# Patient Record
Sex: Male | Born: 1935
Health system: Southern US, Community
[De-identification: ages and names within clinical notes are randomized; demographics above are authoritative.]

## PROBLEM LIST (undated history)

## (undated) ENCOUNTER — Emergency Department (HOSPITAL_COMMUNITY): Admission: EM | Payer: No Typology Code available for payment source

## (undated) DIAGNOSIS — D649 Anemia, unspecified: Secondary | ICD-10-CM

## (undated) DIAGNOSIS — Z9289 Personal history of other medical treatment: Secondary | ICD-10-CM

## (undated) DIAGNOSIS — I10 Essential (primary) hypertension: Secondary | ICD-10-CM

## (undated) DIAGNOSIS — I739 Peripheral vascular disease, unspecified: Secondary | ICD-10-CM

## (undated) DIAGNOSIS — I2510 Atherosclerotic heart disease of native coronary artery without angina pectoris: Secondary | ICD-10-CM

## (undated) DIAGNOSIS — J301 Allergic rhinitis due to pollen: Secondary | ICD-10-CM

## (undated) DIAGNOSIS — R519 Headache, unspecified: Secondary | ICD-10-CM

## (undated) DIAGNOSIS — R51 Headache: Secondary | ICD-10-CM

## (undated) DIAGNOSIS — E785 Hyperlipidemia, unspecified: Secondary | ICD-10-CM

## (undated) DIAGNOSIS — I6529 Occlusion and stenosis of unspecified carotid artery: Secondary | ICD-10-CM

## (undated) DIAGNOSIS — G43909 Migraine, unspecified, not intractable, without status migrainosus: Secondary | ICD-10-CM

## (undated) DIAGNOSIS — H409 Unspecified glaucoma: Secondary | ICD-10-CM

## (undated) DIAGNOSIS — F1021 Alcohol dependence, in remission: Secondary | ICD-10-CM

## (undated) DIAGNOSIS — B019 Varicella without complication: Secondary | ICD-10-CM

## (undated) DIAGNOSIS — E119 Type 2 diabetes mellitus without complications: Secondary | ICD-10-CM

## (undated) DIAGNOSIS — E039 Hypothyroidism, unspecified: Secondary | ICD-10-CM

## (undated) DIAGNOSIS — N189 Chronic kidney disease, unspecified: Secondary | ICD-10-CM

## (undated) HISTORY — DX: Varicella without complication: B01.9

## (undated) HISTORY — DX: Hyperlipidemia, unspecified: E78.5

## (undated) HISTORY — DX: Migraine, unspecified, not intractable, without status migrainosus: G43.909

## (undated) HISTORY — DX: Essential (primary) hypertension: I10

## (undated) HISTORY — DX: Headache, unspecified: R51.9

## (undated) HISTORY — DX: Allergic rhinitis due to pollen: J30.1

## (undated) HISTORY — DX: Personal history of other medical treatment: Z92.89

## (undated) HISTORY — DX: Headache: R51

## (undated) HISTORY — DX: Unspecified glaucoma: H40.9

## (undated) HISTORY — DX: Occlusion and stenosis of unspecified carotid artery: I65.29

## (undated) HISTORY — DX: Alcohol dependence, in remission: F10.21

## (undated) HISTORY — PX: CHOLECYSTECTOMY, LAPAROSCOPIC: SHX56

---
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
## 1997-11-18 ENCOUNTER — Emergency Department (HOSPITAL_COMMUNITY): Admission: EM | Admit: 1997-11-18 | Discharge: 1997-11-18 | Payer: Self-pay | Admitting: Emergency Medicine

## 1998-06-20 ENCOUNTER — Emergency Department (HOSPITAL_COMMUNITY): Admission: EM | Admit: 1998-06-20 | Discharge: 1998-06-20 | Payer: Self-pay | Admitting: Emergency Medicine

## 1998-06-20 ENCOUNTER — Inpatient Hospital Stay (HOSPITAL_COMMUNITY): Admission: EM | Admit: 1998-06-20 | Discharge: 1998-06-22 | Payer: Self-pay | Admitting: Emergency Medicine

## 1998-06-20 ENCOUNTER — Encounter: Payer: Self-pay | Admitting: Emergency Medicine

## 2012-11-02 DIAGNOSIS — R059 Cough, unspecified: Secondary | ICD-10-CM | POA: Diagnosis not present

## 2012-11-02 DIAGNOSIS — R05 Cough: Secondary | ICD-10-CM | POA: Diagnosis not present

## 2012-11-02 DIAGNOSIS — R0989 Other specified symptoms and signs involving the circulatory and respiratory systems: Secondary | ICD-10-CM | POA: Diagnosis not present

## 2012-11-05 DIAGNOSIS — S1093XA Contusion of unspecified part of neck, initial encounter: Secondary | ICD-10-CM | POA: Diagnosis not present

## 2012-11-05 DIAGNOSIS — IMO0002 Reserved for concepts with insufficient information to code with codable children: Secondary | ICD-10-CM | POA: Diagnosis not present

## 2012-11-05 DIAGNOSIS — S0083XA Contusion of other part of head, initial encounter: Secondary | ICD-10-CM | POA: Diagnosis not present

## 2012-11-05 DIAGNOSIS — S0003XA Contusion of scalp, initial encounter: Secondary | ICD-10-CM | POA: Diagnosis not present

## 2012-11-05 DIAGNOSIS — H8110 Benign paroxysmal vertigo, unspecified ear: Secondary | ICD-10-CM | POA: Diagnosis not present

## 2014-01-11 ENCOUNTER — Encounter: Payer: Self-pay | Admitting: Physician Assistant

## 2014-01-11 ENCOUNTER — Ambulatory Visit (INDEPENDENT_AMBULATORY_CARE_PROVIDER_SITE_OTHER): Payer: Medicare Other | Admitting: Physician Assistant

## 2014-01-11 VITALS — BP 152/70 | HR 88 | Temp 97.5°F | Resp 16 | Ht 65.75 in | Wt 154.2 lb

## 2014-01-11 DIAGNOSIS — Z7689 Persons encountering health services in other specified circumstances: Secondary | ICD-10-CM | POA: Insufficient documentation

## 2014-01-11 DIAGNOSIS — Z7189 Other specified counseling: Secondary | ICD-10-CM

## 2014-01-11 DIAGNOSIS — E785 Hyperlipidemia, unspecified: Secondary | ICD-10-CM

## 2014-01-11 DIAGNOSIS — I10 Essential (primary) hypertension: Secondary | ICD-10-CM | POA: Diagnosis not present

## 2014-01-11 NOTE — Assessment & Plan Note (Addendum)
Initial BP in clinic elevated at 173/82.  Asymptomatic. Repeat BP at end of visit at ---.  Patient currently on a BP supplement that he states his company manufactures.  Refuses Rx medication despite risk for stroke and heart attack.  Is aware this is AMA.

## 2014-01-11 NOTE — Progress Notes (Signed)
   Patient presents to clinic today to establish care.  Patient with history of hypertension and hyperlipidemia controlled with diet, exercise and supplementation.  BP elevated at 173/82 in clinic.  Asymptomatic.  Repeat BP 152/70.  Patient states that he does not need to check his cholesterol. His BP is always good and he will not take prescription medication.  Past Medical History  Diagnosis Date  . HTN (hypertension)   . Hyperlipidemia   . Hyperlipidemia   . Chicken pox   . Frequent headaches   . Glaucoma   . Hay fever   . Migraines   . History of blood transfusion   . 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Past Surgical History  Procedure Laterality Date  . Cholecystectomy, laparoscopic    . Tonsillectomy  1942    No current outpatient prescriptions on file prior to visit.   No current facility-administered medications on file prior to visit.    Allergies  Allergen Reactions  . Onion Other (See Comments)    GI Issues    No family history on file.  History   Social History  . Marital Status: Married    Spouse Name: N/A    Number of Children: N/A  . Years of Education: N/A   Occupational History  . Not on file.   Social History Main Topics  . Smoking status: Former Smoker    Start date: 04/01/1968    Quit date: 01/11/1977  . Smokeless tobacco: Never Used  . Alcohol Use: No  . Drug Use: No  . Sexual Activity: No   Other Topics Concern  . Not on file   Social History Narrative  . No narrative on file   ROS See HPI.  All other ROS are negative.  BP 152/70  Pulse 88  Temp(Src) 97.5 F (36.4 C) (Oral)  Resp 16  Ht 5' 5.75" (1.67 m)  Wt 154 lb 4 oz (69.967 kg)  BMI 25.09 kg/m2  SpO2 98%  Physical Exam  Vitals reviewed. Constitutional: He is oriented to person, place, and time and well-developed, well-nourished, and in no distress.  HENT:  Head: Normocephalic and atraumatic.  Eyes: Conjunctivae are normal.  Neck: Neck supple. No thyromegaly  present.  Cardiovascular: Normal rate, regular rhythm, normal heart sounds and intact distal pulses.   Pulmonary/Chest: Effort normal and breath sounds normal. No respiratory distress. He has no wheezes. He has no rales. He exhibits no tenderness.  Lymphadenopathy:    He has no cervical adenopathy.  Neurological: He is alert and oriented to person, place, and time.  Skin: Skin is warm and dry. No rash noted.  Psychiatric: Affect normal.   Assessment/Plan: Essential hypertension Initial BP in clinic elevated at 173/82.  Asymptomatic. Repeat BP at end of visit at ---.  Patient currently on a BP supplement that he states his company manufactures.  Refuses Rx medication despite risk for stroke and heart attack.  Is aware this is AMA.  Encounter to establish care Medical History reviewed and updated.  Patient on multiple supplements at present.  Supplements are made by his own companies. Refuses Rx medication or therapies despite history of hypertension, hyperlipidemia and glaucoma.  Patient declines immunizations -- TDap, Influenza, Zostavax or Pneumococcal.  Overdue for colonoscopy -- Patient refuses. Is up-to-date on dental care.  Hyperlipidemia Patient endorses history.  Is not taking Rx therapy. Refuses Rx medications.

## 2014-01-11 NOTE — Patient Instructions (Signed)
It was pleasure to participate in your care today. Please return to the office for a Complete Physical Examination.  Reconsider immunizations and Colonoscopy.  Colorectal Cancer Screening Colorectal cancer screening is done to detect early disease. Colorectal refers to the colon and rectum. The colon and rectum are located at the end of the large intestine (digestive system), and carry your bowel movements out of the body. Screening may be done even if you are not experiencing symptoms.  Colorectal cancer screening checks for:  Polyps. These are small growths in the lining of the colon that can turn cancerous.  Cancer that is already growing. Cancer is a cluster of abnormal cells that can cause problems in the body. REASONS FOR COLORECTAL CANCER SCREENING  It is common for polyps to form in the lining of the colon, especially in older people. These polyps can be cancerous or become cancerous.  Caught early, colorectal cancer is treatable.  Cancer can be life threatening. Detecting or preventing cancer early can save your life and allow you to enjoy life longer. TYPES OF SCREENING  Fecal occult blood testing. A stool sample is examined for blood in the laboratory.  Sigmoidoscopy. A sigmoidoscope is used to examine the rectum and lower colon. A sigmoidoscope is a flexible tube with a camera that is inserted through your anus to examine your lower rectum.  Colonoscopy. The longer colonoscope is used to examine the entire colon. A colonoscope is also a thin, flexible tube with a camera. This test examines the colon and rectum. Other tests include:  Digital rectal exam.  Barium enema.  Stool DNA test.  Virtual colonoscopy is the use of computerized X-ray scan (computed tomography, CT) to take X-ray images of your colon. WHO SHOULD HAVE COLORECTAL CANCER SCREENING?  Screening is recommended for all adults aged 21 to 9 years.  Screening is generally done every 5 to 10 years or more  frequently if you have a family history or symptoms.  Screening is rarely recommended in adults aged 71 to 39 years. Screening is not recommended in adults aged 12 years and older. Your caregiver may recommend screening at a younger age and more frequent screening if you have:  A history of colorectal cancer or polyps.  Family members with histories of colorectal cancer or polyps.  Inflammatory bowel disease, such as ulcerative colitis or Crohn's disease.  A type of hereditary colon cancer syndrome. Talk with your caregiver about any symptoms, personal and family history. SYMPTOMS OF COLORECTAL CANCER It is important to discuss the following symptoms with your caregiver. These symptoms may be the result of other conditions and may be easily treated:  Rectal bleeding.  Blood in your stool.  Changes in bowel movements (hard or loose stools). These changes may last several weeks.  Abdominal cramping.  Feeling the pressure to have a bowel movement when there is no bowel movement.  Feeling tired or weak.  Unexplained weight loss.  Unexplained low red blood cell count. This may also be called iron deficiency anemia. HOME CARE INSTRUCTIONS   Follow up with your caregiver as directed.  Follow all instructions for preparation before your test as well as after. PREVENTION  Following healthy lifestyle habits each day can reduce your chance of getting colorectal cancer and many other types of cancer:  Eat a healthy, well-balanced diet rich in fruits and vegetables and low in fats, sugars and cholesterol.  Stay active. Try to exercise at least 4 to 6 times per week for 30 minutes.  Maintain a healthy weight. Ask your caregiver what a healthy weight range is for you.  Women should only drink 1 alcoholic drink per day. Men should only drink 2 alcoholic drinks per day.  Quit smoking. SEEK MEDICAL CARE IF:   You experience abdominal or rectal symptoms (see Symptoms of Colorectal  Cancer).  Your gastrointestinal issues (constipation, diarrhea) do not go away as expected.  You have questions or concerns. FOR MORE INFORMATION  American Academy of Family Physicians www.familydoctor.org  Centers for Disease Control and Prevention http://www.wolf.info/  [place] Preventive Services Task Force www.uspreventiveservicestaskforce.[place] www.cancer.org MAKE SURE YOU:   Understand these instructions.  Will watch your condition.  Will get help right away if you are not doing well or get worse. Always follow up with your caregiver to find out the results of your tests. Not all test results may be available during your visit. If your test results are not back during the visit, make an appointment with your caregiver to find out the results. Do not assume everything is normal if you have not heard from your caregiver or the medical facility. It is important for you to follow up on all of your test results.  Document Released: 09/05/2009 Document Revised: 06/10/2011 Document Reviewed: 06/24/2013 [facility] Patient Information 2015 [place], [place]. This information is not intended to replace advice given to you by your health care provider. Make sure you discuss any questions you have with your health care provider.

## 2014-01-11 NOTE — Assessment & Plan Note (Addendum)
Medical History reviewed and updated.  Patient on multiple supplements at present.  Supplements are made by his own companies. Refuses Rx medication or therapies despite history of hypertension, hyperlipidemia and glaucoma.  Patient declines immunizations -- TDap, Influenza, Zostavax or Pneumococcal.  Overdue for colonoscopy -- Patient refuses. Is up-to-date on dental care.

## 2014-01-11 NOTE — Progress Notes (Signed)
Pre visit review using our clinic review tool, if applicable. No additional management support is needed unless otherwise documented below in the visit note/SLS  

## 2014-01-11 NOTE — Assessment & Plan Note (Signed)
Patient endorses history.  Is not taking Rx therapy. Refuses Rx medications.

## 2014-05-19 DIAGNOSIS — H4321 Crystalline deposits in vitreous body, right eye: Secondary | ICD-10-CM | POA: Diagnosis not present

## 2014-05-19 DIAGNOSIS — H40053 Ocular hypertension, bilateral: Secondary | ICD-10-CM | POA: Diagnosis not present

## 2014-05-19 DIAGNOSIS — H2513 Age-related nuclear cataract, bilateral: Secondary | ICD-10-CM | POA: Diagnosis not present

## 2014-06-30 ENCOUNTER — Ambulatory Visit (INDEPENDENT_AMBULATORY_CARE_PROVIDER_SITE_OTHER): Payer: Medicare Other | Admitting: Medical

## 2014-06-30 ENCOUNTER — Encounter: Payer: Self-pay | Admitting: Medical

## 2014-06-30 VITALS — BP 158/77 | HR 100 | Temp 98.1°F | Ht 65.75 in | Wt 159.2 lb

## 2014-06-30 DIAGNOSIS — J209 Acute bronchitis, unspecified: Secondary | ICD-10-CM | POA: Diagnosis not present

## 2014-06-30 MED ORDER — MOMETASONE FUROATE 50 MCG/ACT NA SUSP: NASAL | Status: DC

## 2014-06-30 MED ORDER — BENZONATATE 100 MG PO CAPS: 100.0000 mg | ORAL_CAPSULE | Freq: Three times a day (TID) | ORAL | Status: DC | PRN

## 2014-06-30 MED ORDER — AZITHROMYCIN 250 MG PO TABS: ORAL_TABLET | ORAL | Status: DC

## 2014-06-30 NOTE — Assessment & Plan Note (Signed)
You appear to have bronchitis. Rest hydrate and tylenol for fever. I am prescribing cough medicine benzonatate, and azithomcyin antibiotic. For your nasal congestion rx of nasonex  You should gradually get better. If not then notify us and would recommend a chest xray.  Follow up in 7-10 days or as needed

## 2014-06-30 NOTE — Patient Instructions (Signed)
Acute bronchitis You appear to have bronchitis. Rest hydrate and tylenol for fever. I am prescribing cough medicine benzonatate, and azithomcyin antibiotic. For your nasal congestion rx of nasonex  You should gradually get better. If not then notify us and would recommend a chest xray.  Follow up in 7-10 days or as needed

## 2014-06-30 NOTE — Progress Notes (Signed)
Pre visit review using our clinic review tool, if applicable. No additional management support is needed unless otherwise documented below in the visit note. 

## 2014-06-30 NOTE — Progress Notes (Signed)
Subjective:    Patient ID: John Walton, male    DOB: 08-04-1932, 81 y.o.   MRN: [id number]  HPI  Pt states he got mild cough about a week ago but it won't go away. Not severe. It is productive. No fever, no chills or sweats. 2 of his grand kids have been sick. No sinus pressure. No wheezing. Pt is not smoker. Stopped smoking 1970.    Review of Systems  Constitutional: Negative for fever, chills and fatigue.  HENT: Positive for congestion.   Respiratory: Positive for cough. Negative for chest tightness, shortness of breath and wheezing.   Cardiovascular: Negative for chest pain and palpitations.  Musculoskeletal: Negative for back pain.  Neurological: Negative for dizziness, syncope, weakness, numbness and headaches.  Hematological: Negative for adenopathy. Does not bruise/bleed easily.  Psychiatric/Behavioral: Negative for behavioral problems and confusion.   Past Medical History  Diagnosis Date  . HTN (hypertension)   . Hyperlipidemia   . Hyperlipidemia   . Chicken pox   . Frequent headaches   . Glaucoma   . Hay fever   . Migraines   . History of blood transfusion   . 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History   Social History  . Marital Status: Married    Spouse Name: N/A  . Number of Children: N/A  . Years of Education: N/A   Occupational History  . Not on file.   Social History Main Topics  . Smoking status: Former Smoker    Start date: 04/01/1968    Quit date: 01/11/1977  . Smokeless tobacco: Never Used  . Alcohol Use: No  . Drug Use: No  . Sexual Activity: No   Other Topics Concern  . Not on file   Social History Narrative    Past Surgical History  Procedure Laterality Date  . Cholecystectomy, laparoscopic    . Tonsillectomy  1942    No family history on file.  Allergies  Allergen Reactions  . Onion Other (See Comments)    GI Issues    Current Outpatient Prescriptions on File Prior to Visit  Medication Sig Dispense Refill  .  NON FORMULARY Take 2-4 capsules by mouth 2 (two) times daily. PROSTATE THERAPY    . NON FORMULARY Take 2 capsules by mouth daily. 8-INGREDIENT THERAPY: BP, STRENGTH IMMUNITY    . NON FORMULARY Take 1 capsule by mouth daily. NERVE TONIC-A: STRESS, COGNITIVE FUNCTION    . NON FORMULARY Apply topically as directed. BPC HEADACHE THERAPY     No current facility-administered medications on file prior to visit.    BP 158/77 mmHg  Pulse 100  Temp(Src) 98.1 F (36.7 C) (Oral)  Ht 5' 5.75" (1.67 m)  Wt 159 lb 3.2 oz (72.213 kg)  BMI 25.89 kg/m2  SpO2 97%      Objective:   Physical Exam  General  Mental Status - Alert. General Appearance - Well groomed. Not in acute distress.  Skin Rashes- No Rashes.  HEENT Head- Normal. Ear Auditory Canal - Left- Normal. Right - Normal.Tympanic Membrane- Left- Normal. Right- Normal. Eye Sclera/Conjunctiva- Left- Normal. Right- Normal. Nose & Sinuses Nasal Mucosa- Left-  Faint boggy +  Congested. Right-  Faint  boggy + Congested. No sinus pressure Mouth & Throat Lips: Upper Lip- Normal: no dryness, cracking, pallor, cyanosis, or vesicular eruption. Lower Lip-Normal: no dryness, cracking, pallor, cyanosis or vesicular eruption. Buccal Mucosa- Bilateral- No Aphthous ulcers. Oropharynx- No Discharge or Erythema. Tonsils: Characteristics- Bilateral- No Erythema or Congestion. Size/Enlargement-  Bilateral- No enlargement. Discharge- bilateral-None.  Neck Neck- Supple. No Masses.   Chest and Lung Exam Auscultation: Breath Sounds:- even and unlabored, but bilateral faint  upper lobe rhonchi.  Cardiovascular Auscultation:Rythm- Regular, rate and rhythm. Murmurs & Other Heart Sounds:Ausculatation of the heart reveal- No Murmurs.  Lymphatic Head & Neck General Head & Neck Lymphatics: Bilateral: Description- No Localized lymphadenopathy.       Assessment & Plan:

## 2014-07-08 ENCOUNTER — Telehealth: Payer: Self-pay

## 2014-07-08 ENCOUNTER — Encounter: Payer: Medicare Other | Admitting: Physician Assistant

## 2014-07-11 ENCOUNTER — Ambulatory Visit (INDEPENDENT_AMBULATORY_CARE_PROVIDER_SITE_OTHER): Payer: Medicare Other | Admitting: Physician Assistant

## 2014-07-11 ENCOUNTER — Encounter: Payer: Self-pay | Admitting: Physician Assistant

## 2014-07-11 VITALS — BP 152/72 | HR 94 | Temp 97.7°F | Resp 16 | Ht 65.75 in | Wt 157.4 lb

## 2014-07-11 DIAGNOSIS — Z125 Encounter for screening for malignant neoplasm of prostate: Secondary | ICD-10-CM | POA: Diagnosis not present

## 2014-07-11 DIAGNOSIS — I10 Essential (primary) hypertension: Secondary | ICD-10-CM

## 2014-07-11 DIAGNOSIS — R9431 Abnormal electrocardiogram [ECG] [EKG]: Secondary | ICD-10-CM | POA: Diagnosis not present

## 2014-07-11 DIAGNOSIS — Z136 Encounter for screening for cardiovascular disorders: Secondary | ICD-10-CM

## 2014-07-11 DIAGNOSIS — Z0000 Encounter for general adult medical examination without abnormal findings: Secondary | ICD-10-CM | POA: Diagnosis not present

## 2014-07-11 DIAGNOSIS — E785 Hyperlipidemia, unspecified: Secondary | ICD-10-CM

## 2014-07-11 LAB — COMPREHENSIVE METABOLIC PANEL
ALT: 12 U/L (ref 0–53)
AST: 14 U/L (ref 0–37)
Albumin: 3.9 g/dL (ref 3.5–5.2)
Alkaline Phosphatase: 76 U/L (ref 39–117)
BUN: 22 mg/dL (ref 6–23)
CO2: 26 meq/L (ref 19–32)
Calcium: 9.6 mg/dL (ref 8.4–10.5)
Chloride: 105 mEq/L (ref 96–112)
Creatinine, Ser: 1.16 mg/dL (ref 0.40–1.50)
GFR: 64.43 mL/min (ref >60.00)
Glucose, Bld: 229 mg/dL — ABNORMAL HIGH (ref 70–99)
Potassium: 4.3 mEq/L (ref 3.5–5.1)
Sodium: 140 mEq/L (ref 135–145)
Total Bilirubin: 0.5 mg/dL (ref 0.2–1.2)
Total Protein: 6.8 g/dL (ref 6.0–8.3)

## 2014-07-11 LAB — CBC
HCT: 35.4 % — ABNORMAL LOW (ref 39.0–52.0)
Hemoglobin: 12.3 g/dL — ABNORMAL LOW (ref 13.0–17.0)
MCHC: 34.6 g/dL (ref 30.0–36.0)
MCV: 80.8 fl (ref 78.0–100.0)
Platelets: 304 10*3/uL (ref 150.0–400.0)
RBC: 4.38 Mil/uL (ref 4.22–5.81)
RDW: 13.5 % (ref 11.5–15.5)
WBC: 5.6 10*3/uL (ref 4.0–10.5)

## 2014-07-11 LAB — LIPID PANEL
Cholesterol: 227 mg/dL — ABNORMAL HIGH (ref 0–200)
HDL: 37.2 mg/dL — AB (ref >39.00)
NONHDL: 189.8
Total CHOL/HDL Ratio: 6
Triglycerides: 265 mg/dL — ABNORMAL HIGH (ref 0.0–149.0)
VLDL: 53 mg/dL — AB (ref 0.0–40.0)

## 2014-07-11 LAB — LDL CHOLESTEROL, DIRECT: LDL DIRECT: 137 mg/dL

## 2014-07-11 LAB — PSA: PSA: 13.1 ng/mL — AB (ref 0.10–4.00)

## 2014-07-11 NOTE — Progress Notes (Signed)
Subjective:    John Walton is a 81 y.o. male who presents for Medicare Annual/Subsequent preventive examination.   Preventive Screening-Counseling & Management  Tobacco History  Smoking status  . Former Smoker  . Start date: 04/01/1968  . Quit date: 01/11/1977  Smokeless tobacco  . Never Used    Problems Prior to Visit 1. Hypertension -- Long-standing history. Patient denies chest pain, palpitations, lightheadedness, dizziness, vision changes or frequent headaches.  Patient previously refused BP medications.  2. Hyperlipidemia -- Patient endorsed. No current medication regimen. Is fasting for labs.  Current Problems (verified) Patient Active Problem List   Diagnosis Date Noted  . Acute bronchitis 06/30/2014  . Essential hypertension 01/11/2014  . Encounter to establish care 01/11/2014  . Hyperlipidemia 01/11/2014    Medications Prior to Visit Current Outpatient Prescriptions on File Prior to Visit  Medication Sig Dispense Refill  . NON FORMULARY Take 2-4 capsules by mouth 2 (two) times daily. PROSTATE THERAPY    . NON FORMULARY Take 2 capsules by mouth daily. 8-INGREDIENT THERAPY: BP, STRENGTH IMMUNITY    . NON FORMULARY Take 1 capsule by mouth daily. NERVE TONIC-A: STRESS, COGNITIVE FUNCTION    . NON FORMULARY Apply topically as directed. BPC HEADACHE THERAPY     No current facility-administered medications on file prior to visit.    Current Medications (verified) Current Outpatient Prescriptions  Medication Sig Dispense Refill  . NON FORMULARY Take 2-4 capsules by mouth 2 (two) times daily. PROSTATE THERAPY    . NON FORMULARY Take 2 capsules by mouth daily. 8-INGREDIENT THERAPY: BP, STRENGTH IMMUNITY    . NON FORMULARY Take 1 capsule by mouth daily. NERVE TONIC-A: STRESS, COGNITIVE FUNCTION    . NON FORMULARY Apply topically as directed. BPC HEADACHE THERAPY     No current facility-administered medications for this visit.     Allergies (verified) Review of  patient's allergies indicates no known allergies.   PAST HISTORY  Family History No family history on file.  Social History History  Substance Use Topics  . Smoking status: Former Smoker    Start date: 04/01/1968    Quit date: 01/11/1977  . Smokeless tobacco: Never Used  . Alcohol Use: No    Are there smokers in your home (other than you)?  No  Risk Factors Current exercise habits: Home exercise routine includes calisthenics and Cardio 1 hour daily.  Dietary issues discussed: Well-balanced diet.   Cardiac risk factors: advanced age (older than 43 for men, 12 for women), dyslipidemia, hypertension and male gender.  Depression Screen (Note: if answer to either of the following is "Yes", a more complete depression screening is indicated)   Q1: Over the past two weeks, have you felt down, depressed or hopeless? No  Q2: Over the past two weeks, have you felt little interest or pleasure in doing things? No  Have you lost interest or pleasure in daily life? No  Do you often feel hopeless? No  Do you cry easily over simple problems? No  Activities of Daily Living In your present state of health, do you have any difficulty performing the following activities?:  Driving? No Managing money?  No Feeding yourself? No Getting from bed to chair? No Climbing a flight of stairs? No Preparing food and eating?: No Bathing or showering? No Getting dressed: No Getting to the toilet? No Using the toilet:No Moving around from place to place: No In the past year have you fallen or had a near fall?:No   Are you sexually active?  Yes  Do you have more than one partner?  No  Hearing Difficulties: No Do you often ask people to speak up or repeat themselves? No Do you experience ringing or noises in your ears? No Do you have difficulty understanding soft or whispered voices? No   Do you feel that you have a problem with memory? No  Do you often misplace items? No  Do you feel safe at  home?  Yes  Cognitive Testing  Alert? Yes  Normal Appearance?Yes  Oriented to person? Yes  Place? Yes   Time? Yes  Recall of three objects?  Yes  Can perform simple calculations? Yes  Displays appropriate judgment?Yes  Can read the correct time from a watch face?Yes   Advanced Directives have been discussed with the patient? Yes   List the Names of Other Physician/Practitioners you currently use: 1.  Dr Corinna Capra -- Optometry  Indicate any recent Medical Services you may have received from other than [facility] providers in the past year (date may be approximate).   There is no immunization history on file for this patient.  Screening Tests Health Maintenance  Topic Date Due  . TETANUS/TDAP  10/18/1953  . COLONOSCOPY  10/18/1984  . ZOSTAVAX  10/19/1994  . PNA vac Low Risk Adult (1 of 2 - PCV13) 10/19/1999  . INFLUENZA VACCINE  10/31/2014    All answers were reviewed with the patient and necessary referrals were made:  Leeanne Rio, PA-C   07/11/2014   History reviewed: allergies, current medications, past family history, past medical history, past social history, past surgical history and problem list  Review of Systems A comprehensive review of systems was negative.    Objective:     Vision by Snellen chart: right eye:20/40, left eye:20/50 Blood pressure 152/72, pulse 94, temperature 97.7 F (36.5 C), temperature source Oral, resp. rate 16, height 5' 5.75" (1.67 m), weight 157 lb 6 oz (71.385 kg), SpO2 98 %. Body mass index is 25.6 kg/(m^2).  BP 152/72 mmHg  Pulse 94  Temp(Src) 97.7 F (36.5 C) (Oral)  Resp 16  Ht 5' 5.75" (1.67 m)  Wt 157 lb 6 oz (71.385 kg)  BMI 25.60 kg/m2  SpO2 98%   General appearance: alert, cooperative, appears stated age and no distress Head: Normocephalic, without obvious abnormality, atraumatic Eyes: conjunctivae/corneas clear. PERRL, EOM's intact. Fundi benign. Ears: normal TM's and external ear canals both ears Nose: Nares  normal. Septum midline. Mucosa normal. No drainage or sinus tenderness. Throat: lips, mucosa, and tongue normal; teeth and gums normal Lungs: clear to auscultation bilaterally Chest wall: no tenderness Heart: regular rate and rhythm, S1, S2 normal, no murmur, click, rub or gallop Abdomen: soft, non-tender; bowel sounds normal; no masses,  no organomegaly Skin: Skin color, texture, turgor normal. No rashes or lesions Patient defers DRE.      Assessment:     Medicare Wellness, Subsequent  Screening for Ischemic Heart Disease Screening for Prostate Cancer Hypertension Abnormal EKG      Plan:     (1) During the course of the visit the patient was educated and counseled about appropriate screening and preventive services including:    Pneumococcal vaccine   Influenza vaccine  Td vaccine  Screening electrocardiogram  Prostate cancer screening  Colorectal cancer screening  Diabetes screening  Nutrition counseling   Diet review for nutrition referral? Yes ____  Not Indicated _x_  (2) Will obtain EKG and fasting lipid panel today. (3) Will obtain PSA today after risks vs benefits  discussed. (4) BP above goal.  Lifestyle measures discussed. Refuses medication despite risk for CVA or MI.  (5) Bifascicular RBB.  Asymptomatic.  Referral to Cardiology placed.  Patient Instructions (the written plan) was given to the patient.  Medicare Attestation I have personally reviewed: The patient's medical and social history Their use of alcohol, tobacco or illicit drugs Their current medications and supplements The patient's functional ability including ADLs,fall risks, home safety risks, cognitive, and hearing and visual impairment Diet and physical activities Evidence for depression or mood disorders  The patient's weight, height, BMI, and visual acuity have been recorded in the chart.  I have made referrals, counseling, and provided education to the patient based on review of  the above and I have provided the patient with a written personalized care plan for preventive services.     Raiford Noble [place], [place]   07/11/2014

## 2014-07-11 NOTE — Progress Notes (Signed)
Pre visit review using our clinic review tool, if applicable. No additional management support is needed unless otherwise documented below in the visit note/SLS  

## 2014-07-11 NOTE — Patient Instructions (Signed)
Please go to the lab for blood work. I will call you with your results.  Please reconsider having a screening colonoscopy, or at least letting us give you a home kit.  You will be contacted by cardiology for an appointment. It is very important you attend this.  Follow-up will be based on lab results.  Preventive Care for Adults A healthy lifestyle and preventive care can promote health and wellness. Preventive health guidelines for men include the following key practices:  A routine yearly physical is a good way to check with your health care provider about your health and preventative screening. It is a chance to share any concerns and updates on your health and to receive a thorough exam.  Visit your dentist for a routine exam and preventative care every 6 months. Brush your teeth twice a day and floss once a day. Good oral hygiene prevents tooth decay and gum disease.  The frequency of eye exams is based on your age, health, family medical history, use of contact lenses, and other factors. Follow your health care provider's recommendations for frequency of eye exams.  Eat a healthy diet. Foods such as vegetables, fruits, whole grains, low-fat dairy products, and lean protein foods contain the nutrients you need without too many calories. Decrease your intake of foods high in solid fats, added sugars, and salt. Eat the right amount of calories for you.Get information about a proper diet from your health care provider, if necessary.  Regular physical exercise is one of the most important things you can do for your health. Most adults should get at least 150 minutes of moderate-intensity exercise (any activity that increases your heart rate and causes you to sweat) each week. In addition, most adults need muscle-strengthening exercises on 2 or more days a week.  Maintain a healthy weight. The body mass index (BMI) is a screening tool to identify possible weight problems. It provides an  estimate of body fat based on height and weight. Your health care provider can find your BMI and can help you achieve or maintain a healthy weight.For adults 20 years and older:  A BMI below 18.5 is considered underweight.  A BMI of 18.5 to 24.9 is normal.  A BMI of 25 to 29.9 is considered overweight.  A BMI of 30 and above is considered obese.  Maintain normal blood lipids and cholesterol levels by exercising and minimizing your intake of saturated fat. Eat a balanced diet with plenty of fruit and vegetables. Blood tests for lipids and cholesterol should begin at age 61 and be repeated every 5 years. If your lipid or cholesterol levels are high, you are over 50, or you are at high risk for heart disease, you may need your cholesterol levels checked more frequently.Ongoing high lipid and cholesterol levels should be treated with medicines if diet and exercise are not working.  If you smoke, find out from your health care provider how to quit. If you do not use tobacco, do not start.  Lung cancer screening is recommended for adults aged 58-80 years who are at high risk for developing lung cancer because of a history of smoking. A yearly low-dose CT scan of the lungs is recommended for people who have at least a 30-pack-year history of smoking and are a current smoker or have quit within the past 15 years. A pack year of smoking is smoking an average of 1 pack of cigarettes a day for 1 year (for example: 1 pack a day for  30 years or 2 packs a day for 15 years). Yearly screening should continue until the smoker has stopped smoking for at least 15 years. Yearly screening should be stopped for people who develop a health problem that would prevent them from having lung cancer treatment.  If you choose to drink alcohol, do not have more than 2 drinks per day. One drink is considered to be 12 ounces (355 mL) of beer, 5 ounces (148 mL) of wine, or 1.5 ounces (44 mL) of liquor.  Avoid use of street  drugs. Do not share needles with anyone. Ask for help if you need support or instructions about stopping the use of drugs.  High blood pressure causes heart disease and increases the risk of stroke. Your blood pressure should be checked at least every 1-2 years. Ongoing high blood pressure should be treated with medicines, if weight loss and exercise are not effective.  If you are 47-10 years old, ask your health care provider if you should take aspirin to prevent heart disease.  Diabetes screening involves taking a blood sample to check your fasting blood sugar level. This should be done once every 3 years, after age 66, if you are within normal weight and without risk factors for diabetes. Testing should be considered at a younger age or be carried out more frequently if you are overweight and have at least 1 risk factor for diabetes.  Colorectal cancer can be detected and often prevented. Most routine colorectal cancer screening begins at the age of 60 and continues through age 43. However, your health care provider may recommend screening at an earlier age if you have risk factors for colon cancer. On a yearly basis, your health care provider may provide home test kits to check for hidden blood in the stool. Use of a small camera at the end of a tube to directly examine the colon (sigmoidoscopy or colonoscopy) can detect the earliest forms of colorectal cancer. Talk to your health care provider about this at age 72, when routine screening begins. Direct exam of the colon should be repeated every 5-10 years through age 30, unless early forms of precancerous polyps or small growths are found.  People who are at an increased risk for hepatitis B should be screened for this virus. You are considered at high risk for hepatitis B if:  You were born in a country where hepatitis B occurs often. Talk with your health care provider about which countries are considered high risk.  Your parents were born in a  high-risk country and you have not received a shot to protect against hepatitis B (hepatitis B vaccine).  You have HIV or AIDS.  You use needles to inject street drugs.  You live with, or have sex with, someone who has hepatitis B.  You are a man who has sex with other men (MSM).  You get hemodialysis treatment.  You take certain medicines for conditions such as cancer, organ transplantation, and autoimmune conditions.  Hepatitis C blood testing is recommended for all people born from [birth year] through 1965 and any individual with known risks for hepatitis C.  Practice safe sex. Use condoms and avoid high-risk sexual practices to reduce the spread of sexually transmitted infections (STIs). STIs include gonorrhea, chlamydia, syphilis, trichomonas, herpes, HPV, and human immunodeficiency virus (HIV). Herpes, HIV, and HPV are viral illnesses that have no cure. They can result in disability, cancer, and death.  If you are at risk of being infected with HIV, it is  recommended that you take a prescription medicine daily to prevent HIV infection. This is called preexposure prophylaxis (PrEP). You are considered at risk if:  You are a man who has sex with other men (MSM) and have other risk factors.  You are a heterosexual man, are sexually active, and are at increased risk for HIV infection.  You take drugs by injection.  You are sexually active with a partner who has HIV.  Talk with your health care provider about whether you are at high risk of being infected with HIV. If you choose to begin PrEP, you should first be tested for HIV. You should then be tested every 3 months for as long as you are taking PrEP.  A one-time screening for abdominal aortic aneurysm (AAA) and surgical repair of large AAAs by ultrasound are recommended for men ages 75 to 66 years who are current or former smokers.  Healthy men should no longer receive prostate-specific antigen (PSA) blood tests as part of routine  cancer screening. Talk with your health care provider about prostate cancer screening.  Testicular cancer screening is not recommended for adult males who have no symptoms. Screening includes self-exam, a health care provider exam, and other screening tests. Consult with your health care provider about any symptoms you have or any concerns you have about testicular cancer.  Use sunscreen. Apply sunscreen liberally and repeatedly throughout the day. You should seek shade when your shadow is shorter than you. Protect yourself by wearing long sleeves, pants, a wide-brimmed hat, and sunglasses year round, whenever you are outdoors.  Once a month, do a whole-body skin exam, using a mirror to look at the skin on your back. Tell your health care provider about new moles, moles that have irregular borders, moles that are larger than a pencil eraser, or moles that have changed in shape or color.  Stay current with required vaccines (immunizations).  Influenza vaccine. All adults should be immunized every year.  Tetanus, diphtheria, and acellular pertussis (Td, Tdap) vaccine. An adult who has not previously received Tdap or who does not know his vaccine status should receive 1 dose of Tdap. This initial dose should be followed by tetanus and diphtheria toxoids (Td) booster doses every 10 years. Adults with an unknown or incomplete history of completing a 3-dose immunization series with Td-containing vaccines should begin or complete a primary immunization series including a Tdap dose. Adults should receive a Td booster every 10 years.  Varicella vaccine. An adult without evidence of immunity to varicella should receive 2 doses or a second dose if he has previously received 1 dose.  Human papillomavirus (HPV) vaccine. Males aged 16-21 years who have not received the vaccine previously should receive the 3-dose series. Males aged 22-26 years may be immunized. Immunization is recommended through the age of 41  years for any male who has sex with males and did not get any or all doses earlier. Immunization is recommended for any person with an immunocompromised condition through the age of 57 years if he did not get any or all doses earlier. During the 3-dose series, the second dose should be obtained 4-8 weeks after the first dose. The third dose should be obtained 24 weeks after the first dose and 16 weeks after the second dose.  Zoster vaccine. One dose is recommended for adults aged 67 years or older unless certain conditions are present.  Measles, mumps, and rubella (MMR) vaccine. Adults born before [birth year] generally are considered immune to measles and  mumps. Adults born in [birth year] or later should have 1 or more doses of MMR vaccine unless there is a contraindication to the vaccine or there is laboratory evidence of immunity to each of the three diseases. A routine second dose of MMR vaccine should be obtained at least 28 days after the first dose for students attending postsecondary schools, health care workers, or international travelers. People who received inactivated measles vaccine or an unknown type of measles vaccine during 1963-1967 should receive 2 doses of MMR vaccine. People who received inactivated mumps vaccine or an unknown type of mumps vaccine before 1979 and are at high risk for mumps infection should consider immunization with 2 doses of MMR vaccine. Unvaccinated health care workers born before [birth year] who lack laboratory evidence of measles, mumps, or rubella immunity or laboratory confirmation of disease should consider measles and mumps immunization with 2 doses of MMR vaccine or rubella immunization with 1 dose of MMR vaccine.  Pneumococcal 13-valent conjugate (PCV13) vaccine. When indicated, a person who is uncertain of his immunization history and has no record of immunization should receive the PCV13 vaccine. An adult aged 45 years or older who has certain medical conditions and has not been  previously immunized should receive 1 dose of PCV13 vaccine. This PCV13 should be followed with a dose of pneumococcal polysaccharide (PPSV23) vaccine. The PPSV23 vaccine dose should be obtained at least 8 weeks after the dose of PCV13 vaccine. An adult aged 1 years or older who has certain medical conditions and previously received 1 or more doses of PPSV23 vaccine should receive 1 dose of PCV13. The PCV13 vaccine dose should be obtained 1 or more years after the last PPSV23 vaccine dose.  Pneumococcal polysaccharide (PPSV23) vaccine. When PCV13 is also indicated, PCV13 should be obtained first. All adults aged 28 years and older should be immunized. An adult younger than age 32 years who has certain medical conditions should be immunized. Any person who resides in a nursing home or long-term care facility should be immunized. An adult smoker should be immunized. People with an immunocompromised condition and certain other conditions should receive both PCV13 and PPSV23 vaccines. People with human immunodeficiency virus (HIV) infection should be immunized as soon as possible after diagnosis. Immunization during chemotherapy or radiation therapy should be avoided. Routine use of PPSV23 vaccine is not recommended for American Indians, [place] Natives, or people younger than 65 years unless there are medical conditions that require PPSV23 vaccine. When indicated, people who have unknown immunization and have no record of immunization should receive PPSV23 vaccine. One-time revaccination 5 years after the first dose of PPSV23 is recommended for people aged 19-64 years who have chronic kidney failure, nephrotic syndrome, asplenia, or immunocompromised conditions. People who received 1-2 doses of PPSV23 before age 30 years should receive another dose of PPSV23 vaccine at age 34 years or later if at least 5 years have passed since the previous dose. Doses of PPSV23 are not needed for people immunized with PPSV23 at or  after age 80 years.  Meningococcal vaccine. Adults with asplenia or persistent complement component deficiencies should receive 2 doses of quadrivalent meningococcal conjugate (MenACWY-D) vaccine. The doses should be obtained at least 2 months apart. Microbiologists working with certain meningococcal bacteria, [employer] recruits, people at risk during an outbreak, and people who travel to or live in countries with a high rate of meningitis should be immunized. A first-year college student up through age 86 years who is living in a residence hall should  receive a dose if he did not receive a dose on or after his 16th birthday. Adults who have certain high-risk conditions should receive one or more doses of vaccine.  Hepatitis A vaccine. Adults who wish to be protected from this disease, have certain high-risk conditions, work with hepatitis A-infected animals, work in hepatitis A research labs, or travel to or work in countries with a high rate of hepatitis A should be immunized. Adults who were previously unvaccinated and who anticipate close contact with an international adoptee during the first 60 days after arrival in the [place] from a country with a high rate of hepatitis A should be immunized.  Hepatitis B vaccine. Adults should be immunized if they wish to be protected from this disease, have certain high-risk conditions, may be exposed to blood or other infectious body fluids, are household contacts or sex partners of hepatitis B positive people, are clients or workers in certain care facilities, or travel to or work in countries with a high rate of hepatitis B.  Haemophilus influenzae type b (Hib) vaccine. A previously unvaccinated person with asplenia or sickle cell disease or having a scheduled splenectomy should receive 1 dose of Hib vaccine. Regardless of previous immunization, a recipient of a hematopoietic stem cell transplant should receive a 3-dose series 6-12 months after his  successful transplant. Hib vaccine is not recommended for adults with HIV infection. Preventive Service / Frequency Ages 39 to 89  Blood pressure check.** / Every 1 to 2 years.  Lipid and cholesterol check.** / Every 5 years beginning at age 28.  Hepatitis C blood test.** / For any individual with known risks for hepatitis C.  Skin self-exam. / Monthly.  Influenza vaccine. / Every year.  Tetanus, diphtheria, and acellular pertussis (Tdap, Td) vaccine.** / Consult your health care provider. 1 dose of Td every 10 years.  Varicella vaccine.** / Consult your health care provider.  HPV vaccine. / 3 doses over 6 months, if 48 or younger.  Measles, mumps, rubella (MMR) vaccine.** / You need at least 1 dose of MMR if you were born in 1957 or later. You may also need a second dose.  Pneumococcal 13-valent conjugate (PCV13) vaccine.** / Consult your health care provider.  Pneumococcal polysaccharide (PPSV23) vaccine.** / 1 to 2 doses if you smoke cigarettes or if you have certain conditions.  Meningococcal vaccine.** / 1 dose if you are age 74 to 42 years and a Market researcher living in a residence hall, or have one of several medical conditions. You may also need additional booster doses.  Hepatitis A vaccine.** / Consult your health care provider.  Hepatitis B vaccine.** / Consult your health care provider.  Haemophilus influenzae type b (Hib) vaccine.** / Consult your health care provider. Ages 1 to 8  Blood pressure check.** / Every 1 to 2 years.  Lipid and cholesterol check.** / Every 5 years beginning at age 47.  Lung cancer screening. / Every year if you are aged 11-80 years and have a 30-pack-year history of smoking and currently smoke or have quit within the past 15 years. Yearly screening is stopped once you have quit smoking for at least 15 years or develop a health problem that would prevent you from having lung cancer treatment.  Fecal occult blood test (FOBT)  of stool. / Every year beginning at age 70 and continuing until age 81. You may not have to do this test if you get a colonoscopy every 10 years.  Flexible sigmoidoscopy**  or colonoscopy.** / Every 5 years for a flexible sigmoidoscopy or every 10 years for a colonoscopy beginning at age 34 and continuing until age 59.  Hepatitis C blood test.** / For all people born from [birth year] through 1963 and any individual with known risks for hepatitis C.  Skin self-exam. / Monthly.  Influenza vaccine. / Every year.  Tetanus, diphtheria, and acellular pertussis (Tdap/Td) vaccine.** / Consult your health care provider. 1 dose of Td every 10 years.  Varicella vaccine.** / Consult your health care provider.  Zoster vaccine.** / 1 dose for adults aged 68 years or older.  Measles, mumps, rubella (MMR) vaccine.** / You need at least 1 dose of MMR if you were born in 1957 or later. You may also need a second dose.  Pneumococcal 13-valent conjugate (PCV13) vaccine.** / Consult your health care provider.  Pneumococcal polysaccharide (PPSV23) vaccine.** / 1 to 2 doses if you smoke cigarettes or if you have certain conditions.  Meningococcal vaccine.** / Consult your health care provider.  Hepatitis A vaccine.** / Consult your health care provider.  Hepatitis B vaccine.** / Consult your health care provider.  Haemophilus influenzae type b (Hib) vaccine.** / Consult your health care provider. Ages 58 and over  Blood pressure check.** / Every 1 to 2 years.  Lipid and cholesterol check.**/ Every 5 years beginning at age 76.  Lung cancer screening. / Every year if you are aged 3-80 years and have a 30-pack-year history of smoking and currently smoke or have quit within the past 15 years. Yearly screening is stopped once you have quit smoking for at least 15 years or develop a health problem that would prevent you from having lung cancer treatment.  Fecal occult blood test (FOBT) of stool. / Every year  beginning at age 70 and continuing until age 45. You may not have to do this test if you get a colonoscopy every 10 years.  Flexible sigmoidoscopy** or colonoscopy.** / Every 5 years for a flexible sigmoidoscopy or every 10 years for a colonoscopy beginning at age 42 and continuing until age 68.  Hepatitis C blood test.** / For all people born from [birth year] through 1963 and any individual with known risks for hepatitis C.  Abdominal aortic aneurysm (AAA) screening.** / A one-time screening for ages 109 to 59 years who are current or former smokers.  Skin self-exam. / Monthly.  Influenza vaccine. / Every year.  Tetanus, diphtheria, and acellular pertussis (Tdap/Td) vaccine.** / 1 dose of Td every 10 years.  Varicella vaccine.** / Consult your health care provider.  Zoster vaccine.** / 1 dose for adults aged 76 years or older.  Pneumococcal 13-valent conjugate (PCV13) vaccine.** / Consult your health care provider.  Pneumococcal polysaccharide (PPSV23) vaccine.** / 1 dose for all adults aged 52 years and older.  Meningococcal vaccine.** / Consult your health care provider.  Hepatitis A vaccine.** / Consult your health care provider.  Hepatitis B vaccine.** / Consult your health care provider.  Haemophilus influenzae type b (Hib) vaccine.** / Consult your health care provider. **Family history and personal history of risk and conditions may change your health care provider's recommendations. Document Released: 05/14/2001 Document Revised: 03/23/2013 Document Reviewed: 08/13/2010 [facility] Patient Information 2015 [place], [place]. This information is not intended to replace advice given to you by your health care provider. Make sure you discuss any questions you have with your health care provider.

## 2014-07-12 LAB — URINALYSIS, ROUTINE W REFLEX MICROSCOPIC
Bilirubin Urine: NEGATIVE
Hgb urine dipstick: NEGATIVE
Ketones, ur: NEGATIVE
Nitrite: NEGATIVE
Total Protein, Urine: 100 — AB
URINE GLUCOSE: 500 — AB
UROBILINOGEN UA: 0.2 (ref 0.0–1.0)
pH: 5.5 (ref 5.0–8.0)

## 2014-07-14 ENCOUNTER — Other Ambulatory Visit (INDEPENDENT_AMBULATORY_CARE_PROVIDER_SITE_OTHER): Payer: Medicare Other

## 2014-07-14 DIAGNOSIS — E119 Type 2 diabetes mellitus without complications: Secondary | ICD-10-CM | POA: Diagnosis not present

## 2014-07-14 LAB — HEMOGLOBIN A1C: HEMOGLOBIN A1C: 9.7 % — AB (ref 4.6–6.5)

## 2014-07-15 ENCOUNTER — Telehealth: Payer: Self-pay | Admitting: *Deleted

## 2014-07-15 NOTE — Telephone Encounter (Signed)
Pt dropped off application for walking disability privileges. Forwarded to [place]. JG//CMA

## 2014-07-18 NOTE — Telephone Encounter (Signed)
John Walton would like for pt to come in for an OV to discuss this paperwork. This was not discussed at last OV. Can you please call and schedule? Thanks. JG//CMA

## 2014-07-18 NOTE — Telephone Encounter (Signed)
Left message for patient to return my call.

## 2014-07-19 ENCOUNTER — Encounter: Payer: Self-pay | Admitting: Physician Assistant

## 2014-07-19 NOTE — Telephone Encounter (Signed)
Left message for patient to return my call.

## 2014-07-20 ENCOUNTER — Telehealth: Payer: Self-pay | Admitting: Physician Assistant

## 2014-07-20 NOTE — Telephone Encounter (Signed)
Patient dismissed from [facility] by Raiford Noble, PA-C effective July 19, 2014. Dismissal letter sent out by certified/ registered mail on July 21, 2014.

## 2014-07-25 NOTE — Telephone Encounter (Addendum)
Received signed domestic return receipt on July 25, 2014 verifying delivery of certified letter on July 22, 2014. Article number [id number]. [place]

## 2014-08-01 NOTE — Telephone Encounter (Signed)
Error

## 2014-08-01 NOTE — Telephone Encounter (Signed)
Form returned to pt via mail (to his home address). Form was not filled out by [employer]. Pt has been dismissed.

## 2014-08-16 ENCOUNTER — Telehealth: Payer: Self-pay | Admitting: Physician Assistant

## 2014-08-16 NOTE — Telephone Encounter (Signed)
Notified pt wife that we are not able to see him. She states she may send a letter to Dr. Birdie Riddle explaining what happened previously as pt did not want to take chemicals and preferred to alter diet regarding health concerns.  Midge Minium, MD  Margot Ables     Phone Number: [phone]            Since pt has been dismissed from this office, we are not able to see him.       Previous Messages     ----- Message -----   From: Margot Ables   Sent: 08/16/2014 12:02 PM    To: Brunetta Jeans, PA-C, Midge Minium, MD  Subject: New Pt to Dr. Birdie Riddle               Pt wife, Teodoro Kil seeing Dr. Birdie Riddle, called to see if she could refer her husband as a patient. Alert on account that Mr. Popelka was dismissed from [facility] in April 2016 by Einar Pheasant. I did not inform wife of any information on pts account. I explained that it was provider discretion and I would request review from Dr. Birdie Riddle on accepting him as a patient. Please contact Opal Sidles at [phone].

## 2014-08-19 ENCOUNTER — Ambulatory Visit: Payer: Medicare Other | Admitting: Internal Medicine

## 2014-09-02 ENCOUNTER — Encounter: Payer: Self-pay | Admitting: Internal Medicine

## 2014-09-02 ENCOUNTER — Ambulatory Visit (INDEPENDENT_AMBULATORY_CARE_PROVIDER_SITE_OTHER): Payer: Medicare Other | Admitting: Internal Medicine

## 2014-09-02 VITALS — BP 138/90 | Ht 66.0 in | Wt 152.3 lb

## 2014-09-02 DIAGNOSIS — Z136 Encounter for screening for cardiovascular disorders: Secondary | ICD-10-CM | POA: Diagnosis not present

## 2014-09-02 DIAGNOSIS — I4510 Unspecified right bundle-branch block: Secondary | ICD-10-CM | POA: Diagnosis not present

## 2014-09-02 DIAGNOSIS — E785 Hyperlipidemia, unspecified: Secondary | ICD-10-CM

## 2014-09-02 DIAGNOSIS — I10 Essential (primary) hypertension: Secondary | ICD-10-CM | POA: Diagnosis not present

## 2014-09-02 NOTE — Progress Notes (Signed)
OFFICE NOTE  Chief Complaint:  Abnormal EKG  Primary Care Physician: Leeanne Rio, PA-C  HPI:  COSIMO DOCTOR is a pleasant 81 year old male who is kindly referred to me for evaluation of an abnormal EKG. He was found to have a right bundle branch block during a well person visit. This prompted a referral for evaluation. He denies any chest pain or shortness of breath. He reports being very physically active. He strongly involved in herbal medications and does not take any prescription medications currently. He has no clear medical problems other than he tells me he had a history of a gallbladder removal in the past. He also was told he had diabetes in the past and recently had diabetes again but is trying to manage it with diet. He had a remote history of high blood pressure and abnormal cholesterol but has not had testing recently.  PMHx:  Past Medical History  Diagnosis Date  . HTN (hypertension)   . Hyperlipidemia   . Hyperlipidemia   . Chicken pox   . Frequent headaches   . Glaucoma   . Hay fever   . Migraines   . History of blood transfusion   . 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Past Surgical History  Procedure Laterality Date  . Cholecystectomy, laparoscopic    . Tonsillectomy  1942    FAMHx:  Family History  Problem Relation Age of Onset  . Liver disease Mother   . Skin cancer Father   . Skin cancer Paternal Grandfather     SOCHx:   reports that he quit smoking about 37 years ago. He started smoking about 46 years ago. He has never used smokeless tobacco. He reports that he does not drink alcohol or use illicit drugs.  ALLERGIES:  No Known Allergies  ROS: A comprehensive review of systems was negative.  HOME MEDS: Current Outpatient Prescriptions  Medication Sig Dispense Refill  . NON FORMULARY Take 2-4 capsules by mouth 2 (two) times daily. PROSTATE THERAPY    . NON FORMULARY Take 2 capsules by mouth daily. 8-INGREDIENT THERAPY: BP,  STRENGTH IMMUNITY    . NON FORMULARY Take 1 capsule by mouth daily. NERVE TONIC-A: STRESS, COGNITIVE FUNCTION    . NON FORMULARY Apply topically as directed. [facility] HEADACHE THERAPY     No current facility-administered medications for this visit.    LABS/IMAGING: No results found for this or any previous visit (from the past 48 hour(s)). No results found.  WEIGHTS: Wt Readings from Last 3 Encounters:  09/02/14 152 lb 4.8 oz (69.083 kg)  07/11/14 157 lb 6 oz (71.385 kg)  06/30/14 159 lb 3.2 oz (72.213 kg)    VITALS: BP 138/90 mmHg  Ht 5\' 6"  (1.676 m)  Wt 152 lb 4.8 oz (69.083 kg)  BMI 24.59 kg/m2  EXAM: General appearance: alert and no distress Neck: no carotid bruit, no JVD and thyroid not enlarged, symmetric, no tenderness/mass/nodules Lungs: clear to auscultation bilaterally Heart: regular rate and rhythm, S1, S2 normal, no murmur, click, rub or gallop Abdomen: soft, non-tender; bowel sounds normal; no masses,  no organomegaly Extremities: extremities normal, atraumatic, no cyanosis or edema Pulses: 2+ and symmetric Skin: Skin color, texture, turgor normal. No rashes or lesions Neurologic: Grossly normal Psych: Pleasant  EKG: Normal sinus rhythm at 94, right bundle branch block  ASSESSMENT: 1. Right bundle-branch block  PLAN: 1.   Mr. Kellen has EKG changes consistent with right bundle branch block. Apparently this is a new  finding he tells me from a prior EKG. He has no chest pain or shortness of breath. He seems to be physically active although he does mostly upper body exercises including resistance training. He apparently injured himself by falling into a hole underneath the snow bank and has never been able to walk right since then. I told him that typically we would recommend some type of stress testing to evaluate for possible coronary artery disease, especially in a 81 year old male which could be a cause of his right bundle branch block. He politely declined that at  this time. 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 He would like to come back in September however and have an EKG again to see if more exercise, dietary changes and weight loss will cause any changes to his EKG.  I think that is highly unlikely and did inform him of that.  Plan to see him back in 3-4 months. Thanks for the kind referral.  Pixie Casino, MD, [facility] Attending Cardiologist [place] 09/02/2014, 5:58 PM

## 2014-09-02 NOTE — Patient Instructions (Signed)
Your physician recommends that you schedule a follow-up appointment in: 3 months with Dr. Hilty.  

## 2014-09-05 ENCOUNTER — Encounter: Payer: Self-pay | Admitting: *Deleted

## 2014-09-26 ENCOUNTER — Other Ambulatory Visit: Payer: Self-pay

## 2015-01-14 ENCOUNTER — Emergency Department (HOSPITAL_COMMUNITY)
Admission: EM | Admit: 2015-01-14 | Discharge: 2015-01-14 | Disposition: A | Discharge status: Home / Self Care | Payer: Medicare Other | Attending: Emergency Medicine | Admitting: Emergency Medicine

## 2015-01-14 ENCOUNTER — Encounter (HOSPITAL_COMMUNITY): Payer: Self-pay | Admitting: Emergency Medicine

## 2015-01-14 ENCOUNTER — Emergency Department (HOSPITAL_COMMUNITY): Payer: Medicare Other

## 2015-01-14 DIAGNOSIS — I10 Essential (primary) hypertension: Secondary | ICD-10-CM | POA: Diagnosis not present

## 2015-01-14 DIAGNOSIS — S0121XA Laceration without foreign body of nose, initial encounter: Secondary | ICD-10-CM | POA: Diagnosis not present

## 2015-01-14 DIAGNOSIS — Z8639 Personal history of other endocrine, nutritional and metabolic disease: Secondary | ICD-10-CM | POA: Diagnosis not present

## 2015-01-14 DIAGNOSIS — W1839XA Other fall on same level, initial encounter: Secondary | ICD-10-CM | POA: Diagnosis not present

## 2015-01-14 DIAGNOSIS — Y9389 Activity, other specified: Secondary | ICD-10-CM | POA: Insufficient documentation

## 2015-01-14 DIAGNOSIS — Z87891 Personal history of nicotine dependence: Secondary | ICD-10-CM | POA: Diagnosis not present

## 2015-01-14 DIAGNOSIS — H409 Unspecified glaucoma: Secondary | ICD-10-CM | POA: Diagnosis not present

## 2015-01-14 DIAGNOSIS — R569 Unspecified convulsions: Secondary | ICD-10-CM | POA: Diagnosis present

## 2015-01-14 DIAGNOSIS — Z8679 Personal history of other diseases of the circulatory system: Secondary | ICD-10-CM | POA: Insufficient documentation

## 2015-01-14 DIAGNOSIS — R55 Syncope and collapse: Secondary | ICD-10-CM

## 2015-01-14 DIAGNOSIS — Z8619 Personal history of other infectious and parasitic diseases: Secondary | ICD-10-CM | POA: Diagnosis not present

## 2015-01-14 DIAGNOSIS — Y998 Other external cause status: Secondary | ICD-10-CM | POA: Diagnosis not present

## 2015-01-14 DIAGNOSIS — S0990XA Unspecified injury of head, initial encounter: Secondary | ICD-10-CM | POA: Diagnosis not present

## 2015-01-14 DIAGNOSIS — S0181XA Laceration without foreign body of other part of head, initial encounter: Secondary | ICD-10-CM | POA: Diagnosis not present

## 2015-01-14 DIAGNOSIS — Y9289 Other specified places as the place of occurrence of the external cause: Secondary | ICD-10-CM | POA: Diagnosis not present

## 2015-01-14 DIAGNOSIS — N289 Disorder of kidney and ureter, unspecified: Secondary | ICD-10-CM | POA: Insufficient documentation

## 2015-01-14 DIAGNOSIS — S199XXA Unspecified injury of neck, initial encounter: Secondary | ICD-10-CM | POA: Diagnosis not present

## 2015-01-14 LAB — CBC WITH DIFFERENTIAL/PLATELET
BASOS ABS: 0 10*3/uL (ref 0.0–0.1)
Basophils Relative: 0 %
Eosinophils Absolute: 0 10*3/uL (ref 0.0–0.7)
Eosinophils Relative: 1 %
HEMATOCRIT: 34.6 % — AB (ref 39.0–52.0)
Hemoglobin: 11.5 g/dL — ABNORMAL LOW (ref 13.0–17.0)
LYMPHS ABS: 0.8 10*3/uL (ref 0.7–4.0)
LYMPHS PCT: 10 %
MCH: 28 pg (ref 26.0–34.0)
MCHC: 33.2 g/dL (ref 30.0–36.0)
MCV: 84.4 fL (ref 78.0–100.0)
MONO ABS: 0.7 10*3/uL (ref 0.1–1.0)
Monocytes Relative: 9 %
NEUTROS ABS: 6.8 10*3/uL (ref 1.7–7.7)
Neutrophils Relative %: 80 %
Platelets: 230 10*3/uL (ref 150–400)
RBC: 4.1 MIL/uL — AB (ref 4.22–5.81)
RDW: 13.3 % (ref 11.5–15.5)
WBC: 8.4 10*3/uL (ref 4.0–10.5)

## 2015-01-14 LAB — COMPREHENSIVE METABOLIC PANEL
ALBUMIN: 3.9 g/dL (ref 3.5–5.0)
ALT: 22 U/L (ref 17–63)
ANION GAP: 9 (ref 5–15)
AST: 30 U/L (ref 15–41)
Alkaline Phosphatase: 83 U/L (ref 38–126)
BILIRUBIN TOTAL: 0.6 mg/dL (ref 0.3–1.2)
BUN: 35 mg/dL — ABNORMAL HIGH (ref 6–20)
CHLORIDE: 105 mmol/L (ref 101–111)
CO2: 24 mmol/L (ref 22–32)
Calcium: 9 mg/dL (ref 8.9–10.3)
Creatinine, Ser: 2.28 mg/dL — ABNORMAL HIGH (ref 0.61–1.24)
GFR calc Af Amer: 29 mL/min — ABNORMAL LOW (ref >60)
GFR calc non Af Amer: 25 mL/min — ABNORMAL LOW (ref >60)
GLUCOSE: 191 mg/dL — AB (ref 65–99)
POTASSIUM: 5.4 mmol/L — AB (ref 3.5–5.1)
Sodium: 138 mmol/L (ref 135–145)
TOTAL PROTEIN: 7.1 g/dL (ref 6.5–8.1)

## 2015-01-14 MED ORDER — SODIUM CHLORIDE 0.9 % IV BOLUS (SEPSIS)
500.0000 mL | Freq: Once | INTRAVENOUS | Status: AC
  Administered 2015-01-14: 500 mL via INTRAVENOUS

## 2015-01-14 NOTE — ED Provider Notes (Signed)
CSN: [id number]     Arrival date & time 01/14/15  1344 History   First MD Initiated Contact with Patient 01/14/15 1354     Chief Complaint  Patient presents with  . Seizures     (Consider location/radiation/quality/duration/timing/severity/associated sxs/prior Treatment) HPI Comments: Patient is an 81 year old male with history of hypertension and high blood sugar, both treated with diet and exercise. He presents today for evaluation of syncope. He was at the [employer] today and was standing for prolonged period of time when he began to feel dizzy, fell backward, and struck the back of his head. He was reported to be "clammy" initially and somewhat incoherent. EMS was called and was transported here for evaluation. Shortly after the paramedics arrived, they reported a 10 second shaking episode and the patient became incontinent of urine. He reports mild headache, but denies other injury in the fall. He states that he otherwise feels well now and prior to the incident. He feels as though the cause of this was the fact that he did not have breakfast this morning and had not eaten all day.  Patient is a 81 y.o. male presenting with syncope. The history is provided by the patient.  Loss of Consciousness Episode history:  Single Duration:  2 minutes Timing:  Constant Progression:  Resolved Chronicity:  New Witnessed: yes   Relieved by:  Nothing Worsened by:  Nothing tried Ineffective treatments:  None tried Associated symptoms: seizures     Past Medical History  Diagnosis Date  . HTN (hypertension)   . Hyperlipidemia   . Hyperlipidemia   . Chicken pox   . Frequent headaches   . Glaucoma   . Hay fever   . Migraines   . History of blood transfusion   . 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[facility])    Past Surgical History  Procedure Laterality Date  . Cholecystectomy, laparoscopic    . Tonsillectomy  1942   Family History  Problem Relation Age of Onset  . Liver disease Mother    . Skin cancer Father   . Skin cancer Paternal Grandfather   . Diabetes Maternal Grandfather    Social History  Substance Use Topics  . Smoking status: Former Smoker    Start date: 04/01/1968    Quit date: 01/11/1977  . Smokeless tobacco: Never Used  . Alcohol Use: No    Review of Systems  Cardiovascular: Positive for syncope.  Neurological: Positive for seizures.  All other systems reviewed and are negative.     Allergies  Review of patient's allergies indicates no known allergies.  Home Medications   Prior to Admission medications   Medication Sig Start Date End Date Taking? Authorizing Provider  NON FORMULARY Take 2-4 capsules by mouth 2 (two) times daily. PROSTATE THERAPY    Historical Provider, MD  NON FORMULARY Take 2 capsules by mouth daily. 8-INGREDIENT THERAPY: BP, STRENGTH IMMUNITY    Historical Provider, MD  NON FORMULARY Take 1 capsule by mouth daily. NERVE TONIC-A: STRESS, COGNITIVE FUNCTION    Historical Provider, MD  NON FORMULARY Apply topically as directed. [facility] HEADACHE THERAPY    Historical Provider, MD   BP 139/57 mmHg  Pulse 77  Temp(Src) 97.4 F (36.3 C) (Oral)  Resp 18  SpO2 99% Physical Exam  Constitutional: He is oriented to person, place, and time. He appears well-developed and well-nourished. No distress.  HENT:  Head: Normocephalic.  Mouth/Throat: Oropharynx is clear and moist.  There is a superficial laceration to the left forehead, which  is well approximated and in no need of repair.  Eyes: EOM are normal. Pupils are equal, round, and reactive to light.  Neck:  The neck is immobilized in a cervical collar. There is no significant tenderness. There are no step-offs.  Cardiovascular: Normal rate, regular rhythm and normal heart sounds.   No murmur heard. Pulmonary/Chest: Effort normal and breath sounds normal. No respiratory distress. He has no wheezes. He has no rales.  Abdominal: Soft. Bowel sounds are normal. He exhibits no distension.  There is no tenderness.  Musculoskeletal: Normal range of motion. He exhibits no edema.  Neurological: He is alert and oriented to person, place, and time. No cranial nerve deficit. He exhibits normal muscle tone. Coordination normal.  Skin: Skin is warm and dry. He is not diaphoretic.  Nursing note and vitals reviewed.   ED Course  Procedures (including critical care time) Labs Review Labs Reviewed  COMPREHENSIVE METABOLIC PANEL  CBC WITH DIFFERENTIAL/PLATELET    Imaging Review No results found. I have personally reviewed and evaluated these images and lab results as part of my medical decision-making.   EKG Interpretation   Date/Time:  Saturday January 14 2015 13:50:24 EDT Ventricular Rate:  77 PR Interval:  176 QRS Duration: 143 QT Interval:  451 QTC Calculation: 510 R Axis:   34 Text Interpretation:  Sinus rhythm Right bundle branch block Confirmed by  Anzley Dibbern  MD, Brynden Thune ([id number]) on 01/14/2015 1:56:48 PM      MDM   Final diagnoses:  None    Patient is an 81 year old male brought her by EMS after an apparent syncopal episode at the [employer] followed by possible seizure activity. He is now neurologically intact, at his baseline, and has no complaints. His workup reveals a negative head CT, negative cervical spine, and laboratory studies which are unremarkable with the exception of a mild bump in his creatinine. He may well have become vasovagal and could potentially be slightly dehydrated as he reports not eating or drinking today. He will be given 500 mL of normal saline, then discharged.  I have spoken to this patient regarding admission, however he adamantly refuses and tells me that he "feels fine". He has a history of medical noncompliance and was recently dismissed from his primary doctor's practice for this. He will allow me to administer fluids prior to his being discharged.    Veryl Speak, MD 01/14/15 [phone]

## 2015-01-14 NOTE — ED Notes (Signed)
Per EMS called out to unconscious after witness seizure.  Patient fell from a standing position and hit head per witness.  Upon EMS arrival patient paramedic witnessed 10 second seizure where patient was incontinent of urine.  Patient in c-collar from EMS.  18g IV in left AC from EMS.  CBG 157, patient is hard of hearing, hears best in left ear.

## 2015-01-14 NOTE — ED Notes (Signed)
Wife states that when the patient fell unconscious his skin became very pale.

## 2015-01-14 NOTE — ED Notes (Signed)
Pt. At CT.

## 2015-01-14 NOTE — Discharge Instructions (Signed)
Follow-up with a primary care doctor.   Syncope Syncope is a medical term for fainting or passing out. This means you lose consciousness and drop to the ground. People are generally unconscious for less than 5 minutes. You may have some muscle twitches for up to 15 seconds before waking up and returning to normal. Syncope occurs more often in older adults, but it can happen to anyone. While most causes of syncope are not dangerous, syncope can be a sign of a serious medical problem. It is important to seek medical care.  CAUSES  Syncope is caused by a sudden drop in blood flow to the brain. The specific cause is often not determined. Factors that can bring on syncope include:  Taking medicines that lower blood pressure.  Sudden changes in posture, such as standing up quickly.  Taking more medicine than prescribed.  Standing in one place for too long.  Seizure disorders.  Dehydration and excessive exposure to heat.  Low blood sugar (hypoglycemia).  Straining to have a bowel movement.  Heart disease, irregular heartbeat, or other circulatory problems.  Fear, emotional distress, seeing blood, or severe pain. SYMPTOMS  Right before fainting, you may:  Feel dizzy or light-headed.  Feel nauseous.  See all white or all black in your field of vision.  Have cold, clammy skin. DIAGNOSIS  Your health care provider will ask about your symptoms, perform a physical exam, and perform an electrocardiogram (ECG) to record the electrical activity of your heart. Your health care provider may also perform other heart or blood tests to determine the cause of your syncope which may include:  Transthoracic echocardiogram (TTE). During echocardiography, sound waves are used to evaluate how blood flows through your heart.  Transesophageal echocardiogram (TEE).  Cardiac monitoring. This allows your health care provider to monitor your heart rate and rhythm in real time.  Holter monitor. This is a  portable device that records your heartbeat and can help diagnose heart arrhythmias. It allows your health care provider to track your heart activity for several days, if needed.  Stress tests by exercise or by giving medicine that makes the heart beat faster. TREATMENT  In most cases, no treatment is needed. Depending on the cause of your syncope, your health care provider may recommend changing or stopping some of your medicines. HOME CARE INSTRUCTIONS  Have someone stay with you until you feel stable.  Do not drive, use machinery, or play sports until your health care provider says it is okay.  Keep all follow-up appointments as directed by your health care provider.  Lie down right away if you start feeling like you might faint. Breathe deeply and steadily. Wait until all the symptoms have passed.  Drink enough fluids to keep your urine clear or pale yellow.  If you are taking blood pressure or heart medicine, get up slowly and take several minutes to sit and then stand. This can reduce dizziness. SEEK IMMEDIATE MEDICAL CARE IF:   You have a severe headache.  You have unusual pain in the chest, abdomen, or back.  You are bleeding from your mouth or rectum, or you have black or tarry stool.  You have an irregular or very fast heartbeat.  You have pain with breathing.  You have repeated fainting or seizure-like jerking during an episode.  You faint when sitting or lying down.  You have confusion.  You have trouble walking.  You have severe weakness.  You have vision problems. If you fainted, call your local  emergency services (911 in U.S.). Do not drive yourself to the hospital.    This information is not intended to replace advice given to you by your health care provider. Make sure you discuss any questions you have with your health care provider.   Document Released: 03/18/2005 Document Revised: 08/02/2014 Document Reviewed: 05/17/2011 Elsevier Interactive Patient  Education 2016 [employer].    Emergency Department Resource Guide 1) Find a Doctor and Pay Out of Pocket Although you won't have to find out who is covered by your insurance plan, it is a good idea to ask around and get recommendations. You will then need to call the office and see if the doctor you have chosen will accept you as a new patient and what types of options they offer for patients who are self-pay. Some doctors offer discounts or will set up payment plans for their patients who do not have insurance, but you will need to ask so you aren't surprised when you get to your appointment.  2) Contact Your Local Health Department Not all health departments have doctors that can see patients for sick visits, but many do, so it is worth a call to see if yours does. If you don't know where your local health department is, you can check in your phone book. The CDC also has a tool to help you locate your state's health department, and many state websites also have listings of all of their local health departments.  3) Find a [facility] If your illness is not likely to be very severe or complicated, you may want to try a walk in clinic. These are popping up all over the country in pharmacies, drugstores, and shopping centers. They're usually staffed by nurse practitioners or physician assistants that have been trained to treat common illnesses and complaints. They're usually fairly quick and inexpensive. However, if you have serious medical issues or chronic medical problems, these are probably not your best option.  No Primary Care Doctor: - Call Health Connect at  [phone] - they can help you locate a primary care doctor that  accepts your insurance, provides certain services, etc. - Physician Referral Service- [phone]  Chronic Pain Problems: Organization         Address  Phone   Notes  [facility]  [phone] Patients need to be referred by their primary  care doctor.   Medication Assistance: Organization         Address  Phone   Notes  [facility] Medication [facility] [place] [place], [place] [address] [phone] --Must be a resident of [facility] -- Must have NO insurance coverage whatsoever (no Medicaid/ Medicare, etc.) -- The pt. MUST have a primary care doctor that directs their care regularly and follows them in the community   [facility]  [phone]   [employer]  [phone]    Agencies that provide inexpensive medical care: Organization         Address  Phone   Notes  [place]  [phone]   [facility]    [phone]   [facility] [place], [place] [address] [phone]   [place] [address] [place] [phone]   Planned Parenthood    [phone]   [facility]    [phone]   [place],  [place] Phone:  [phone], Fax:  [phone] Hours of Operation:  9 am - 6 pm, M-F.  Also accepts Medicaid/Medicare and self-pay.  [facility] for [place] [facility], [address], [place] Phone: [phone], Fax: [phone]. Hours of Operation:  8:30 am - 5:30 pm, M-F.  Also accepts Medicaid and self-pay.  [facility] [place] [address] [place] Phone: [phone]   [place], [place], [place] [phone], Ext. [phone] Mondays & Thursdays: 7-9 AM.  First 15 patients are seen on a first come, first serve basis.    [place] Providers:  Organization         Address  Phone   Notes  [facility] [address], [place] [phone] Also accepts self-pay patients.  [facility] [id number] [place], [place]  [phone]   [place], [address], [place] [phone]   [facility] [address], [place] [phone]   Lucianne Lei [address], [place]   [phone] Only accepts [place] Access [place] patients after they have their name applied to their card.   Self-Pay (no insurance) in [facility]:  Organization         Address  Phone   Notes  Sickle Cell Patients, [facility] [place] [phone]   [facility] [place] [phone]   [facility] [place]  [place], [place], [place] [phone]   [facility]/Dr. Osei-Bonsu  [address] [place] or [address], [place] [phone] Phone number for both [place] and [place] locations is the same.  [facility]gent [facility] and [facility] [address] [place] [phone]   [facility] [address] [place] or [address] [phone]   [facility] [address], [place] [phone], phone; [phone], fax Sees patients 1st and 3rd Saturday of every month.  Must not qualify for public or private insurance (i.e. Medicaid, Medicare, [place] Health Choice, Veterans' Benefits)  Household income should be no more than 200% of the poverty level The clinic cannot treat you if you are pregnant or think you are pregnant  Sexually transmitted diseases are not treated at the clinic.    Dental Care: Organization         Address  Phone  Notes  [facility] Department of [facility] [place] [phone] Accepts children up to age 76 who are enrolled in [place] or [place]; pregnant women with a Medicaid card; and children who have applied for Medicaid or [place] Health Choice, but were declined, whose parents can pay a reduced fee at  time of service.  [facility] of [facility]  [address], [place] [phone] Accepts children up to age 70 who are enrolled in [place] or [place]; pregnant women with a Medicaid card; and children who have applied for Medicaid or [place] Health Choice, but were declined, whose parents can pay a reduced fee at time of service.  [facility]  [place] [phone] Patients are seen by  appointment only. Walk-ins are not accepted. [place] will see patients 15 years of age and older. Monday - Tuesday (8am-5pm) Most Wednesdays (8:30-5pm) $30 per visit, cash only  [facility]  [address], [facility] [phone] Patients are seen by appointment only. Walk-ins are not accepted. [place] will see patients 59 years of age and older. One Wednesday Evening (Monthly: Volunteer Based).  $30 per visit, cash only  [place]  [phone] for adults; Children under age 48, call Graduate Pediatric Dentistry at [phone]. Children aged 38-14, please call [phone] to request a pediatric application.  Dental services are provided in all areas of dental care including fillings, crowns and bridges, complete and partial dentures, implants, gum treatment, root canals, and extractions. Preventive care is also provided. Treatment is provided to both adults and children. Patients are selected via a lottery and there is often a waiting list.   [facility] [address], [place]  [phone] [email or web address]   [facility] [address] [place], [place] [phone], Ext. [phone] Second and Fourth Thursday of each month, opens at 6:30 AM; Clinic ends at 9 AM.  Patients are seen on a first-come first-served basis, and a limited number are seen during each clinic.   [facility]  [address] Hillard Danker  [place], [place] [phone]   Eligibility Requirements You must have lived in [place], [place], or [place] counties for at least the last three months.   You cannot be eligible for state or federal sponsored [employer], including [employer], [place], or [employer].   You generally cannot be eligible for healthcare insurance through your employer.    How to apply: Eligibility screenings are held every Tuesday and Wednesday afternoon from 1:00 pm until 4:00 pm. You do not need an appointment for the interview!  [facility] [address], [place], [place]   [place]  [place] Department  [place]  [phone]    Behavioral Health Resources in the Community: Intensive Outpatient Programs Organization         Address  Phone  Notes  [place]ota [place]. [address] [place], [place] [phone]   [facility] [address], [place], [place]   ADS: Alcohol & Drug Svcs [address] [place], [place]   [place] [address],  [place], [place] or [phone]   Substance Abuse Resources Organization         Address  Phone  Notes  Alcohol and Drug Services  [phone]   [place]  [phone]   [facility] [place]   Chinita Pester  [phone]   Residential & Outpatient Substance Abuse Program  [phone]   Psychological Services Organization         Address  Phone  Notes  [facility] [place]  [place]  [phone]   [place] [address] [place] [phone] or [phone]    Mobile Crisis Teams Organization         Address  Phone  Notes  [facility], Mobile Crisis Care Unit  [phone]   Assertive Psychotherapeutic  Services  [address] [place], [place]   [facility] [address], [place] [phone]  Self-Help/Support Groups Organization         Address  Phone             Notes  [facility] of [place] - variety of support groups  [facility] Call for more information  Narcotics Anonymous (NA), [facility] [address], [employer] [place]  2 meetings at this location   Special educational needs teacher         Address  Phone  Notes  [facility] Residential Treatment [place],    [place]  [phone]   [facility]  [address] [place] [phone], [place], [place]   [place], [place] [phone] Admissions: 8am-3pm M-F  Incentives Substance [place] [address],    [place], [place] [phone]   [facility] [address] [place], [place], [place]   [facility] [address]  [place], [place]   [facility] - Intensive Outpatient [address]een [address], [place], [place]   [facility] [place]  [place], [place] [phone] or [phone]   [facility] Services (RTS) [address], [place], [place] Accepts Medicaid  [facility] [place] [address]  [place] [phone] Substance Abuse/Addiction Treatment   [facility] Organization         Address  Phone  Notes  [facility]  [phone]   Domenic Schwab, PhD [address] Arlis [place], [place]   [phone] or [phone]   [place], [place] [phone]   [facility] [address] [place], [place] [phone] Insurance/Medicaid/sponsorship through [facility] and Families [address]                                    [place], [place] [phone] [place] [address], [place] [phone]    Dr. Adele Schilder  [phone]   [facility] [place] Dept. 1) [address] [place] 2) [place] 3)  [place] [address], [place] [phone]  [phone]   [place] [phone] or [phone] (After Hours)

## 2015-04-12 DIAGNOSIS — I10 Essential (primary) hypertension: Secondary | ICD-10-CM | POA: Diagnosis not present

## 2015-04-12 DIAGNOSIS — I4510 Unspecified right bundle-branch block: Secondary | ICD-10-CM | POA: Diagnosis not present

## 2015-04-12 DIAGNOSIS — N183 Chronic kidney disease, stage 3 (moderate): Secondary | ICD-10-CM | POA: Diagnosis not present

## 2015-04-12 DIAGNOSIS — E1129 Type 2 diabetes mellitus with other diabetic kidney complication: Secondary | ICD-10-CM | POA: Diagnosis not present

## 2015-04-12 DIAGNOSIS — Z6823 Body mass index (BMI) 23.0-23.9, adult: Secondary | ICD-10-CM | POA: Diagnosis not present

## 2015-04-12 DIAGNOSIS — Z87898 Personal history of other specified conditions: Secondary | ICD-10-CM | POA: Diagnosis not present

## 2015-04-12 DIAGNOSIS — E119 Type 2 diabetes mellitus without complications: Secondary | ICD-10-CM | POA: Diagnosis not present

## 2015-04-12 DIAGNOSIS — N3281 Overactive bladder: Secondary | ICD-10-CM | POA: Diagnosis not present

## 2015-04-12 DIAGNOSIS — E7800 Pure hypercholesterolemia, unspecified: Secondary | ICD-10-CM | POA: Diagnosis not present

## 2015-04-12 DIAGNOSIS — Z125 Encounter for screening for malignant neoplasm of prostate: Secondary | ICD-10-CM | POA: Diagnosis not present

## 2015-04-12 DIAGNOSIS — Z1389 Encounter for screening for other disorder: Secondary | ICD-10-CM | POA: Diagnosis not present

## 2015-04-12 DIAGNOSIS — Z0000 Encounter for general adult medical examination without abnormal findings: Secondary | ICD-10-CM | POA: Diagnosis not present

## 2015-04-12 DIAGNOSIS — R972 Elevated prostate specific antigen [PSA]: Secondary | ICD-10-CM | POA: Diagnosis not present

## 2015-04-12 DIAGNOSIS — R808 Other proteinuria: Secondary | ICD-10-CM | POA: Diagnosis not present

## 2015-04-20 DIAGNOSIS — I10 Essential (primary) hypertension: Secondary | ICD-10-CM | POA: Diagnosis not present

## 2015-04-23 ENCOUNTER — Encounter (HOSPITAL_COMMUNITY): Payer: Self-pay | Admitting: *Deleted

## 2015-04-23 ENCOUNTER — Emergency Department (HOSPITAL_COMMUNITY): Payer: Medicare Other

## 2015-04-23 ENCOUNTER — Emergency Department (HOSPITAL_COMMUNITY)
Admission: EM | Admit: 2015-04-23 | Discharge: 2015-04-23 | Disposition: A | Discharge status: Home / Self Care | Payer: Medicare Other | Attending: Emergency Medicine | Admitting: Emergency Medicine

## 2015-04-23 DIAGNOSIS — S098XXA Other specified injuries of head, initial encounter: Secondary | ICD-10-CM | POA: Diagnosis not present

## 2015-04-23 DIAGNOSIS — W1811XA Fall from or off toilet without subsequent striking against object, initial encounter: Secondary | ICD-10-CM | POA: Insufficient documentation

## 2015-04-23 DIAGNOSIS — Z87891 Personal history of nicotine dependence: Secondary | ICD-10-CM | POA: Diagnosis not present

## 2015-04-23 DIAGNOSIS — Y92002 Bathroom of unspecified non-institutional (private) residence single-family (private) house as the place of occurrence of the external cause: Secondary | ICD-10-CM | POA: Insufficient documentation

## 2015-04-23 DIAGNOSIS — S0990XA Unspecified injury of head, initial encounter: Secondary | ICD-10-CM | POA: Diagnosis not present

## 2015-04-23 DIAGNOSIS — Y9389 Activity, other specified: Secondary | ICD-10-CM | POA: Insufficient documentation

## 2015-04-23 DIAGNOSIS — Z043 Encounter for examination and observation following other accident: Secondary | ICD-10-CM | POA: Diagnosis not present

## 2015-04-23 DIAGNOSIS — Z8619 Personal history of other infectious and parasitic diseases: Secondary | ICD-10-CM | POA: Insufficient documentation

## 2015-04-23 DIAGNOSIS — Z8639 Personal history of other endocrine, nutritional and metabolic disease: Secondary | ICD-10-CM | POA: Diagnosis not present

## 2015-04-23 DIAGNOSIS — Z23 Encounter for immunization: Secondary | ICD-10-CM | POA: Insufficient documentation

## 2015-04-23 DIAGNOSIS — S0091XA Abrasion of unspecified part of head, initial encounter: Secondary | ICD-10-CM | POA: Diagnosis not present

## 2015-04-23 DIAGNOSIS — Z8669 Personal history of other diseases of the nervous system and sense organs: Secondary | ICD-10-CM | POA: Insufficient documentation

## 2015-04-23 DIAGNOSIS — R55 Syncope and collapse: Secondary | ICD-10-CM | POA: Insufficient documentation

## 2015-04-23 DIAGNOSIS — Y998 Other external cause status: Secondary | ICD-10-CM | POA: Insufficient documentation

## 2015-04-23 DIAGNOSIS — Z79899 Other long term (current) drug therapy: Secondary | ICD-10-CM | POA: Insufficient documentation

## 2015-04-23 DIAGNOSIS — I10 Essential (primary) hypertension: Secondary | ICD-10-CM | POA: Insufficient documentation

## 2015-04-23 DIAGNOSIS — Z8709 Personal history of other diseases of the respiratory system: Secondary | ICD-10-CM | POA: Diagnosis not present

## 2015-04-23 LAB — CBG MONITORING, ED: Glucose-Capillary: 144 mg/dL — ABNORMAL HIGH (ref 65–99)

## 2015-04-23 LAB — CBC WITH DIFFERENTIAL/PLATELET
BASOS ABS: 0 10*3/uL (ref 0.0–0.1)
Basophils Relative: 0 %
Eosinophils Absolute: 0.1 10*3/uL (ref 0.0–0.7)
Eosinophils Relative: 1 %
HEMATOCRIT: 31.3 % — AB (ref 39.0–52.0)
HEMOGLOBIN: 10.7 g/dL — AB (ref 13.0–17.0)
LYMPHS PCT: 9 %
Lymphs Abs: 0.6 10*3/uL — ABNORMAL LOW (ref 0.7–4.0)
MCH: 28.8 pg (ref 26.0–34.0)
MCHC: 34.2 g/dL (ref 30.0–36.0)
MCV: 84.1 fL (ref 78.0–100.0)
MONO ABS: 0.8 10*3/uL (ref 0.1–1.0)
Monocytes Relative: 12 %
NEUTROS ABS: 5.2 10*3/uL (ref 1.7–7.7)
NEUTROS PCT: 78 %
Platelets: 231 10*3/uL (ref 150–400)
RBC: 3.72 MIL/uL — AB (ref 4.22–5.81)
RDW: 13 % (ref 11.5–15.5)
WBC: 6.6 10*3/uL (ref 4.0–10.5)

## 2015-04-23 LAB — BASIC METABOLIC PANEL
ANION GAP: 10 (ref 5–15)
BUN: 38 mg/dL — ABNORMAL HIGH (ref 6–20)
CHLORIDE: 111 mmol/L (ref 101–111)
CO2: 21 mmol/L — AB (ref 22–32)
Calcium: 9.1 mg/dL (ref 8.9–10.3)
Creatinine, Ser: 1.77 mg/dL — ABNORMAL HIGH (ref 0.61–1.24)
GFR calc non Af Amer: 35 mL/min — ABNORMAL LOW (ref >60)
GFR, EST AFRICAN AMERICAN: 40 mL/min — AB (ref >60)
GLUCOSE: 147 mg/dL — AB (ref 65–99)
POTASSIUM: 4.7 mmol/L (ref 3.5–5.1)
Sodium: 142 mmol/L (ref 135–145)

## 2015-04-23 MED ORDER — TETANUS-DIPHTH-ACELL PERTUSSIS 5-2.5-18.5 LF-MCG/0.5 IM SUSP
0.5000 mL | Freq: Once | INTRAMUSCULAR | Status: AC
  Administered 2015-04-23: 0.5 mL via INTRAMUSCULAR
  Filled 2015-04-23: qty 0.5

## 2015-04-23 NOTE — Discharge Instructions (Signed)
Follow up with your doctor for further evaluation. He may feel that you need an ultrasound of the heart.  Syncope Syncope is a medical term for fainting or passing out. This means you lose consciousness and drop to the ground. People are generally unconscious for less than 5 minutes. You may have some muscle twitches for up to 15 seconds before waking up and returning to normal. Syncope occurs more often in older adults, but it can happen to anyone. While most causes of syncope are not dangerous, syncope can be a sign of a serious medical problem. It is important to seek medical care.  CAUSES  Syncope is caused by a sudden drop in blood flow to the brain. The specific cause is often not determined. Factors that can bring on syncope include:  Taking medicines that lower blood pressure.  Sudden changes in posture, such as standing up quickly.  Taking more medicine than prescribed.  Standing in one place for too long.  Seizure disorders.  Dehydration and excessive exposure to heat.  Low blood sugar (hypoglycemia).  Straining to have a bowel movement.  Heart disease, irregular heartbeat, or other circulatory problems.  Fear, emotional distress, seeing blood, or severe pain. SYMPTOMS  Right before fainting, you may:  Feel dizzy or light-headed.  Feel nauseous.  See all white or all black in your field of vision.  Have cold, clammy skin. DIAGNOSIS  Your health care provider will ask about your symptoms, perform a physical exam, and perform an electrocardiogram (ECG) to record the electrical activity of your heart. Your health care provider may also perform other heart or blood tests to determine the cause of your syncope which may include:  Transthoracic echocardiogram (TTE). During echocardiography, sound waves are used to evaluate how blood flows through your heart.  Transesophageal echocardiogram (TEE).  Cardiac monitoring. This allows your health care provider to monitor your  heart rate and rhythm in real time.  Holter monitor. This is a portable device that records your heartbeat and can help diagnose heart arrhythmias. It allows your health care provider to track your heart activity for several days, if needed.  Stress tests by exercise or by giving medicine that makes the heart beat faster. TREATMENT  In most cases, no treatment is needed. Depending on the cause of your syncope, your health care provider may recommend changing or stopping some of your medicines. HOME CARE INSTRUCTIONS  Have someone stay with you until you feel stable.  Do not drive, use machinery, or play sports until your health care provider says it is okay.  Keep all follow-up appointments as directed by your health care provider.  Lie down right away if you start feeling like you might faint. Breathe deeply and steadily. Wait until all the symptoms have passed.  Drink enough fluids to keep your urine clear or pale yellow.  If you are taking blood pressure or heart medicine, get up slowly and take several minutes to sit and then stand. This can reduce dizziness. SEEK IMMEDIATE MEDICAL CARE IF:   You have a severe headache.  You have unusual pain in the chest, abdomen, or back.  You are bleeding from your mouth or rectum, or you have black or tarry stool.  You have an irregular or very fast heartbeat.  You have pain with breathing.  You have repeated fainting or seizure-like jerking during an episode.  You faint when sitting or lying down.  You have confusion.  You have trouble walking.  You have severe  weakness.  You have vision problems. If you fainted, call your local emergency services (911 in [place].S.). Do not drive yourself to the hospital.    This information is not intended to replace advice given to you by your health care provider. Make sure you discuss any questions you have with your health care provider.   Document Released: 03/18/2005 Document Revised:  08/02/2014 Document Reviewed: 05/17/2011 Elsevier Interactive Patient Education [employer].

## 2015-04-23 NOTE — ED Notes (Signed)
Patient transported to CT 

## 2015-04-23 NOTE — ED Notes (Signed)
Per [facility] - pt from home, was exercising and lifting weights when he went to the bathroom to have a BM - pt experienced a syncopal episode - unwitnessed. Pt denies any pain at present. Pt passed [employer] screening for EMS

## 2015-04-23 NOTE — ED Provider Notes (Signed)
CSN: [id number]     Arrival date & time 04/23/15  1142 History   First MD Initiated Contact with Patient 04/23/15 1147     Chief Complaint  Patient presents with  . Loss of Consciousness  . Fall     (Consider location/radiation/quality/duration/timing/severity/associated sxs/prior Treatment) Patient is a 82 y.o. male presenting with syncope and fall.  Loss of Consciousness Episode history:  Multiple Most recent episode:  Today Duration:  20 seconds Timing:  Constant Progression:  Partially resolved Chronicity:  New Witnessed: yes   Relieved by:  Lying down Worsened by:  Nothing tried Ineffective treatments:  None tried Associated symptoms: no chest pain, no confusion, no fever, no headaches, no palpitations, no shortness of breath and no vomiting   Risk factors: no congenital heart disease, no coronary artery disease and no seizures   Fall Pertinent negatives include no chest pain, no abdominal pain, no headaches and no shortness of breath.    82 yo M  With a chief complaint of syncope. Patient states this morning he had some vigorous exercise and then went home took a hot shower sat down on the toilet to dry off and then blacked out. Patient came to put on some underwear sat back down and then went out for a couple seconds again.  Patient without symptoms now.  Denies fevers, chills. Denies chest pain, sob, headache.  Denies lower extremity edema.   Past Medical History  Diagnosis Date  . HTN (hypertension)   . Hyperlipidemia   . Hyperlipidemia   . Chicken pox   . Frequent headaches   . Glaucoma   . Hay fever   . Migraines   . History of blood transfusion   . 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[facility])    Past Surgical History  Procedure Laterality Date  . Cholecystectomy, laparoscopic    . Tonsillectomy  1942   Family History  Problem Relation Age of Onset  . Liver disease Mother   . Skin cancer Father   . Skin cancer Paternal Grandfather   . Diabetes Maternal  Grandfather    Social History  Substance Use Topics  . Smoking status: Former Smoker    Start date: 04/01/1968    Quit date: 01/11/1977  . Smokeless tobacco: Never Used  . Alcohol Use: No    Review of Systems  Constitutional: Negative for fever and chills.  HENT: Negative for congestion and facial swelling.   Eyes: Negative for discharge and visual disturbance.  Respiratory: Negative for shortness of breath.   Cardiovascular: Positive for syncope. Negative for chest pain and palpitations.  Gastrointestinal: Negative for vomiting, abdominal pain and diarrhea.  Musculoskeletal: Negative for myalgias and arthralgias.  Skin: Negative for color change and rash.  Neurological: Positive for syncope. Negative for tremors and headaches.  Psychiatric/Behavioral: Negative for confusion and dysphoric mood.      Allergies  Review of patient's allergies indicates no known allergies.  Home Medications   Prior to Admission medications   Medication Sig Start Date End Date Taking? Authorizing Provider  NON FORMULARY Take 2-4 capsules by mouth 2 (two) times daily. PROSTATE THERAPY   Yes Historical Provider, MD  NON FORMULARY Take 2 capsules by mouth daily. 8-INGREDIENT THERAPY: BP, STRENGTH IMMUNITY   Yes Historical Provider, MD  NON FORMULARY Take 1 capsule by mouth daily. NERVE TONIC-A: STRESS, COGNITIVE FUNCTION   Yes Historical Provider, MD  NON FORMULARY Apply topically as directed. BPC HEADACHE THERAPY   Yes Historical Provider, MD   BP 142/66 mmHg  Pulse 83  Temp(Src) 97.4 F (36.3 C) (Oral)  Resp 14  Ht 5' 6.5" (1.689 m)  Wt 143 lb (64.864 kg)  BMI 22.74 kg/m2  SpO2 99% Physical Exam  Constitutional: He is oriented to person, place, and time. He appears well-developed and well-nourished.  HENT:  Head: Normocephalic and atraumatic.  Eyes: EOM are normal. Pupils are equal, round, and reactive to light.  Neck: Normal range of motion. Neck supple. No JVD present.   Cardiovascular: Normal rate and regular rhythm.  Exam reveals no gallop and no friction rub.   No murmur heard. Pulmonary/Chest: No respiratory distress. He has no wheezes. He has no rales.  Abdominal: He exhibits no distension. There is no tenderness. There is no rebound and no guarding.  Musculoskeletal: Normal range of motion. He exhibits no edema.  Neurological: He is alert and oriented to person, place, and time.  Skin: No rash noted. No pallor.  Psychiatric: He has a normal mood and affect. His behavior is normal.  Nursing note and vitals reviewed.   ED Course  Procedures (including critical care time) Labs Review Labs Reviewed  CBC WITH DIFFERENTIAL/PLATELET - Abnormal; Notable for the following:    RBC 3.72 (*)    Hemoglobin 10.7 (*)    HCT 31.3 (*)    Lymphs Abs 0.6 (*)    All other components within normal limits  BASIC METABOLIC PANEL - Abnormal; Notable for the following:    CO2 21 (*)    Glucose, Bld 147 (*)    BUN 38 (*)    Creatinine, Ser 1.77 (*)    GFR calc non Af Amer 35 (*)    GFR calc Af Amer 40 (*)    All other components within normal limits  CBG MONITORING, ED - Abnormal; Notable for the following:    Glucose-Capillary 144 (*)    All other components within normal limits    Imaging Review Ct Head Wo Contrast  04/23/2015  CLINICAL DATA:  Fall from toilet with left head injury EXAM: CT HEAD WITHOUT CONTRAST TECHNIQUE: Contiguous axial images were obtained from the base of the skull through the vertex without intravenous contrast. COMPARISON:  01/14/2015 FINDINGS: The bony calvarium is intact. No gross soft tissue abnormality is noted. Mild atrophic changes are noted. No findings to suggest acute hemorrhage or acute infarct are noted. IMPRESSION: Mild atrophy without acute abnormality. Electronically Signed   By: Inez Catalina M.D.   On: 04/23/2015 14:10   I have personally reviewed and evaluated these images and lab results as part of my medical  decision-making.   EKG Interpretation   Date/Time:  Sunday April 23 2015 12:05:55 EST Ventricular Rate:  78 PR Interval:  193 QRS Duration: 142 QT Interval:  432 QTC Calculation: 492 R Axis:   -49 Text Interpretation:  Sinus rhythm RBBB and LAFB no wpw, prolonged qt, or  brudaga No significant change since last tracing Confirmed by Kendale Rembold MD,  Quillian Quince [id number]) on 04/23/2015 1:58:49 PM      MDM   Final diagnoses:  Syncope and collapse    82 yo M with syncope.  CT head, basic labs and ecg unremarkable.  Creatinine 1.7 sounds at baseline when discussed with patient.  Not hypoxic, no current symptoms.  Feel likely due to peripheral vasodilation post hot shower and hard workout.  PCP follow up.  2:49 PM:  I have discussed the diagnosis/risks/treatment options with the patient and believe the pt to be eligible for discharge home to follow-up with  PCP. We also discussed returning to the ED immediately if new or worsening sx occur. We discussed the sx which are most concerning (e.g., sudden worsening pain, fever, inability to tolerate by mouth) that necessitate immediate return. Medications administered to the patient during their visit and any new prescriptions provided to the patient are listed below.  Medications given during this visit Medications  Tdap (BOOSTRIX) injection 0.5 mL (not administered)    New Prescriptions   No medications on file    The patient appears reasonably screen and/or stabilized for discharge and I doubt any other medical condition or other [facility] requiring further screening, evaluation, or treatment in the ED at this time prior to discharge.        Deno Etienne, DO 04/23/15 1450

## 2015-04-23 NOTE — ED Notes (Signed)
MD at bedside. 

## 2015-05-04 DIAGNOSIS — Z6823 Body mass index (BMI) 23.0-23.9, adult: Secondary | ICD-10-CM | POA: Diagnosis not present

## 2015-05-04 DIAGNOSIS — Z1389 Encounter for screening for other disorder: Secondary | ICD-10-CM | POA: Diagnosis not present

## 2015-05-04 DIAGNOSIS — N183 Chronic kidney disease, stage 3 (moderate): Secondary | ICD-10-CM | POA: Diagnosis not present

## 2015-05-04 DIAGNOSIS — R55 Syncope and collapse: Secondary | ICD-10-CM | POA: Diagnosis not present

## 2015-05-04 DIAGNOSIS — I4510 Unspecified right bundle-branch block: Secondary | ICD-10-CM | POA: Diagnosis not present

## 2015-05-04 DIAGNOSIS — I10 Essential (primary) hypertension: Secondary | ICD-10-CM | POA: Diagnosis not present

## 2015-05-16 DIAGNOSIS — H2513 Age-related nuclear cataract, bilateral: Secondary | ICD-10-CM | POA: Diagnosis not present

## 2015-05-16 DIAGNOSIS — H04123 Dry eye syndrome of bilateral lacrimal glands: Secondary | ICD-10-CM | POA: Diagnosis not present

## 2015-05-16 DIAGNOSIS — H40052 Ocular hypertension, left eye: Secondary | ICD-10-CM | POA: Diagnosis not present

## 2015-05-16 DIAGNOSIS — H40051 Ocular hypertension, right eye: Secondary | ICD-10-CM | POA: Diagnosis not present

## 2015-10-27 DIAGNOSIS — E7800 Pure hypercholesterolemia, unspecified: Secondary | ICD-10-CM | POA: Diagnosis not present

## 2015-10-27 DIAGNOSIS — Z6823 Body mass index (BMI) 23.0-23.9, adult: Secondary | ICD-10-CM | POA: Diagnosis not present

## 2015-10-27 DIAGNOSIS — R55 Syncope and collapse: Secondary | ICD-10-CM | POA: Diagnosis not present

## 2015-10-27 DIAGNOSIS — Z87898 Personal history of other specified conditions: Secondary | ICD-10-CM | POA: Diagnosis not present

## 2015-10-27 DIAGNOSIS — R808 Other proteinuria: Secondary | ICD-10-CM | POA: Diagnosis not present

## 2015-10-27 DIAGNOSIS — E1129 Type 2 diabetes mellitus with other diabetic kidney complication: Secondary | ICD-10-CM | POA: Diagnosis not present

## 2015-10-27 DIAGNOSIS — I10 Essential (primary) hypertension: Secondary | ICD-10-CM | POA: Diagnosis not present

## 2015-10-27 DIAGNOSIS — N3281 Overactive bladder: Secondary | ICD-10-CM | POA: Diagnosis not present

## 2015-10-27 DIAGNOSIS — N183 Chronic kidney disease, stage 3 (moderate): Secondary | ICD-10-CM | POA: Diagnosis not present

## 2015-11-13 DIAGNOSIS — H401113 Primary open-angle glaucoma, right eye, severe stage: Secondary | ICD-10-CM | POA: Diagnosis not present

## 2015-11-13 DIAGNOSIS — H401123 Primary open-angle glaucoma, left eye, severe stage: Secondary | ICD-10-CM | POA: Diagnosis not present

## 2016-01-23 DIAGNOSIS — H401112 Primary open-angle glaucoma, right eye, moderate stage: Secondary | ICD-10-CM | POA: Diagnosis not present

## 2016-01-23 DIAGNOSIS — H401122 Primary open-angle glaucoma, left eye, moderate stage: Secondary | ICD-10-CM | POA: Diagnosis not present

## 2016-04-23 DIAGNOSIS — E7800 Pure hypercholesterolemia, unspecified: Secondary | ICD-10-CM | POA: Diagnosis not present

## 2016-04-23 DIAGNOSIS — H401133 Primary open-angle glaucoma, bilateral, severe stage: Secondary | ICD-10-CM | POA: Diagnosis not present

## 2016-04-23 DIAGNOSIS — I10 Essential (primary) hypertension: Secondary | ICD-10-CM | POA: Diagnosis not present

## 2016-04-23 DIAGNOSIS — R8299 Other abnormal findings in urine: Secondary | ICD-10-CM | POA: Diagnosis not present

## 2016-04-23 DIAGNOSIS — E1129 Type 2 diabetes mellitus with other diabetic kidney complication: Secondary | ICD-10-CM | POA: Diagnosis not present

## 2016-04-23 DIAGNOSIS — Z125 Encounter for screening for malignant neoplasm of prostate: Secondary | ICD-10-CM | POA: Diagnosis not present

## 2016-04-26 DIAGNOSIS — E1129 Type 2 diabetes mellitus with other diabetic kidney complication: Secondary | ICD-10-CM | POA: Diagnosis not present

## 2016-04-26 DIAGNOSIS — I10 Essential (primary) hypertension: Secondary | ICD-10-CM | POA: Diagnosis not present

## 2016-04-26 DIAGNOSIS — Z0000 Encounter for general adult medical examination without abnormal findings: Secondary | ICD-10-CM | POA: Diagnosis not present

## 2016-04-26 DIAGNOSIS — E7800 Pure hypercholesterolemia, unspecified: Secondary | ICD-10-CM | POA: Diagnosis not present

## 2016-04-26 DIAGNOSIS — Z1389 Encounter for screening for other disorder: Secondary | ICD-10-CM | POA: Diagnosis not present

## 2016-04-26 DIAGNOSIS — R808 Other proteinuria: Secondary | ICD-10-CM | POA: Diagnosis not present

## 2016-04-26 DIAGNOSIS — Z6825 Body mass index (BMI) 25.0-25.9, adult: Secondary | ICD-10-CM | POA: Diagnosis not present

## 2016-04-26 DIAGNOSIS — N183 Chronic kidney disease, stage 3 (moderate): Secondary | ICD-10-CM | POA: Diagnosis not present

## 2016-04-26 DIAGNOSIS — N3281 Overactive bladder: Secondary | ICD-10-CM | POA: Diagnosis not present

## 2016-04-26 DIAGNOSIS — Z87898 Personal history of other specified conditions: Secondary | ICD-10-CM | POA: Diagnosis not present

## 2016-09-30 DIAGNOSIS — H524 Presbyopia: Secondary | ICD-10-CM | POA: Diagnosis not present

## 2016-09-30 DIAGNOSIS — H2513 Age-related nuclear cataract, bilateral: Secondary | ICD-10-CM | POA: Diagnosis not present

## 2016-09-30 DIAGNOSIS — H401133 Primary open-angle glaucoma, bilateral, severe stage: Secondary | ICD-10-CM | POA: Diagnosis not present

## 2016-12-26 DIAGNOSIS — H401133 Primary open-angle glaucoma, bilateral, severe stage: Secondary | ICD-10-CM | POA: Diagnosis not present

## 2016-12-26 DIAGNOSIS — H353132 Nonexudative age-related macular degeneration, bilateral, intermediate dry stage: Secondary | ICD-10-CM | POA: Diagnosis not present

## 2016-12-26 DIAGNOSIS — H25043 Posterior subcapsular polar age-related cataract, bilateral: Secondary | ICD-10-CM | POA: Diagnosis not present

## 2016-12-30 DIAGNOSIS — R972 Elevated prostate specific antigen [PSA]: Secondary | ICD-10-CM | POA: Diagnosis not present

## 2016-12-30 DIAGNOSIS — E7800 Pure hypercholesterolemia, unspecified: Secondary | ICD-10-CM | POA: Diagnosis not present

## 2016-12-30 DIAGNOSIS — I10 Essential (primary) hypertension: Secondary | ICD-10-CM | POA: Diagnosis not present

## 2016-12-30 DIAGNOSIS — Z6823 Body mass index (BMI) 23.0-23.9, adult: Secondary | ICD-10-CM | POA: Diagnosis not present

## 2016-12-30 DIAGNOSIS — I4510 Unspecified right bundle-branch block: Secondary | ICD-10-CM | POA: Diagnosis not present

## 2016-12-30 DIAGNOSIS — Z87898 Personal history of other specified conditions: Secondary | ICD-10-CM | POA: Diagnosis not present

## 2016-12-30 DIAGNOSIS — N183 Chronic kidney disease, stage 3 (moderate): Secondary | ICD-10-CM | POA: Diagnosis not present

## 2016-12-30 DIAGNOSIS — E1129 Type 2 diabetes mellitus with other diabetic kidney complication: Secondary | ICD-10-CM | POA: Diagnosis not present

## 2016-12-30 DIAGNOSIS — N3281 Overactive bladder: Secondary | ICD-10-CM | POA: Diagnosis not present

## 2016-12-30 DIAGNOSIS — R808 Other proteinuria: Secondary | ICD-10-CM | POA: Diagnosis not present

## 2016-12-31 DIAGNOSIS — R972 Elevated prostate specific antigen [PSA]: Secondary | ICD-10-CM | POA: Diagnosis not present

## 2017-02-18 DIAGNOSIS — H401132 Primary open-angle glaucoma, bilateral, moderate stage: Secondary | ICD-10-CM | POA: Diagnosis not present

## 2017-03-20 DIAGNOSIS — H2513 Age-related nuclear cataract, bilateral: Secondary | ICD-10-CM | POA: Diagnosis not present

## 2017-04-16 DIAGNOSIS — H25812 Combined forms of age-related cataract, left eye: Secondary | ICD-10-CM | POA: Diagnosis not present

## 2017-04-16 DIAGNOSIS — H2512 Age-related nuclear cataract, left eye: Secondary | ICD-10-CM | POA: Diagnosis not present

## 2017-07-02 DIAGNOSIS — E1129 Type 2 diabetes mellitus with other diabetic kidney complication: Secondary | ICD-10-CM | POA: Diagnosis not present

## 2017-07-02 DIAGNOSIS — R809 Proteinuria, unspecified: Secondary | ICD-10-CM | POA: Diagnosis not present

## 2017-07-02 DIAGNOSIS — R972 Elevated prostate specific antigen [PSA]: Secondary | ICD-10-CM | POA: Diagnosis not present

## 2017-07-02 DIAGNOSIS — I10 Essential (primary) hypertension: Secondary | ICD-10-CM | POA: Diagnosis not present

## 2017-07-02 DIAGNOSIS — I4510 Unspecified right bundle-branch block: Secondary | ICD-10-CM | POA: Diagnosis not present

## 2017-07-02 DIAGNOSIS — Z1389 Encounter for screening for other disorder: Secondary | ICD-10-CM | POA: Diagnosis not present

## 2017-07-02 DIAGNOSIS — N3281 Overactive bladder: Secondary | ICD-10-CM | POA: Diagnosis not present

## 2017-07-02 DIAGNOSIS — E7800 Pure hypercholesterolemia, unspecified: Secondary | ICD-10-CM | POA: Diagnosis not present

## 2017-07-02 DIAGNOSIS — Z87898 Personal history of other specified conditions: Secondary | ICD-10-CM | POA: Diagnosis not present

## 2017-07-02 DIAGNOSIS — N183 Chronic kidney disease, stage 3 (moderate): Secondary | ICD-10-CM | POA: Diagnosis not present

## 2017-07-02 DIAGNOSIS — Z6824 Body mass index (BMI) 24.0-24.9, adult: Secondary | ICD-10-CM | POA: Diagnosis not present

## 2017-12-30 DIAGNOSIS — E1129 Type 2 diabetes mellitus with other diabetic kidney complication: Secondary | ICD-10-CM | POA: Diagnosis not present

## 2017-12-30 DIAGNOSIS — I10 Essential (primary) hypertension: Secondary | ICD-10-CM | POA: Diagnosis not present

## 2017-12-30 DIAGNOSIS — R972 Elevated prostate specific antigen [PSA]: Secondary | ICD-10-CM | POA: Diagnosis not present

## 2017-12-30 DIAGNOSIS — H9193 Unspecified hearing loss, bilateral: Secondary | ICD-10-CM | POA: Diagnosis not present

## 2017-12-30 DIAGNOSIS — M1990 Unspecified osteoarthritis, unspecified site: Secondary | ICD-10-CM | POA: Diagnosis not present

## 2017-12-30 DIAGNOSIS — H4089 Other specified glaucoma: Secondary | ICD-10-CM | POA: Diagnosis not present

## 2017-12-30 DIAGNOSIS — N3281 Overactive bladder: Secondary | ICD-10-CM | POA: Diagnosis not present

## 2017-12-30 DIAGNOSIS — N183 Chronic kidney disease, stage 3 (moderate): Secondary | ICD-10-CM | POA: Diagnosis not present

## 2017-12-30 DIAGNOSIS — R808 Other proteinuria: Secondary | ICD-10-CM | POA: Diagnosis not present

## 2017-12-30 DIAGNOSIS — Z6823 Body mass index (BMI) 23.0-23.9, adult: Secondary | ICD-10-CM | POA: Diagnosis not present

## 2017-12-30 DIAGNOSIS — E7800 Pure hypercholesterolemia, unspecified: Secondary | ICD-10-CM | POA: Diagnosis not present

## 2018-05-21 DIAGNOSIS — H2511 Age-related nuclear cataract, right eye: Secondary | ICD-10-CM | POA: Diagnosis not present

## 2018-05-21 DIAGNOSIS — H04123 Dry eye syndrome of bilateral lacrimal glands: Secondary | ICD-10-CM | POA: Diagnosis not present

## 2018-05-21 DIAGNOSIS — H401132 Primary open-angle glaucoma, bilateral, moderate stage: Secondary | ICD-10-CM | POA: Diagnosis not present

## 2018-05-21 DIAGNOSIS — H5201 Hypermetropia, right eye: Secondary | ICD-10-CM | POA: Diagnosis not present

## 2018-06-29 DIAGNOSIS — E875 Hyperkalemia: Secondary | ICD-10-CM | POA: Diagnosis not present

## 2018-06-29 DIAGNOSIS — H9193 Unspecified hearing loss, bilateral: Secondary | ICD-10-CM | POA: Diagnosis not present

## 2018-06-29 DIAGNOSIS — I10 Essential (primary) hypertension: Secondary | ICD-10-CM | POA: Diagnosis not present

## 2018-06-29 DIAGNOSIS — N3281 Overactive bladder: Secondary | ICD-10-CM | POA: Diagnosis not present

## 2018-06-29 DIAGNOSIS — R808 Other proteinuria: Secondary | ICD-10-CM | POA: Diagnosis not present

## 2018-06-29 DIAGNOSIS — E7800 Pure hypercholesterolemia, unspecified: Secondary | ICD-10-CM | POA: Diagnosis not present

## 2018-06-29 DIAGNOSIS — N183 Chronic kidney disease, stage 3 (moderate): Secondary | ICD-10-CM | POA: Diagnosis not present

## 2018-06-29 DIAGNOSIS — Z6824 Body mass index (BMI) 24.0-24.9, adult: Secondary | ICD-10-CM | POA: Diagnosis not present

## 2018-06-29 DIAGNOSIS — M1990 Unspecified osteoarthritis, unspecified site: Secondary | ICD-10-CM | POA: Diagnosis not present

## 2018-06-29 DIAGNOSIS — E1129 Type 2 diabetes mellitus with other diabetic kidney complication: Secondary | ICD-10-CM | POA: Diagnosis not present

## 2018-06-29 DIAGNOSIS — R972 Elevated prostate specific antigen [PSA]: Secondary | ICD-10-CM | POA: Diagnosis not present

## 2018-08-14 DIAGNOSIS — H2511 Age-related nuclear cataract, right eye: Secondary | ICD-10-CM | POA: Diagnosis not present

## 2018-08-14 DIAGNOSIS — H401133 Primary open-angle glaucoma, bilateral, severe stage: Secondary | ICD-10-CM | POA: Diagnosis not present

## 2018-09-04 DIAGNOSIS — H04123 Dry eye syndrome of bilateral lacrimal glands: Secondary | ICD-10-CM | POA: Diagnosis not present

## 2018-09-09 DIAGNOSIS — H2511 Age-related nuclear cataract, right eye: Secondary | ICD-10-CM | POA: Diagnosis not present

## 2018-09-09 DIAGNOSIS — H25811 Combined forms of age-related cataract, right eye: Secondary | ICD-10-CM | POA: Diagnosis not present

## 2018-10-28 DIAGNOSIS — I1310 Hypertensive heart and chronic kidney disease without heart failure, with stage 1 through stage 4 chronic kidney disease, or unspecified chronic kidney disease: Secondary | ICD-10-CM | POA: Diagnosis not present

## 2018-10-28 DIAGNOSIS — R109 Unspecified abdominal pain: Secondary | ICD-10-CM | POA: Diagnosis not present

## 2018-10-28 DIAGNOSIS — E1129 Type 2 diabetes mellitus with other diabetic kidney complication: Secondary | ICD-10-CM | POA: Diagnosis not present

## 2018-10-28 DIAGNOSIS — N183 Chronic kidney disease, stage 3 (moderate): Secondary | ICD-10-CM | POA: Diagnosis not present

## 2019-01-06 DIAGNOSIS — E7800 Pure hypercholesterolemia, unspecified: Secondary | ICD-10-CM | POA: Diagnosis not present

## 2019-01-06 DIAGNOSIS — Z125 Encounter for screening for malignant neoplasm of prostate: Secondary | ICD-10-CM | POA: Diagnosis not present

## 2019-01-06 DIAGNOSIS — E1129 Type 2 diabetes mellitus with other diabetic kidney complication: Secondary | ICD-10-CM | POA: Diagnosis not present

## 2019-01-14 DIAGNOSIS — N1832 Chronic kidney disease, stage 3b: Secondary | ICD-10-CM | POA: Diagnosis not present

## 2019-01-14 DIAGNOSIS — Z1339 Encounter for screening examination for other mental health and behavioral disorders: Secondary | ICD-10-CM | POA: Diagnosis not present

## 2019-01-14 DIAGNOSIS — E1129 Type 2 diabetes mellitus with other diabetic kidney complication: Secondary | ICD-10-CM | POA: Diagnosis not present

## 2019-01-14 DIAGNOSIS — N3281 Overactive bladder: Secondary | ICD-10-CM | POA: Diagnosis not present

## 2019-01-14 DIAGNOSIS — H409 Unspecified glaucoma: Secondary | ICD-10-CM | POA: Diagnosis not present

## 2019-01-14 DIAGNOSIS — E7800 Pure hypercholesterolemia, unspecified: Secondary | ICD-10-CM | POA: Diagnosis not present

## 2019-01-14 DIAGNOSIS — Z0000 Encounter for general adult medical examination without abnormal findings: Secondary | ICD-10-CM | POA: Diagnosis not present

## 2019-01-14 DIAGNOSIS — M1990 Unspecified osteoarthritis, unspecified site: Secondary | ICD-10-CM | POA: Diagnosis not present

## 2019-01-14 DIAGNOSIS — R972 Elevated prostate specific antigen [PSA]: Secondary | ICD-10-CM | POA: Diagnosis not present

## 2019-01-14 DIAGNOSIS — H9193 Unspecified hearing loss, bilateral: Secondary | ICD-10-CM | POA: Diagnosis not present

## 2019-01-14 DIAGNOSIS — I1310 Hypertensive heart and chronic kidney disease without heart failure, with stage 1 through stage 4 chronic kidney disease, or unspecified chronic kidney disease: Secondary | ICD-10-CM | POA: Diagnosis not present

## 2019-01-14 DIAGNOSIS — R809 Proteinuria, unspecified: Secondary | ICD-10-CM | POA: Diagnosis not present

## 2019-07-20 DIAGNOSIS — Z961 Presence of intraocular lens: Secondary | ICD-10-CM | POA: Diagnosis not present

## 2019-07-20 DIAGNOSIS — H5212 Myopia, left eye: Secondary | ICD-10-CM | POA: Diagnosis not present

## 2019-07-20 DIAGNOSIS — H401131 Primary open-angle glaucoma, bilateral, mild stage: Secondary | ICD-10-CM | POA: Diagnosis not present

## 2019-07-20 DIAGNOSIS — H5201 Hypermetropia, right eye: Secondary | ICD-10-CM | POA: Diagnosis not present

## 2019-08-10 DIAGNOSIS — R972 Elevated prostate specific antigen [PSA]: Secondary | ICD-10-CM | POA: Diagnosis not present

## 2019-08-10 DIAGNOSIS — H9193 Unspecified hearing loss, bilateral: Secondary | ICD-10-CM | POA: Diagnosis not present

## 2019-08-10 DIAGNOSIS — R809 Proteinuria, unspecified: Secondary | ICD-10-CM | POA: Diagnosis not present

## 2019-08-10 DIAGNOSIS — I1310 Hypertensive heart and chronic kidney disease without heart failure, with stage 1 through stage 4 chronic kidney disease, or unspecified chronic kidney disease: Secondary | ICD-10-CM | POA: Diagnosis not present

## 2019-08-10 DIAGNOSIS — M1990 Unspecified osteoarthritis, unspecified site: Secondary | ICD-10-CM | POA: Diagnosis not present

## 2019-08-10 DIAGNOSIS — N3281 Overactive bladder: Secondary | ICD-10-CM | POA: Diagnosis not present

## 2019-08-10 DIAGNOSIS — L603 Nail dystrophy: Secondary | ICD-10-CM | POA: Diagnosis not present

## 2019-08-10 DIAGNOSIS — N1832 Chronic kidney disease, stage 3b: Secondary | ICD-10-CM | POA: Diagnosis not present

## 2019-08-10 DIAGNOSIS — Z1331 Encounter for screening for depression: Secondary | ICD-10-CM | POA: Diagnosis not present

## 2019-08-10 DIAGNOSIS — E1129 Type 2 diabetes mellitus with other diabetic kidney complication: Secondary | ICD-10-CM | POA: Diagnosis not present

## 2019-08-10 DIAGNOSIS — Z87898 Personal history of other specified conditions: Secondary | ICD-10-CM | POA: Diagnosis not present

## 2019-08-10 DIAGNOSIS — E7800 Pure hypercholesterolemia, unspecified: Secondary | ICD-10-CM | POA: Diagnosis not present

## 2019-08-13 ENCOUNTER — Encounter: Payer: Self-pay | Admitting: Podiatry

## 2019-08-13 ENCOUNTER — Other Ambulatory Visit: Payer: Self-pay

## 2019-08-13 ENCOUNTER — Ambulatory Visit (INDEPENDENT_AMBULATORY_CARE_PROVIDER_SITE_OTHER): Payer: Medicare Other | Admitting: Podiatry

## 2019-08-13 DIAGNOSIS — M79674 Pain in right toe(s): Secondary | ICD-10-CM | POA: Diagnosis not present

## 2019-08-13 DIAGNOSIS — M79675 Pain in left toe(s): Secondary | ICD-10-CM | POA: Diagnosis not present

## 2019-08-13 DIAGNOSIS — B351 Tinea unguium: Secondary | ICD-10-CM

## 2019-08-13 NOTE — Progress Notes (Signed)
This patient presents  to the office for evaluation and treatment of long thick painful nails .  This patient is unable to trim his own nails since the patient cannot reach his feet.  Patient says the nails are painful walking and wearing his shoes.  He presents  for preventive foot care services.  General Appearance  Alert, conversant and in no acute stress.  Vascular  Dorsalis pedis and posterior tibial  pulses are palpable  bilaterally.  Capillary return is within normal limits  bilaterally. Temperature is within normal limits  bilaterally.  Neurologic  Senn-Weinstein monofilament wire test within normal limits  bilaterally. Muscle power within normal limits bilaterally.  Nails Thick disfigured discolored nails with subungual debris  from hallux to fifth toes bilaterally. No evidence of bacterial infection or drainage bilaterally.  Orthopedic  No limitations of motion  feet .  No crepitus or effusions noted.  No bony pathology or digital deformities noted.  Skin  normotropic skin with no porokeratosis noted bilaterally.  No signs of infections or ulcers noted.     Onychomycosis  Pain in toes right foot  Pain in toes left foot  Debridement  of nails  1-5  B/L with a nail nipper.  Nails were then filed using a dremel tool with no incidents.    RTC  4 months   Gardiner Barefoot DPM

## 2019-10-25 DIAGNOSIS — L720 Epidermal cyst: Secondary | ICD-10-CM | POA: Diagnosis not present

## 2019-10-25 DIAGNOSIS — L309 Dermatitis, unspecified: Secondary | ICD-10-CM | POA: Diagnosis not present

## 2019-10-25 DIAGNOSIS — L821 Other seborrheic keratosis: Secondary | ICD-10-CM | POA: Diagnosis not present

## 2019-10-25 DIAGNOSIS — L570 Actinic keratosis: Secondary | ICD-10-CM | POA: Diagnosis not present

## 2019-10-25 DIAGNOSIS — C44329 Squamous cell carcinoma of skin of other parts of face: Secondary | ICD-10-CM | POA: Diagnosis not present

## 2019-11-15 DIAGNOSIS — Z85828 Personal history of other malignant neoplasm of skin: Secondary | ICD-10-CM | POA: Diagnosis not present

## 2019-11-15 DIAGNOSIS — C44329 Squamous cell carcinoma of skin of other parts of face: Secondary | ICD-10-CM | POA: Diagnosis not present

## 2019-12-14 ENCOUNTER — Other Ambulatory Visit: Payer: Self-pay

## 2019-12-14 ENCOUNTER — Encounter: Payer: Self-pay | Admitting: Podiatry

## 2019-12-14 ENCOUNTER — Ambulatory Visit (INDEPENDENT_AMBULATORY_CARE_PROVIDER_SITE_OTHER): Payer: Medicare Other | Admitting: Podiatry

## 2019-12-14 DIAGNOSIS — B351 Tinea unguium: Secondary | ICD-10-CM

## 2019-12-14 DIAGNOSIS — M79674 Pain in right toe(s): Secondary | ICD-10-CM

## 2019-12-14 DIAGNOSIS — M79675 Pain in left toe(s): Secondary | ICD-10-CM | POA: Diagnosis not present

## 2019-12-14 NOTE — Progress Notes (Signed)
This patient presents  to the office for evaluation and treatment of long thick painful nails .  This patient is unable to trim his own nails since the patient cannot reach his feet.  Patient says the nails are painful walking and wearing his shoes.  He presents  for preventive foot care services.  General Appearance  Alert, conversant and in no acute stress.  Vascular  Dorsalis pedis and posterior tibial  pulses are palpable  bilaterally.  Capillary return is within normal limits  bilaterally. Temperature is within normal limits  bilaterally.  Neurologic  Senn-Weinstein monofilament wire test within normal limits  bilaterally. Muscle power within normal limits bilaterally.  Nails Thick disfigured discolored nails with subungual debris  from hallux to fifth toes bilaterally. No evidence of bacterial infection or drainage bilaterally.  Orthopedic  No limitations of motion  feet .  No crepitus or effusions noted.  No bony pathology or digital deformities noted.  Skin  normotropic skin with no porokeratosis noted bilaterally.  No signs of infections or ulcers noted.     Onychomycosis  Pain in toes right foot  Pain in toes left foot  Debridement  of nails  1-5  B/L with a nail nipper.  Nails were then filed using a dremel tool with no incidents.    RTC  4 months   Gardiner Barefoot DPM

## 2019-12-24 DIAGNOSIS — H401131 Primary open-angle glaucoma, bilateral, mild stage: Secondary | ICD-10-CM | POA: Diagnosis not present

## 2020-01-10 DIAGNOSIS — E7800 Pure hypercholesterolemia, unspecified: Secondary | ICD-10-CM | POA: Diagnosis not present

## 2020-01-10 DIAGNOSIS — Z125 Encounter for screening for malignant neoplasm of prostate: Secondary | ICD-10-CM | POA: Diagnosis not present

## 2020-01-10 DIAGNOSIS — R972 Elevated prostate specific antigen [PSA]: Secondary | ICD-10-CM | POA: Diagnosis not present

## 2020-01-10 DIAGNOSIS — E119 Type 2 diabetes mellitus without complications: Secondary | ICD-10-CM | POA: Diagnosis not present

## 2020-01-17 DIAGNOSIS — R972 Elevated prostate specific antigen [PSA]: Secondary | ICD-10-CM | POA: Diagnosis not present

## 2020-01-17 DIAGNOSIS — E7800 Pure hypercholesterolemia, unspecified: Secondary | ICD-10-CM | POA: Diagnosis not present

## 2020-01-17 DIAGNOSIS — R809 Proteinuria, unspecified: Secondary | ICD-10-CM | POA: Diagnosis not present

## 2020-01-17 DIAGNOSIS — Z0000 Encounter for general adult medical examination without abnormal findings: Secondary | ICD-10-CM | POA: Diagnosis not present

## 2020-01-17 DIAGNOSIS — E1129 Type 2 diabetes mellitus with other diabetic kidney complication: Secondary | ICD-10-CM | POA: Diagnosis not present

## 2020-01-17 DIAGNOSIS — L219 Seborrheic dermatitis, unspecified: Secondary | ICD-10-CM | POA: Diagnosis not present

## 2020-01-17 DIAGNOSIS — M1990 Unspecified osteoarthritis, unspecified site: Secondary | ICD-10-CM | POA: Diagnosis not present

## 2020-01-17 DIAGNOSIS — R82998 Other abnormal findings in urine: Secondary | ICD-10-CM | POA: Diagnosis not present

## 2020-01-17 DIAGNOSIS — I10 Essential (primary) hypertension: Secondary | ICD-10-CM | POA: Diagnosis not present

## 2020-01-17 DIAGNOSIS — N3281 Overactive bladder: Secondary | ICD-10-CM | POA: Diagnosis not present

## 2020-01-17 DIAGNOSIS — H9193 Unspecified hearing loss, bilateral: Secondary | ICD-10-CM | POA: Diagnosis not present

## 2020-01-17 DIAGNOSIS — I4510 Unspecified right bundle-branch block: Secondary | ICD-10-CM | POA: Diagnosis not present

## 2020-01-17 DIAGNOSIS — N1832 Chronic kidney disease, stage 3b: Secondary | ICD-10-CM | POA: Diagnosis not present

## 2020-01-17 DIAGNOSIS — I1310 Hypertensive heart and chronic kidney disease without heart failure, with stage 1 through stage 4 chronic kidney disease, or unspecified chronic kidney disease: Secondary | ICD-10-CM | POA: Diagnosis not present

## 2020-03-02 DIAGNOSIS — H401131 Primary open-angle glaucoma, bilateral, mild stage: Secondary | ICD-10-CM | POA: Diagnosis not present

## 2020-03-02 DIAGNOSIS — H349 Unspecified retinal vascular occlusion: Secondary | ICD-10-CM | POA: Diagnosis not present

## 2020-03-03 DIAGNOSIS — H3582 Retinal ischemia: Secondary | ICD-10-CM | POA: Diagnosis not present

## 2020-03-03 DIAGNOSIS — H4312 Vitreous hemorrhage, left eye: Secondary | ICD-10-CM | POA: Diagnosis not present

## 2020-03-03 DIAGNOSIS — E113391 Type 2 diabetes mellitus with moderate nonproliferative diabetic retinopathy without macular edema, right eye: Secondary | ICD-10-CM | POA: Diagnosis not present

## 2020-03-03 DIAGNOSIS — E113592 Type 2 diabetes mellitus with proliferative diabetic retinopathy without macular edema, left eye: Secondary | ICD-10-CM | POA: Diagnosis not present

## 2020-03-07 ENCOUNTER — Inpatient Hospital Stay (HOSPITAL_COMMUNITY)
Admission: EM | Admit: 2020-03-07 | Discharge: 2020-03-13 | DRG: 522 | Disposition: A | Discharge status: Skilled Nursing Facility | Payer: Medicare Other | Attending: Internal Medicine | Admitting: Internal Medicine

## 2020-03-07 ENCOUNTER — Encounter (HOSPITAL_COMMUNITY): Payer: Self-pay

## 2020-03-07 ENCOUNTER — Emergency Department (HOSPITAL_COMMUNITY): Payer: Medicare Other

## 2020-03-07 ENCOUNTER — Inpatient Hospital Stay (HOSPITAL_COMMUNITY): Payer: Medicare Other

## 2020-03-07 ENCOUNTER — Other Ambulatory Visit: Payer: Self-pay

## 2020-03-07 DIAGNOSIS — F1021 Alcohol dependence, in remission: Secondary | ICD-10-CM | POA: Diagnosis present

## 2020-03-07 DIAGNOSIS — I4510 Unspecified right bundle-branch block: Secondary | ICD-10-CM | POA: Diagnosis not present

## 2020-03-07 DIAGNOSIS — R52 Pain, unspecified: Secondary | ICD-10-CM | POA: Diagnosis not present

## 2020-03-07 DIAGNOSIS — E7801 Familial hypercholesterolemia: Secondary | ICD-10-CM | POA: Diagnosis not present

## 2020-03-07 DIAGNOSIS — S0003XA Contusion of scalp, initial encounter: Secondary | ICD-10-CM | POA: Diagnosis present

## 2020-03-07 DIAGNOSIS — R338 Other retention of urine: Secondary | ICD-10-CM | POA: Diagnosis not present

## 2020-03-07 DIAGNOSIS — E785 Hyperlipidemia, unspecified: Secondary | ICD-10-CM | POA: Diagnosis not present

## 2020-03-07 DIAGNOSIS — R011 Cardiac murmur, unspecified: Secondary | ICD-10-CM | POA: Diagnosis present

## 2020-03-07 DIAGNOSIS — Z20822 Contact with and (suspected) exposure to covid-19: Secondary | ICD-10-CM | POA: Diagnosis present

## 2020-03-07 DIAGNOSIS — S72009A Fracture of unspecified part of neck of unspecified femur, initial encounter for closed fracture: Secondary | ICD-10-CM

## 2020-03-07 DIAGNOSIS — M1612 Unilateral primary osteoarthritis, left hip: Secondary | ICD-10-CM | POA: Diagnosis present

## 2020-03-07 DIAGNOSIS — R000 Tachycardia, unspecified: Secondary | ICD-10-CM | POA: Diagnosis not present

## 2020-03-07 DIAGNOSIS — I959 Hypotension, unspecified: Secondary | ICD-10-CM | POA: Diagnosis not present

## 2020-03-07 DIAGNOSIS — Z9114 Patient's other noncompliance with medication regimen: Secondary | ICD-10-CM

## 2020-03-07 DIAGNOSIS — W109XXA Fall (on) (from) unspecified stairs and steps, initial encounter: Secondary | ICD-10-CM | POA: Diagnosis present

## 2020-03-07 DIAGNOSIS — S0003XS Contusion of scalp, sequela: Secondary | ICD-10-CM | POA: Diagnosis not present

## 2020-03-07 DIAGNOSIS — M25552 Pain in left hip: Secondary | ICD-10-CM | POA: Diagnosis not present

## 2020-03-07 DIAGNOSIS — M2550 Pain in unspecified joint: Secondary | ICD-10-CM | POA: Diagnosis not present

## 2020-03-07 DIAGNOSIS — Z09 Encounter for follow-up examination after completed treatment for conditions other than malignant neoplasm: Secondary | ICD-10-CM

## 2020-03-07 DIAGNOSIS — N1330 Unspecified hydronephrosis: Secondary | ICD-10-CM | POA: Diagnosis present

## 2020-03-07 DIAGNOSIS — Z9049 Acquired absence of other specified parts of digestive tract: Secondary | ICD-10-CM | POA: Diagnosis not present

## 2020-03-07 DIAGNOSIS — Z466 Encounter for fitting and adjustment of urinary device: Secondary | ICD-10-CM | POA: Diagnosis not present

## 2020-03-07 DIAGNOSIS — N2889 Other specified disorders of kidney and ureter: Secondary | ICD-10-CM | POA: Diagnosis not present

## 2020-03-07 DIAGNOSIS — Z794 Long term (current) use of insulin: Secondary | ICD-10-CM | POA: Diagnosis not present

## 2020-03-07 DIAGNOSIS — Z79899 Other long term (current) drug therapy: Secondary | ICD-10-CM | POA: Diagnosis not present

## 2020-03-07 DIAGNOSIS — J301 Allergic rhinitis due to pollen: Secondary | ICD-10-CM | POA: Diagnosis present

## 2020-03-07 DIAGNOSIS — N400 Enlarged prostate without lower urinary tract symptoms: Secondary | ICD-10-CM | POA: Diagnosis not present

## 2020-03-07 DIAGNOSIS — M160 Bilateral primary osteoarthritis of hip: Secondary | ICD-10-CM | POA: Diagnosis present

## 2020-03-07 DIAGNOSIS — I951 Orthostatic hypotension: Secondary | ICD-10-CM | POA: Diagnosis present

## 2020-03-07 DIAGNOSIS — E1165 Type 2 diabetes mellitus with hyperglycemia: Secondary | ICD-10-CM | POA: Diagnosis present

## 2020-03-07 DIAGNOSIS — I129 Hypertensive chronic kidney disease with stage 1 through stage 4 chronic kidney disease, or unspecified chronic kidney disease: Secondary | ICD-10-CM | POA: Diagnosis present

## 2020-03-07 DIAGNOSIS — S72002D Fracture of unspecified part of neck of left femur, subsequent encounter for closed fracture with routine healing: Secondary | ICD-10-CM | POA: Diagnosis not present

## 2020-03-07 DIAGNOSIS — R5381 Other malaise: Secondary | ICD-10-CM | POA: Diagnosis not present

## 2020-03-07 DIAGNOSIS — S72145A Nondisplaced intertrochanteric fracture of left femur, initial encounter for closed fracture: Secondary | ICD-10-CM | POA: Diagnosis not present

## 2020-03-07 DIAGNOSIS — M25511 Pain in right shoulder: Secondary | ICD-10-CM | POA: Diagnosis not present

## 2020-03-07 DIAGNOSIS — D649 Anemia, unspecified: Secondary | ICD-10-CM

## 2020-03-07 DIAGNOSIS — D72829 Elevated white blood cell count, unspecified: Secondary | ICD-10-CM | POA: Diagnosis present

## 2020-03-07 DIAGNOSIS — E1122 Type 2 diabetes mellitus with diabetic chronic kidney disease: Secondary | ICD-10-CM | POA: Diagnosis present

## 2020-03-07 DIAGNOSIS — N1339 Other hydronephrosis: Secondary | ICD-10-CM | POA: Diagnosis present

## 2020-03-07 DIAGNOSIS — N184 Chronic kidney disease, stage 4 (severe): Secondary | ICD-10-CM | POA: Diagnosis present

## 2020-03-07 DIAGNOSIS — Z419 Encounter for procedure for purposes other than remedying health state, unspecified: Secondary | ICD-10-CM

## 2020-03-07 DIAGNOSIS — Z9181 History of falling: Secondary | ICD-10-CM | POA: Diagnosis not present

## 2020-03-07 DIAGNOSIS — N179 Acute kidney failure, unspecified: Secondary | ICD-10-CM

## 2020-03-07 DIAGNOSIS — I160 Hypertensive urgency: Secondary | ICD-10-CM | POA: Diagnosis present

## 2020-03-07 DIAGNOSIS — Z8673 Personal history of transient ischemic attack (TIA), and cerebral infarction without residual deficits: Secondary | ICD-10-CM

## 2020-03-07 DIAGNOSIS — Z87891 Personal history of nicotine dependence: Secondary | ICD-10-CM

## 2020-03-07 DIAGNOSIS — W19XXXA Unspecified fall, initial encounter: Secondary | ICD-10-CM | POA: Diagnosis not present

## 2020-03-07 DIAGNOSIS — R1900 Intra-abdominal and pelvic swelling, mass and lump, unspecified site: Secondary | ICD-10-CM

## 2020-03-07 DIAGNOSIS — Z96642 Presence of left artificial hip joint: Secondary | ICD-10-CM | POA: Diagnosis not present

## 2020-03-07 DIAGNOSIS — N401 Enlarged prostate with lower urinary tract symptoms: Secondary | ICD-10-CM | POA: Diagnosis not present

## 2020-03-07 DIAGNOSIS — D638 Anemia in other chronic diseases classified elsewhere: Secondary | ICD-10-CM | POA: Diagnosis not present

## 2020-03-07 DIAGNOSIS — N138 Other obstructive and reflux uropathy: Secondary | ICD-10-CM | POA: Diagnosis not present

## 2020-03-07 DIAGNOSIS — S72002A Fracture of unspecified part of neck of left femur, initial encounter for closed fracture: Secondary | ICD-10-CM | POA: Diagnosis not present

## 2020-03-07 DIAGNOSIS — Z7401 Bed confinement status: Secondary | ICD-10-CM | POA: Diagnosis not present

## 2020-03-07 DIAGNOSIS — D631 Anemia in chronic kidney disease: Secondary | ICD-10-CM | POA: Diagnosis not present

## 2020-03-07 DIAGNOSIS — H409 Unspecified glaucoma: Secondary | ICD-10-CM | POA: Diagnosis present

## 2020-03-07 DIAGNOSIS — I10 Essential (primary) hypertension: Secondary | ICD-10-CM | POA: Diagnosis present

## 2020-03-07 DIAGNOSIS — R0902 Hypoxemia: Secondary | ICD-10-CM | POA: Diagnosis not present

## 2020-03-07 DIAGNOSIS — K5900 Constipation, unspecified: Secondary | ICD-10-CM | POA: Diagnosis not present

## 2020-03-07 DIAGNOSIS — Z01818 Encounter for other preprocedural examination: Secondary | ICD-10-CM | POA: Diagnosis not present

## 2020-03-07 DIAGNOSIS — E7800 Pure hypercholesterolemia, unspecified: Secondary | ICD-10-CM | POA: Diagnosis not present

## 2020-03-07 LAB — HEMOGLOBIN A1C
Hgb A1c MFr Bld: 6.7 % — ABNORMAL HIGH (ref 4.8–5.6)
Mean Plasma Glucose: 145.59 mg/dL

## 2020-03-07 LAB — TYPE AND SCREEN
ABO/RH(D): A POS
Antibody Screen: NEGATIVE

## 2020-03-07 LAB — CBC WITH DIFFERENTIAL/PLATELET
Abs Immature Granulocytes: 0.05 10*3/uL (ref 0.00–0.07)
Basophils Absolute: 0 10*3/uL (ref 0.0–0.1)
Basophils Relative: 0 %
Eosinophils Absolute: 0 10*3/uL (ref 0.0–0.5)
Eosinophils Relative: 0 %
HCT: 35.3 % — ABNORMAL LOW (ref 39.0–52.0)
Hemoglobin: 11.5 g/dL — ABNORMAL LOW (ref 13.0–17.0)
Immature Granulocytes: 0 %
Lymphocytes Relative: 4 %
Lymphs Abs: 0.5 10*3/uL — ABNORMAL LOW (ref 0.7–4.0)
MCH: 28.8 pg (ref 26.0–34.0)
MCHC: 32.6 g/dL (ref 30.0–36.0)
MCV: 88.3 fL (ref 80.0–100.0)
Monocytes Absolute: 1.1 10*3/uL — ABNORMAL HIGH (ref 0.1–1.0)
Monocytes Relative: 9 %
Neutro Abs: 10.6 10*3/uL — ABNORMAL HIGH (ref 1.7–7.7)
Neutrophils Relative %: 87 %
Platelets: 229 10*3/uL (ref 150–400)
RBC: 4 MIL/uL — ABNORMAL LOW (ref 4.22–5.81)
RDW: 13.3 % (ref 11.5–15.5)
WBC: 12.3 10*3/uL — ABNORMAL HIGH (ref 4.0–10.5)
nRBC: 0 % (ref 0.0–0.2)

## 2020-03-07 LAB — COMPREHENSIVE METABOLIC PANEL
ALT: 15 U/L (ref 0–44)
AST: 23 U/L (ref 15–41)
Albumin: 4.2 g/dL (ref 3.5–5.0)
Alkaline Phosphatase: 92 U/L (ref 38–126)
Anion gap: 11 (ref 5–15)
BUN: 38 mg/dL — ABNORMAL HIGH (ref 8–23)
CO2: 19 mmol/L — ABNORMAL LOW (ref 22–32)
Calcium: 8.6 mg/dL — ABNORMAL LOW (ref 8.9–10.3)
Chloride: 109 mmol/L (ref 98–111)
Creatinine, Ser: 2.06 mg/dL — ABNORMAL HIGH (ref 0.61–1.24)
GFR, Estimated: 31 mL/min — ABNORMAL LOW (ref >60)
Glucose, Bld: 164 mg/dL — ABNORMAL HIGH (ref 70–99)
Potassium: 4.6 mmol/L (ref 3.5–5.1)
Sodium: 139 mmol/L (ref 135–145)
Total Bilirubin: 0.6 mg/dL (ref 0.3–1.2)
Total Protein: 7.8 g/dL (ref 6.5–8.1)

## 2020-03-07 LAB — URINALYSIS, ROUTINE W REFLEX MICROSCOPIC
Bacteria, UA: NONE SEEN
Bilirubin Urine: NEGATIVE
Glucose, UA: NEGATIVE mg/dL
Ketones, ur: NEGATIVE mg/dL
Leukocytes,Ua: NEGATIVE
Nitrite: NEGATIVE
Protein, ur: 100 mg/dL — AB
Specific Gravity, Urine: 1.012 (ref 1.005–1.030)
pH: 5 (ref 5.0–8.0)

## 2020-03-07 LAB — PROTIME-INR
INR: 1.1 (ref 0.8–1.2)
Prothrombin Time: 13.8 seconds (ref 11.4–15.2)

## 2020-03-07 LAB — LIPID PANEL
Cholesterol: 223 mg/dL — ABNORMAL HIGH (ref 0–200)
HDL: 45 mg/dL (ref >40)
LDL Cholesterol: 155 mg/dL — ABNORMAL HIGH (ref 0–99)
Total CHOL/HDL Ratio: 5 RATIO
Triglycerides: 114 mg/dL (ref <150)
VLDL: 23 mg/dL (ref 0–40)

## 2020-03-07 LAB — TROPONIN I (HIGH SENSITIVITY): Troponin I (High Sensitivity): 8 ng/L (ref <18)

## 2020-03-07 LAB — RESP PANEL BY RT-PCR (FLU A&B, COVID) ARPGX2
Influenza A by PCR: NEGATIVE
Influenza B by PCR: NEGATIVE
SARS Coronavirus 2 by RT PCR: NEGATIVE

## 2020-03-07 LAB — ABO/RH: ABO/RH(D): A POS

## 2020-03-07 LAB — TSH: TSH: 0.278 u[IU]/mL — ABNORMAL LOW (ref 0.350–4.500)

## 2020-03-07 MED ORDER — LABETALOL HCL 5 MG/ML IV SOLN
10.0000 mg | INTRAVENOUS | Status: DC | PRN
  Administered 2020-03-07 (×2): 10 mg via INTRAVENOUS
  Filled 2020-03-07 (×4): qty 4

## 2020-03-07 MED ORDER — HYDRALAZINE HCL 25 MG PO TABS: 25.0000 mg | ORAL_TABLET | ORAL | Status: DC | PRN

## 2020-03-07 MED ORDER — OXYCODONE HCL 5 MG PO TABS
5.0000 mg | ORAL_TABLET | ORAL | Status: DC | PRN
  Administered 2020-03-08 – 2020-03-13 (×17): 5 mg via ORAL
  Filled 2020-03-07 (×20): qty 1

## 2020-03-07 MED ORDER — TAMSULOSIN HCL 0.4 MG PO CAPS
0.4000 mg | ORAL_CAPSULE | Freq: Every day | ORAL | Status: DC
  Administered 2020-03-07 – 2020-03-13 (×6): 0.4 mg via ORAL
  Filled 2020-03-07 (×6): qty 1

## 2020-03-07 MED ORDER — ACETAMINOPHEN 650 MG RE SUPP: 650.0000 mg | Freq: Four times a day (QID) | RECTAL | Status: DC | PRN

## 2020-03-07 MED ORDER — SODIUM CHLORIDE 0.9% FLUSH
3.0000 mL | Freq: Two times a day (BID) | INTRAVENOUS | Status: DC
  Administered 2020-03-07 – 2020-03-13 (×10): 3 mL via INTRAVENOUS

## 2020-03-07 MED ORDER — AMLODIPINE BESYLATE 10 MG PO TABS
10.0000 mg | ORAL_TABLET | Freq: Every day | ORAL | Status: DC
  Administered 2020-03-07 – 2020-03-10 (×4): 10 mg via ORAL
  Filled 2020-03-07 (×4): qty 1
  Filled 2020-03-07: qty 2

## 2020-03-07 MED ORDER — ENOXAPARIN SODIUM 40 MG/0.4ML ~~LOC~~ SOLN: 40.0000 mg | Freq: Once | SUBCUTANEOUS | Status: DC

## 2020-03-07 MED ORDER — LACTATED RINGERS IV SOLN: INTRAVENOUS | Status: DC

## 2020-03-07 MED ORDER — ACETAMINOPHEN 325 MG PO TABS
650.0000 mg | ORAL_TABLET | Freq: Four times a day (QID) | ORAL | Status: DC | PRN
  Administered 2020-03-12 – 2020-03-13 (×4): 650 mg via ORAL
  Filled 2020-03-07 (×5): qty 2

## 2020-03-07 MED ORDER — HYDROMORPHONE HCL 1 MG/ML IJ SOLN
1.0000 mg | Freq: Once | INTRAMUSCULAR | Status: AC
  Administered 2020-03-07: 1 mg via INTRAVENOUS
  Filled 2020-03-07: qty 1

## 2020-03-07 MED ORDER — HEPARIN SODIUM (PORCINE) 5000 UNIT/ML IJ SOLN
5000.0000 [IU] | Freq: Three times a day (TID) | INTRAMUSCULAR | Status: DC
  Administered 2020-03-07 (×2): 5000 [IU] via SUBCUTANEOUS
  Filled 2020-03-07 (×2): qty 1

## 2020-03-07 MED ORDER — SENNOSIDES-DOCUSATE SODIUM 8.6-50 MG PO TABS
1.0000 | ORAL_TABLET | Freq: Two times a day (BID) | ORAL | Status: DC | PRN
  Administered 2020-03-12 (×2): 1 via ORAL
  Filled 2020-03-07 (×2): qty 1

## 2020-03-07 NOTE — Assessment & Plan Note (Signed)
-   creat in 12/2014 was 2.28 with GFR 25; currently creat is 2.06. Likely has stage IV due to untreated and uncontrolled HTN and/or DM - follow up creat response to IVF - check renal u/s

## 2020-03-07 NOTE — ED Provider Notes (Signed)
[facility] EMERGENCY DEPARTMENT Provider Note  CSN: [id number] Arrival date & time: 03/07/20 1133    History Chief Complaint  Patient presents with  . Fall    HPI  John Walton is a 86 y.o. male who reports he lost his balance and fell at home last night around 10pm. His wife and her son helped him to the bathroom and to bed but he was in so much pain this morning the [facility] come [facility] for evaluation. He hit his head, R shoulder and L hip when he fell. Complaining only of L hip pain this morning. Denies any recent illness, noted to be febrile by EMS but not febrile on arrival here.    Past Medical History:  Diagnosis Date  . Chicken pox   . Frequent headaches   . Glaucoma   . Hay fever   . History of blood transfusion   . HTN (hypertension)   . Hyperlipidemia   . Hyperlipidemia   . Migraines   . 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[facility])     Past Surgical History:  Procedure Laterality Date  . CHOLECYSTECTOMY, LAPAROSCOPIC    . TONSILLECTOMY  1942    Family History  Problem Relation Age of Onset  . Liver disease Mother   . Skin cancer Father   . Skin cancer Paternal Grandfather   . Diabetes Maternal Grandfather     Social History   Tobacco Use  . Smoking status: Former Smoker    Start date: 04/01/1968    Quit date: 01/11/1977    Years since quitting: 43.1  . Smokeless tobacco: Never Used  Substance Use Topics  . Alcohol use: No  . Drug use: No     Home Medications Prior to Admission medications   Medication Sig Start Date End Date Taking? Authorizing Provider  latanoprost (XALATAN) 0.005 % ophthalmic solution  03/13/19   [provider]  SIMBRINZA 1-0.2 % SUSP  05/16/19   [provider]     Allergies    Patient has no known allergies.   Review of Systems   Review of Systems A comprehensive review of systems was completed and negative except as noted in HPI.    Physical Exam BP (!) 181/83   Pulse 94    Temp 98.8 F (37.1 C) (Oral)   Resp 18   SpO2 96%   Physical Exam Vitals and nursing note reviewed.  Constitutional:      Appearance: Normal appearance.  HENT:     Head: Normocephalic.     Comments: Contusion R forehead, sebaceous cyst R forehead    Nose: Nose normal.     Mouth/Throat:     Mouth: Mucous membranes are moist.  Eyes:     Extraocular Movements: Extraocular movements intact.     Conjunctiva/sclera: Conjunctivae normal.  Cardiovascular:     Rate and Rhythm: Normal rate.  Pulmonary:     Effort: Pulmonary effort is normal.     Breath sounds: Normal breath sounds.  Abdominal:     General: Abdomen is flat.     Palpations: Abdomen is soft.     Tenderness: There is no abdominal tenderness.  Musculoskeletal:        General: No swelling.     Cervical back: Normal range of motion and neck supple. No tenderness.     Comments: Pain to R anterior shoulder, FROM, no deformity; tender over the L hip, leg is foreshortened and externally rotated, NVI  Skin:  General: Skin is warm and dry.  Neurological:     General: No focal deficit present.     Mental Status: He is alert.  Psychiatric:        Mood and Affect: Mood normal.      ED Results / Procedures / Treatments   Labs (all labs ordered are listed, but only abnormal results are displayed) Labs Reviewed  CBC WITH DIFFERENTIAL/PLATELET - Abnormal; Notable for the following components:      Result Value   WBC 12.3 (*)    RBC 4.00 (*)    Hemoglobin 11.5 (*)    HCT 35.3 (*)    Neutro Abs 10.6 (*)    Lymphs Abs 0.5 (*)    Monocytes Absolute 1.1 (*)    All other components within normal limits  COMPREHENSIVE METABOLIC PANEL - Abnormal; Notable for the following components:   CO2 19 (*)    Glucose, Bld 164 (*)    BUN 38 (*)    Creatinine, Ser 2.06 (*)    Calcium 8.6 (*)    GFR, Estimated 31 (*)    All other components within normal limits  URINALYSIS, ROUTINE W REFLEX MICROSCOPIC - Abnormal; Notable for the  following components:   Hgb urine dipstick SMALL (*)    Protein, ur 100 (*)    All other components within normal limits  RESP PANEL BY RT-PCR (FLU A&B, COVID) ARPGX2  PROTIME-INR  TYPE AND SCREEN  ABO/RH    EKG EKG Interpretation  Date/Time:  Tuesday March 07 2020 11:52:35 EST Ventricular Rate:  102 PR Interval:    QRS Duration: 140 QT Interval:  379 QTC Calculation: 494 R Axis:   -74 Text Interpretation: Sinus tachycardia Right bundle branch block No significant change since last tracing Confirmed by Calvert Cantor [phone]) on 03/07/2020 12:01:45 PM   Radiology DG Chest 1 View  Result Date: 03/07/2020 CLINICAL DATA:  Preop exam. EXAM: CHEST  1 VIEW COMPARISON:  Chest x-ray 4 06/20/1998. FINDINGS: Mediastinum and hilar structures normal. Heart size normal. Low lung volumes with mild left base subsegmental axis. No pleural effusion or pneumothorax. Degenerative change thoracic spine and both shoulders. IMPRESSION: Low lung volumes with mild left base subsegmental atelectasis. Electronically Signed   By: Marcello Moores  Register   On: 03/07/2020 13:07   DG Shoulder Right  Result Date: 03/07/2020 CLINICAL DATA:  Right shoulder pain after a fall last night. EXAM: RIGHT SHOULDER - 2+ VIEW COMPARISON:  None. FINDINGS: No acute fracture or dislocation is identified. Mild-to-moderate joint space narrowing and marginal spurring are noted at the acromioclavicular joint. Narrowing of the acromial humeral interval could indicate a rotator cuff tear. IMPRESSION: No acute osseous abnormality identified. Electronically Signed   By: Logan Bores M.D.   On: 03/07/2020 13:05   CT HEAD WO CONTRAST  Result Date: 03/07/2020 CLINICAL DATA:  Head trauma, minor (Age >= 65y) EXAM: CT HEAD WITHOUT CONTRAST TECHNIQUE: Contiguous axial images were obtained from the base of the skull through the vertex without intravenous contrast. COMPARISON:  04/23/2015 and prior FINDINGS: Brain: No acute infarct or intracranial  hemorrhage. No mass lesion. No midline shift, ventriculomegaly or extra-axial fluid collection. Mild cerebral atrophy with ex vacuo dilatation. Chronic microvascular ischemic changes. Remote right frontal insult. Vascular: No hyperdense vessel or unexpected calcification. Bilateral carotid siphon atherosclerotic calcifications. Skull: Negative for fracture or focal lesion. Sinuses/Orbits: No acute orbital finding. Pneumatized paranasal sinuses and mastoid air cells. Other: Right frontal scalp hematoma. IMPRESSION: 1. No acute intracranial process.  Remote  right frontal insult. 2. Mild cerebral atrophy and chronic microvascular ischemic changes. 3. Right frontal scalp hematoma. Electronically Signed   By: Primitivo Gauze M.D.   On: 03/07/2020 14:49   CT HIP LEFT WO CONTRAST  Result Date: 03/07/2020 CLINICAL DATA:  Left hip pain after fall.  Abnormal x-ray. EXAM: CT OF THE LEFT HIP WITHOUT CONTRAST TECHNIQUE: Multidetector CT imaging of the left hip was performed according to the standard protocol. Multiplanar CT image reconstructions were also generated. COMPARISON:  X-ray 03/07/2020 FINDINGS: Bones/Joint/Cartilage Acute nondisplaced fracture of the left femoral neck and intertrochanteric region. No significant impaction or angulation. No fracture involvement of the femoral head. Severe osteoarthritis of the left hip joint with complete joint space loss, subchondral sclerosis/cystic change, and marginal osteophyte formation. Visualized portion of the left hemipelvis is intact without fracture. The inferior aspect of the left SI joint is intact without diastasis. Ligaments Suboptimally assessed by CT. Muscles and Tendons No acute musculotendinous abnormality by CT. Soft tissues There is induration within the subcutaneous soft tissues overlying the greater trochanter without organized soft tissue collection or hematoma. Visualized prostate gland is enlarged. Scattered vascular calcifications. IMPRESSION: 1.  Acute nondisplaced fracture of the left femoral neck and intertrochanteric region. 2. 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 3. Enlarged prostate gland. Electronically Signed   By: Davina Poke D.O.   On: 03/07/2020 15:31   DG Hip Unilat With Pelvis 2-3 Views Left  Result Date: 03/07/2020 CLINICAL DATA:  Left hip pain after fall EXAM: DG HIP (WITH OR WITHOUT PELVIS) 2-3V LEFT COMPARISON:  None. FINDINGS: Cortical irregularity along the lateral aspect of the left femoral neck suspicious for a nondisplaced fracture. No displacement or angulation. Severe degenerative changes of the bilateral hip joints. Pelvic bony ring intact. Advanced vascular calcifications. IMPRESSION: Findings suspicious for a nondisplaced fracture of the left femoral neck. Electronically Signed   By: Davina Poke D.O.   On: 03/07/2020 13:04    Procedures Procedures  Medications Ordered in the ED Medications  HYDROmorphone (DILAUDID) injection 1 mg (1 mg Intravenous Given 03/07/20 1344)     MDM Rules/Calculators/A&P MDM Patient with mechanical fall, complaining mostly of L hip pain. Suspect hip fracture, will also image head and R shoulder. He declines pain medications now. States the only medications he takes are herbal remedies he makes himself through his own pharmaceutical company.  ED Course  I have reviewed the triage vital signs and the nursing notes.  Pertinent labs & imaging results that were available during my care of the patient were reviewed by me and considered in my medical decision making (see chart for details).  Clinical Course as of Mar 07 1610  Tue Mar 07, 2020  1227 CBC with mild leukocytosis.    [CS]  63 CMP with CKD similar to previous   [CS]  1301 Hip xray appears to show inter-trochanteric fracture by my interpretation. CXR ordered as well for anticipated pre-op eval.    [CS]  1328 UA is neg.    [CS]  3557 Covid/Flu is neg.    [CS]  3220 Spoke with Dr. Lorin Mercy, Ortho, who  has reviewed films and requests a CT scan to confirm possible fracture not definitively seen on Xray.    [CS]  1510 Inadvertently ordered CT right hip instead of left. CT tech indicates she can fix that on her end.    [CS]  2542 CT confirms L hip fracture. Will discuss with Hospitalist to admit. Dr. Lorin Mercy aware.    [CS]  102 Spoke with Dr. Sabino Gasser, Hospitalist, who will evaluate for admission.    [CS]    Clinical Course User Index [CS] Truddie Hidden, MD    Final Clinical Impression(s) / ED Diagnoses Final diagnoses:  Closed fracture of left hip, initial encounter [facility])    Rx / DC Orders ED Discharge Orders    None       Truddie Hidden, MD 03/07/20 1611

## 2020-03-07 NOTE — ED Notes (Signed)
Transporter called to take this pt upstairs.

## 2020-03-07 NOTE — H&P (View-Only) (Signed)
Reason for Consult:left hip pain Referring Physician: ED  John Walton is an 86 y.o. male.  HPI: 86 year old white male was brought to the emergency room with complaints of left hip pain.  Wife present with him.  States that last night while there at home she heard her husband fall to the floor.  He was unable to ambulate due to complaints of left hip pain.  Son who lives at the home was able to help get him up off the floor.  Wife does state that he has had chronic left hip/groin pain over the last 20+ years after a previous injury. CT left hip in the ED showed:  IMPRESSION: 1. Acute nondisplaced fracture of the left femoral neck and intertrochanteric region. 2. 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 3. Enlarged prostate gland.  Past Medical History:  Diagnosis Date  . Chicken pox   . Frequent headaches   . Glaucoma   . Hay fever   . History of blood transfusion   . HTN (hypertension)   . Hyperlipidemia   . Hyperlipidemia   . Migraines   . 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[facility])     Past Surgical History:  Procedure Laterality Date  . CHOLECYSTECTOMY, LAPAROSCOPIC    . TONSILLECTOMY  1942    Family History  Problem Relation Age of Onset  . Liver disease Mother   . Skin cancer Father   . Skin cancer Paternal Grandfather   . Diabetes Maternal Grandfather     Social History:  reports that he quit smoking about 43 years ago. He started smoking about 51 years ago. He has never used smokeless tobacco. He reports that he does not drink alcohol and does not use drugs.  Allergies: No Known Allergies  Medications: I have reviewed the patient's current medications.  Results for orders placed or performed during the hospital encounter of 03/07/20 (from the past 48 hour(s))  CBC WITH DIFFERENTIAL     Status: Abnormal   Collection Time: 03/07/20 11:56 AM  Result Value Ref Range   WBC 12.3 (H) 4.0 - 10.5 K/uL   RBC 4.00 (L) 4.22 - 5.81 MIL/uL   Hemoglobin 11.5 (L) 13.0  - 17.0 g/dL   HCT 35.3 (L) 39 - 52 %   MCV 88.3 80.0 - 100.0 fL   MCH 28.8 26.0 - 34.0 pg   MCHC 32.6 30.0 - 36.0 g/dL   RDW 13.3 11.5 - 15.5 %   Platelets 229 150 - 400 K/uL   nRBC 0.0 0.0 - 0.2 %   Neutrophils Relative % 87 %   Neutro Abs 10.6 (H) 1.7 - 7.7 K/uL   Lymphocytes Relative 4 %   Lymphs Abs 0.5 (L) 0.7 - 4.0 K/uL   Monocytes Relative 9 %   Monocytes Absolute 1.1 (H) 0.1 - 1.0 K/uL   Eosinophils Relative 0 %   Eosinophils Absolute 0.0 0.0 - 0.5 K/uL   Basophils Relative 0 %   Basophils Absolute 0.0 0.0 - 0.1 K/uL   Immature Granulocytes 0 %   Abs Immature Granulocytes 0.05 0.00 - 0.07 K/uL    Comment: Performed at [facility], [place] [address] [place], [place] [address]  Protime-INR     Status: None   Collection Time: 03/07/20 11:56 AM  Result Value Ref Range   Prothrombin Time 13.8 11.4 - 15.2 seconds   INR 1.1 0.8 - 1.2    Comment: (NOTE) INR goal varies based on device and disease states. Performed at [facility]  [place] [address], [place], [place] [address]   Comprehensive metabolic panel     Status: Abnormal   Collection Time: 03/07/20 11:56 AM  Result Value Ref Range   Sodium 139 135 - 145 mmol/L   Potassium 4.6 3.5 - 5.1 mmol/L   Chloride 109 98 - 111 mmol/L   CO2 19 (L) 22 - 32 mmol/L   Glucose, Bld 164 (H) 70 - 99 mg/dL    Comment: Glucose reference range applies only to samples taken after fasting for at least 8 hours.   BUN 38 (H) 8 - 23 mg/dL   Creatinine, Ser 2.06 (H) 0.61 - 1.24 mg/dL   Calcium 8.6 (L) 8.9 - 10.3 mg/dL   Total Protein 7.8 6.5 - 8.1 g/dL   Albumin 4.2 3.5 - 5.0 g/dL   AST 23 15 - 41 U/L   ALT 15 0 - 44 U/L   Alkaline Phosphatase 92 38 - 126 U/L   Total Bilirubin 0.6 0.3 - 1.2 mg/dL   GFR, Estimated 31 (L) >60 mL/min    Comment: (NOTE) Calculated using the CKD-EPI Creatinine Equation (2021)    Anion gap 11 5 - 15    Comment: Performed at [facility], [place] [address], [place], [place] [address]  ABO/Rh     Status: None   Collection Time: 03/07/20 11:56 AM  Result Value Ref Range   ABO/RH(D)      A POS Performed at [facility], [place] [address], [place], [place] [address]   Resp Panel by RT-PCR (Flu A&B, Covid) Nasopharyngeal Swab     Status: None   Collection Time: 03/07/20 11:57 AM   Specimen: Nasopharyngeal Swab; Nasopharyngeal(NP) swabs in vial transport medium  Result Value Ref Range   SARS Coronavirus 2 by RT PCR NEGATIVE NEGATIVE    Comment: (NOTE) SARS-CoV-2 target nucleic acids are NOT DETECTED.  The SARS-CoV-2 RNA is generally detectable in upper respiratory specimens during the acute phase of infection. The lowest concentration of SARS-CoV-2 viral copies this assay can detect is 138 copies/mL. A negative result does not preclude SARS-Cov-2 infection and should not be used as the sole basis for treatment or other patient management decisions. A negative result may occur with  improper specimen collection/handling, submission of specimen other than nasopharyngeal swab, presence of viral mutation(s) within the areas targeted by this assay, and inadequate number of viral copies(<138 copies/mL). A negative result must be combined with clinical observations, patient history, and epidemiological information. The expected result is Negative.  Fact Sheet for Patients:  EntrepreneurPulse.com.au  Fact Sheet for Healthcare Providers:  IncredibleEmployment.be  This test is no t yet approved or cleared by the [place] FDA and  has been authorized for detection and/or diagnosis of SARS-CoV-2 by FDA under an Emergency Use Authorization (EUA). This EUA will remain  in effect (meaning this test can be used) for the duration of the COVID-19 declaration under Section 564(b)(1) of the Act, 21 U.S.C.section 360bbb-3(b)(1), unless the authorization is terminated  or revoked sooner.        Influenza A by PCR NEGATIVE NEGATIVE   Influenza B by PCR NEGATIVE NEGATIVE    Comment: (NOTE) The Xpert Xpress SARS-CoV-2/FLU/RSV plus assay is intended as an aid in the diagnosis of influenza from Nasopharyngeal swab specimens and should not be used as a sole basis for treatment. Nasal washings and aspirates are unacceptable for Xpert Xpress SARS-CoV-2/FLU/RSV testing.  Fact Sheet for Patients: EntrepreneurPulse.com.au  Fact Sheet for  Healthcare Providers: IncredibleEmployment.be  This test is not yet approved or cleared by the [place] and has been authorized for detection and/or diagnosis of SARS-CoV-2 by FDA under an Emergency Use Authorization (EUA). This EUA will remain in effect (meaning this test can be used) for the duration of the COVID-19 declaration under Section 564(b)(1) of the Act, 21 U.S.C. section 360bbb-3(b)(1), unless the authorization is terminated or revoked.  Performed at [facility], [place] [address] [place], [place] [address]   Type and screen [place]     Status: None   Collection Time: 03/07/20 12:19 PM  Result Value Ref Range   ABO/RH(D) A POS    Antibody Screen NEG    Sample Expiration      03/10/2020,2359 Performed at [facility], [place] [address], [place], [place] [address]   Urinalysis, Routine w reflex microscopic     Status: Abnormal   Collection Time: 03/07/20 12:50 PM  Result Value Ref Range   Color, Urine YELLOW YELLOW   APPearance CLEAR CLEAR   Specific Gravity, Urine 1.012 1.005 - 1.030   pH 5.0 5.0 - 8.0   Glucose, UA NEGATIVE NEGATIVE mg/dL   Hgb urine dipstick SMALL (A) NEGATIVE   Bilirubin Urine NEGATIVE NEGATIVE   Ketones, ur NEGATIVE NEGATIVE mg/dL   Protein, ur 100 (A) NEGATIVE mg/dL   Nitrite NEGATIVE NEGATIVE   Leukocytes,Ua NEGATIVE NEGATIVE   RBC / HPF 0-5 0 - 5 RBC/hpf   WBC, UA 0-5 0 - 5 WBC/hpf   Bacteria, UA  NONE SEEN NONE SEEN   Squamous Epithelial / LPF 0-5 0 - 5   Mucus PRESENT     Comment: Performed at [facility], [place] [address] [place], [place] [address]    DG Chest 1 View  Result Date: 03/07/2020 CLINICAL DATA:  Preop exam. EXAM: CHEST  1 VIEW COMPARISON:  Chest x-ray 4 06/20/1998. FINDINGS: Mediastinum and hilar structures normal. Heart size normal. Low lung volumes with mild left base subsegmental axis. No pleural effusion or pneumothorax. Degenerative change thoracic spine and both shoulders. IMPRESSION: Low lung volumes with mild left base subsegmental atelectasis. Electronically Signed   By: Marcello Moores  Register   On: 03/07/2020 13:07   DG Shoulder Right  Result Date: 03/07/2020 CLINICAL DATA:  Right shoulder pain after a fall last night. EXAM: RIGHT SHOULDER - 2+ VIEW COMPARISON:  None. FINDINGS: No acute fracture or dislocation is identified. Mild-to-moderate joint space narrowing and marginal spurring are noted at the acromioclavicular joint. Narrowing of the acromial humeral interval could indicate a rotator cuff tear. IMPRESSION: No acute osseous abnormality identified. Electronically Signed   By: Logan Bores M.D.   On: 03/07/2020 13:05   CT HEAD WO CONTRAST  Result Date: 03/07/2020 CLINICAL DATA:  Head trauma, minor (Age >= 65y) EXAM: CT HEAD WITHOUT CONTRAST TECHNIQUE: Contiguous axial images were obtained from the base of the skull through the vertex without intravenous contrast. COMPARISON:  04/23/2015 and prior FINDINGS: Brain: No acute infarct or intracranial hemorrhage. No mass lesion. No midline shift, ventriculomegaly or extra-axial fluid collection. Mild cerebral atrophy with ex vacuo dilatation. Chronic microvascular ischemic changes. Remote right frontal insult. Vascular: No hyperdense vessel or unexpected calcification. Bilateral carotid siphon atherosclerotic calcifications. Skull: Negative for fracture or focal lesion. Sinuses/Orbits: No acute orbital  finding. Pneumatized paranasal sinuses and mastoid air cells. Other: Right frontal scalp hematoma. IMPRESSION: 1. No acute intracranial process.  Remote right frontal insult. 2. Mild cerebral atrophy  and chronic microvascular ischemic changes. 3. Right frontal scalp hematoma. Electronically Signed   By: Primitivo Gauze M.D.   On: 03/07/2020 14:49   CT HIP LEFT WO CONTRAST  Result Date: 03/07/2020 CLINICAL DATA:  Left hip pain after fall.  Abnormal x-ray. EXAM: CT OF THE LEFT HIP WITHOUT CONTRAST TECHNIQUE: Multidetector CT imaging of the left hip was performed according to the standard protocol. Multiplanar CT image reconstructions were also generated. COMPARISON:  X-ray 03/07/2020 FINDINGS: Bones/Joint/Cartilage Acute nondisplaced fracture of the left femoral neck and intertrochanteric region. No significant impaction or angulation. No fracture involvement of the femoral head. Severe osteoarthritis of the left hip joint with complete joint space loss, subchondral sclerosis/cystic change, and marginal osteophyte formation. Visualized portion of the left hemipelvis is intact without fracture. The inferior aspect of the left SI joint is intact without diastasis. Ligaments Suboptimally assessed by CT. Muscles and Tendons No acute musculotendinous abnormality by CT. Soft tissues There is induration within the subcutaneous soft tissues overlying the greater trochanter without organized soft tissue collection or hematoma. Visualized prostate gland is enlarged. Scattered vascular calcifications. IMPRESSION: 1. Acute nondisplaced fracture of the left femoral neck and intertrochanteric region. 2. 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 3. Enlarged prostate gland. Electronically Signed   By: Davina Poke D.O.   On: 03/07/2020 15:31   DG Hip Unilat With Pelvis 2-3 Views Left  Result Date: 03/07/2020 CLINICAL DATA:  Left hip pain after fall EXAM: DG HIP (WITH OR WITHOUT PELVIS) 2-3V LEFT COMPARISON:  None.  FINDINGS: Cortical irregularity along the lateral aspect of the left femoral neck suspicious for a nondisplaced fracture. No displacement or angulation. Severe degenerative changes of the bilateral hip joints. Pelvic bony ring intact. Advanced vascular calcifications. IMPRESSION: Findings suspicious for a nondisplaced fracture of the left femoral neck. Electronically Signed   By: Davina Poke D.O.   On: 03/07/2020 13:04    Review of Systems  Constitutional: Positive for activity change.  Respiratory: Negative.   Cardiovascular: Negative.   Gastrointestinal: Negative.   Genitourinary: Positive for frequency.  Musculoskeletal: Positive for gait problem.  Psychiatric/Behavioral: Negative.    Blood pressure (!) 181/83, pulse 94, temperature 98.8 F (37.1 C), temperature source Oral, resp. rate 18, SpO2 96 %. Physical Exam HENT:     Head: Normocephalic and atraumatic.  Eyes:     Extraocular Movements: Extraocular movements intact.     Pupils: Pupils are equal, round, and reactive to light.  Pulmonary:     Effort: No respiratory distress.  Abdominal:     General: There is no distension.  Musculoskeletal:        General: Tenderness present.     Comments: Positive logroll left hip  Neurological:     Mental Status: He is alert and oriented to person, place, and time.  Psychiatric:        Mood and Affect: Mood normal.     Assessment/Plan: Left hip fracture Left hip djd   I reviewed current issue with my attending dr Lorin Mercy.  He will decide if best treatment option is ORIF vs total hip replacement.  Advised patient and wife that with the chronic hip pain that he has had for several years and with findings of severe DJD with fracture on CT that THR may be the best option.  Patient will be admitted to the hospitalist service.  N.p.o. after midnight.  We will have [employer] put patient in Buck's traction.  We will plan on going to the OR tomorrow afternoon.  All questions  answered.  Benjiman Core 03/07/2020, 4:03 PM

## 2020-03-07 NOTE — Assessment & Plan Note (Signed)
-   palpable bladder on exam, enlarged prostate on CT, and patient reports difficulty with voiding at home - check bladder scan, if >350 cc , place indwelling foley - start flomax  - may need outpt urology follow up

## 2020-03-07 NOTE — Consult Note (Addendum)
Reason for Consult:left hip pain Referring Physician: ED  John Walton is an 86 y.o. male.  HPI: 86 year old white male was brought to the emergency room with complaints of left hip pain.  Wife present with him.  States that last night while there at home she heard her husband fall to the floor.  He was unable to ambulate due to complaints of left hip pain.  Son who lives at the home was able to help get him up off the floor.  Wife does state that he has had chronic left hip/groin pain over the last 20+ years after a previous injury. CT left hip in the ED showed:  IMPRESSION: 1. Acute nondisplaced fracture of the left femoral neck and intertrochanteric region. 2. 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 3. Enlarged prostate gland.  Past Medical History:  Diagnosis Date  . Chicken pox   . Frequent headaches   . Glaucoma   . Hay fever   . History of blood transfusion   . HTN (hypertension)   . Hyperlipidemia   . Hyperlipidemia   . Migraines   . 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[facility])     Past Surgical History:  Procedure Laterality Date  . CHOLECYSTECTOMY, LAPAROSCOPIC    . TONSILLECTOMY  1942    Family History  Problem Relation Age of Onset  . Liver disease Mother   . Skin cancer Father   . Skin cancer Paternal Grandfather   . Diabetes Maternal Grandfather     Social History:  reports that he quit smoking about 43 years ago. He started smoking about 51 years ago. He has never used smokeless tobacco. He reports that he does not drink alcohol and does not use drugs.  Allergies: No Known Allergies  Medications: I have reviewed the patient's current medications.  Results for orders placed or performed during the hospital encounter of 03/07/20 (from the past 48 hour(s))  CBC WITH DIFFERENTIAL     Status: Abnormal   Collection Time: 03/07/20 11:56 AM  Result Value Ref Range   WBC 12.3 (H) 4.0 - 10.5 K/uL   RBC 4.00 (L) 4.22 - 5.81 MIL/uL   Hemoglobin 11.5 (L) 13.0  - 17.0 g/dL   HCT 35.3 (L) 39 - 52 %   MCV 88.3 80.0 - 100.0 fL   MCH 28.8 26.0 - 34.0 pg   MCHC 32.6 30.0 - 36.0 g/dL   RDW 13.3 11.5 - 15.5 %   Platelets 229 150 - 400 K/uL   nRBC 0.0 0.0 - 0.2 %   Neutrophils Relative % 87 %   Neutro Abs 10.6 (H) 1.7 - 7.7 K/uL   Lymphocytes Relative 4 %   Lymphs Abs 0.5 (L) 0.7 - 4.0 K/uL   Monocytes Relative 9 %   Monocytes Absolute 1.1 (H) 0.1 - 1.0 K/uL   Eosinophils Relative 0 %   Eosinophils Absolute 0.0 0.0 - 0.5 K/uL   Basophils Relative 0 %   Basophils Absolute 0.0 0.0 - 0.1 K/uL   Immature Granulocytes 0 %   Abs Immature Granulocytes 0.05 0.00 - 0.07 K/uL    Comment: Performed at [facility], [place] [address], [place], [place] [address]  Protime-INR     Status: None   Collection Time: 03/07/20 11:56 AM  Result Value Ref Range   Prothrombin Time 13.8 11.4 - 15.2 seconds   INR 1.1 0.8 - 1.2    Comment: (NOTE) INR goal varies based on device and disease states. Performed at [facility]  [place] [address], [place], [place] [address]   Comprehensive metabolic panel     Status: Abnormal   Collection Time: 03/07/20 11:56 AM  Result Value Ref Range   Sodium 139 135 - 145 mmol/L   Potassium 4.6 3.5 - 5.1 mmol/L   Chloride 109 98 - 111 mmol/L   CO2 19 (L) 22 - 32 mmol/L   Glucose, Bld 164 (H) 70 - 99 mg/dL    Comment: Glucose reference range applies only to samples taken after fasting for at least 8 hours.   BUN 38 (H) 8 - 23 mg/dL   Creatinine, Ser 2.06 (H) 0.61 - 1.24 mg/dL   Calcium 8.6 (L) 8.9 - 10.3 mg/dL   Total Protein 7.8 6.5 - 8.1 g/dL   Albumin 4.2 3.5 - 5.0 g/dL   AST 23 15 - 41 U/L   ALT 15 0 - 44 U/L   Alkaline Phosphatase 92 38 - 126 U/L   Total Bilirubin 0.6 0.3 - 1.2 mg/dL   GFR, Estimated 31 (L) >60 mL/min    Comment: (NOTE) Calculated using the CKD-EPI Creatinine Equation (2021)    Anion gap 11 5 - 15    Comment: Performed at [facility], [place] [address], [place], [place] [address]  ABO/Rh     Status: None   Collection Time: 03/07/20 11:56 AM  Result Value Ref Range   ABO/RH(D)      A POS Performed at [facility], [place] [address] [place], [place] [address]   Resp Panel by RT-PCR (Flu A&B, Covid) Nasopharyngeal Swab     Status: None   Collection Time: 03/07/20 11:57 AM   Specimen: Nasopharyngeal Swab; Nasopharyngeal(NP) swabs in vial transport medium  Result Value Ref Range   SARS Coronavirus 2 by RT PCR NEGATIVE NEGATIVE    Comment: (NOTE) SARS-CoV-2 target nucleic acids are NOT DETECTED.  The SARS-CoV-2 RNA is generally detectable in upper respiratory specimens during the acute phase of infection. The lowest concentration of SARS-CoV-2 viral copies this assay can detect is 138 copies/mL. A negative result does not preclude SARS-Cov-2 infection and should not be used as the sole basis for treatment or other patient management decisions. A negative result may occur with  improper specimen collection/handling, submission of specimen other than nasopharyngeal swab, presence of viral mutation(s) within the areas targeted by this assay, and inadequate number of viral copies(<138 copies/mL). A negative result must be combined with clinical observations, patient history, and epidemiological information. The expected result is Negative.  Fact Sheet for Patients:  EntrepreneurPulse.com.au  Fact Sheet for Healthcare Providers:  IncredibleEmployment.be  This test is no t yet approved or cleared by the [place] FDA and  has been authorized for detection and/or diagnosis of SARS-CoV-2 by FDA under an Emergency Use Authorization (EUA). This EUA will remain  in effect (meaning this test can be used) for the duration of the COVID-19 declaration under Section 564(b)(1) of the Act, 21 U.S.C.section 360bbb-3(b)(1), unless the authorization is terminated  or revoked sooner.        Influenza A by PCR NEGATIVE NEGATIVE   Influenza B by PCR NEGATIVE NEGATIVE    Comment: (NOTE) The Xpert Xpress SARS-CoV-2/FLU/RSV plus assay is intended as an aid in the diagnosis of influenza from Nasopharyngeal swab specimens and should not be used as a sole basis for treatment. Nasal washings and aspirates are unacceptable for Xpert Xpress SARS-CoV-2/FLU/RSV testing.  Fact Sheet for Patients: EntrepreneurPulse.com.au  Fact Sheet for  Healthcare Providers: IncredibleEmployment.be  This test is not yet approved or cleared by the [place] and has been authorized for detection and/or diagnosis of SARS-CoV-2 by FDA under an Emergency Use Authorization (EUA). This EUA will remain in effect (meaning this test can be used) for the duration of the COVID-19 declaration under Section 564(b)(1) of the Act, 21 U.S.C. section 360bbb-3(b)(1), unless the authorization is terminated or revoked.  Performed at [facility], [place] [address], [place], [place] [address]   Type and screen [place]     Status: None   Collection Time: 03/07/20 12:19 PM  Result Value Ref Range   ABO/RH(D) A POS    Antibody Screen NEG    Sample Expiration      03/10/2020,2359 Performed at [facility], [place] [address], [place], [place] [address]   Urinalysis, Routine w reflex microscopic     Status: Abnormal   Collection Time: 03/07/20 12:50 PM  Result Value Ref Range   Color, Urine YELLOW YELLOW   APPearance CLEAR CLEAR   Specific Gravity, Urine 1.012 1.005 - 1.030   pH 5.0 5.0 - 8.0   Glucose, UA NEGATIVE NEGATIVE mg/dL   Hgb urine dipstick SMALL (A) NEGATIVE   Bilirubin Urine NEGATIVE NEGATIVE   Ketones, ur NEGATIVE NEGATIVE mg/dL   Protein, ur 100 (A) NEGATIVE mg/dL   Nitrite NEGATIVE NEGATIVE   Leukocytes,Ua NEGATIVE NEGATIVE   RBC / HPF 0-5 0 - 5 RBC/hpf   WBC, UA 0-5 0 - 5 WBC/hpf   Bacteria, UA  NONE SEEN NONE SEEN   Squamous Epithelial / LPF 0-5 0 - 5   Mucus PRESENT     Comment: Performed at [facility], [place] [address] [place],  [address]    DG Chest 1 View  Result Date: 03/07/2020 CLINICAL DATA:  Preop exam. EXAM: CHEST  1 VIEW COMPARISON:  Chest x-ray 4 06/20/1998. FINDINGS: Mediastinum and hilar structures normal. Heart size normal. Low lung volumes with mild left base subsegmental axis. No pleural effusion or pneumothorax. Degenerative change thoracic spine and both shoulders. IMPRESSION: Low lung volumes with mild left base subsegmental atelectasis. Electronically Signed   By: Marcello Moores  Register   On: 03/07/2020 13:07   DG Shoulder Right  Result Date: 03/07/2020 CLINICAL DATA:  Right shoulder pain after a fall last night. EXAM: RIGHT SHOULDER - 2+ VIEW COMPARISON:  None. FINDINGS: No acute fracture or dislocation is identified. Mild-to-moderate joint space narrowing and marginal spurring are noted at the acromioclavicular joint. Narrowing of the acromial humeral interval could indicate a rotator cuff tear. IMPRESSION: No acute osseous abnormality identified. Electronically Signed   By: Logan Bores M.D.   On: 03/07/2020 13:05   CT HEAD WO CONTRAST  Result Date: 03/07/2020 CLINICAL DATA:  Head trauma, minor (Age >= 65y) EXAM: CT HEAD WITHOUT CONTRAST TECHNIQUE: Contiguous axial images were obtained from the base of the skull through the vertex without intravenous contrast. COMPARISON:  04/23/2015 and prior FINDINGS: Brain: No acute infarct or intracranial hemorrhage. No mass lesion. No midline shift, ventriculomegaly or extra-axial fluid collection. Mild cerebral atrophy with ex vacuo dilatation. Chronic microvascular ischemic changes. Remote right frontal insult. Vascular: No hyperdense vessel or unexpected calcification. Bilateral carotid siphon atherosclerotic calcifications. Skull: Negative for fracture or focal lesion. Sinuses/Orbits: No acute orbital  finding. Pneumatized paranasal sinuses and mastoid air cells. Other: Right frontal scalp hematoma. IMPRESSION: 1. No acute intracranial process.  Remote right frontal insult. 2. Mild cerebral atrophy  and chronic microvascular ischemic changes. 3. Right frontal scalp hematoma. Electronically Signed   By: Primitivo Gauze M.D.   On: 03/07/2020 14:49   CT HIP LEFT WO CONTRAST  Result Date: 03/07/2020 CLINICAL DATA:  Left hip pain after fall.  Abnormal x-ray. EXAM: CT OF THE LEFT HIP WITHOUT CONTRAST TECHNIQUE: Multidetector CT imaging of the left hip was performed according to the standard protocol. Multiplanar CT image reconstructions were also generated. COMPARISON:  X-ray 03/07/2020 FINDINGS: Bones/Joint/Cartilage Acute nondisplaced fracture of the left femoral neck and intertrochanteric region. No significant impaction or angulation. No fracture involvement of the femoral head. Severe osteoarthritis of the left hip joint with complete joint space loss, subchondral sclerosis/cystic change, and marginal osteophyte formation. Visualized portion of the left hemipelvis is intact without fracture. The inferior aspect of the left SI joint is intact without diastasis. Ligaments Suboptimally assessed by CT. Muscles and Tendons No acute musculotendinous abnormality by CT. Soft tissues There is induration within the subcutaneous soft tissues overlying the greater trochanter without organized soft tissue collection or hematoma. Visualized prostate gland is enlarged. Scattered vascular calcifications. IMPRESSION: 1. Acute nondisplaced fracture of the left femoral neck and intertrochanteric region. 2. 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 3. Enlarged prostate gland. Electronically Signed   By: Davina Poke D.O.   On: 03/07/2020 15:31   DG Hip Unilat With Pelvis 2-3 Views Left  Result Date: 03/07/2020 CLINICAL DATA:  Left hip pain after fall EXAM: DG HIP (WITH OR WITHOUT PELVIS) 2-3V LEFT COMPARISON:  None.  FINDINGS: Cortical irregularity along the lateral aspect of the left femoral neck suspicious for a nondisplaced fracture. No displacement or angulation. Severe degenerative changes of the bilateral hip joints. Pelvic bony ring intact. Advanced vascular calcifications. IMPRESSION: Findings suspicious for a nondisplaced fracture of the left femoral neck. Electronically Signed   By: Davina Poke D.O.   On: 03/07/2020 13:04    Review of Systems  Constitutional: Positive for activity change.  Respiratory: Negative.   Cardiovascular: Negative.   Gastrointestinal: Negative.   Genitourinary: Positive for frequency.  Musculoskeletal: Positive for gait problem.  Psychiatric/Behavioral: Negative.    Blood pressure (!) 181/83, pulse 94, temperature 98.8 F (37.1 C), temperature source Oral, resp. rate 18, SpO2 96 %. Physical Exam HENT:     Head: Normocephalic and atraumatic.  Eyes:     Extraocular Movements: Extraocular movements intact.     Pupils: Pupils are equal, round, and reactive to light.  Pulmonary:     Effort: No respiratory distress.  Abdominal:     General: There is no distension.  Musculoskeletal:        General: Tenderness present.     Comments: Positive logroll left hip  Neurological:     Mental Status: He is alert and oriented to person, place, and time.  Psychiatric:        Mood and Affect: Mood normal.     Assessment/Plan: Left hip fracture Left hip djd   I reviewed current issue with my attending dr Lorin Mercy.  He will decide if best treatment option is ORIF vs total hip replacement.  Advised patient and wife that with the chronic hip pain that he has had for several years and with findings of severe DJD with fracture on CT that THR may be the best option.  Patient will be admitted to the hospitalist service.  N.p.o. after midnight.  We will have [employer] put patient in Buck's traction.  We will plan on going to the OR tomorrow afternoon.  All questions  answered.  Benjiman Core 03/07/2020, 4:03 PM

## 2020-03-07 NOTE — Assessment & Plan Note (Addendum)
-   BP uncontrolled; history of HTN but no meds  - start on amlodipine; will add further agents as needed - PRN labetalol and hydralazine - obtain echo given murmur and history of uncontrolled BP

## 2020-03-07 NOTE — Assessment & Plan Note (Addendum)
-   s/p mechanical fall - CT hip shows acute nondisplaced fracture of left femoral neck and intertrochanteric region; severe OA left hip joint - seen by ortho; plans are for repair inpatient - for now continue pain control  - HSQ for DVT ppx - until medical workup complete, I would not be able to risk stratify for surgery; he appears to be rather uncontrolled medically and in need for further stabilizing and workup (uncontrolled BP in ER 212/103, palpable abdominal mass, palpable distended bladder, heart murmur with hx RBBB and rec'd for stress test at that time)

## 2020-03-07 NOTE — Assessment & Plan Note (Signed)
-   excessively elevated BP while patient at rest in bed in no obvious pain or discomfort; wife confirms elevated BP outpatient at times as well - patient not on meds - no neuro deficits at this time - unsure how much of his renal function is acute or chronic at this point without recent records but renal function has ben declined in past - check troponin; EKG negative for obvious ischemia - check echo - PRN labetalol and hydralazine ordered - starting scheduled amlodipine - goal 20% reduction for now (goal SBP 160-170) then further decrease tomorrow

## 2020-03-07 NOTE — Assessment & Plan Note (Addendum)
-   saw Dr. Debara Pickett in 08/2014 and was offered a stress test due to new RBBB; patient declined and was to return in 3-4 months for follow up but did not do so - repeat EKG now: RBBB still present. No ischemia noted. No LVH - check TSH, A1c, Lipid - obtain echo

## 2020-03-07 NOTE — Progress Notes (Signed)
Pt's wife states she would like staff to know that patient has blood in his left eye. She states that they are suppose to receive a phone call on Thursday to schedule left eye surgery. She states that her husband is on eye drops for his eyes and that she told the pharmacy tech these medications.  Lucius Conn BSN, RN-BC Admissions RN 03/07/2020 4:50 PM

## 2020-03-07 NOTE — ED Triage Notes (Signed)
Pt BIB EMS. Pt had a fall last night when walking back to the bed. Pt hit head; hematoma to right forehead. No blood thinners. Pt c/o of left groin pain 10/10.   BP-168/96 Temp-101.7 HR-105 CBG-171 O2-98% RA RR-20

## 2020-03-07 NOTE — Hospital Course (Signed)
John Walton is an 86 yo male with PMH HTN, HLD, glaucoma, RBBB, DMII, CKD4 with questionable compliance to meds who presented after a mechanical fall at home.  Patient is accompanied by his wife in the ER who helps provide further collateral information.  He was going up the stairs and states that he missed a step and fell onto the floor.  This happened around 10 PM on Monday evening.  It took several hours for his wife and another family member to get him in the chair and back to bed.  Due to his ongoing left hip pain he finally presented for further evaluation.  He follows outpatient with a PCP per his wife, John Walton.  He also follows with the [place], John Walton. No records are available for review.  Only past records in epic are from 2016.  He had a right bundle branch block at that time and was evaluated by cardiology with recommendations for undergoing a stress test at that time, however he declined and has not presented back since.  He also endorses that he does not take any routine medications at home.  He has lived a natural lifestyle and only uses a herbal substance to help with the neuropathic pains he is describing in his feet. He states that he controlled his diabetes with diet and exercise.  We talked about his uncontrolled blood pressure in the ER; he does endorse some pain however his wife states that his blood pressure has been elevated at office visits as well.  He does not take any medications for his blood pressure. During exam he was noted to have a rather large and noticeable abdominal mass.  It was not pulsating but when asked what it was, the patient could not elaborate and it appears has not been evaluated or at least they cannot tell me so. We also discussed his renal function.  They are aware that it is decreased but also could not further elaborate.  He does endorse that he has difficulty voiding oftentimes and can take up to 30 minutes to initiate voiding.  On work-up in the ER  regarding his fall he underwent imaging.  His wife does note that he had a small cyst on his right forehead that they knew about but after his fall and hitting his head he did have an enlarged amount of swelling on his right forehead. CT head was negative for intracranial process but did show a right frontal scalp hematoma.  He has a remote right frontal infarct noted as well.  Mild cerebral atrophy.  Right shoulder x-ray negative for acute abnormality.  CXR unremarkable, some atelectasis. CT left hip shows acute nondisplaced fracture of left femoral neck and intertrochanteric region.  Severe osteoarthritis of left hip joint. Also showed enlarged prostate gland.  He was evaluated by orthopedic surgery with tentative plans for operative repair and is admitted for further work-up and treatment.

## 2020-03-07 NOTE — Assessment & Plan Note (Signed)
-   not pulsatile but u/k etiology at this time; patient nor wife are aware of significance or timeline of chronicity - obtain abdominal ultrasound to further evaluate

## 2020-03-07 NOTE — Assessment & Plan Note (Signed)
-   likely anemia of chronic disease; baseline Hgb 11-12 looking at trend - currently at baseline - monitor Hgb after IVF

## 2020-03-07 NOTE — H&P (Addendum)
History and Physical    John Walton  UJW:[id number]  DOB: 1932-11-18  DOA: 03/07/2020  PCP: [name], MD Patient coming from: Home  Chief Complaint: Fall  HPI:  John Walton is an 86 yo male with PMH HTN, HLD, glaucoma, RBBB, DMII, CKD4 with questionable compliance to meds who presented after a mechanical fall at home.  Patient is accompanied by his wife in the ER who helps provide further collateral information.  He was going up the stairs and states that he missed a step and fell onto the floor.  This happened around 10 PM on Monday evening.  It took several hours for his wife and another family member to get him in the chair and back to bed.  Due to his ongoing left hip pain he finally presented for further evaluation.  He follows outpatient with a PCP per his wife, [name].  He also follows with the [place], [name]. No records are available for review.  Only past records in epic are from 2016.  He had a right bundle branch block at that time and was evaluated by cardiology with recommendations for undergoing a stress test at that time, however he declined and has not presented back since.  He also endorses that he does not take any routine medications at home.  He has lived a natural lifestyle and only uses a herbal substance to help with the neuropathic pains he is describing in his feet. He states that he controlled his diabetes with diet and exercise.  We talked about his uncontrolled blood pressure in the ER; he does endorse some pain however his wife states that his blood pressure has been elevated at office visits as well.  He does not take any medications for his blood pressure. During exam he was noted to have a rather large and noticeable abdominal mass.  It was not pulsating but when asked what it was, the patient could not elaborate and it appears has not been evaluated or at least they cannot tell me so. We also discussed his renal function.  They are aware that  it is decreased but also could not further elaborate.  He does endorse that he has difficulty voiding oftentimes and can take up to 30 minutes to initiate voiding.  On work-up in the ER regarding his fall he underwent imaging.  His wife does note that he had a small cyst on his right forehead that they knew about but after his fall and hitting his head he did have an enlarged amount of swelling on his right forehead. CT head was negative for intracranial process but did show a right frontal scalp hematoma.  He has a remote right frontal infarct noted as well.  Mild cerebral atrophy.  Right shoulder x-ray negative for acute abnormality.  CXR unremarkable, some atelectasis. CT left hip shows acute nondisplaced fracture of left femoral neck and intertrochanteric region.  Severe osteoarthritis of left hip joint. Also showed enlarged prostate gland.  He was evaluated by orthopedic surgery with tentative plans for operative repair and is admitted for further work-up and treatment.   I have personally briefly reviewed patient's old medical records in [facility] and discussed patient with the ER provider when appropriate/indicated.  Assessment/Plan: * Femoral neck fracture (HCC) - s/p mechanical fall - CT hip shows acute nondisplaced fracture of left femoral neck and intertrochanteric region; severe OA left hip joint - seen by ortho; plans are for repair inpatient - for now continue  pain control  - HSQ for DVT ppx - until medical workup complete, I would not be able to risk stratify for surgery; he appears to be rather uncontrolled medically and in need for further stabilizing and workup (uncontrolled BP in ER 212/103, palpable abdominal mass, palpable distended bladder, heart murmur with hx RBBB and rec'd for stress test at that time)  Hypertensive urgency - excessively elevated BP while patient at rest in bed in no obvious pain or discomfort; wife confirms elevated BP outpatient at times as  well - patient not on meds - no neuro deficits at this time - unsure how much of his renal function is acute or chronic at this point without recent records but renal function has ben declined in past - check troponin; EKG negative for obvious ischemia - check echo - PRN labetalol and hydralazine ordered - starting scheduled amlodipine - goal 20% reduction for now (goal SBP 160-170) then further decrease tomorrow  Abdominal mass - not pulsatile but u/k etiology at this time; patient nor wife are aware of significance or timeline of chronicity - obtain abdominal ultrasound to further evaluate  BPH (benign prostatic hyperplasia) - palpable bladder on exam, enlarged prostate on CT, and patient reports difficulty with voiding at home - check bladder scan, if >350 cc , place indwelling foley - start flomax  - may need outpt urology follow up   RBBB - saw Dr. Debara Pickett in 08/2014 and was offered a stress test due to new RBBB; patient declined and was to return in 3-4 months for follow up but did not do so - repeat EKG now: RBBB still present. No ischemia noted. No LVH - check TSH, A1c, Lipid - obtain echo   Essential hypertension - BP uncontrolled; history of HTN but no meds  - start on amlodipine; will add further agents as needed - PRN labetalol and hydralazine - obtain echo given murmur and history of uncontrolled BP  CKD (chronic kidney disease), stage IV (HCC) - creat in 12/2014 was 2.28 with GFR 25; currently creat is 2.06. Likely has stage IV due to untreated and uncontrolled HTN and/or DM - follow up creat response to IVF - check renal u/s  Scalp hematoma - no intracranial process on CTH - no laceration or bleeding - continue supportive care  Normocytic anemia - likely anemia of chronic disease; baseline Hgb 11-12 looking at trend - currently at baseline - monitor Hgb after IVF  Hyperlipidemia - check lipid panel    Code Status: Full DVT Prophylaxis: HSQ Anticipated  disposition is to: pending clinical course and PT eval after surgery  History: Past Medical History:  Diagnosis Date  . Chicken pox   . Frequent headaches   . Glaucoma   . Hay fever   . History of blood transfusion   . HTN (hypertension)   . Hyperlipidemia   . Hyperlipidemia   . Migraines   . 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[facility])     Past Surgical History:  Procedure Laterality Date  . CHOLECYSTECTOMY, LAPAROSCOPIC    . TONSILLECTOMY  1942     reports that he quit smoking about 43 years ago. He started smoking about 51 years ago. He has never used smokeless tobacco. He reports that he does not drink alcohol and does not use drugs.  No Known Allergies  Family History  Problem Relation Age of Onset  . Liver disease Mother   . Skin cancer Father   . Skin cancer Paternal Grandfather   . Diabetes  Maternal Grandfather    Home Medications: Prior to Admission medications   Medication Sig Start Date End Date Taking? Authorizing Provider  Aromatic Inhalants (VICKS BABYRUB) OINT Apply 1 application topically as needed (skin irritations). Adult strength   Yes [provider]  carboxymethylcellulose (REFRESH PLUS) 0.5 % SOLN Place 1 drop into both eyes 3 (three) times daily as needed (dry eyes).   Yes [provider]  latanoprost (XALATAN) 0.005 % ophthalmic solution Place 1 drop into both eyes at bedtime.  03/13/19  Yes [provider]  Polyethyl Glycol-Propyl Glycol (SYSTANE) 0.4-0.3 % GEL ophthalmic gel Place 1 application into both eyes in the morning and at bedtime.   Yes [provider]  SIMBRINZA 1-0.2 % SUSP Place 1 drop into both eyes in the morning and at bedtime.  05/16/19  Yes [provider]    Review of Systems:  Pertinent items noted in HPI and remainder of comprehensive ROS otherwise negative.  Physical Exam: Vitals:   03/07/20 1600 03/07/20 1700 03/07/20 1723 03/07/20 1732  BP: (!) 177/85 (!) 219/105 (!) 212/103 (!)  181/83  Pulse: 94 (!) 109  99  Resp: 18 18    Temp:      TempSrc:      SpO2: 95% 96%  93%   General appearance: elderly man, slightly unkempt, laying in bed calm, comfortable, and no distress Head: right forehead hematoma noted (no laceration/bleeding) just above hematoma cyst is noted Eyes: EOMI, PERRL Lungs: clear to auscultation bilaterally Heart: RRR, S1S1 present, 3/6 HSM noted best in RUSB but heard throughout precordium Abdomen: palpable mass measuring approx 4cm in length by 3cm width (could not appreciate pulsation); palpable and distended bladder; bowel sounds hypoactive; no tenderness on exam Extremities: trace edema in LE Skin: warm, dry, intact Neurologic: Grossly normal  Labs on Admission:  I have personally reviewed following labs and imaging studies Results for orders placed or performed during the hospital encounter of 03/07/20 (from the past 24 hour(s))  CBC WITH DIFFERENTIAL     Status: Abnormal   Collection Time: 03/07/20 11:56 AM  Result Value Ref Range   WBC 12.3 (H) 4.0 - 10.5 K/uL   RBC 4.00 (L) 4.22 - 5.81 MIL/uL   Hemoglobin 11.5 (L) 13.0 - 17.0 g/dL   HCT 35.3 (L) 39 - 52 %   MCV 88.3 80.0 - 100.0 fL   MCH 28.8 26.0 - 34.0 pg   MCHC 32.6 30.0 - 36.0 g/dL   RDW 13.3 11.5 - 15.5 %   Platelets 229 150 - 400 K/uL   nRBC 0.0 0.0 - 0.2 %   Neutrophils Relative % 87 %   Neutro Abs 10.6 (H) 1.7 - 7.7 K/uL   Lymphocytes Relative 4 %   Lymphs Abs 0.5 (L) 0.7 - 4.0 K/uL   Monocytes Relative 9 %   Monocytes Absolute 1.1 (H) 0.1 - 1.0 K/uL   Eosinophils Relative 0 %   Eosinophils Absolute 0.0 0.0 - 0.5 K/uL   Basophils Relative 0 %   Basophils Absolute 0.0 0.0 - 0.1 K/uL   Immature Granulocytes 0 %   Abs Immature Granulocytes 0.05 0.00 - 0.07 K/uL  Protime-INR     Status: None   Collection Time: 03/07/20 11:56 AM  Result Value Ref Range   Prothrombin Time 13.8 11.4 - 15.2 seconds   INR 1.1 0.8 - 1.2  Comprehensive metabolic panel     Status: Abnormal    Collection Time: 03/07/20 11:56 AM  Result Value Ref Range  Sodium 139 135 - 145 mmol/L   Potassium 4.6 3.5 - 5.1 mmol/L   Chloride 109 98 - 111 mmol/L   CO2 19 (L) 22 - 32 mmol/L   Glucose, Bld 164 (H) 70 - 99 mg/dL   BUN 38 (H) 8 - 23 mg/dL   Creatinine, Ser 2.06 (H) 0.61 - 1.24 mg/dL   Calcium 8.6 (L) 8.9 - 10.3 mg/dL   Total Protein 7.8 6.5 - 8.1 g/dL   Albumin 4.2 3.5 - 5.0 g/dL   AST 23 15 - 41 U/L   ALT 15 0 - 44 U/L   Alkaline Phosphatase 92 38 - 126 U/L   Total Bilirubin 0.6 0.3 - 1.2 mg/dL   GFR, Estimated 31 (L) >60 mL/min   Anion gap 11 5 - 15  ABO/Rh     Status: None   Collection Time: 03/07/20 11:56 AM  Result Value Ref Range   ABO/RH(D)      A POS Performed at [facility], [place] [address] [place], [place] [address]   TSH     Status: Abnormal   Collection Time: 03/07/20 11:56 AM  Result Value Ref Range   TSH 0.278 (L) 0.350 - 4.500 uIU/mL  Lipid panel     Status: Abnormal   Collection Time: 03/07/20 11:56 AM  Result Value Ref Range   Cholesterol 223 (H) 0 - 200 mg/dL   Triglycerides 114 <150 mg/dL   HDL 45 >40 mg/dL   Total CHOL/HDL Ratio 5.0 RATIO   VLDL 23 0 - 40 mg/dL   LDL Cholesterol 155 (H) 0 - 99 mg/dL  Resp Panel by RT-PCR (Flu A&B, Covid) Nasopharyngeal Swab     Status: None   Collection Time: 03/07/20 11:57 AM   Specimen: Nasopharyngeal Swab; Nasopharyngeal(NP) swabs in vial transport medium  Result Value Ref Range   SARS Coronavirus 2 by RT PCR NEGATIVE NEGATIVE   Influenza A by PCR NEGATIVE NEGATIVE   Influenza B by PCR NEGATIVE NEGATIVE  Type and screen [place]     Status: None   Collection Time: 03/07/20 12:19 PM  Result Value Ref Range   ABO/RH(D) A POS    Antibody Screen NEG    Sample Expiration      03/10/2020,2359 Performed at [facility], [place] [address], [place], [place] [address]   Urinalysis, Routine w reflex microscopic     Status: Abnormal   Collection Time:  03/07/20 12:50 PM  Result Value Ref Range   Color, Urine YELLOW YELLOW   APPearance CLEAR CLEAR   Specific Gravity, Urine 1.012 1.005 - 1.030   pH 5.0 5.0 - 8.0   Glucose, UA NEGATIVE NEGATIVE mg/dL   Hgb urine dipstick SMALL (A) NEGATIVE   Bilirubin Urine NEGATIVE NEGATIVE   Ketones, ur NEGATIVE NEGATIVE mg/dL   Protein, ur 100 (A) NEGATIVE mg/dL   Nitrite NEGATIVE NEGATIVE   Leukocytes,Ua NEGATIVE NEGATIVE   RBC / HPF 0-5 0 - 5 RBC/hpf   WBC, UA 0-5 0 - 5 WBC/hpf   Bacteria, UA NONE SEEN NONE SEEN   Squamous Epithelial / LPF 0-5 0 - 5   Mucus PRESENT      Radiological Exams on Admission: DG Chest 1 View  Result Date: 03/07/2020 CLINICAL DATA:  Preop exam. EXAM: CHEST  1 VIEW COMPARISON:  Chest x-ray 4 06/20/1998. FINDINGS: Mediastinum and hilar structures normal. Heart size normal. Low lung volumes with mild left base subsegmental axis. No pleural effusion or pneumothorax. Degenerative change  thoracic spine and both shoulders. IMPRESSION: Low lung volumes with mild left base subsegmental atelectasis. Electronically Signed   By: Marcello Moores  Register   On: 03/07/2020 13:07   DG Shoulder Right  Result Date: 03/07/2020 CLINICAL DATA:  Right shoulder pain after a fall last night. EXAM: RIGHT SHOULDER - 2+ VIEW COMPARISON:  None. FINDINGS: No acute fracture or dislocation is identified. Mild-to-moderate joint space narrowing and marginal spurring are noted at the acromioclavicular joint. Narrowing of the acromial humeral interval could indicate a rotator cuff tear. IMPRESSION: No acute osseous abnormality identified. Electronically Signed   By: Logan Bores M.D.   On: 03/07/2020 13:05   CT HEAD WO CONTRAST  Result Date: 03/07/2020 CLINICAL DATA:  Head trauma, minor (Age >= 65y) EXAM: CT HEAD WITHOUT CONTRAST TECHNIQUE: Contiguous axial images were obtained from the base of the skull through the vertex without intravenous contrast. COMPARISON:  04/23/2015 and prior FINDINGS: Brain: No acute  infarct or intracranial hemorrhage. No mass lesion. No midline shift, ventriculomegaly or extra-axial fluid collection. Mild cerebral atrophy with ex vacuo dilatation. Chronic microvascular ischemic changes. Remote right frontal insult. Vascular: No hyperdense vessel or unexpected calcification. Bilateral carotid siphon atherosclerotic calcifications. Skull: Negative for fracture or focal lesion. Sinuses/Orbits: No acute orbital finding. Pneumatized paranasal sinuses and mastoid air cells. Other: Right frontal scalp hematoma. IMPRESSION: 1. No acute intracranial process.  Remote right frontal insult. 2. Mild cerebral atrophy and chronic microvascular ischemic changes. 3. Right frontal scalp hematoma. Electronically Signed   By: Primitivo Gauze M.D.   On: 03/07/2020 14:49   CT HIP LEFT WO CONTRAST  Result Date: 03/07/2020 CLINICAL DATA:  Left hip pain after fall.  Abnormal x-ray. EXAM: CT OF THE LEFT HIP WITHOUT CONTRAST TECHNIQUE: Multidetector CT imaging of the left hip was performed according to the standard protocol. Multiplanar CT image reconstructions were also generated. COMPARISON:  X-ray 03/07/2020 FINDINGS: Bones/Joint/Cartilage Acute nondisplaced fracture of the left femoral neck and intertrochanteric region. No significant impaction or angulation. No fracture involvement of the femoral head. Severe osteoarthritis of the left hip joint with complete joint space loss, subchondral sclerosis/cystic change, and marginal osteophyte formation. Visualized portion of the left hemipelvis is intact without fracture. The inferior aspect of the left SI joint is intact without diastasis. Ligaments Suboptimally assessed by CT. Muscles and Tendons No acute musculotendinous abnormality by CT. Soft tissues There is induration within the subcutaneous soft tissues overlying the greater trochanter without organized soft tissue collection or hematoma. Visualized prostate gland is enlarged. Scattered vascular  calcifications. IMPRESSION: 1. Acute nondisplaced fracture of the left femoral neck and intertrochanteric region. 2. 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 3. Enlarged prostate gland. Electronically Signed   By: Davina Poke D.O.   On: 03/07/2020 15:31   DG Hip Unilat With Pelvis 2-3 Views Left  Result Date: 03/07/2020 CLINICAL DATA:  Left hip pain after fall EXAM: DG HIP (WITH OR WITHOUT PELVIS) 2-3V LEFT COMPARISON:  None. FINDINGS: Cortical irregularity along the lateral aspect of the left femoral neck suspicious for a nondisplaced fracture. No displacement or angulation. Severe degenerative changes of the bilateral hip joints. Pelvic bony ring intact. Advanced vascular calcifications. IMPRESSION: Findings suspicious for a nondisplaced fracture of the left femoral neck. Electronically Signed   By: Davina Poke D.O.   On: 03/07/2020 13:04   CT HIP LEFT WO CONTRAST  Final Result    CT HEAD WO CONTRAST  Final Result    DG Chest 1 View  Final Result  DG Hip Unilat With Pelvis 2-3 Views Left  Final Result    DG Shoulder Right  Final Result    US Abdomen Complete    (Results Pending)    Consults called:  Ortho surgery   EKG: Independently reviewed. RBBB, sinus tach, QTc 494   Dwyane Dee, MD [facility] 03/07/2020, 5:55 PM

## 2020-03-07 NOTE — Assessment & Plan Note (Signed)
-   no intracranial process on CTH - no laceration or bleeding - continue supportive care

## 2020-03-07 NOTE — Assessment & Plan Note (Signed)
-   check lipid panel  

## 2020-03-07 NOTE — Progress Notes (Signed)
Orthopedic Tech Progress Note Patient Details:  John Walton 09/29/1932 [id number]  Musculoskeletal Traction Type of Traction: Bucks Skin Traction Traction Location: LLE Traction Weight: 15 lbs   Post Interventions Patient Tolerated: Well Instructions Provided: Care of device   Braulio Bosch 03/07/2020, 8:10 PM

## 2020-03-08 ENCOUNTER — Inpatient Hospital Stay (HOSPITAL_COMMUNITY): Payer: Medicare Other

## 2020-03-08 ENCOUNTER — Inpatient Hospital Stay (HOSPITAL_COMMUNITY): Payer: Medicare Other | Admitting: Certified Registered Nurse Anesthetist

## 2020-03-08 ENCOUNTER — Encounter (HOSPITAL_COMMUNITY)
Admission: EM | Disposition: A | Discharge status: Skilled Nursing Facility | Payer: Self-pay | Source: Home / Self Care | Attending: Internal Medicine

## 2020-03-08 ENCOUNTER — Encounter (HOSPITAL_COMMUNITY): Payer: Self-pay | Admitting: Internal Medicine

## 2020-03-08 ENCOUNTER — Inpatient Hospital Stay: Admit: 2020-03-08 | Payer: Medicare Other | Admitting: Orthopaedic Surgery

## 2020-03-08 DIAGNOSIS — M1612 Unilateral primary osteoarthritis, left hip: Secondary | ICD-10-CM

## 2020-03-08 DIAGNOSIS — N138 Other obstructive and reflux uropathy: Secondary | ICD-10-CM

## 2020-03-08 DIAGNOSIS — N1330 Unspecified hydronephrosis: Secondary | ICD-10-CM

## 2020-03-08 DIAGNOSIS — I160 Hypertensive urgency: Secondary | ICD-10-CM

## 2020-03-08 DIAGNOSIS — S72002A Fracture of unspecified part of neck of left femur, initial encounter for closed fracture: Principal | ICD-10-CM

## 2020-03-08 DIAGNOSIS — E7800 Pure hypercholesterolemia, unspecified: Secondary | ICD-10-CM

## 2020-03-08 DIAGNOSIS — N179 Acute kidney failure, unspecified: Secondary | ICD-10-CM

## 2020-03-08 DIAGNOSIS — S0003XA Contusion of scalp, initial encounter: Secondary | ICD-10-CM

## 2020-03-08 DIAGNOSIS — R011 Cardiac murmur, unspecified: Secondary | ICD-10-CM

## 2020-03-08 HISTORY — PX: TOTAL HIP ARTHROPLASTY: SHX124

## 2020-03-08 LAB — GLUCOSE, CAPILLARY
Glucose-Capillary: 117 mg/dL — ABNORMAL HIGH (ref 70–99)
Glucose-Capillary: 170 mg/dL — ABNORMAL HIGH (ref 70–99)

## 2020-03-08 LAB — SURGICAL PCR SCREEN
MRSA, PCR: NEGATIVE
Staphylococcus aureus: NEGATIVE

## 2020-03-08 LAB — MAGNESIUM: Magnesium: 2.1 mg/dL (ref 1.7–2.4)

## 2020-03-08 LAB — ECHOCARDIOGRAM COMPLETE
AR max vel: 1.82 cm2
AV Area VTI: 1.54 cm2
AV Area mean vel: 1.66 cm2
AV Mean grad: 12 mmHg
AV Peak grad: 19.2 mmHg
Ao pk vel: 2.19 m/s
Area-P 1/2: 3.39 cm2
Calc EF: 54.7 %
Height: 66 in
S' Lateral: 3.4 cm
Single Plane A2C EF: 56.4 %
Single Plane A4C EF: 53.1 %
Weight: 2215.18 oz

## 2020-03-08 LAB — CBC WITH DIFFERENTIAL/PLATELET
Abs Immature Granulocytes: 0.05 10*3/uL (ref 0.00–0.07)
Basophils Absolute: 0 10*3/uL (ref 0.0–0.1)
Basophils Relative: 0 %
Eosinophils Absolute: 0.1 10*3/uL (ref 0.0–0.5)
Eosinophils Relative: 1 %
HCT: 29.1 % — ABNORMAL LOW (ref 39.0–52.0)
Hemoglobin: 9.5 g/dL — ABNORMAL LOW (ref 13.0–17.0)
Immature Granulocytes: 1 %
Lymphocytes Relative: 8 %
Lymphs Abs: 0.5 10*3/uL — ABNORMAL LOW (ref 0.7–4.0)
MCH: 28.9 pg (ref 26.0–34.0)
MCHC: 32.6 g/dL (ref 30.0–36.0)
MCV: 88.4 fL (ref 80.0–100.0)
Monocytes Absolute: 0.9 10*3/uL (ref 0.1–1.0)
Monocytes Relative: 13 %
Neutro Abs: 5.7 10*3/uL (ref 1.7–7.7)
Neutrophils Relative %: 77 %
Platelets: 177 10*3/uL (ref 150–400)
RBC: 3.29 MIL/uL — ABNORMAL LOW (ref 4.22–5.81)
RDW: 13.4 % (ref 11.5–15.5)
WBC: 7.2 10*3/uL (ref 4.0–10.5)
nRBC: 0 % (ref 0.0–0.2)

## 2020-03-08 LAB — BASIC METABOLIC PANEL
Anion gap: 11 (ref 5–15)
BUN: 43 mg/dL — ABNORMAL HIGH (ref 8–23)
CO2: 18 mmol/L — ABNORMAL LOW (ref 22–32)
Calcium: 7.8 mg/dL — ABNORMAL LOW (ref 8.9–10.3)
Chloride: 110 mmol/L (ref 98–111)
Creatinine, Ser: 2.12 mg/dL — ABNORMAL HIGH (ref 0.61–1.24)
GFR, Estimated: 30 mL/min — ABNORMAL LOW (ref >60)
Glucose, Bld: 151 mg/dL — ABNORMAL HIGH (ref 70–99)
Potassium: 4.1 mmol/L (ref 3.5–5.1)
Sodium: 139 mmol/L (ref 135–145)

## 2020-03-08 LAB — PHOSPHORUS: Phosphorus: 4.4 mg/dL (ref 2.5–4.6)

## 2020-03-08 SURGERY — ARTHROPLASTY, HIP, TOTAL,POSTERIOR APPROACH
Anesthesia: General | Site: Hip | Laterality: Left

## 2020-03-08 MED ORDER — METOCLOPRAMIDE HCL 5 MG/ML IJ SOLN: 5.0000 mg | Freq: Three times a day (TID) | INTRAMUSCULAR | Status: DC | PRN

## 2020-03-08 MED ORDER — ONDANSETRON HCL 4 MG/2ML IJ SOLN: 4.0000 mg | Freq: Once | INTRAMUSCULAR | Status: DC | PRN

## 2020-03-08 MED ORDER — CHLORHEXIDINE GLUCONATE 4 % EX LIQD: 60.0000 mL | Freq: Once | CUTANEOUS | Status: AC

## 2020-03-08 MED ORDER — ACETAMINOPHEN 10 MG/ML IV SOLN: 1000.0000 mg | Freq: Once | INTRAVENOUS | Status: DC | PRN

## 2020-03-08 MED ORDER — ALBUMIN HUMAN 5 % IV SOLN: INTRAVENOUS | Status: DC | PRN

## 2020-03-08 MED ORDER — ONDANSETRON HCL 4 MG/2ML IJ SOLN
INTRAMUSCULAR | Status: AC
  Filled 2020-03-08: qty 2

## 2020-03-08 MED ORDER — PROPOFOL 10 MG/ML IV BOLUS
INTRAVENOUS | Status: AC
  Filled 2020-03-08: qty 20

## 2020-03-08 MED ORDER — PHENYLEPHRINE HCL-NACL 10-0.9 MG/250ML-% IV SOLN
INTRAVENOUS | Status: DC | PRN
  Administered 2020-03-08: 40 ug/min via INTRAVENOUS

## 2020-03-08 MED ORDER — DOCUSATE SODIUM 100 MG PO CAPS
100.0000 mg | ORAL_CAPSULE | Freq: Two times a day (BID) | ORAL | Status: DC
  Administered 2020-03-09 – 2020-03-13 (×9): 100 mg via ORAL
  Filled 2020-03-08 (×9): qty 1

## 2020-03-08 MED ORDER — ROCURONIUM BROMIDE 100 MG/10ML IV SOLN
INTRAVENOUS | Status: DC | PRN
  Administered 2020-03-08: 70 mg via INTRAVENOUS

## 2020-03-08 MED ORDER — CEFAZOLIN SODIUM-DEXTROSE 2-4 GM/100ML-% IV SOLN
2.0000 g | INTRAVENOUS | Status: AC
  Administered 2020-03-08: 2 g via INTRAVENOUS
  Filled 2020-03-08: qty 100

## 2020-03-08 MED ORDER — PHENYLEPHRINE HCL (PRESSORS) 10 MG/ML IV SOLN
INTRAVENOUS | Status: AC
  Filled 2020-03-08: qty 1

## 2020-03-08 MED ORDER — DEXAMETHASONE SODIUM PHOSPHATE 10 MG/ML IJ SOLN
INTRAMUSCULAR | Status: AC
  Filled 2020-03-08: qty 1

## 2020-03-08 MED ORDER — ROCURONIUM BROMIDE 10 MG/ML (PF) SYRINGE
PREFILLED_SYRINGE | INTRAVENOUS | Status: AC
  Filled 2020-03-08: qty 10

## 2020-03-08 MED ORDER — LACTATED RINGERS IV SOLN: INTRAVENOUS | Status: DC

## 2020-03-08 MED ORDER — POVIDONE-IODINE 10 % EX SWAB
2.0000 "application " | Freq: Once | CUTANEOUS | Status: AC
  Administered 2020-03-08: 2 via TOPICAL

## 2020-03-08 MED ORDER — PROPOFOL 10 MG/ML IV BOLUS
INTRAVENOUS | Status: DC | PRN
  Administered 2020-03-08: 80 mg via INTRAVENOUS

## 2020-03-08 MED ORDER — CHLORHEXIDINE GLUCONATE CLOTH 2 % EX PADS
6.0000 | MEDICATED_PAD | Freq: Every day | CUTANEOUS | Status: DC
  Administered 2020-03-08 – 2020-03-13 (×5): 6 via TOPICAL

## 2020-03-08 MED ORDER — BUPIVACAINE HCL (PF) 0.5 % IJ SOLN
INTRAMUSCULAR | Status: DC | PRN
  Administered 2020-03-08: 11 mL

## 2020-03-08 MED ORDER — SUGAMMADEX SODIUM 200 MG/2ML IV SOLN
INTRAVENOUS | Status: DC | PRN
  Administered 2020-03-08: 200 mg via INTRAVENOUS

## 2020-03-08 MED ORDER — PHENYLEPHRINE 40 MCG/ML (10ML) SYRINGE FOR IV PUSH (FOR BLOOD PRESSURE SUPPORT)
PREFILLED_SYRINGE | INTRAVENOUS | Status: AC
  Filled 2020-03-08: qty 10

## 2020-03-08 MED ORDER — HYDROMORPHONE HCL 1 MG/ML IJ SOLN
1.0000 mg | INTRAMUSCULAR | Status: DC | PRN
  Administered 2020-03-08 – 2020-03-12 (×6): 1 mg via INTRAVENOUS
  Filled 2020-03-08 (×6): qty 1

## 2020-03-08 MED ORDER — MENTHOL 3 MG MT LOZG: 1.0000 | LOZENGE | OROMUCOSAL | Status: DC | PRN

## 2020-03-08 MED ORDER — ONDANSETRON HCL 4 MG/2ML IJ SOLN
4.0000 mg | Freq: Four times a day (QID) | INTRAMUSCULAR | Status: DC | PRN
  Administered 2020-03-13: 4 mg via INTRAVENOUS
  Filled 2020-03-08: qty 2

## 2020-03-08 MED ORDER — DEXAMETHASONE SODIUM PHOSPHATE 10 MG/ML IJ SOLN
INTRAMUSCULAR | Status: DC | PRN
  Administered 2020-03-08: 5 mg via INTRAVENOUS

## 2020-03-08 MED ORDER — TRANEXAMIC ACID-NACL 1000-0.7 MG/100ML-% IV SOLN
1000.0000 mg | INTRAVENOUS | Status: AC
  Administered 2020-03-08: 1000 mg via INTRAVENOUS
  Filled 2020-03-08: qty 100

## 2020-03-08 MED ORDER — PHENOL 1.4 % MT LIQD: 1.0000 | OROMUCOSAL | Status: DC | PRN

## 2020-03-08 MED ORDER — FENTANYL CITRATE (PF) 100 MCG/2ML IJ SOLN
25.0000 ug | INTRAMUSCULAR | Status: DC | PRN
  Administered 2020-03-08: 25 ug via INTRAVENOUS

## 2020-03-08 MED ORDER — PHENYLEPHRINE 40 MCG/ML (10ML) SYRINGE FOR IV PUSH (FOR BLOOD PRESSURE SUPPORT)
PREFILLED_SYRINGE | INTRAVENOUS | Status: DC | PRN
  Administered 2020-03-08: 200 ug via INTRAVENOUS
  Administered 2020-03-08: 120 ug via INTRAVENOUS

## 2020-03-08 MED ORDER — ASPIRIN EC 325 MG PO TBEC
325.0000 mg | DELAYED_RELEASE_TABLET | Freq: Every day | ORAL | Status: DC
  Administered 2020-03-09 – 2020-03-13 (×5): 325 mg via ORAL
  Filled 2020-03-08 (×5): qty 1

## 2020-03-08 MED ORDER — ONDANSETRON HCL 4 MG/2ML IJ SOLN
INTRAMUSCULAR | Status: DC | PRN
  Administered 2020-03-08: 4 mg via INTRAVENOUS

## 2020-03-08 MED ORDER — LIDOCAINE HCL (CARDIAC) PF 100 MG/5ML IV SOSY
PREFILLED_SYRINGE | INTRAVENOUS | Status: DC | PRN
  Administered 2020-03-08: 60 mg via INTRAVENOUS

## 2020-03-08 MED ORDER — FENTANYL CITRATE (PF) 100 MCG/2ML IJ SOLN
INTRAMUSCULAR | Status: DC | PRN
  Administered 2020-03-08 (×2): 50 ug via INTRAVENOUS

## 2020-03-08 MED ORDER — BUPIVACAINE HCL (PF) 0.5 % IJ SOLN
INTRAMUSCULAR | Status: AC
  Filled 2020-03-08: qty 30

## 2020-03-08 MED ORDER — ONDANSETRON HCL 4 MG PO TABS: 4.0000 mg | ORAL_TABLET | Freq: Four times a day (QID) | ORAL | Status: DC | PRN

## 2020-03-08 MED ORDER — FENTANYL CITRATE (PF) 100 MCG/2ML IJ SOLN
INTRAMUSCULAR | Status: AC
  Filled 2020-03-08: qty 2

## 2020-03-08 MED ORDER — 0.9 % SODIUM CHLORIDE (POUR BTL) OPTIME
TOPICAL | Status: DC | PRN
  Administered 2020-03-08: 1000 mL

## 2020-03-08 MED ORDER — METOCLOPRAMIDE HCL 5 MG PO TABS: 5.0000 mg | ORAL_TABLET | Freq: Three times a day (TID) | ORAL | Status: DC | PRN

## 2020-03-08 SURGICAL SUPPLY — 73 items
APL PRP STRL LF DISP 70% ISPRP (MISCELLANEOUS)
ARTICULEZE HEAD (Hips) ×2 IMPLANT
BLADE CLIPPER SURG (BLADE) ×2 IMPLANT
BLADE HEX COATED 2.75 (ELECTRODE) ×2 IMPLANT
BLADE SAW SAG 73X25 THK (BLADE) ×1
BLADE SAW SGTL 73X25 THK (BLADE) ×1 IMPLANT
BLADE SURG SZ10 CARB STEEL (BLADE) ×4 IMPLANT
BRUSH FEMORAL CANAL (MISCELLANEOUS) IMPLANT
CEMENT HV SMART SET (Cement) ×2 IMPLANT
CEMENT RESTRICTOR DEPUY SZ 4 (Cement) ×1 IMPLANT
CHLORAPREP W/TINT 26 (MISCELLANEOUS) ×1 IMPLANT
COVER SURGICAL LIGHT HANDLE (MISCELLANEOUS) ×2 IMPLANT
COVER WAND RF STERILE (DRAPES) IMPLANT
CUP ACETBLR 52 OD PINNACLE (Hips) ×1 IMPLANT
DRAPE C-ARM 42X120 X-RAY (DRAPES) ×1 IMPLANT
DRAPE C-ARMOR (DRAPES) ×1 IMPLANT
DRAPE INCISE IOBAN 66X45 STRL (DRAPES) ×2 IMPLANT
DRAPE ORTHO SPLIT 77X108 STRL (DRAPES) ×4
DRAPE POUCH INSTRU U-SHP 10X18 (DRAPES) ×2 IMPLANT
DRAPE SURG ORHT 6 SPLT 77X108 (DRAPES) ×2 IMPLANT
DRAPE U-SHAPE 47X51 STRL (DRAPES) ×2 IMPLANT
DRSG MEPILEX BORDER 4X12 (GAUZE/BANDAGES/DRESSINGS) ×1 IMPLANT
DRSG PAD ABDOMINAL 8X10 ST (GAUZE/BANDAGES/DRESSINGS) ×4 IMPLANT
DURAPREP 26ML APPLICATOR (WOUND CARE) ×2 IMPLANT
ELECT BLADE TIP CTD 4 INCH (ELECTRODE) ×2 IMPLANT
ELECT REM PT RETURN 15FT ADLT (MISCELLANEOUS) ×2 IMPLANT
ELIMINATOR HOLE APEX DEPUY (Hips) ×1 IMPLANT
EVACUATOR 1/8 PVC DRAIN (DRAIN) ×2 IMPLANT
FACESHIELD WRAPAROUND (MASK) ×10 IMPLANT
FACESHIELD WRAPAROUND OR TEAM (MASK) ×5 IMPLANT
GAUZE SPONGE 4X4 12PLY STRL (GAUZE/BANDAGES/DRESSINGS) ×2 IMPLANT
GAUZE XEROFORM 5X9 LF (GAUZE/BANDAGES/DRESSINGS) ×2 IMPLANT
GLOVE BIO SURGEON STRL SZ7.5 (GLOVE) ×2 IMPLANT
GLOVE ORTHO TXT STRL SZ7.5 (GLOVE) ×2 IMPLANT
GOWN STRL REUS W/TWL LRG LVL3 (GOWN DISPOSABLE) ×2 IMPLANT
HANDPIECE INTERPULSE COAX TIP (DISPOSABLE)
HEAD ARTICULEZE (Hips) IMPLANT
IMMOBILIZER KNEE 20 (SOFTGOODS) ×2 IMPLANT
IMMOBILIZER KNEE 20 THIGH 36 (SOFTGOODS) ×1 IMPLANT
KIT BASIN OR (CUSTOM PROCEDURE TRAY) ×2 IMPLANT
KIT TURNOVER KIT A (KITS) IMPLANT
LINER NEUTRAL 52X36MM PLUS 4 (Liner) ×1 IMPLANT
NDL HYPO 25X1 1.5 SAFETY (NEEDLE) ×1 IMPLANT
NDL MAYO CATGUT SZ4 TPR NDL (NEEDLE) ×1 IMPLANT
NDL SUT 6 .5 CRC .975X.05 MAYO (NEEDLE) IMPLANT
NEEDLE HYPO 25X1 1.5 SAFETY (NEEDLE) ×2 IMPLANT
NEEDLE MAYO CATGUT SZ4 (NEEDLE) ×2 IMPLANT
NEEDLE MAYO TAPER (NEEDLE) ×2
NS IRRIG 1000ML POUR BTL (IV SOLUTION) ×3 IMPLANT
PASSER SUT SWANSON 36MM LOOP (INSTRUMENTS) IMPLANT
PENCIL SMOKE EVACUATOR (MISCELLANEOUS) ×1 IMPLANT
PRESSURIZER FEMORAL UNIV (MISCELLANEOUS) IMPLANT
PROTECTOR NERVE ULNAR (MISCELLANEOUS) ×2 IMPLANT
SET HNDPC FAN SPRY TIP SCT (DISPOSABLE) IMPLANT
SPONGE LAP 18X18 RF (DISPOSABLE) ×2 IMPLANT
SPONGE LAP 4X18 RFD (DISPOSABLE) ×3 IMPLANT
STAPLER VISISTAT 35W (STAPLE) ×3 IMPLANT
STEM CEMENTED SZ6 STD (Stem) ×1 IMPLANT
STEM DIST FEM CENTRALIZR 13 (Hips) ×2 IMPLANT
SUCTION FRAZIER HANDLE 12FR (TUBING) ×2
SUCTION TUBE FRAZIER 12FR DISP (TUBING) ×1 IMPLANT
SUT ETHIBOND NAB CT1 #1 30IN (SUTURE) ×8 IMPLANT
SUT VIC AB 0 CT1 36 (SUTURE) ×1 IMPLANT
SUT VIC AB 1 CTX 36 (SUTURE) ×6
SUT VIC AB 1 CTX36XBRD ANBCTR (SUTURE) ×3 IMPLANT
SUT VIC AB 2-0 CT1 27 (SUTURE) ×6
SUT VIC AB 2-0 CT1 TAPERPNT 27 (SUTURE) ×3 IMPLANT
SUT VIC AB 4-0 PS2 27 (SUTURE) ×2 IMPLANT
SYR CONTROL 10ML LL (SYRINGE) ×2 IMPLANT
TOWEL OR 17X26 10 PK STRL BLUE (TOWEL DISPOSABLE) ×4 IMPLANT
TOWER CARTRIDGE SMART MIX (DISPOSABLE) ×1 IMPLANT
TRAY FOLEY MTR SLVR 16FR STAT (SET/KITS/TRAYS/PACK) ×1 IMPLANT
WATER STERILE IRR 1000ML POUR (IV SOLUTION) ×2 IMPLANT

## 2020-03-08 NOTE — Consult Note (Signed)
Urology Consult  Consulting MD: Dia Crawford, MD  CC: Hydronephrosis  HPI: This is a 86year old male who just underwent urgent orthopedic surgery for a left femoral neck fracture this afternoon.  Because of this, I am unable to interview the patient.  However, he does have a history of CKD stage IV.  From what I can see his creatinine was 1.77 in 2017.  It is now 2.12.  As part of his preoperative evaluation he underwent abdominal ultrasound for a possible abdominal mass.  This revealed bilateral hydronephrosis.  Urologic consultation is requested.  From what I can see in the admission note, he does have some lower urinary tract difficulty--hesitancy, slow stream.  A catheter was placed during the hospitalization but I do not know the volume of urine returned.  PMH: Past Medical History:  Diagnosis Date  . Chicken pox   . Frequent headaches   . Glaucoma   . Hay fever   . History of blood transfusion   . HTN (hypertension)   . Hyperlipidemia   . Hyperlipidemia   . Migraines   . 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([place])     PSH: Past Surgical History:  Procedure Laterality Date  . CHOLECYSTECTOMY, LAPAROSCOPIC    . TONSILLECTOMY  1942    Allergies: No Known Allergies  Medications: Medications Prior to Admission  Medication Sig Dispense Refill Last Dose  . Aromatic Inhalants (VICKS BABYRUB) OINT Apply 1 application topically as needed (skin irritations). Adult strength   Past Week at Unknown time  . carboxymethylcellulose (REFRESH PLUS) 0.5 % SOLN Place 1 drop into both eyes 3 (three) times daily as needed (dry eyes).   03/06/2020 at Unknown time  . latanoprost (XALATAN) 0.005 % ophthalmic solution Place 1 drop into both eyes at bedtime.    03/05/2020  . Polyethyl Glycol-Propyl Glycol (SYSTANE) 0.4-0.3 % GEL ophthalmic gel Place 1 application into both eyes in the morning and at bedtime.   03/06/2020 at Unknown time  . SIMBRINZA 1-0.2 % SUSP Place 1 drop into both eyes in the  morning and at bedtime.    03/06/2020 at Unknown time     Social History: Social History   Socioeconomic History  . Marital status: Married    Spouse name: Not on file  . Number of children: Not on file  . Years of education: Not on file  . Highest education level: Not on file  Occupational History  . Not on file  Tobacco Use  . Smoking status: Former Smoker    Start date: 04/01/1968    Quit date: 01/11/1977    Years since quitting: 43.1  . Smokeless tobacco: Never Used  Substance and Sexual Activity  . Alcohol use: No  . Drug use: No  . Sexual activity: Never  Other Topics Concern  . Not on file  Social History Narrative  . Not on file   Social Determinants of Health   Financial Resource Strain:   . Difficulty of Paying Living Expenses: Not on file  Food Insecurity:   . Worried About Charity fundraiser in the Last Year: Not on file  . Ran Out of Food in the Last Year: Not on file  Transportation Needs:   . Lack of Transportation (Medical): Not on file  . Lack of Transportation (Non-Medical): Not on file  Physical Activity:   . Days of Exercise per Week: Not on file  . Minutes of Exercise per Session: Not on file  Stress:   . Feeling of  Stress : Not on file  Social Connections:   . Frequency of Communication with Friends and Family: Not on file  . Frequency of Social Gatherings with Friends and Family: Not on file  . Attends Religious Services: Not on file  . Active Member of Clubs or Organizations: Not on file  . Attends Archivist Meetings: Not on file  . Marital Status: Not on file  Intimate Partner Violence:   . Fear of Current or Ex-Partner: Not on file  . Emotionally Abused: Not on file  . Physically Abused: Not on file  . Sexually Abused: Not on file    Family History: Family History  Problem Relation Age of Onset  . Liver disease Mother   . Skin cancer Father   . Skin cancer Paternal Grandfather   . Diabetes Maternal Grandfather      Review of Systems: Level V caveat  Physical Exam: @VITALS2 @ General: No acute distress.  He is unable to be aroused after his anesthesia. Head:  Normocephalic.  Atraumatic.  Marland Kitchen Temporal wasting noted ENT:  EOMI.  Mucous membranes dry Neck:  Supple.  No lymphadenopathy. CV:  Regular rate. Pulmonary: Equal effort bilaterally.   Skin:  Normal turgor.  No visible rash. Extremity: No gross deformity of extremities.  Neurologic: Unable to assess   Studies:  Recent Labs    03/07/20 1156 03/08/20 0633  HGB 11.5* 9.5*  WBC 12.3* 7.2  PLT 229 177    Recent Labs    03/07/20 1156 03/08/20 0633  NA 139 139  K 4.6 4.1  CL 109 110  CO2 19* 18*  BUN 38* 43*  CREATININE 2.06* 2.12*  CALCIUM 8.6* 7.8*  GFRNONAA 31* 30*     Recent Labs    03/07/20 1156  INR 1.1     Invalid input(s): ABG  I reviewed the patient's laboratory studies as well as his abdominal ultrasound images.  He does have mild bilateral hydronephrosis.  I have ordered a noncontrasted CT scan for further evaluation  Assessment: CKD stage IV, from what I can see his creatinine is fairly stable over the past several years.  However, he does have mild hydronephrosis on his recent ultrasound.  As he does have urinary difficulties, this may be secondary to bladder outlet obstruction.  Plan: I have ordered a CT, noncontrasted.  Hopefully this can be done tomorrow.  If he does have pathology between his kidneys and bladder, this will need to be addressed.  If he does have resolving hydro with catheter placement, he will need, at least in the next few days, to have a catheter left in until he is stable and strong enough for a voiding trial.  I will follow-up during this hospitalization.     Pager:[phone]

## 2020-03-08 NOTE — Interval H&P Note (Signed)
History and Physical Interval Note:  03/08/2020 2:32 PM  John Walton  has presented today for surgery, with the diagnosis of left femoral neck intertroch fracture.  The various methods of treatment have been discussed with the patient and family. After consideration of risks, benefits and other options for treatment, the patient has consented to  Procedure(s): TOTAL HIP ARTHROPLASTY (Left) as a surgical intervention.  The patient's history has been reviewed, patient examined, no change in status, stable for surgery.  I have reviewed the patient's chart and labs.  Questions were answered to the patient's satisfaction.   Increased risks discussed due to noncompliance in past with RBBB refusal of further eval, DM, HTN, Renal disease with elevated creatinine, anemia. With surgery he will not be able ambulate. Has severe hip OA. Will proceed with THA left as planned. Discussed with patient. He requests we proceed.   Marybelle Killings

## 2020-03-08 NOTE — Progress Notes (Signed)
Orthopedic Tech Progress Note Patient Details:  John Walton 10-26-1932 [id number] Unable to use overhead frame properly.  Patient ID: [name], male   DOB: 1933/03/06, 86 y.o.   MRN: [id number]   Braulio Bosch 03/08/2020, 9:23 PM

## 2020-03-08 NOTE — Progress Notes (Signed)
Bladder scanner shows 737 ml of urine , 16 F foley catheter inserted by NT, patient tolerated the procedure and we will continue to monitor.

## 2020-03-08 NOTE — Anesthesia Procedure Notes (Signed)
Procedure Name: Intubation Performed by: Rosaland Lao, CRNA Pre-anesthesia Checklist: Patient identified, Emergency Drugs available, Suction available and Patient being monitored Patient Re-evaluated:Patient Re-evaluated prior to induction Oxygen Delivery Method: Circle system utilized Preoxygenation: Pre-oxygenation with 100% oxygen Induction Type: IV induction Ventilation: Mask ventilation without difficulty and Oral airway inserted - appropriate to patient size Laryngoscope Size: Sabra Heck and 2 Grade View: Grade I Tube type: Oral Number of attempts: 1 Airway Equipment and Method: Stylet and Oral airway Placement Confirmation: ETT inserted through vocal cords under direct vision,  positive ETCO2 and breath sounds checked- equal and bilateral Secured at: 22 cm Tube secured with: Tape Dental Injury: Teeth and Oropharynx as per pre-operative assessment

## 2020-03-08 NOTE — Op Note (Signed)
Preop diagnosis: Left femoral neck fracture with intertrochanteric extension and pre-existing severe hip osteoarthritis  Postop diagnosis same  Procedure: Left total hip arthroplasty, posterior approach.  Surgeon: Rodell Perna, MD  Assistant: Benjiman Core, PA-C medically necessary and present for the entire procedure  EBL: 350 cc.  Anesthesia: General orotracheal plus Marcaine skin local  Implants:Depuy size 6 standard stem, size 4 cement restrictor, 52 time 31mm plus 4 neutral liner, plus 5 metal ball, cemented stem. Press fit acetabulum Gription with available dome screws.  Apex hole in the nail was placed and no dome screw was needed +4 neutral liner was placed.  High offset stem was selected based on the patient's x-ray of the pelvis on the other opposite hip has a fair amount of offset.  We pared the femur with cookie-cutter canal reamer.  Fracture extended down to the lesser trochanter but there was a split in fragment where the calcar was placed but the anterior posterior portion were intact and we will able to cut the neck in the normal position just with a gap medially on the neck.  Progressed up in broaching after reaming and a 5 gave a nice fit set slightly proud what would normally be.  Primary #5 stem was opened the centralizer was impacted into the tip and we placed a cement restrictor based on trials.  Permanent stem was stuck down actually the centralizer caught as we took it out it broke off we took it out with the long pituitary.  We put the broach and and then did trials off the broach and there was good stability with posterior we selected a +5 based on the neck cut being slightly lower than normal.  Cement was vacuum mixed inserted stem was held in good position cement set up to 15 minutes and we retrialed and a +5 was selected there was good stability flexion to 90 internal rotation 80 degrees before there was some subluxation.  Trace shuck.  Coronal rim was used for positioning of  the acetabular cup and version on the femur with implantation was maintained and cemented solid.  All excessive cement been removed there was good stability of the permanent ball was popped on identical findings of stability and then closure of the posterior capsule with interrupted #1 Ethibond distal aspect the tensor fascia was split with Ethibond and #1 Vicryl the rest the way and gluteus maximus.  2-0 Vicryl subtendinous tissue marked infiltration skin staple closure postop dressing and knee immobilizer transfer the care room.  Procedure: After induction general anesthesia Ancef prophylaxis standard prepping and draping with patient lateral position with axillary roll impervious stockinette sterile skin marker Coban were all applied and 2 Betadine Steri-Drape were used to seal the skin.  Posterior approach was made since there was extension of the fracture line down to the lesser trochanter and the inferior aspect of it and on CT scan the crack line extended down and I was concerned I might have to either use a calcar replacement or possibly cerclage wire.  General retractor was placed to place piriformis tagged with suture and then cut posterior capsule was opened with a T head was removed with with several different grabber since there was no corkscrew in the set.  Once the head was removed sequential reaming up to 51 for a 52 press-fit cup

## 2020-03-08 NOTE — Progress Notes (Signed)
Recd pt from pacu, wife at bedside

## 2020-03-08 NOTE — Progress Notes (Signed)
  Echocardiogram 2D Echocardiogram has been performed.  John Walton 03/08/2020, 8:53 AM

## 2020-03-08 NOTE — Transfer of Care (Signed)
Immediate Anesthesia Transfer of Care Note  Patient: John Walton  Procedure(s) Performed: Procedure(s): TOTAL HIP ARTHROPLASTY (Left)  Patient Location: PACU  Anesthesia Type:General  Level of Consciousness: Alert, Awake, Oriented  Airway & Oxygen Therapy: Patient Spontanous Breathing  Post-op Assessment: Report given to RN  Post vital signs: Reviewed and stable  Last Vitals:  Vitals:   03/08/20 1329 03/08/20 1423  BP: (!) 114/58 133/76  Pulse: (!) 101 (!) 106  Resp: 18 18  Temp: 37.1 C 36.8 C  SpO2: 09% 81%    Complications: No apparent anesthesia complications

## 2020-03-08 NOTE — Progress Notes (Signed)
Orthopedic Tech Progress Note Patient Details:  John Walton Feb 22, 1933 [id number]  Patient ID: John Walton, male   DOB: [birth year], 86 y.o.   MRN: [id number]   John Walton  03/08/2020, 9:02 AMCheck bucks traction, weights were on the floor. I shorten the rope.

## 2020-03-08 NOTE — Consult Note (Signed)
Reason for Consult:left femoral neck fracture with Intertrochanteric extension Referring Physician: Dia Crawford, MD  John Walton is an 86 y.o. male.  HPI: 86 year old male lives with his wife with problems with hypertension type 2 diabetes stage IV kidney disease had mechanical fall at home with immediate left hip pain and inability to ambulate.  Pain is severe he was brought to emergency room by EMS where x-rays demonstrated a left femoral neck fracture with intertrochanteric extension.  Patient has significant bilateral hip osteoarthritis.  Patient is also followed at the [facility].  He has had right bundle blanch block, cardiology recommended stress test but patient declined.  Past Medical History:  Diagnosis Date  . Chicken pox   . Frequent headaches   . Glaucoma   . Hay fever   . History of blood transfusion   . HTN (hypertension)   . Hyperlipidemia   . Hyperlipidemia   . Migraines   . 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[facility])     Past Surgical History:  Procedure Laterality Date  . CHOLECYSTECTOMY, LAPAROSCOPIC    . TONSILLECTOMY  1942    Family History  Problem Relation Age of Onset  . Liver disease Mother   . Skin cancer Father   . Skin cancer Paternal Grandfather   . Diabetes Maternal Grandfather     Social History:  reports that he quit smoking about 43 years ago. He started smoking about 51 years ago. He has never used smokeless tobacco. He reports that he does not drink alcohol and does not use drugs.  Allergies: No Known Allergies  Medications: I have reviewed the patient's current medications.  Results for orders placed or performed during the hospital encounter of 03/07/20 (from the past 48 hour(s))  CBC WITH DIFFERENTIAL     Status: Abnormal   Collection Time: 03/07/20 11:56 AM  Result Value Ref Range   WBC 12.3 (H) 4.0 - 10.5 K/uL   RBC 4.00 (L) 4.22 - 5.81 MIL/uL   Hemoglobin 11.5 (L) 13.0 - 17.0 g/dL   HCT 35.3 (L) 39 - 52 %    MCV 88.3 80.0 - 100.0 fL   MCH 28.8 26.0 - 34.0 pg   MCHC 32.6 30.0 - 36.0 g/dL   RDW 13.3 11.5 - 15.5 %   Platelets 229 150 - 400 K/uL   nRBC 0.0 0.0 - 0.2 %   Neutrophils Relative % 87 %   Neutro Abs 10.6 (H) 1.7 - 7.7 K/uL   Lymphocytes Relative 4 %   Lymphs Abs 0.5 (L) 0.7 - 4.0 K/uL   Monocytes Relative 9 %   Monocytes Absolute 1.1 (H) 0.1 - 1.0 K/uL   Eosinophils Relative 0 %   Eosinophils Absolute 0.0 0.0 - 0.5 K/uL   Basophils Relative 0 %   Basophils Absolute 0.0 0.0 - 0.1 K/uL   Immature Granulocytes 0 %   Abs Immature Granulocytes 0.05 0.00 - 0.07 K/uL    Comment: Performed at [facility], [place] [address] [place], [place] [address]  Protime-INR     Status: None   Collection Time: 03/07/20 11:56 AM  Result Value Ref Range   Prothrombin Time 13.8 11.4 - 15.2 seconds   INR 1.1 0.8 - 1.2    Comment: (NOTE) INR goal varies based on device and disease states. Performed at [facility] [place] [address], [place], [place] [address]   Comprehensive metabolic panel     Status: Abnormal   Collection Time: 03/07/20 11:56 AM  Result Value Ref Range   Sodium 139 135 - 145 mmol/L   Potassium 4.6 3.5 - 5.1 mmol/L   Chloride 109 98 - 111 mmol/L   CO2 19 (L) 22 - 32 mmol/L   Glucose, Bld 164 (H) 70 - 99 mg/dL    Comment: Glucose reference range applies only to samples taken after fasting for at least 8 hours.   BUN 38 (H) 8 - 23 mg/dL   Creatinine, Ser 2.06 (H) 0.61 - 1.24 mg/dL   Calcium 8.6 (L) 8.9 - 10.3 mg/dL   Total Protein 7.8 6.5 - 8.1 g/dL   Albumin 4.2 3.5 - 5.0 g/dL   AST 23 15 - 41 U/L   ALT 15 0 - 44 U/L   Alkaline Phosphatase 92 38 - 126 U/L   Total Bilirubin 0.6 0.3 - 1.2 mg/dL   GFR, Estimated 31 (L) >60 mL/min    Comment: (NOTE) Calculated using the CKD-EPI Creatinine Equation (2021)    Anion gap 11 5 - 15    Comment: Performed at [facility], [place] [address] [place], [place] [address]  ABO/Rh      Status: None   Collection Time: 03/07/20 11:56 AM  Result Value Ref Range   ABO/RH(D)      A POS Performed at [facility], [place] [address] [place], [place] [address]   TSH     Status: Abnormal   Collection Time: 03/07/20 11:56 AM  Result Value Ref Range   TSH 0.278 (L) 0.350 - 4.500 uIU/mL    Comment: Performed by a 3rd Generation assay with a functional sensitivity of <=0.01 uIU/mL. Performed at [facility], [place] [address] [place], [place] [address]   Lipid panel     Status: Abnormal   Collection Time: 03/07/20 11:56 AM  Result Value Ref Range   Cholesterol 223 (H) 0 - 200 mg/dL   Triglycerides 114 <150 mg/dL   HDL 45 >40 mg/dL   Total CHOL/HDL Ratio 5.0 RATIO   VLDL 23 0 - 40 mg/dL   LDL Cholesterol 155 (H) 0 - 99 mg/dL    Comment:        Total Cholesterol/HDL:CHD Risk Coronary Heart Disease Risk Table                     Men   Women  1/2 Average Risk   3.4   3.3  Average Risk       5.0   4.4  2 X Average Risk   9.6   7.1  3 X Average Risk  23.4   11.0        Use the calculated Patient Ratio above and the CHD Risk Table to determine the patient's CHD Risk.        ATP III CLASSIFICATION (LDL):  <100     mg/dL   Optimal  100-129  mg/dL   Near or Above                    Optimal  130-159  mg/dL   Borderline  160-189  mg/dL   High  >190     mg/dL   Very High Performed at [place] [address], [place], [place] [address]   Troponin I (High Sensitivity)     Status: None   Collection Time: 03/07/20 11:56 AM  Result Value Ref Range   Troponin I (High Sensitivity) 8 <18 ng/L    Comment: (NOTE)  Elevated high sensitivity troponin I (hsTnI) values and significant  changes across serial measurements may suggest ACS but many other  chronic and acute conditions are known to elevate hsTnI results.  Refer to the "Links" section for chest pain algorithms and additional  guidance. Performed at [facility], [place] [address], [place], [place] [address]   Resp Panel by RT-PCR (Flu A&B, Covid) Nasopharyngeal Swab     Status: None   Collection Time: 03/07/20 11:57 AM   Specimen: Nasopharyngeal Swab; Nasopharyngeal(NP) swabs in vial transport medium  Result Value Ref Range   SARS Coronavirus 2 by RT PCR NEGATIVE NEGATIVE    Comment: (NOTE) SARS-CoV-2 target nucleic acids are NOT DETECTED.  The SARS-CoV-2 RNA is generally detectable in upper respiratory specimens during the acute phase of infection. The lowest concentration of SARS-CoV-2 viral copies this assay can detect is 138 copies/mL. A negative result does not preclude SARS-Cov-2 infection and should not be used as the sole basis for treatment or other patient management decisions. A negative result may occur with  improper specimen collection/handling, submission of specimen other than nasopharyngeal swab, presence of viral mutation(s) within the areas targeted by this assay, and inadequate number of viral copies(<138 copies/mL). A negative result must be combined with clinical observations, patient history, and epidemiological information. The expected result is Negative.  Fact Sheet for Patients:  EntrepreneurPulse.com.au  Fact Sheet for Healthcare Providers:  IncredibleEmployment.be  This test is no t yet approved or cleared by the [place] FDA and  has been authorized for detection and/or diagnosis of SARS-CoV-2 by FDA under an Emergency Use Authorization (EUA). This EUA will remain  in effect (meaning this test can be used) for the duration of the COVID-19 declaration under Section 564(b)(1) of the Act, 21 U.S.C.section 360bbb-3(b)(1), unless the authorization is terminated  or revoked sooner.       Influenza A by PCR NEGATIVE NEGATIVE   Influenza B by PCR NEGATIVE NEGATIVE    Comment: (NOTE) The Xpert Xpress SARS-CoV-2/FLU/RSV plus assay is intended as an  aid in the diagnosis of influenza from Nasopharyngeal swab specimens and should not be used as a sole basis for treatment. Nasal washings and aspirates are unacceptable for Xpert Xpress SARS-CoV-2/FLU/RSV testing.  Fact Sheet for Patients: EntrepreneurPulse.com.au  Fact Sheet for Healthcare Providers: IncredibleEmployment.be  This test is not yet approved or cleared by the [place] FDA and has been authorized for detection and/or diagnosis of SARS-CoV-2 by FDA under an Emergency Use Authorization (EUA). This EUA will remain in effect (meaning this test can be used) for the duration of the COVID-19 declaration under Section 564(b)(1) of the Act, 21 U.S.C. section 360bbb-3(b)(1), unless the authorization is terminated or revoked.  Performed at [facility], [place] [address] [place], [place] [address]   Type and screen [place]     Status: None   Collection Time: 03/07/20 12:19 PM  Result Value Ref Range   ABO/RH(D) A POS    Antibody Screen NEG    Sample Expiration      03/10/2020,2359 Performed at [facility], [place] [address] [place],  [address]   Urinalysis, Routine w reflex microscopic     Status: Abnormal   Collection Time: 03/07/20 12:50 PM  Result Value Ref Range   Color, Urine YELLOW YELLOW   APPearance CLEAR CLEAR   Specific Gravity, Urine 1.012 1.005 - 1.030   pH 5.0 5.0 - 8.0   Glucose, UA NEGATIVE NEGATIVE  mg/dL   Hgb urine dipstick SMALL (A) NEGATIVE   Bilirubin Urine NEGATIVE NEGATIVE   Ketones, ur NEGATIVE NEGATIVE mg/dL   Protein, ur 100 (A) NEGATIVE mg/dL   Nitrite NEGATIVE NEGATIVE   Leukocytes,Ua NEGATIVE NEGATIVE   RBC / HPF 0-5 0 - 5 RBC/hpf   WBC, UA 0-5 0 - 5 WBC/hpf   Bacteria, UA NONE SEEN NONE SEEN   Squamous Epithelial / LPF 0-5 0 - 5   Mucus PRESENT     Comment: Performed at [facility], [place] [address]  [place], [place] [address]  Hemoglobin A1c     Status: Abnormal   Collection Time: 03/07/20  5:00 PM  Result Value Ref Range   Hgb A1c MFr Bld 6.7 (H) 4.8 - 5.6 %    Comment: (NOTE) Pre diabetes:          5.7%-6.4%  Diabetes:              >6.4%  Glycemic control for   <7.0% adults with diabetes    Mean Plasma Glucose 145.59 mg/dL    Comment: Performed at [place] [address] [place], [place] [address]  Basic metabolic panel     Status: Abnormal   Collection Time: 03/08/20  6:33 AM  Result Value Ref Range   Sodium 139 135 - 145 mmol/L   Potassium 4.1 3.5 - 5.1 mmol/L   Chloride 110 98 - 111 mmol/L   CO2 18 (L) 22 - 32 mmol/L   Glucose, Bld 151 (H) 70 - 99 mg/dL    Comment: Glucose reference range applies only to samples taken after fasting for at least 8 hours.   BUN 43 (H) 8 - 23 mg/dL   Creatinine, Ser 2.12 (H) 0.61 - 1.24 mg/dL   Calcium 7.8 (L) 8.9 - 10.3 mg/dL   GFR, Estimated 30 (L) >60 mL/min    Comment: (NOTE) Calculated using the CKD-EPI Creatinine Equation (2021)    Anion gap 11 5 - 15    Comment: Performed at [facility], [place] [address] [place], [place] [address]  CBC with Differential/Platelet     Status: Abnormal   Collection Time: 03/08/20  6:33 AM  Result Value Ref Range   WBC 7.2 4.0 - 10.5 K/uL   RBC 3.29 (L) 4.22 - 5.81 MIL/uL   Hemoglobin 9.5 (L) 13.0 - 17.0 g/dL   HCT 29.1 (L) 39 - 52 %   MCV 88.4 80.0 - 100.0 fL   MCH 28.9 26.0 - 34.0 pg   MCHC 32.6 30.0 - 36.0 g/dL   RDW 13.4 11.5 - 15.5 %   Platelets 177 150 - 400 K/uL   nRBC 0.0 0.0 - 0.2 %   Neutrophils Relative % 77 %   Neutro Abs 5.7 1.7 - 7.7 K/uL   Lymphocytes Relative 8 %   Lymphs Abs 0.5 (L) 0.7 - 4.0 K/uL   Monocytes Relative 13 %   Monocytes Absolute 0.9 0.1 - 1.0 K/uL   Eosinophils Relative 1 %   Eosinophils Absolute 0.1 0.0 - 0.5 K/uL   Basophils Relative 0 %   Basophils Absolute 0.0 0.0 - 0.1 K/uL   Immature Granulocytes 1 %   Abs Immature  Granulocytes 0.05 0.00 - 0.07 K/uL    Comment: Performed at [facility], [place] [address], [place], [place] [address]  Magnesium     Status: None   Collection Time: 03/08/20  6:33 AM  Result Value Ref Range  Magnesium 2.1 1.7 - 2.4 mg/dL    Comment: Performed at [facility], [place] [address], [place], [place] [address]    DG Chest 1 View  Result Date: 03/07/2020 CLINICAL DATA:  Preop exam. EXAM: CHEST  1 VIEW COMPARISON:  Chest x-ray 4 06/20/1998. FINDINGS: Mediastinum and hilar structures normal. Heart size normal. Low lung volumes with mild left base subsegmental axis. No pleural effusion or pneumothorax. Degenerative change thoracic spine and both shoulders. IMPRESSION: Low lung volumes with mild left base subsegmental atelectasis. Electronically Signed   By: Marcello Moores  Register   On: 03/07/2020 13:07   DG Shoulder Right  Result Date: 03/07/2020 CLINICAL DATA:  Right shoulder pain after a fall last night. EXAM: RIGHT SHOULDER - 2+ VIEW COMPARISON:  None. FINDINGS: No acute fracture or dislocation is identified. Mild-to-moderate joint space narrowing and marginal spurring are noted at the acromioclavicular joint. Narrowing of the acromial humeral interval could indicate a rotator cuff tear. IMPRESSION: No acute osseous abnormality identified. Electronically Signed   By: Logan Bores M.D.   On: 03/07/2020 13:05   CT HEAD WO CONTRAST  Result Date: 03/07/2020 CLINICAL DATA:  Head trauma, minor (Age >= 65y) EXAM: CT HEAD WITHOUT CONTRAST TECHNIQUE: Contiguous axial images were obtained from the base of the skull through the vertex without intravenous contrast. COMPARISON:  04/23/2015 and prior FINDINGS: Brain: No acute infarct or intracranial hemorrhage. No mass lesion. No midline shift, ventriculomegaly or extra-axial fluid collection. Mild cerebral atrophy with ex vacuo dilatation. Chronic microvascular ischemic changes. Remote right frontal insult. Vascular: No  hyperdense vessel or unexpected calcification. Bilateral carotid siphon atherosclerotic calcifications. Skull: Negative for fracture or focal lesion. Sinuses/Orbits: No acute orbital finding. Pneumatized paranasal sinuses and mastoid air cells. Other: Right frontal scalp hematoma. IMPRESSION: 1. No acute intracranial process.  Remote right frontal insult. 2. Mild cerebral atrophy and chronic microvascular ischemic changes. 3. Right frontal scalp hematoma. Electronically Signed   By: Primitivo Gauze M.D.   On: 03/07/2020 14:49   US Abdomen Complete  Result Date: 03/07/2020 CLINICAL DATA:  Abdominal distension EXAM: ABDOMEN ULTRASOUND COMPLETE COMPARISON:  None FINDINGS: Gallbladder: Surgically absent Common bile duct: Diameter: 5 mm, normal Liver: Normal echogenicity without mass or nodularity. Portal vein is patent on color Doppler imaging with normal direction of blood flow towards the liver. IVC: Short segment of intrahepatic IVC normal appearance, remainder obscured by bowel gas Pancreas: Portion of head to and proximal tail normal appearance, remainder obscured by bowel gas Spleen: Normal appearance, 4.8 cm length Right Kidney: Length: 9.8 cm. Normal cortical thickness. Increased cortical echogenicity. No mass or shadowing calcification. Mild hydronephrosis. Left Kidney: Length: 9.9 cm. Normal cortical thickness. Increased cortical echogenicity. No mass or calcification. Mild hydronephrosis. Abdominal aorta: Small portion of mid abdominal aorta normal caliber, remainder obscured by bowel gas Other findings: No free fluid IMPRESSION: Post cholecystectomy. Medical renal disease changes of both kidneys with mild BILATERAL hydronephrosis of uncertain etiology. Inadequate visualization of pancreas and aorta. Electronically Signed   By: Lavonia Dana M.D.   On: 03/07/2020 18:25   CT HIP LEFT WO CONTRAST  Result Date: 03/07/2020 CLINICAL DATA:  Left hip pain after fall.  Abnormal x-ray. EXAM: CT OF THE LEFT  HIP WITHOUT CONTRAST TECHNIQUE: Multidetector CT imaging of the left hip was performed according to the standard protocol. Multiplanar CT image reconstructions were also generated. COMPARISON:  X-ray 03/07/2020 FINDINGS: Bones/Joint/Cartilage Acute nondisplaced fracture of the left femoral neck and intertrochanteric region. No significant impaction or angulation. No  fracture involvement of the femoral head. Severe osteoarthritis of the left hip joint with complete joint space loss, subchondral sclerosis/cystic change, and marginal osteophyte formation. Visualized portion of the left hemipelvis is intact without fracture. The inferior aspect of the left SI joint is intact without diastasis. Ligaments Suboptimally assessed by CT. Muscles and Tendons No acute musculotendinous abnormality by CT. Soft tissues There is induration within the subcutaneous soft tissues overlying the greater trochanter without organized soft tissue collection or hematoma. Visualized prostate gland is enlarged. Scattered vascular calcifications. IMPRESSION: 1. Acute nondisplaced fracture of the left femoral neck and intertrochanteric region. 2. 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 3. Enlarged prostate gland. Electronically Signed   By: Davina Poke D.O.   On: 03/07/2020 15:31   DG Hip Unilat With Pelvis 2-3 Views Left  Result Date: 03/07/2020 CLINICAL DATA:  Left hip pain after fall EXAM: DG HIP (WITH OR WITHOUT PELVIS) 2-3V LEFT COMPARISON:  None. FINDINGS: Cortical irregularity along the lateral aspect of the left femoral neck suspicious for a nondisplaced fracture. No displacement or angulation. Severe degenerative changes of the bilateral hip joints. Pelvic bony ring intact. Advanced vascular calcifications. IMPRESSION: Findings suspicious for a nondisplaced fracture of the left femoral neck. Electronically Signed   By: Davina Poke D.O.   On: 03/07/2020 13:04    ROS patient has decreased hearing acuity glaucoma with  decreased visualization.  Type 2 diabetes stage IV kidney disease, hypertension.  Right bundle branch block without angina. Blood pressure (!) 120/58, pulse 92, temperature 98.9 F (37.2 C), temperature source Oral, resp. rate 16, height 5\' 6"  (1.676 m), weight 62.8 kg, SpO2 98 %. Physical Exam Constitutional:      Appearance: Normal appearance.  HENT:     Head: Normocephalic.     Right Ear: Tympanic membrane normal.     Left Ear: Tympanic membrane normal.     Nose: Nose normal.  Eyes:     Extraocular Movements: Extraocular movements intact.     Pupils: Pupils are equal, round, and reactive to light.  Cardiovascular:     Rate and Rhythm: Normal rate.     Pulses: Normal pulses.  Pulmonary:     Effort: Pulmonary effort is normal.     Breath sounds: No wheezing.  Abdominal:     Palpations: Abdomen is soft.  Musculoskeletal:     Cervical back: Normal range of motion.  Skin:    General: Skin is warm.     Capillary Refill: Capillary refill takes less than 2 seconds.  Neurological:     General: No focal deficit present.     Mental Status: He is alert.  Psychiatric:        Mood and Affect: Mood normal.        Behavior: Behavior normal.        Thought Content: Thought content normal.   Left lower extremity in Buck's traction 2+ pedal pulses.  Sensation of the foot is intact.  Pain with hip range of motion on the left.  Assessment/Plan: Patient has pre-existing severe hip osteoarthritis.  Incidental enlarged prostate gland.  Patient has a femoral neck fracture with extension to the intertrochanteric region.  Normally have patient only had femoral neck fracture plan will be anterior hip arthroplasty since he has pre-existing severe arthritis.  With extension into the intertrochanteric region plan to be posterior approach she likely may need a cable applied to the femoral neck and may require cemented femoral stem.  Risk of dislocation infection discussed with patient  questions were  elicited and answered.  Risks of fracture extension and revision was discussed.  He understands request to proceed.  Patient increased risk due to age, diabetes, kidney disease with elevated creatinine 2.06, anemia.  Marybelle Killings 03/08/2020, 7:24 AM

## 2020-03-08 NOTE — Anesthesia Postprocedure Evaluation (Signed)
Anesthesia Post Note  Patient: John Walton  Procedure(s) Performed: TOTAL HIP ARTHROPLASTY (Left Hip)     Patient location during evaluation: PACU Anesthesia Type: General Level of consciousness: awake Pain management: pain level controlled Vital Signs Assessment: post-procedure vital signs reviewed and stable Respiratory status: spontaneous breathing, nonlabored ventilation, respiratory function stable and patient connected to nasal cannula oxygen Cardiovascular status: blood pressure returned to baseline and stable Postop Assessment: no apparent nausea or vomiting Anesthetic complications: no   No complications documented.  Last Vitals:  Vitals:   03/08/20 1745 03/08/20 1800  BP: (!) 124/56 (!) 121/50  Pulse: 90 88  Resp: 14 14  Temp:  (!) 36.4 C  SpO2: 94% 96%    Last Pain:  Vitals:   03/08/20 1423  TempSrc: Oral  PainSc: 0-No pain                 Daymian Lill P Kimetha Trulson

## 2020-03-08 NOTE — Progress Notes (Signed)
PROGRESS NOTE    John Walton  IFO:[id number] DOB: 10-20-1932 DOA: 03/07/2020 PCP: [name], MD     Brief Narrative:  86 yo WM PMHx HTN, HLD, glaucoma, RBBB, DMII, CKD Stage 4, noncompliance to meds   Presented after a mechanical fall at home.  Patient is accompanied by his wife in the ER who helps provide further collateral information.  He was going up the stairs and states that he missed a step and fell onto the floor.  This happened around 10 PM on Monday evening.  It took several hours for his wife and another family member to get him in the chair and back to bed.  Due to his ongoing left hip pain he finally presented for further evaluation.  He follows outpatient with a PCP per his wife, [name].  He also follows with the [place], [name]. No records are available for review.  Only past records in epic are from 2016.  He had a right bundle branch block at that time and was evaluated by cardiology with recommendations for undergoing a stress test at that time, however he declined and has not presented back since.  He also endorses that he does not take any routine medications at home.  He has lived a natural lifestyle and only uses a herbal substance to help with the neuropathic pains he is describing in his feet. He states that he controlled his diabetes with diet and exercise.  We talked about his uncontrolled blood pressure in the ER; he does endorse some pain however his wife states that his blood pressure has been elevated at office visits as well.  He does not take any medications for his blood pressure. During exam he was noted to have a rather large and noticeable abdominal mass.  It was not pulsating but when asked what it was, the patient could not elaborate and it appears has not been evaluated or at least they cannot tell me so. We also discussed his renal function.  They are aware that it is decreased but also could not further elaborate.  He does endorse that  he has difficulty voiding oftentimes and can take up to 30 minutes to initiate voiding.  On work-up in the ER regarding his fall he underwent imaging.  His wife does note that he had a small cyst on his right forehead that they knew about but after his fall and hitting his head he did have an enlarged amount of swelling on his right forehead. CT head was negative for intracranial process but did show a right frontal scalp hematoma.  He has a remote right frontal infarct noted as well.  Mild cerebral atrophy.  Right shoulder x-ray negative for acute abnormality.  CXR unremarkable, some atelectasis. CT left hip shows acute nondisplaced fracture of left femoral neck and intertrochanteric region.  Severe osteoarthritis of left hip joint. Also showed enlarged prostate gland.  He was evaluated by orthopedic surgery with tentative plans for operative repair and is admitted for further work-up and treatment.   I have personally briefly reviewed patient's old medical records in [facility] and discussed patient with the ER provider when     Subjective: Afebrile overnight    Assessment & Plan: Covid vaccination;   Principal Problem:   Femoral neck fracture ([place]) Active Problems:   Essential hypertension   Hyperlipidemia   RBBB   CKD (chronic kidney disease), stage IV (HCC)   Normocytic anemia   BPH (benign prostatic hyperplasia)  Abdominal mass   Scalp hematoma   Hypertensive urgency   Unilateral primary osteoarthritis, left hip   Bilateral hydronephrosis   LEFT femoral neck fracture  - s/p mechanical fall - CT hip shows acute nondisplaced fracture of left femoral neck and intertrochanteric region; severe OA left hip joint - seen by ortho; plans are for repair  12/8 - for now continue pain control  -12/8 patient is high risk surgically given his multiple medical problems.  In addition patient is noncompliant with medication, and describes himself as a herbalist/naturalist.   I had a long talk with patient and wife prior to patient being taken down for repair of his hip and explained that he had multiple conditions which would need to be addressed, HTN, holosystolic murmur, DM type II, abdominal mass, CKD stage IV with bilateral hydronephrosis.  Unfortunately although patient at high risk surgically currently if LEFT femoral fracture not fixed patient will become bedbound which will lead to additional medical problems.  Essential HTN -Amlodipine 10 mg daily -Hydralazine 25 mg PRN -Labetalol 10 mg PRN  New onset Holosystolic Murmur -Per patient and wife neither one knew of murmur  RBBB - saw Dr. Debara Pickett in 08/2014 and was offered a stress test due to new RBBB; patient declined and was to return in 3-4 months for follow up but did not do so - repeat EKG now: RBBB still present. No ischemia noted. No LVH - check TSH, A1c, Lipid -Echo pending  Hypertensive urgency - excessively elevated BP while patient at rest in bed in no obvious pain or discomfort; wife confirms elevated BP outpatient at times as well - patient not on meds - no neuro deficits at this time - unsure how much of his renal function is acute or chronic at this point without recent records but renal function has ben declined in past -Checked 1 troponin, negative  Abdominal mass - not pulsatile but u/k etiology at this time; patient nor wife are aware of significance or timeline of chronicity -abdominal ultrasound; see bilateral hydronephrosis  BPH (benign prostatic hyperplasia) - palpable bladder on exam, enlarged prostate on CT, and patient reports difficulty with voiding at home - check bladder scan, if >350 cc , place indwelling foley -Flomax 0.4 mg daily  CKD stage IV (baseline Cr~2.06) -multifactorial uncontrolled HTN, DM type II -12/2014 Cr 2.28 Lab Results  Component Value Date   CREATININE 2.12 (H) 03/08/2020   CREATININE 2.06 (H) 03/07/2020   CREATININE 1.77 (H) 04/23/2015    CREATININE 2.28 (H) 01/14/2015   CREATININE 1.16 07/11/2014  - creat in 12/2014 was 2.28 with GFR 25; currently creat is 2.06. Likely has stage IV due to untreated and uncontrolled HTN and/or DM -Patient with bilateral hydronephrosis on renal ultrasound  Bilateral Hydronephrosis -12/8 consulted urology will await their recommendations  Scalp hematoma - no intracranial process on CTH - no laceration or bleeding - continue supportive care  Normocytic anemia -Anemia panel pending -Fecal occult pending  HLD -Lipid panel pending      DVT prophylaxis: Held secondary to surgery Code Status: Full Family Communication: 12/8 wife at bedside for discussion of plan of care answered all questions Status is: Inpatient    Dispo: The patient is from: Home              Anticipated d/c is to: SNF?              Anticipated d/c date is: 12/13  Patient currently unstable      Consultants:  Orthopedic surgery   Procedures/Significant Events:  12/7 US abdomen complete;Post cholecystectomy.  -Medical renal disease changes of both kidneys with mild BILATERAL hydronephrosis of uncertain etiology.  Inadequate visualization of pancreas and aorta  I have personally reviewed and interpreted all radiology studies and my findings are as above.  VENTILATOR SETTINGS:    Cultures   Antimicrobials:    Devices    LINES / TUBES:      Continuous Infusions: . lactated ringers 700 mL/hr at 03/08/20 1705     Objective: Vitals:   03/08/20 1730 03/08/20 1745 03/08/20 1800 03/08/20 1817  BP: 108/77 (!) 124/56 (!) 121/50 (!) 122/56  Pulse: 94 90 88 89  Resp: 17 14 14 16   Temp:   (!) 97.5 F (36.4 C) 97.6 F (36.4 C)  TempSrc:    Oral  SpO2: 92% 94% 96% 97%  Weight:      Height:        Intake/Output Summary (Last 24 hours) at 03/08/2020 1904 Last data filed at 03/08/2020 1641 Gross per 24 hour  Intake 1526.55 ml  Output 2475 ml  Net -948.45 ml    Filed Weights   03/07/20 2123 03/08/20 1423  Weight: 62.8 kg 62.8 kg    Examination:  General: A/O x4, No acute respiratory distress Eyes: negative scleral hemorrhage, negative anisocoria, negative icterus ENT: Negative Runny nose, negative gingival bleeding, Neck:  Negative scars, masses, torticollis, lymphadenopathy, JVD Lungs: Clear to auscultation bilaterally without wheezes or crackles Cardiovascular: Regular rate and rhythm without murmur gallop or rub normal S1 and S2 Abdomen: negative abdominal pain, nondistended, positive soft, bowel sounds, no rebound, no ascites, no appreciable mass Extremities: LEFT hip fracture currently in Buck's traction Skin: Negative rashes, lesions, ulcers Psychiatric:  Negative depression, negative anxiety, negative fatigue, negative mania patient and wife have [place] understanding of his multiple medical problems Central nervous system:  Cranial nerves II through XII intact, tongue/uvula midline, all extremities muscle strength 5/5, sensation intact throughout, negative dysarthria, negative expressive aphasia, negative receptive aphasia.  .     Data Reviewed: Care during the described time interval was provided by me .  I have reviewed this patient's available data, including medical history, events of note, physical examination, and all test results as part of my evaluation.  CBC: Recent Labs  Lab 03/07/20 1156 03/08/20 0633  WBC 12.3* 7.2  NEUTROABS 10.6* 5.7  HGB 11.5* 9.5*  HCT 35.3* 29.1*  MCV 88.3 88.4  PLT 229 532   Basic Metabolic Panel: Recent Labs  Lab 03/07/20 1156 03/08/20 0633  NA 139 139  K 4.6 4.1  CL 109 110  CO2 19* 18*  GLUCOSE 164* 151*  BUN 38* 43*  CREATININE 2.06* 2.12*  CALCIUM 8.6* 7.8*  MG  --  2.1  PHOS  --  4.4   GFR: Estimated Creatinine Clearance: 22.6 mL/min (A) (by C-G formula based on SCr of 2.12 mg/dL (H)). Liver Function Tests: Recent Labs  Lab 03/07/20 1156  AST 23  ALT 15   ALKPHOS 92  BILITOT 0.6  PROT 7.8  ALBUMIN 4.2   No results for input(s): LIPASE, AMYLASE in the last 168 hours. No results for input(s): AMMONIA in the last 168 hours. Coagulation Profile: Recent Labs  Lab 03/07/20 1156  INR 1.1   Cardiac Enzymes: No results for input(s): CKTOTAL, CKMB, CKMBINDEX, TROPONINI in the last 168 hours. BNP (last 3 results) No results for input(s): PROBNP in the  last 8760 hours. HbA1C: Recent Labs    03/07/20 1700  HGBA1C 6.7*   CBG: Recent Labs  Lab 03/08/20 1429 03/08/20 1740  GLUCAP 117* 170*   Lipid Profile: Recent Labs    03/07/20 1156  CHOL 223*  HDL 45  LDLCALC 155*  TRIG 114  CHOLHDL 5.0   Thyroid Function Tests: Recent Labs    03/07/20 1156  TSH 0.278*   Anemia Panel: No results for input(s): VITAMINB12, FOLATE, FERRITIN, TIBC, IRON, RETICCTPCT in the last 72 hours. Sepsis Labs: No results for input(s): PROCALCITON, LATICACIDVEN in the last 168 hours.  Recent Results (from the past 240 hour(s))  Resp Panel by RT-PCR (Flu A&B, Covid) Nasopharyngeal Swab     Status: None   Collection Time: 03/07/20 11:57 AM   Specimen: Nasopharyngeal Swab; Nasopharyngeal(NP) swabs in vial transport medium  Result Value Ref Range Status   SARS Coronavirus 2 by RT PCR NEGATIVE NEGATIVE Final    Comment: (NOTE) SARS-CoV-2 target nucleic acids are NOT DETECTED.  The SARS-CoV-2 RNA is generally detectable in upper respiratory specimens during the acute phase of infection. The lowest concentration of SARS-CoV-2 viral copies this assay can detect is 138 copies/mL. A negative result does not preclude SARS-Cov-2 infection and should not be used as the sole basis for treatment or other patient management decisions. A negative result may occur with  improper specimen collection/handling, submission of specimen other than nasopharyngeal swab, presence of viral mutation(s) within the areas targeted by this assay, and inadequate number of  viral copies(<138 copies/mL). A negative result must be combined with clinical observations, patient history, and epidemiological information. The expected result is Negative.  Fact Sheet for Patients:  EntrepreneurPulse.com.au  Fact Sheet for Healthcare Providers:  IncredibleEmployment.be  This test is no t yet approved or cleared by the [place] FDA and  has been authorized for detection and/or diagnosis of SARS-CoV-2 by FDA under an Emergency Use Authorization (EUA). This EUA will remain  in effect (meaning this test can be used) for the duration of the COVID-19 declaration under Section 564(b)(1) of the Act, 21 U.S.C.section 360bbb-3(b)(1), unless the authorization is terminated  or revoked sooner.       Influenza A by PCR NEGATIVE NEGATIVE Final   Influenza B by PCR NEGATIVE NEGATIVE Final    Comment: (NOTE) The Xpert Xpress SARS-CoV-2/FLU/RSV plus assay is intended as an aid in the diagnosis of influenza from Nasopharyngeal swab specimens and should not be used as a sole basis for treatment. Nasal washings and aspirates are unacceptable for Xpert Xpress SARS-CoV-2/FLU/RSV testing.  Fact Sheet for Patients: EntrepreneurPulse.com.au  Fact Sheet for Healthcare Providers: IncredibleEmployment.be  This test is not yet approved or cleared by the [place] FDA and has been authorized for detection and/or diagnosis of SARS-CoV-2 by FDA under an Emergency Use Authorization (EUA). This EUA will remain in effect (meaning this test can be used) for the duration of the COVID-19 declaration under Section 564(b)(1) of the Act, 21 U.S.C. section 360bbb-3(b)(1), unless the authorization is terminated or revoked.  Performed at [facility], [place] [address], [place], [place] [address]   Surgical PCR screen     Status: None   Collection Time: 03/08/20  9:29 AM   Specimen: Nasal Mucosa;  Nasal Swab  Result Value Ref Range Status   MRSA, PCR NEGATIVE NEGATIVE Final   Staphylococcus aureus NEGATIVE NEGATIVE Final    Comment: (NOTE) The Xpert SA Assay (FDA approved for NASAL specimens in patients 22 years of  age and older), is one component of a comprehensive surveillance program. It is not intended to diagnose infection nor to guide or monitor treatment. Performed at [facility], [place] [address], [place], [place] [address]          Radiology Studies: DG Chest 1 View  Result Date: 03/07/2020 CLINICAL DATA:  Preop exam. EXAM: CHEST  1 VIEW COMPARISON:  Chest x-ray 4 06/20/1998. FINDINGS: Mediastinum and hilar structures normal. Heart size normal. Low lung volumes with mild left base subsegmental axis. No pleural effusion or pneumothorax. Degenerative change thoracic spine and both shoulders. IMPRESSION: Low lung volumes with mild left base subsegmental atelectasis. Electronically Signed   By: Marcello Moores  Register   On: 03/07/2020 13:07   DG Shoulder Right  Result Date: 03/07/2020 CLINICAL DATA:  Right shoulder pain after a fall last night. EXAM: RIGHT SHOULDER - 2+ VIEW COMPARISON:  None. FINDINGS: No acute fracture or dislocation is identified. Mild-to-moderate joint space narrowing and marginal spurring are noted at the acromioclavicular joint. Narrowing of the acromial humeral interval could indicate a rotator cuff tear. IMPRESSION: No acute osseous abnormality identified. Electronically Signed   By: Logan Bores M.D.   On: 03/07/2020 13:05   CT HEAD WO CONTRAST  Result Date: 03/07/2020 CLINICAL DATA:  Head trauma, minor (Age >= 65y) EXAM: CT HEAD WITHOUT CONTRAST TECHNIQUE: Contiguous axial images were obtained from the base of the skull through the vertex without intravenous contrast. COMPARISON:  04/23/2015 and prior FINDINGS: Brain: No acute infarct or intracranial hemorrhage. No mass lesion. No midline shift, ventriculomegaly or extra-axial fluid  collection. Mild cerebral atrophy with ex vacuo dilatation. Chronic microvascular ischemic changes. Remote right frontal insult. Vascular: No hyperdense vessel or unexpected calcification. Bilateral carotid siphon atherosclerotic calcifications. Skull: Negative for fracture or focal lesion. Sinuses/Orbits: No acute orbital finding. Pneumatized paranasal sinuses and mastoid air cells. Other: Right frontal scalp hematoma. IMPRESSION: 1. No acute intracranial process.  Remote right frontal insult. 2. Mild cerebral atrophy and chronic microvascular ischemic changes. 3. Right frontal scalp hematoma. Electronically Signed   By: Primitivo Gauze M.D.   On: 03/07/2020 14:49   US Abdomen Complete  Result Date: 03/07/2020 CLINICAL DATA:  Abdominal distension EXAM: ABDOMEN ULTRASOUND COMPLETE COMPARISON:  None FINDINGS: Gallbladder: Surgically absent Common bile duct: Diameter: 5 mm, normal Liver: Normal echogenicity without mass or nodularity. Portal vein is patent on color Doppler imaging with normal direction of blood flow towards the liver. IVC: Short segment of intrahepatic IVC normal appearance, remainder obscured by bowel gas Pancreas: Portion of head to and proximal tail normal appearance, remainder obscured by bowel gas Spleen: Normal appearance, 4.8 cm length Right Kidney: Length: 9.8 cm. Normal cortical thickness. Increased cortical echogenicity. No mass or shadowing calcification. Mild hydronephrosis. Left Kidney: Length: 9.9 cm. Normal cortical thickness. Increased cortical echogenicity. No mass or calcification. Mild hydronephrosis. Abdominal aorta: Small portion of mid abdominal aorta normal caliber, remainder obscured by bowel gas Other findings: No free fluid IMPRESSION: Post cholecystectomy. Medical renal disease changes of both kidneys with mild BILATERAL hydronephrosis of uncertain etiology. Inadequate visualization of pancreas and aorta. Electronically Signed   By: Lavonia Dana M.D.   On: 03/07/2020  18:25   CT HIP LEFT WO CONTRAST  Result Date: 03/07/2020 CLINICAL DATA:  Left hip pain after fall.  Abnormal x-ray. EXAM: CT OF THE LEFT HIP WITHOUT CONTRAST TECHNIQUE: Multidetector CT imaging of the left hip was performed according to the standard protocol. Multiplanar CT image reconstructions were also generated. COMPARISON:  X-ray 03/07/2020 FINDINGS: Bones/Joint/Cartilage Acute nondisplaced fracture of the left femoral neck and intertrochanteric region. No significant impaction or angulation. No fracture involvement of the femoral head. Severe osteoarthritis of the left hip joint with complete joint space loss, subchondral sclerosis/cystic change, and marginal osteophyte formation. Visualized portion of the left hemipelvis is intact without fracture. The inferior aspect of the left SI joint is intact without diastasis. Ligaments Suboptimally assessed by CT. Muscles and Tendons No acute musculotendinous abnormality by CT. Soft tissues There is induration within the subcutaneous soft tissues overlying the greater trochanter without organized soft tissue collection or hematoma. Visualized prostate gland is enlarged. Scattered vascular calcifications. IMPRESSION: 1. Acute nondisplaced fracture of the left femoral neck and intertrochanteric region. 2. 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 3. Enlarged prostate gland. Electronically Signed   By: Davina Poke D.O.   On: 03/07/2020 15:31   DG C-Arm 1-60 Min-No Report  Result Date: 03/08/2020 Fluoroscopy was utilized by the requesting physician.  No radiographic interpretation.   ECHOCARDIOGRAM COMPLETE  Result Date: 03/08/2020    ECHOCARDIOGRAM REPORT   Patient Name:   GEAROLD WAINER Date of Exam: 03/08/2020 Medical Rec #:  [id number]       Height:       66.0 in Accession #:    [id number]      Weight:       138.4 lb Date of Birth:  24-Dec-1932       BSA:          1.710 m Patient Age:    33 years        BP:           120/58 mmHg Patient Gender: M                HR:           92 bpm. Exam Location:  Inpatient Procedure: 2D Echo, Cardiac Doppler and Color Doppler Indications:    Murmur 785.2 / R01.1  History:        Patient has no prior history of Echocardiogram examinations.                 Risk Factors:Hypertension, Dyslipidemia and Former Smoker.  Sonographer:    Vickie Epley RDCS Referring Phys: [place]  1. Left ventricular ejection fraction, by estimation, is 55 to 60%. The left ventricle has normal function. The left ventricle has no regional wall motion abnormalities. There is moderate left ventricular hypertrophy. Left ventricular diastolic parameters are indeterminate.  2. Right ventricular systolic function is normal. The right ventricular size is normal. Tricuspid regurgitation signal is inadequate for assessing PA pressure.  3. The mitral valve is normal in structure. No evidence of mitral valve regurgitation.  4. Aortic dilatation noted. There is mild dilatation of the aortic root, measuring 40 mm.  5. The inferior vena cava is normal in size with greater than 50% respiratory variability, suggesting right atrial pressure of 3 mmHg.  6. The aortic valve is calcified. There is moderate calcification of the aortic valve. Aortic valve regurgitation is not visualized. Mild to moderate aortic valve stenosis. Vmax 2.2 m/s, MG 12 mmHg, AVA 1.5 cm^2, DI 0.33 FINDINGS  Left Ventricle: Left ventricular ejection fraction, by estimation, is 55 to 60%. The left ventricle has normal function. The left ventricle has no regional wall motion abnormalities. The left ventricular internal cavity size was normal in size. There is  moderate left ventricular hypertrophy. Left ventricular diastolic parameters are indeterminate. Right Ventricle:  The right ventricular size is normal. Right vetricular wall thickness was not assessed. Right ventricular systolic function is normal. Tricuspid regurgitation signal is inadequate for assessing PA pressure. Left  Atrium: Left atrial size was normal in size. Right Atrium: Right atrial size was normal in size. Pericardium: Trivial pericardial effusion is present. Mitral Valve: The mitral valve is normal in structure. No evidence of mitral valve regurgitation. Tricuspid Valve: The tricuspid valve is normal in structure. Tricuspid valve regurgitation is trivial. Aortic Valve: The aortic valve is calcified. There is moderate calcification of the aortic valve. Aortic valve regurgitation is not visualized. Mild to moderate aortic stenosis is present. Aortic valve mean gradient measures 12.0 mmHg. Aortic valve peak gradient measures 19.2 mmHg. Aortic valve area, by VTI measures 1.54 cm. Pulmonic Valve: The pulmonic valve was not well visualized. Pulmonic valve regurgitation is trivial. Aorta: Aortic dilatation noted. There is mild dilatation of the aortic root, measuring 40 mm. Venous: The inferior vena cava is normal in size with greater than 50% respiratory variability, suggesting right atrial pressure of 3 mmHg. IAS/Shunts: The interatrial septum was not well visualized.  LEFT VENTRICLE PLAX 2D LVIDd:         4.90 cm      Diastology LVIDs:         3.40 cm      LV e' medial:    5.48 cm/s LV PW:         0.90 cm      LV E/e' medial:  16.3 LV IVS:        0.90 cm      LV e' lateral:   7.50 cm/s LVOT diam:     2.40 cm      LV E/e' lateral: 11.9 LV SV:         70 LV SV Index:   41 LVOT Area:     4.52 cm  LV Volumes (MOD) LV vol d, MOD A2C: 84.8 ml LV vol d, MOD A4C: 102.0 ml LV vol s, MOD A2C: 37.0 ml LV vol s, MOD A4C: 47.8 ml LV SV MOD A2C:     47.8 ml LV SV MOD A4C:     102.0 ml LV SV MOD BP:      51.4 ml RIGHT VENTRICLE RV S prime:     13.70 cm/s TAPSE (M-mode): 1.6 cm LEFT ATRIUM           Index       RIGHT ATRIUM           Index LA diam:      2.30 cm 1.34 cm/m  RA Area:     11.10 cm LA Vol (A2C): 22.1 ml 12.92 ml/m RA Volume:   25.40 ml  14.85 ml/m LA Vol (A4C): 28.1 ml 16.43 ml/m  AORTIC VALVE AV Area (Vmax):    1.82 cm  AV Area (Vmean):   1.66 cm AV Area (VTI):     1.54 cm AV Vmax:           219.00 cm/s AV Vmean:          166.000 cm/s AV VTI:            0.451 m AV Peak Grad:      19.2 mmHg AV Mean Grad:      12.0 mmHg LVOT Vmax:         88.10 cm/s LVOT Vmean:        60.900 cm/s LVOT VTI:  0.154 m LVOT/AV VTI ratio: 0.34  AORTA Ao Root diam: 4.00 cm Ao Asc diam:  3.50 cm MITRAL VALVE MV Area (PHT): 3.39 cm     SHUNTS MV Decel Time: 224 msec     Systemic VTI:  0.15 m MV E velocity: 89.10 cm/s   Systemic Diam: 2.40 cm MV A velocity: 104.00 cm/s MV E/A ratio:  0.86 Oswaldo Milian MD Electronically signed by Oswaldo Milian MD Signature Date/Time: 03/08/2020/10:08:13 AM    Final    DG Hip Unilat With Pelvis 2-3 Views Left  Result Date: 03/07/2020 CLINICAL DATA:  Left hip pain after fall EXAM: DG HIP (WITH OR WITHOUT PELVIS) 2-3V LEFT COMPARISON:  None. FINDINGS: Cortical irregularity along the lateral aspect of the left femoral neck suspicious for a nondisplaced fracture. No displacement or angulation. Severe degenerative changes of the bilateral hip joints. Pelvic bony ring intact. Advanced vascular calcifications. IMPRESSION: Findings suspicious for a nondisplaced fracture of the left femoral neck. Electronically Signed   By: Davina Poke D.O.   On: 03/07/2020 13:04        Scheduled Meds: . amLODipine  10 mg Oral Daily  . [START ON 03/09/2020] aspirin EC  325 mg Oral Q breakfast  . Chlorhexidine Gluconate Cloth  6 each Topical Daily  . docusate sodium  100 mg Oral BID  . fentaNYL      . sodium chloride flush  3 mL Intravenous Q12H  . tamsulosin  0.4 mg Oral QPC supper   Continuous Infusions: . lactated ringers 700 mL/hr at 03/08/20 1705     LOS: 1 day    Time spent:40 min    Macario Shear, Geraldo Docker, MD [facility] Pager [phone]  If 7PM-7AM, please contact night-coverage [email or web address] Password [facility] 03/08/2020, 7:04 PM

## 2020-03-08 NOTE — Anesthesia Preprocedure Evaluation (Addendum)
Anesthesia Evaluation  Patient identified by MRN, date of birth, ID band Patient awake    Reviewed: Allergy & Precautions, NPO status , Patient's Chart, lab work & pertinent test results  Airway Mallampati: III  TM Distance: >3 FB Neck ROM: Full    Dental  (+) Missing, Chipped, Dental Advisory Given,    Pulmonary neg pulmonary ROS, former smoker,    Pulmonary exam normal breath sounds clear to auscultation       Cardiovascular hypertension, Pt. on home beta blockers and Pt. on medications Normal cardiovascular exam Rhythm:Regular Rate:Tachycardia  HLD RBBB  TTE 2021 Mild to mod AS, EF 55-60%   Neuro/Psych  Headaches, negative psych ROS   GI/Hepatic negative GI ROS, Neg liver ROS,   Endo/Other  diabetes (controlled with diet and exercise )  Renal/GU Renal InsufficiencyRenal disease (Cr 2.12, K 4.1)  negative genitourinary   Musculoskeletal negative musculoskeletal ROS (+)   Abdominal   Peds  Hematology  (+) Blood dyscrasia (Hgb 9.5), anemia ,   Anesthesia Other Findings   Reproductive/Obstetrics                          Anesthesia Physical Anesthesia Plan  ASA: III  Anesthesia Plan: General   Post-op Pain Management:    Induction: Intravenous  PONV Risk Score and Plan: 2 and Dexamethasone, Ondansetron and Treatment may vary due to age or medical condition  Airway Management Planned: Oral ETT  Additional Equipment:   Intra-op Plan:   Post-operative Plan: Extubation in OR  Informed Consent: I have reviewed the patients History and Physical, chart, labs and discussed the procedure including the risks, benefits and alternatives for the proposed anesthesia with the patient or authorized representative who has indicated his/her understanding and acceptance.     Dental advisory given  Plan Discussed with: CRNA  Anesthesia Plan Comments:        Anesthesia Quick  Evaluation

## 2020-03-09 ENCOUNTER — Encounter (HOSPITAL_COMMUNITY): Payer: Self-pay | Admitting: Orthopaedic Surgery

## 2020-03-09 ENCOUNTER — Inpatient Hospital Stay (HOSPITAL_COMMUNITY): Payer: Medicare Other

## 2020-03-09 DIAGNOSIS — N400 Enlarged prostate without lower urinary tract symptoms: Secondary | ICD-10-CM

## 2020-03-09 LAB — LIPID PANEL
Cholesterol: 177 mg/dL (ref 0–200)
HDL: 37 mg/dL — ABNORMAL LOW (ref >40)
LDL Cholesterol: 115 mg/dL — ABNORMAL HIGH (ref 0–99)
Total CHOL/HDL Ratio: 4.8 RATIO
Triglycerides: 124 mg/dL (ref <150)
VLDL: 25 mg/dL (ref 0–40)

## 2020-03-09 LAB — RETICULOCYTES
Immature Retic Fract: 11.2 % (ref 2.3–15.9)
RBC.: 3.35 MIL/uL — ABNORMAL LOW (ref 4.22–5.81)
Retic Count, Absolute: 46.6 10*3/uL (ref 19.0–186.0)
Retic Ct Pct: 1.4 % (ref 0.4–3.1)

## 2020-03-09 LAB — CBC WITH DIFFERENTIAL/PLATELET
Abs Immature Granulocytes: 0.04 10*3/uL (ref 0.00–0.07)
Basophils Absolute: 0 10*3/uL (ref 0.0–0.1)
Basophils Relative: 0 %
Eosinophils Absolute: 0 10*3/uL (ref 0.0–0.5)
Eosinophils Relative: 0 %
HCT: 29.2 % — ABNORMAL LOW (ref 39.0–52.0)
Hemoglobin: 9.4 g/dL — ABNORMAL LOW (ref 13.0–17.0)
Immature Granulocytes: 0 %
Lymphocytes Relative: 3 %
Lymphs Abs: 0.3 10*3/uL — ABNORMAL LOW (ref 0.7–4.0)
MCH: 28.7 pg (ref 26.0–34.0)
MCHC: 32.2 g/dL (ref 30.0–36.0)
MCV: 89 fL (ref 80.0–100.0)
Monocytes Absolute: 0.5 10*3/uL (ref 0.1–1.0)
Monocytes Relative: 5 %
Neutro Abs: 8.4 10*3/uL — ABNORMAL HIGH (ref 1.7–7.7)
Neutrophils Relative %: 92 %
Platelets: 164 10*3/uL (ref 150–400)
RBC: 3.28 MIL/uL — ABNORMAL LOW (ref 4.22–5.81)
RDW: 13 % (ref 11.5–15.5)
WBC: 9.1 10*3/uL (ref 4.0–10.5)
nRBC: 0 % (ref 0.0–0.2)

## 2020-03-09 LAB — COMPREHENSIVE METABOLIC PANEL
ALT: 14 U/L (ref 0–44)
AST: 27 U/L (ref 15–41)
Albumin: 3.6 g/dL (ref 3.5–5.0)
Alkaline Phosphatase: 64 U/L (ref 38–126)
Anion gap: 12 (ref 5–15)
BUN: 47 mg/dL — ABNORMAL HIGH (ref 8–23)
CO2: 19 mmol/L — ABNORMAL LOW (ref 22–32)
Calcium: 8 mg/dL — ABNORMAL LOW (ref 8.9–10.3)
Chloride: 110 mmol/L (ref 98–111)
Creatinine, Ser: 1.96 mg/dL — ABNORMAL HIGH (ref 0.61–1.24)
GFR, Estimated: 33 mL/min — ABNORMAL LOW (ref >60)
Glucose, Bld: 210 mg/dL — ABNORMAL HIGH (ref 70–99)
Potassium: 4.7 mmol/L (ref 3.5–5.1)
Sodium: 141 mmol/L (ref 135–145)
Total Bilirubin: 0.7 mg/dL (ref 0.3–1.2)
Total Protein: 6.8 g/dL (ref 6.5–8.1)

## 2020-03-09 LAB — FERRITIN: Ferritin: 77 ng/mL (ref 24–336)

## 2020-03-09 LAB — GLUCOSE, CAPILLARY: Glucose-Capillary: 160 mg/dL — ABNORMAL HIGH (ref 70–99)

## 2020-03-09 LAB — IRON AND TIBC
Iron: 19 ug/dL — ABNORMAL LOW (ref 45–182)
Saturation Ratios: 8 % — ABNORMAL LOW (ref 17.9–39.5)
TIBC: 237 ug/dL — ABNORMAL LOW (ref 250–450)
UIBC: 218 ug/dL

## 2020-03-09 LAB — MAGNESIUM: Magnesium: 2.1 mg/dL (ref 1.7–2.4)

## 2020-03-09 LAB — VITAMIN B12: Vitamin B-12: 186 pg/mL (ref 180–914)

## 2020-03-09 LAB — FOLATE: Folate: 13.3 ng/mL (ref >5.9)

## 2020-03-09 LAB — LACTATE DEHYDROGENASE: LDH: 211 U/L — ABNORMAL HIGH (ref 98–192)

## 2020-03-09 MED ORDER — INSULIN ASPART 100 UNIT/ML ~~LOC~~ SOLN
0.0000 [IU] | SUBCUTANEOUS | Status: DC
  Administered 2020-03-09: 3 [IU] via SUBCUTANEOUS

## 2020-03-09 NOTE — NC FL2 (Signed)
[place] LEVEL OF CARE SCREENING TOOL     IDENTIFICATION  Patient Name: John Walton Birthdate: [birth year] Sex: male Admission Date (Current Location): 03/07/2020  [employer] and [place] Number:  Herbalist and Address:  [facility],  [address] [place], [place]      Provider Number: [id number]  Attending Physician Name and Address:  Allie Bossier, MD  Relative Name and Phone Number:  Gershon Cull [phone]    Current Level of Care: Hospital Recommended Level of Care: [place] Prior Approval Number:    Date Approved/Denied:   PASRR Number: [id number]  Discharge Plan: SNF    Current Diagnoses: Patient Active Problem List   Diagnosis Date Noted  . Bilateral hydronephrosis 03/08/2020  . Unilateral primary osteoarthritis, left hip   . Femoral neck fracture ([place]) 03/07/2020  . CKD (chronic kidney disease), stage IV ([place]) 03/07/2020  . Normocytic anemia 03/07/2020  . BPH (benign prostatic hyperplasia) 03/07/2020  . Abdominal mass 03/07/2020  . Scalp hematoma 03/07/2020  . Hypertensive urgency 03/07/2020  . Pain due to onychomycosis of toenails of both feet 08/13/2019  . RBBB 09/02/2014  . Screening, ischemic heart disease 07/11/2014  . Medicare annual wellness visit, subsequent 07/11/2014  . Prostate cancer screening 07/11/2014  . Essential hypertension 01/11/2014  . Hyperlipidemia 01/11/2014    Orientation RESPIRATION BLADDER Height & Weight     Self,Time,Situation,Place  Normal Continent Weight: 138 lb 7.2 oz (62.8 kg) Height:  5\' 6"  (167.6 cm)  BEHAVIORAL SYMPTOMS/MOOD NEUROLOGICAL BOWEL NUTRITION STATUS      Continent  (Carb Modified)  AMBULATORY STATUS COMMUNICATION OF NEEDS Skin   Extensive Assist Verbally  (Incision Left Hip)                       Personal Care Assistance Level of Assistance  Bathing,Dressing,Feeding Bathing Assistance: Limited assistance Feeding assistance:  Independent Dressing Assistance: Maximum assistance     Functional Limitations Info  Sight,Hearing,Speech Sight Info: Adequate Hearing Info: Impaired Speech Info: Adequate    SPECIAL CARE FACTORS FREQUENCY  PT (By licensed PT),OT (By licensed OT)     PT Frequency: 5X/week OT Frequency: 5x/week            Contractures Contractures Info: Not present    Additional Factors Info  Code Status,Allergies Code Status Info: Fullcode Allergies Info: Allergies: No Known Allergies           Current Medications (03/09/2020):  This is the current hospital active medication list Current Facility-Administered Medications  Medication Dose Route Frequency Provider Last Rate Last Admin  . acetaminophen (TYLENOL) tablet 650 mg  650 mg Oral Q6H PRN Dwyane Dee, MD       Or  . acetaminophen (TYLENOL) suppository 650 mg  650 mg Rectal Q6H PRN Dwyane Dee, MD      . amLODipine (NORVASC) tablet 10 mg  10 mg Oral Daily Dwyane Dee, MD   10 mg at 03/09/20 0820  . aspirin EC tablet 325 mg  325 mg Oral Q breakfast Lanae Crumbly, PA-C   325 mg at 03/09/20 8032  . Chlorhexidine Gluconate Cloth 2 % PADS 6 each  6 each Topical Daily Allie Bossier, MD   6 each at 03/09/20 1300  . docusate sodium (COLACE) capsule 100 mg  100 mg Oral BID Lanae Crumbly, PA-C   100 mg at 03/09/20 1224  . hydrALAZINE (APRESOLINE) tablet 25 mg  25 mg Oral Q4H PRN Girguis,  Shanon Brow, MD      . HYDROmorphone (DILAUDID) injection 1 mg  1 mg Intravenous Q4H PRN Garald Balding, MD   1 mg at 03/08/20 1904  . labetalol (NORMODYNE) injection 10 mg  10 mg Intravenous Q4H PRN Dwyane Dee, MD   10 mg at 03/07/20 2237  . lactated ringers infusion   Intravenous Continuous Dwyane Dee, MD 75 mL/hr at 03/09/20 0506 Infusion Verify at 03/09/20 0506  . menthol-cetylpyridinium (CEPACOL) lozenge 3 mg  1 lozenge Oral PRN Lanae Crumbly, PA-C       Or  . phenol (CHLORASEPTIC) mouth spray 1 spray  1 spray Mouth/Throat PRN Lanae Crumbly, PA-C      . metoCLOPramide (REGLAN) tablet 5-10 mg  5-10 mg Oral Q8H PRN Lanae Crumbly, PA-C       Or  . metoCLOPramide (REGLAN) injection 5-10 mg  5-10 mg Intravenous Q8H PRN Benjiman Core M, PA-C      . ondansetron [facility]) tablet 4 mg  4 mg Oral Q6H PRN Lanae Crumbly, PA-C       Or  . ondansetron [facility]) injection 4 mg  4 mg Intravenous Q6H PRN Lanae Crumbly, PA-C      . oxyCODONE (Oxy IR/ROXICODONE) immediate release tablet 5 mg  5 mg Oral Q4H PRN Dwyane Dee, MD   5 mg at 03/09/20 8938  . senna-docusate (Senokot-S) tablet 1 tablet  1 tablet Oral BID PRN Dwyane Dee, MD      . sodium chloride flush (NS) 0.9 % injection 3 mL  3 mL Intravenous Q12H Dwyane Dee, MD   3 mL at 03/09/20 1301  . tamsulosin (FLOMAX) capsule 0.4 mg  0.4 mg Oral QPC supper Dwyane Dee, MD   0.4 mg at 03/07/20 1820     Discharge Medications: Please see discharge summary for a list of discharge medications.  Relevant Imaging Results:  Relevant Lab Results:   Additional Information [id number]  Lia Hopping, LCSW

## 2020-03-09 NOTE — Progress Notes (Signed)
Subjective: 1 Day Post-Op Procedure(s) (LRB): TOTAL HIP ARTHROPLASTY (Left) Patient reports pain as mild and moderate.    Objective: Vital signs in last 24 hours: Temp:  [97.5 F (36.4 C)-98.8 F (37.1 C)] 97.7 F (36.5 C) (12/09 0634) Pulse Rate:  [86-106] 96 (12/09 0634) Resp:  [14-18] 16 (12/09 0634) BP: (108-139)/(50-77) 136/67 (12/09 0634) SpO2:  [92 %-99 %] 99 % (12/09 0634) Weight:  [62.8 kg] 62.8 kg (12/08 1423)  Intake/Output from previous day: 12/08 0701 - 12/09 0700 In: 1132.1 [P.O.:50; I.V.:832.1; IV Piggyback:250] Out: 1900 [Urine:1550; Blood:350] Intake/Output this shift: No intake/output data recorded.  Recent Labs    03/07/20 1156 03/08/20 0633 03/09/20 0338  HGB 11.5* 9.5* 9.4*   Recent Labs    03/08/20 0633 03/09/20 0338  WBC 7.2 9.1  RBC 3.29* 3.28*  3.35*  HCT 29.1* 29.2*  PLT 177 164   Recent Labs    03/08/20 0633 03/09/20 0338  NA 139 141  K 4.1 4.7  CL 110 110  CO2 18* 19*  BUN 43* 47*  CREATININE 2.12* 1.96*  GLUCOSE 151* 210*  CALCIUM 7.8* 8.0*   Recent Labs    03/07/20 1156  INR 1.1    Neurologically intact DG C-Arm 1-60 Min-No Report  Result Date: 03/08/2020 Fluoroscopy was utilized by the requesting physician.  No radiographic interpretation.   ECHOCARDIOGRAM COMPLETE  Result Date: 03/08/2020    ECHOCARDIOGRAM REPORT   Patient Name:   John Walton Date of Exam: 03/08/2020 Medical Rec #:  [id number]       Height:       66.0 in Accession #:    [id number]      Weight:       138.4 lb Date of Birth:  [birth year]       BSA:          1.710 m Patient Age:    86 years        BP:           120/58 mmHg Patient Gender: M               HR:           92 bpm. Exam Location:  Inpatient Procedure: 2D Echo, Cardiac Doppler and Color Doppler Indications:    Murmur 785.2 / R01.1  History:        Patient has no prior history of Echocardiogram examinations.                 Risk Factors:Hypertension, Dyslipidemia and Former Smoker.   Sonographer:    Vickie Epley RDCS Referring Phys: [place]  1. Left ventricular ejection fraction, by estimation, is 55 to 60%. The left ventricle has normal function. The left ventricle has no regional wall motion abnormalities. There is moderate left ventricular hypertrophy. Left ventricular diastolic parameters are indeterminate.  2. Right ventricular systolic function is normal. The right ventricular size is normal. Tricuspid regurgitation signal is inadequate for assessing PA pressure.  3. The mitral valve is normal in structure. No evidence of mitral valve regurgitation.  4. Aortic dilatation noted. There is mild dilatation of the aortic root, measuring 40 mm.  5. The inferior vena cava is normal in size with greater than 50% respiratory variability, suggesting right atrial pressure of 3 mmHg.  6. The aortic valve is calcified. There is moderate calcification of the aortic valve. Aortic valve regurgitation is not visualized. Mild to moderate aortic valve stenosis. Vmax 2.2 m/s, MG 12 mmHg,  AVA 1.5 cm^2, DI 0.33 FINDINGS  Left Ventricle: Left ventricular ejection fraction, by estimation, is 55 to 60%. The left ventricle has normal function. The left ventricle has no regional wall motion abnormalities. The left ventricular internal cavity size was normal in size. There is  moderate left ventricular hypertrophy. Left ventricular diastolic parameters are indeterminate. Right Ventricle: The right ventricular size is normal. Right vetricular wall thickness was not assessed. Right ventricular systolic function is normal. Tricuspid regurgitation signal is inadequate for assessing PA pressure. Left Atrium: Left atrial size was normal in size. Right Atrium: Right atrial size was normal in size. Pericardium: Trivial pericardial effusion is present. Mitral Valve: The mitral valve is normal in structure. No evidence of mitral valve regurgitation. Tricuspid Valve: The tricuspid valve is normal in  structure. Tricuspid valve regurgitation is trivial. Aortic Valve: The aortic valve is calcified. There is moderate calcification of the aortic valve. Aortic valve regurgitation is not visualized. Mild to moderate aortic stenosis is present. Aortic valve mean gradient measures 12.0 mmHg. Aortic valve peak gradient measures 19.2 mmHg. Aortic valve area, by VTI measures 1.54 cm. Pulmonic Valve: The pulmonic valve was not well visualized. Pulmonic valve regurgitation is trivial. Aorta: Aortic dilatation noted. There is mild dilatation of the aortic root, measuring 40 mm. Venous: The inferior vena cava is normal in size with greater than 50% respiratory variability, suggesting right atrial pressure of 3 mmHg. IAS/Shunts: The interatrial septum was not well visualized.  LEFT VENTRICLE PLAX 2D LVIDd:         4.90 cm      Diastology LVIDs:         3.40 cm      LV e' medial:    5.48 cm/s LV PW:         0.90 cm      LV E/e' medial:  16.3 LV IVS:        0.90 cm      LV e' lateral:   7.50 cm/s LVOT diam:     2.40 cm      LV E/e' lateral: 11.9 LV SV:         70 LV SV Index:   41 LVOT Area:     4.52 cm  LV Volumes (MOD) LV vol d, MOD A2C: 84.8 ml LV vol d, MOD A4C: 102.0 ml LV vol s, MOD A2C: 37.0 ml LV vol s, MOD A4C: 47.8 ml LV SV MOD A2C:     47.8 ml LV SV MOD A4C:     102.0 ml LV SV MOD BP:      51.4 ml RIGHT VENTRICLE RV S prime:     13.70 cm/s TAPSE (M-mode): 1.6 cm LEFT ATRIUM           Index       RIGHT ATRIUM           Index LA diam:      2.30 cm 1.34 cm/m  RA Area:     11.10 cm LA Vol (A2C): 22.1 ml 12.92 ml/m RA Volume:   25.40 ml  14.85 ml/m LA Vol (A4C): 28.1 ml 16.43 ml/m  AORTIC VALVE AV Area (Vmax):    1.82 cm AV Area (Vmean):   1.66 cm AV Area (VTI):     1.54 cm AV Vmax:           219.00 cm/s AV Vmean:          166.000 cm/s AV VTI:  0.451 m AV Peak Grad:      19.2 mmHg AV Mean Grad:      12.0 mmHg LVOT Vmax:         88.10 cm/s LVOT Vmean:        60.900 cm/s LVOT VTI:          0.154 m LVOT/AV  VTI ratio: 0.34  AORTA Ao Root diam: 4.00 cm Ao Asc diam:  3.50 cm MITRAL VALVE MV Area (PHT): 3.39 cm     SHUNTS MV Decel Time: 224 msec     Systemic VTI:  0.15 m MV E velocity: 89.10 cm/s   Systemic Diam: 2.40 cm MV A velocity: 104.00 cm/s MV E/A ratio:  0.86 Oswaldo Milian MD Electronically signed by Oswaldo Milian MD Signature Date/Time: 03/08/2020/10:08:13 AM    Final    DG Hip Port Unilat With Pelvis 1V Left  Result Date: 03/08/2020 CLINICAL DATA:  86 year old male undergoing hip arthroplasty after traumatic fracture. EXAM: DG HIP (WITH OR WITHOUT PELVIS) 1V PORT LEFT COMPARISON:  Intraoperative images 16 35 hours today. FINDINGS: Portable AP view at 1715 hours. Left hip bipolar arthroplasty demonstrated on this slightly oblique view. Hardware appears intact and normally aligned. Regional postoperative soft tissue gas. Overlying skin staples. No unexpected osseous changes. IMPRESSION: Mildly oblique view of bipolar left hip arthroplasty with no adverse features. Electronically Signed   By: Genevie Ann M.D.   On: 03/08/2020 19:32   DG HIP OPERATIVE UNILAT W OR W/O PELVIS LEFT  Result Date: 03/08/2020 CLINICAL DATA:  86 year old male undergoing hip arthroplasty after traumatic fracture. EXAM: OPERATIVE LEFT HIP (WITH PELVIS IF PERFORMED) 2 VIEWS TECHNIQUE: Fluoroscopic spot image(s) were submitted for interpretation post-operatively. COMPARISON:  LEFT HIP CT 03/07/2020. FINDINGS: Two intraoperative fluoroscopic AP spot views of the left hip demonstrate bipolar arthroplasty underway. No unexpected osseous changes are visible. FLUOROSCOPY TIME:  0 minutes 2 seconds IMPRESSION: Left hip arthroplasty underway. Electronically Signed   By: Genevie Ann M.D.   On: 03/08/2020 19:31    Assessment/Plan: 1 Day Post-Op Procedure(s) (LRB): TOTAL HIP ARTHROPLASTY (Left) Up with therapy.  Posterior hip precautions. May remove KI with therapy.   John Walton 03/09/2020, 7:21 AM

## 2020-03-09 NOTE — Plan of Care (Signed)

## 2020-03-09 NOTE — Progress Notes (Addendum)
PROGRESS NOTE    John Walton  VFI:[id number] DOB: [birth year] DOA: 03/07/2020 PCP: [name], MD     Brief Narrative:  86 yo WM PMHx HTN, HLD, glaucoma, RBBB, DMII, CKD Stage 4, noncompliance to meds   Presented after a mechanical fall at home.  Patient is accompanied by his wife in the ER who helps provide further collateral information.  He was going up the stairs and states that he missed a step and fell onto the floor.  This happened around 10 PM on Monday evening.  It took several hours for his wife and another family member to get him in the chair and back to bed.  Due to his ongoing left hip pain he finally presented for further evaluation.  He follows outpatient with a PCP per his wife, [name].  He also follows with the [place], [name]. No records are available for review.  Only past records in epic are from 2016.  He had a right bundle branch block at that time and was evaluated by cardiology with recommendations for undergoing a stress test at that time, however he declined and has not presented back since.  He also endorses that he does not take any routine medications at home.  He has lived a natural lifestyle and only uses a herbal substance to help with the neuropathic pains he is describing in his feet. He states that he controlled his diabetes with diet and exercise.  We talked about his uncontrolled blood pressure in the ER; he does endorse some pain however his wife states that his blood pressure has been elevated at office visits as well.  He does not take any medications for his blood pressure. During exam he was noted to have a rather large and noticeable abdominal mass.  It was not pulsating but when asked what it was, the patient could not elaborate and it appears has not been evaluated or at least they cannot tell me so. We also discussed his renal function.  They are aware that it is decreased but also could not further elaborate.  He does endorse that  he has difficulty voiding oftentimes and can take up to 30 minutes to initiate voiding.  On work-up in the ER regarding his fall he underwent imaging.  His wife does note that he had a small cyst on his right forehead that they knew about but after his fall and hitting his head he did have an enlarged amount of swelling on his right forehead. CT head was negative for intracranial process but did show a right frontal scalp hematoma.  He has a remote right frontal infarct noted as well.  Mild cerebral atrophy.  Right shoulder x-ray negative for acute abnormality.  CXR unremarkable, some atelectasis. CT left hip shows acute nondisplaced fracture of left femoral neck and intertrochanteric region.  Severe osteoarthritis of left hip joint. Also showed enlarged prostate gland.  He was evaluated by orthopedic surgery with tentative plans for operative repair and is admitted for further work-up and treatment.   I have personally briefly reviewed patient's old medical records in [facility] and discussed patient with the ER provider when     Subjective: 12/9 afebrile overnight A/O x4, negative CP, negative nausea.  Negative vomiting.  Positive LEFT leg pain appropriate for surgery on 12/8    Assessment & Plan: Covid vaccination;   Principal Problem:   Femoral neck fracture ([place]) Active Problems:   Essential hypertension   Hyperlipidemia  RBBB   CKD (chronic kidney disease), stage IV (HCC)   Normocytic anemia   BPH (benign prostatic hyperplasia)   Abdominal mass   Scalp hematoma   Hypertensive urgency   Unilateral primary osteoarthritis, left hip   Bilateral hydronephrosis   LEFT femoral neck fracture  - s/p mechanical fall - CT hip shows acute nondisplaced fracture of left femoral neck and intertrochanteric region; severe OA left hip joint - seen by ortho; plans are for repair  12/8 - for now continue pain control  -12/8 patient is high risk surgically given his multiple  medical problems.  In addition patient is noncompliant with medication, and describes himself as a herbalist/naturalist.  I had a long talk with patient and wife prior to patient being taken down for repair of his hip and explained that he had multiple conditions which would need to be addressed, HTN, holosystolic murmur, DM type II, abdominal mass, CKD stage IV with bilateral hydronephrosis.  Unfortunately although patient at high risk surgically currently if LEFT femoral fracture not fixed patient will become bedbound which will lead to additional medical problems.  Essential HTN/HTN urgency -Amlodipine 10 mg daily -Hydralazine 25 mg PRN -Labetalol 10 mg PRN  New onset Holosystolic Murmur -Per patient and wife neither one knew of murmur  RBBB - saw Dr. Debara Pickett in 08/2014 and was offered a stress test due to new RBBB; patient declined and was to return in 3-4 months for follow up but did not do so - repeat EKG now: RBBB still present. No ischemia noted. No LVH - check TSH, A1c, Lipid -Echo pending   Abdominal mass - not pulsatile but u/k etiology at this time; patient nor wife are aware of significance or timeline of chronicity -abdominal ultrasound; see bilateral hydronephrosis  DM type II controlled with Hyperglycemia -12/7 Hemoglobin A1c= 6.7  -Lipid panel pending -12/9 Moderate SSI   BPH (benign prostatic hyperplasia) - palpable bladder on exam, enlarged prostate on CT, and patient reports difficulty with voiding at home - check bladder scan, if >350 cc , place indwelling foley -Flomax 0.4 mg daily  CKD stage IV (baseline Cr~2.06) -multifactorial uncontrolled HTN, DM type II -12/2014 Cr 2.28 Lab Results  Component Value Date   CREATININE 1.96 (H) 03/09/2020   CREATININE 2.12 (H) 03/08/2020   CREATININE 2.06 (H) 03/07/2020   CREATININE 1.77 (H) 04/23/2015   CREATININE 2.28 (H) 01/14/2015  - creat in 12/2014 was 2.28 with GFR 25; currently creat is 2.06. Likely has stage  IV due to untreated and uncontrolled HTN and/or DM -Patient with bilateral hydronephrosis on renal ultrasound  Bilateral Hydronephrosis -12/8 consulted urology will await their recommendations  Scalp hematoma - no intracranial process on CTH - no laceration or bleeding - continue supportive care  Normocytic anemia -Anemia panel pending -Fecal occult pending  HLD -Lipid panel pending      DVT prophylaxis: Held secondary to surgery Code Status: Full Family Communication: 12/9 wife at bedside for discussion of plan of care answered all questions Status is: Inpatient    Dispo: The patient is from: Home              Anticipated d/c is to: SNF?              Anticipated d/c date is: 12/13              Patient currently unstable      Consultants:  Orthopedic surgery   Procedures/Significant Events:  12/7 US abdomen complete;Post cholecystectomy.  -Medical  renal disease changes of both kidneys with mild BILATERAL hydronephrosis of uncertain etiology.  Inadequate visualization of pancreas and aorta 12/9 Echocardiogram;LVEF =55 to 60%.  -moderate LVH Left ventricular diastolic parameters are indeterminate.  Aortic Valve: Mild to moderate aortic stenosis is present.    I have personally reviewed and interpreted all radiology studies and my findings are as above.  VENTILATOR SETTINGS:    Cultures   Antimicrobials: Anti-infectives (From admission, onward)   Start     Dose/Rate Route Frequency Ordered Stop   03/08/20 0900  ceFAZolin (ANCEF) IVPB 2g/100 mL premix        2 g 200 mL/hr over 30 Minutes Intravenous On call to O.R. 03/08/20 0813 03/08/20 1505       Devices    LINES / TUBES:      Continuous Infusions: . lactated ringers 75 mL/hr at 03/09/20 0506     Objective: Vitals:   03/09/20 0148 03/09/20 0634 03/09/20 0954 03/09/20 1416  BP: 139/68 136/67 130/63 (!) 136/58  Pulse: 96 96 97 97  Resp: 15 16 20 20   Temp: 98 F (36.7 C)  97.7 F (36.5 C) 97.8 F (36.6 C) 98.1 F (36.7 C)  TempSrc: Axillary Oral  Oral  SpO2: 99% 99% 99% 93%  Weight:      Height:        Intake/Output Summary (Last 24 hours) at 03/09/2020 1528 Last data filed at 03/09/2020 0800 Gross per 24 hour  Intake 910 ml  Output 1350 ml  Net -440 ml   Filed Weights   03/07/20 2123 03/08/20 1423  Weight: 62.8 kg 62.8 kg    Examination:  General: A/O x4, No acute respiratory distress Eyes: negative scleral hemorrhage, negative anisocoria, negative icterus ENT: Negative Runny nose, negative gingival bleeding, Neck:  Negative scars, masses, torticollis, lymphadenopathy, JVD Lungs: Clear to auscultation bilaterally without wheezes or crackles Cardiovascular: Regular rate and rhythm without murmur gallop or rub normal S1 and S2 Abdomen: negative abdominal pain, nondistended, positive soft, bowel sounds, no rebound, no ascites, no appreciable mass Extremities: LEFT hip fracture currently in Buck's traction Skin: Negative rashes, lesions, ulcers Psychiatric:  Negative depression, negative anxiety, negative fatigue, negative mania patient and wife have [place] understanding of his multiple medical problems Central nervous system:  Cranial nerves II through XII intact, tongue/uvula midline, all extremities muscle strength 5/5, sensation intact throughout, negative dysarthria, negative expressive aphasia, negative receptive aphasia.  .     Data Reviewed: Care during the described time interval was provided by me .  I have reviewed this patient's available data, including medical history, events of note, physical examination, and all test results as part of my evaluation.  CBC: Recent Labs  Lab 03/07/20 1156 03/08/20 0633 03/09/20 0338  WBC 12.3* 7.2 9.1  NEUTROABS 10.6* 5.7 8.4*  HGB 11.5* 9.5* 9.4*  HCT 35.3* 29.1* 29.2*  MCV 88.3 88.4 89.0  PLT 229 177 440   Basic Metabolic Panel: Recent Labs  Lab 03/07/20 1156 03/08/20 0633  03/09/20 0338  NA 139 139 141  K 4.6 4.1 4.7  CL 109 110 110  CO2 19* 18* 19*  GLUCOSE 164* 151* 210*  BUN 38* 43* 47*  CREATININE 2.06* 2.12* 1.96*  CALCIUM 8.6* 7.8* 8.0*  MG  --  2.1 2.1  PHOS  --  4.4  --    GFR: Estimated Creatinine Clearance: 24.5 mL/min (A) (by C-G formula based on SCr of 1.96 mg/dL (H)). Liver Function Tests: Recent Labs  Lab 03/07/20 1156  03/09/20 0338  AST 23 27  ALT 15 14  ALKPHOS 92 64  BILITOT 0.6 0.7  PROT 7.8 6.8  ALBUMIN 4.2 3.6   No results for input(s): LIPASE, AMYLASE in the last 168 hours. No results for input(s): AMMONIA in the last 168 hours. Coagulation Profile: Recent Labs  Lab 03/07/20 1156  INR 1.1   Cardiac Enzymes: No results for input(s): CKTOTAL, CKMB, CKMBINDEX, TROPONINI in the last 168 hours. BNP (last 3 results) No results for input(s): PROBNP in the last 8760 hours. HbA1C: Recent Labs    03/07/20 1700  HGBA1C 6.7*   CBG: Recent Labs  Lab 03/08/20 1429 03/08/20 1740  GLUCAP 117* 170*   Lipid Profile: Recent Labs    03/07/20 1156 03/09/20 0338  CHOL 223* 177  HDL 45 37*  LDLCALC 155* 115*  TRIG 114 124  CHOLHDL 5.0 4.8   Thyroid Function Tests: Recent Labs    03/07/20 1156  TSH 0.278*   Anemia Panel: Recent Labs    03/09/20 0338  VITAMINB12 186  FOLATE 13.3  FERRITIN 77  TIBC 237*  IRON 19*  RETICCTPCT 1.4   Sepsis Labs: No results for input(s): PROCALCITON, LATICACIDVEN in the last 168 hours.  Recent Results (from the past 240 hour(s))  Resp Panel by RT-PCR (Flu A&B, Covid) Nasopharyngeal Swab     Status: None   Collection Time: 03/07/20 11:57 AM   Specimen: Nasopharyngeal Swab; Nasopharyngeal(NP) swabs in vial transport medium  Result Value Ref Range Status   SARS Coronavirus 2 by RT PCR NEGATIVE NEGATIVE Final    Comment: (NOTE) SARS-CoV-2 target nucleic acids are NOT DETECTED.  The SARS-CoV-2 RNA is generally detectable in upper respiratory specimens during the acute  phase of infection. The lowest concentration of SARS-CoV-2 viral copies this assay can detect is 138 copies/mL. A negative result does not preclude SARS-Cov-2 infection and should not be used as the sole basis for treatment or other patient management decisions. A negative result may occur with  improper specimen collection/handling, submission of specimen other than nasopharyngeal swab, presence of viral mutation(s) within the areas targeted by this assay, and inadequate number of viral copies(<138 copies/mL). A negative result must be combined with clinical observations, patient history, and epidemiological information. The expected result is Negative.  Fact Sheet for Patients:  EntrepreneurPulse.com.au  Fact Sheet for Healthcare Providers:  IncredibleEmployment.be  This test is no t yet approved or cleared by the [place] FDA and  has been authorized for detection and/or diagnosis of SARS-CoV-2 by FDA under an Emergency Use Authorization (EUA). This EUA will remain  in effect (meaning this test can be used) for the duration of the COVID-19 declaration under Section 564(b)(1) of the Act, 21 U.S.C.section 360bbb-3(b)(1), unless the authorization is terminated  or revoked sooner.       Influenza A by PCR NEGATIVE NEGATIVE Final   Influenza B by PCR NEGATIVE NEGATIVE Final    Comment: (NOTE) The Xpert Xpress SARS-CoV-2/FLU/RSV plus assay is intended as an aid in the diagnosis of influenza from Nasopharyngeal swab specimens and should not be used as a sole basis for treatment. Nasal washings and aspirates are unacceptable for Xpert Xpress SARS-CoV-2/FLU/RSV testing.  Fact Sheet for Patients: EntrepreneurPulse.com.au  Fact Sheet for Healthcare Providers: IncredibleEmployment.be  This test is not yet approved or cleared by the [place] FDA and has been authorized for detection and/or diagnosis of  SARS-CoV-2 by FDA under an Emergency Use Authorization (EUA). This EUA will remain in effect (meaning this  test can be used) for the duration of the COVID-19 declaration under Section 564(b)(1) of the Act, 21 U.S.C. section 360bbb-3(b)(1), unless the authorization is terminated or revoked.  Performed at [facility], [place] [address] [place], [place] [address]   Surgical PCR screen     Status: None   Collection Time: 03/08/20  9:29 AM   Specimen: Nasal Mucosa; Nasal Swab  Result Value Ref Range Status   MRSA, PCR NEGATIVE NEGATIVE Final   Staphylococcus aureus NEGATIVE NEGATIVE Final    Comment: (NOTE) The Xpert SA Assay (FDA approved for NASAL specimens in patients 7 years of age and older), is one component of a comprehensive surveillance program. It is not intended to diagnose infection nor to guide or monitor treatment. Performed at [facility], [place] [address] [place], [place] [address]          Radiology Studies: US Abdomen Complete  Result Date: 03/07/2020 CLINICAL DATA:  Abdominal distension EXAM: ABDOMEN ULTRASOUND COMPLETE COMPARISON:  None FINDINGS: Gallbladder: Surgically absent Common bile duct: Diameter: 5 mm, normal Liver: Normal echogenicity without mass or nodularity. Portal vein is patent on color Doppler imaging with normal direction of blood flow towards the liver. IVC: Short segment of intrahepatic IVC normal appearance, remainder obscured by bowel gas Pancreas: Portion of head to and proximal tail normal appearance, remainder obscured by bowel gas Spleen: Normal appearance, 4.8 cm length Right Kidney: Length: 9.8 cm. Normal cortical thickness. Increased cortical echogenicity. No mass or shadowing calcification. Mild hydronephrosis. Left Kidney: Length: 9.9 cm. Normal cortical thickness. Increased cortical echogenicity. No mass or calcification. Mild hydronephrosis. Abdominal aorta: Small portion of mid abdominal aorta  normal caliber, remainder obscured by bowel gas Other findings: No free fluid IMPRESSION: Post cholecystectomy. Medical renal disease changes of both kidneys with mild BILATERAL hydronephrosis of uncertain etiology. Inadequate visualization of pancreas and aorta. Electronically Signed   By: Lavonia Dana M.D.   On: 03/07/2020 18:25   DG C-Arm 1-60 Min-No Report  Result Date: 03/08/2020 Fluoroscopy was utilized by the requesting physician.  No radiographic interpretation.   ECHOCARDIOGRAM COMPLETE  Result Date: 03/08/2020    ECHOCARDIOGRAM REPORT   Patient Name:   JONAHTAN MANSEAU Date of Exam: 03/08/2020 Medical Rec #:  [id number]       Height:       66.0 in Accession #:    [id number]      Weight:       138.4 lb Date of Birth:  1932-09-03       BSA:          1.710 m Patient Age:    81 years        BP:           120/58 mmHg Patient Gender: M               HR:           92 bpm. Exam Location:  Inpatient Procedure: 2D Echo, Cardiac Doppler and Color Doppler Indications:    Murmur 785.2 / R01.1  History:        Patient has no prior history of Echocardiogram examinations.                 Risk Factors:Hypertension, Dyslipidemia and Former Smoker.  Sonographer:    Vickie Epley RDCS Referring Phys: [place]  1. Left ventricular ejection fraction, by estimation, is 55 to 60%. The left ventricle has normal function. The left ventricle has no  regional wall motion abnormalities. There is moderate left ventricular hypertrophy. Left ventricular diastolic parameters are indeterminate.  2. Right ventricular systolic function is normal. The right ventricular size is normal. Tricuspid regurgitation signal is inadequate for assessing PA pressure.  3. The mitral valve is normal in structure. No evidence of mitral valve regurgitation.  4. Aortic dilatation noted. There is mild dilatation of the aortic root, measuring 40 mm.  5. The inferior vena cava is normal in size with greater than 50% respiratory  variability, suggesting right atrial pressure of 3 mmHg.  6. The aortic valve is calcified. There is moderate calcification of the aortic valve. Aortic valve regurgitation is not visualized. Mild to moderate aortic valve stenosis. Vmax 2.2 m/s, MG 12 mmHg, AVA 1.5 cm^2, DI 0.33 FINDINGS  Left Ventricle: Left ventricular ejection fraction, by estimation, is 55 to 60%. The left ventricle has normal function. The left ventricle has no regional wall motion abnormalities. The left ventricular internal cavity size was normal in size. There is  moderate left ventricular hypertrophy. Left ventricular diastolic parameters are indeterminate. Right Ventricle: The right ventricular size is normal. Right vetricular wall thickness was not assessed. Right ventricular systolic function is normal. Tricuspid regurgitation signal is inadequate for assessing PA pressure. Left Atrium: Left atrial size was normal in size. Right Atrium: Right atrial size was normal in size. Pericardium: Trivial pericardial effusion is present. Mitral Valve: The mitral valve is normal in structure. No evidence of mitral valve regurgitation. Tricuspid Valve: The tricuspid valve is normal in structure. Tricuspid valve regurgitation is trivial. Aortic Valve: The aortic valve is calcified. There is moderate calcification of the aortic valve. Aortic valve regurgitation is not visualized. Mild to moderate aortic stenosis is present. Aortic valve mean gradient measures 12.0 mmHg. Aortic valve peak gradient measures 19.2 mmHg. Aortic valve area, by VTI measures 1.54 cm. Pulmonic Valve: The pulmonic valve was not well visualized. Pulmonic valve regurgitation is trivial. Aorta: Aortic dilatation noted. There is mild dilatation of the aortic root, measuring 40 mm. Venous: The inferior vena cava is normal in size with greater than 50% respiratory variability, suggesting right atrial pressure of 3 mmHg. IAS/Shunts: The interatrial septum was not well visualized.  LEFT  VENTRICLE PLAX 2D LVIDd:         4.90 cm      Diastology LVIDs:         3.40 cm      LV e' medial:    5.48 cm/s LV PW:         0.90 cm      LV E/e' medial:  16.3 LV IVS:        0.90 cm      LV e' lateral:   7.50 cm/s LVOT diam:     2.40 cm      LV E/e' lateral: 11.9 LV SV:         70 LV SV Index:   41 LVOT Area:     4.52 cm  LV Volumes (MOD) LV vol d, MOD A2C: 84.8 ml LV vol d, MOD A4C: 102.0 ml LV vol s, MOD A2C: 37.0 ml LV vol s, MOD A4C: 47.8 ml LV SV MOD A2C:     47.8 ml LV SV MOD A4C:     102.0 ml LV SV MOD BP:      51.4 ml RIGHT VENTRICLE RV S prime:     13.70 cm/s TAPSE (M-mode): 1.6 cm LEFT ATRIUM  Index       RIGHT ATRIUM           Index LA diam:      2.30 cm 1.34 cm/m  RA Area:     11.10 cm LA Vol (A2C): 22.1 ml 12.92 ml/m RA Volume:   25.40 ml  14.85 ml/m LA Vol (A4C): 28.1 ml 16.43 ml/m  AORTIC VALVE AV Area (Vmax):    1.82 cm AV Area (Vmean):   1.66 cm AV Area (VTI):     1.54 cm AV Vmax:           219.00 cm/s AV Vmean:          166.000 cm/s AV VTI:            0.451 m AV Peak Grad:      19.2 mmHg AV Mean Grad:      12.0 mmHg LVOT Vmax:         88.10 cm/s LVOT Vmean:        60.900 cm/s LVOT VTI:          0.154 m LVOT/AV VTI ratio: 0.34  AORTA Ao Root diam: 4.00 cm Ao Asc diam:  3.50 cm MITRAL VALVE MV Area (PHT): 3.39 cm     SHUNTS MV Decel Time: 224 msec     Systemic VTI:  0.15 m MV E velocity: 89.10 cm/s   Systemic Diam: 2.40 cm MV A velocity: 104.00 cm/s MV E/A ratio:  0.86 Oswaldo Milian MD Electronically signed by Oswaldo Milian MD Signature Date/Time: 03/08/2020/10:08:13 AM    Final    CT RENAL STONE STUDY  Result Date: 03/09/2020 CLINICAL DATA:  Hydronephrosis, chronic kidney disease EXAM: CT ABDOMEN AND PELVIS WITHOUT CONTRAST TECHNIQUE: Multidetector CT imaging of the abdomen and pelvis was performed following the standard protocol without IV contrast. COMPARISON:  Abdominal ultrasound 03/07/2020 FINDINGS: Lower chest: Bandlike densities in the lung bases more  concentrated in the right lower lobe, favoring atelectasis or scarring. Descending thoracic aortic atherosclerosis. Low-density blood pool suggests anemia. Hepatobiliary: Cholecystectomy. Pancreas: Unremarkable Spleen: Unremarkable Adrenals/Urinary Tract: 1.5 by 1.6 cm hypodense lesion of the left kidney upper pole, internal density 12 Hounsfield units on image 27 series 2, probably a cyst but technically nonspecific on today's noncontrast examination. Adrenal glands normal. Exophytic 1.3 by 1.3 cm lesion from the left kidney lower pole, internal density 10 Hounsfield units on image 43/2, probably a cyst but technically nonspecific. A Foley catheter is present in the urinary bladder which is nondistended but thick walled. Small amount of gas in the urinary bladder. Stomach/Bowel: Large periampullary duodenal diverticulum without findings of inflammation. Vascular/Lymphatic: Aortoiliac atherosclerotic vascular disease. Reproductive: Marked prostatomegaly. Other: No supplemental non-categorized findings. Musculoskeletal: There is abnormal gas tracking along the anterior margin of the left iliopsoas muscle down towards the insertion, and also along the left gluteus musculature and left upper thigh musculature anteriorly, probably related to the patient's left hip surgery this afternoon. Indistinctness of surrounding fascia planes, likely postoperative. Overall the amount of gas and edema in this vicinity is consistent with today's earlier operation. On images 69 and 70 of series 2, there is a trace amount of gas in what appears to be a subtle cortical discontinuity along the anterior wall of the acetabulum which is not readily apparent on the CT from 03/07/2020. The possibility of a subtle fracture in this vicinity is raised, although admittedly the finding is very localized/focal. Severe osteoarthritis of the right hip. Multilevel lumbar spondylosis and degenerative disc disease causing mild  degrees of foraminal  impingement. IMPRESSION: 1. There is a trace amount of gas in what appears to be a subtle cortical discontinuity along the anterior wall of the acetabulum which is not readily apparent on the hip CT from 03/07/2020. The possibility of a subtle anterior acetabular wall fracture in this vicinity is raised, although admittedly the finding is very localized/focal. 2. Marked prostatomegaly. 3. Other imaging findings of potential clinical significance: Low-density blood pool suggests anemia. 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 Severe osteoarthritis of the right hip. Expected postoperative findings in the tissue surrounding the left hip prosthesis. Multilevel lumbar spondylosis and degenerative disc disease causing mild degrees of foraminal impingement. 4. Aortic atherosclerosis. Aortic Atherosclerosis (ICD10-I70.0). Electronically Signed   By: Van Clines M.D.   On: 03/09/2020 14:28   DG Hip Port Unilat With Pelvis 1V Left  Result Date: 03/08/2020 CLINICAL DATA:  86 year old male undergoing hip arthroplasty after traumatic fracture. EXAM: DG HIP (WITH OR WITHOUT PELVIS) 1V PORT LEFT COMPARISON:  Intraoperative images 16 35 hours today. FINDINGS: Portable AP view at 1715 hours. Left hip bipolar arthroplasty demonstrated on this slightly oblique view. Hardware appears intact and normally aligned. Regional postoperative soft tissue gas. Overlying skin staples. No unexpected osseous changes. IMPRESSION: Mildly oblique view of bipolar left hip arthroplasty with no adverse features. Electronically Signed   By: Genevie Ann M.D.   On: 03/08/2020 19:32   DG HIP OPERATIVE UNILAT W OR W/O PELVIS LEFT  Result Date: 03/08/2020 CLINICAL DATA:  86 year old male undergoing hip arthroplasty after traumatic fracture. EXAM: OPERATIVE LEFT HIP (WITH PELVIS IF PERFORMED) 2 VIEWS TECHNIQUE: Fluoroscopic spot image(s) were submitted for interpretation post-operatively. COMPARISON:  LEFT HIP CT  03/07/2020. FINDINGS: Two intraoperative fluoroscopic AP spot views of the left hip demonstrate bipolar arthroplasty underway. No unexpected osseous changes are visible. FLUOROSCOPY TIME:  0 minutes 2 seconds IMPRESSION: Left hip arthroplasty underway. Electronically Signed   By: Genevie Ann M.D.   On: 03/08/2020 19:31        Scheduled Meds: . amLODipine  10 mg Oral Daily  . aspirin EC  325 mg Oral Q breakfast  . Chlorhexidine Gluconate Cloth  6 each Topical Daily  . docusate sodium  100 mg Oral BID  . sodium chloride flush  3 mL Intravenous Q12H  . tamsulosin  0.4 mg Oral QPC supper   Continuous Infusions: . lactated ringers 75 mL/hr at 03/09/20 0506     LOS: 2 days    Time spent:40 min    Bentleigh Waren, Geraldo Docker, MD [facility] Pager [phone]  If 7PM-7AM, please contact night-coverage [email or web address] Password [facility] 03/09/2020, 3:28 PM

## 2020-03-09 NOTE — Evaluation (Signed)
Physical Therapy Evaluation Patient Details Name: John Walton MRN: [id number] DOB: 12/29/1932 Today's Date: 03/09/2020   History of Present Illness  [name]  Clinical Impression  Pt admitted as above and presenting with functional mobility limitations 2* generalized weakness, decreased L LE strength/ROM, balance deficits, limited endurance, posterior THP and post op pain.  Pt would benefit from follow up rehab at SNF level to maximize IND and safety prior to dc home with ltd assist.    Follow Up Recommendations SNF    Equipment Recommendations  None recommended by PT    Recommendations for Other Services OT consult     Precautions / Restrictions Precautions Precautions: Fall;Posterior Hip Precaution Booklet Issued: Yes (comment) Required Braces or Orthoses: Knee Immobilizer - Left Knee Immobilizer - Left: On at all times Restrictions Weight Bearing Restrictions: No Other Position/Activity Restrictions: WBAT      Mobility  Bed Mobility Overal bed mobility: Needs Assistance Bed Mobility: Supine to Sit     Supine to sit: Mod assist;Max assist;+2 for physical assistance;+2 for safety/equipment     General bed mobility comments: Increased time with cues for sequence and use of R LE to self assist.  Physical assist to bring wt up and fwd and to balance in sitting    Transfers Overall transfer level: Needs assistance Equipment used: Rolling walker (2 wheeled) Transfers: Sit to/from Stand Sit to Stand: Mod assist;+2 physical assistance;+2 safety/equipment;From elevated surface         General transfer comment: cues for LE management, adherence to THP, and use of UEs to self assist.   Physical assist required to bring wt up and fwd and to balance in standing  Ambulation/Gait Ambulation/Gait assistance: Mod assist;+2 physical assistance;+2 safety/equipment Gait Distance (Feet): 4 Feet Assistive device: Rolling  walker (2 wheeled) Gait Pattern/deviations: Step-to pattern;Decreased step length - right;Decreased step length - left;Shuffle;Trunk flexed;Antalgic Gait velocity: decr   General Gait Details: cues for posture, position from RW and sequence.  Physical assist to advance L LE, to manage RW and for support/balance.  Stairs            Wheelchair Mobility    Modified Rankin (Stroke Patients Only)       Balance Overall balance assessment: Needs assistance Sitting-balance support: No upper extremity supported;Feet supported Sitting balance-Leahy Scale: Fair     Standing balance support: Bilateral upper extremity supported Standing balance-Leahy Scale: Poor                               Pertinent Vitals/Pain Pain Assessment: Faces Faces Pain Scale: Hurts even more Pain Location: L hip with movement Pain Descriptors / Indicators: Aching;Grimacing;Guarding;Sore Pain Intervention(s): Limited activity within patient's tolerance;Monitored during session;Premedicated before session    Home Living Family/patient expects to be discharged to:: Skilled nursing facility                      Prior Function Level of Independence: Independent               Hand Dominance        Extremity/Trunk Assessment   Upper Extremity Assessment Upper Extremity Assessment: Generalized weakness    Lower Extremity Assessment Lower Extremity Assessment: Generalized weakness;LLE deficits/detail       Communication   Communication: No difficulties  Cognition Arousal/Alertness: Awake/alert Behavior During Therapy: Flat affect Overall Cognitive Status: Within Functional Limits  for tasks assessed                                 General Comments: Very talkative and easily distracted - by self      General Comments      Exercises Total Joint Exercises Ankle Circles/Pumps: AROM;Both;15 reps;Supine   Assessment/Plan    PT Assessment Patient  needs continued PT services  PT Problem List Decreased strength;Decreased range of motion;Decreased activity tolerance;Decreased balance;Decreased mobility;Decreased knowledge of use of DME;Pain;Decreased knowledge of precautions       PT Treatment Interventions DME instruction;Gait training;Functional mobility training;Therapeutic activities;Therapeutic exercise;Patient/family education;Balance training    PT Goals (Current goals can be found in the Care Plan section)  Acute Rehab PT Goals Patient Stated Goal: Regain IND PT Goal Formulation: With patient Time For Goal Achievement: 03/23/20 Potential to Achieve Goals: Good    Frequency Min 3X/week   Barriers to discharge Decreased caregiver support spouse is physically limited in ability to assist    Co-evaluation               AM-PAC PT "6 Clicks" Mobility  Outcome Measure Help needed turning from your back to your side while in a flat bed without using bedrails?: A Lot Help needed moving from lying on your back to sitting on the side of a flat bed without using bedrails?: A Lot Help needed moving to and from a bed to a chair (including a wheelchair)?: A Lot Help needed standing up from a chair using your arms (e.g., wheelchair or bedside chair)?: A Lot Help needed to walk in hospital room?: A Lot Help needed climbing 3-5 steps with a railing? : Total 6 Click Score: 11    End of Session Equipment Utilized During Treatment: Gait belt Activity Tolerance: Patient limited by fatigue Patient left: in chair;with call bell/phone within reach;with chair alarm set;with family/visitor present Nurse Communication: Mobility status PT Visit Diagnosis: Difficulty in walking, not elsewhere classified (R26.2);History of falling (Z91.81);Unsteadiness on feet (R26.81);Pain Pain - Right/Left: Left Pain - part of body: Hip    Time: 9562-1308 PT Time Calculation (min) (ACUTE ONLY): 28 min   Charges:   PT Evaluation $PT Eval Low  Complexity: 1 Low PT Treatments $Gait Training: 8-22 mins        Debe Coder PT Acute Rehabilitation Services Pager [phone] Office [phone]   Lacie Landry 03/09/2020, 1:35 PM

## 2020-03-09 NOTE — TOC Initial Note (Addendum)
Transition of Care [facility]) - Initial/Assessment Note    Patient Details  Name: John Walton MRN: [id number] Date of Birth: Aug 27, 1932  [facility] of [facility] CM/SW Contact:    Lia Hopping, [place] Phone Number: 03/09/2020, 1:53 PM  Clinical Narrative:  Patient admitted after a fall, underwent surgical intervention a total hip arthroplasty.                 Patient resting comfortably upon arrival.  Spouse reports the pt. Is agreeable to rehab. CSW explain SNF and will follow up with bed offers. Patient is independent with his bathes and dresses self. Spouse does assist with preparing meals and transportation. Spouse reports she reached out to some of the preferred SNF's in the area to determine bed availability. CSW will follow up.  FL2/PASRR completed  Patient has been vaccinated.  Physician please order a covid test  Expected Discharge Plan: Skilled Nursing Facility Barriers to Discharge: Continued Medical Work up   Patient Goals and CMS Choice   CMS Medicare.gov Compare Post Acute Care list provided to:: Patient Choice offered to / list presented to : [facility]  Expected Discharge Plan and Services Expected Discharge Plan: Skilled Nursing Facility In-house Referral: Clinical Social Work Discharge Planning Services: CM Consult   Living arrangements for the past 2 months: [place]                                      Prior Living Arrangements/Services Living arrangements for the past 2 months: Single Family Home Lives with:: Spouse Patient language and need for interpreter reviewed:: No Do you feel safe going back to the place where you live?: Yes      Need for Family Participation in Patient Care: Yes (Comment) Care giver support system in place?: Yes (comment) Current home services: DME Criminal Activity/Legal Involvement Pertinent to Current Situation/Hospitalization: No - Comment as needed  Activities of Daily Living Home Assistive  Devices/Equipment: Hearing aid,Walker (specify type),Eyeglasses (bilateral hearing aides, walker with seat) ADL Screening (condition at time of admission) Patient's cognitive ability adequate to safely complete daily activities?: Yes Is the patient deaf or have difficulty hearing?: Yes (2 hearing aides) Does the patient have difficulty seeing, even when wearing glasses/contacts?: Yes (blood in left eye, suppose to have laser surgery on his retina soon) Does the patient have difficulty concentrating, remembering, or making decisions?: Yes (trouble with memory) Patient able to express need for assistance with ADLs?: Yes Does the patient have difficulty dressing or bathing?: Yes Independently performs ADLs?: No Communication: Independent Dressing (OT): Independent Grooming: Independent Feeding: Independent Bathing: Independent Toileting: Needs assistance Is this a change from baseline?: Change from baseline, expected to last >3days In/Out Bed: Needs assistance Is this a change from baseline?: Change from baseline, expected to last >3 days Walks in Home: Needs assistance Is this a change from baseline?: Change from baseline, expected to last >3 days Does the patient have difficulty walking or climbing stairs?: Yes Weakness of Legs: Left Weakness of Arms/Hands: None  Permission Sought/Granted Permission sought to share information with : Family Chief Financial Officer Permission granted to share information with : Yes, Verbal Permission Granted  Share Information with NAME: Dionne,[name]  Permission granted to share info w AGENCY: SNF's in the area  Permission granted to share info w Relationship: Spouse  Permission granted to share info w Contact Information: [phone]  Emotional Assessment Appearance:: Appears stated age  Affect (typically observed): Calm Orientation: : Oriented to Self,Oriented to Place,Oriented to  Time,Oriented to Situation Alcohol / Substance  Use: Not Applicable Psych Involvement: No (comment)  Admission diagnosis:  Abdominal mass [R19.00] Femoral neck fracture ([place]) [S72.009A] Closed fracture of left hip, initial encounter ([place]) [S72.002A] Acute renal failure superimposed on stage 4 chronic kidney disease ([place]) [N17.9, N18.4] Patient Active Problem List   Diagnosis Date Noted  . Bilateral hydronephrosis 03/08/2020  . Unilateral primary osteoarthritis, left hip   . Femoral neck fracture ([place]) 03/07/2020  . CKD (chronic kidney disease), stage IV ([place]) 03/07/2020  . Normocytic anemia 03/07/2020  . BPH (benign prostatic hyperplasia) 03/07/2020  . Abdominal mass 03/07/2020  . Scalp hematoma 03/07/2020  . Hypertensive urgency 03/07/2020  . Pain due to onychomycosis of toenails of both feet 08/13/2019  . RBBB 09/02/2014  . Screening, ischemic heart disease 07/11/2014  . Medicare annual wellness visit, subsequent 07/11/2014  . Prostate cancer screening 07/11/2014  . Essential hypertension 01/11/2014  . Hyperlipidemia 01/11/2014   PCP:  [name], MD Pharmacy:   [id number] - [place], [address] [place] [address] Phone: [phone] Fax: [phone]     Social Determinants of Health (SDOH) Interventions    Readmission Risk Interventions No flowsheet data found.

## 2020-03-09 NOTE — Progress Notes (Signed)
Physical Therapy Treatment Patient Details Name: John Walton MRN: [id number] DOB: 1932-07-23 Today's Date: 03/09/2020    History of Present Illness [name]    PT Comments    Pt assisted to return to bed with significant assist of 2 - transport has arrived to take pt to CT.   Follow Up Recommendations  SNF     Equipment Recommendations  None recommended by PT    Recommendations for Other Services OT consult     Precautions / Restrictions Precautions Precautions: Fall;Posterior Hip Precaution Booklet Issued: Yes (comment) Required Braces or Orthoses: Knee Immobilizer - Left Knee Immobilizer - Left: On at all times Restrictions Weight Bearing Restrictions: No Other Position/Activity Restrictions: WBAT    Mobility  Bed Mobility Overal bed mobility: Needs Assistance Bed Mobility: Sit to Supine       Sit to supine: Max assist;+2 for physical assistance;+2 for safety/equipment   General bed mobility comments: Increased time with cues for sequence and use of R LE to self assist.  Physical assist to control trunk and to manage LEs  Transfers Overall transfer level: Needs assistance Equipment used: Rolling walker (2 wheeled) Transfers: Sit to/from Stand Sit to Stand: Mod assist;+2 physical assistance;+2 safety/equipment;From elevated surface         General transfer comment: cues for LE management, adherence to THP, and use of UEs to self assist.   Physical assist required to bring wt up and fwd and to balance in standing  Ambulation/Gait Ambulation/Gait assistance: Mod assist;+2 physical assistance;+2 safety/equipment Gait Distance (Feet): 3 Feet Assistive device: Rolling walker (2 wheeled) Gait Pattern/deviations: Step-to pattern;Decreased step length - right;Decreased step length - left;Shuffle;Trunk flexed;Antalgic Gait velocity: decr   General Gait Details: cues for posture, position from RW and  sequence.  Physical assist to advance L LE, to manage RW and for support/balance.   Stairs             Wheelchair Mobility    Modified Rankin (Stroke Patients Only)       Balance Overall balance assessment: Needs assistance Sitting-balance support: No upper extremity supported;Feet supported Sitting balance-Leahy Scale: Fair     Standing balance support: Bilateral upper extremity supported Standing balance-Leahy Scale: Poor                              Cognition Arousal/Alertness: Awake/alert Behavior During Therapy: Flat affect Overall Cognitive Status: Within Functional Limits for tasks assessed                                 General Comments: Very talkative and easily distracted - by self      Exercises      General Comments        Pertinent Vitals/Pain Pain Assessment: Faces Faces Pain Scale: Hurts even more Pain Location: L hip with movement Pain Descriptors / Indicators: Aching;Grimacing;Guarding;Sore Pain Intervention(s): Limited activity within patient's tolerance;Monitored during session;Premedicated before session    Home Living                      Prior Function            PT Goals (current goals can now be found in the care plan section) Acute Rehab PT Goals Patient Stated Goal: Regain IND PT Goal Formulation: With patient Time For Goal  Achievement: 03/23/20 Potential to Achieve Goals: Good Progress towards PT goals: Progressing toward goals    Frequency    Min 3X/week      PT Plan Current plan remains appropriate    Co-evaluation              AM-PAC PT "6 Clicks" Mobility   Outcome Measure  Help needed turning from your back to your side while in a flat bed without using bedrails?: A Lot Help needed moving from lying on your back to sitting on the side of a flat bed without using bedrails?: A Lot Help needed moving to and from a bed to a chair (including a wheelchair)?: A  Lot Help needed standing up from a chair using your arms (e.g., wheelchair or bedside chair)?: A Lot Help needed to walk in hospital room?: A Lot Help needed climbing 3-5 steps with a railing? : Total 6 Click Score: 11    End of Session Equipment Utilized During Treatment: Gait belt Activity Tolerance: Patient limited by fatigue Patient left: in bed;with call bell/phone within reach;with bed alarm set Nurse Communication: Mobility status PT Visit Diagnosis: Difficulty in walking, not elsewhere classified (R26.2);History of falling (Z91.81);Unsteadiness on feet (R26.81);Pain Pain - Right/Left: Left Pain - part of body: Hip     Time: 2633-3545 PT Time Calculation (min) (ACUTE ONLY): 12 min  Charges:  $Therapeutic Activity: 8-22 mins                     Debe Coder PT Acute Rehabilitation Services Pager [phone] Office [phone]     Jondavid Schreier 03/09/2020, 3:52 PM

## 2020-03-10 DIAGNOSIS — D638 Anemia in other chronic diseases classified elsewhere: Secondary | ICD-10-CM

## 2020-03-10 LAB — LIPID PANEL
Cholesterol: 160 mg/dL (ref 0–200)
HDL: 37 mg/dL — ABNORMAL LOW (ref >40)
LDL Cholesterol: 95 mg/dL (ref 0–99)
Total CHOL/HDL Ratio: 4.3 RATIO
Triglycerides: 141 mg/dL (ref <150)
VLDL: 28 mg/dL (ref 0–40)

## 2020-03-10 LAB — CBC WITH DIFFERENTIAL/PLATELET
Abs Immature Granulocytes: 0.1 10*3/uL — ABNORMAL HIGH (ref 0.00–0.07)
Basophils Absolute: 0 10*3/uL (ref 0.0–0.1)
Basophils Relative: 0 %
Eosinophils Absolute: 0 10*3/uL (ref 0.0–0.5)
Eosinophils Relative: 0 %
HCT: 26 % — ABNORMAL LOW (ref 39.0–52.0)
Hemoglobin: 8.4 g/dL — ABNORMAL LOW (ref 13.0–17.0)
Immature Granulocytes: 1 %
Lymphocytes Relative: 5 %
Lymphs Abs: 0.6 10*3/uL — ABNORMAL LOW (ref 0.7–4.0)
MCH: 28.7 pg (ref 26.0–34.0)
MCHC: 32.3 g/dL (ref 30.0–36.0)
MCV: 88.7 fL (ref 80.0–100.0)
Monocytes Absolute: 1.5 10*3/uL — ABNORMAL HIGH (ref 0.1–1.0)
Monocytes Relative: 13 %
Neutro Abs: 9.6 10*3/uL — ABNORMAL HIGH (ref 1.7–7.7)
Neutrophils Relative %: 81 %
Platelets: 183 10*3/uL (ref 150–400)
RBC: 2.93 MIL/uL — ABNORMAL LOW (ref 4.22–5.81)
RDW: 13.2 % (ref 11.5–15.5)
WBC: 11.7 10*3/uL — ABNORMAL HIGH (ref 4.0–10.5)
nRBC: 0 % (ref 0.0–0.2)

## 2020-03-10 LAB — COMPREHENSIVE METABOLIC PANEL
ALT: 13 U/L (ref 0–44)
AST: 25 U/L (ref 15–41)
Albumin: 3.2 g/dL — ABNORMAL LOW (ref 3.5–5.0)
Alkaline Phosphatase: 60 U/L (ref 38–126)
Anion gap: 12 (ref 5–15)
BUN: 58 mg/dL — ABNORMAL HIGH (ref 8–23)
CO2: 18 mmol/L — ABNORMAL LOW (ref 22–32)
Calcium: 7.6 mg/dL — ABNORMAL LOW (ref 8.9–10.3)
Chloride: 108 mmol/L (ref 98–111)
Creatinine, Ser: 2.05 mg/dL — ABNORMAL HIGH (ref 0.61–1.24)
GFR, Estimated: 31 mL/min — ABNORMAL LOW (ref >60)
Glucose, Bld: 120 mg/dL — ABNORMAL HIGH (ref 70–99)
Potassium: 4.2 mmol/L (ref 3.5–5.1)
Sodium: 138 mmol/L (ref 135–145)
Total Bilirubin: 0.6 mg/dL (ref 0.3–1.2)
Total Protein: 6.3 g/dL — ABNORMAL LOW (ref 6.5–8.1)

## 2020-03-10 LAB — GLUCOSE, CAPILLARY
Glucose-Capillary: 101 mg/dL — ABNORMAL HIGH (ref 70–99)
Glucose-Capillary: 102 mg/dL — ABNORMAL HIGH (ref 70–99)
Glucose-Capillary: 113 mg/dL — ABNORMAL HIGH (ref 70–99)
Glucose-Capillary: 113 mg/dL — ABNORMAL HIGH (ref 70–99)
Glucose-Capillary: 113 mg/dL — ABNORMAL HIGH (ref 70–99)
Glucose-Capillary: 118 mg/dL — ABNORMAL HIGH (ref 70–99)

## 2020-03-10 LAB — MAGNESIUM: Magnesium: 2.1 mg/dL (ref 1.7–2.4)

## 2020-03-10 MED ORDER — ASPIRIN 325 MG PO TBEC: 325.0000 mg | DELAYED_RELEASE_TABLET | Freq: Every day | ORAL | 0 refills | Status: DC

## 2020-03-10 MED ORDER — HYDROCODONE-ACETAMINOPHEN 10-325 MG PO TABS: 1.0000 | ORAL_TABLET | Freq: Four times a day (QID) | ORAL | 0 refills | Status: DC | PRN

## 2020-03-10 MED ORDER — INSULIN ASPART 100 UNIT/ML ~~LOC~~ SOLN
0.0000 [IU] | Freq: Three times a day (TID) | SUBCUTANEOUS | Status: DC
  Administered 2020-03-11: 2 [IU] via SUBCUTANEOUS

## 2020-03-10 MED ORDER — ATORVASTATIN CALCIUM 40 MG PO TABS
40.0000 mg | ORAL_TABLET | Freq: Every day | ORAL | Status: DC
  Administered 2020-03-10 – 2020-03-13 (×4): 40 mg via ORAL
  Filled 2020-03-10 (×4): qty 1

## 2020-03-10 NOTE — Progress Notes (Signed)
Subjective: Doing well.  Hip pain controlled.     Objective: Vital signs in last 24 hours: Temp:  [97.7 F (36.5 C)-98.5 F (36.9 C)] 97.7 F (36.5 C) (12/10 0555) Pulse Rate:  [97-100] 99 (12/10 0555) Resp:  [16-20] 16 (12/10 0555) BP: (121-136)/(48-63) 121/63 (12/10 0555) SpO2:  [92 %-93 %] 93 % (12/10 0555)  Intake/Output from previous day: 12/09 0701 - 12/10 0700 In: 1320 [P.O.:1320] Out: 875 [Urine:875] Intake/Output this shift: Total I/O In: 60 [P.O.:60] Out: -   Recent Labs    03/07/20 1156 03/08/20 0633 03/09/20 0338 03/10/20 0356  HGB 11.5* 9.5* 9.4* 8.4*   Recent Labs    03/09/20 0338 03/10/20 0356  WBC 9.1 11.7*  RBC 3.28*  3.35* 2.93*  HCT 29.2* 26.0*  PLT 164 183   Recent Labs    03/09/20 0338 03/10/20 0356  NA 141 138  K 4.7 4.2  CL 110 108  CO2 19* 18*  BUN 47* 58*  CREATININE 1.96* 2.05*  GLUCOSE 210* 120*  CALCIUM 8.0* 7.6*   Recent Labs    03/07/20 1156  INR 1.1    Exam: Pleasant male alert and oriented, NAD.  Left hip wound looks good.  Staples intact.  No drainage or signs of infection.  Calf nontender. NVI.     Assessment/Plan: Continue present care.  Stable from ortho standpoint.  Scripts on chart for norco and aspirin (dvt prophylaxis).      Benjiman Core 03/10/2020, 10:24 AM

## 2020-03-10 NOTE — Progress Notes (Signed)
Physical Therapy Treatment Patient Details Name: John Walton MRN: [id number] DOB: 03/05/1933 Today's Date: 03/10/2020    History of Present Illness [name]    PT Comments    Pt cooperative but progressing slowly with mobility and requiring increased time for all tasks.  Pt performed therex program with assist and up to attempt ambulation but ltd by drop in BP to 91/44 - pt stand pvt with assist to recliner and RN alerted.  BP 112/48 up in chair.   Follow Up Recommendations  SNF     Equipment Recommendations  None recommended by PT    Recommendations for Other Services OT consult     Precautions / Restrictions Precautions Precautions: Fall;Posterior Hip Precaution Booklet Issued: Yes (comment) Precaution Comments: Pt recalls no THP - all precautions reviewed with pt and spouse Required Braces or Orthoses: Knee Immobilizer - Left Knee Immobilizer - Left: On at all times Restrictions Weight Bearing Restrictions: No LLE Weight Bearing: Weight bearing as tolerated    Mobility  Bed Mobility Overal bed mobility: Needs Assistance Bed Mobility: Supine to Sit     Supine to sit: Mod assist;Max assist;+2 for physical assistance;+2 for safety/equipment     General bed mobility comments: Increased time with cues for sequence and use of R LE to self assist.  Physical assist to control trunk and to manage LEs  Transfers Overall transfer level: Needs assistance Equipment used: Rolling walker (2 wheeled) Transfers: Sit to/from [facility] Sit to Stand: Mod assist;+2 physical assistance;+2 safety/equipment;From elevated surface Stand pivot transfers: Mod assist;+2 physical assistance       General transfer comment: cues for LE management, adherence to THP, and use of UEs to self assist.   Physical assist required to bring wt up and fwd and to balance in standing  Ambulation/Gait Ambulation/Gait  assistance: Mod assist;+2 physical assistance;+2 safety/equipment Gait Distance (Feet): 0 Feet Assistive device: Rolling walker (2 wheeled)       General Gait Details: Pt requiring assist to advance L LE but unable to follow cues to advance R LE - pt moved to sitting - BP 91/44   Stairs             Wheelchair Mobility    Modified Rankin (Stroke Patients Only)       Balance Overall balance assessment: Needs assistance Sitting-balance support: No upper extremity supported;Feet supported Sitting balance-Leahy Scale: Fair     Standing balance support: Bilateral upper extremity supported Standing balance-Leahy Scale: Poor                              Cognition Arousal/Alertness: Awake/alert Behavior During Therapy: Flat affect Overall Cognitive Status: Within Functional Limits for tasks assessed                                        Exercises Total Joint Exercises Ankle Circles/Pumps: AROM;Both;15 reps;Supine Quad Sets: AROM;Both;10 reps;Supine Heel Slides: AAROM;Left;15 reps;Supine Hip ABduction/ADduction: AAROM;Left;15 reps;Supine    General Comments        Pertinent Vitals/Pain Pain Assessment: Faces Faces Pain Scale: Hurts little more Pain Location: L hip with movement Pain Descriptors / Indicators: Aching;Guarding;Sore Pain Intervention(s): Limited activity within patient's tolerance;Monitored during session;Premedicated before session    Home Living  Prior Function            PT Goals (current goals can now be found in the care plan section) Acute Rehab PT Goals Patient Stated Goal: Regain IND PT Goal Formulation: With patient Time For Goal Achievement: 03/23/20 Potential to Achieve Goals: Good Progress towards PT goals: Not progressing toward goals - comment (orthostatic)    Frequency    Min 3X/week      PT Plan Current plan remains appropriate    Co-evaluation               AM-PAC PT "6 Clicks" Mobility   Outcome Measure  Help needed turning from your back to your side while in a flat bed without using bedrails?: A Lot Help needed moving from lying on your back to sitting on the side of a flat bed without using bedrails?: A Lot Help needed moving to and from a bed to a chair (including a wheelchair)?: A Lot Help needed standing up from a chair using your arms (e.g., wheelchair or bedside chair)?: A Lot Help needed to walk in hospital room?: Total Help needed climbing 3-5 steps with a railing? : Total 6 Click Score: 10    End of Session Equipment Utilized During Treatment: Gait belt Activity Tolerance: Patient limited by fatigue;Other (comment) (orthostatic) Patient left: in chair;with call bell/phone within reach;with family/visitor present;with nursing/sitter in room Nurse Communication: Mobility status PT Visit Diagnosis: Difficulty in walking, not elsewhere classified (R26.2);History of falling (Z91.81);Unsteadiness on feet (R26.81);Pain Pain - Right/Left: Left Pain - part of body: Hip     Time: 4917-9150 PT Time Calculation (min) (ACUTE ONLY): 33 min  Charges:  $Therapeutic Exercise: 8-22 mins $Therapeutic Activity: 8-22 mins                     Debe Coder PT Acute Rehabilitation Services Pager [phone] Office [phone]    Brookley Spitler 03/10/2020, 2:03 PM

## 2020-03-10 NOTE — Plan of Care (Signed)
Plan of care reviewed and discussed with the patient. 

## 2020-03-10 NOTE — Care Management Important Message (Signed)
Important Message  Patient Details IM Letter given to the Patient Name: John Walton MRN: [id number] Date of Birth: 04-11-1932   Medicare Important Message Given:  Yes     [name] 03/10/2020, 1:15 PM

## 2020-03-10 NOTE — Discharge Instructions (Addendum)
INSTRUCTIONS AFTER JOINT REPLACEMENT   o Remove items at home which could result in a fall. This includes throw rugs or furniture in walking pathways o ICE to the affected joint every three hours while awake for 30 minutes at a time, for at least the first 3-5 days, and then as needed for pain and swelling.  Continue to use ice for pain and swelling. You may notice swelling that will progress down to the foot and ankle.  This is normal after surgery.  Elevate your leg when you are not up walking on it.   o Continue to use the breathing machine you got in the hospital (incentive spirometer) which will help keep your temperature down.  It is common for your temperature to cycle up and down following surgery, especially at night when you are not up moving around and exerting yourself.  The breathing machine keeps your lungs expanded and your temperature down.   DIET:  As you were doing prior to hospitalization, we recommend a well-balanced diet.  DRESSING / WOUND CARE / SHOWERING RN at skilled facility will perform daily wound checks and dressing changes with 4 x 4 gauze and tape.  Okay to shower without dressing on.  No tub soaking.  Do not apply any creams or ointments to the surgical site.  If any questions regarding the wound RN should contact our office immediately.   ACTIVITY  o Increase activity slowly as tolerated, but follow the weight bearing instructions below.   o No driving for 6 weeks or until further direction given by your physician.  You cannot drive while taking narcotics.  o No lifting or carrying greater than 10 lbs. until further directed by your surgeon. o Avoid periods of inactivity such as sitting longer than an hour when not asleep. This helps prevent blood clots.  o You may return to work once you are authorized by your doctor.     WEIGHT BEARING  Weight-bear as tolerated left lower extremity.  Must follow posterior total hip precautions.    EXERCISES  PT protocol  with therapist.  Faythe Ghee to remove knee immobilizer when working with therapy.  Recommend patient have knee immobilizer on when he is in bed.  CONSTIPATION  Constipation is defined medically as fewer than three stools per week and severe constipation as less than one stool per week.  Even if you have a regular bowel pattern at home, your normal regimen is likely to be disrupted due to multiple reasons following surgery.  Combination of anesthesia, postoperative narcotics, change in appetite and fluid intake all can affect your bowels.   YOU MUST use at least one of the following options; they are listed in order of increasing strength to get the job done.  They are all available over the counter, and you may need to use some, POSSIBLY even all of these options:    Drink plenty of fluids (prune juice may be helpful) and high fiber foods Colace 100 mg by mouth twice a day  Senokot for constipation as directed and as needed Dulcolax (bisacodyl), take with full glass of water  Miralax (polyethylene glycol) once or twice a day as needed.  If you have tried all these things and are unable to have a bowel movement in the first 3-4 days after surgery call either your surgeon or your primary doctor.    If you experience loose stools or diarrhea, hold the medications until you stool forms back up.  If your symptoms do not  get better within 1 week or if they get worse, check with your doctor.  If you experience "the worst abdominal pain ever" or develop nausea or vomiting, please contact the office immediately for further recommendations for treatment.   ITCHING:  If you experience itching with your medications, try taking only a single pain pill, or even half a pain pill at a time.  You can also use Benadryl over the counter for itching or also to help with sleep.   TED HOSE STOCKINGS:  Use stockings on both legs until for at least 2 weeks or as directed by physician office. They may be removed at night for  sleeping.  MEDICATIONS:  See your medication summary on the "After Visit Summary" that nursing will review with you.  You may have some home medications which will be placed on hold until you complete the course of blood thinner medication.  It is important for you to complete the blood thinner medication as prescribed.  PRECAUTIONS:  If you experience chest pain or shortness of breath - call 911 immediately for transfer to the hospital emergency department.   If you develop a fever greater that 101 F, purulent drainage from wound, increased redness or drainage from wound, foul odor from the wound/dressing, or calf pain - CONTACT YOUR SURGEON.                                                   FOLLOW-UP APPOINTMENTS:  If you do not already have a post-op appointment, please call the office for an appointment to be seen by your surgeon.  Guidelines for how soon to be seen are listed in your "After Visit Summary", but are typically between 1-4 weeks after surgery.  OTHER INSTRUCTIONS:   Knee Replacement:  Do not place pillow under knee, focus on keeping the knee straight while resting. CPM instructions: 0-90 degrees, 2 hours in the morning, 2 hours in the afternoon, and 2 hours in the evening. Place foam block, curve side up under heel at all times except when in CPM or when walking.  DO NOT modify, tear, cut, or change the foam block in any way.   DENTAL ANTIBIOTICS:  In most cases prophylactic antibiotics for Dental procdeures after total joint surgery are not necessary.  Exceptions are as follows:  1. History of prior total joint infection  2. Severely immunocompromised (Organ Transplant, cancer chemotherapy, Rheumatoid biologic meds such as Russell)  3. Poorly controlled diabetes (A1C &gt; 8.0, blood glucose over 200)  If you have one of these conditions, contact your surgeon for an antibiotic prescription, prior to your dental procedure.   MAKE SURE YOU:  . Understand these  instructions.  . Get help right away if you are not doing well or get worse.    Thank you for letting us be a part of your medical care team.  It is a privilege we respect greatly.  We hope these instructions will help you stay on track for a fast and full recovery!     Heart Healthy, Consistent Carbohydrate Nutrition Therapy   A heart-healthy and consistent carbohydrate diet is recommended to manage heart disease and diabetes. To follow a heart-healthy and consistent carbohydrate diet, . Eat a balanced diet with whole grains, fruits and vegetables, and lean protein sources.  . Choose heart-healthy unsaturated fats. Limit saturated fats,  trans fats, and cholesterol intake. Eat more plant-based or vegetarian meals using beans and soy foods for protein.  . Eat whole, unprocessed foods to limit the amount of sodium (salt) you eat.  . Choose a consistent amount of carbohydrate at each meal and snack. Limit refined carbohydrates especially sugar, sweets and sugar-sweetened beverages.  . If you drink alcohol, do so in moderation: one serving per day (women) and two servings per day (men). o One serving is equivalent to 12 ounces beer, 5 ounces wine, or 1.5 ounces distilled spirits  Tips Tips for Choosing Heart-Healthy Fats Choose lean protein and low-fat dairy foods to reduce saturated fat intake. . Saturated fat is usually found in animal-based protein and is associated with certain health risks. Saturated fat is the biggest contributor to raise low-density lipoprotein (LDL) cholesterol levels. Research shows that limiting saturated fat lowers unhealthy cholesterol levels. Eat no more than 7% of your total calories each day from saturated fat. Ask your RDN to help you determine how much saturated fat is right for you. . There are many foods that do not contain large amounts of saturated fats. Swapping these foods to replace foods high in saturated fats will help you limit the saturated fat you eat  and improve your cholesterol levels. You can also try eating more plant-based or vegetarian meals. Instead of. Try:  Whole milk, cheese, yogurt, and ice cream 1% or skim milk, low-fat cheese, non-fat yogurt, and low-fat ice cream  Fatty, marbled beef and pork Lean beef, pork, or venison  Poultry with skin Poultry without skin  Butter, stick margarine Reduced-fat, whipped, or liquid spreads  Coconut oil, palm oil Liquid vegetable oils: corn, canola, olive, soybean and safflower oils   Avoid foods that contain trans fats. . Trans fats increase levels of LDL-cholesterol. Hydrogenated fat in processed foods is the main source of trans fats in foods.  . Trans fats can be found in stick margarine, shortening, processed sweets, baked goods, some fried foods, and packaged foods made with hydrogenated oils. Avoid foods with "partially hydrogenated oil" on the ingredient list such as: cookies, pastries, baked goods, biscuits, crackers, microwave popcorn, and frozen dinners. Choose foods with heart healthy fats. . Polyunsaturated and monounsaturated fat are unsaturated fats that may help lower your blood cholesterol level when used in place of saturated fat in your diet. . Ask your RDN about taking a dietary supplement with plant sterols and stanols to help lower your cholesterol level. Marland Kitchen Research shows that substituting saturated fats with unsaturated fats is beneficial to cholesterol levels. Try these easy swaps: Instead of. Try:  Butter, stick margarine, or solid shortening Reduced-fat, whipped, or liquid spreads  Beef, pork, or poultry with skin Fish and seafood  Chips, crackers, snack foods Raw or unsalted nuts and seeds or nut butters Hummus with vegetables Avocado on toast  Coconut oil, palm oil Liquid vegetable oils: corn, canola, olive, soybean and safflower oils  Limit the amount of cholesterol you eat to less than 200 milligrams per day. . Cholesterol is a substance carried through the  bloodstream via lipoproteins, which are known as "transporters" of fat. Some body functions need cholesterol to work properly, but too much cholesterol in the bloodstream can damage arteries and build up blood vessel linings (which can lead to heart attack and stroke). You should eat less than 200 milligrams cholesterol per day. Marland Kitchen People respond differently to eating cholesterol. There is no test available right now that can figure out which people will respond  more to dietary cholesterol and which will respond less. For individuals with high intake of dietary cholesterol, different types of increase (none, small, moderate, large) in LDL-cholesterol levels are all possible.  . Food sources of cholesterol include egg yolks and organ meats such as liver, gizzards. Limit egg yolks to two to four per week and avoid organ meats like liver and gizzards to control cholesterol intake. Tips for Choosing Heart-Healthy Carbohydrates Consume a consistent amount of carbohydrate . It is important to eat foods with carbohydrates in moderation because they impact your blood glucose level. Carbohydrates can be found in many foods such as: . Grains (breads, crackers, rice, pasta, and cereals)  . Starchy Vegetables (potatoes, corn, and peas)  . Beans and legumes  . Milk, soy milk, and yogurt  . Fruit and fruit juice  . Sweets (cakes, cookies, ice cream, jam and jelly) . Your RDN will help you set a goal for how many carbohydrate servings to eat at your meals and snacks. For many adults, eating 3 to 5 servings of carbohydrate foods at each meal and 1 or 2 carbohydrate servings for each snack works well.  . Check your blood glucose level regularly. It can tell you if you need to adjust when you eat carbohydrates. . Choose foods rich in viscous (soluble) fiber . Viscous, or soluble, is found in the walls of plant cells. Viscous fiber is found only in plant-based foods. Eating foods with fiber helps to lower your unhealthy  cholesterol and keep your blood glucose in range  . Rich sources of viscous fiber include vegetables (asparagus, Brussels sprouts, sweet potatoes, turnips) fruit (apricots, mangoes, oranges), legumes, and whole grains (barley, oats, and oat bran).  . As you increase your fiber intake gradually, also increase the amount of water you drink. This will help prevent constipation.  . If you have difficulty achieving this goal, ask your RDN about fiber laxatives. Choose fiber supplements made with viscous fibers such as psyllium seed husks or methylcellulose to help lower unhealthy cholesterol.  . Limit refined carbohydrates  . There are three types of carbohydrates: starches, sugar, and fiber. Some carbohydrates occur naturally in food, like the starches in rice or corn or the sugars in fruits and milk. Refined carbohydrates--foods with high amounts of simple sugars--can raise triglyceride levels. High triglyceride levels are associated with coronary heart disease. . Some examples of refined carbohydrate foods are table sugar, sweets, and beverages sweetened with added sugar. Tips for Reducing Sodium (Salt) Although sodium is important for your body to function, too much sodium can be harmful for people with high blood pressure. As sodium and fluid buildup in your tissues and bloodstream, your blood pressure increases. High blood pressure may cause damage to other organs and increase your risk for a stroke. Even if you take a pill for blood pressure or a water pill (diuretic) to remove fluid, it is still important to have less salt in your diet. Ask your doctor and RDN what amount of sodium is right for you. Marland Kitchen Avoid processed foods. Eat more fresh foods.  . Fresh fruits and vegetables are naturally low in sodium, as well as frozen vegetables and fruits that have no added juices or sauces.  . Fresh meats are lower in sodium than processed meats, such as bacon, sausage, and hotdogs. Read the nutrition label or  ask your butcher to help you find a fresh meat that is low in sodium. . Eat less salt--at the table and when cooking.  Marland Kitchen  A single teaspoon of table salt has 2,300 mg of sodium.  . Leave the salt out of recipes for pasta, casseroles, and soups.  . Ask your RDN how to cook your favorite recipes without sodium . Be a Paramedic.  . Look for food packages that say "salt-free" or "sodium-free." These items contain less than 5 milligrams of sodium per serving.  Marland Kitchen "Very low-sodium" products contain less than 35 milligrams of sodium per serving.  Marland Kitchen "Low-sodium" products contain less than 140 milligrams of sodium per serving.  . Beware for "Unsalted" or "No Added Salt" products. These items may still be high in sodium. Check the nutrition label. . Add flavors to your food without adding sodium.  . Try lemon juice, lime juice, fruit juice or vinegar.  . Dry or fresh herbs add flavor. Try basil, bay leaf, dill, rosemary, parsley, sage, dry mustard, nutmeg, thyme, and paprika.  . Pepper, red pepper flakes, and cayenne pepper can add spice t your meals without adding sodium. Hot sauce contains sodium, but if you use just a drop or two, it will not add up to much.  Sharyn Lull a sodium-free seasoning blend or make your own at home. Additional Lifestyle Tips Achieve and maintain a healthy weight. . Talk with your RDN or your doctor about what is a healthy weight for you. . Set goals to reach and maintain that weight.  . To lose weight, reduce your calorie intake along with increasing your physical activity. A weight loss of 10 to 15 pounds could reduce LDL-cholesterol by 5 milligrams per deciliter. Participate in physical activity. . Talk with your health care team to find out what types of physical activity are best for you. Set a plan to get about 30 minutes of exercise on most days.  Foods Recommended Food Group Foods Recommended  Grains Whole grain breads and cereals, including whole wheat, barley, rye,  buckwheat, corn, teff, quinoa, millet, amaranth, brown or wild rice, sorghum, and oats Pasta, especially whole wheat or other whole grain types  [employer], quinoa or wild rice Whole grain crackers, bread, rolls, pitas Home-made bread with reduced-sodium baking soda  Protein Foods Lean cuts of beef and pork (loin, leg, round, extra lean hamburger)  Skinless Cytogeneticist and other wild game Dried beans and peas Nuts and nut butters Meat alternatives made with soy or textured vegetable protein  Egg whites or egg substitute Cold cuts made with lean meat or soy protein  Dairy Nonfat (skim), low-fat, or 1%-fat milk  Nonfat or low-fat yogurt or cottage cheese Fat-free and low-fat cheese  Vegetables Fresh, frozen, or canned vegetables without added fat or salt   Fruits Fresh, frozen, canned, or dried fruit   Oils Unsaturated oils (corn, olive, peanut, soy, sunflower, canola)  Soft or liquid margarines and vegetable oil spreads  Salad dressings Seeds and nuts  Avocado   Foods Not Recommended Food Group Foods Not Recommended  Grains Breads or crackers topped with salt Cereals (hot or cold) with more than 300 mg sodium per serving Biscuits, cornbread, and other "quick" breads prepared with baking soda Bread crumbs or stuffing mix from a store High-fat bakery products, such as doughnuts, biscuits, croissants, danish pastries, pies, cookies Instant cooking foods to which you add hot water and stir--potatoes, noodles, rice, etc. Packaged starchy foods--seasoned noodle or rice dishes, stuffing mix, macaroni and cheese dinner Snacks made with partially hydrogenated oils, including chips, cheese puffs, snack mixes, regular crackers, butter-flavored popcorn  Protein Foods Higher-fat  cuts of meats (ribs, t-bone steak, regular hamburger) Bacon, sausage, or hot dogs Cold cuts, such as salami or bologna, deli meats, cured meats, corned beef Organ meats (liver, brains, gizzards,  sweetbreads) Poultry with skin Fried or smoked meat, poultry, and fish Whole eggs and egg yolks (more than 2-4 per week) Salted legumes, nuts, seeds, or nut/seed butters Meat alternatives with high levels of sodium (>300 mg per serving) or saturated fat (>5 g per serving)  Dairy Whole milk,?2% fat milk, buttermilk Whole milk yogurt or ice cream Cream Half-&-half Cream cheese Sour cream Cheese  Vegetables Canned or frozen vegetables with salt, fresh vegetables prepared with salt, butter, cheese, or cream sauce Fried vegetables Pickled vegetables such as olives, pickles, or sauerkraut  Fruits Fried fruits Fruits served with butter or cream  Oils Butter, stick margarine, shortening Partially hydrogenated oils or trans fats Tropical oils (coconut, palm, palm kernel oils)  Other Candy, sugar sweetened soft drinks and desserts Salt, sea salt, garlic salt, and seasoning mixes containing salt Bouillon cubes Ketchup, barbecue sauce, Worcestershire sauce, soy sauce, teriyaki sauce Miso Salsa Pickles, olives, relish   Heart Healthy Consistent Carbohydrate Vegetarian (Lacto-Ovo) Sample 1-Day Menu  Breakfast 1 cup oatmeal, cooked (2 carbohydrate servings)   cup blueberries (1 carbohydrate serving)  11 almonds, without salt  1 cup 1% milk (1 carbohydrate serving)  1 cup coffee  Morning Snack 1 cup fat-free plain yogurt (1 carbohydrate serving)  Lunch 1 whole wheat bun (1 carbohydrate servings)  1 black bean burger (1 carbohydrate servings)  1 slice cheddar cheese, low sodium  2 slices tomatoes  2 leaves lettuce  1 teaspoon mustard  1 small pear (1 carbohydrate servings)  1 cup green tea, unsweetened  Afternoon Snack 1/3 cup trail mix with nuts, seeds, and raisins, without salt (1 carbohydrate servinga)  Evening Meal  cup meatless chicken  2/3 cup brown rice, cooked (2 carbohydrate servings)  1 cup broccoli, cooked (2/3 carbohydrate serving)   cup carrots, cooked (1/3  carbohydrate serving)  2 teaspoons olive oil  1 teaspoon balsamic vinegar  1 whole wheat dinner roll (1 carbohydrate serving)  1 teaspoon margarine, soft, tub  1 cup 1% milk (1 carbohydrate serving)  Evening Snack 1 extra small banana (1 carbohydrate serving)  1 tablespoon peanut butter   Heart Healthy Consistent Carbohydrate Vegan Sample 1-Day Menu  Breakfast 1 cup oatmeal, cooked (2 carbohydrate servings)   cup blueberries (1 carbohydrate serving)  11 almonds, without salt  1 cup soymilk fortified with calcium, vitamin B12, and vitamin D  1 cup coffee  Morning Snack 6 ounces soy yogurt (1 carbohydrate servings)  Lunch 1 whole wheat bun(1 carbohydrate servings)  1 black bean burger (1 carbohydrate serving)  2 slices tomatoes  2 leaves lettuce  1 teaspoon mustard  1 small pear (1 carbohydrate servings)  1 cup green tea, unsweetened  Afternoon Snack 1/3 cup trail mix with nuts, seeds, and raisins, without salt (1 carbohydrate servings)  Evening Meal  cup meatless chicken  2/3 cup brown rice, cooked (2 carbohydrate servings)  1 cup broccoli, cooked (2/3 carbohydrate serving)   cup carrots, cooked (1/3 carbohydrate serving)  2 teaspoons olive oil  1 teaspoon balsamic vinegar  1 whole wheat dinner roll (1 carbohydrate serving)  1 teaspoon margarine, soft, tub  1 cup soymilk fortified with calcium, vitamin B12, and vitamin D  Evening Snack 1 extra small banana (1 carbohydrate serving)  1 tablespoon peanut butter    Heart Healthy Consistent Carbohydrate Sample 1-Day  Menu  Breakfast 1 cup cooked oatmeal (2 carbohydrate servings)  3/4 cup blueberries (1 carbohydrate serving)  1 ounce almonds  1 cup skim milk (1 carbohydrate serving)  1 cup coffee  Morning Snack 1 cup sugar-free nonfat yogurt (1 carbohydrate serving)  Lunch 2 slices whole-wheat bread (2 carbohydrate servings)  2 ounces lean [place] breast  1 ounce low-fat Swiss cheese  1 teaspoon mustard  1 slice tomato   1 lettuce leaf  1 small pear (1 carbohydrate serving)  1 cup skim milk (1 carbohydrate serving)  Afternoon Snack 1 ounce trail mix with unsalted nuts, seeds, and raisins (1 carbohydrate serving)  Evening Meal 3 ounces salmon  2/3 cup cooked brown rice (2 carbohydrate servings)  1 teaspoon soft margarine  1 cup cooked broccoli with 1/2 cup cooked carrots (1 carbohydrate serving  Carrots, cooked, boiled, drained, without salt  1 cup lettuce  1 teaspoon olive oil with vinegar for dressing  1 small whole grain roll (1 carbohydrate serving)  1 teaspoon soft margarine  1 cup unsweetened tea  Evening Snack 1 extra-small banana (1 carbohydrate serving)  Copyright 2020  [employer]. All rights reserved.

## 2020-03-10 NOTE — TOC Progression Note (Signed)
Transition of Care [facility]) - Progression Note    Patient Details  Name: BURTON GAHAN MRN: [id number] Date of Birth: 12-13-1932  Transition of Care [facility]) CM/SW [place], LCSW Phone Number: 03/10/2020, 10:50 AM  Clinical Narrative:    [facility] re[facility].  CSW notified pt. And spouse. Spouse agreeable to private room pay rate.  Physician please order covid test, prior to discharge.   TOC staff will continue to follow this patient.    Expected Discharge Plan: [place] Barriers to Discharge: Continued Medical Work up  Expected Discharge Plan and Services Expected Discharge Plan: [place] In-house Referral: Clinical Social Work Discharge Planning Services: CM Consult   Living arrangements for the past 2 months: Single Family Home                                       Social Determinants of Health (SDOH) Interventions    Readmission Risk Interventions No flowsheet data found.

## 2020-03-10 NOTE — Progress Notes (Signed)
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   Follow Up Recommendations  SNF     Equipment Recommendations  None recommended by PT    Recommendations for Other Services OT consult     Precautions / Restrictions Precautions Precautions: Fall;Posterior Hip Precaution Booklet Issued: Yes (comment) Precaution Comments: Pt recalls no THP - all precautions reviewed with pt and spouse Required Braces or Orthoses: Knee Immobilizer - Left Knee Immobilizer - Left: On at all times Restrictions Weight Bearing Restrictions: No LLE Weight Bearing: Weight bearing as tolerated    Mobility  Bed Mobility Overal bed mobility: Needs Assistance Bed Mobility: Sit to Supine     Supine to sit: Mod assist;Max assist;+2 for physical assistance;+2 for safety/equipment Sit to supine: Max assist;+2 for physical assistance;+2 for safety/equipment   General bed mobility comments: Increased time with cues for sequence and use of R LE to self assist.  Physical assist to control trunk and to manage LEs  Transfers Overall transfer level: Needs assistance Equipment used: Rolling walker (2 wheeled) Transfers: Sit to/from [facility] Sit to Stand: Mod assist;+2 physical assistance;+2 safety/equipment;From elevated surface Stand pivot transfers: Mod assist;+2 physical assistance       General transfer comment: cues for LE management, adherence to THP, and use of UEs to self assist.   Physical assist required to bring wt up and fwd and to balance in standing  Ambulation/Gait Ambulation/Gait assistance: Mod assist;+2 physical assistance;+2 safety/equipment Gait Distance  (Feet): 1 Feet Assistive device: Rolling walker (2 wheeled) Gait Pattern/deviations: Step-to pattern;Decreased step length - right;Decreased step length - left;Shuffle;Trunk flexed;Antalgic Gait velocity: decr   General Gait Details: Pt requiring assist to advance L LE but unable to follow cues to advance R LE - pt moved to sitting   Stairs             Wheelchair Mobility    Modified Rankin (Stroke Patients Only)       Balance Overall balance assessment: Needs assistance Sitting-balance support: No upper extremity supported;Feet supported Sitting balance-Leahy Scale: Fair     Standing balance support: Bilateral upper extremity supported Standing balance-Leahy Scale: Poor                              Cognition Arousal/Alertness: Awake/alert Behavior During Therapy: Flat affect Overall Cognitive Status: Within Functional Limits for tasks assessed                                 General Comments: Very talkative and easily distracted - by self      Exercises Total Joint Exercises Ankle Circles/Pumps: AROM;Both;15 reps;Supine Quad Sets: AROM;Both;10 reps;Supine Heel Slides: AAROM;Left;15 reps;Supine Hip ABduction/ADduction: AAROM;Left;15 reps;Supine    General Comments        Pertinent Vitals/Pain Pain Assessment: Faces Faces Pain Scale: Hurts little more Pain Location: L hip with movement Pain Descriptors / Indicators: Aching;Guarding;Sore Pain Intervention(s): Limited activity within patient's tolerance;Monitored during session;Premedicated before session;Ice applied    Home Living  Prior Function            PT Goals (current goals can now be found in the care plan section) Acute Rehab PT Goals Patient Stated Goal: Regain IND PT Goal Formulation: With patient Time For Goal Achievement: 03/23/20 Potential to Achieve Goals: Good Progress towards PT goals: Progressing toward goals     Frequency    Min 3X/week      PT Plan Current plan remains appropriate    Co-evaluation              AM-PAC PT "6 Clicks" Mobility   Outcome Measure  Help needed turning from your back to your side while in a flat bed without using bedrails?: A Lot Help needed moving from lying on your back to sitting on the side of a flat bed without using bedrails?: A Lot Help needed moving to and from a bed to a chair (including a wheelchair)?: A Lot Help needed standing up from a chair using your arms (e.g., wheelchair or bedside chair)?: A Lot Help needed to walk in hospital room?: Total Help needed climbing 3-5 steps with a railing? : Total 6 Click Score: 10    End of Session Equipment Utilized During Treatment: Gait belt Activity Tolerance: Patient limited by fatigue;Other (comment) Patient left: in bed;with call bell/phone within reach;with family/visitor present;with nursing/sitter in room Nurse Communication: Mobility status PT Visit Diagnosis: Difficulty in walking, not elsewhere classified (R26.2);History of falling (Z91.81);Unsteadiness on feet (R26.81);Pain Pain - Right/Left: Left Pain - part of body: Hip     Time: 4481-8563 PT Time Calculation (min) (ACUTE ONLY): 14 min  Charges:  $Therapeutic Exercise: 8-22 mins $Therapeutic Activity: 8-22 mins                     Debe Coder PT Acute Rehabilitation Services Pager [phone] Office [phone]    Alexia Dinger 03/10/2020, 4:00 PM

## 2020-03-10 NOTE — Progress Notes (Signed)
PROGRESS NOTE    John Walton  YTK:[id number] DOB: July 03, 1932 DOA: 03/07/2020 PCP: [name], MD     Brief Narrative:  86 yo WM PMHx HTN, HLD, glaucoma, RBBB, DMII, CKD Stage 4, noncompliance to meds   Presented after a mechanical fall at home.  Patient is accompanied by his wife in the ER who helps provide further collateral information.  He was going up the stairs and states that he missed a step and fell onto the floor.  This happened around 10 PM on Monday evening.  It took several hours for his wife and another family member to get him in the chair and back to bed.  Due to his ongoing left hip pain he finally presented for further evaluation.  He follows outpatient with a PCP per his wife, [name].  He also follows with the [place], [name]. No records are available for review.  Only past records in epic are from 2016.  He had a right bundle branch block at that time and was evaluated by cardiology with recommendations for undergoing a stress test at that time, however he declined and has not presented back since.  He also endorses that he does not take any routine medications at home.  He has lived a natural lifestyle and only uses a herbal substance to help with the neuropathic pains he is describing in his feet. He states that he controlled his diabetes with diet and exercise.  We talked about his uncontrolled blood pressure in the ER; he does endorse some pain however his wife states that his blood pressure has been elevated at office visits as well.  He does not take any medications for his blood pressure. During exam he was noted to have a rather large and noticeable abdominal mass.  It was not pulsating but when asked what it was, the patient could not elaborate and it appears has not been evaluated or at least they cannot tell me so. We also discussed his renal function.  They are aware that it is decreased but also could not further elaborate.  He does endorse that  he has difficulty voiding oftentimes and can take up to 30 minutes to initiate voiding.  On work-up in the ER regarding his fall he underwent imaging.  His wife does note that he had a small cyst on his right forehead that they knew about but after his fall and hitting his head he did have an enlarged amount of swelling on his right forehead. CT head was negative for intracranial process but did show a right frontal scalp hematoma.  He has a remote right frontal infarct noted as well.  Mild cerebral atrophy.  Right shoulder x-ray negative for acute abnormality.  CXR unremarkable, some atelectasis. CT left hip shows acute nondisplaced fracture of left femoral neck and intertrochanteric region.  Severe osteoarthritis of left hip joint. Also showed enlarged prostate gland.  He was evaluated by orthopedic surgery with tentative plans for operative repair and is admitted for further work-up and treatment.   I have personally briefly reviewed patient's old medical records in [facility] and discussed patient with the ER provider when     Subjective: 12/10 afebrile overnight A/O x4 , negative CP, negative nausea.  Positive LEFT leg pain has been sitting in the chair for approximately 3 hours.   Assessment & Plan: Covid vaccination; vaccinated   Principal Problem:   Femoral neck fracture ([place]) Active Problems:   Essential hypertension  Hyperlipidemia   RBBB   CKD (chronic kidney disease), stage IV (HCC)   Normocytic anemia   BPH (benign prostatic hyperplasia)   Abdominal mass   Scalp hematoma   Hypertensive urgency   Unilateral primary osteoarthritis, left hip   Bilateral hydronephrosis   LEFT femoral neck fracture  - s/p mechanical fall - CT hip shows acute nondisplaced fracture of left femoral neck and intertrochanteric region; severe OA left hip joint - seen by ortho; plans are for repair  12/8 - for now continue pain control  -12/8 patient is high risk surgically  given his multiple medical problems.  In addition patient is noncompliant with medication, and describes himself as a herbalist/naturalist.  I had a long talk with patient and wife prior to patient being taken down for repair of his hip and explained that he had multiple conditions which would need to be addressed, HTN, holosystolic murmur, DM type II, abdominal mass, CKD stage IV with bilateral hydronephrosis.  Unfortunately although patient at high risk surgically currently if LEFT femoral fracture not fixed patient will become bedbound which will lead to additional medical problems. -12/10 patient now understands that he must control his HTN and DM type II, and has been very compliant in taking medication during his hospitalization.  States he will continue to comply once discharge.  Essential HTN/HTN urgency -Amlodipine 10 mg daily -Hydralazine 25 mg PRN -Labetalol 10 mg PRN  New onset Holosystolic Murmur -Per patient and wife neither one knew of murmur  RBBB - saw Dr. Debara Pickett in 08/2014 and was offered a stress test due to new RBBB; patient declined and was to return in 3-4 months for follow up but did not do so - repeat EKG now: RBBB still present. No ischemia noted. No LVH - check TSH, A1c, Lipid -Echo; moderate LVH see results below   Abdominal mass - not pulsatile but u/k etiology at this time; patient nor wife are aware of significance or timeline of chronicity -abdominal ultrasound; see bilateral hydronephrosis  DM type II controlled with Hyperglycemia -12/7 Hemoglobin A1c= 6.7  -12/9 Moderate SSI -12/10 Lantus 8 units daily -12/10 DM coordinator consult; patient with need for DM teaching -12/10 DM nutrition consultpatient with need for DM nutrition teaching  HLD -12/10 LDL= 95.  Goal is LDL<70 -12/10 Lipitor 40 mg  BPH (benign prostatic hyperplasia) - palpable bladder on exam, enlarged prostate on CT, and patient reports difficulty with voiding at home - check bladder  scan, if >350 cc , place indwelling foley -Flomax 0.4 mg daily  CKD stage IV (baseline Cr~2.06) -multifactorial uncontrolled HTN, DM type II -12/2014 Cr 2.28 Lab Results  Component Value Date   CREATININE 2.05 (H) 03/10/2020   CREATININE 1.96 (H) 03/09/2020   CREATININE 2.12 (H) 03/08/2020   CREATININE 2.06 (H) 03/07/2020   CREATININE 1.77 (H) 04/23/2015  - creat in 12/2014 was 2.28 with GFR 25; currently creat is 2.06. Likely has stage IV due to untreated and uncontrolled HTN and/or DM -Patient with bilateral hydronephrosis on renal ultrasound --Strict in and out -1.2 L -KVO LR  Bilateral Hydronephrosis -12/8 per urology recommendation;  Renal stone CT completed.;  Does not appear that there is pathology between his kidneys and bladder, which urology was concerned about however have sent secure chat requesting that they review CT to ensure I am correctly interpreting findings.  No mention of hydronephrosis, however until urology clears will continue with catheter placement.,  Scalp hematoma - no intracranial process on CTH - no laceration  or bleeding - continue supportive care  Anemia of chronic disease -12/9 Anemia panel most consistent with chronic disease -Fecal occult pending Lab Results  Component Value Date   HGB 8.4 (L) 03/10/2020   HGB 9.4 (L) 03/09/2020   HGB 9.5 (L) 03/08/2020   HGB 11.5 (L) 03/07/2020   HGB 10.7 (L) 04/23/2015     Goals of care  -PT recommends SNF   DVT prophylaxis: Held secondary to surgery Code Status: Full Family Communication: 12/9 wife at bedside for discussion of plan of care answered all questions Status is: Inpatient    Dispo: The patient is from: Home              Anticipated d/c is to: SNF?              Anticipated d/c date is: 12/13              Patient currently unstable      Consultants:  Orthopedic surgery Urology   Procedures/Significant Events:  12/7 US abdomen complete;Post cholecystectomy. -Medical  renal disease changes of both kidneys with mild BILATERAL hydronephrosis of uncertain etiology. -Inadequate visualization of pancreas and aorta 12/9 Echocardiogram;LVEF =55 to 60%.  -moderate LVH Left ventricular diastolic parameters are indeterminate.  Aortic Valve: Mild to moderate aortic stenosis is present.   12/9 CT renal stone study; trace amount of gas in what appears to be a subtle cortical discontinuity along the anterior wall of the acetabulum which is not readily apparent on the hip CT from 03/07/2020. The possibility of a subtle anterior acetabular wall fracture in this vicinity is raised, although admittedly the finding is very localized/focal. -Marked prostatomegaly. 3. Other imaging findings of potential clinical significance: Low-density blood pool suggests anemia. 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 Severe osteoarthritis of the right hip. Expected postoperative findings in the tissue surrounding the left hip prosthesis. Multilevel lumbar spondylosis and degenerative disc disease causing mild degrees of foraminal impingement.    I have personally reviewed and interpreted all radiology studies and my findings are as above.  VENTILATOR SETTINGS:    Cultures   Antimicrobials: Anti-infectives (From admission, onward)   Start     Ordered Stop   03/08/20 0900  ceFAZolin (ANCEF) IVPB 2g/100 mL premix        03/08/20 0813 03/08/20 1505       Devices    LINES / TUBES:      Continuous Infusions: . lactated ringers 75 mL/hr at 03/09/20 0506     Objective: Vitals:   03/09/20 1724 03/09/20 2147 03/10/20 0555 03/10/20 1351  BP: (!) 127/51 (!) 121/48 121/63 (!) 112/48  Pulse: 100 100 99 (!) 103  Resp: 17 19 16 16   Temp: 98.1 F (36.7 C) 98.5 F (36.9 C) 97.7 F (36.5 C) 97.6 F (36.4 C)  TempSrc: Oral Oral Oral Oral  SpO2: 93% 92% 93% 92%  Weight:      Height:        Intake/Output Summary (Last 24 hours) at  03/10/2020 1408 Last data filed at 03/10/2020 1400 Gross per 24 hour  Intake 1020 ml  Output 1075 ml  Net -55 ml   Filed Weights   03/07/20 2123 03/08/20 1423  Weight: 62.8 kg 62.8 kg    Examination:  General: A/O x4, No acute respiratory distress Eyes: negative scleral hemorrhage, negative anisocoria, negative icterus ENT: Negative Runny nose, negative gingival bleeding, Neck:  Negative scars, masses, torticollis, lymphadenopathy, JVD Lungs: Clear to auscultation bilaterally without wheezes or  crackles Cardiovascular: Regular rate and rhythm without murmur gallop or rub normal S1 and S2 Abdomen: negative abdominal pain, nondistended, positive soft, bowel sounds, no rebound, no ascites, no appreciable mass Extremities: LEFT hip fracture currently in Buck's traction Skin: Negative rashes, lesions, ulcers Psychiatric:  Negative depression, negative anxiety, negative fatigue, negative mania patient and wife have [place] understanding of his multiple medical problems Central nervous system:  Cranial nerves II through XII intact, tongue/uvula midline, all extremities muscle strength 5/5, sensation intact throughout, negative dysarthria, negative expressive aphasia, negative receptive aphasia.  .     Data Reviewed: Care during the described time interval was provided by me .  I have reviewed this patient's available data, including medical history, events of note, physical examination, and all test results as part of my evaluation.  CBC: Recent Labs  Lab 03/07/20 1156 03/08/20 0633 03/09/20 0338 03/10/20 0356  WBC 12.3* 7.2 9.1 11.7*  NEUTROABS 10.6* 5.7 8.4* 9.6*  HGB 11.5* 9.5* 9.4* 8.4*  HCT 35.3* 29.1* 29.2* 26.0*  MCV 88.3 88.4 89.0 88.7  PLT 229 177 164 185   Basic Metabolic Panel: Recent Labs  Lab 03/07/20 1156 03/08/20 0633 03/09/20 0338 03/10/20 0356  NA 139 139 141 138  K 4.6 4.1 4.7 4.2  CL 109 110 110 108  CO2 19* 18* 19* 18*  GLUCOSE 164* 151*  210* 120*  BUN 38* 43* 47* 58*  CREATININE 2.06* 2.12* 1.96* 2.05*  CALCIUM 8.6* 7.8* 8.0* 7.6*  MG  --  2.1 2.1 2.1  PHOS  --  4.4  --   --    GFR: Estimated Creatinine Clearance: 23.4 mL/min (A) (by C-G formula based on SCr of 2.05 mg/dL (H)). Liver Function Tests: Recent Labs  Lab 03/07/20 1156 03/09/20 0338 03/10/20 0356  AST 23 27 25   ALT 15 14 13   ALKPHOS 92 64 60  BILITOT 0.6 0.7 0.6  PROT 7.8 6.8 6.3*  ALBUMIN 4.2 3.6 3.2*   No results for input(s): LIPASE, AMYLASE in the last 168 hours. No results for input(s): AMMONIA in the last 168 hours. Coagulation Profile: Recent Labs  Lab 03/07/20 1156  INR 1.1   Cardiac Enzymes: No results for input(s): CKTOTAL, CKMB, CKMBINDEX, TROPONINI in the last 168 hours. BNP (last 3 results) No results for input(s): PROBNP in the last 8760 hours. HbA1C: Recent Labs    03/07/20 1700  HGBA1C 6.7*   CBG: Recent Labs  Lab 03/09/20 2111 03/10/20 0008 03/10/20 0354 03/10/20 0741 03/10/20 1156  GLUCAP 160* 113* 113* 102* 101*   Lipid Profile: Recent Labs    03/09/20 0338 03/10/20 0356  CHOL 177 160  HDL 37* 37*  LDLCALC 115* 95  TRIG 124 141  CHOLHDL 4.8 4.3   Thyroid Function Tests: No results for input(s): TSH, T4TOTAL, FREET4, T3FREE, THYROIDAB in the last 72 hours. Anemia Panel: Recent Labs    03/09/20 0338  VITAMINB12 186  FOLATE 13.3  FERRITIN 77  TIBC 237*  IRON 19*  RETICCTPCT 1.4   Sepsis Labs: No results for input(s): PROCALCITON, LATICACIDVEN in the last 168 hours.  Recent Results (from the past 240 hour(s))  Resp Panel by RT-PCR (Flu A&B, Covid) Nasopharyngeal Swab     Status: None   Collection Time: 03/07/20 11:57 AM   Specimen: Nasopharyngeal Swab; Nasopharyngeal(NP) swabs in vial transport medium  Result Value Ref Range Status   SARS Coronavirus 2 by RT PCR NEGATIVE NEGATIVE Final    Comment: (NOTE) SARS-CoV-2 target nucleic acids are NOT  DETECTED.  The SARS-CoV-2 RNA is generally  detectable in upper respiratory specimens during the acute phase of infection. The lowest concentration of SARS-CoV-2 viral copies this assay can detect is 138 copies/mL. A negative result does not preclude SARS-Cov-2 infection and should not be used as the sole basis for treatment or other patient management decisions. A negative result may occur with  improper specimen collection/handling, submission of specimen other than nasopharyngeal swab, presence of viral mutation(s) within the areas targeted by this assay, and inadequate number of viral copies(<138 copies/mL). A negative result must be combined with clinical observations, patient history, and epidemiological information. The expected result is Negative.  Fact Sheet for Patients:  EntrepreneurPulse.com.au  Fact Sheet for Healthcare Providers:  IncredibleEmployment.be  This test is no t yet approved or cleared by the [place] FDA and  has been authorized for detection and/or diagnosis of SARS-CoV-2 by FDA under an Emergency Use Authorization (EUA). This EUA will remain  in effect (meaning this test can be used) for the duration of the COVID-19 declaration under Section 564(b)(1) of the Act, 21 U.S.C.section 360bbb-3(b)(1), unless the authorization is terminated  or revoked sooner.       Influenza A by PCR NEGATIVE NEGATIVE Final   Influenza B by PCR NEGATIVE NEGATIVE Final    Comment: (NOTE) The Xpert Xpress SARS-CoV-2/FLU/RSV plus assay is intended as an aid in the diagnosis of influenza from Nasopharyngeal swab specimens and should not be used as a sole basis for treatment. Nasal washings and aspirates are unacceptable for Xpert Xpress SARS-CoV-2/FLU/RSV testing.  Fact Sheet for Patients: EntrepreneurPulse.com.au  Fact Sheet for Healthcare Providers: IncredibleEmployment.be  This test is not yet approved or cleared by the [place] FDA  and has been authorized for detection and/or diagnosis of SARS-CoV-2 by FDA under an Emergency Use Authorization (EUA). This EUA will remain in effect (meaning this test can be used) for the duration of the COVID-19 declaration under Section 564(b)(1) of the Act, 21 U.S.C. section 360bbb-3(b)(1), unless the authorization is terminated or revoked.  Performed at [facility], [place] [address] [place], [place] [address]   Surgical PCR screen     Status: None   Collection Time: 03/08/20  9:29 AM   Specimen: Nasal Mucosa; Nasal Swab  Result Value Ref Range Status   MRSA, PCR NEGATIVE NEGATIVE Final   Staphylococcus aureus NEGATIVE NEGATIVE Final    Comment: (NOTE) The Xpert SA Assay (FDA approved for NASAL specimens in patients 37 years of age and older), is one component of a comprehensive surveillance program. It is not intended to diagnose infection nor to guide or monitor treatment. Performed at [facility], [place] [address], [place], [place] [address]          Radiology Studies: DG C-Arm 1-60 Min-No Report  Result Date: 03/08/2020 Fluoroscopy was utilized by the requesting physician.  No radiographic interpretation.   CT RENAL STONE STUDY  Result Date: 03/09/2020 CLINICAL DATA:  Hydronephrosis, chronic kidney disease EXAM: CT ABDOMEN AND PELVIS WITHOUT CONTRAST TECHNIQUE: Multidetector CT imaging of the abdomen and pelvis was performed following the standard protocol without IV contrast. COMPARISON:  Abdominal ultrasound 03/07/2020 FINDINGS: Lower chest: Bandlike densities in the lung bases more concentrated in the right lower lobe, favoring atelectasis or scarring. Descending thoracic aortic atherosclerosis. Low-density blood pool suggests anemia. Hepatobiliary: Cholecystectomy. Pancreas: Unremarkable Spleen: Unremarkable Adrenals/Urinary Tract: 1.5 by 1.6 cm hypodense lesion of the left kidney upper pole, internal density 12 Hounsfield units  on image  27 series 2, probably a cyst but technically nonspecific on today's noncontrast examination. Adrenal glands normal. Exophytic 1.3 by 1.3 cm lesion from the left kidney lower pole, internal density 10 Hounsfield units on image 43/2, probably a cyst but technically nonspecific. A Foley catheter is present in the urinary bladder which is nondistended but thick walled. Small amount of gas in the urinary bladder. Stomach/Bowel: Large periampullary duodenal diverticulum without findings of inflammation. Vascular/Lymphatic: Aortoiliac atherosclerotic vascular disease. Reproductive: Marked prostatomegaly. Other: No supplemental non-categorized findings. Musculoskeletal: There is abnormal gas tracking along the anterior margin of the left iliopsoas muscle down towards the insertion, and also along the left gluteus musculature and left upper thigh musculature anteriorly, probably related to the patient's left hip surgery this afternoon. Indistinctness of surrounding fascia planes, likely postoperative. Overall the amount of gas and edema in this vicinity is consistent with today's earlier operation. On images 69 and 70 of series 2, there is a trace amount of gas in what appears to be a subtle cortical discontinuity along the anterior wall of the acetabulum which is not readily apparent on the CT from 03/07/2020. The possibility of a subtle fracture in this vicinity is raised, although admittedly the finding is very localized/focal. Severe osteoarthritis of the right hip. Multilevel lumbar spondylosis and degenerative disc disease causing mild degrees of foraminal impingement. IMPRESSION: 1. There is a trace amount of gas in what appears to be a subtle cortical discontinuity along the anterior wall of the acetabulum which is not readily apparent on the hip CT from 03/07/2020. The possibility of a subtle anterior acetabular wall fracture in this vicinity is raised, although admittedly the finding is very  localized/focal. 2. Marked prostatomegaly. 3. Other imaging findings of potential clinical significance: Low-density blood pool suggests anemia. 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 Severe osteoarthritis of the right hip. Expected postoperative findings in the tissue surrounding the left hip prosthesis. Multilevel lumbar spondylosis and degenerative disc disease causing mild degrees of foraminal impingement. 4. Aortic atherosclerosis. Aortic Atherosclerosis (ICD10-I70.0). Electronically Signed   By: Van Clines M.D.   On: 03/09/2020 14:28   DG Hip Port Unilat With Pelvis 1V Left  Result Date: 03/08/2020 CLINICAL DATA:  86 year old male undergoing hip arthroplasty after traumatic fracture. EXAM: DG HIP (WITH OR WITHOUT PELVIS) 1V PORT LEFT COMPARISON:  Intraoperative images 16 35 hours today. FINDINGS: Portable AP view at 1715 hours. Left hip bipolar arthroplasty demonstrated on this slightly oblique view. Hardware appears intact and normally aligned. Regional postoperative soft tissue gas. Overlying skin staples. No unexpected osseous changes. IMPRESSION: Mildly oblique view of bipolar left hip arthroplasty with no adverse features. Electronically Signed   By: Genevie Ann M.D.   On: 03/08/2020 19:32   DG HIP OPERATIVE UNILAT W OR W/O PELVIS LEFT  Result Date: 03/08/2020 CLINICAL DATA:  86 year old male undergoing hip arthroplasty after traumatic fracture. EXAM: OPERATIVE LEFT HIP (WITH PELVIS IF PERFORMED) 2 VIEWS TECHNIQUE: Fluoroscopic spot image(s) were submitted for interpretation post-operatively. COMPARISON:  LEFT HIP CT 03/07/2020. FINDINGS: Two intraoperative fluoroscopic AP spot views of the left hip demonstrate bipolar arthroplasty underway. No unexpected osseous changes are visible. FLUOROSCOPY TIME:  0 minutes 2 seconds IMPRESSION: Left hip arthroplasty underway. Electronically Signed   By: Genevie Ann M.D.   On: 03/08/2020 19:31        Scheduled  Meds: . amLODipine  10 mg Oral Daily  . aspirin EC  325 mg Oral Q breakfast  . Chlorhexidine Gluconate Cloth  6 each  Topical Daily  . docusate sodium  100 mg Oral BID  . insulin aspart  0-15 Units Subcutaneous TID AC & HS  . sodium chloride flush  3 mL Intravenous Q12H  . tamsulosin  0.4 mg Oral QPC supper   Continuous Infusions: . lactated ringers 75 mL/hr at 03/09/20 0506     LOS: 3 days    Time spent:40 min    Marcile Fuquay, Geraldo Docker, MD [facility] Pager [phone]  If 7PM-7AM, please contact night-coverage [email or web address] Password [email or web address] 03/10/2020, 2:08 PM

## 2020-03-11 LAB — CBC WITH DIFFERENTIAL/PLATELET
Abs Immature Granulocytes: 0.08 10*3/uL — ABNORMAL HIGH (ref 0.00–0.07)
Basophils Absolute: 0 10*3/uL (ref 0.0–0.1)
Basophils Relative: 0 %
Eosinophils Absolute: 0.1 10*3/uL (ref 0.0–0.5)
Eosinophils Relative: 2 %
HCT: 23.3 % — ABNORMAL LOW (ref 39.0–52.0)
Hemoglobin: 7.5 g/dL — ABNORMAL LOW (ref 13.0–17.0)
Immature Granulocytes: 1 %
Lymphocytes Relative: 7 %
Lymphs Abs: 0.5 10*3/uL — ABNORMAL LOW (ref 0.7–4.0)
MCH: 28.6 pg (ref 26.0–34.0)
MCHC: 32.2 g/dL (ref 30.0–36.0)
MCV: 88.9 fL (ref 80.0–100.0)
Monocytes Absolute: 0.9 10*3/uL (ref 0.1–1.0)
Monocytes Relative: 12 %
Neutro Abs: 5.9 10*3/uL (ref 1.7–7.7)
Neutrophils Relative %: 78 %
Platelets: 179 10*3/uL (ref 150–400)
RBC: 2.62 MIL/uL — ABNORMAL LOW (ref 4.22–5.81)
RDW: 13 % (ref 11.5–15.5)
WBC: 7.6 10*3/uL (ref 4.0–10.5)
nRBC: 0 % (ref 0.0–0.2)

## 2020-03-11 LAB — GLUCOSE, CAPILLARY
Glucose-Capillary: 104 mg/dL — ABNORMAL HIGH (ref 70–99)
Glucose-Capillary: 118 mg/dL — ABNORMAL HIGH (ref 70–99)
Glucose-Capillary: 123 mg/dL — ABNORMAL HIGH (ref 70–99)
Glucose-Capillary: 132 mg/dL — ABNORMAL HIGH (ref 70–99)
Glucose-Capillary: 90 mg/dL (ref 70–99)

## 2020-03-11 LAB — COMPREHENSIVE METABOLIC PANEL
ALT: 12 U/L (ref 0–44)
AST: 24 U/L (ref 15–41)
Albumin: 2.8 g/dL — ABNORMAL LOW (ref 3.5–5.0)
Alkaline Phosphatase: 57 U/L (ref 38–126)
Anion gap: 11 (ref 5–15)
BUN: 71 mg/dL — ABNORMAL HIGH (ref 8–23)
CO2: 18 mmol/L — ABNORMAL LOW (ref 22–32)
Calcium: 7.4 mg/dL — ABNORMAL LOW (ref 8.9–10.3)
Chloride: 108 mmol/L (ref 98–111)
Creatinine, Ser: 2.22 mg/dL — ABNORMAL HIGH (ref 0.61–1.24)
GFR, Estimated: 28 mL/min — ABNORMAL LOW (ref >60)
Glucose, Bld: 103 mg/dL — ABNORMAL HIGH (ref 70–99)
Potassium: 3.9 mmol/L (ref 3.5–5.1)
Sodium: 137 mmol/L (ref 135–145)
Total Bilirubin: 0.9 mg/dL (ref 0.3–1.2)
Total Protein: 5.8 g/dL — ABNORMAL LOW (ref 6.5–8.1)

## 2020-03-11 LAB — HAPTOGLOBIN: Haptoglobin: 199 mg/dL (ref 38–329)

## 2020-03-11 LAB — MAGNESIUM: Magnesium: 2.2 mg/dL (ref 1.7–2.4)

## 2020-03-11 MED ORDER — AMLODIPINE BESYLATE 5 MG PO TABS
5.0000 mg | ORAL_TABLET | Freq: Every day | ORAL | Status: DC
  Administered 2020-03-13: 5 mg via ORAL
  Filled 2020-03-11 (×2): qty 1

## 2020-03-11 MED ORDER — POLYVINYL ALCOHOL 1.4 % OP SOLN
1.0000 [drp] | Freq: Three times a day (TID) | OPHTHALMIC | Status: DC | PRN
  Filled 2020-03-11: qty 15

## 2020-03-11 MED ORDER — LATANOPROST 0.005 % OP SOLN
1.0000 [drp] | Freq: Every day | OPHTHALMIC | Status: DC
  Administered 2020-03-11 – 2020-03-13 (×3): 1 [drp] via OPHTHALMIC
  Filled 2020-03-11: qty 2.5

## 2020-03-11 MED ORDER — INSULIN ASPART 100 UNIT/ML ~~LOC~~ SOLN
0.0000 [IU] | SUBCUTANEOUS | Status: DC
  Administered 2020-03-11 – 2020-03-13 (×3): 1 [IU] via SUBCUTANEOUS
  Administered 2020-03-13: 3 [IU] via SUBCUTANEOUS

## 2020-03-11 NOTE — Progress Notes (Signed)
  Subjective: Patient stable but still is having a lot of pain even with sitting up.   Objective: Vital signs in last 24 hours: Temp:  [97.6 F (36.4 C)-98.2 F (36.8 C)] 98.2 F (36.8 C) (12/11 0504) Pulse Rate:  [86-103] 101 (12/11 0504) Resp:  [14-16] 16 (12/11 0504) BP: (111-114)/(48-56) 111/51 (12/11 0504) SpO2:  [92 %-93 %] 93 % (12/11 0504)  Intake/Output from previous day: 12/10 0701 - 12/11 0700 In: 420 [P.O.:420] Out: 300 [Urine:300] Intake/Output this shift: No intake/output data recorded.  Exam:  Sensation intact distally  Labs: Recent Labs    03/09/20 0338 03/10/20 0356 03/11/20 0332  HGB 9.4* 8.4* 7.5*   Recent Labs    03/10/20 0356 03/11/20 0332  WBC 11.7* 7.6  RBC 2.93* 2.62*  HCT 26.0* 23.3*  PLT 183 179   Recent Labs    03/10/20 0356 03/11/20 0332  NA 138 137  K 4.2 3.9  CL 108 108  CO2 18* 18*  BUN 58* 71*  CREATININE 2.05* 2.22*  GLUCOSE 120* 103*  CALCIUM 7.6* 7.4*   No results for input(s): LABPT, INR in the last 72 hours.  Assessment/Plan: Plan at this time is discharged to skilled nursing once bed becomes available.  No ongoing orthopedic issues at this time.   John Walton 03/11/2020, 9:21 AM

## 2020-03-11 NOTE — Progress Notes (Signed)
PROGRESS NOTE    John Walton  YNW:[id number] DOB: 27-Jan-1933 DOA: 03/07/2020 PCP: [name], MD     Brief Narrative:  86 yo WM PMHx HTN, HLD, glaucoma, RBBB, DMII, CKD Stage 4, noncompliance to meds   Presented after a mechanical fall at home.  Patient is accompanied by his wife in the ER who helps provide further collateral information.  He was going up the stairs and states that he missed a step and fell onto the floor.  This happened around 10 PM on Monday evening.  It took several hours for his wife and another family member to get him in the chair and back to bed.  Due to his ongoing left hip pain he finally presented for further evaluation.  He follows outpatient with a PCP per his wife, [name].  He also follows with the [place], [name]. No records are available for review.  Only past records in epic are from 2016.  He had a right bundle branch block at that time and was evaluated by cardiology with recommendations for undergoing a stress test at that time, however he declined and has not presented back since.  He also endorses that he does not take any routine medications at home.  He has lived a natural lifestyle and only uses a herbal substance to help with the neuropathic pains he is describing in his feet. He states that he controlled his diabetes with diet and exercise.  We talked about his uncontrolled blood pressure in the ER; he does endorse some pain however his wife states that his blood pressure has been elevated at office visits as well.  He does not take any medications for his blood pressure. During exam he was noted to have a rather large and noticeable abdominal mass.  It was not pulsating but when asked what it was, the patient could not elaborate and it appears has not been evaluated or at least they cannot tell me so. We also discussed his renal function.  They are aware that it is decreased but also could not further elaborate.  He does endorse that  he has difficulty voiding oftentimes and can take up to 30 minutes to initiate voiding.  On work-up in the ER regarding his fall he underwent imaging.  His wife does note that he had a small cyst on his right forehead that they knew about but after his fall and hitting his head he did have an enlarged amount of swelling on his right forehead. CT head was negative for intracranial process but did show a right frontal scalp hematoma.  He has a remote right frontal infarct noted as well.  Mild cerebral atrophy.  Right shoulder x-ray negative for acute abnormality.  CXR unremarkable, some atelectasis. CT left hip shows acute nondisplaced fracture of left femoral neck and intertrochanteric region.  Severe osteoarthritis of left hip joint. Also showed enlarged prostate gland.  He was evaluated by orthopedic surgery with tentative plans for operative repair and is admitted for further work-up and treatment.   I have personally briefly reviewed patient's old medical records in [facility] and discussed patient with the ER provider when     Subjective: 12/11 overnight.  A/O x4.  Observed patient working with physical therapy ambulated approximately 5 m before needing to return to bed.   Assessment & Plan: Covid vaccination; vaccinated   Principal Problem:   Femoral neck fracture ([place]) Active Problems:   Essential hypertension   Hyperlipidemia  RBBB   CKD (chronic kidney disease), stage IV (HCC)   Normocytic anemia   BPH (benign prostatic hyperplasia)   Abdominal mass   Scalp hematoma   Hypertensive urgency   Unilateral primary osteoarthritis, left hip   Bilateral hydronephrosis   LEFT femoral neck fracture  - s/p mechanical fall - CT hip shows acute nondisplaced fracture of left femoral neck and intertrochanteric region; severe OA left hip joint - seen by ortho; plans are for repair  12/8 - for now continue pain control  -12/8 patient is high risk surgically given his  multiple medical problems.  In addition patient is noncompliant with medication, and describes himself as a herbalist/naturalist.  I had a long talk with patient and wife prior to patient being taken down for repair of his hip and explained that he had multiple conditions which would need to be addressed, HTN, holosystolic murmur, DM type II, abdominal mass, CKD stage IV with bilateral hydronephrosis.  Unfortunately although patient at high risk surgically currently if LEFT femoral fracture not fixed patient will become bedbound which will lead to additional medical problems. -12/10 patient now understands that he must control his HTN and DM type II, and has been very compliant in taking medication during his hospitalization.  States he will continue to comply once discharge.  Essential HTN/HTN urgency -12/11 decrease Amlodipine 5 mg daily patient orthostatic hypotensive  -Hydralazine 25 mg PRN -Labetalol 10 mg PRN  New onset Holosystolic Murmur -Per patient and wife neither one knew of murmur  RBBB - saw Dr. Debara Pickett in 08/2014 and was offered a stress test due to new RBBB; patient declined and was to return in 3-4 months for follow up but did not do so - repeat EKG now: RBBB still present. No ischemia noted. No LVH - check TSH, A1c, Lipid -Echo; moderate LVH see results below   Abdominal mass - not pulsatile but u/k etiology at this time; patient nor wife are aware of significance or timeline of chronicity -abdominal ultrasound; see bilateral hydronephrosis  DM type II controlled with Hyperglycemia -12/7 Hemoglobin A1c= 6.7  -12/11 decrease sensitive SSI  -12/11Lantus was discontinued at some point.   -12/10 DM coordinator consult; patient with need for DM teaching -12/10 DM nutrition consultpatient with need for DM nutrition teaching  HLD -12/10 LDL= 95.  Goal is LDL<70 -12/10 Lipitor 40 mg  BPH (benign prostatic hyperplasia) - palpable bladder on exam, enlarged prostate on CT,  and patient reports difficulty with voiding at home - check bladder scan, if >350 cc , place indwelling foley -Flomax 0.4 mg daily  CKD stage IV (baseline Cr~2.06) -multifactorial uncontrolled HTN, DM type II -12/2014 Cr 2.28 Lab Results  Component Value Date   CREATININE 2.22 (H) 03/11/2020   CREATININE 2.05 (H) 03/10/2020   CREATININE 1.96 (H) 03/09/2020   CREATININE 2.12 (H) 03/08/2020   CREATININE 2.06 (H) 03/07/2020  - creat in 12/2014 was 2.28 with GFR 25; currently creat is 2.06. Likely has stage IV due to untreated and uncontrolled HTN and/or DM -Patient with bilateral hydronephrosis on renal ultrasound --Strict in and out -948.1ml -KVO LR  Bilateral Hydronephrosis -12/8 per urology recommendation;  Renal stone CT completed.;  Does not appear that there is pathology between his kidneys and bladder, which urology was concerned about however have sent secure chat requesting that they review CT to ensure I am correctly interpreting findings.  No mention of hydronephrosis, however until urology clears will continue with catheter placement., -Per Dr. Franchot Gallo urology;  needs to go w/ catheter and needs followup in office for TOV. If he can tolerate it I would recommend tamsulosin.   Scalp hematoma  - no intracranial process on CTH - no laceration or bleeding - continue supportive care  Anemia of chronic disease -12/9 Anemia panel most consistent with chronic disease -Fecal occult pending Lab Results  Component Value Date   HGB 7.5 (L) 03/11/2020   HGB 8.4 (L) 03/10/2020   HGB 9.4 (L) 03/09/2020   HGB 9.5 (L) 03/08/2020   HGB 11.5 (L) 03/07/2020     Goals of care  -PT recommends SNF   DVT prophylaxis: Held secondary to surgery Code Status: Full Family Communication: 12/9 wife at bedside for discussion of plan of care answered all questions Status is: Inpatient    Dispo: The patient is from: Home              Anticipated d/c is to: SNF?               Anticipated d/c date is: 12/13              Patient currently unstable      Consultants:  Orthopedic surgery Urology Dr. Franchot Gallo   Procedures/Significant Events:  12/7 US abdomen complete;Post cholecystectomy. -Medical renal disease changes of both kidneys with mild BILATERAL hydronephrosis of uncertain etiology. -Inadequate visualization of pancreas and aorta 12/9 Echocardiogram;LVEF =55 to 60%.  -moderate LVH Left ventricular diastolic parameters are indeterminate.  Aortic Valve: Mild to moderate aortic stenosis is present.   12/9 CT renal stone study; trace amount of gas in what appears to be a subtle cortical discontinuity along the anterior wall of the acetabulum which is not readily apparent on the hip CT from 03/07/2020. The possibility of a subtle anterior acetabular wall fracture in this vicinity is raised, although admittedly the finding is very localized/focal. -Marked prostatomegaly. 3. Other imaging findings of potential clinical significance: Low-density blood pool suggests anemia. 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 Severe osteoarthritis of the right hip. Expected postoperative findings in the tissue surrounding the left hip prosthesis. Multilevel lumbar spondylosis and degenerative disc disease causing mild degrees of foraminal impingement.    I have personally reviewed and interpreted all radiology studies and my findings are as above.  VENTILATOR SETTINGS:    Cultures   Antimicrobials: Anti-infectives (From admission, onward)   Start     Ordered Stop   03/08/20 0900  ceFAZolin (ANCEF) IVPB 2g/100 mL premix        03/08/20 0813 03/08/20 1505       Devices    LINES / TUBES:      Continuous Infusions: . lactated ringers Stopped (03/10/20 1825)     Objective: Vitals:   03/10/20 1351 03/10/20 2043 03/11/20 0504 03/11/20 1410  BP: (!) 112/48 (!) 114/56 (!) 111/51 (!) 126/59  Pulse: (!)  103 86 (!) 101 99  Resp: 16 14 16 19   Temp: 97.6 F (36.4 C) 98.2 F (36.8 C) 98.2 F (36.8 C) 98.1 F (36.7 C)  TempSrc: Oral Oral Oral Oral  SpO2: 92% 92% 93% 95%  Weight:      Height:        Intake/Output Summary (Last 24 hours) at 03/11/2020 1506 Last data filed at 03/11/2020 1330 Gross per 24 hour  Intake 480 ml  Output 400 ml  Net 80 ml   Filed Weights   03/07/20 2123 03/08/20 1423  Weight: 62.8 kg 62.8 kg    Examination:  General: A/O x4, No acute respiratory distress Eyes: negative scleral hemorrhage, negative anisocoria, negative icterus ENT: Negative Runny nose, negative gingival bleeding, Neck:  Negative scars, masses, torticollis, lymphadenopathy, JVD Lungs: Clear to auscultation bilaterally without wheezes or crackles Cardiovascular: Regular rate and rhythm without murmur gallop or rub normal S1 and S2 Abdomen: negative abdominal pain, nondistended, positive soft, bowel sounds, no rebound, no ascites, no appreciable mass Extremities: LEFT hip fracture currently in Buck's traction Skin: Negative rashes, lesions, ulcers Psychiatric:  Negative depression, negative anxiety, negative fatigue, negative mania patient and wife have [place] understanding of his multiple medical problems Central nervous system:  Cranial nerves II through XII intact, tongue/uvula midline, all extremities muscle strength 5/5, sensation intact throughout, negative dysarthria, negative expressive aphasia, negative receptive aphasia.  .     Data Reviewed: Care during the described time interval was provided by me .  I have reviewed this patient's available data, including medical history, events of note, physical examination, and all test results as part of my evaluation.  CBC: Recent Labs  Lab 03/07/20 1156 03/08/20 0633 03/09/20 0338 03/10/20 0356 03/11/20 0332  WBC 12.3* 7.2 9.1 11.7* 7.6  NEUTROABS 10.6* 5.7 8.4* 9.6* 5.9  HGB 11.5* 9.5* 9.4* 8.4* 7.5*  HCT 35.3* 29.1*  29.2* 26.0* 23.3*  MCV 88.3 88.4 89.0 88.7 88.9  PLT 229 177 164 183 397   Basic Metabolic Panel: Recent Labs  Lab 03/07/20 1156 03/08/20 0633 03/09/20 0338 03/10/20 0356 03/11/20 0332  NA 139 139 141 138 137  K 4.6 4.1 4.7 4.2 3.9  CL 109 110 110 108 108  CO2 19* 18* 19* 18* 18*  GLUCOSE 164* 151* 210* 120* 103*  BUN 38* 43* 47* 58* 71*  CREATININE 2.06* 2.12* 1.96* 2.05* 2.22*  CALCIUM 8.6* 7.8* 8.0* 7.6* 7.4*  MG  --  2.1 2.1 2.1 2.2  PHOS  --  4.4  --   --   --    GFR: Estimated Creatinine Clearance: 21.6 mL/min (A) (by C-G formula based on SCr of 2.22 mg/dL (H)). Liver Function Tests: Recent Labs  Lab 03/07/20 1156 03/09/20 0338 03/10/20 0356 03/11/20 0332  AST 23 27 25 24   ALT 15 14 13 12   ALKPHOS 92 64 60 57  BILITOT 0.6 0.7 0.6 0.9  PROT 7.8 6.8 6.3* 5.8*  ALBUMIN 4.2 3.6 3.2* 2.8*   No results for input(s): LIPASE, AMYLASE in the last 168 hours. No results for input(s): AMMONIA in the last 168 hours. Coagulation Profile: Recent Labs  Lab 03/07/20 1156  INR 1.1   Cardiac Enzymes: No results for input(s): CKTOTAL, CKMB, CKMBINDEX, TROPONINI in the last 168 hours. BNP (last 3 results) No results for input(s): PROBNP in the last 8760 hours. HbA1C: No results for input(s): HGBA1C in the last 72 hours. CBG: Recent Labs  Lab 03/10/20 1156 03/10/20 1542 03/10/20 2124 03/11/20 0735 03/11/20 1240  GLUCAP 101* 118* 113* 90 132*   Lipid Profile: Recent Labs    03/09/20 0338 03/10/20 0356  CHOL 177 160  HDL 37* 37*  LDLCALC 115* 95  TRIG 124 141  CHOLHDL 4.8 4.3   Thyroid Function Tests: No results for input(s): TSH, T4TOTAL, FREET4, T3FREE, THYROIDAB in the last 72 hours. Anemia Panel: Recent Labs    03/09/20 0338  VITAMINB12 186  FOLATE 13.3  FERRITIN 77  TIBC 237*  IRON 19*  RETICCTPCT 1.4   Sepsis Labs: No results for input(s): PROCALCITON, LATICACIDVEN in the last 168 hours.  Recent Results (from  the past 240 hour(s))  Resp  Panel by RT-PCR (Flu A&B, Covid) Nasopharyngeal Swab     Status: None   Collection Time: 03/07/20 11:57 AM   Specimen: Nasopharyngeal Swab; Nasopharyngeal(NP) swabs in vial transport medium  Result Value Ref Range Status   SARS Coronavirus 2 by RT PCR NEGATIVE NEGATIVE Final    Comment: (NOTE) SARS-CoV-2 target nucleic acids are NOT DETECTED.  The SARS-CoV-2 RNA is generally detectable in upper respiratory specimens during the acute phase of infection. The lowest concentration of SARS-CoV-2 viral copies this assay can detect is 138 copies/mL. A negative result does not preclude SARS-Cov-2 infection and should not be used as the sole basis for treatment or other patient management decisions. A negative result may occur with  improper specimen collection/handling, submission of specimen other than nasopharyngeal swab, presence of viral mutation(s) within the areas targeted by this assay, and inadequate number of viral copies(<138 copies/mL). A negative result must be combined with clinical observations, patient history, and epidemiological information. The expected result is Negative.  Fact Sheet for Patients:  EntrepreneurPulse.com.au  Fact Sheet for Healthcare Providers:  IncredibleEmployment.be  This test is no t yet approved or cleared by the [place] FDA and  has been authorized for detection and/or diagnosis of SARS-CoV-2 by FDA under an Emergency Use Authorization (EUA). This EUA will remain  in effect (meaning this test can be used) for the duration of the COVID-19 declaration under Section 564(b)(1) of the Act, 21 U.S.C.section 360bbb-3(b)(1), unless the authorization is terminated  or revoked sooner.       Influenza A by PCR NEGATIVE NEGATIVE Final   Influenza B by PCR NEGATIVE NEGATIVE Final    Comment: (NOTE) The Xpert Xpress SARS-CoV-2/FLU/RSV plus assay is intended as an aid in the diagnosis of influenza from Nasopharyngeal  swab specimens and should not be used as a sole basis for treatment. Nasal washings and aspirates are unacceptable for Xpert Xpress SARS-CoV-2/FLU/RSV testing.  Fact Sheet for Patients: EntrepreneurPulse.com.au  Fact Sheet for Healthcare Providers: IncredibleEmployment.be  This test is not yet approved or cleared by the [place] FDA and has been authorized for detection and/or diagnosis of SARS-CoV-2 by FDA under an Emergency Use Authorization (EUA). This EUA will remain in effect (meaning this test can be used) for the duration of the COVID-19 declaration under Section 564(b)(1) of the Act, 21 U.S.C. section 360bbb-3(b)(1), unless the authorization is terminated or revoked.  Performed at [facility], [place] [address], [place], [place] [address]   Surgical PCR screen     Status: None   Collection Time: 03/08/20  9:29 AM   Specimen: Nasal Mucosa; Nasal Swab  Result Value Ref Range Status   MRSA, PCR NEGATIVE NEGATIVE Final   Staphylococcus aureus NEGATIVE NEGATIVE Final    Comment: (NOTE) The Xpert SA Assay (FDA approved for NASAL specimens in patients 16 years of age and older), is one component of a comprehensive surveillance program. It is not intended to diagnose infection nor to guide or monitor treatment. Performed at [facility], [place] [address], [place], [place] [address]          Radiology Studies: No results found.      Scheduled Meds: . amLODipine  10 mg Oral Daily  . aspirin EC  325 mg Oral Q breakfast  . atorvastatin  40 mg Oral Daily  . Chlorhexidine Gluconate Cloth  6 each Topical Daily  . docusate sodium  100 mg Oral BID  . insulin aspart  0-15 Units Subcutaneous TID AC & HS  . latanoprost  1 drop Both Eyes QHS  . sodium chloride flush  3 mL Intravenous Q12H  . tamsulosin  0.4 mg Oral QPC supper   Continuous Infusions: . lactated ringers Stopped (03/10/20 1825)      LOS: 4 days    Time spent:40 min    Lunell Robart, Geraldo Docker, MD [facility] Pager [phone]  If 7PM-7AM, please contact night-coverage [email or web address] Password [facility] 03/11/2020, 3:06 PM

## 2020-03-11 NOTE — Progress Notes (Signed)
Physical Therapy Treatment Patient Details Name: John Walton MRN: [id number] DOB: 06-Sep-1932 Today's Date: 03/11/2020    History of Present Illness [name]    PT Comments    Pt in better spirits and motivated to progress.  Pt able to ambulate for first time this pm and with decreased assist for all tasks.  Pt denies dizziness but BP in supine 122/55; sit 101/54; stand 103/48; after ambulating short distance 94/46 - physician present during session.  Follow Up Recommendations  SNF     Equipment Recommendations  None recommended by PT    Recommendations for Other Services OT consult     Precautions / Restrictions Precautions Precautions: Fall;Posterior Hip Precaution Comments: Pt recalls no THP - all precautions reviewed with pt and spouse Required Braces or Orthoses: Knee Immobilizer - Left Knee Immobilizer - Left: On at all times Restrictions Weight Bearing Restrictions: No LLE Weight Bearing: Weight bearing as tolerated Other Position/Activity Restrictions: WBAT    Mobility  Bed Mobility Overal bed mobility: Needs Assistance Bed Mobility: Supine to Sit     Supine to sit: Mod assist;+2 for safety/equipment;+2 for physical assistance;HOB elevated     General bed mobility comments: cues for sequence and use of R LE to self assist.  Physical assist to manage LEs and to bring trunk to upright and complete rotation to EOB sitting with pad  Transfers Overall transfer level: Needs assistance Equipment used: Rolling walker (2 wheeled) Transfers: Sit to/from [facility] Sit to Stand: Mod assist;+2 physical assistance;+2 safety/equipment;From elevated surface         General transfer comment: cues for LE management, adherence to THP, and use of UEs to self assist.   Physical assist required to bring wt up and fwd and to balance in standing  Ambulation/Gait Ambulation/Gait assistance: Mod  assist;+2 physical assistance;+2 safety/equipment Gait Distance (Feet): 7 Feet Assistive device: Rolling walker (2 wheeled) Gait Pattern/deviations: Step-to pattern;Decreased step length - right;Decreased step length - left;Shuffle;Trunk flexed;Antalgic Gait velocity: decr   General Gait Details: cues for sequence, posture and position from [employer]             Wheelchair Mobility    Modified Rankin (Stroke Patients Only)       Balance Overall balance assessment: Needs assistance Sitting-balance support: No upper extremity supported;Feet supported Sitting balance-Leahy Scale: Fair     Standing balance support: Bilateral upper extremity supported Standing balance-Leahy Scale: Poor                              Cognition Arousal/Alertness: Awake/alert Behavior During Therapy: Flat affect Overall Cognitive Status: Within Functional Limits for tasks assessed                                 General Comments: Very talkative and easily distracted - by self      Exercises      General Comments        Pertinent Vitals/Pain Pain Assessment: Faces Faces Pain Scale: Hurts a little bit Pain Location: L hip with movement Pain Descriptors / Indicators: Aching;Sore Pain Intervention(s): Limited activity within patient's tolerance;Monitored during session;Premedicated before session;Ice applied    Home Living                      Prior  Function            PT Goals (current goals can now be found in the care plan section) Acute Rehab PT Goals Patient Stated Goal: Regain IND PT Goal Formulation: With patient Time For Goal Achievement: 03/23/20 Potential to Achieve Goals: Good Progress towards PT goals: Progressing toward goals    Frequency    Min 3X/week      PT Plan Current plan remains appropriate    Co-evaluation              AM-PAC PT "6 Clicks" Mobility   Outcome Measure  Help needed turning from your  back to your side while in a flat bed without using bedrails?: A Lot Help needed moving from lying on your back to sitting on the side of a flat bed without using bedrails?: A Lot Help needed moving to and from a bed to a chair (including a wheelchair)?: A Lot Help needed standing up from a chair using your arms (e.g., wheelchair or bedside chair)?: A Lot Help needed to walk in hospital room?: A Lot Help needed climbing 3-5 steps with a railing? : Total 6 Click Score: 11    End of Session Equipment Utilized During Treatment: Gait belt Activity Tolerance: Patient tolerated treatment well Patient left: in chair;with call bell/phone within reach;with chair alarm set;with family/visitor present Nurse Communication: Mobility status PT Visit Diagnosis: Difficulty in walking, not elsewhere classified (R26.2);History of falling (Z91.81);Unsteadiness on feet (R26.81);Pain Pain - Right/Left: Left Pain - part of body: Hip     Time: 9211-9417 PT Time Calculation (min) (ACUTE ONLY): 23 min  Charges:  $Gait Training: 8-22 mins $Therapeutic Activity: 8-22 mins                     [place] Pager [phone] Office [phone]    Homer Miller 03/11/2020, 4:17 PM

## 2020-03-11 NOTE — Plan of Care (Signed)
Plan of care reviewed and discussed with the patient. 

## 2020-03-11 NOTE — Progress Notes (Signed)
Physical Therapy Treatment Patient Details Name: John Walton MRN: [id number] DOB: Apr 25, 1932 Today's Date: 03/11/2020    History of Present Illness [name]    PT Comments    Pt performed therex program with assist.  OOB deferred pending Dr arrival - pt with Hgb at 7.5 and orthostatic with last attempts to mobilize OOB.   Follow Up Recommendations  SNF     Equipment Recommendations  None recommended by PT    Recommendations for Other Services OT consult     Precautions / Restrictions Precautions Precautions: Fall;Posterior Hip Precaution Comments: Pt recalls no THP - all precautions reviewed with pt and spouse Required Braces or Orthoses: Knee Immobilizer - Left Knee Immobilizer - Left: On at all times Restrictions Weight Bearing Restrictions: No LLE Weight Bearing: Weight bearing as tolerated Other Position/Activity Restrictions: WBAT    Mobility  Bed Mobility               General bed mobility comments: OOB deferred - possible dc  Transfers                    Ambulation/Gait                 Stairs             Wheelchair Mobility    Modified Rankin (Stroke Patients Only)       Balance                                            Cognition Arousal/Alertness: Awake/alert Behavior During Therapy: Flat affect Overall Cognitive Status: Within Functional Limits for tasks assessed                                 General Comments: Very talkative and easily distracted - by self      Exercises Total Joint Exercises Ankle Circles/Pumps: AROM;Both;15 reps;Supine Quad Sets: AROM;Both;10 reps;Supine Heel Slides: AAROM;Left;Supine;20 reps Hip ABduction/ADduction: AAROM;Left;15 reps;Supine    General Comments        Pertinent Vitals/Pain Pain Assessment: Faces Faces Pain Scale: Hurts a little bit Pain Location: L hip with movement Pain  Descriptors / Indicators: Aching;Sore Pain Intervention(s): Limited activity within patient's tolerance;Monitored during session;Premedicated before session;Ice applied    Home Living                      Prior Function            PT Goals (current goals can now be found in the care plan section) Acute Rehab PT Goals Patient Stated Goal: Regain IND PT Goal Formulation: With patient Time For Goal Achievement: 03/23/20 Potential to Achieve Goals: Good Progress towards PT goals: Progressing toward goals    Frequency    Min 3X/week      PT Plan Current plan remains appropriate    Co-evaluation              AM-PAC PT "6 Clicks" Mobility   Outcome Measure  Help needed turning from your back to your side while in a flat bed without using bedrails?: A Lot Help needed moving from lying on your back to sitting on the side of a flat bed without using bedrails?: A Lot Help  needed moving to and from a bed to a chair (including a wheelchair)?: A Lot Help needed standing up from a chair using your arms (e.g., wheelchair or bedside chair)?: A Lot Help needed to walk in hospital room?: Total Help needed climbing 3-5 steps with a railing? : Total 6 Click Score: 10    End of Session Equipment Utilized During Treatment: Gait belt Activity Tolerance: Patient limited by fatigue;Other (comment) Patient left: in bed;with call bell/phone within reach;with family/visitor present;with nursing/sitter in room Nurse Communication: Mobility status PT Visit Diagnosis: Difficulty in walking, not elsewhere classified (R26.2);History of falling (Z91.81);Unsteadiness on feet (R26.81);Pain Pain - Right/Left: Left Pain - part of body: Hip     Time: 0160-1093 PT Time Calculation (min) (ACUTE ONLY): 28 min  Charges:  $Therapeutic Exercise: 8-22 mins                     Debe Coder PT Acute Rehabilitation Services Pager [phone] Office  [phone]    [name] 03/11/2020, 1:25 PM

## 2020-03-11 NOTE — Progress Notes (Addendum)
Inpatient Diabetes Program Recommendations  AACE/ADA: New Consensus Statement on Inpatient Glycemic Control (2015)  Target Ranges:  Prepandial:   less than 140 mg/dL      Peak postprandial:   less than 180 mg/dL (1-2 hours)      Critically ill patients:  140 - 180 mg/dL   Results for John Walton, John Walton (MRN [id number]) as of 03/11/2020 15:09  Ref. Range 03/10/2020 07:41 03/10/2020 11:56 03/10/2020 15:42 03/10/2020 21:24  Glucose-Capillary Latest Ref Range: 70 - 99 mg/dL 102 (H) 101 (H) 118 (H) 113 (H)   Results for John Walton, John Walton (MRN [id number]) as of 03/11/2020 15:09  Ref. Range 03/11/2020 07:35 03/11/2020 12:40  Glucose-Capillary Latest Ref Range: 70 - 99 mg/dL 90 132 (H)  2 units NOVOLOG   Results for John Walton, John Walton (MRN [id number]) as of 03/11/2020 15:09  Ref. Range 03/07/2020 17:00  Hemoglobin A1C Latest Ref Range: 4.8 - 5.6 % 6.7 (H)    Admit with: Left femoral neck intertroch fracture after fall  History: DM  Home DM Meds: None--Diet and exercise per MD notes  Current Orders: Novolog Moderate Correction Scale/ SSI (0-15 units) TID AC     Patient with well controlled glucose levels at home--Current A1c is 6.7%--This is within goals of care for ADA standards   Likely does not need any meds at home for his glucose control.  Given his age (and now that pt has had a fall with a fracture), I would not recommend lowering his A1c goal  Will attempt to see pt in person early AM 12/13 to see if he has any questions prior to d/c to SNF for Rehab    --Will follow patient during hospitalization--  Wyn Quaker RN, MSN, CDE Diabetes Coordinator Inpatient Glycemic Control Team Team Pager: [phone] (8a-5p)

## 2020-03-11 NOTE — Progress Notes (Signed)
Physical Therapy Treatment Patient Details Name: John Walton MRN: [id number] DOB: 11/03/1932 Today's Date: 03/11/2020    History of Present Illness [name]    PT Comments    Pt up in recliner and c/o increased pain.  Pt noted to be rotating L LE internally and corrected on same - pt with no recollection of THP despite review with every session.  Pt assisted from chair to stand/pvt and attempt stepping back to bedside.  Pt unable to complete task and bed brought behind pt and pt assisted to sit - BP in in sitting 77/48.  Pt assisted to supine max assist of 2.  RN present to assess pt.   Follow Up Recommendations  SNF     Equipment Recommendations  None recommended by PT    Recommendations for Other Services OT consult     Precautions / Restrictions Precautions Precautions: Fall;Posterior Hip Precaution Comments: Pt recalls no THP - all precautions reviewed with pt and spouse Required Braces or Orthoses: Knee Immobilizer - Left Knee Immobilizer - Left: On at all times Restrictions Weight Bearing Restrictions: No LLE Weight Bearing: Weight bearing as tolerated Other Position/Activity Restrictions: WBAT    Mobility  Bed Mobility Overal bed mobility: Needs Assistance Bed Mobility: Sit to Supine     Supine to sit: Mod assist;+2 for safety/equipment;+2 for physical assistance;HOB elevated Sit to supine: Max assist;+2 for physical assistance;+2 for safety/equipment   General bed mobility comments: cues for sequence and use of R LE to self assist.  Physical assist to manage LEs and to bring trunk to upright and complete rotation to EOB sitting with pad  Transfers Overall transfer level: Needs assistance Equipment used: Rolling walker (2 wheeled) Transfers: Sit to/from [facility] Sit to Stand: Mod assist;+2 physical assistance;+2 safety/equipment;From elevated surface Stand pivot transfers: Mod  assist;+2 physical assistance       General transfer comment: cues for LE management, adherence to THP, and use of UEs to self assist.   Physical assist required to bring wt up and fwd and to balance in standing  Ambulation/Gait Ambulation/Gait assistance: Mod assist;+2 physical assistance;+2 safety/equipment Gait Distance (Feet): 2 Feet Assistive device: Rolling walker (2 wheeled) Gait Pattern/deviations: Step-to pattern;Decreased step length - right;Decreased step length - left;Shuffle;Trunk flexed;Antalgic Gait velocity: decr   General Gait Details: cues for sequence, posture, increased UE WB and position from [employer]             Wheelchair Mobility    Modified Rankin (Stroke Patients Only)       Balance Overall balance assessment: Needs assistance Sitting-balance support: No upper extremity supported;Feet supported Sitting balance-Leahy Scale: Fair     Standing balance support: Bilateral upper extremity supported Standing balance-Leahy Scale: Poor                              Cognition Arousal/Alertness: Awake/alert Behavior During Therapy: Flat affect Overall Cognitive Status: Within Functional Limits for tasks assessed                                 General Comments: Very talkative and easily distracted - by self      Exercises      General Comments        Pertinent Vitals/Pain Pain Assessment: Faces Faces Pain Scale: Hurts even  more Pain Location: L hip with movement Pain Descriptors / Indicators: Aching;Sore Pain Intervention(s): Limited activity within patient's tolerance;Monitored during session;RN gave pain meds during session;Ice applied    Home Living                      Prior Function            PT Goals (current goals can now be found in the care plan section) Acute Rehab PT Goals Patient Stated Goal: Regain IND PT Goal Formulation: With patient Time For Goal Achievement:  03/23/20 Potential to Achieve Goals: Good Progress towards PT goals: Not progressing toward goals - comment (orthostatic)    Frequency    Min 3X/week      PT Plan Current plan remains appropriate    Co-evaluation              AM-PAC PT "6 Clicks" Mobility   Outcome Measure  Help needed turning from your back to your side while in a flat bed without using bedrails?: A Lot Help needed moving from lying on your back to sitting on the side of a flat bed without using bedrails?: A Lot Help needed moving to and from a bed to a chair (including a wheelchair)?: A Lot Help needed standing up from a chair using your arms (e.g., wheelchair or bedside chair)?: A Lot Help needed to walk in hospital room?: A Lot Help needed climbing 3-5 steps with a railing? : Total 6 Click Score: 11    End of Session Equipment Utilized During Treatment: Gait belt Activity Tolerance: Other (comment) (orthostatic) Patient left: in bed;with call bell/phone within reach;with bed alarm set;with family/visitor present Nurse Communication: Mobility status PT Visit Diagnosis: Difficulty in walking, not elsewhere classified (R26.2);History of falling (Z91.81);Unsteadiness on feet (R26.81);Pain Pain - Right/Left: Left Pain - part of body: Hip     Time: 1650-1711 PT Time Calculation (min) (ACUTE ONLY): 21 min  Charges:  $Gait Training: 8-22 mins $Therapeutic Activity: 8-22 mins                     Debe Coder PT Acute Rehabilitation Services Pager [phone] Office [phone]    [name] 03/11/2020, 5:24 PM

## 2020-03-11 NOTE — TOC Transition Note (Addendum)
Transition of Care [facility]) - CM/SW Progress Note   Patient Details  Name: John Walton MRN: [id number] Date of Birth: 23-Jul-1932  [facility]) CM/SW Contact:  Lia Hopping, Carl Phone Number: 03/11/2020, 5:43 PM   Clinical Narrative:    [facility] will [facility] the patient when medically stable.    Final next level of care: Skilled Nursing Facility Barriers to Discharge: Continued Medical Work up   Patient Goals and CMS Choice   CMS Medicare.gov Compare Post Acute Care list provided to:: Patient Choice offered to / list presented to : [facility]  Discharge Placement                       Discharge Plan and Services In-house Referral: Clinical Social Work Discharge Planning Services: CM Consult                                 Social Determinants of Health (SDOH) Interventions     Readmission Risk Interventions No flowsheet data found.

## 2020-03-12 LAB — COMPREHENSIVE METABOLIC PANEL
ALT: 12 U/L (ref 0–44)
AST: 24 U/L (ref 15–41)
Albumin: 2.8 g/dL — ABNORMAL LOW (ref 3.5–5.0)
Alkaline Phosphatase: 60 U/L (ref 38–126)
Anion gap: 11 (ref 5–15)
BUN: 72 mg/dL — ABNORMAL HIGH (ref 8–23)
CO2: 18 mmol/L — ABNORMAL LOW (ref 22–32)
Calcium: 7.5 mg/dL — ABNORMAL LOW (ref 8.9–10.3)
Chloride: 105 mmol/L (ref 98–111)
Creatinine, Ser: 2.19 mg/dL — ABNORMAL HIGH (ref 0.61–1.24)
GFR, Estimated: 29 mL/min — ABNORMAL LOW (ref >60)
Glucose, Bld: 122 mg/dL — ABNORMAL HIGH (ref 70–99)
Potassium: 4.2 mmol/L (ref 3.5–5.1)
Sodium: 134 mmol/L — ABNORMAL LOW (ref 135–145)
Total Bilirubin: 0.8 mg/dL (ref 0.3–1.2)
Total Protein: 6.1 g/dL — ABNORMAL LOW (ref 6.5–8.1)

## 2020-03-12 LAB — CBC WITH DIFFERENTIAL/PLATELET
Abs Immature Granulocytes: 0.09 10*3/uL — ABNORMAL HIGH (ref 0.00–0.07)
Basophils Absolute: 0 10*3/uL (ref 0.0–0.1)
Basophils Relative: 0 %
Eosinophils Absolute: 0.2 10*3/uL (ref 0.0–0.5)
Eosinophils Relative: 3 %
HCT: 24.1 % — ABNORMAL LOW (ref 39.0–52.0)
Hemoglobin: 7.9 g/dL — ABNORMAL LOW (ref 13.0–17.0)
Immature Granulocytes: 1 %
Lymphocytes Relative: 5 %
Lymphs Abs: 0.4 10*3/uL — ABNORMAL LOW (ref 0.7–4.0)
MCH: 29.2 pg (ref 26.0–34.0)
MCHC: 32.8 g/dL (ref 30.0–36.0)
MCV: 88.9 fL (ref 80.0–100.0)
Monocytes Absolute: 0.9 10*3/uL (ref 0.1–1.0)
Monocytes Relative: 12 %
Neutro Abs: 5.8 10*3/uL (ref 1.7–7.7)
Neutrophils Relative %: 79 %
Platelets: 217 10*3/uL (ref 150–400)
RBC: 2.71 MIL/uL — ABNORMAL LOW (ref 4.22–5.81)
RDW: 12.9 % (ref 11.5–15.5)
WBC: 7.3 10*3/uL (ref 4.0–10.5)
nRBC: 0 % (ref 0.0–0.2)

## 2020-03-12 LAB — GLUCOSE, CAPILLARY
Glucose-Capillary: 107 mg/dL — ABNORMAL HIGH (ref 70–99)
Glucose-Capillary: 113 mg/dL — ABNORMAL HIGH (ref 70–99)
Glucose-Capillary: 114 mg/dL — ABNORMAL HIGH (ref 70–99)
Glucose-Capillary: 116 mg/dL — ABNORMAL HIGH (ref 70–99)
Glucose-Capillary: 116 mg/dL — ABNORMAL HIGH (ref 70–99)
Glucose-Capillary: 119 mg/dL — ABNORMAL HIGH (ref 70–99)

## 2020-03-12 LAB — PHOSPHORUS: Phosphorus: 3.9 mg/dL (ref 2.5–4.6)

## 2020-03-12 LAB — MAGNESIUM: Magnesium: 2.3 mg/dL (ref 1.7–2.4)

## 2020-03-12 MED ORDER — GUAIFENESIN-DM 100-10 MG/5ML PO SYRP
5.0000 mL | ORAL_SOLUTION | ORAL | Status: DC | PRN
  Administered 2020-03-12 – 2020-03-13 (×2): 5 mL via ORAL
  Filled 2020-03-12 (×2): qty 10

## 2020-03-12 MED ORDER — ADULT MULTIVITAMIN W/MINERALS CH
1.0000 | ORAL_TABLET | Freq: Every day | ORAL | Status: DC
  Administered 2020-03-12: 1 via ORAL
  Filled 2020-03-12 (×2): qty 1

## 2020-03-12 MED ORDER — GLUCERNA SHAKE PO LIQD
237.0000 mL | Freq: Three times a day (TID) | ORAL | Status: DC
  Administered 2020-03-12 – 2020-03-13 (×4): 237 mL via ORAL
  Filled 2020-03-12 (×5): qty 237

## 2020-03-12 NOTE — Progress Notes (Signed)
Initial Nutrition Assessment  DOCUMENTATION CODES:   Not applicable  INTERVENTION:    Glucerna Shake po TID, each supplement provides 220 kcal and 10 grams of protein.  MVI with minerals daily.  Diet education handout provided (attached to D/C instructions).  NUTRITION DIAGNOSIS:   Increased nutrient needs related to wound healing as evidenced by estimated needs.  GOAL:   Patient will meet greater than or equal to 90% of their needs  MONITOR:   PO intake,Supplement acceptance,Labs  REASON FOR ASSESSMENT:   Consult Diet education  ASSESSMENT:   86 yo male admitted with L femoral neck fracture s/p fall at home. PMH includes HTN, HLD, glaucoma, RBBB, DM-2, CKD4, non compliance with meds.   Received consult for DM diet education. Patient is diet and exercise controlled at home per MD notes.  Unable to reach patient by phone.  "Heart Healthy, Consistent Carbohydrate Nutrition Therapy" handout from the Academy of [employer] attached to D/C instructions.  Plans for D/C to SNF when medically stable.  Currently on a CHO modified diet. Meal intakes documented at 0-75%. BMI=22.35 is on the low side for patient's age. He would benefit from a nutrition supplement to maximize oral intake to support healing.  Labs reviewed. Sodium 134, BUN 72, Creat 2.19 CBG: 114-107 this morning  Medications reviewed and include colace, novolog, flomax.   No recent weights available PTA for comparison.  Diet Order:   Diet Order            Diet Carb Modified Fluid consistency: Thin; Room service appropriate? Yes  Diet effective now                 EDUCATION NEEDS:   Education needs have been addressed  Skin:  Skin Assessment: Reviewed RN Assessment (L hip surgical incision)  Last BM:  12/8  Height:   Ht Readings from Last 1 Encounters:  03/08/20 5\' 6"  (1.676 m)    Weight:   Wt Readings from Last 1 Encounters:  03/08/20 62.8 kg    Ideal Body Weight:   64.5 kg  BMI:  Body mass index is 22.35 kg/m.  Estimated Nutritional Needs:   Kcal:  9604-5409  Protein:  85-95 gm  Fluid:  >/= 1.7 L    Lucas Mallow, RD, LDN, CNSC Please refer to Amion for contact information.

## 2020-03-12 NOTE — Progress Notes (Signed)
PROGRESS NOTE    John Walton  QHU:[id number] DOB: [birth year] DOA: 03/07/2020 PCP: [name], MD     Brief Narrative:  86 yo WM PMHx HTN, HLD, glaucoma, RBBB, DMII, CKD Stage 4, noncompliance to meds   Presented after a mechanical fall at home.  Patient is accompanied by his wife in the ER who helps provide further collateral information.  He was going up the stairs and states that he missed a step and fell onto the floor.  This happened around 10 PM on Monday evening.  It took several hours for his wife and another family member to get him in the chair and back to bed.  Due to his ongoing left hip pain he finally presented for further evaluation.  He follows outpatient with a PCP per his wife, [name].  He also follows with the [place], [name]. No records are available for review.  Only past records in epic are from 2016.  He had a right bundle branch block at that time and was evaluated by cardiology with recommendations for undergoing a stress test at that time, however he declined and has not presented back since.  He also endorses that he does not take any routine medications at home.  He has lived a natural lifestyle and only uses a herbal substance to help with the neuropathic pains he is describing in his feet. He states that he controlled his diabetes with diet and exercise.  We talked about his uncontrolled blood pressure in the ER; he does endorse some pain however his wife states that his blood pressure has been elevated at office visits as well.  He does not take any medications for his blood pressure. During exam he was noted to have a rather large and noticeable abdominal mass.  It was not pulsating but when asked what it was, the patient could not elaborate and it appears has not been evaluated or at least they cannot tell me so. We also discussed his renal function.  They are aware that it is decreased but also could not further elaborate.  He does endorse that  he has difficulty voiding oftentimes and can take up to 30 minutes to initiate voiding.  On work-up in the ER regarding his fall he underwent imaging.  His wife does note that he had a small cyst on his right forehead that they knew about but after his fall and hitting his head he did have an enlarged amount of swelling on his right forehead. CT head was negative for intracranial process but did show a right frontal scalp hematoma.  He has a remote right frontal infarct noted as well.  Mild cerebral atrophy.  Right shoulder x-ray negative for acute abnormality.  CXR unremarkable, some atelectasis. CT left hip shows acute nondisplaced fracture of left femoral neck and intertrochanteric region.  Severe osteoarthritis of left hip joint. Also showed enlarged prostate gland.  He was evaluated by orthopedic surgery with tentative plans for operative repair and is admitted for further work-up and treatment.   I have personally briefly reviewed patient's old medical records in [facility] and discussed patient with the ER provider when     Subjective: 12/12 afebrile overnight A/O x4.  Sitting in bed comfortably eating his lunch.   Assessment & Plan: Covid vaccination; vaccinated   Principal Problem:   Femoral neck fracture ([place]) Active Problems:   Essential hypertension   Hyperlipidemia   RBBB   CKD (chronic kidney disease), stage IV (  HCC)   Normocytic anemia   BPH (benign prostatic hyperplasia)   Abdominal mass   Scalp hematoma   Hypertensive urgency   Unilateral primary osteoarthritis, left hip   Bilateral hydronephrosis   LEFT femoral neck fracture  - s/p mechanical fall - CT hip shows acute nondisplaced fracture of left femoral neck and intertrochanteric region; severe OA left hip joint - seen by ortho; plans are for repair  12/8 - for now continue pain control  -12/8 patient is high risk surgically given his multiple medical problems.  In addition patient is  noncompliant with medication, and describes himself as a herbalist/naturalist.  I had a long talk with patient and wife prior to patient being taken down for repair of his hip and explained that he had multiple conditions which would need to be addressed, HTN, holosystolic murmur, DM type II, abdominal mass, CKD stage IV with bilateral hydronephrosis.  Unfortunately although patient at high risk surgically currently if LEFT femoral fracture not fixed patient will become bedbound which will lead to additional medical problems. -12/10 patient now understands that he must control his HTN and DM type II, and has been very compliant in taking medication during his hospitalization.  States he will continue to comply once discharged.  Essential HTN/HTN urgency -12/11 decrease Amlodipine 5 mg daily patient orthostatic hypotensive  -Hydralazine 25 mg PRN -Labetalol 10 mg PRN  New onset Holosystolic Murmur -Per patient and wife neither one knew of murmur -Has been evaluated by echocardiogram see results below  RBBB - saw Dr. Debara Pickett in 08/2014 and was offered a stress test due to new RBBB; patient declined and was to return in 3-4 months for follow up but did not do so - repeat EKG now: RBBB still present. No ischemia noted. No LVH - check TSH, A1c, Lipid -Echo; moderate LVH see results below   Abdominal mass - not pulsatile but u/k etiology at this time; patient nor wife are aware of significance or timeline of chronicity -abdominal ultrasound; see bilateral hydronephrosis  DM type II controlled with Hyperglycemia -12/7 Hemoglobin A1c= 6.7  -12/11 decrease sensitive SSI  -12/11Lantus was discontinued at some point.   -12/10 DM coordinator consult; patient with need for DM teaching -12/10 DM nutrition consultpatient with need for DM nutrition teaching  HLD -12/10 LDL= 95.  Goal is LDL<70 -12/10 Lipitor 40 mg  BPH (benign prostatic hyperplasia) - palpable bladder on exam, enlarged prostate on  CT, and patient reports difficulty with voiding at home - check bladder scan, if >350 cc , place indwelling foley -Flomax 0.4 mg daily  CKD stage IV (baseline Cr~2.06) -multifactorial uncontrolled HTN, DM type II -12/2014 Cr 2.28 Lab Results  Component Value Date   CREATININE 2.19 (H) 03/12/2020   CREATININE 2.22 (H) 03/11/2020   CREATININE 2.05 (H) 03/10/2020   CREATININE 1.96 (H) 03/09/2020   CREATININE 2.12 (H) 03/08/2020  - creat in 12/2014 was 2.28 with GFR 25; currently creat is 2.06. Likely has stage IV due to untreated and uncontrolled HTN and/or DM -Patient with bilateral hydronephrosis on renal ultrasound --Strict in and out -1.6 L -KVO LR  Bilateral Hydronephrosis -12/8 per urology recommendation;  Renal stone CT completed.;  Does not appear that there is pathology between his kidneys and bladder, which urology was concerned about however have sent secure chat requesting that they review CT to ensure I am correctly interpreting findings.  No mention of hydronephrosis, however until urology clears will continue with catheter placement., -Per Dr. Franchot Gallo urology;  needs to go w/ catheter and needs followup in office for TOV. If he can tolerate it I would recommend tamsulosin.   Scalp hematoma  - no intracranial process on CTH - no laceration or bleeding - continue supportive care  Anemia of chronic disease -12/9 Anemia panel most consistent with chronic disease -Fecal occult pending Lab Results  Component Value Date   HGB 7.9 (L) 03/12/2020   HGB 7.5 (L) 03/11/2020   HGB 8.4 (L) 03/10/2020   HGB 9.4 (L) 03/09/2020   HGB 9.5 (L) 03/08/2020     Goals of care  -PT recommends SNF   DVT prophylaxis: Held secondary to surgery Code Status: Full Family Communication: 12/12 wife at bedside for discussion of plan of care answered all questions Status is: Inpatient    Dispo: The patient is from: Home              Anticipated d/c is to: SNF?               Anticipated d/c date is: 12/13              Patient currently unstable      Consultants:  Orthopedic surgery Urology Dr. Franchot Gallo   Procedures/Significant Events:  12/7 US abdomen complete;Post cholecystectomy. -Medical renal disease changes of both kidneys with mild BILATERAL hydronephrosis of uncertain etiology. -Inadequate visualization of pancreas and aorta 12/9 Echocardiogram;LVEF =55 to 60%.  -moderate LVH Left ventricular diastolic parameters are indeterminate.  Aortic Valve: Mild to moderate aortic stenosis is present.   12/9 CT renal stone study; trace amount of gas in what appears to be a subtle cortical discontinuity along the anterior wall of the acetabulum which is not readily apparent on the hip CT from 03/07/2020. The possibility of a subtle anterior acetabular wall fracture in this vicinity is raised, although admittedly the finding is very localized/focal. -Marked prostatomegaly. 3. Other imaging findings of potential clinical significance: Low-density blood pool suggests anemia. 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 Severe osteoarthritis of the right hip. Expected postoperative findings in the tissue surrounding the left hip prosthesis. Multilevel lumbar spondylosis and degenerative disc disease causing mild degrees of foraminal impingement.    I have personally reviewed and interpreted all radiology studies and my findings are as above.  VENTILATOR SETTINGS:    Cultures   Antimicrobials: Anti-infectives (From admission, onward)   Start     Ordered Stop   03/08/20 0900  ceFAZolin (ANCEF) IVPB 2g/100 mL premix        03/08/20 0813 03/08/20 1505       Devices    LINES / TUBES:      Continuous Infusions: . lactated ringers Stopped (03/10/20 1825)     Objective: Vitals:   03/11/20 0504 03/11/20 1410 03/11/20 2109 03/12/20 0610  BP: (!) 111/51 (!) 126/59 134/62 (!) 124/51  Pulse: (!) 101  99 95 97  Resp: 16 19 20 16   Temp: 98.2 F (36.8 C) 98.1 F (36.7 C) 97.7 F (36.5 C) 98.6 F (37 C)  TempSrc: Oral Oral Oral Oral  SpO2: 93% 95% 100% 91%  Weight:      Height:        Intake/Output Summary (Last 24 hours) at 03/12/2020 1302 Last data filed at 03/12/2020 1155 Gross per 24 hour  Intake 800 ml  Output 1280 ml  Net -480 ml   Filed Weights   03/07/20 2123 03/08/20 1423  Weight: 62.8 kg 62.8 kg   Physical Exam:  General: A/O  x4 No acute respiratory distress Eyes: negative scleral hemorrhage, negative anisocoria, negative icterus ENT: Negative Runny nose, negative gingival bleeding, Neck:  Negative scars, masses, torticollis, lymphadenopathy, JVD Lungs: Clear to auscultation bilaterally without wheezes or crackles Cardiovascular: Regular rate and rhythm, positive holosystolic murmur, negative gallop or rub normal S1 and S2 Abdomen: negative abdominal pain, nondistended, positive soft, bowel sounds, no rebound, no ascites, no appreciable mass Extremities: LEFT leg surgical sites clean and covered no sign of bleeding or infection Skin: Negative rashes, lesions, ulcers Psychiatric:  Negative depression, negative anxiety, negative fatigue, negative mania  Central nervous system:  Cranial nerves II through XII intact, tongue/uvula midline, all extremities muscle strength 5/5, sensation intact throughout,  negative dysarthria, negative expressive aphasia, negative receptive aphasia. .     Data Reviewed: Care during the described time interval was provided by me .  I have reviewed this patient's available data, including medical history, events of note, physical examination, and all test results as part of my evaluation.  CBC: Recent Labs  Lab 03/08/20 0633 03/09/20 0338 03/10/20 0356 03/11/20 0332 03/12/20 0326  WBC 7.2 9.1 11.7* 7.6 7.3  NEUTROABS 5.7 8.4* 9.6* 5.9 5.8  HGB 9.5* 9.4* 8.4* 7.5* 7.9*  HCT 29.1* 29.2* 26.0* 23.3* 24.1*  MCV 88.4 89.0 88.7 88.9  88.9  PLT 177 164 183 179 301   Basic Metabolic Panel: Recent Labs  Lab 03/08/20 0633 03/09/20 0338 03/10/20 0356 03/11/20 0332 03/12/20 0326  NA 139 141 138 137 134*  K 4.1 4.7 4.2 3.9 4.2  CL 110 110 108 108 105  CO2 18* 19* 18* 18* 18*  GLUCOSE 151* 210* 120* 103* 122*  BUN 43* 47* 58* 71* 72*  CREATININE 2.12* 1.96* 2.05* 2.22* 2.19*  CALCIUM 7.8* 8.0* 7.6* 7.4* 7.5*  MG 2.1 2.1 2.1 2.2 2.3  PHOS 4.4  --   --   --  3.9   GFR: Estimated Creatinine Clearance: 21.9 mL/min (A) (by C-G formula based on SCr of 2.19 mg/dL (H)). Liver Function Tests: Recent Labs  Lab 03/07/20 1156 03/09/20 0338 03/10/20 0356 03/11/20 0332 03/12/20 0326  AST 23 27 25 24 24   ALT 15 14 13 12 12   ALKPHOS 92 64 60 57 60  BILITOT 0.6 0.7 0.6 0.9 0.8  PROT 7.8 6.8 6.3* 5.8* 6.1*  ALBUMIN 4.2 3.6 3.2* 2.8* 2.8*   No results for input(s): LIPASE, AMYLASE in the last 168 hours. No results for input(s): AMMONIA in the last 168 hours. Coagulation Profile: Recent Labs  Lab 03/07/20 1156  INR 1.1   Cardiac Enzymes: No results for input(s): CKTOTAL, CKMB, CKMBINDEX, TROPONINI in the last 168 hours. BNP (last 3 results) No results for input(s): PROBNP in the last 8760 hours. HbA1C: No results for input(s): HGBA1C in the last 72 hours. CBG: Recent Labs  Lab 03/11/20 2110 03/11/20 2349 03/12/20 0403 03/12/20 0723 03/12/20 1142  GLUCAP 123* 118* 114* 107* 119*   Lipid Profile: Recent Labs    03/10/20 0356  CHOL 160  HDL 37*  LDLCALC 95  TRIG 141  CHOLHDL 4.3   Thyroid Function Tests: No results for input(s): TSH, T4TOTAL, FREET4, T3FREE, THYROIDAB in the last 72 hours. Anemia Panel: No results for input(s): VITAMINB12, FOLATE, FERRITIN, TIBC, IRON, RETICCTPCT in the last 72 hours. Sepsis Labs: No results for input(s): PROCALCITON, LATICACIDVEN in the last 168 hours.  Recent Results (from the past 240 hour(s))  Resp Panel by RT-PCR (Flu A&B, Covid) Nasopharyngeal Swab      Status:  None   Collection Time: 03/07/20 11:57 AM   Specimen: Nasopharyngeal Swab; Nasopharyngeal(NP) swabs in vial transport medium  Result Value Ref Range Status   SARS Coronavirus 2 by RT PCR NEGATIVE NEGATIVE Final    Comment: (NOTE) SARS-CoV-2 target nucleic acids are NOT DETECTED.  The SARS-CoV-2 RNA is generally detectable in upper respiratory specimens during the acute phase of infection. The lowest concentration of SARS-CoV-2 viral copies this assay can detect is 138 copies/mL. A negative result does not preclude SARS-Cov-2 infection and should not be used as the sole basis for treatment or other patient management decisions. A negative result may occur with  improper specimen collection/handling, submission of specimen other than nasopharyngeal swab, presence of viral mutation(s) within the areas targeted by this assay, and inadequate number of viral copies(<138 copies/mL). A negative result must be combined with clinical observations, patient history, and epidemiological information. The expected result is Negative.  Fact Sheet for Patients:  EntrepreneurPulse.com.au  Fact Sheet for Healthcare Providers:  IncredibleEmployment.be  This test is no t yet approved or cleared by the [place] FDA and  has been authorized for detection and/or diagnosis of SARS-CoV-2 by FDA under an Emergency Use Authorization (EUA). This EUA will remain  in effect (meaning this test can be used) for the duration of the COVID-19 declaration under Section 564(b)(1) of the Act, 21 U.S.C.section 360bbb-3(b)(1), unless the authorization is terminated  or revoked sooner.       Influenza A by PCR NEGATIVE NEGATIVE Final   Influenza B by PCR NEGATIVE NEGATIVE Final    Comment: (NOTE) The Xpert Xpress SARS-CoV-2/FLU/RSV plus assay is intended as an aid in the diagnosis of influenza from Nasopharyngeal swab specimens and should not be used as a sole basis  for treatment. Nasal washings and aspirates are unacceptable for Xpert Xpress SARS-CoV-2/FLU/RSV testing.  Fact Sheet for Patients: EntrepreneurPulse.com.au  Fact Sheet for Healthcare Providers: IncredibleEmployment.be  This test is not yet approved or cleared by the [place] FDA and has been authorized for detection and/or diagnosis of SARS-CoV-2 by FDA under an Emergency Use Authorization (EUA). This EUA will remain in effect (meaning this test can be used) for the duration of the COVID-19 declaration under Section 564(b)(1) of the Act, 21 U.S.C. section 360bbb-3(b)(1), unless the authorization is terminated or revoked.  Performed at [facility], [place] [address], [place], [place] [address]   Surgical PCR screen     Status: None   Collection Time: 03/08/20  9:29 AM   Specimen: Nasal Mucosa; Nasal Swab  Result Value Ref Range Status   MRSA, PCR NEGATIVE NEGATIVE Final   Staphylococcus aureus NEGATIVE NEGATIVE Final    Comment: (NOTE) The Xpert SA Assay (FDA approved for NASAL specimens in patients 69 years of age and older), is one component of a comprehensive surveillance program. It is not intended to diagnose infection nor to guide or monitor treatment. Performed at [facility], [place] [address], [place], [place] [address]          Radiology Studies: No results found.      Scheduled Meds: . amLODipine  5 mg Oral Daily  . aspirin EC  325 mg Oral Q breakfast  . atorvastatin  40 mg Oral Daily  . Chlorhexidine Gluconate Cloth  6 each Topical Daily  . docusate sodium  100 mg Oral BID  . feeding supplement (GLUCERNA SHAKE)  237 mL Oral TID BM  . insulin aspart  0-9 Units Subcutaneous Q4H  . latanoprost  1 drop Both Eyes QHS  . multivitamin with minerals  1 tablet Oral Daily  . sodium chloride flush  3 mL Intravenous Q12H  . tamsulosin  0.4 mg Oral QPC supper   Continuous  Infusions: . lactated ringers Stopped (03/10/20 1825)     LOS: 5 days    Time spent:40 min    Ray Gervasi, Geraldo Docker, MD [facility] Pager [phone]  If 7PM-7AM, please contact night-coverage [email or web address] Password [email or web address] 03/12/2020, 1:02 PM

## 2020-03-13 DIAGNOSIS — N400 Enlarged prostate without lower urinary tract symptoms: Secondary | ICD-10-CM | POA: Diagnosis not present

## 2020-03-13 DIAGNOSIS — N1330 Unspecified hydronephrosis: Secondary | ICD-10-CM | POA: Diagnosis not present

## 2020-03-13 DIAGNOSIS — Z9181 History of falling: Secondary | ICD-10-CM | POA: Diagnosis not present

## 2020-03-13 DIAGNOSIS — W19XXXA Unspecified fall, initial encounter: Secondary | ICD-10-CM | POA: Diagnosis not present

## 2020-03-13 DIAGNOSIS — R0902 Hypoxemia: Secondary | ICD-10-CM | POA: Diagnosis not present

## 2020-03-13 DIAGNOSIS — Z466 Encounter for fitting and adjustment of urinary device: Secondary | ICD-10-CM | POA: Diagnosis not present

## 2020-03-13 DIAGNOSIS — H409 Unspecified glaucoma: Secondary | ICD-10-CM | POA: Diagnosis not present

## 2020-03-13 DIAGNOSIS — E785 Hyperlipidemia, unspecified: Secondary | ICD-10-CM | POA: Diagnosis not present

## 2020-03-13 DIAGNOSIS — N184 Chronic kidney disease, stage 4 (severe): Secondary | ICD-10-CM | POA: Diagnosis not present

## 2020-03-13 DIAGNOSIS — R112 Nausea with vomiting, unspecified: Secondary | ICD-10-CM | POA: Diagnosis not present

## 2020-03-13 DIAGNOSIS — I160 Hypertensive urgency: Secondary | ICD-10-CM | POA: Diagnosis not present

## 2020-03-13 DIAGNOSIS — N401 Enlarged prostate with lower urinary tract symptoms: Secondary | ICD-10-CM | POA: Diagnosis not present

## 2020-03-13 DIAGNOSIS — I959 Hypotension, unspecified: Secondary | ICD-10-CM | POA: Diagnosis not present

## 2020-03-13 DIAGNOSIS — R079 Chest pain, unspecified: Secondary | ICD-10-CM | POA: Diagnosis not present

## 2020-03-13 DIAGNOSIS — S72002D Fracture of unspecified part of neck of left femur, subsequent encounter for closed fracture with routine healing: Secondary | ICD-10-CM | POA: Diagnosis not present

## 2020-03-13 DIAGNOSIS — Z794 Long term (current) use of insulin: Secondary | ICD-10-CM | POA: Diagnosis not present

## 2020-03-13 DIAGNOSIS — R1900 Intra-abdominal and pelvic swelling, mass and lump, unspecified site: Secondary | ICD-10-CM | POA: Diagnosis not present

## 2020-03-13 DIAGNOSIS — R0789 Other chest pain: Secondary | ICD-10-CM | POA: Diagnosis not present

## 2020-03-13 DIAGNOSIS — S72002A Fracture of unspecified part of neck of left femur, initial encounter for closed fracture: Secondary | ICD-10-CM | POA: Diagnosis not present

## 2020-03-13 DIAGNOSIS — E1122 Type 2 diabetes mellitus with diabetic chronic kidney disease: Secondary | ICD-10-CM | POA: Diagnosis not present

## 2020-03-13 DIAGNOSIS — D649 Anemia, unspecified: Secondary | ICD-10-CM | POA: Diagnosis not present

## 2020-03-13 DIAGNOSIS — S0003XA Contusion of scalp, initial encounter: Secondary | ICD-10-CM | POA: Diagnosis not present

## 2020-03-13 DIAGNOSIS — E7801 Familial hypercholesterolemia: Secondary | ICD-10-CM | POA: Diagnosis not present

## 2020-03-13 DIAGNOSIS — I4510 Unspecified right bundle-branch block: Secondary | ICD-10-CM | POA: Diagnosis not present

## 2020-03-13 DIAGNOSIS — E1165 Type 2 diabetes mellitus with hyperglycemia: Secondary | ICD-10-CM | POA: Diagnosis not present

## 2020-03-13 DIAGNOSIS — N179 Acute kidney failure, unspecified: Secondary | ICD-10-CM | POA: Diagnosis not present

## 2020-03-13 DIAGNOSIS — Z7401 Bed confinement status: Secondary | ICD-10-CM | POA: Diagnosis not present

## 2020-03-13 DIAGNOSIS — K5900 Constipation, unspecified: Secondary | ICD-10-CM | POA: Diagnosis not present

## 2020-03-13 DIAGNOSIS — Z09 Encounter for follow-up examination after completed treatment for conditions other than malignant neoplasm: Secondary | ICD-10-CM | POA: Diagnosis not present

## 2020-03-13 DIAGNOSIS — M2550 Pain in unspecified joint: Secondary | ICD-10-CM | POA: Diagnosis not present

## 2020-03-13 DIAGNOSIS — I129 Hypertensive chronic kidney disease with stage 1 through stage 4 chronic kidney disease, or unspecified chronic kidney disease: Secondary | ICD-10-CM | POA: Diagnosis not present

## 2020-03-13 LAB — COMPREHENSIVE METABOLIC PANEL
ALT: 14 U/L (ref 0–44)
AST: 26 U/L (ref 15–41)
Albumin: 3 g/dL — ABNORMAL LOW (ref 3.5–5.0)
Alkaline Phosphatase: 67 U/L (ref 38–126)
Anion gap: 13 (ref 5–15)
BUN: 69 mg/dL — ABNORMAL HIGH (ref 8–23)
CO2: 17 mmol/L — ABNORMAL LOW (ref 22–32)
Calcium: 7.9 mg/dL — ABNORMAL LOW (ref 8.9–10.3)
Chloride: 106 mmol/L (ref 98–111)
Creatinine, Ser: 2.19 mg/dL — ABNORMAL HIGH (ref 0.61–1.24)
GFR, Estimated: 29 mL/min — ABNORMAL LOW (ref >60)
Glucose, Bld: 132 mg/dL — ABNORMAL HIGH (ref 70–99)
Potassium: 4.2 mmol/L (ref 3.5–5.1)
Sodium: 136 mmol/L (ref 135–145)
Total Bilirubin: 0.8 mg/dL (ref 0.3–1.2)
Total Protein: 6.5 g/dL (ref 6.5–8.1)

## 2020-03-13 LAB — CBC WITH DIFFERENTIAL/PLATELET
Abs Immature Granulocytes: 0.14 10*3/uL — ABNORMAL HIGH (ref 0.00–0.07)
Basophils Absolute: 0 10*3/uL (ref 0.0–0.1)
Basophils Relative: 0 %
Eosinophils Absolute: 0.2 10*3/uL (ref 0.0–0.5)
Eosinophils Relative: 2 %
HCT: 26.3 % — ABNORMAL LOW (ref 39.0–52.0)
Hemoglobin: 8.4 g/dL — ABNORMAL LOW (ref 13.0–17.0)
Immature Granulocytes: 2 %
Lymphocytes Relative: 6 %
Lymphs Abs: 0.6 10*3/uL — ABNORMAL LOW (ref 0.7–4.0)
MCH: 28.3 pg (ref 26.0–34.0)
MCHC: 31.9 g/dL (ref 30.0–36.0)
MCV: 88.6 fL (ref 80.0–100.0)
Monocytes Absolute: 1 10*3/uL (ref 0.1–1.0)
Monocytes Relative: 12 %
Neutro Abs: 6.6 10*3/uL (ref 1.7–7.7)
Neutrophils Relative %: 78 %
Platelets: 278 10*3/uL (ref 150–400)
RBC: 2.97 MIL/uL — ABNORMAL LOW (ref 4.22–5.81)
RDW: 12.7 % (ref 11.5–15.5)
WBC: 8.5 10*3/uL (ref 4.0–10.5)
nRBC: 0 % (ref 0.0–0.2)

## 2020-03-13 LAB — GLUCOSE, CAPILLARY
Glucose-Capillary: 123 mg/dL — ABNORMAL HIGH (ref 70–99)
Glucose-Capillary: 109 mg/dL — ABNORMAL HIGH (ref 70–99)
Glucose-Capillary: 124 mg/dL — ABNORMAL HIGH (ref 70–99)
Glucose-Capillary: 111 mg/dL — ABNORMAL HIGH (ref 70–99)
Glucose-Capillary: 142 mg/dL — ABNORMAL HIGH (ref 70–99)

## 2020-03-13 LAB — PHOSPHORUS: Phosphorus: 4.2 mg/dL (ref 2.5–4.6)

## 2020-03-13 LAB — MAGNESIUM: Magnesium: 2.4 mg/dL (ref 1.7–2.4)

## 2020-03-13 LAB — SARS CORONAVIRUS 2 BY RT PCR (HOSPITAL ORDER, PERFORMED IN ~~LOC~~ HOSPITAL LAB): SARS Coronavirus 2: NEGATIVE

## 2020-03-13 MED ORDER — METOCLOPRAMIDE HCL 5 MG PO TABS: 5.0000 mg | ORAL_TABLET | Freq: Three times a day (TID) | ORAL | 0 refills | Status: DC | PRN

## 2020-03-13 MED ORDER — SENNOSIDES-DOCUSATE SODIUM 8.6-50 MG PO TABS: 1.0000 | ORAL_TABLET | Freq: Two times a day (BID) | ORAL | 0 refills | Status: DC | PRN

## 2020-03-13 MED ORDER — TAMSULOSIN HCL 0.4 MG PO CAPS: 0.4000 mg | ORAL_CAPSULE | Freq: Every day | ORAL | 0 refills | Status: DC

## 2020-03-13 MED ORDER — INSULIN GLARGINE 100 UNIT/ML SOLOSTAR PEN
5.0000 [IU] | PEN_INJECTOR | Freq: Every day | SUBCUTANEOUS | 0 refills | Status: DC
Start: 2020-03-13 — End: 2020-04-03

## 2020-03-13 MED ORDER — ALUM & MAG HYDROXIDE-SIMETH 200-200-20 MG/5ML PO SUSP
15.0000 mL | ORAL | Status: DC | PRN
  Filled 2020-03-13: qty 30

## 2020-03-13 MED ORDER — INSULIN GLARGINE 100 UNIT/ML ~~LOC~~ SOLN
5.0000 [IU] | Freq: Every day | SUBCUTANEOUS | Status: DC
  Administered 2020-03-13: 5 [IU] via SUBCUTANEOUS
  Filled 2020-03-13: qty 0.05

## 2020-03-13 MED ORDER — GUAIFENESIN-DM 100-10 MG/5ML PO SYRP: 5.0000 mL | ORAL_SOLUTION | ORAL | 0 refills | Status: DC | PRN

## 2020-03-13 MED ORDER — INSULIN PEN NEEDLE 29G X 12.7MM MISC: 0 refills | Status: DC

## 2020-03-13 MED ORDER — DOCUSATE SODIUM 100 MG PO CAPS: 100.0000 mg | ORAL_CAPSULE | Freq: Two times a day (BID) | ORAL | 0 refills | Status: DC

## 2020-03-13 MED ORDER — ATORVASTATIN CALCIUM 40 MG PO TABS: 40.0000 mg | ORAL_TABLET | Freq: Every day | ORAL | 0 refills | Status: DC

## 2020-03-13 MED ORDER — ALUM & MAG HYDROXIDE-SIMETH 200-200-20 MG/5ML PO SUSP: 15.0000 mL | ORAL | 0 refills | Status: DC | PRN

## 2020-03-13 MED ORDER — ACETAMINOPHEN 325 MG PO TABS: 650.0000 mg | ORAL_TABLET | Freq: Four times a day (QID) | ORAL | 0 refills | Status: DC | PRN

## 2020-03-13 MED ORDER — AMLODIPINE BESYLATE 5 MG PO TABS: 5.0000 mg | ORAL_TABLET | Freq: Every day | ORAL | 0 refills | Status: DC

## 2020-03-13 NOTE — Progress Notes (Signed)
   Subjective: 5 Days Post-Op Procedure(s) (LRB): TOTAL HIP ARTHROPLASTY (Left) Patient reports pain as mild and moderate.    Objective: Vital signs in last 24 hours: Temp:  [98.2 F (36.8 C)-98.8 F (37.1 C)] 98.5 F (36.9 C) (12/13 0358) Pulse Rate:  [90-100] 100 (12/13 0358) Resp:  [16-17] 16 (12/13 0358) BP: (120-133)/(47-61) 128/61 (12/13 0358) SpO2:  [88 %-91 %] 91 % (12/13 0358)  Intake/Output from previous day: 12/12 0701 - 12/13 0700 In: 840 [P.O.:840] Out: 1660 [Urine:1660] Intake/Output this shift: Total I/O In: 25 [P.O.:25] Out: 200 [Urine:200]  Recent Labs    03/11/20 0332 03/12/20 0326 03/13/20 0255  HGB 7.5* 7.9* 8.4*   Recent Labs    03/12/20 0326 03/13/20 0255  WBC 7.3 8.5  RBC 2.71* 2.97*  HCT 24.1* 26.3*  PLT 217 278   Recent Labs    03/12/20 0326 03/13/20 0255  NA 134* 136  K 4.2 4.2  CL 105 106  CO2 18* 17*  BUN 72* 69*  CREATININE 2.19* 2.19*  GLUCOSE 122* 132*  CALCIUM 7.5* 7.9*   No results for input(s): LABPT, INR in the last 72 hours.  Neurologically intact No results found.  Assessment/Plan: 5 Days Post-Op Procedure(s) (LRB): TOTAL HIP ARTHROPLASTY (Left) Up with therapy  Marybelle Killings 03/13/2020, 10:45 AM

## 2020-03-13 NOTE — Care Management Important Message (Signed)
Important Message  Patient Details IM Letter given to the Patient. Name: John Walton MRN: [id number] Date of Birth: June 22, 1932   Medicare Important Message Given:  Yes     [name] 03/13/2020, 1:41 PM

## 2020-03-13 NOTE — Discharge Summary (Signed)
Physician Discharge Summary  John Walton ZOX:[id number] DOB: 10-10-1932 DOA: 03/07/2020  PCP: [name], MD  Admit date: 03/07/2020 Discharge date: 03/13/2020  Time spent: 35 minutes  Recommendations for Outpatient Follow-up:  Covid vaccination; vaccinated   LEFT femoral neck fracture  - s/p mechanical fall - CT hip shows acute nondisplaced fracture of left femoral neck and intertrochanteric region; severe OA left hip joint - seen by ortho; plans are for repair 12/8 - for now continue pain control  -12/8 patient is high risk surgically given his multiple medical problems.  In addition patient is noncompliant with medication, and describes himself as a herbalist/naturalist.  I had a long talk with patient and wife prior to patient being taken down for repair of his hip and explained that he had multiple conditions which would need to be addressed, HTN, holosystolic murmur, DM type II, abdominal mass, CKD stage IV with bilateral hydronephrosis.  Unfortunately although patient at high risk surgically currently if LEFT femoral fracture not fixed patient will become bedbound which will lead to additional medical problems. -12/10 patient now understands that he must control his HTN and DM type II, and has been very compliant in taking medication during his hospitalization.  States he will continue to comply once discharged. -Schedule appointment with Dr. Rodell Perna orthopedic surgery in 2 weeks follow-up LEFT femoral neck fracture  Essential HTN/HTN urgency -12/11 decrease Amlodipine 5 mg daily patient orthostatic hypotensive  -Hydralazine 25 mg PRN -Labetalol 10 mg PRN  New onset Holosystolic Murmur -Per patient and wife neither one knew of murmur -Has been evaluated by echocardiogram see results below  RBBB - saw Dr. Debara Pickett in 08/2014 and was offered a stress test due to new RBBB; patient declined and was to return in 3-4 months for follow up but did not do so - repeat EKG  now: RBBB still present. No ischemia noted. No LVH -Echo; moderate LVH see results below   Abdominal mass - not pulsatile but u/k etiology at this time; patient nor wife are aware of significance or timeline of chronicity -abdominal ultrasound; see bilateral hydronephrosis  DM type II controlled with Hyperglycemia -12/7 Hemoglobin A1c= 6.7  -Lantus 5 units daily.   -12/10 DM coordinator consult; patient with need for DM teaching -12/10 DM nutrition consultpatient with need for DM nutrition teaching -Schedule follow-up with Dr. Domenick Gong for 1 to 2 weeks DM type II controlled with hyperglycemia, essential HTN  HLD -12/10 LDL= 95.  Goal is LDL<70 -12/10 Lipitor 40 mg  BPH (benign prostatic hyperplasia) - palpable bladder on exam, enlarged prostate on CT, and patient reports difficulty with voiding at home - check bladder scan, if >350 cc , place indwelling foley.  Foley to remain in place until patient follows up with urologist -Flomax 0.4 mg daily  CKD stage IV (baseline Cr~2.06) -multifactorial uncontrolled HTN, DM type II Lab Results  Component Value Date   CREATININE 2.19 (H) 03/13/2020   CREATININE 2.19 (H) 03/12/2020   CREATININE 2.22 (H) 03/11/2020   CREATININE 2.05 (H) 03/10/2020   CREATININE 1.96 (H) 03/09/2020  - creat in 12/2014 was 2.28 with GFR 25; currently creat is 2.06. Likely has stage IV due to untreated and uncontrolled HTN and/or DM -Patient with bilateral hydronephrosis on renal ultrasound --Strict in and out -1.6 L  Bilateral Hydronephrosis -12/8 per urology recommendation;  Renal stone CT completed.;  Does not appear that there is pathology between his kidneys and bladder, which urology was concerned about however have sent  secure chat requesting that they review CT to ensure I am correctly interpreting findings.  No mention of hydronephrosis, however until urology clears will continue with catheter placement., -Per Dr. Franchot Gallo  urology; needs to go w/ catheter and needs followup in office for TOV. If he can tolerate it. . -Schedule follow-up appointment at [facility] with Dr. Franchot Gallo in 1 week, bilateral hydronephrosis, CKD stage IV   Scalp hematoma - no intracranial process on CTH - no laceration or bleeding - continue supportive care  Anemia of chronic disease -12/9 Anemia panel most consistent with chronic disease Lab Results  Component Value Date   HGB 8.4 (L) 03/13/2020   HGB 7.9 (L) 03/12/2020   HGB 7.5 (L) 03/11/2020   HGB 8.4 (L) 03/10/2020   HGB 9.4 (L) 03/09/2020  -Stable     Discharge Diagnoses:  Principal Problem:   Femoral neck fracture ([place]) Active Problems:   Essential hypertension   Hyperlipidemia   RBBB   CKD (chronic kidney disease), stage IV (HCC)   Normocytic anemia   BPH (benign prostatic hyperplasia)   Abdominal mass   Scalp hematoma   Hypertensive urgency   Unilateral primary osteoarthritis, left hip   Bilateral hydronephrosis   Discharge Condition: Stable Diet recommendation: Carb modified heart healthy  Filed Weights   03/07/20 2123 03/08/20 1423  Weight: 62.8 kg 62.8 kg    History of present illness:  86 yo WM PMHx HTN, HLD, glaucoma, RBBB, DMII, CKD Stage 4, noncompliance to meds   Presented after a mechanical fall at home. Patient is accompanied by his wife in the ER who helps provide further collateral information. He was going up the stairs and states that he missed a step and fell onto the floor. This happened around 10 PM on Monday evening. It took several hours for his wife and another family member to get him in the chair and back to bed. Due to his ongoing left hip pain he finally presented for further evaluation.  He follows outpatient with a PCP per his wife, [name]. He also follows with the [facility], [name]. No records are available for review.  Only past records in epic are from 2016. He had a right bundle branch  block at that time and was evaluated by cardiology with recommendations for undergoing a stress test at that time, however he declined and has not presented back since.  He also endorses that he does not take any routine medications at home. He has lived a natural lifestyle and only uses a herbal substance to help with the neuropathic pains he is describing in his feet. He states that he controlled his diabetes with diet and exercise. We talked about his uncontrolled blood pressure in the ER; he does endorse some pain however his wife states that his blood pressure has been elevated at office visits as well. He does not take any medications for his blood pressure. During exam he was noted to have a rather large and noticeable abdominal mass. It was not pulsating but when asked what it was, the patient could not elaborate and it appears has not been evaluated or at least they cannot tell me so. We also discussed his renal function. They are aware that it is decreased but also could not further elaborate. He does endorse that he has difficulty voiding oftentimes and can take up to 30 minutes to initiate voiding.  On work-up in the ER regarding his fall he underwent imaging. His wife does note  that he had a small cyst on his right forehead that they knew about but after his fall and hitting his head he did have an enlarged amount of swelling on his right forehead. CT head was negative for intracranial process but did show a right frontal scalp hematoma. He has a remote right frontal infarct noted as well. Mild cerebral atrophy.  Right shoulder x-ray negative for acute abnormality. CXR unremarkable, some atelectasis. CT left hip shows acute nondisplaced fracture of left femoral neck and intertrochanteric region. Severe osteoarthritis of left hip joint. Also showed enlarged prostate gland.  He was evaluated by orthopedic surgery with tentative plans for operative repair and is admitted for  further work-up and treatment.   I have personally briefly reviewed patient's old medical records in [facility] and discussed patient with the ER provider when    Hospital Course:  See above  Procedures: 12/7 US abdomen complete;Post cholecystectomy. -Medical renal disease changes of both kidneys with mild BILATERAL hydronephrosis of uncertain etiology. -Inadequate visualization of pancreas and aorta 12/9 Echocardiogram;LVEF =55 to 60%.  -moderate LVH Left ventricular diastolic parameters are indeterminate.  Aortic Valve: Mild to moderate aortic stenosis is present.   12/9 CT renal stone study; trace amount of gas in what appears to be a subtle cortical discontinuity along the anterior wall of the acetabulum which is not readily apparent on the hip CT from 03/07/2020. The possibility of a subtle anterior acetabular wall fracture in this vicinity is raised, although admittedly the finding is very localized/focal. -Marked prostatomegaly. 3. Other imaging findings of potential clinical significance: Low-density blood pool suggests anemia. 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 Severe osteoarthritis of the right hip. Expected postoperative findings in the tissue surrounding the left hip prosthesis. Multilevel lumbar spondylosis and degenerative disc disease causing mild degrees of foraminal impingement.  Consultations: Orthopedic surgery Urology Dr. Franchot Gallo   Cultures   12/7 SARS coronavirus negative 12/7 influenza A/B negative 12/13 SARS coronavirus negative   Antibiotics Anti-infectives (From admission, onward)   Start     Ordered Stop   03/08/20 0900  ceFAZolin (ANCEF) IVPB 2g/100 mL premix        03/08/20 0813 03/08/20 1505       Discharge Exam: Vitals:   03/12/20 0610 03/12/20 1353 03/12/20 2151 03/13/20 0358  BP: (!) 124/51 (!) 120/47 133/61 128/61  Pulse: 97 97 90 100  Resp: 16 17 16 16   Temp: 98.6 F (37  C) 98.2 F (36.8 C) 98.8 F (37.1 C) 98.5 F (36.9 C)  TempSrc: Oral Oral    SpO2: 91% 91% (!) 88% 91%  Weight:      Height:        General: A/O x4 No acute respiratory distress Eyes: negative scleral hemorrhage, negative anisocoria, negative icterus ENT: Negative Runny nose, negative gingival bleeding, Neck:  Negative scars, masses, torticollis, lymphadenopathy, JVD Lungs: Clear to auscultation bilaterally without wheezes or crackles Cardiovascular: Regular rate and rhythm, positive holosystolic murmur, negative gallop or rub normal S1 and S2   Discharge Instructions   Allergies as of 03/13/2020   No Known Allergies     Medication List    TAKE these medications   acetaminophen 325 MG tablet Commonly known as: TYLENOL Take 2 tablets (650 mg total) by mouth every 6 (six) hours as needed for mild pain (or Fever >/= 101).   alum & mag hydroxide-simeth 200-200-20 MG/5ML suspension Commonly known as: MAALOX/MYLANTA Take 15 mLs by mouth every 4 (four) hours as  needed for indigestion or heartburn.   amLODipine 5 MG tablet Commonly known as: NORVASC Take 1 tablet (5 mg total) by mouth daily. Start taking on: March 14, 2020   aspirin 325 MG EC tablet Take 1 tablet (325 mg total) by mouth daily with breakfast. MUST TAKE AT LEAST 4 WEEKS POSTOP FOR DVT PROPHYLAXIS   atorvastatin 40 MG tablet Commonly known as: LIPITOR Take 1 tablet (40 mg total) by mouth daily. Start taking on: March 14, 2020   carboxymethylcellulose 0.5 % Soln Commonly known as: REFRESH PLUS Place 1 drop into both eyes 3 (three) times daily as needed (dry eyes).   docusate sodium 100 MG capsule Commonly known as: COLACE Take 1 capsule (100 mg total) by mouth 2 (two) times daily.   guaiFENesin-dextromethorphan 100-10 MG/5ML syrup Commonly known as: ROBITUSSIN DM Take 5 mLs by mouth every 4 (four) hours as needed for cough.   HYDROcodone-acetaminophen 10-325 MG tablet Commonly known as:  Norco Take 1 tablet by mouth every 6 (six) hours as needed.   insulin glargine 100 UNIT/ML Solostar Pen Commonly known as: LANTUS Inject 5 Units into the skin daily.   Insulin Pen Needle 29G X 12.7MM Misc Use as directed   latanoprost 0.005 % ophthalmic solution Commonly known as: XALATAN Place 1 drop into both eyes at bedtime.   metoCLOPramide 5 MG tablet Commonly known as: REGLAN Take 1-2 tablets (5-10 mg total) by mouth every 8 (eight) hours as needed for nausea (if ondansetron (ZOFRAN) ineffective.).   senna-docusate 8.6-50 MG tablet Commonly known as: Senokot-S Take 1 tablet by mouth 2 (two) times daily as needed for mild constipation.   Simbrinza 1-0.2 % Susp Generic drug: Brinzolamide-Brimonidine Place 1 drop into both eyes in the morning and at bedtime.   Systane 0.4-0.3 % Gel ophthalmic gel Generic drug: Polyethyl Glycol-Propyl Glycol Place 1 application into both eyes in the morning and at bedtime.   tamsulosin 0.4 MG Caps capsule Commonly known as: FLOMAX Take 1 capsule (0.4 mg total) by mouth daily after supper.   Vicks BabyRub Oint Apply 1 application topically as needed (skin irritations). Adult strength      No Known Allergies  Contact information for follow-up providers    Marybelle Killings, MD. Schedule an appointment as soon as possible for a visit today.   Specialty: Orthopedic Surgery Why: NEED RETURN OFFICE VISIT WITH DR Lorin Mercy.  CALL TO SCHEDULE APPOINTMENT.  Contact information: [place] [address] [place] Follow up.   Why: call for followup for possible catheter removal Contact information: [place]       Tisovec, Fransico Him, MD. Schedule an appointment as soon as possible for a visit in 2 week(s).   Specialty: Internal Medicine Why: Schedule follow-up with Dr. Domenick Gong for 1 to 2 weeks DM type II controlled with  hyperglycemia, essential HTN Contact information: [address] [place] [address] [phone]            Contact information for after-discharge care    Destination    [facility] .   Service: Skilled Nursing Contact information: [address] [place] [phone]                   The results of significant diagnostics from this hospitalization (including imaging, microbiology, ancillary and laboratory) are listed below for reference.  Significant Diagnostic Studies: DG Chest 1 View  Result Date: 03/07/2020 CLINICAL DATA:  Preop exam. EXAM: CHEST  1 VIEW COMPARISON:  Chest x-ray 4 06/20/1998. FINDINGS: Mediastinum and hilar structures normal. Heart size normal. Low lung volumes with mild left base subsegmental axis. No pleural effusion or pneumothorax. Degenerative change thoracic spine and both shoulders. IMPRESSION: Low lung volumes with mild left base subsegmental atelectasis. Electronically Signed   By: Marcello Moores  Register   On: 03/07/2020 13:07   DG Shoulder Right  Result Date: 03/07/2020 CLINICAL DATA:  Right shoulder pain after a fall last night. EXAM: RIGHT SHOULDER - 2+ VIEW COMPARISON:  None. FINDINGS: No acute fracture or dislocation is identified. Mild-to-moderate joint space narrowing and marginal spurring are noted at the acromioclavicular joint. Narrowing of the acromial humeral interval could indicate a rotator cuff tear. IMPRESSION: No acute osseous abnormality identified. Electronically Signed   By: Logan Bores M.D.   On: 03/07/2020 13:05   CT HEAD WO CONTRAST  Result Date: 03/07/2020 CLINICAL DATA:  Head trauma, minor (Age >= 65y) EXAM: CT HEAD WITHOUT CONTRAST TECHNIQUE: Contiguous axial images were obtained from the base of the skull through the vertex without intravenous contrast. COMPARISON:  04/23/2015 and prior FINDINGS: Brain: No acute infarct or intracranial hemorrhage. No  mass lesion. No midline shift, ventriculomegaly or extra-axial fluid collection. Mild cerebral atrophy with ex vacuo dilatation. Chronic microvascular ischemic changes. Remote right frontal insult. Vascular: No hyperdense vessel or unexpected calcification. Bilateral carotid siphon atherosclerotic calcifications. Skull: Negative for fracture or focal lesion. Sinuses/Orbits: No acute orbital finding. Pneumatized paranasal sinuses and mastoid air cells. Other: Right frontal scalp hematoma. IMPRESSION: 1. No acute intracranial process.  Remote right frontal insult. 2. Mild cerebral atrophy and chronic microvascular ischemic changes. 3. Right frontal scalp hematoma. Electronically Signed   By: Primitivo Gauze M.D.   On: 03/07/2020 14:49   US Abdomen Complete  Result Date: 03/07/2020 CLINICAL DATA:  Abdominal distension EXAM: ABDOMEN ULTRASOUND COMPLETE COMPARISON:  None FINDINGS: Gallbladder: Surgically absent Common bile duct: Diameter: 5 mm, normal Liver: Normal echogenicity without mass or nodularity. Portal vein is patent on color Doppler imaging with normal direction of blood flow towards the liver. IVC: Short segment of intrahepatic IVC normal appearance, remainder obscured by bowel gas Pancreas: Portion of head to and proximal tail normal appearance, remainder obscured by bowel gas Spleen: Normal appearance, 4.8 cm length Right Kidney: Length: 9.8 cm. Normal cortical thickness. Increased cortical echogenicity. No mass or shadowing calcification. Mild hydronephrosis. Left Kidney: Length: 9.9 cm. Normal cortical thickness. Increased cortical echogenicity. No mass or calcification. Mild hydronephrosis. Abdominal aorta: Small portion of mid abdominal aorta normal caliber, remainder obscured by bowel gas Other findings: No free fluid IMPRESSION: Post cholecystectomy. Medical renal disease changes of both kidneys with mild BILATERAL hydronephrosis of uncertain etiology. Inadequate visualization of pancreas and  aorta. Electronically Signed   By: Lavonia Dana M.D.   On: 03/07/2020 18:25   CT HIP LEFT WO CONTRAST  Result Date: 03/07/2020 CLINICAL DATA:  Left hip pain after fall.  Abnormal x-ray. EXAM: CT OF THE LEFT HIP WITHOUT CONTRAST TECHNIQUE: Multidetector CT imaging of the left hip was performed according to the standard protocol. Multiplanar CT image reconstructions were also generated. COMPARISON:  X-ray 03/07/2020 FINDINGS: Bones/Joint/Cartilage Acute nondisplaced fracture of the left femoral neck and intertrochanteric region. No significant impaction or angulation. No fracture involvement of the femoral head. Severe osteoarthritis of the left hip joint with complete joint space loss, subchondral sclerosis/cystic change, and marginal  osteophyte formation. Visualized portion of the left hemipelvis is intact without fracture. The inferior aspect of the left SI joint is intact without diastasis. Ligaments Suboptimally assessed by CT. Muscles and Tendons No acute musculotendinous abnormality by CT. Soft tissues There is induration within the subcutaneous soft tissues overlying the greater trochanter without organized soft tissue collection or hematoma. Visualized prostate gland is enlarged. Scattered vascular calcifications. IMPRESSION: 1. Acute nondisplaced fracture of the left femoral neck and intertrochanteric region. 2. 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 3. Enlarged prostate gland. Electronically Signed   By: Davina Poke D.O.   On: 03/07/2020 15:31   DG C-Arm 1-60 Min-No Report  Result Date: 03/08/2020 Fluoroscopy was utilized by the requesting physician.  No radiographic interpretation.   ECHOCARDIOGRAM COMPLETE  Result Date: 03/08/2020    ECHOCARDIOGRAM REPORT   Patient Name:   John Walton Date of Exam: 03/08/2020 Medical Rec #:  [id number]       Height:       66.0 in Accession #:    [id number]      Weight:       138.4 lb Date of Birth:  [birth year]       BSA:          1.710 m Patient  Age:    14 years        BP:           120/58 mmHg Patient Gender: M               HR:           92 bpm. Exam Location:  Inpatient Procedure: 2D Echo, Cardiac Doppler and Color Doppler Indications:    Murmur 785.2 / R01.1  History:        Patient has no prior history of Echocardiogram examinations.                 Risk Factors:Hypertension, Dyslipidemia and Former Smoker.  Sonographer:    Vickie Epley RDCS Referring Phys: [place]  1. Left ventricular ejection fraction, by estimation, is 55 to 60%. The left ventricle has normal function. The left ventricle has no regional wall motion abnormalities. There is moderate left ventricular hypertrophy. Left ventricular diastolic parameters are indeterminate.  2. Right ventricular systolic function is normal. The right ventricular size is normal. Tricuspid regurgitation signal is inadequate for assessing PA pressure.  3. The mitral valve is normal in structure. No evidence of mitral valve regurgitation.  4. Aortic dilatation noted. There is mild dilatation of the aortic root, measuring 40 mm.  5. The inferior vena cava is normal in size with greater than 50% respiratory variability, suggesting right atrial pressure of 3 mmHg.  6. The aortic valve is calcified. There is moderate calcification of the aortic valve. Aortic valve regurgitation is not visualized. Mild to moderate aortic valve stenosis. Vmax 2.2 m/s, MG 12 mmHg, AVA 1.5 cm^2, DI 0.33 FINDINGS  Left Ventricle: Left ventricular ejection fraction, by estimation, is 55 to 60%. The left ventricle has normal function. The left ventricle has no regional wall motion abnormalities. The left ventricular internal cavity size was normal in size. There is  moderate left ventricular hypertrophy. Left ventricular diastolic parameters are indeterminate. Right Ventricle: The right ventricular size is normal. Right vetricular wall thickness was not assessed. Right ventricular systolic function is normal.  Tricuspid regurgitation signal is inadequate for assessing PA pressure. Left Atrium: Left atrial size was normal in size. Right Atrium: Right atrial size was  normal in size. Pericardium: Trivial pericardial effusion is present. Mitral Valve: The mitral valve is normal in structure. No evidence of mitral valve regurgitation. Tricuspid Valve: The tricuspid valve is normal in structure. Tricuspid valve regurgitation is trivial. Aortic Valve: The aortic valve is calcified. There is moderate calcification of the aortic valve. Aortic valve regurgitation is not visualized. Mild to moderate aortic stenosis is present. Aortic valve mean gradient measures 12.0 mmHg. Aortic valve peak gradient measures 19.2 mmHg. Aortic valve area, by VTI measures 1.54 cm. Pulmonic Valve: The pulmonic valve was not well visualized. Pulmonic valve regurgitation is trivial. Aorta: Aortic dilatation noted. There is mild dilatation of the aortic root, measuring 40 mm. Venous: The inferior vena cava is normal in size with greater than 50% respiratory variability, suggesting right atrial pressure of 3 mmHg. IAS/Shunts: The interatrial septum was not well visualized.  LEFT VENTRICLE PLAX 2D LVIDd:         4.90 cm      Diastology LVIDs:         3.40 cm      LV e' medial:    5.48 cm/s LV PW:         0.90 cm      LV E/e' medial:  16.3 LV IVS:        0.90 cm      LV e' lateral:   7.50 cm/s LVOT diam:     2.40 cm      LV E/e' lateral: 11.9 LV SV:         70 LV SV Index:   41 LVOT Area:     4.52 cm  LV Volumes (MOD) LV vol d, MOD A2C: 84.8 ml LV vol d, MOD A4C: 102.0 ml LV vol s, MOD A2C: 37.0 ml LV vol s, MOD A4C: 47.8 ml LV SV MOD A2C:     47.8 ml LV SV MOD A4C:     102.0 ml LV SV MOD BP:      51.4 ml RIGHT VENTRICLE RV S prime:     13.70 cm/s TAPSE (M-mode): 1.6 cm LEFT ATRIUM           Index       RIGHT ATRIUM           Index LA diam:      2.30 cm 1.34 cm/m  RA Area:     11.10 cm LA Vol (A2C): 22.1 ml 12.92 ml/m RA Volume:   25.40 ml  14.85 ml/m  LA Vol (A4C): 28.1 ml 16.43 ml/m  AORTIC VALVE AV Area (Vmax):    1.82 cm AV Area (Vmean):   1.66 cm AV Area (VTI):     1.54 cm AV Vmax:           219.00 cm/s AV Vmean:          166.000 cm/s AV VTI:            0.451 m AV Peak Grad:      19.2 mmHg AV Mean Grad:      12.0 mmHg LVOT Vmax:         88.10 cm/s LVOT Vmean:        60.900 cm/s LVOT VTI:          0.154 m LVOT/AV VTI ratio: 0.34  AORTA Ao Root diam: 4.00 cm Ao Asc diam:  3.50 cm MITRAL VALVE MV Area (PHT): 3.39 cm     SHUNTS MV Decel Time: 224 msec     Systemic VTI:  0.15 m MV E velocity: 89.10 cm/s   Systemic Diam: 2.40 cm MV A velocity: 104.00 cm/s MV E/A ratio:  0.86 Oswaldo Milian MD Electronically signed by Oswaldo Milian MD Signature Date/Time: 03/08/2020/10:08:13 AM    Final    CT RENAL STONE STUDY  Result Date: 03/09/2020 CLINICAL DATA:  Hydronephrosis, chronic kidney disease EXAM: CT ABDOMEN AND PELVIS WITHOUT CONTRAST TECHNIQUE: Multidetector CT imaging of the abdomen and pelvis was performed following the standard protocol without IV contrast. COMPARISON:  Abdominal ultrasound 03/07/2020 FINDINGS: Lower chest: Bandlike densities in the lung bases more concentrated in the right lower lobe, favoring atelectasis or scarring. Descending thoracic aortic atherosclerosis. Low-density blood pool suggests anemia. Hepatobiliary: Cholecystectomy. Pancreas: Unremarkable Spleen: Unremarkable Adrenals/Urinary Tract: 1.5 by 1.6 cm hypodense lesion of the left kidney upper pole, internal density 12 Hounsfield units on image 27 series 2, probably a cyst but technically nonspecific on today's noncontrast examination. Adrenal glands normal. Exophytic 1.3 by 1.3 cm lesion from the left kidney lower pole, internal density 10 Hounsfield units on image 43/2, probably a cyst but technically nonspecific. A Foley catheter is present in the urinary bladder which is nondistended but thick walled. Small amount of gas in the urinary bladder. 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 Vascular/Lymphatic: Aortoiliac atherosclerotic vascular disease. Reproductive: Marked prostatomegaly. Other: No supplemental non-categorized findings. Musculoskeletal: There is abnormal gas tracking along the anterior margin of the left iliopsoas muscle down towards the insertion, and also along the left gluteus musculature and left upper thigh musculature anteriorly, probably related to the patient's left hip surgery this afternoon. Indistinctness of surrounding fascia planes, likely postoperative. Overall the amount of gas and edema in this vicinity is consistent with today's earlier operation. On images 69 and 70 of series 2, there is a trace amount of gas in what appears to be a subtle cortical discontinuity along the anterior wall of the acetabulum which is not readily apparent on the CT from 03/07/2020. The possibility of a subtle fracture in this vicinity is raised, although admittedly the finding is very localized/focal. Severe osteoarthritis of the right hip. Multilevel lumbar spondylosis and degenerative disc disease causing mild degrees of foraminal impingement. IMPRESSION: 1. There is a trace amount of gas in what appears to be a subtle cortical discontinuity along the anterior wall of the acetabulum which is not readily apparent on the hip CT from 03/07/2020. The possibility of a subtle anterior acetabular wall fracture in this vicinity is raised, although admittedly the finding is very localized/focal. 2. Marked prostatomegaly. 3. Other imaging findings of potential clinical significance: Low-density blood pool suggests anemia. 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 Severe osteoarthritis of the right hip. Expected postoperative findings in the tissue surrounding the left hip prosthesis. Multilevel lumbar spondylosis and degenerative disc disease causing mild degrees of foraminal impingement.  4. Aortic atherosclerosis. Aortic Atherosclerosis (ICD10-I70.0). Electronically Signed   By: Van Clines M.D.   On: 03/09/2020 14:28   DG Hip Port Unilat With Pelvis 1V Left  Result Date: 03/08/2020 CLINICAL DATA:  86 year old male undergoing hip arthroplasty after traumatic fracture. EXAM: DG HIP (WITH OR WITHOUT PELVIS) 1V PORT LEFT COMPARISON:  Intraoperative images 16 35 hours today. FINDINGS: Portable AP view at 1715 hours. Left hip bipolar arthroplasty demonstrated on this slightly oblique view. Hardware appears intact and normally aligned. Regional postoperative soft tissue gas. Overlying skin staples. No unexpected osseous changes. IMPRESSION: Mildly oblique view of bipolar left hip arthroplasty with no adverse features. Electronically Signed  By: Genevie Ann M.D.   On: 03/08/2020 19:32   DG HIP OPERATIVE UNILAT W OR W/O PELVIS LEFT  Result Date: 03/08/2020 CLINICAL DATA:  86 year old male undergoing hip arthroplasty after traumatic fracture. EXAM: OPERATIVE LEFT HIP (WITH PELVIS IF PERFORMED) 2 VIEWS TECHNIQUE: Fluoroscopic spot image(s) were submitted for interpretation post-operatively. COMPARISON:  LEFT HIP CT 03/07/2020. FINDINGS: Two intraoperative fluoroscopic AP spot views of the left hip demonstrate bipolar arthroplasty underway. No unexpected osseous changes are visible. FLUOROSCOPY TIME:  0 minutes 2 seconds IMPRESSION: Left hip arthroplasty underway. Electronically Signed   By: Genevie Ann M.D.   On: 03/08/2020 19:31   DG Hip Unilat With Pelvis 2-3 Views Left  Result Date: 03/07/2020 CLINICAL DATA:  Left hip pain after fall EXAM: DG HIP (WITH OR WITHOUT PELVIS) 2-3V LEFT COMPARISON:  None. FINDINGS: Cortical irregularity along the lateral aspect of the left femoral neck suspicious for a nondisplaced fracture. No displacement or angulation. Severe degenerative changes of the bilateral hip joints. Pelvic bony ring intact. Advanced vascular calcifications. IMPRESSION: Findings suspicious  for a nondisplaced fracture of the left femoral neck. Electronically Signed   By: Davina Poke D.O.   On: 03/07/2020 13:04    Microbiology: Recent Results (from the past 240 hour(s))  Resp Panel by RT-PCR (Flu A&B, Covid) Nasopharyngeal Swab     Status: None   Collection Time: 03/07/20 11:57 AM   Specimen: Nasopharyngeal Swab; Nasopharyngeal(NP) swabs in vial transport medium  Result Value Ref Range Status   SARS Coronavirus 2 by RT PCR NEGATIVE NEGATIVE Final    Comment: (NOTE) SARS-CoV-2 target nucleic acids are NOT DETECTED.  The SARS-CoV-2 RNA is generally detectable in upper respiratory specimens during the acute phase of infection. The lowest concentration of SARS-CoV-2 viral copies this assay can detect is 138 copies/mL. A negative result does not preclude SARS-Cov-2 infection and should not be used as the sole basis for treatment or other patient management decisions. A negative result may occur with  improper specimen collection/handling, submission of specimen other than nasopharyngeal swab, presence of viral mutation(s) within the areas targeted by this assay, and inadequate number of viral copies(<138 copies/mL). A negative result must be combined with clinical observations, patient history, and epidemiological information. The expected result is Negative.  Fact Sheet for Patients:  EntrepreneurPulse.com.au  Fact Sheet for Healthcare Providers:  IncredibleEmployment.be  This test is no t yet approved or cleared by the [place] FDA and  has been authorized for detection and/or diagnosis of SARS-CoV-2 by FDA under an Emergency Use Authorization (EUA). This EUA will remain  in effect (meaning this test can be used) for the duration of the COVID-19 declaration under Section 564(b)(1) of the Act, 21 U.S.C.section 360bbb-3(b)(1), unless the authorization is terminated  or revoked sooner.       Influenza A by PCR NEGATIVE  NEGATIVE Final   Influenza B by PCR NEGATIVE NEGATIVE Final    Comment: (NOTE) The Xpert Xpress SARS-CoV-2/FLU/RSV plus assay is intended as an aid in the diagnosis of influenza from Nasopharyngeal swab specimens and should not be used as a sole basis for treatment. Nasal washings and aspirates are unacceptable for Xpert Xpress SARS-CoV-2/FLU/RSV testing.  Fact Sheet for Patients: EntrepreneurPulse.com.au  Fact Sheet for Healthcare Providers: IncredibleEmployment.be  This test is not yet approved or cleared by the [place] FDA and has been authorized for detection and/or diagnosis of SARS-CoV-2 by FDA under an Emergency Use Authorization (EUA). This EUA will remain in effect (meaning this test can  be used) for the duration of the COVID-19 declaration under Section 564(b)(1) of the Act, 21 U.S.C. section 360bbb-3(b)(1), unless the authorization is terminated or revoked.  Performed at [facility], [place] [address] [place], [place] [address]   Surgical PCR screen     Status: None   Collection Time: 03/08/20  9:29 AM   Specimen: Nasal Mucosa; Nasal Swab  Result Value Ref Range Status   MRSA, PCR NEGATIVE NEGATIVE Final   Staphylococcus aureus NEGATIVE NEGATIVE Final    Comment: (NOTE) The Xpert SA Assay (FDA approved for NASAL specimens in patients 62 years of age and older), is one component of a comprehensive surveillance program. It is not intended to diagnose infection nor to guide or monitor treatment. Performed at [facility], [place] [address], [place], [place] [address]      Labs: Basic Metabolic Panel: Recent Labs  Lab 03/08/20 [phone] 03/09/20 0338 03/10/20 0356 03/11/20 0332 03/12/20 0326 03/13/20 0255  NA 139 141 138 137 134* 136  K 4.1 4.7 4.2 3.9 4.2 4.2  CL 110 110 108 108 105 106  CO2 18* 19* 18* 18* 18* 17*  GLUCOSE 151* 210* 120* 103* 122* 132*  BUN 43* 47* 58* 71* 72* 69*   CREATININE 2.12* 1.96* 2.05* 2.22* 2.19* 2.19*  CALCIUM 7.8* 8.0* 7.6* 7.4* 7.5* 7.9*  MG 2.1 2.1 2.1 2.2 2.3 2.4  PHOS 4.4  --   --   --  3.9 4.2   Liver Function Tests: Recent Labs  Lab 03/09/20 0338 03/10/20 0356 03/11/20 0332 03/12/20 0326 03/13/20 0255  AST 27 25 24 24 26   ALT 14 13 12 12 14   ALKPHOS 64 60 57 60 67  BILITOT 0.7 0.6 0.9 0.8 0.8  PROT 6.8 6.3* 5.8* 6.1* 6.5  ALBUMIN 3.6 3.2* 2.8* 2.8* 3.0*   No results for input(s): LIPASE, AMYLASE in the last 168 hours. No results for input(s): AMMONIA in the last 168 hours. CBC: Recent Labs  Lab 03/09/20 0338 03/10/20 0356 03/11/20 0332 03/12/20 0326 03/13/20 0255  WBC 9.1 11.7* 7.6 7.3 8.5  NEUTROABS 8.4* 9.6* 5.9 5.8 6.6  HGB 9.4* 8.4* 7.5* 7.9* 8.4*  HCT 29.2* 26.0* 23.3* 24.1* 26.3*  MCV 89.0 88.7 88.9 88.9 88.6  PLT 164 183 179 217 278   Cardiac Enzymes: No results for input(s): CKTOTAL, CKMB, CKMBINDEX, TROPONINI in the last 168 hours. BNP: BNP (last 3 results) No results for input(s): BNP in the last 8760 hours.  ProBNP (last 3 results) No results for input(s): PROBNP in the last 8760 hours.  CBG: Recent Labs  Lab 03/12/20 1945 03/12/20 2337 03/13/20 0357 03/13/20 0745 03/13/20 1142  GLUCAP 116* 116* 123* 109* 124*       Signed:  Dia Crawford, MD [facility] [phone] pager

## 2020-03-13 NOTE — TOC Transition Note (Signed)
Transition of Care [facility]) - CM/SW Discharge Note   Patient Details  Name: ALBEN JEPSEN MRN: [id number] Date of Birth: [birth year]  [facility] CM/SW Contact:  Lia Hopping, [place] Phone Number: 03/13/2020, 10:58 AM   Clinical Narrative:    [facility] ready to accept the patient today.  Rapid covid test ordered.  Nurse call report: [phone] Room 7005 PTAR arranged to transport.    Final next level of care: Skilled Nursing Facility Barriers to Discharge: Barriers Resolved   Patient Goals and CMS Choice   CMS Medicare.gov Compare Post Acute Care list provided to:: Patient Choice offered to / list presented to : Patient,Spouse  Discharge Placement PASRR number recieved: 03/09/20            Patient chooses bed at: [facility] Patient to be transferred to facility by: [facility] Name of family member notified: Spouse [name] Patient and family notified of of transfer: 03/13/20  Discharge Plan and Services In-house Referral: Clinical Social Work Discharge Planning Services: CM Consult                                 Social Determinants of Health (SDOH) Interventions     Readmission Risk Interventions No flowsheet data found.

## 2020-03-13 NOTE — Progress Notes (Signed)
Results for DIAMONTE, STAVELY (MRN [id number]) as of 03/11/2020 15:09  Ref. Range 03/07/2020 17:00  Hemoglobin A1C Latest Ref Range: 4.8 - 5.6 % 6.7 (H)   Results for MICKAEL, MCNUTT (MRN [id number]) as of 03/13/2020 13:07  Ref. Range 03/12/2020 07:23 03/12/2020 11:42 03/12/2020 16:12 03/12/2020 19:45  Glucose-Capillary Latest Ref Range: 70 - 99 mg/dL 107 (H) 119 (H) 113 (H) 116 (H)    Admit with: Left femoral neck intertroch fracture after fall  History: DM  Home DM Meds: None--Diet and exercise per MD notes  Current Orders: Novolog Sensitive Correction Scale/ SSI (0-9 units) Q4 hours      Lantus 5 units Daily    MD- Please consider d/c of Lantus.  CBGs have been well controlled on minimal amount of Novolog SSI.    Met with pt and wife this AM.  We reviewed current A1c of 6.7% (what this measures, goal, etc).  Pt is currently at goal A1c per ADA Standards of care.  Takes no DM meds at home and does not appear to need any.    Wife had several questions about nutrition for pt at home.  We reviewed goal nutrition at home for pts living with diabetes.  Encouraged patient to avoid beverages with sugar (regular soda, sweet tea, lemonade, fruit juice) and to consume mostly water.  Discussed what foods contain carbohydrates and how carbohydrates affect the body's blood sugar levels.  Encouraged patient to be careful with his portion sizes (especially grains, starchy vegetables, and fruits).  Wife told me pt only eats small amount of fresh fruit in the AM.  She prepares chicken and fresh vegetables with other meals.  Asked me about peas and corn and potatoes.  We discussed the need to eat much smaller portions of these carbohydrate rich veggies.  Pt does not eat sweets and only drinks coffee (no sugar or milk), water, and fizzy waters with no calories.  Commended pt and wife for achieving such good control of CBGs at home with current regimen.      --Will follow patient during  hospitalization--  Wyn Quaker RN, MSN, CDE Diabetes Coordinator Inpatient Glycemic Control Team Team Pager: [phone] (8a-5p)

## 2020-03-13 NOTE — Progress Notes (Signed)
Pt transported to [place] via [facility] in stable condition.

## 2020-03-13 NOTE — Progress Notes (Signed)
   Subjective: 5 Days Post-Op Procedure(s) (LRB): TOTAL HIP ARTHROPLASTY (Left) Patient reports pain as mild and moderate.    Objective: Vital signs in last 24 hours: Temp:  [98.2 F (36.8 C)-98.8 F (37.1 C)] 98.5 F (36.9 C) (12/13 0358) Pulse Rate:  [90-100] 100 (12/13 0358) Resp:  [16-17] 16 (12/13 0358) BP: (120-133)/(47-61) 128/61 (12/13 0358) SpO2:  [88 %-91 %] 91 % (12/13 0358)  Intake/Output from previous day: 12/12 0701 - 12/13 0700 In: 840 [P.O.:840] Out: 1660 [Urine:1660] Intake/Output this shift: Total I/O In: 25 [P.O.:25] Out: 200 [Urine:200]  Recent Labs    03/11/20 0332 03/12/20 0326 03/13/20 0255  HGB 7.5* 7.9* 8.4*   Recent Labs    03/12/20 0326 03/13/20 0255  WBC 7.3 8.5  RBC 2.71* 2.97*  HCT 24.1* 26.3*  PLT 217 278   Recent Labs    03/12/20 0326 03/13/20 0255  NA 134* 136  K 4.2 4.2  CL 105 106  CO2 18* 17*  BUN 72* 69*  CREATININE 2.19* 2.19*  GLUCOSE 122* 132*  CALCIUM 7.5* 7.9*   No results for input(s): LABPT, INR in the last 72 hours.  Neurologically intact No results found.  Assessment/Plan: 5 Days Post-Op Procedure(s) (LRB): TOTAL HIP ARTHROPLASTY (Left) Up with therapy, SNF.  Has walked almost to door and back.   Marybelle Killings 03/13/2020, 10:50 AM

## 2020-03-13 NOTE — Progress Notes (Signed)
Physical Therapy Treatment Patient Details Name: John Walton MRN: [id number] DOB: 04/02/1932 Today's Date: 03/13/2020    History of Present Illness [name]    PT Comments    POD # 5 Assisted OOB to attempt amb was unsuccessful.  General bed mobility comments: cues for sequence and use of R LE to self assist.  Physical assist to manage LEs and to bring trunk to upright and complete rotation to EOB sitting with pad.  General transfer comment: very unsteady with severe posterior lean.  Poor self correction to midline.  HIGH FALL RISK  General Gait Details: cues for sequence, posture, increased UE WB and position from RW  VERY UNSTEADY.  NEAR fall if recliner was not following. Pt will need ST Rehab at SNF to regain prior level of mobility.   Follow Up Recommendations  SNF     Equipment Recommendations  None recommended by PT    Recommendations for Other Services       Precautions / Restrictions Precautions Precautions: Fall;Posterior Hip Precaution Comments: Pt recalls no THP - all precautions reviewed with pt and spouse Required Braces or Orthoses: Knee Immobilizer - Left Knee Immobilizer - Left: On at all times Restrictions Weight Bearing Restrictions: No LLE Weight Bearing: Weight bearing as tolerated    Mobility  Bed Mobility Overal bed mobility: Needs Assistance Bed Mobility: Supine to Sit     Supine to sit: Mod assist     General bed mobility comments: cues for sequence and use of R LE to self assist.  Physical assist to manage LEs and to bring trunk to upright and complete rotation to EOB sitting with pad  Transfers Overall transfer level: Needs assistance Equipment used: Rolling walker (2 wheeled) Transfers: Sit to/from [facility] Sit to Stand: Mod assist;+2 physical assistance;+2 safety/equipment;From elevated surface Stand pivot transfers: Mod assist;+2 physical assistance        General transfer comment: very unsteady with severe posterior lean.  Poor self correction to midline.  HIGH FALL RISK  Ambulation/Gait Ambulation/Gait assistance: Mod assist;+2 physical assistance;+2 safety/equipment Gait Distance (Feet): 1 Feet Assistive device: Rolling walker (2 wheeled) Gait Pattern/deviations: Step-to pattern;Decreased step length - right;Decreased step length - left;Shuffle;Trunk flexed;Antalgic Gait velocity: decr   General Gait Details: cues for sequence, posture, increased UE WB and position from RW  VERY UNSTEADY.  NEAR fall if recliner was not following.   Stairs             Wheelchair Mobility    Modified Rankin (Stroke Patients Only)       Balance                                            Cognition Arousal/Alertness: Awake/alert Behavior During Therapy: WFL for tasks assessed/performed Overall Cognitive Status: Within Functional Limits for tasks assessed                                 General Comments: AxO x 3 following all commands 25% needed repeating      Exercises      General Comments        Pertinent Vitals/Pain Pain Assessment: Faces Faces Pain Scale: Hurts little more Pain Location: L hip with movement Pain Descriptors / Indicators: Aching;Sore;Tightness;Tender Pain  Intervention(s): Premedicated before session;Monitored during session;Repositioned;Ice applied    Home Living                      Prior Function            PT Goals (current goals can now be found in the care plan section) Progress towards PT goals: Progressing toward goals    Frequency    Min 3X/week      PT Plan Current plan remains appropriate    Co-evaluation              AM-PAC PT "6 Clicks" Mobility   Outcome Measure  Help needed turning from your back to your side while in a flat bed without using bedrails?: A Lot Help needed moving from lying on your back to sitting on the side of  a flat bed without using bedrails?: A Lot Help needed moving to and from a bed to a chair (including a wheelchair)?: A Lot Help needed standing up from a chair using your arms (e.g., wheelchair or bedside chair)?: A Lot Help needed to walk in hospital room?: Total Help needed climbing 3-5 steps with a railing? : Total 6 Click Score: 10    End of Session Equipment Utilized During Treatment: Gait belt Activity Tolerance: Patient tolerated treatment well Patient left: in chair;with call bell/phone within reach Nurse Communication: Mobility status PT Visit Diagnosis: Difficulty in walking, not elsewhere classified (R26.2);History of falling (Z91.81);Unsteadiness on feet (R26.81);Pain Pain - Right/Left: Left Pain - part of body: Hip     Time: 2010-0712 PT Time Calculation (min) (ACUTE ONLY): 28 min  Charges:  $Gait Training: 8-22 mins $Therapeutic Exercise: 8-22 mins                     Rica Koyanagi  PTA Acute  Rehabilitation Services Pager      [phone] Office      [phone]

## 2020-03-17 DIAGNOSIS — K2950 Unspecified chronic gastritis without bleeding: Secondary | ICD-10-CM | POA: Diagnosis not present

## 2020-03-18 DIAGNOSIS — E870 Hyperosmolality and hypernatremia: Secondary | ICD-10-CM | POA: Diagnosis not present

## 2020-03-18 DIAGNOSIS — N139 Obstructive and reflux uropathy, unspecified: Secondary | ICD-10-CM | POA: Diagnosis not present

## 2020-03-18 DIAGNOSIS — N179 Acute kidney failure, unspecified: Secondary | ICD-10-CM | POA: Diagnosis not present

## 2020-03-18 DIAGNOSIS — E872 Acidosis: Secondary | ICD-10-CM | POA: Diagnosis not present

## 2020-03-18 DIAGNOSIS — D649 Anemia, unspecified: Secondary | ICD-10-CM | POA: Diagnosis not present

## 2020-03-18 DIAGNOSIS — N189 Chronic kidney disease, unspecified: Secondary | ICD-10-CM | POA: Diagnosis not present

## 2020-03-18 DIAGNOSIS — N390 Urinary tract infection, site not specified: Secondary | ICD-10-CM | POA: Diagnosis not present

## 2020-03-19 DIAGNOSIS — N390 Urinary tract infection, site not specified: Secondary | ICD-10-CM | POA: Diagnosis not present

## 2020-03-19 DIAGNOSIS — E870 Hyperosmolality and hypernatremia: Secondary | ICD-10-CM | POA: Diagnosis not present

## 2020-03-19 DIAGNOSIS — N189 Chronic kidney disease, unspecified: Secondary | ICD-10-CM | POA: Diagnosis not present

## 2020-03-19 DIAGNOSIS — E872 Acidosis: Secondary | ICD-10-CM | POA: Diagnosis not present

## 2020-03-19 DIAGNOSIS — N179 Acute kidney failure, unspecified: Secondary | ICD-10-CM | POA: Diagnosis not present

## 2020-03-19 DIAGNOSIS — N139 Obstructive and reflux uropathy, unspecified: Secondary | ICD-10-CM | POA: Diagnosis not present

## 2020-03-19 DIAGNOSIS — D649 Anemia, unspecified: Secondary | ICD-10-CM | POA: Diagnosis not present

## 2020-03-22 ENCOUNTER — Other Ambulatory Visit: Payer: Self-pay | Admitting: *Deleted

## 2020-03-22 NOTE — Patient Outreach (Signed)
[facility]) Care Management  03/22/2020  MARVEL MCPHILLIPS 06-13-1932 [id number]   Notification received from care management assistant that member triggered red on EMMI dashboard, however noted that member is admitted to [place] notified, will await further instructions for follow up pending his disposition.  Valente David, [place], MSN [place] [phone]

## 2020-03-23 DIAGNOSIS — R509 Fever, unspecified: Secondary | ICD-10-CM | POA: Diagnosis not present

## 2020-03-23 DIAGNOSIS — R1310 Dysphagia, unspecified: Secondary | ICD-10-CM | POA: Diagnosis present

## 2020-03-23 DIAGNOSIS — R5381 Other malaise: Secondary | ICD-10-CM | POA: Diagnosis present

## 2020-03-23 DIAGNOSIS — M2550 Pain in unspecified joint: Secondary | ICD-10-CM | POA: Diagnosis not present

## 2020-03-23 DIAGNOSIS — I129 Hypertensive chronic kidney disease with stage 1 through stage 4 chronic kidney disease, or unspecified chronic kidney disease: Secondary | ICD-10-CM | POA: Diagnosis not present

## 2020-03-23 DIAGNOSIS — Z96642 Presence of left artificial hip joint: Secondary | ICD-10-CM | POA: Diagnosis present

## 2020-03-23 DIAGNOSIS — K224 Dyskinesia of esophagus: Secondary | ICD-10-CM | POA: Diagnosis present

## 2020-03-23 DIAGNOSIS — S72002D Fracture of unspecified part of neck of left femur, subsequent encounter for closed fracture with routine healing: Secondary | ICD-10-CM | POA: Diagnosis not present

## 2020-03-23 DIAGNOSIS — M4812 Ankylosing hyperostosis [Forestier], cervical region: Secondary | ICD-10-CM | POA: Diagnosis not present

## 2020-03-23 DIAGNOSIS — Z7982 Long term (current) use of aspirin: Secondary | ICD-10-CM | POA: Diagnosis not present

## 2020-03-23 DIAGNOSIS — Z7401 Bed confinement status: Secondary | ICD-10-CM | POA: Diagnosis not present

## 2020-03-23 DIAGNOSIS — R1319 Other dysphagia: Secondary | ICD-10-CM | POA: Diagnosis present

## 2020-03-23 DIAGNOSIS — N179 Acute kidney failure, unspecified: Secondary | ICD-10-CM | POA: Diagnosis not present

## 2020-03-23 DIAGNOSIS — Z9181 History of falling: Secondary | ICD-10-CM | POA: Diagnosis not present

## 2020-03-23 DIAGNOSIS — E7801 Familial hypercholesterolemia: Secondary | ICD-10-CM | POA: Diagnosis not present

## 2020-03-23 DIAGNOSIS — I959 Hypotension, unspecified: Secondary | ICD-10-CM | POA: Diagnosis not present

## 2020-03-23 DIAGNOSIS — Z794 Long term (current) use of insulin: Secondary | ICD-10-CM | POA: Diagnosis not present

## 2020-03-23 DIAGNOSIS — D649 Anemia, unspecified: Secondary | ICD-10-CM | POA: Diagnosis not present

## 2020-03-23 DIAGNOSIS — Z20822 Contact with and (suspected) exposure to covid-19: Secondary | ICD-10-CM | POA: Diagnosis present

## 2020-03-23 DIAGNOSIS — E876 Hypokalemia: Secondary | ICD-10-CM | POA: Diagnosis not present

## 2020-03-23 DIAGNOSIS — I4510 Unspecified right bundle-branch block: Secondary | ICD-10-CM | POA: Diagnosis not present

## 2020-03-23 DIAGNOSIS — H409 Unspecified glaucoma: Secondary | ICD-10-CM | POA: Diagnosis not present

## 2020-03-23 DIAGNOSIS — E119 Type 2 diabetes mellitus without complications: Secondary | ICD-10-CM | POA: Diagnosis not present

## 2020-03-23 DIAGNOSIS — N400 Enlarged prostate without lower urinary tract symptoms: Secondary | ICD-10-CM | POA: Diagnosis not present

## 2020-03-23 DIAGNOSIS — Z87891 Personal history of nicotine dependence: Secondary | ICD-10-CM | POA: Diagnosis not present

## 2020-03-23 DIAGNOSIS — Z466 Encounter for fitting and adjustment of urinary device: Secondary | ICD-10-CM | POA: Diagnosis not present

## 2020-03-23 DIAGNOSIS — Z79899 Other long term (current) drug therapy: Secondary | ICD-10-CM | POA: Diagnosis not present

## 2020-03-23 DIAGNOSIS — R1314 Dysphagia, pharyngoesophageal phase: Secondary | ICD-10-CM | POA: Diagnosis present

## 2020-03-23 DIAGNOSIS — D509 Iron deficiency anemia, unspecified: Secondary | ICD-10-CM | POA: Diagnosis present

## 2020-03-23 DIAGNOSIS — Z888 Allergy status to other drugs, medicaments and biological substances status: Secondary | ICD-10-CM | POA: Diagnosis not present

## 2020-03-23 DIAGNOSIS — F1021 Alcohol dependence, in remission: Secondary | ICD-10-CM | POA: Diagnosis present

## 2020-03-23 DIAGNOSIS — E785 Hyperlipidemia, unspecified: Secondary | ICD-10-CM | POA: Diagnosis not present

## 2020-03-23 DIAGNOSIS — E43 Unspecified severe protein-calorie malnutrition: Secondary | ICD-10-CM | POA: Diagnosis present

## 2020-03-23 DIAGNOSIS — I10 Essential (primary) hypertension: Secondary | ICD-10-CM | POA: Diagnosis not present

## 2020-03-23 DIAGNOSIS — K5900 Constipation, unspecified: Secondary | ICD-10-CM | POA: Diagnosis not present

## 2020-03-23 DIAGNOSIS — E870 Hyperosmolality and hypernatremia: Secondary | ICD-10-CM | POA: Diagnosis present

## 2020-03-23 DIAGNOSIS — N184 Chronic kidney disease, stage 4 (severe): Secondary | ICD-10-CM | POA: Diagnosis not present

## 2020-03-23 DIAGNOSIS — M2578 Osteophyte, vertebrae: Secondary | ICD-10-CM | POA: Diagnosis present

## 2020-03-23 DIAGNOSIS — Z6821 Body mass index (BMI) 21.0-21.9, adult: Secondary | ICD-10-CM | POA: Diagnosis not present

## 2020-03-23 DIAGNOSIS — E1122 Type 2 diabetes mellitus with diabetic chronic kidney disease: Secondary | ICD-10-CM | POA: Diagnosis not present

## 2020-03-26 ENCOUNTER — Inpatient Hospital Stay (HOSPITAL_COMMUNITY)
Admission: EM | Admit: 2020-03-26 | Discharge: 2020-04-04 | DRG: 391 | Disposition: A | Discharge status: Skilled Nursing Facility | Payer: No Typology Code available for payment source | Attending: Internal Medicine | Admitting: Internal Medicine

## 2020-03-26 ENCOUNTER — Other Ambulatory Visit: Payer: Self-pay

## 2020-03-26 ENCOUNTER — Encounter (HOSPITAL_COMMUNITY): Payer: Self-pay

## 2020-03-26 ENCOUNTER — Emergency Department (HOSPITAL_COMMUNITY): Payer: No Typology Code available for payment source

## 2020-03-26 DIAGNOSIS — M2578 Osteophyte, vertebrae: Secondary | ICD-10-CM | POA: Diagnosis present

## 2020-03-26 DIAGNOSIS — E440 Moderate protein-calorie malnutrition: Secondary | ICD-10-CM | POA: Diagnosis not present

## 2020-03-26 DIAGNOSIS — Z833 Family history of diabetes mellitus: Secondary | ICD-10-CM

## 2020-03-26 DIAGNOSIS — E876 Hypokalemia: Secondary | ICD-10-CM | POA: Diagnosis present

## 2020-03-26 DIAGNOSIS — Z6821 Body mass index (BMI) 21.0-21.9, adult: Secondary | ICD-10-CM | POA: Diagnosis not present

## 2020-03-26 DIAGNOSIS — E119 Type 2 diabetes mellitus without complications: Secondary | ICD-10-CM | POA: Diagnosis not present

## 2020-03-26 DIAGNOSIS — Z794 Long term (current) use of insulin: Secondary | ICD-10-CM | POA: Diagnosis not present

## 2020-03-26 DIAGNOSIS — N184 Chronic kidney disease, stage 4 (severe): Secondary | ICD-10-CM | POA: Diagnosis not present

## 2020-03-26 DIAGNOSIS — R1314 Dysphagia, pharyngoesophageal phase: Secondary | ICD-10-CM | POA: Diagnosis present

## 2020-03-26 DIAGNOSIS — E1122 Type 2 diabetes mellitus with diabetic chronic kidney disease: Secondary | ICD-10-CM | POA: Diagnosis present

## 2020-03-26 DIAGNOSIS — E785 Hyperlipidemia, unspecified: Secondary | ICD-10-CM | POA: Diagnosis present

## 2020-03-26 DIAGNOSIS — D509 Iron deficiency anemia, unspecified: Secondary | ICD-10-CM | POA: Diagnosis present

## 2020-03-26 DIAGNOSIS — L89619 Pressure ulcer of right heel, unspecified stage: Secondary | ICD-10-CM | POA: Diagnosis present

## 2020-03-26 DIAGNOSIS — F1021 Alcohol dependence, in remission: Secondary | ICD-10-CM | POA: Diagnosis present

## 2020-03-26 DIAGNOSIS — I129 Hypertensive chronic kidney disease with stage 1 through stage 4 chronic kidney disease, or unspecified chronic kidney disease: Secondary | ICD-10-CM | POA: Diagnosis present

## 2020-03-26 DIAGNOSIS — R531 Weakness: Secondary | ICD-10-CM | POA: Diagnosis present

## 2020-03-26 DIAGNOSIS — R1319 Other dysphagia: Secondary | ICD-10-CM | POA: Diagnosis present

## 2020-03-26 DIAGNOSIS — Z79899 Other long term (current) drug therapy: Secondary | ICD-10-CM | POA: Diagnosis not present

## 2020-03-26 DIAGNOSIS — M4812 Ankylosing hyperostosis [Forestier], cervical region: Secondary | ICD-10-CM | POA: Diagnosis not present

## 2020-03-26 DIAGNOSIS — I10 Essential (primary) hypertension: Secondary | ICD-10-CM | POA: Diagnosis present

## 2020-03-26 DIAGNOSIS — N400 Enlarged prostate without lower urinary tract symptoms: Secondary | ICD-10-CM | POA: Diagnosis present

## 2020-03-26 DIAGNOSIS — Z7982 Long term (current) use of aspirin: Secondary | ICD-10-CM | POA: Diagnosis not present

## 2020-03-26 DIAGNOSIS — E559 Vitamin D deficiency, unspecified: Secondary | ICD-10-CM | POA: Diagnosis present

## 2020-03-26 DIAGNOSIS — K5900 Constipation, unspecified: Secondary | ICD-10-CM | POA: Diagnosis not present

## 2020-03-26 DIAGNOSIS — D649 Anemia, unspecified: Secondary | ICD-10-CM | POA: Diagnosis not present

## 2020-03-26 DIAGNOSIS — Z87891 Personal history of nicotine dependence: Secondary | ICD-10-CM

## 2020-03-26 DIAGNOSIS — S72002D Fracture of unspecified part of neck of left femur, subsequent encounter for closed fracture with routine healing: Secondary | ICD-10-CM | POA: Diagnosis not present

## 2020-03-26 DIAGNOSIS — R509 Fever, unspecified: Secondary | ICD-10-CM | POA: Diagnosis present

## 2020-03-26 DIAGNOSIS — R633 Feeding difficulties, unspecified: Secondary | ICD-10-CM | POA: Diagnosis not present

## 2020-03-26 DIAGNOSIS — E43 Unspecified severe protein-calorie malnutrition: Secondary | ICD-10-CM | POA: Diagnosis present

## 2020-03-26 DIAGNOSIS — E7801 Familial hypercholesterolemia: Secondary | ICD-10-CM | POA: Diagnosis not present

## 2020-03-26 DIAGNOSIS — H409 Unspecified glaucoma: Secondary | ICD-10-CM | POA: Diagnosis present

## 2020-03-26 DIAGNOSIS — Z20822 Contact with and (suspected) exposure to covid-19: Secondary | ICD-10-CM | POA: Diagnosis present

## 2020-03-26 DIAGNOSIS — K224 Dyskinesia of esophagus: Principal | ICD-10-CM | POA: Diagnosis present

## 2020-03-26 DIAGNOSIS — N179 Acute kidney failure, unspecified: Secondary | ICD-10-CM | POA: Diagnosis not present

## 2020-03-26 DIAGNOSIS — E870 Hyperosmolality and hypernatremia: Secondary | ICD-10-CM | POA: Diagnosis present

## 2020-03-26 DIAGNOSIS — E860 Dehydration: Secondary | ICD-10-CM | POA: Diagnosis present

## 2020-03-26 DIAGNOSIS — Z96642 Presence of left artificial hip joint: Secondary | ICD-10-CM | POA: Diagnosis present

## 2020-03-26 DIAGNOSIS — R1310 Dysphagia, unspecified: Secondary | ICD-10-CM

## 2020-03-26 DIAGNOSIS — Z888 Allergy status to other drugs, medicaments and biological substances status: Secondary | ICD-10-CM | POA: Diagnosis not present

## 2020-03-26 DIAGNOSIS — R5381 Other malaise: Secondary | ICD-10-CM | POA: Diagnosis present

## 2020-03-26 LAB — CBC WITH DIFFERENTIAL/PLATELET
Abs Immature Granulocytes: 0.1 10*3/uL — ABNORMAL HIGH (ref 0.00–0.07)
Basophils Absolute: 0 10*3/uL (ref 0.0–0.1)
Basophils Relative: 0 %
Eosinophils Absolute: 0 10*3/uL (ref 0.0–0.5)
Eosinophils Relative: 0 %
HCT: 31.2 % — ABNORMAL LOW (ref 39.0–52.0)
Hemoglobin: 9.8 g/dL — ABNORMAL LOW (ref 13.0–17.0)
Immature Granulocytes: 1 %
Lymphocytes Relative: 3 %
Lymphs Abs: 0.4 10*3/uL — ABNORMAL LOW (ref 0.7–4.0)
MCH: 28.7 pg (ref 26.0–34.0)
MCHC: 31.4 g/dL (ref 30.0–36.0)
MCV: 91.2 fL (ref 80.0–100.0)
Monocytes Absolute: 1.1 10*3/uL — ABNORMAL HIGH (ref 0.1–1.0)
Monocytes Relative: 8 %
Neutro Abs: 11.8 10*3/uL — ABNORMAL HIGH (ref 1.7–7.7)
Neutrophils Relative %: 88 %
Platelets: 415 10*3/uL — ABNORMAL HIGH (ref 150–400)
RBC: 3.42 MIL/uL — ABNORMAL LOW (ref 4.22–5.81)
RDW: 13.8 % (ref 11.5–15.5)
WBC: 13.3 10*3/uL — ABNORMAL HIGH (ref 4.0–10.5)
nRBC: 0 % (ref 0.0–0.2)

## 2020-03-26 LAB — CREATININE, SERUM
Creatinine, Ser: 1.7 mg/dL — ABNORMAL HIGH (ref 0.61–1.24)
GFR, Estimated: 39 mL/min — ABNORMAL LOW

## 2020-03-26 LAB — CBC
HCT: 30.4 % — ABNORMAL LOW (ref 39.0–52.0)
Hemoglobin: 9.2 g/dL — ABNORMAL LOW (ref 13.0–17.0)
MCH: 28.5 pg (ref 26.0–34.0)
MCHC: 30.3 g/dL (ref 30.0–36.0)
MCV: 94.1 fL (ref 80.0–100.0)
Platelets: 383 10*3/uL (ref 150–400)
RBC: 3.23 MIL/uL — ABNORMAL LOW (ref 4.22–5.81)
RDW: 13.8 % (ref 11.5–15.5)
WBC: 9.5 10*3/uL (ref 4.0–10.5)
nRBC: 0 % (ref 0.0–0.2)

## 2020-03-26 LAB — BASIC METABOLIC PANEL
Anion gap: 13 (ref 5–15)
BUN: 33 mg/dL — ABNORMAL HIGH (ref 8–23)
CO2: 21 mmol/L — ABNORMAL LOW (ref 22–32)
Calcium: 8.2 mg/dL — ABNORMAL LOW (ref 8.9–10.3)
Chloride: 112 mmol/L — ABNORMAL HIGH (ref 98–111)
Creatinine, Ser: 1.9 mg/dL — ABNORMAL HIGH (ref 0.61–1.24)
GFR, Estimated: 34 mL/min — ABNORMAL LOW (ref >60)
Glucose, Bld: 86 mg/dL (ref 70–99)
Potassium: 3.8 mmol/L (ref 3.5–5.1)
Sodium: 146 mmol/L — ABNORMAL HIGH (ref 135–145)

## 2020-03-26 LAB — IRON AND TIBC
Iron: 18 ug/dL — ABNORMAL LOW (ref 45–182)
Saturation Ratios: 9 % — ABNORMAL LOW (ref 17.9–39.5)
TIBC: 195 ug/dL — ABNORMAL LOW (ref 250–450)
UIBC: 177 ug/dL

## 2020-03-26 LAB — HEPATIC FUNCTION PANEL
ALT: 31 U/L (ref 0–44)
AST: 28 U/L (ref 15–41)
Albumin: 2.8 g/dL — ABNORMAL LOW (ref 3.5–5.0)
Alkaline Phosphatase: 83 U/L (ref 38–126)
Bilirubin, Direct: 0.1 mg/dL (ref 0.0–0.2)
Indirect Bilirubin: 0.8 mg/dL (ref 0.3–0.9)
Total Bilirubin: 0.9 mg/dL (ref 0.3–1.2)
Total Protein: 6.7 g/dL (ref 6.5–8.1)

## 2020-03-26 LAB — RESP PANEL BY RT-PCR (FLU A&B, COVID) ARPGX2
Influenza A by PCR: NEGATIVE
Influenza B by PCR: NEGATIVE
SARS Coronavirus 2 by RT PCR: NEGATIVE

## 2020-03-26 LAB — FERRITIN: Ferritin: 112 ng/mL (ref 24–336)

## 2020-03-26 MED ORDER — POLYETHYL GLYCOL-PROPYL GLYCOL 0.4-0.3 % OP GEL: 1.0000 "application " | Freq: Two times a day (BID) | OPHTHALMIC | Status: DC

## 2020-03-26 MED ORDER — MORPHINE SULFATE (PF) 2 MG/ML IV SOLN: 2.0000 mg | INTRAVENOUS | Status: DC | PRN

## 2020-03-26 MED ORDER — HEPARIN SODIUM (PORCINE) 5000 UNIT/ML IJ SOLN
5000.0000 [IU] | Freq: Three times a day (TID) | INTRAMUSCULAR | Status: DC
  Administered 2020-03-26 – 2020-04-04 (×25): 5000 [IU] via SUBCUTANEOUS
  Filled 2020-03-26 (×25): qty 1

## 2020-03-26 MED ORDER — POLYVINYL ALCOHOL 1.4 % OP SOLN
1.0000 [drp] | Freq: Three times a day (TID) | OPHTHALMIC | Status: DC
  Administered 2020-03-26 – 2020-04-04 (×26): 1 [drp] via OPHTHALMIC
  Filled 2020-03-26: qty 15

## 2020-03-26 MED ORDER — ALUM & MAG HYDROXIDE-SIMETH 200-200-20 MG/5ML PO SUSP
30.0000 mL | Freq: Once | ORAL | Status: DC
  Filled 2020-03-26: qty 30

## 2020-03-26 MED ORDER — SODIUM CHLORIDE 0.9 % IV BOLUS
1000.0000 mL | Freq: Once | INTRAVENOUS | Status: AC
  Administered 2020-03-26: 1000 mL via INTRAVENOUS

## 2020-03-26 MED ORDER — ACETAMINOPHEN 650 MG RE SUPP: 650.0000 mg | Freq: Four times a day (QID) | RECTAL | Status: DC | PRN

## 2020-03-26 MED ORDER — PANTOPRAZOLE SODIUM 40 MG IV SOLR
40.0000 mg | Freq: Once | INTRAVENOUS | Status: AC
  Administered 2020-03-26: 40 mg via INTRAVENOUS
  Filled 2020-03-26: qty 40

## 2020-03-26 MED ORDER — LIDOCAINE VISCOUS HCL 2 % MT SOLN
15.0000 mL | Freq: Once | OROMUCOSAL | Status: DC
  Filled 2020-03-26: qty 15

## 2020-03-26 MED ORDER — DIPHENHYDRAMINE HCL 50 MG/ML IJ SOLN
25.0000 mg | Freq: Once | INTRAMUSCULAR | Status: AC
  Administered 2020-03-26: 25 mg via INTRAVENOUS
  Filled 2020-03-26: qty 1

## 2020-03-26 MED ORDER — LATANOPROST 0.005 % OP SOLN
1.0000 [drp] | Freq: Every day | OPHTHALMIC | Status: DC
  Administered 2020-03-26 – 2020-04-03 (×9): 1 [drp] via OPHTHALMIC
  Filled 2020-03-26: qty 2.5

## 2020-03-26 MED ORDER — ACETAMINOPHEN 325 MG PO TABS: 650.0000 mg | ORAL_TABLET | Freq: Four times a day (QID) | ORAL | Status: DC | PRN

## 2020-03-26 MED ORDER — SODIUM CHLORIDE 0.9 % IV SOLN: INTRAVENOUS | Status: DC

## 2020-03-26 NOTE — ED Provider Notes (Signed)
[facility] DEPT Provider Note   CSN: [id number] Arrival date & time: 03/26/20  0847     History Chief Complaint  Patient presents with  . Dysphagia    John Walton is a 86 y.o. male.  Patient here with difficulty swallowing.  Has had this problem for the last several weeks maybe longer.  Just had a hospital admission at [facility] regional for the same with extensive work-up including EGD, imaging of his head and neck.  Supposedly he is having issues with swallowing likely multifactorial secondary to enlarged thyroid and a neck osteophyte and may be some mild esophagitis.  Still continues to struggle with p.o. also underwent extensive speech and swallow evaluation and was cleared for liquid/dysphagia diet.  But over the last 3 days supposedly he has not had much to eat or drink as he cannot tolerate it.  The history is provided by the patient and medical records.  Illness Severity:  Mild Onset quality:  Gradual Timing:  Constant Chronicity:  Chronic Relieved by:  Nothing Worsened by:  Nothing  Associated symptoms: no abdominal pain, no chest pain, no congestion, no cough, no diarrhea, no ear pain, no fatigue, no fever, no headaches, no loss of consciousness, no myalgias, no nausea, no rash, no rhinorrhea, no shortness of breath, no sore throat, no vomiting and no wheezing        Past Medical History:  Diagnosis Date  . Chicken pox   . Frequent headaches   . Glaucoma   . Hay fever   . History of blood transfusion   . HTN (hypertension)   . Hyperlipidemia   . Hyperlipidemia   . Migraines   . 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[facility])     Patient Active Problem List   Diagnosis Date Noted  . Dysphagia 03/26/2020  . Bilateral hydronephrosis 03/08/2020  . Unilateral primary osteoarthritis, left hip   . Femoral neck fracture ([place]) 03/07/2020  . CKD (chronic kidney disease), stage IV ([place]) 03/07/2020  . Normocytic anemia 03/07/2020  . BPH  (benign prostatic hyperplasia) 03/07/2020  . Abdominal mass 03/07/2020  . Scalp hematoma 03/07/2020  . Hypertensive urgency 03/07/2020  . Pain due to onychomycosis of toenails of both feet 08/13/2019  . RBBB 09/02/2014  . Screening, ischemic heart disease 07/11/2014  . Medicare annual wellness visit, subsequent 07/11/2014  . Prostate cancer screening 07/11/2014  . Essential hypertension 01/11/2014  . Hyperlipidemia 01/11/2014    Past Surgical History:  Procedure Laterality Date  . CHOLECYSTECTOMY, LAPAROSCOPIC    . TONSILLECTOMY  1942  . TOTAL HIP ARTHROPLASTY Left 03/08/2020   Procedure: TOTAL HIP ARTHROPLASTY;  Surgeon: Marybelle Killings, MD;  Location: [facility] ORS;  Service: Orthopedics;  Laterality: Left;       Family History  Problem Relation Age of Onset  . Liver disease Mother   . Skin cancer Father   . Skin cancer Paternal Grandfather   . Diabetes Maternal Grandfather     Social History   Tobacco Use  . Smoking status: Former Smoker    Start date: 04/01/1968    Quit date: 01/11/1977    Years since quitting: 43.2  . Smokeless tobacco: Never Used  Vaping Use  . Vaping Use: Never used  Substance Use Topics  . Alcohol use: No  . Drug use: No    Home Medications Prior to Admission medications   Medication Sig Start Date End Date Taking? Authorizing Provider  acetaminophen (TYLENOL) 325 MG tablet Take 2 tablets (650 mg  total) by mouth every 6 (six) hours as needed for mild pain (or Fever >/= 101). 03/13/20   Allie Bossier, MD  alum & mag hydroxide-simeth (MAALOX/MYLANTA) 200-200-20 MG/5ML suspension Take 15 mLs by mouth every 4 (four) hours as needed for indigestion or heartburn. 03/13/20   Allie Bossier, MD  amLODipine (NORVASC) 5 MG tablet Take 1 tablet (5 mg total) by mouth daily. 03/14/20   Allie Bossier, MD  Aromatic Inhalants (VICKS BABYRUB) OINT Apply 1 application topically as needed (skin irritations). Adult strength    [provider]  aspirin 325  MG EC tablet Take 1 tablet (325 mg total) by mouth daily with breakfast. MUST TAKE AT LEAST 4 WEEKS POSTOP FOR DVT PROPHYLAXIS 03/11/20   Lanae Crumbly, PA-C  atorvastatin (LIPITOR) 40 MG tablet Take 1 tablet (40 mg total) by mouth daily. 03/14/20   Allie Bossier, MD  carboxymethylcellulose (REFRESH PLUS) 0.5 % SOLN Place 1 drop into both eyes 3 (three) times daily as needed (dry eyes).    [provider]  docusate sodium (COLACE) 100 MG capsule Take 1 capsule (100 mg total) by mouth 2 (two) times daily. 03/13/20   Allie Bossier, MD  guaiFENesin-dextromethorphan (ROBITUSSIN DM) 100-10 MG/5ML syrup Take 5 mLs by mouth every 4 (four) hours as needed for cough. 03/13/20   Allie Bossier, MD  HYDROcodone-acetaminophen (NORCO) 10-325 MG tablet Take 1 tablet by mouth every 6 (six) hours as needed. 03/10/20   Lanae Crumbly, PA-C  insulin glargine (LANTUS) 100 UNIT/ML Solostar Pen Inject 5 Units into the skin daily. 03/13/20   Allie Bossier, MD  Insulin Pen Needle 29G X 12.7MM MISC Use as directed 03/13/20   Allie Bossier, MD  latanoprost (XALATAN) 0.005 % ophthalmic solution Place 1 drop into both eyes at bedtime.  03/13/19   [provider]  metoCLOPramide (REGLAN) 5 MG tablet Take 1-2 tablets (5-10 mg total) by mouth every 8 (eight) hours as needed for nausea (if ondansetron (ZOFRAN) ineffective.). 03/13/20   Allie Bossier, MD  Polyethyl Glycol-Propyl Glycol (SYSTANE) 0.4-0.3 % GEL ophthalmic gel Place 1 application into both eyes in the morning and at bedtime.    [provider]  senna-docusate (SENOKOT-S) 8.6-50 MG tablet Take 1 tablet by mouth 2 (two) times daily as needed for mild constipation. 03/13/20   Allie Bossier, MD  SIMBRINZA 1-0.2 % SUSP Place 1 drop into both eyes in the morning and at bedtime.  05/16/19   [provider]  tamsulosin (FLOMAX) 0.4 MG CAPS capsule Take 1 capsule (0.4 mg total) by mouth daily after supper. 03/13/20   Allie Bossier, MD    Allergies    Patient has no known allergies.  Review of Systems   Review of Systems  Constitutional: Negative for chills, fatigue and fever.  HENT: Positive for trouble swallowing. Negative for congestion, dental problem, drooling, ear discharge, ear pain, facial swelling, hearing loss, mouth sores, nosebleeds, postnasal drip, rhinorrhea, sinus pressure, sinus pain, sneezing, sore throat, tinnitus and voice change.   Eyes: Negative for pain and visual disturbance.  Respiratory: Negative for cough, shortness of breath and wheezing.   Cardiovascular: Negative for chest pain and palpitations.  Gastrointestinal: Negative for abdominal pain, diarrhea, nausea and vomiting.  Genitourinary: Negative for dysuria and hematuria.  Musculoskeletal: Negative for arthralgias, back pain and myalgias.  Skin: Negative for color change and rash.  Neurological: Negative for seizures, loss of consciousness, syncope and headaches.  All  other systems reviewed and are negative.   Physical Exam Updated Vital Signs  ED Triage Vitals  Enc Vitals Group     BP 03/26/20 0913 138/61     Pulse Rate 03/26/20 0913 91     Resp 03/26/20 0913 17     Temp 03/26/20 0913 98.4 F (36.9 C)     Temp Source 03/26/20 0913 Oral     SpO2 03/26/20 0858 98 %     Weight --      Height --      Head Circumference --      Peak Flow --      Pain Score 03/26/20 0914 0     Pain Loc --      Pain Edu? --      Excl. in [place]? --     Physical Exam Vitals and nursing note reviewed.  Constitutional:      General: He is not in acute distress.    Appearance: He is well-developed and well-nourished. He is not ill-appearing.  HENT:     Head: Normocephalic and atraumatic.     Nose: Nose normal.     Mouth/Throat:     Mouth: Mucous membranes are moist.  Eyes:     Extraocular Movements: Extraocular movements intact.     Conjunctiva/sclera: Conjunctivae normal.     Pupils: Pupils are equal, round, and reactive to light.   Cardiovascular:     Rate and Rhythm: Normal rate and regular rhythm.     Pulses: Normal pulses.     Heart sounds: Normal heart sounds. No murmur heard.   Pulmonary:     Effort: Pulmonary effort is normal. No respiratory distress.     Breath sounds: Normal breath sounds.  Abdominal:     Palpations: Abdomen is soft.     Tenderness: There is no abdominal tenderness.  Musculoskeletal:        General: No edema.     Cervical back: Neck supple.  Skin:    General: Skin is warm and dry.     Capillary Refill: Capillary refill takes less than 2 seconds.  Neurological:     General: No focal deficit present.     Mental Status: He is alert.     Cranial Nerves: No cranial nerve deficit.     Sensory: No sensory deficit.     Motor: No weakness.     Coordination: Coordination normal.  Psychiatric:        Mood and Affect: Mood and affect normal.     ED Results / Procedures / Treatments   Labs (all labs ordered are listed, but only abnormal results are displayed) Labs Reviewed  CBC WITH DIFFERENTIAL/PLATELET - Abnormal; Notable for the following components:      Result Value   WBC 13.3 (*)    RBC 3.42 (*)    Hemoglobin 9.8 (*)    HCT 31.2 (*)    Platelets 415 (*)    Neutro Abs 11.8 (*)    Lymphs Abs 0.4 (*)    Monocytes Absolute 1.1 (*)    Abs Immature Granulocytes 0.10 (*)    All other components within normal limits  BASIC METABOLIC PANEL - Abnormal; Notable for the following components:   Sodium 146 (*)    Chloride 112 (*)    CO2 21 (*)    BUN 33 (*)    Creatinine, Ser 1.90 (*)    Calcium 8.2 (*)    GFR, Estimated 34 (*)    All other components within  normal limits  HEPATIC FUNCTION PANEL - Abnormal; Notable for the following components:   Albumin 2.8 (*)    All other components within normal limits  RESP PANEL BY RT-PCR (FLU A&B, COVID) [id number]    EKG EKG Interpretation  Date/Time:  Sunday March 26 2020 10:51:45 EST Ventricular Rate:  83 PR Interval:    QRS  Duration: 145 QT Interval:  396 QTC Calculation: 466 R Axis:   -80 Text Interpretation: Sinus rhythm RBBB and LAFB Confirmed by Lennice Sites [phone]) on 03/26/2020 11:20:05 AM   Radiology DG Chest Portable 1 View  Result Date: 03/26/2020 CLINICAL DATA:  Choose recent choking episode EXAM: PORTABLE CHEST 1 VIEW COMPARISON:  03/19/2020 FINDINGS: Cardiac shadow is within normal limits. Aortic calcifications are noted. Lungs are well aerated bilaterally. No focal infiltrate or sizable effusion is seen. No bony abnormality is noted. IMPRESSION: No active disease. Electronically Signed   By: Inez Catalina M.D.   On: 03/26/2020 09:50    Procedures Procedures (including critical care time)  Medications Ordered in ED Medications  alum & mag hydroxide-simeth (MAALOX/MYLANTA) 200-200-20 MG/5ML suspension 30 mL (30 mLs Oral Not Given 03/26/20 1049)    And  lidocaine (XYLOCAINE) 2 % viscous mouth solution 15 mL (15 mLs Oral Not Given 03/26/20 1043)  diphenhydrAMINE (BENADRYL) injection 25 mg (has no administration in time range)  pantoprazole (PROTONIX) injection 40 mg (40 mg Intravenous Given 03/26/20 1042)  sodium chloride 0.9 % bolus 1,000 mL (1,000 mLs Intravenous New Bag/Given 03/26/20 1040)    ED Course  I have reviewed the triage vital signs and the nursing notes.  Pertinent labs & imaging results that were available during my care of the patient were reviewed by me and considered in my medical decision making (see chart for details).    MDM Rules/Calculators/A&P                          DURELL LOFASO is an 86 year old male with history of hypertension, high cholesterol who presents the ED with difficulty swallowing.  This appears to be a chronic problem but worse recently.  Patient with normal vitals.  No fever.  Just had extensive work-up at [facility] regional this past several weeks including EGD, speech and swallow eval, CT head and neck.  Overall it was found to be  multifactorial his problems with eating and drinking.  Including mild esophagitis, neck osteophyte and enlarged thyroid causing some issues.  He was seen by neurosurgeon who states that surgery is not an option for the osteophyte.  He is on medications for esophagitis.  He was cleared by speech for dysphagia diet.  However wife and patient state that he has not had much to eat over the last 3 days and is having trouble with liquids as well.  Will check basic labs.  Will give GI cocktail, IV Protonix.  Overall suspect that this is a chronic problem and ongoing.  It sounds like did discuss the possibility of a feeding tube which is likely what he needs if he still having problems.    Lab work is overall unremarkable.  Suspect chronic dysphagia secondary to anatomical issues especially from osteophyte in his neck that causes esophageal compression.  We will have him admitted for continued work-up.  May likely need a feeding tube.  This chart was dictated using voice recognition software.  Despite best efforts to proofread,  errors can occur which can change the documentation meaning.  Final Clinical Impression(s) / ED Diagnoses Final diagnoses:  Dysphagia, unspecified type    Rx / DC Orders ED Discharge Orders    None       Lennice Sites, DO 03/26/20 1216

## 2020-03-26 NOTE — Progress Notes (Signed)
Orthopedic Tech Progress Note Patient Details:  John Walton 08-21-1932 [id number]  Ortho Devices Ortho Device/Splint Location: Applied Bilateral unna boots, Verbal order from ER Nurse. Ortho Device/Splint Interventions: Application   Post Interventions Patient Tolerated: Well Instructions Provided: Care of device   John Walton 03/26/2020, 5:46 PM

## 2020-03-26 NOTE — ED Notes (Signed)
Report given to Susan, RN

## 2020-03-26 NOTE — Progress Notes (Signed)
Orthopedic Tech Progress Note Patient Details:  John Walton [birth year] [id number]  Patient ID: [name], male   DOB: Oct 03, 1932, 86 y.o.   MRN: [id number]   [name] 03/26/2020, 9:10 [facility] ER twice to remind Nurse to put the unna boot order in.

## 2020-03-26 NOTE — H&P (Signed)
History and Physical    John Walton OAC:[id number] DOB: 1933-01-08 DOA: 03/26/2020  PCP: [name], MD  Patient coming from: [facility] SN[facility]  Chief Complaint: Fever, difficulty swallowing.  HPI: John Walton is a 86 y.o. male with medical history significant of HTN, HLD. Presenting with fevers and difficulty swallowing. Family reports that patient is current at a SNF for rehab on his hip. They note that he was having some difficulty swallowing which required a hospital visit to an outside hospital. He had a full GI and swallowing w/u including EGD and SLP evals. He also had a CT H&N that showed mass effect on the esophagus from a cervical osteophyte and an enlarged thyroid that has some compression of the esophagus. He was evaluated by a neurosurgery who informed him that the osteophyte is non-surgical. He was cleared for a limited diet by the SLP and sent back to his SNF. He has had difficulty swallowing liquids and solids over the past several days. He has also had intermittent fevers over the same time. His SNF was concerned he may be developing a PNA and sent him to the ED for evaluation.   ED Course: CXR was negative for PNA. Lab work was fairly stable. He was COVID negative. TRh was asked for admission for dysphagia w/u and possible PEG placement.   Review of Systems:  Denies CP, dyspnea, palpitations, N/V/D. Reports intermittent fever. Review of systems is otherwise negative for all not mentioned in HPI.   PMHx Past Medical History:  Diagnosis Date  . Chicken pox   . Frequent headaches   . Glaucoma   . Hay fever   . History of blood transfusion   . HTN (hypertension)   . Hyperlipidemia   . Hyperlipidemia   . Migraines   . 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[facility])     PSHx Past Surgical History:  Procedure Laterality Date  . CHOLECYSTECTOMY, LAPAROSCOPIC    . TONSILLECTOMY  1942  . TOTAL HIP ARTHROPLASTY Left 03/08/2020   Procedure: TOTAL HIP ARTHROPLASTY;   Surgeon: Marybelle Killings, MD;  Location: [facility] OR[facility];  Service: Orthopedics;  Laterality: Left;    SocHx  reports that he quit smoking about 43 years ago. He started smoking about 52 years ago. He has never used smokeless tobacco. He reports that he does not drink alcohol and does not use drugs.  No Known Allergies  FamHx Family History  Problem Relation Age of Onset  . Liver disease Mother   . Skin cancer Father   . Skin cancer Paternal Grandfather   . Diabetes Maternal Grandfather     Prior to Admission medications   Medication Sig Start Date End Date Taking? Authorizing Provider  acetaminophen (TYLENOL) 325 MG tablet Take 2 tablets (650 mg total) by mouth every 6 (six) hours as needed for mild pain (or Fever >/= 101). 03/13/20   Allie Bossier, MD  alum & mag hydroxide-simeth (MAALOX/MYLANTA) 200-200-20 MG/5ML suspension Take 15 mLs by mouth every 4 (four) hours as needed for indigestion or heartburn. 03/13/20   Allie Bossier, MD  amLODipine (NORVASC) 5 MG tablet Take 1 tablet (5 mg total) by mouth daily. 03/14/20   Allie Bossier, MD  Aromatic Inhalants (VICKS BABYRUB) OINT Apply 1 application topically as needed (skin irritations). Adult strength    [provider]  aspirin 325 MG EC tablet Take 1 tablet (325 mg total) by mouth daily with breakfast. MUST TAKE AT LEAST 4 WEEKS POSTOP FOR DVT PROPHYLAXIS  03/11/20   Lanae Crumbly, PA-C  atorvastatin (LIPITOR) 40 MG tablet Take 1 tablet (40 mg total) by mouth daily. 03/14/20   Allie Bossier, MD  carboxymethylcellulose (REFRESH PLUS) 0.5 % SOLN Place 1 drop into both eyes 3 (three) times daily as needed (dry eyes).    [provider]  docusate sodium (COLACE) 100 MG capsule Take 1 capsule (100 mg total) by mouth 2 (two) times daily. 03/13/20   Allie Bossier, MD  guaiFENesin-dextromethorphan (ROBITUSSIN DM) 100-10 MG/5ML syrup Take 5 mLs by mouth every 4 (four) hours as needed for cough. 03/13/20   Allie Bossier, MD   HYDROcodone-acetaminophen (NORCO) 10-325 MG tablet Take 1 tablet by mouth every 6 (six) hours as needed. 03/10/20   Lanae Crumbly, PA-C  insulin glargine (LANTUS) 100 UNIT/ML Solostar Pen Inject 5 Units into the skin daily. 03/13/20   Allie Bossier, MD  Insulin Pen Needle 29G X 12.7MM MISC Use as directed 03/13/20   Allie Bossier, MD  latanoprost (XALATAN) 0.005 % ophthalmic solution Place 1 drop into both eyes at bedtime.  03/13/19   [provider]  metoCLOPramide (REGLAN) 5 MG tablet Take 1-2 tablets (5-10 mg total) by mouth every 8 (eight) hours as needed for nausea (if ondansetron (ZOFRAN) ineffective.). 03/13/20   Allie Bossier, MD  Polyethyl Glycol-Propyl Glycol (SYSTANE) 0.4-0.3 % GEL ophthalmic gel Place 1 application into both eyes in the morning and at bedtime.    [provider]  senna-docusate (SENOKOT-S) 8.6-50 MG tablet Take 1 tablet by mouth 2 (two) times daily as needed for mild constipation. 03/13/20   Allie Bossier, MD  SIMBRINZA 1-0.2 % SUSP Place 1 drop into both eyes in the morning and at bedtime.  05/16/19   [provider]  tamsulosin (FLOMAX) 0.4 MG CAPS capsule Take 1 capsule (0.4 mg total) by mouth daily after supper. 03/13/20   Allie Bossier, MD    Physical Exam: Vitals:   03/26/20 0858 03/26/20 0913  BP:  138/61  Pulse:  91  Resp:  17  Temp:  98.4 F (36.9 C)  TempSrc:  Oral  SpO2: 98% 97%    General: 86 y.o. male resting in bed in NAD Eyes: PERRL, normal sclera ENMT: Nares patent w/o discharge, orophaynx clear, dentition normal, ears w/o discharge/lesions/ulcers Neck: Supple, trachea midline Cardiovascular: RRR, +S1, S2, no m/g/r, equal pulses throughout Respiratory: CTABL, no w/r/r, normal WOB GI: BS+, NDNT, no masses noted, no organomegaly noted MSK: No e/c/c Skin: No rashes, bruises, ulcerations noted Neuro: A&O x 3, no focal deficits Psyc: Appropriate interaction and affect, calm/cooperative  Labs on  Admission: I have personally reviewed following labs and imaging studies  CBC: Recent Labs  Lab 03/26/20 1049  WBC 13.3*  NEUTROABS 11.8*  HGB 9.8*  HCT 31.2*  MCV 91.2  PLT 341*   Basic Metabolic Panel: Recent Labs  Lab 03/26/20 1049  NA 146*  K 3.8  CL 112*  CO2 21*  GLUCOSE 86  BUN 33*  CREATININE 1.90*  CALCIUM 8.2*   GFR: CrCl cannot be calculated (Unknown ideal weight.). Liver Function Tests: Recent Labs  Lab 03/26/20 1049  AST 28  ALT 31  ALKPHOS 83  BILITOT 0.9  PROT 6.7  ALBUMIN 2.8*   No results for input(s): LIPASE, AMYLASE in the last 168 hours. No results for input(s): AMMONIA in the last 168 hours. Coagulation Profile: No results for input(s): INR, PROTIME in the last 168 hours. Cardiac Enzymes:  No results for input(s): CKTOTAL, CKMB, CKMBINDEX, TROPONINI in the last 168 hours. BNP (last 3 results) No results for input(s): PROBNP in the last 8760 hours. HbA1C: No results for input(s): HGBA1C in the last 72 hours. CBG: No results for input(s): GLUCAP in the last 168 hours. Lipid Profile: No results for input(s): CHOL, HDL, LDLCALC, TRIG, CHOLHDL, LDLDIRECT in the last 72 hours. Thyroid Function Tests: No results for input(s): TSH, T4TOTAL, FREET4, T3FREE, THYROIDAB in the last 72 hours. Anemia Panel: No results for input(s): VITAMINB12, FOLATE, FERRITIN, TIBC, IRON, RETICCTPCT in the last 72 hours. Urine analysis:    Component Value Date/Time   COLORURINE YELLOW 03/07/2020 1250   APPEARANCEUR CLEAR 03/07/2020 1250   LABSPEC 1.012 03/07/2020 1250   PHURINE 5.0 03/07/2020 1250   GLUCOSEU NEGATIVE 03/07/2020 1250   GLUCOSEU 500 (A) 07/11/2014 0850   HGBUR SMALL (A) 03/07/2020 1250   BILIRUBINUR NEGATIVE 03/07/2020 1250   KETONESUR NEGATIVE 03/07/2020 1250   PROTEINUR 100 (A) 03/07/2020 1250   UROBILINOGEN 0.2 07/11/2014 0850   NITRITE NEGATIVE 03/07/2020 1250   LEUKOCYTESUR NEGATIVE 03/07/2020 1250    Radiological Exams on  Admission: DG Chest Portable 1 View  Result Date: 03/26/2020 CLINICAL DATA:  Choose recent choking episode EXAM: PORTABLE CHEST 1 VIEW COMPARISON:  03/19/2020 FINDINGS: Cardiac shadow is within normal limits. Aortic calcifications are noted. Lungs are well aerated bilaterally. No focal infiltrate or sizable effusion is seen. No bony abnormality is noted. IMPRESSION: No active disease. Electronically Signed   By: Inez Catalina M.D.   On: 03/26/2020 09:50    EKG: Independently reviewed. Sinus, no st elevations  Assessment/Plan Dysphagia Poor PO intake     - admit to inpatient, med surg     - consult SLP for swallow study     - will likely need PEG; already d/w family, they would be amenable to placement of PEG     - NPO  Fevers     - likely from aspiration     - White count mildly elevated, he has no fevers here, no O2 requirement     - check UA and blood Cx, hold abx until fever seen  HLD     - resume statin when he takes PO  DM2     - A1c, glucose checks  BPH     - resume home meds when taking PO  CKD 3b     - watch nephrotoxins     - he is at baseline  Normocytic anemia     - check iron studies  DVT prophylaxis: heparin  Code Status: FULL  Family Communication: With wife at bedside  Consults called: None   Status is: Inpatient  Remains inpatient appropriate because:IV treatments appropriate due to intensity of illness or inability to take PO   Dispo: The patient is from: SNF              Anticipated d/c is to: SNF              Anticipated d/c date is: 2 days              Patient currently is not medically stable to d/c.  Jonnie Finner DO [facility]  If 7PM-7AM, please contact night-coverage [email or web address]  03/26/2020, 12:04 PM

## 2020-03-26 NOTE — ED Triage Notes (Addendum)
[facility] transferred pt from [facility] to [facility] ED and reports the following:  Pt was seen at [facility] 12/23 for elevated temperature. They gave him Robitussin and Tylenol (suppository). Per his spouse, he is allergic to Robitussin. He explains this is the reason his esophagus is swollen causing difficult swallowing. He was tx at [facility] with benadryl for possible allergic reaction and discharge summary indicates no change in status. [facility] sent pt to ED because he has not eaten since 12/23, the day he was admitted to [facility]. [facility]charge [facility] from [facility] at bedside. Confused at baseline. Pt tested negative for COVID at [facility] this morning.

## 2020-03-27 ENCOUNTER — Telehealth: Payer: Self-pay | Admitting: Orthopaedic Surgery

## 2020-03-27 DIAGNOSIS — R1310 Dysphagia, unspecified: Secondary | ICD-10-CM | POA: Diagnosis not present

## 2020-03-27 LAB — COMPREHENSIVE METABOLIC PANEL
ALT: 21 U/L (ref 0–44)
AST: 17 U/L (ref 15–41)
Albumin: 2.5 g/dL — ABNORMAL LOW (ref 3.5–5.0)
Alkaline Phosphatase: 68 U/L (ref 38–126)
Anion gap: 12 (ref 5–15)
BUN: 27 mg/dL — ABNORMAL HIGH (ref 8–23)
CO2: 20 mmol/L — ABNORMAL LOW (ref 22–32)
Calcium: 7.7 mg/dL — ABNORMAL LOW (ref 8.9–10.3)
Chloride: 117 mmol/L — ABNORMAL HIGH (ref 98–111)
Creatinine, Ser: 1.66 mg/dL — ABNORMAL HIGH (ref 0.61–1.24)
GFR, Estimated: 40 mL/min — ABNORMAL LOW (ref >60)
Glucose, Bld: 78 mg/dL (ref 70–99)
Potassium: 3.5 mmol/L (ref 3.5–5.1)
Sodium: 149 mmol/L — ABNORMAL HIGH (ref 135–145)
Total Bilirubin: 0.7 mg/dL (ref 0.3–1.2)
Total Protein: 5.8 g/dL — ABNORMAL LOW (ref 6.5–8.1)

## 2020-03-27 LAB — GLUCOSE, CAPILLARY
Glucose-Capillary: 102 mg/dL — ABNORMAL HIGH (ref 70–99)
Glucose-Capillary: 65 mg/dL — ABNORMAL LOW (ref 70–99)
Glucose-Capillary: 66 mg/dL — ABNORMAL LOW (ref 70–99)
Glucose-Capillary: 70 mg/dL (ref 70–99)
Glucose-Capillary: 70 mg/dL (ref 70–99)
Glucose-Capillary: 72 mg/dL (ref 70–99)

## 2020-03-27 LAB — CBC
HCT: 27 % — ABNORMAL LOW (ref 39.0–52.0)
Hemoglobin: 8.3 g/dL — ABNORMAL LOW (ref 13.0–17.0)
MCH: 28.8 pg (ref 26.0–34.0)
MCHC: 30.7 g/dL (ref 30.0–36.0)
MCV: 93.8 fL (ref 80.0–100.0)
Platelets: 307 10*3/uL (ref 150–400)
RBC: 2.88 MIL/uL — ABNORMAL LOW (ref 4.22–5.81)
RDW: 13.6 % (ref 11.5–15.5)
WBC: 8.5 10*3/uL (ref 4.0–10.5)
nRBC: 0 % (ref 0.0–0.2)

## 2020-03-27 MED ORDER — SODIUM CHLORIDE 0.9 % IV SOLN
510.0000 mg | Freq: Once | INTRAVENOUS | Status: AC
  Administered 2020-03-27: 510 mg via INTRAVENOUS
  Filled 2020-03-27: qty 510

## 2020-03-27 MED ORDER — CHLORHEXIDINE GLUCONATE CLOTH 2 % EX PADS
6.0000 | MEDICATED_PAD | Freq: Every day | CUTANEOUS | Status: DC
  Administered 2020-03-30 – 2020-04-03 (×5): 6 via TOPICAL

## 2020-03-27 MED ORDER — SODIUM CHLORIDE 0.45 % IV SOLN: INTRAVENOUS | Status: DC

## 2020-03-27 MED ORDER — DIPHENHYDRAMINE HCL 50 MG/ML IJ SOLN
25.0000 mg | Freq: Once | INTRAMUSCULAR | Status: AC
  Administered 2020-03-27: 25 mg via INTRAVENOUS
  Filled 2020-03-27: qty 1

## 2020-03-27 MED ORDER — INSULIN ASPART 100 UNIT/ML ~~LOC~~ SOLN
0.0000 [IU] | Freq: Every day | SUBCUTANEOUS | Status: DC
  Administered 2020-04-01 – 2020-04-03 (×4): 2 [IU] via SUBCUTANEOUS

## 2020-03-27 MED ORDER — PANTOPRAZOLE SODIUM 40 MG IV SOLR
40.0000 mg | INTRAVENOUS | Status: DC
  Administered 2020-03-27 – 2020-03-28 (×2): 40 mg via INTRAVENOUS
  Filled 2020-03-27 (×2): qty 40

## 2020-03-27 MED ORDER — DEXTROSE 50 % IV SOLN
12.5000 g | INTRAVENOUS | Status: AC
  Administered 2020-03-27: 12.5 g via INTRAVENOUS
  Filled 2020-03-27: qty 50

## 2020-03-27 MED ORDER — INSULIN ASPART 100 UNIT/ML ~~LOC~~ SOLN
0.0000 [IU] | Freq: Three times a day (TID) | SUBCUTANEOUS | Status: DC
  Administered 2020-03-28: 1 [IU] via SUBCUTANEOUS
  Administered 2020-03-29 (×2): 2 [IU] via SUBCUTANEOUS
  Administered 2020-03-30: 3 [IU] via SUBCUTANEOUS
  Administered 2020-03-30: 2 [IU] via SUBCUTANEOUS
  Administered 2020-03-31 – 2020-04-01 (×4): 3 [IU] via SUBCUTANEOUS
  Administered 2020-04-01: 5 [IU] via SUBCUTANEOUS
  Administered 2020-04-01: 3 [IU] via SUBCUTANEOUS
  Administered 2020-04-02: 5 [IU] via SUBCUTANEOUS
  Administered 2020-04-02: 9 [IU] via SUBCUTANEOUS
  Administered 2020-04-02: 3 [IU] via SUBCUTANEOUS
  Administered 2020-04-03: 7 [IU] via SUBCUTANEOUS
  Administered 2020-04-03: 3 [IU] via SUBCUTANEOUS
  Administered 2020-04-03: 7 [IU] via SUBCUTANEOUS
  Administered 2020-04-04: 1 [IU] via SUBCUTANEOUS

## 2020-03-27 NOTE — Progress Notes (Signed)
Subjective: States left hip is doing very well. No pain.     Objective: Vital signs in last 24 hours: Temp:  [97.6 F (36.4 C)-98.6 F (37 C)] 98.4 F (36.9 C) (12/27 1317) Pulse Rate:  [73-94] 79 (12/27 1317) Resp:  [16-21] 18 (12/27 1317) BP: (143-161)/(55-69) 143/61 (12/27 1317) SpO2:  [94 %-98 %] 98 % (12/27 1317) Weight:  [61.2 kg] 61.2 kg (12/26 1727)  Intake/Output from previous day: 12/26 0701 - 12/27 0700 In: 694.3 [I.V.:694.3] Out: 900 [Urine:900] Intake/Output this shift: Total I/O In: 0  Out: 500 [Urine:500]  Recent Labs    03/26/20 1049 03/26/20 1833 03/27/20 0415  HGB 9.8* 9.2* 8.3*   Recent Labs    03/26/20 1833 03/27/20 0415  WBC 9.5 8.5  RBC 3.23* 2.88*  HCT 30.4* 27.0*  PLT 383 307   Recent Labs    03/26/20 1049 03/26/20 1833 03/27/20 0415  NA 146*  --  149*  K 3.8  --  3.5  CL 112*  --  117*  CO2 21*  --  20*  BUN 33*  --  27*  CREATININE 1.90* 1.70* 1.66*  GLUCOSE 86  --  78  CALCIUM 8.2*  --  7.7*   No results for input(s): LABPT, INR in the last 72 hours.  Exam: Left hip wound healed nicely.  No drainage or signs of infection.  Staples intact. bilat calves nontender. NVI.     Assessment/Plan: RN can remove staples.  Continue present care.  Hip doing very well. Can get up with PT when medically stable.  Patient states that he is weak.       John Walton 03/27/2020, 3:52 PM

## 2020-03-27 NOTE — Progress Notes (Signed)
Hypoglycemic Event  CBG: 65  Treatment: D50 55mL (12.5gm)  Symptoms: none  Follow-up CBG: Time: 1740 CBG Result:102  Possible Reasons for Event: Inadequate Intake  Comments/MD notified:Protocol initiated     Angelynn Lemus  Tamala Julian

## 2020-03-27 NOTE — Evaluation (Signed)
Physical Therapy Evaluation Patient Details Name: John Walton MRN: [id number] DOB: 05-18-1932 Today's Date: 03/27/2020   History of Present Illness  86 year old male with history of hypertension, hyperlipidemia, dysphagia who presented from [facility] skilled nursing facility for the evaluation of dysphagia, fever.  Clinical Impression  Pt admitted with above diagnosis.  Pt agreeable to EOB today, fatigues easily however requiring only min assist for bed mobility. Pt frustrated regarding swallowing issues. Will continue to follow in acute setting.  Pt currently with functional limitations due to the deficits listed below (see PT Problem List). Pt will benefit from skilled PT to increase their independence and safety with mobility to allow discharge to the venue listed below.       Follow Up Recommendations SNF    Equipment Recommendations  None recommended by PT    Recommendations for Other Services       Precautions / Restrictions Precautions Precautions: Fall;Posterior Hip Precaution Comments: pt does not recall THP Restrictions Other Position/Activity Restrictions: WBAT      Mobility  Bed Mobility   Bed Mobility: Supine to Sit;Sit to Supine     Supine to sit: Min assist;HOB elevated Sit to supine: Min assist   General bed mobility comments: cues for sequence, posterior THP, assist to fully elevate trunk, supervision to supine, incr time. pt was able to scoot laterally along EOB but fatigued quickly, able to assist self with scooting up in supine    Transfers Overall transfer level: Needs assistance Equipment used: Rolling walker (2 wheeled)             General transfer comment: pt declined, OOB today  Ambulation/Gait                Stairs            Wheelchair Mobility    Modified Rankin (Stroke Patients Only)       Balance   Sitting-balance support: Feet supported;No upper extremity supported Sitting balance-Leahy Scale: Fair                                        Pertinent Vitals/Pain Pain Assessment: No/denies pain    Home Living Family/patient expects to be discharged to:: Unsure Living Arrangements: Spouse/significant other               Additional Comments: pt states that he does not know where is going from the hospital, SNF vs Home    Prior Function Level of Independence: Independent         Comments: IND prior to hip fx. pt states he has been doing some exercises but very little amb at SNF     Hand Dominance        Extremity/Trunk Assessment   Upper Extremity Assessment Upper Extremity Assessment: Generalized weakness    Lower Extremity Assessment Lower Extremity Assessment: Generalized weakness       Communication   Communication: No difficulties  Cognition Arousal/Alertness: Awake/alert Behavior During Therapy: WFL for tasks assessed/performed                                   General Comments: "how long jhave I been here?" initially unsure of location, oriented to self, time, unsure of date; after oriented pt he was able to give timeline of events, state reason for being at [facility]  General Comments      Exercises     Assessment/Plan    PT Assessment Patient needs continued PT services  PT Problem List Decreased strength;Decreased range of motion;Decreased activity tolerance;Decreased balance;Decreased mobility;Decreased knowledge of use of DME;Decreased knowledge of precautions       PT Treatment Interventions DME instruction;Gait training;Functional mobility training;Therapeutic activities;Therapeutic exercise;Patient/family education;Balance training    PT Goals (Current goals can be found in the Care Plan section)  Acute Rehab PT Goals Patient Stated Goal: Regain IND, be able to swallow PT Goal Formulation: With patient Time For Goal Achievement: 04/10/20 Potential to Achieve Goals: Good    Frequency Min 3X/week   Barriers  to discharge        Co-evaluation               AM-PAC PT "6 Clicks" Mobility  Outcome Measure Help needed turning from your back to your side while in a flat bed without using bedrails?: A Little Help needed moving from lying on your back to sitting on the side of a flat bed without using bedrails?: A Little Help needed moving to and from a bed to a chair (including a wheelchair)?: A Lot Help needed standing up from a chair using your arms (e.g., wheelchair or bedside chair)?: A Lot Help needed to walk in hospital room?: A Lot Help needed climbing 3-5 steps with a railing? : A Lot 6 Click Score: 14    End of Session   Activity Tolerance: Patient limited by fatigue Patient left: with call bell/phone within reach;in bed;with bed alarm set Nurse Communication: Mobility status PT Visit Diagnosis: Difficulty in walking, not elsewhere classified (R26.2);History of falling (Z91.81);Unsteadiness on feet (R26.81)    Time: 4536-4680 PT Time Calculation (min) (ACUTE ONLY): 31 min   Charges:   PT Evaluation $PT Eval Low Complexity: 1 Low PT Treatments $Gait Training: 8-22 mins $Therapeutic Activity: 8-22 mins        Baxter Flattery, PT  Acute Rehab Dept ([facility]) [phone] Pager [phone]  03/27/2020   [facility] 03/27/2020, 4:18 PM

## 2020-03-27 NOTE — Progress Notes (Addendum)
PROGRESS NOTE    John Walton  TOI:[id number] DOB: 10/01/1932 DOA: 03/26/2020 PCP: [name], MD   Chief Complain: Fever, dysphagia  Brief Narrative: Patient is a 86 year old male with history of hypertension, hyperlipidemia, dysphagia who presented from [place] facility for the evaluation of dysphagia, fever.  He recently had hip replacement and is currently residing in a skilled nursing facility.  He is mostly bedbound since last few weeks.  Most of the information was taken from his wife.  As per the wife, he had swallowing issues about 10 years ago which automatically resolved after he was giving some antiallergic medication/Benadryl.  He had this issue again few weeks ago and he was worked up extensively at [facility].  He underwent EGD, swallowing evaluation; the studies were largely unremarkable.  He was then sent back to skilled nursing facility. We have requested speech therapy evaluation here.  Patient and family are interested on PEG placement if he is found to have persistent dysphagia.  Assessment & Plan:   Active Problems:   Dysphagia   Dysphagia: Intermittent dysphagia since last 10 years.  Unknown etiology.  He was recently [facility] and had extensive work-up: Speech evaluation, EGD.  Work-up was largely negative except for mild esophagitis,gastritis,duodenitis.Continue protonix. We have requested for speech therapy evaluation.  Patient and family are interested on PEG placement if he has persistent dysphagia.  Chronic indwelling Foley catheter: He was discharged to rehab with Foley catheter.  He needs to follow-up with urology as an outpatient.  He has been recommended to follow-up with urology at [facility].  We will continue Foley catheter for now.  DISH: CT head neck was ordered to evaluate for other potential causes. CT head and neck demonstrated DISH with large osteophyte at C5-C6 causing  mass-effect on the esophagus that was also seen during original PFS as well as enlarged thyroid causing some compression. Spinal surgery was consulted who stated that patient's dysphagia was most likely multifactorial including the apprehension causes and from kyphosis. There was no safe way further removal of the osteophyte ,so nonsurgical management was recommended.  Report of fever: Currently afebrile.  Aspiration pneumonia suspected but chest x-ray did not show any pneumonia.  Currently on room air.  Lungs are clear.  Hypernatremia: Could be from dehydration.  Started on 0.45%  saline.  Will check his BMP tomorrow.  Moderate protein calorie malnutirtion: Albumin in the range of 2.  Will request dietary consultation  Hyperlipidemia: On statin at home  Diabetes type 2: Monitor blood sugars.  CKD stage 4: He was evaluated by nephrology at [facility].  Currently kidney function at baseline.  We recommend to follow-up with Dr. 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 oncology follow-up was recommended.  Normocytic anemia/iron deficiency: Iron studies showed low iron.  Given a dose of IV iron.  Needs oral supplementation on discharge.  Debility/deconditioning: He was recently hospitalized here after mechanical fall with hip fracture requiring surgery on 12/8 and was discharged to skilled nursing facility.  Will get PT/OT involved.                DVT prophylaxis:Heprain [place] Code Status: Full Family Communication: wife at bedside Status is: Inpatient  Remains inpatient appropriate because:Inpatient level of  care appropriate due to severity of illness   Dispo: The patient is from: SNF              Anticipated d/c is to: SNF              Anticipated d/c date is: 3 days               Patient currently is not medically stable to d/c.     Consultants: None  Procedures:None  Antimicrobials:  Anti-infectives (From admission, onward)   None      Subjective: Patient seen and examined at bedside this morning.  Hemodynamically stable.  Comfortable during my evaluation.  Not in any kind of distress.  Wife was concerned about his dysphagia.  Monitor blood sugars Vitals:   03/26/20 1727 03/26/20 1753 03/26/20 2120 03/27/20 0505  BP:  (!) 161/59 (!) 155/62 (!) 159/69  Pulse:  73 81 94  Resp:  17 (!) 21 16  Temp:  97.6 F (36.4 C) 98.1 F (36.7 C) 98.6 F (37 C)  TempSrc:  Oral Oral Oral  SpO2:  98% 95% 97%  Weight: 61.2 kg     Height: 5\' 6"  (1.676 m)       Intake/Output Summary (Last 24 hours) at 03/27/2020 1146 Last data filed at 03/27/2020 1019 Gross per 24 hour  Intake 694.28 ml  Output 900 ml  Net -205.72 ml   Filed Weights   03/26/20 1727  Weight: 61.2 kg    Examination:  General exam: Deconditioned, debilitated,chronically ill looking Respiratory system: Bilateral equal air entry, normal vesicular breath sounds, no wheezes or crackles  Cardiovascular system: S1 & S2 heard, RRR. No JVD, murmurs, rubs, gallops or clicks. No pedal edema. Gastrointestinal system: Abdomen is nondistended, soft and nontender. No organomegaly or masses felt. Normal bowel sounds heard. Central nervous system: Alert and oriented. No focal neurological deficits. Extremities: No edema, no clubbing ,no cyanosis Skin: right lateral heel pressure injury UJ:[id number]    Data Reviewed: I have personally reviewed following labs and imaging studies  CBC: Recent Labs  Lab 03/26/20 1049 03/26/20 1833 03/27/20 0415  WBC 13.3* 9.5 8.5  NEUTROABS 11.8*  --   --   HGB 9.8* 9.2* 8.3*  HCT 31.2* 30.4* 27.0*  MCV 91.2 94.1 93.8  PLT 415* 383 478   Basic Metabolic Panel: Recent Labs  Lab 03/26/20 1049 03/26/20 1833 03/27/20 0415  NA 146*  --  149*  K 3.8  --   3.5  CL 112*  --  117*  CO2 21*  --  20*  GLUCOSE 86  --  78  BUN 33*  --  27*  CREATININE 1.90* 1.70* 1.66*  CALCIUM 8.2*  --  7.7*   GFR: Estimated Creatinine Clearance: 28.2 mL/min (A) (by C-G formula based on SCr of 1.66 mg/dL (H)). Liver Function Tests: Recent Labs  Lab 03/26/20 1049 03/27/20 0415  AST 28 17  ALT 31 21  ALKPHOS 83 68  BILITOT 0.9 0.7  PROT 6.7 5.8*  ALBUMIN 2.8* 2.5*   No results for input(s): LIPASE, AMYLASE in the last 168 hours. No results for input(s): AMMONIA in the last 168 hours. Coagulation Profile: No results for input(s): INR, PROTIME in the last 168 hours. Cardiac Enzymes: No results for input(s): CKTOTAL, CKMB, CKMBINDEX, TROPONINI in the last 168 hours. BNP (last 3 results) No results for input(s): PROBNP in the last 8760 hours. HbA1C: No results for input(s): HGBA1C in the last 72 hours. CBG: Recent Labs  Lab 03/27/20 0727  GLUCAP 70   Lipid Profile: No results for input(s): CHOL, HDL, LDLCALC, TRIG, CHOLHDL, LDLDIRECT in the last 72 hours. Thyroid Function Tests: No results for input(s): TSH, T4TOTAL, FREET4, T3FREE, THYROIDAB in the last 72 hours. Anemia Panel: Recent Labs    03/26/20 1833  FERRITIN 112  TIBC 195*  IRON 18*   Sepsis Labs: No results for input(s): PROCALCITON, LATICACIDVEN in the last 168 hours.  Recent Results (from the past 240 hour(s))  Resp Panel by RT-PCR (Flu A&B, Covid) Nasopharyngeal Swab     Status: None   Collection Time: 03/26/20 10:49 AM   Specimen: Nasopharyngeal Swab; Nasopharyngeal(NP) swabs in vial transport medium  Result Value Ref Range Status   SARS Coronavirus 2 by RT PCR NEGATIVE NEGATIVE Final    Comment: (NOTE) SARS-CoV-2 target nucleic acids are NOT DETECTED.  The SARS-CoV-2 RNA is generally detectable in upper respiratory specimens during the acute phase of infection. The lowest concentration of SARS-CoV-2 viral copies this assay can detect is 138 copies/mL. A negative  result does not preclude SARS-Cov-2 infection and should not be used as the sole basis for treatment or other patient management decisions. A negative result may occur with  improper specimen collection/handling, submission of specimen other than nasopharyngeal swab, presence of viral mutation(s) within the areas targeted by this assay, and inadequate number of viral copies(<138 copies/mL). A negative result must be combined with clinical observations, patient history, and epidemiological information. The expected result is Negative.  Fact Sheet for Patients:  EntrepreneurPulse.com.au  Fact Sheet for Healthcare Providers:  IncredibleEmployment.be  This test is no t yet approved or cleared by the [place] FDA and  has been authorized for detection and/or diagnosis of SARS-CoV-2 by FDA under an Emergency Use Authorization (EUA). This EUA will remain  in effect (meaning this test can be used) for the duration of the COVID-19 declaration under Section 564(b)(1) of the Act, 21 U.S.C.section 360bbb-3(b)(1), unless the authorization is terminated  or revoked sooner.       Influenza A by PCR NEGATIVE NEGATIVE Final   Influenza B by PCR NEGATIVE NEGATIVE Final    Comment: (NOTE) The Xpert Xpress SARS-CoV-2/FLU/RSV plus assay is intended as an aid in the diagnosis of influenza from Nasopharyngeal swab specimens and should not be used as a sole basis for treatment. Nasal washings and aspirates are unacceptable for Xpert Xpress SARS-CoV-2/FLU/RSV testing.  Fact Sheet for Patients: EntrepreneurPulse.com.au  Fact Sheet for Healthcare Providers: IncredibleEmployment.be  This test is not yet approved or cleared by the [place] FDA and has been authorized for detection and/or diagnosis of SARS-CoV-2 by FDA under an Emergency Use Authorization (EUA). This EUA will remain in effect (meaning this test can be used)  for the duration of the COVID-19 declaration under Section 564(b)(1) of the Act, 21 U.S.C. section 360bbb-3(b)(1), unless the authorization is terminated or revoked.  Performed at [facility], [place] [address] [place], [place] [address]   Culture, blood (routine x 2)     Status: None (Preliminary result)   Collection Time: 03/26/20  6:33 PM   Specimen: BLOOD  Result Value Ref Range Status   Specimen Description   Final    BLOOD LEFT HAND Performed at [place] [address], [place], [place] [address]    Special Requests   Final    BOTTLES DRAWN AEROBIC ONLY Blood Culture adequate volume Performed at [facility] [address], [place], [place] [address]  Culture   Final    NO GROWTH < 12 HOURS Performed at [facility], [place] [address], [place], [place] [address]    Report Status PENDING  Incomplete  Culture, blood (routine x 2)     Status: None (Preliminary result)   Collection Time: 03/26/20  6:33 PM   Specimen: BLOOD  Result Value Ref Range Status   Specimen Description   Final    BLOOD LEFT ANTECUBITAL Performed at [facility] [address] [place], [place] [address]    Special Requests   Final    BOTTLES DRAWN AEROBIC ONLY Blood Culture adequate volume Performed at [facility] [address], [place], [place] [address]    Culture   Final    NO GROWTH < 12 HOURS Performed at [facility] [address] [place], [place] [address]    Report Status PENDING  Incomplete         Radiology Studies: DG Chest Portable 1 View  Result Date: 03/26/2020 CLINICAL DATA:  Choose recent choking episode EXAM: PORTABLE CHEST 1 VIEW COMPARISON:  03/19/2020 FINDINGS: Cardiac shadow is within normal limits. Aortic calcifications are noted. Lungs are well aerated bilaterally. No focal infiltrate or sizable effusion is seen. No bony abnormality is noted.  IMPRESSION: No active disease. Electronically Signed   By: Inez Catalina M.D.   On: 03/26/2020 09:50        Scheduled Meds: . alum & mag hydroxide-simeth  30 mL Oral Once   And  . lidocaine  15 mL Oral Once  . Chlorhexidine Gluconate Cloth  6 each Topical Daily  . heparin  5,000 Units Subcutaneous Q8H  . latanoprost  1 drop Both Eyes QHS  . polyvinyl alcohol  1 drop Both Eyes TID   Continuous Infusions: . sodium chloride 75 mL/hr at 03/27/20 0946     LOS: 1 day    Time spent: 35 mins.More than 50% of that time was spent in counseling and/or coordination of care.      Shelly Coss, MD [facility] P12/27/2021, 11:46 AM

## 2020-03-27 NOTE — Evaluation (Signed)
Clinical/Bedside Swallow Evaluation Patient Details  Name: John Walton MRN: [id number] Date of Birth: February 08, 1933  Today's Date: 03/27/2020 Time: SLP Start Time (ACUTE ONLY): 0712 SLP Stop Time (ACUTE ONLY): 1251 SLP Time Calculation (min) (ACUTE ONLY): 80 min  Past Medical History:  Past Medical History:  Diagnosis Date  . Chicken pox   . Frequent headaches   . Glaucoma   . Hay fever   . History of blood transfusion   . HTN (hypertension)   . Hyperlipidemia   . Hyperlipidemia   . Migraines   . 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[facility])    Past Surgical History:  Past Surgical History:  Procedure Laterality Date  . CHOLECYSTECTOMY, LAPAROSCOPIC    . TONSILLECTOMY  1942  . TOTAL HIP ARTHROPLASTY Left 03/08/2020   Procedure: TOTAL HIP ARTHROPLASTY;  Surgeon: Marybelle Killings, MD;  Location: [facility]S;  Service: Orthopedics;  Laterality: Left;   HPI:  86 yo male with complex med hx including dysphagia due to DISH, esophagitis, recent hip fx s/p surgery at [facility] 03/08/2020 and was tx to SNF for rehab.  Pt then admitted to [facility] hospital 12/15-12/23 from [name] where he was treated for dysphagia and underwent EGD (esophagitis), MBSS (rec dys and nectar) and was treated with Benadryl and SoluMedrol.  Pt now admitted with dysphagia, dehydration with poor po intake.  Wife and pt report, pt took Robitussin and Tylenol (wife states occurred at [facility] when there for rehab) and within one hour pt says he "could not swallow". CXR negative.  Speech/swallow eval ordered.  Pt and wife state that he needs the medications he received from [place] 10 years ago that resolved his dysphagia. SLP informed pt and wife to changes of swallowing with age and this would be expounded with pt's baseline dysphagia from his DISH *osteophyte C5-C6.   Assessment / Plan / Recommendation Clinical Impression  Patient currently presents with clinical indications of aspiration of secretions and minimal po intake  consumed *ice chips, nectar thick cranberry juice via tsp*.  NO focal CN deficits present, thus suspect this dysphagia is due to baseline known DISH, possible deconditioning s/p fall with hip sx and ? edema from potential allergic response to Robitussin.  Chronic throat clearing noted before po offered presumed due to secretion retention/aspiration.  Ice chip consumption also followed by frequent throat clearing/cough but pt verbalized the ice was helping saying "It's getting better than it has in 2 weeks".  Unfortunately, as soon as pt was given nectar thick cranberry via tsp, he demonstrated explosive coughing with clear discomfort and expectoration of secretions mixed with cranberry juice.  Did not provide furhter trials due to obvious discomfort with aspiration.    Recommend pt be allowed ice chips and otherwise continue npo. Pt and wife both state they are open to feeding tube as pt says "I need food to live".  SLP educated pt and wife to feeding tube providing nutrition/hydration but not preventing aspiration.  At this time, pt's swallow dysfunction does not allow him to consume nutrition.  SLP questions if there is some reversible component such as edema from allergic response that could cause severe acute exacerbation of dysphagia and if medical tx may mitigate edema.  Do not recommend MBS at this time, as it would not change outcomes, however will follow for readiness for MBS.   Of note, wife also reports pt refuses to drink nectar thick liquids and SLP advised her to cost/benefit of treatment with hydration needs being very important, especially  as pt has a chronic cathether.  SLP reviewed recommendation for oral care and ice chips.    Concern is present for current level of dysphagia *chronically exacerbated since at least 12/15*, decreased nutrition and hydration and deconditioning.  If family/pt determine PEG is desired, would recommend pt continue ice chips po and pursue SLP at SNF for trial  dysphagia treatement.  SLP then educated pt/wife to feeding tube providing nutrition but does not prevent aspiration. Discussed findings with MD. SLP Visit Diagnosis: Dysphagia, pharyngoesophageal phase (R13.14);Dysphagia, unspecified (R13.10)    Aspiration Risk  Severe aspiration risk;Risk for inadequate nutrition/hydration    Diet Recommendation NPO;Ice chips PRN after oral care   Medication Administration: Via alternative means Supervision: Patient able to self feed Compensations:  (multiple swallows and expectorate prn) Postural Changes: Seated upright at 90 degrees    Other  Recommendations     Follow up Recommendations Skilled Nursing facility      Frequency and Duration min 2x/week  2 weeks       Prognosis Prognosis for Safe Diet Advancement: Guarded Barriers to Reach Goals: Severity of deficits;Time post onset (chronicity)      Swallow Study   General Date of Onset: 03/27/20 HPI: 86 yo male with complex med hx including dysphagia due to DISH, esophagitis, recent hip fx s/p surgery at [facility] 03/08/2020 and was tx to SNF for rehab.  Pt then admitted to [facility] hospital 12/15-12/23 from [name] where he was treated for dysphagia and underwent EGD (esophagitis), MBSS (rec dys and nectar) and was treated with Benadryl and SoluMedrol.  Pt now admitted with dysphagia, dehydration with poor po intake.  Wife and pt report, pt took Robitussin and Tylenol (wife states occurred at [name] when there for rehab) and within one hour pt says he "could not swallow". CXR negative.  Speech/swallow eval ordered.  Pt and wife state that he needs the medications he received from [place] 10 years ago that resolved his dysphagia. SLP informed pt and wife to changes of swallowing with age and this would be expounded with pt's baseline dysphagia from his DISH *osteophyte C5-C6. Type of Study: Bedside Swallow Evaluation Diet Prior to this Study: NPO Temperature Spikes Noted: No Respiratory  Status: Room air History of Recent Intubation: No (? 12/15 for surgery) Behavior/Cognition: Alert;Cooperative (wife says pt is "confused" when speaking about location of Robitussin consumption) Oral Cavity Assessment: Within Functional Limits Oral Care Completed by SLP: No Oral Cavity - Dentition: Other (Comment) (some missing, pt has partials but does not use them) Vision: Functional for self-feeding Self-Feeding Abilities: Able to feed self Patient Positioning: Upright in bed Baseline Vocal Quality: Hoarse;Other (comment) ("wet voice" at times) Volitional Cough: Congested;Strong Volitional Swallow: Able to elicit    Oral/Motor/Sensory Function Overall Oral Motor/Sensory Function: Within functional limits   Ice Chips Ice chips: Impaired Presentation: Self Fed;Spoon Pharyngeal Phase Impairments: Multiple swallows;Throat Clearing - Immediate;Cough - Delayed;Throat Clearing - Delayed   Thin Liquid Thin Liquid: Not tested    Nectar Thick Nectar Thick Liquid: Impaired Pharyngeal Phase Impairments: Multiple swallows;Wet Vocal Quality;Throat Clearing - Immediate;Cough - Immediate Other Comments: Immediate overt coughing with expectoration of secretions mixed with cranberry juice residuals, suspect overt aspiration due to poor pharyngeal clearance likely due to DISH and ? exacerbated by secretions retained in pharynx at baseline.   Honey Thick Honey Thick Liquid: Not tested   Puree Puree: Not tested   Solid     Solid: Not tested      [name] 03/27/2020,2:00 PM  Kathleen Lime, MS [facility] SLP Acute Rehab Services Office [phone] Pager [phone]

## 2020-03-27 NOTE — Progress Notes (Signed)
24 staples removed, tolerated well. No c/o pain nor discomfort. Skin is clean and dry, steri strips applied

## 2020-03-27 NOTE — Telephone Encounter (Signed)
Patient is in the hospital. Chastity from the rehab center called. Would like to know if Dr. Lorin Mercy could visit him in the hospital. Her call back number is [phone]

## 2020-03-27 NOTE — Telephone Encounter (Signed)
Please advise 

## 2020-03-27 NOTE — Consult Note (Addendum)
WOC Nurse Consult Note: Reason for Consult:RIght lateral heel pressure injury, Unstageable. Wound type:pressure Pressure Injury POA: Yes Measurement:2cm x 1.5cm with depth unable to be determined due to the presence of nonviable tissue (eschar) Wound bed:see above Drainage (amount, consistency, odor) None Periwound:erythematous Dressing procedure/placement/frequency: Unna's Boot has been applied, but we will discontinue this in favor of performing twice daily wound care to the affected area using an antimicrobial and astringent (betadine) swabstick application in an effort to get the nonviable tissue to dry and fall off (spontaneously debride).  Bilateral pressure redistribution heel boots are provided to both correct the lateral rotation of the feet and to float the heels.  [facility] nursing team will not follow, but will remain available to this patient, the nursing and medical teams.  Please re-consult if needed. Thanks, Maudie Flakes, MSN, RN, [place], Arther Abbott  Pager# [phone]

## 2020-03-27 NOTE — Progress Notes (Signed)
PHARMACY - PHYSICIAN COMMUNICATION CRITICAL VALUE ALERT - BLOOD CULTURE IDENTIFICATION (BCID)  John Walton is an 86 y.o. male who presented to [facility] on 03/26/2020 with a chief complaint of dysphagia, fever   Name of physician (or Provider) Contacted: M. Sharlet Salina  Current antibiotics: none  Changes to prescribed antibiotics recommended:  None recommended No results found for this or any previous visit.  Dolly Rias RPh 03/27/2020, 10:25 PM

## 2020-03-28 ENCOUNTER — Ambulatory Visit: Payer: Medicare Other | Admitting: Orthopaedic Surgery

## 2020-03-28 DIAGNOSIS — R1310 Dysphagia, unspecified: Secondary | ICD-10-CM | POA: Diagnosis not present

## 2020-03-28 LAB — BLOOD CULTURE ID PANEL (REFLEXED) - BCID2

## 2020-03-28 LAB — BASIC METABOLIC PANEL
Anion gap: 11 (ref 5–15)
BUN: 27 mg/dL — ABNORMAL HIGH (ref 8–23)
CO2: 21 mmol/L — ABNORMAL LOW (ref 22–32)
Calcium: 7.7 mg/dL — ABNORMAL LOW (ref 8.9–10.3)
Chloride: 118 mmol/L — ABNORMAL HIGH (ref 98–111)
Creatinine, Ser: 1.58 mg/dL — ABNORMAL HIGH (ref 0.61–1.24)
GFR, Estimated: 43 mL/min — ABNORMAL LOW (ref >60)
Glucose, Bld: 99 mg/dL (ref 70–99)
Potassium: 3.2 mmol/L — ABNORMAL LOW (ref 3.5–5.1)
Sodium: 150 mmol/L — ABNORMAL HIGH (ref 135–145)

## 2020-03-28 LAB — GLUCOSE, CAPILLARY
Glucose-Capillary: 118 mg/dL — ABNORMAL HIGH (ref 70–99)
Glucose-Capillary: 123 mg/dL — ABNORMAL HIGH (ref 70–99)
Glucose-Capillary: 147 mg/dL — ABNORMAL HIGH (ref 70–99)
Glucose-Capillary: 148 mg/dL — ABNORMAL HIGH (ref 70–99)
Glucose-Capillary: 96 mg/dL (ref 70–99)

## 2020-03-28 LAB — CBC WITH DIFFERENTIAL/PLATELET
Abs Immature Granulocytes: 0.04 10*3/uL (ref 0.00–0.07)
Basophils Absolute: 0 10*3/uL (ref 0.0–0.1)
Basophils Relative: 0 %
Eosinophils Absolute: 0.2 10*3/uL (ref 0.0–0.5)
Eosinophils Relative: 3 %
HCT: 28.6 % — ABNORMAL LOW (ref 39.0–52.0)
Hemoglobin: 8.8 g/dL — ABNORMAL LOW (ref 13.0–17.0)
Immature Granulocytes: 1 %
Lymphocytes Relative: 8 %
Lymphs Abs: 0.5 10*3/uL — ABNORMAL LOW (ref 0.7–4.0)
MCH: 28.7 pg (ref 26.0–34.0)
MCHC: 30.8 g/dL (ref 30.0–36.0)
MCV: 93.2 fL (ref 80.0–100.0)
Monocytes Absolute: 0.8 10*3/uL (ref 0.1–1.0)
Monocytes Relative: 14 %
Neutro Abs: 4.5 10*3/uL (ref 1.7–7.7)
Neutrophils Relative %: 74 %
Platelets: 290 10*3/uL (ref 150–400)
RBC: 3.07 MIL/uL — ABNORMAL LOW (ref 4.22–5.81)
RDW: 13.6 % (ref 11.5–15.5)
WBC: 6 10*3/uL (ref 4.0–10.5)
nRBC: 0 % (ref 0.0–0.2)

## 2020-03-28 MED ORDER — PROSOURCE PLUS PO LIQD
30.0000 mL | Freq: Two times a day (BID) | ORAL | Status: DC
  Administered 2020-04-01 – 2020-04-04 (×7): 30 mL via ORAL
  Filled 2020-03-28 (×7): qty 30

## 2020-03-28 MED ORDER — SUCRALFATE 1 GM/10ML PO SUSP
1.0000 g | Freq: Three times a day (TID) | ORAL | Status: DC
  Administered 2020-03-30 – 2020-04-04 (×17): 1 g via ORAL
  Filled 2020-03-28 (×18): qty 10

## 2020-03-28 MED ORDER — TAMSULOSIN HCL 0.4 MG PO CAPS
0.4000 mg | ORAL_CAPSULE | Freq: Every day | ORAL | Status: DC
  Administered 2020-03-30 – 2020-04-01 (×2): 0.4 mg via ORAL
  Filled 2020-03-28 (×4): qty 1

## 2020-03-28 MED ORDER — DEXTROSE 50 % IV SOLN
INTRAVENOUS | Status: AC
  Administered 2020-03-28: 50 mL
  Filled 2020-03-28: qty 50

## 2020-03-28 MED ORDER — PANTOPRAZOLE SODIUM 40 MG IV SOLR
40.0000 mg | Freq: Two times a day (BID) | INTRAVENOUS | Status: DC
  Administered 2020-03-28 – 2020-04-04 (×14): 40 mg via INTRAVENOUS
  Filled 2020-03-28 (×14): qty 40

## 2020-03-28 MED ORDER — ADULT MULTIVITAMIN LIQUID CH
15.0000 mL | Freq: Every day | ORAL | Status: DC
  Administered 2020-03-30 – 2020-04-04 (×6): 15 mL via ORAL
  Filled 2020-03-28 (×8): qty 15

## 2020-03-28 MED ORDER — DEXTROSE-NACL 5-0.45 % IV SOLN: INTRAVENOUS | Status: DC

## 2020-03-28 MED ORDER — BOOST / RESOURCE BREEZE PO LIQD CUSTOM
1.0000 | Freq: Three times a day (TID) | ORAL | Status: DC
  Administered 2020-04-01 – 2020-04-02 (×4): 1 via ORAL

## 2020-03-28 MED ORDER — DEXTROSE 50 % IV SOLN
12.5000 g | INTRAVENOUS | Status: AC
  Administered 2020-03-28: 12.5 g via INTRAVENOUS

## 2020-03-28 NOTE — Progress Notes (Signed)
  Speech Language Pathology Treatment: Dysphagia  Patient Details Name: AURYN PAIGE MRN: [id number] DOB: [birth year] Today's Date: 03/28/2020 Time: 4128-7867 SLP Time Calculation (min) (ACUTE ONLY): 25 min  Assessment / Plan / Recommendation Clinical Impression  Patient seen with wife present in room to address dysphagia goals. Patient stated that today is not a good day as far as his swallowing but yesterday he did alright. He continues to endorse inability to swallow even small amounts of liquids as well as need to expectorate secretions. Of note, these symptoms are fairly identical to those reported in hospitalization almost 10 years ago (hospital in [place]; patient and wife had records sent over). Patient took one small ice chip during today's session and had immediate and delayed throat clearing and expectoration of thick whitish-clear secretions. No coughing and throat clearing appeared related to need to expectorate rather than a true penetration or aspiration event. Patient and wife both report that patient's difficulty with swallowing was almost instantaneous after taking robitussin and tylenol and that this was the cause of temporary dysphagia during hospitalization 10 years ago.  SLP consulted with patient's MD here in hospital and we agreed upon starting patient on clear liquids in anticipation of him having some improvement in ability to swallow, but knowing that currently he is likely not going to take in much at all of liquid PO's. Patient will likely need an MBS to assess swallow function and determine current level of risk with PO intake.    HPI HPI: 86 yo male with complex med hx including dysphagia due to DISH, esophagitis, recent hip fx s/p surgery at [facility] 03/08/2020 and was tx to SNF for rehab.  Pt then admitted to [facility] hospital 12/15-12/23 from [facility] where he was treated for dysphagia and underwent EGD (esophagitis), MBSS (rec dys and nectar) and was treated with Benadryl and  SoluMedrol.  Pt now admitted with dysphagia, dehydration with poor po intake.  Wife and pt report, pt took Robitussin and Tylenol (wife states occurred at [facility] when there for rehab) and within one hour pt says he "could not swallow". CXR negative.  Speech/swallow eval ordered.  Pt and wife state that he needs the medications he received from [place] 10 years ago that resolved his dysphagia. SLP informed pt and wife to changes of swallowing with age and this would be expounded with pt's baseline dysphagia from his DISH *osteophyte C5-C6.      SLP Plan  Continue with current plan of care       Recommendations  Diet recommendations: Thin liquid Liquids provided via: Cup;Teaspoon;Straw Medication Administration: Via alternative means Supervision: Patient able to self feed Compensations: Slow rate;Small sips/bites Postural Changes and/or Swallow Maneuvers: Seated upright 90 degrees                Oral Care Recommendations: Oral care BID Follow up Recommendations: Skilled Nursing facility SLP Visit Diagnosis: Dysphagia, pharyngoesophageal phase (R13.14);Dysphagia, unspecified (R13.10) Plan: Continue with current plan of care       [place], MA, CCC-SLP Speech Therapy

## 2020-03-28 NOTE — Progress Notes (Addendum)
Hypoglycemic episode, patient has CBG of 66. Protocol started, D50 (12.5gm) given and CBG will be rechecked in 15 minutes. MD on call notified. Patient is currently NPO.    Update: CBG is currently 148 after administration of D50.  Patient is stable and will continue to be monitored.

## 2020-03-28 NOTE — TOC Initial Note (Signed)
Transition of Care [facility]) - Initial/Assessment Note    Patient Details  Name: John Walton MRN: [id number] Date of Birth: [birth year]  Transition of Care (TOC) CM/SW Contact:    Joaquin Courts, RN Phone Number: 03/28/2020, 11:45 AM  Clinical Narrative:                 CM noted patient was at [facility] for short term rehab.  CM spoke with facility rep who reports family is holding patient's bed for return.  Per rep, patient was utilizing his medicare benefits not VA for facility stay.  Once patient is closer to dc, facility will need an updated FL2 and covid test to return.   Expected Discharge Plan: [place] Barriers to Discharge: Continued Medical Work up   Patient Goals and CMS Choice Patient states their goals for this hospitalization and ongoing recovery are:: to return to [employer].gov Compare Post Acute Care list provided to:: Patient Choice offered to / list presented to : [facility]  Expected Discharge Plan and Services Expected Discharge Plan: [place]     Post Acute Care Choice: Resumption of Svcs/PTA Provider Living arrangements for the past 2 months: Single Family Home                                      Prior Living Arrangements/Services Living arrangements for the past 2 months: Single Family Home Lives with:: Spouse Patient language and need for interpreter reviewed:: Yes        Need for Family Participation in Patient Care: Yes (Comment) Care giver support system in place?: Yes (comment)   Criminal Activity/Legal Involvement Pertinent to Current Situation/Hospitalization: No - Comment as needed  Activities of Daily Living Home Assistive Devices/Equipment: Hearing aid,Walker (specify type),Eyeglasses (bilateral hearing aides, walker with seat) ADL Screening (condition at time of admission) Patient's cognitive ability adequate to safely complete daily activities?: No Is the patient deaf or  have difficulty hearing?: Yes (2 hearing aides) Does the patient have difficulty seeing, even when wearing glasses/contacts?: Yes (blood in left eye, suppose to have laser surgery on his retina soon) Does the patient have difficulty concentrating, remembering, or making decisions?: Yes (trouble with memory) Patient able to express need for assistance with ADLs?: Yes Does the patient have difficulty dressing or bathing?: Yes Independently performs ADLs?: No Communication: Independent Dressing (OT): Needs assistance Is this a change from baseline?: Pre-admission baseline Grooming: Needs assistance Is this a change from baseline?: Pre-admission baseline Feeding: Needs assistance Is this a change from baseline?: Pre-admission baseline Bathing: Needs assistance Is this a change from baseline?: Pre-admission baseline Toileting: Needs assistance Is this a change from baseline?: Pre-admission baseline In/Out Bed: Needs assistance Is this a change from baseline?: Change from baseline, expected to last >3 days Walks in Home: Dependent Is this a change from baseline?: Pre-admission baseline Does the patient have difficulty walking or climbing stairs?: Yes Weakness of Legs: Left Weakness of Arms/Hands: None  Permission Sought/Granted                  Emotional Assessment Appearance:: Appears stated age         Psych Involvement: No (comment)  Admission diagnosis:  Dysphagia [R13.10] Dysphagia, unspecified type [R13.10] Patient Active Problem List   Diagnosis Date Noted  . Dysphagia 03/26/2020  . Bilateral hydronephrosis 03/08/2020  . Unilateral primary osteoarthritis, left hip   . Femoral neck  fracture ([place]) 03/07/2020  . CKD (chronic kidney disease), stage IV ([place]) 03/07/2020  . Normocytic anemia 03/07/2020  . BPH (benign prostatic hyperplasia) 03/07/2020  . Abdominal mass 03/07/2020  . Scalp hematoma 03/07/2020  . Hypertensive urgency 03/07/2020  . Pain due to  onychomycosis of toenails of both feet 08/13/2019  . RBBB 09/02/2014  . Screening, ischemic heart disease 07/11/2014  . Medicare annual wellness visit, subsequent 07/11/2014  . Prostate cancer screening 07/11/2014  . Essential hypertension 01/11/2014  . Hyperlipidemia 01/11/2014   PCP:  [name], MD Pharmacy:   [id number] [place], [address] [place] [address] Phone: [phone] Fax: [phone]     Social Determinants of Health (SDOH) Interventions    Readmission Risk Interventions No flowsheet data found.

## 2020-03-28 NOTE — Progress Notes (Addendum)
Initial Nutrition Assessment  DOCUMENTATION CODES:   Severe malnutrition in context of chronic illness  INTERVENTION:   Monitor magnesium, potassium, and phosphorus daily for at least 3 days, MD to replete as needed, as pt is at risk for refeeding syndrome.  While on clear liquids: -Boost Breeze po TID, each supplement provides 250 kcal and 9 grams of protein -Prosource Plus PO BID, each provides 100 kcals and 15g protein -Liquid MVI daily -48 hour Calorie Count: 12/28-12/29 -on clears so likely won't meet needs  If feeding tube pursued: -Please consult for TF initiation/management -Initiate Osmolite 1.5 @ 20 ml/hr, advance by 10 ml every 12 hours to goal rate of 60 ml/hr. -Free water of  -Provides 1980 kcals, 82g protein and 1097 ml H2O.   NUTRITION DIAGNOSIS:   Severe Malnutrition related to chronic illness,dysphagia as evidenced by percent weight loss,energy intake < or equal to 75% for > or equal to 1 month,severe fat depletion,severe muscle depletion.  GOAL:   Patient will meet greater than or equal to 90% of their needs  MONITOR:   PO intake,Supplement acceptance,Labs,Weight trends,I & O's,Skin  REASON FOR ASSESSMENT:   Consult Assessment of nutrition requirement/status,Calorie Count  ASSESSMENT:   86 year old male with history of hypertension, hyperlipidemia, dysphagia who presented from [place] facility for the evaluation of dysphagia, fever.  He recently had hip replacement and is currently residing in a skilled nursing facility.  He is mostly bedbound since last few weeks.  Most of the information was taken from his wife.  As per the wife, he had swallowing issues about 10 years ago which automatically resolved after he was giving some antiallergic medication/Benadryl.  He had this issue again few weeks ago and he was worked up extensively at [facility].  He underwent EGD, swallowing evaluation; the studies were largely  unremarkable except for mild esophagitis.  He was then sent back to skilled nursing facility.  GI/speech following here.  Patient and family are interested on PEG placement if he is found to have persistent dysphagia.  Patient in room with wife at bedside. Most of history from wife as pt is lethargic. He is able to answer yes/no questions. Pt with noticeable weakness.   Per wife, pt had hip surgery on 12/8. Prior to this surgery he was eating a little less but still tried to eat 3 meals a day. Once he went to his facility he started developing dysphagia. Wife suspects d/t medication allergy (h/o this happening 10 years ago when he was given tylenol+Robitussiin).  Per wife, pt did pass a swallowing evaluation on 12/23. Was sent back to his facility but the dysphagia returned since. He has had very minimal nutrition since 12/13-12/14 per pt's wife.   Pt was evaluated at [facility], RD there saw him and recommended tube feeding given moderate malnutrition at the time. Malnutrition has since worsened, now meets criteria for severe malnutrition.  SLP evaluated pt 12/27: recommended NPO with ice chips. Pt with severe aspiration risk.  GI saw pt today, per their note: CT revealed large osteophyte causing mass effect on the esophagus. Ordered Calorie Count. If inadequate PO intake, will consider G-tube placement.   Will leave TF recommendations above if feeding tube pursued. Noted clear liquid diet ordered. Will order clear liquid supplements.  Per pt's wife, pt weighed 151 lbs in October this year. Per weights in care everywhere, pt weighted 149 lbs on 12/15 (9% wt loss x 2 weeks ,significant for time frame).  Medications: D5 infusion  Labs reviewed: CBGs: 96-123 Elevated Na Low K  NUTRITION - FOCUSED PHYSICAL EXAM:  Flowsheet Row Most Recent Value  Orbital Region Severe depletion  Upper Arm Region Severe depletion  Thoracic and Lumbar Region Unable to assess  Buccal Region Severe  depletion  Temple Region Severe depletion  Clavicle Bone Region Severe depletion  Clavicle and Acromion Bone Region Severe depletion  Scapular Bone Region Severe depletion  Dorsal Hand Mild depletion  Patellar Region Unable to assess  Anterior Thigh Region Unable to assess  Posterior Calf Region Unable to assess  Edema (RD Assessment) None       Diet Order:   Diet Order            Diet clear liquid Room service appropriate? Yes; Fluid consistency: Thin  Diet effective now                 EDUCATION NEEDS:   Education needs have been addressed  Skin:  Skin Assessment: Skin Integrity Issues: Skin Integrity Issues:: Incisions,Unstageable Unstageable: right lateral heel PI Incisions: left hip  Last BM:  PTA  Height:   Ht Readings from Last 1 Encounters:  03/26/20 5\' 6"  (1.676 m)    Weight:   Wt Readings from Last 1 Encounters:  03/26/20 61.2 kg   BMI:  Body mass index is 21.79 kg/m.  Estimated Nutritional Needs:   Kcal:  1800-2000  Protein:  90-100g  Fluid:  2L/day   Clayton Bibles, MS, RD, LDN Inpatient Clinical Dietitian Contact information available via Amion

## 2020-03-28 NOTE — Progress Notes (Signed)
PROGRESS NOTE    John Walton  YFV:[id number] DOB: April 27, 1932 DOA: 03/26/2020 PCP: [name], MD   Chief Complain: Fever, dysphagia  Brief Narrative: Patient is a 86 year old male with history of hypertension, hyperlipidemia, dysphagia who presented from [facility] facility for the evaluation of dysphagia, fever.  He recently had hip replacement and is currently residing in a skilled nursing facility.  He is mostly bedbound since last few weeks.  Most of the information was taken from his wife.  As per the wife, he had swallowing issues about 10 years ago which automatically resolved after he was giving some antiallergic medication/Benadryl.  He had this issue again few weeks ago and he was worked up extensively at [facility].  He underwent EGD, swallowing evaluation; the studies were largely unremarkable except for mild esophagitis.  He was then sent back to skilled nursing facility. GI/speech following here.  Patient and family are interested on PEG placement if he is found to have persistent dysphagia.  Assessment & Plan:   Active Problems:   Dysphagia   Dysphagia: Intermittent dysphagia since last 10 years.  Unknown etiology.  He was recently [facility] and had extensive work-up: Speech evaluation, EGD.  Work-up was largely negative except for mild esophagitis,gastritis,duodenitis.Continue protonix IV,carafate. Speech therapy following and planning for modified barium swallow.  Patient and family are interested on PEG placement if he has persistent dysphagia or if recommended by GI.  We will continue clear liquid diet for now.  His dysphagia could be multifactorial which could be from osteophyte at C5-C6, poor esophageal motility ,esophagitis.  Chronic indwelling Foley catheter: He was discharged to rehab with Foley catheter from [facility]. He has been recommended to follow-up with urology at [facility].  We will  continue Foley catheter for now.Added flomax  DISH: CT head neck was ordered to evaluate for other potential causes at [facility]. CT head and neck demonstrated DISH with large osteophyte at C5-C6 causing mass-effect on the esophagus ,this was same finding that was done in imagings in [place] in 2011. Spinal surgery was consulted at [facility] and concluded that there was no safe way for removal of the osteophyte ,so nonsurgical management was recommended.  Report of fever: Currently afebrile.  Aspiration pneumonia suspected but chest x-ray did not show any pneumonia.  Currently on room air.  Lungs are clear.Afebrile now  Hypernatremia: Could be from dehydration.  Started on hypotonic fluids.  Will check his BMP tomorrow.  Moderate protein calorie malnutirtion: Albumin in the range of 2.  requested dietary consultation  Hyperlipidemia: On statin at home  Diabetes type 2: Monitor blood sugars.  Continue sliding scale insulin for now.  CKD stage 4: He was evaluated by nephrology at [facility].  Currently kidney function at baseline.  We recommend to follow-up with Dr. Isaias Sakai, as an outpatient  Normocytic anemia/iron deficiency: Most likely from poor oral intake.  Iron studies showed low iron.  Given a dose of IV iron.  Needs oral supplementation on discharge.  Debility/deconditioning: He was recently hospitalized here after mechanical fall with left hip fracture requiring surgery on 12/8 and was discharged to skilled nursing facility.  Requested  PT/OT consultation            DVT prophylaxis:Heparin [place] Code Status: Full Family Communication: wife at bedside Status is: Inpatient  Remains inpatient appropriate because:Inpatient level of care appropriate due to severity of illness   Dispo: The  patient is from: SNF              Anticipated d/c is to: SNF              Anticipated d/c date is: 3 days              Patient currently is not  medically stable to d/c.  Currently being worked up for dysphagia   Consultants: None  Procedures:None  Antimicrobials:  Anti-infectives (From admission, onward)   None      Subjective: Patient seen and examined at the bedside this morning.  Wife at the bedside.  He looks comfortable, hemodynamically stable, denies any complaints.   Monitor blood sugars Vitals:   03/27/20 1317 03/27/20 2053 03/27/20 2121 03/28/20 0621  BP: (!) 143/61 (!) 174/57 (!) 156/66 (!) 154/72  Pulse: 79 68  84  Resp: 18 18  18   Temp: 98.4 F (36.9 C) 98.3 F (36.8 C)  (!) 97.3 F (36.3 C)  TempSrc: Oral Oral  Oral  SpO2: 98% 96%  98%  Weight:      Height:        Intake/Output Summary (Last 24 hours) at 03/28/2020 0736 Last data filed at 03/28/2020 0631 Gross per 24 hour  Intake 812.17 ml  Output 2500 ml  Net -1687.83 ml   Filed Weights   03/26/20 1727  Weight: 61.2 kg    Examination:  General exam: Extremely deconditioned, debilitated, chronically ill looking, malnourished Respiratory system: Bilateral equal air entry, normal vesicular breath sounds, no wheezes or crackles  Cardiovascular system: S1 & S2 heard, RRR. No JVD, murmurs, rubs, gallops or clicks. Gastrointestinal system: Abdomen is nondistended, soft and nontender. No organomegaly or masses felt. Normal bowel sounds heard. Central nervous system: Alert and oriented. No focal neurological deficits. Extremities: No edema, no clubbing ,no cyanosis,healing surgical wound on the left hip Skin: Right lateral heel pressure injury present on admission GU: Foley      Data Reviewed: I have personally reviewed following labs and imaging studies  CBC: Recent Labs  Lab 03/26/20 1049 03/26/20 1833 03/27/20 0415 03/28/20 0546  WBC 13.3* 9.5 8.5 6.0  NEUTROABS 11.8*  --   --  4.5  HGB 9.8* 9.2* 8.3* 8.8*  HCT 31.2* 30.4* 27.0* 28.6*  MCV 91.2 94.1 93.8 93.2  PLT 415* 383 307 376   Basic Metabolic Panel: Recent Labs   Lab 03/26/20 1049 03/26/20 1833 03/27/20 0415 03/28/20 0546  NA 146*  --  149* 150*  K 3.8  --  3.5 3.2*  CL 112*  --  117* 118*  CO2 21*  --  20* 21*  GLUCOSE 86  --  78 99  BUN 33*  --  27* 27*  CREATININE 1.90* 1.70* 1.66* 1.58*  CALCIUM 8.2*  --  7.7* 7.7*   GFR: Estimated Creatinine Clearance: 29.6 mL/min (A) (by C-G formula based on SCr of 1.58 mg/dL (H)). Liver Function Tests: Recent Labs  Lab 03/26/20 1049 03/27/20 0415  AST 28 17  ALT 31 21  ALKPHOS 83 68  BILITOT 0.9 0.7  PROT 6.7 5.8*  ALBUMIN 2.8* 2.5*   No results for input(s): LIPASE, AMYLASE in the last 168 hours. No results for input(s): AMMONIA in the last 168 hours. Coagulation Profile: No results for input(s): INR, PROTIME in the last 168 hours. Cardiac Enzymes: No results for input(s): CKTOTAL, CKMB, CKMBINDEX, TROPONINI in the last 168 hours. BNP (last 3 results) No results for input(s): PROBNP in the last 8760  hours. HbA1C: No results for input(s): HGBA1C in the last 72 hours. CBG: Recent Labs  Lab 03/27/20 2022 03/27/20 2350 03/27/20 2351 03/28/20 0051 03/28/20 0732  GLUCAP 72 66* 70 148* 96   Lipid Profile: No results for input(s): CHOL, HDL, LDLCALC, TRIG, CHOLHDL, LDLDIRECT in the last 72 hours. Thyroid Function Tests: No results for input(s): TSH, T4TOTAL, FREET4, T3FREE, THYROIDAB in the last 72 hours. Anemia Panel: Recent Labs    03/26/20 1833  FERRITIN 112  TIBC 195*  IRON 18*   Sepsis Labs: No results for input(s): PROCALCITON, LATICACIDVEN in the last 168 hours.  Recent Results (from the past 240 hour(s))  Resp Panel by RT-PCR (Flu A&B, Covid) Nasopharyngeal Swab     Status: None   Collection Time: 03/26/20 10:49 AM   Specimen: Nasopharyngeal Swab; Nasopharyngeal(NP) swabs in vial transport medium  Result Value Ref Range Status   SARS Coronavirus 2 by RT PCR NEGATIVE NEGATIVE Final    Comment: (NOTE) SARS-CoV-2 target nucleic acids are NOT DETECTED.  The  SARS-CoV-2 RNA is generally detectable in upper respiratory specimens during the acute phase of infection. The lowest concentration of SARS-CoV-2 viral copies this assay can detect is 138 copies/mL. A negative result does not preclude SARS-Cov-2 infection and should not be used as the sole basis for treatment or other patient management decisions. A negative result may occur with  improper specimen collection/handling, submission of specimen other than nasopharyngeal swab, presence of viral mutation(s) within the areas targeted by this assay, and inadequate number of viral copies(<138 copies/mL). A negative result must be combined with clinical observations, patient history, and epidemiological information. The expected result is Negative.  Fact Sheet for Patients:  EntrepreneurPulse.com.au  Fact Sheet for Healthcare Providers:  IncredibleEmployment.be  This test is no t yet approved or cleared by the [place] FDA and  has been authorized for detection and/or diagnosis of SARS-CoV-2 by FDA under an Emergency Use Authorization (EUA). This EUA will remain  in effect (meaning this test can be used) for the duration of the COVID-19 declaration under Section 564(b)(1) of the Act, 21 U.S.C.section 360bbb-3(b)(1), unless the authorization is terminated  or revoked sooner.       Influenza A by PCR NEGATIVE NEGATIVE Final   Influenza B by PCR NEGATIVE NEGATIVE Final    Comment: (NOTE) The Xpert Xpress SARS-CoV-2/FLU/RSV plus assay is intended as an aid in the diagnosis of influenza from Nasopharyngeal swab specimens and should not be used as a sole basis for treatment. Nasal washings and aspirates are unacceptable for Xpert Xpress SARS-CoV-2/FLU/RSV testing.  Fact Sheet for Patients: EntrepreneurPulse.com.au  Fact Sheet for Healthcare Providers: IncredibleEmployment.be  This test is not yet approved or  cleared by the [place] FDA and has been authorized for detection and/or diagnosis of SARS-CoV-2 by FDA under an Emergency Use Authorization (EUA). This EUA will remain in effect (meaning this test can be used) for the duration of the COVID-19 declaration under Section 564(b)(1) of the Act, 21 U.S.C. section 360bbb-3(b)(1), unless the authorization is terminated or revoked.  Performed at [facility], [place] [address], [place], [place] [address]   Culture, blood (routine x 2)     Status: None (Preliminary result)   Collection Time: 03/26/20  6:33 PM   Specimen: BLOOD  Result Value Ref Range Status   Specimen Description   Final    BLOOD LEFT HAND Performed at [place] [address], [place], [place] [address]    Special Requests  Final    BOTTLES DRAWN AEROBIC ONLY Blood Culture adequate volume Performed at [place] [address] [place], [place] [address]    Culture  Setup Time   Final    GRAM POSITIVE COCCI AEROBIC BOTTLE ONLY Organism ID to follow CRITICAL RESULT CALLED TO, READ BACK BY AND VERIFIED WITH: PHARMD E JACKSON 03/27/20 AT 2213 SK Performed at [facility], [place] [address] [place], [place] [address]    Culture PENDING  Incomplete   Report Status PENDING  Incomplete  Culture, blood (routine x 2)     Status: None (Preliminary result)   Collection Time: 03/26/20  6:33 PM   Specimen: BLOOD  Result Value Ref Range Status   Specimen Description   Final    BLOOD LEFT ANTECUBITAL Performed at [place] [address], [place], [place] [address]    Special Requests   Final    BOTTLES DRAWN AEROBIC ONLY Blood Culture adequate volume Performed at [place] [address], [place], [place] [address]    Culture   Final    NO GROWTH < 12 HOURS Performed at [place] [address] [place],  [address]    Report Status PENDING   Incomplete         Radiology Studies: DG Chest Portable 1 View  Result Date: 03/26/2020 CLINICAL DATA:  Choose recent choking episode EXAM: PORTABLE CHEST 1 VIEW COMPARISON:  03/19/2020 FINDINGS: Cardiac shadow is within normal limits. Aortic calcifications are noted. Lungs are well aerated bilaterally. No focal infiltrate or sizable effusion is seen. No bony abnormality is noted. IMPRESSION: No active disease. Electronically Signed   By: Inez Catalina M.D.   On: 03/26/2020 09:50        Scheduled Meds: . alum & mag hydroxide-simeth  30 mL Oral Once   And  . lidocaine  15 mL Oral Once  . Chlorhexidine Gluconate Cloth  6 each Topical Daily  . heparin  5,000 Units Subcutaneous Q8H  . insulin aspart  0-5 Units Subcutaneous QHS  . insulin aspart  0-9 Units Subcutaneous TID WC  . latanoprost  1 drop Both Eyes QHS  . pantoprazole (PROTONIX) IV  40 mg Intravenous Q24H  . polyvinyl alcohol  1 drop Both Eyes TID   Continuous Infusions: . dextrose 5 % and 0.45% NaCl       LOS: 2 days    Time spent: 35 mins.More than 50% of that time was spent in counseling and/or coordination of care.      Shelly Coss, MD [facility] P12/28/2021, 7:36 AM

## 2020-03-28 NOTE — Consult Note (Signed)
Referring Provider: [facility] Primary Care Physician:  [name], MD Primary Gastroenterologist:  Althia Forts  Reason for Consultation:  Dysphagia  HPI: John Walton is a 86 y.o. male with history of dysphagia presenting for consultation of dysphagia.    Patient recently had a mechanical fall and hip fracture, underwent surgical repair, and was discharged to a skilled nursing facility.  Since that time, he has noted worsening of dysphagia.  He underwent EGD 03/17/20 while hospitalized in [facility].  Report not available, but it was reported that patient had mild esophagitis, gastritis, duodenitis (bx: negative for H pylori).    Patient's wife is at bedside and provides most of the history. She states patient had an episode of dysphagia 10+ years ago in [place], which was attributed to an allergic reaction and he was treated with steroids/Benadryl with resolution.    She states during patient was fine from a swallowing standpoint until mid-December.  He has had weight loss of approximately 10 lbs in 1 month.  She believes his dysphagia is related to a reaction, possibly Robitussin, and anti-inflammatory treatment helped.  She states during his last admission "he passed the swallowing test," which she attributes to Benadryl.  Per chart review, SLP recommended dysphagia ground diet and nectar thick liquids.  CT of the head and neck revealed a large osteophyte at C5-6 causing mass effect on the esophagus.   Patient reports dysphagia and odynophagia. Denies nausea or vomiting but has been spitting up secretions.  Denies abdominal pain, melena or hematochezia.  Experiences intermittent constipation.  Patient has been on Heparin during admission, no outpatient anticoagulation.  Past Medical History:  Diagnosis Date  . Chicken pox   . Frequent headaches   . Glaucoma   . Hay fever   . History of blood transfusion   . HTN (hypertension)   . Hyperlipidemia   . Hyperlipidemia   . Migraines    . 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[facility])     Past Surgical History:  Procedure Laterality Date  . CHOLECYSTECTOMY, LAPAROSCOPIC    . TONSILLECTOMY  1942  . TOTAL HIP ARTHROPLASTY Left 03/08/2020   Procedure: TOTAL HIP ARTHROPLASTY;  Surgeon: Marybelle Killings, MD;  Location: [facility] ORS;  Service: Orthopedics;  Laterality: Left;    Prior to Admission medications   Medication Sig Start Date End Date Taking? Authorizing Provider  acetaminophen (TYLENOL) 325 MG tablet Take 2 tablets (650 mg total) by mouth every 6 (six) hours as needed for mild pain (or Fever >/= 101). Patient taking differently: Take 650 mg by mouth every 6 (six) hours as needed for mild pain or fever (or Fever >/= 101). 03/13/20  Yes Allie Bossier, MD  alum & mag hydroxide-simeth (MAALOX/MYLANTA) 200-200-20 MG/5ML suspension Take 15 mLs by mouth every 4 (four) hours as needed for indigestion or heartburn. 03/13/20  Yes Allie Bossier, MD  amLODipine (NORVASC) 5 MG tablet Take 1 tablet (5 mg total) by mouth daily. 03/14/20  Yes Allie Bossier, MD  aspirin 325 MG EC tablet Take 1 tablet (325 mg total) by mouth daily with breakfast. MUST TAKE AT LEAST 4 WEEKS POSTOP FOR DVT PROPHYLAXIS Patient taking differently: Take 325 mg by mouth daily with breakfast. 03/11/20  Yes Lanae Crumbly, PA-C  atorvastatin (LIPITOR) 40 MG tablet Take 1 tablet (40 mg total) by mouth daily. 03/14/20  Yes Allie Bossier, MD  carboxymethylcellulose (REFRESH PLUS) 0.5 % SOLN Place 1 drop into both eyes in the morning, at noon,  and at bedtime.   Yes [provider]  HYDROcodone-acetaminophen (NORCO) 10-325 MG tablet Take 1 tablet by mouth every 6 (six) hours as needed. Patient taking differently: Take 1 tablet by mouth every 6 (six) hours as needed for moderate pain. 03/10/20  Yes Lanae Crumbly, PA-C  insulin glargine (LANTUS) 100 UNIT/ML Solostar Pen Inject 5 Units into the skin daily. 03/13/20  Yes Allie Bossier, MD  latanoprost (XALATAN)  0.005 % ophthalmic solution Place 1 drop into both eyes at bedtime.  03/13/19  Yes [provider]  metoCLOPramide (REGLAN) 5 MG tablet Take 1-2 tablets (5-10 mg total) by mouth every 8 (eight) hours as needed for nausea (if ondansetron (ZOFRAN) ineffective.). 03/13/20  Yes Allie Bossier, MD  pantoprazole (PROTONIX) 40 MG tablet Take 40 mg by mouth daily.   Yes [provider]  Polyethyl Glycol-Propyl Glycol (SYSTANE) 0.4-0.3 % GEL ophthalmic gel Place 1 application into both eyes in the morning and at bedtime.   Yes [provider]  senna-docusate (SENOKOT-S) 8.6-50 MG tablet Take 1 tablet by mouth 2 (two) times daily as needed for mild constipation. 03/13/20  Yes Allie Bossier, MD  SIMBRINZA 1-0.2 % SUSP Place 1 drop into both eyes in the morning and at bedtime.  05/16/19  Yes [provider]  tamsulosin (FLOMAX) 0.4 MG CAPS capsule Take 1 capsule (0.4 mg total) by mouth daily after supper. Patient taking differently: Take 0.4 mg by mouth daily. 03/13/20  Yes Allie Bossier, MD  docusate sodium (COLACE) 100 MG capsule Take 1 capsule (100 mg total) by mouth 2 (two) times daily. Patient not taking: Reported on 03/26/2020 03/13/20   Allie Bossier, MD  guaiFENesin-dextromethorphan [facility] DM) 100-10 MG/5ML syrup Take 5 mLs by mouth every 4 (four) hours as needed for cough. Patient taking differently: Take 10 mLs by mouth every 8 (eight) hours as needed for cough. 03/13/20   Allie Bossier, MD  Insulin Pen Needle 29G X 12.7MM MISC Use as directed 03/13/20   Allie Bossier, MD    Scheduled Meds: . alum & mag hydroxide-simeth  30 mL Oral Once   And  . lidocaine  15 mL Oral Once  . Chlorhexidine Gluconate Cloth  6 each Topical Daily  . heparin  5,000 Units Subcutaneous Q8H  . insulin aspart  0-5 Units Subcutaneous QHS  . insulin aspart  0-9 Units Subcutaneous TID WC  . latanoprost  1 drop Both Eyes QHS  . pantoprazole (PROTONIX) IV  40 mg Intravenous  Q24H  . polyvinyl alcohol  1 drop Both Eyes TID   Continuous Infusions: . dextrose 5 % and 0.45% NaCl 75 mL/hr at 03/28/20 0758   PRN Meds:.acetaminophen **OR** acetaminophen, morphine injection  Allergies as of 03/26/2020 - Review Complete 03/26/2020  Allergen Reaction Noted  . Robitussin cold cough+ chest [dextromethorphan-guaifenesin] Swelling 03/26/2020    Family History  Problem Relation Age of Onset  . Liver disease Mother   . Skin cancer Father   . Skin cancer Paternal Grandfather   . Diabetes Maternal Grandfather     Social History   Socioeconomic History  . Marital status: Married    Spouse name: Not on file  . Number of children: Not on file  . Years of education: Not on file  . Highest education level: Not on file  Occupational History  . Not on file  Tobacco Use  . Smoking status: Former Smoker    Start date: 04/01/1968    Quit  date: 01/11/1977    Years since quitting: 43.2  . Smokeless tobacco: Never Used  Vaping Use  . Vaping Use: Never used  Substance and Sexual Activity  . Alcohol use: No  . Drug use: No  . Sexual activity: Never  Other Topics Concern  . Not on file  Social History Narrative  . Not on file   Social Determinants of Health   Financial Resource Strain: Not on file  Food Insecurity: Not on file  Transportation Needs: Not on file  Physical Activity: Not on file  Stress: Not on file  Social Connections: Not on file  Intimate Partner Violence: Not on file    Review of Systems: Review of Systems  Constitutional: Positive for malaise/fatigue and weight loss.  HENT: Positive for hearing loss. Negative for tinnitus.   Eyes: Negative for pain and redness.  Respiratory: Negative for cough and shortness of breath.   Cardiovascular: Negative for chest pain and palpitations.  Gastrointestinal: Positive for constipation. Negative for abdominal pain, blood in stool, diarrhea, heartburn, melena, nausea and vomiting.  Genitourinary:  Negative for flank pain and hematuria.  Musculoskeletal: Positive for falls. Negative for myalgias.  Skin: Negative for itching and rash.  Neurological: Negative for seizures and loss of consciousness.  Endo/Heme/Allergies: Negative for polydipsia. Does not bruise/bleed easily.  Psychiatric/Behavioral: Negative for substance abuse. The patient is not nervous/anxious.      Physical Exam: Vital signs: Vitals:   03/27/20 2121 03/28/20 0621  BP: (!) 156/66 (!) 154/72  Pulse:  84  Resp:  18  Temp:  (!) 97.3 F (36.3 C)  SpO2:  98%   Last BM Date:  (Doesnt remember) Physical Exam Vitals reviewed.  Constitutional:      General: He is not in acute distress.    Appearance: He is underweight. He is ill-appearing (chronically).  HENT:     Head: Normocephalic and atraumatic.     Nose: Nose normal. No congestion.     Mouth/Throat:     Mouth: Mucous membranes are moist.     Pharynx: Oropharynx is clear.  Eyes:     General: No scleral icterus.    Extraocular Movements: Extraocular movements intact.     Conjunctiva/sclera: Conjunctivae normal.  Cardiovascular:     Rate and Rhythm: Normal rate and regular rhythm.     Pulses: Normal pulses.     Heart sounds: Murmur heard.    Pulmonary:     Effort: Pulmonary effort is normal. No respiratory distress.     Breath sounds: Normal breath sounds.  Abdominal:     General: Bowel sounds are normal. There is no distension.     Palpations: Abdomen is soft. There is no mass.     Tenderness: There is no abdominal tenderness. There is no guarding or rebound.     Hernia: No hernia is present.  Musculoskeletal:        General: No deformity.     Cervical back: Normal range of motion and neck supple.     Right lower leg: No edema.     Left lower leg: No edema.  Skin:    General: Skin is warm and dry.  Neurological:     General: No focal deficit present.     Mental Status: He is oriented to person, place, and time. Mental status is at baseline.  He is lethargic.  Psychiatric:        Mood and Affect: Mood normal.        Behavior: Behavior normal. Behavior is  cooperative.     GI:  Lab Results: Recent Labs    03/26/20 1833 03/27/20 0415 03/28/20 0546  WBC 9.5 8.5 6.0  HGB 9.2* 8.3* 8.8*  HCT 30.4* 27.0* 28.6*  PLT 383 307 290   BMET Recent Labs    03/26/20 1049 03/26/20 1833 03/27/20 0415 03/28/20 0546  NA 146*  --  149* 150*  K 3.8  --  3.5 3.2*  CL 112*  --  117* 118*  CO2 21*  --  20* 21*  GLUCOSE 86  --  78 99  BUN 33*  --  27* 27*  CREATININE 1.90* 1.70* 1.66* 1.58*  CALCIUM 8.2*  --  7.7* 7.7*   LFT Recent Labs    03/26/20 1049 03/27/20 0415  PROT 6.7 5.8*  ALBUMIN 2.8* 2.5*  AST 28 17  ALT 31 21  ALKPHOS 83 68  BILITOT 0.9 0.7  BILIDIR 0.1  --   IBILI 0.8  --    PT/INR No results for input(s): LABPROT, INR in the last 72 hours.   Studies/Results: No results found.  Impression: Dysphagia: likely multifactorial.  Osteophyte with esophageal compression, likely poor esophageal motility in the setting of debility, and esophagitis. -EGD 03/17/20: mild esophagitis, gastritis, duodenitis (bx: negative for H pylori)  Normocytic anemia: Hgb 8.8, stable.  Low iron and saturation, as well as low TIBC. Normal ferritin.  Possibly anemia of chronic disease. No overt blood loss.    Poor oral intake, debility  CKD   Plan: Increase Protonix to 40 mg IV BID.  Speech evaluation and swallowing study, diet per their recommendations.  If liquids OK, recommend initiation of Carafate for esophagitis and odynophagia.  Consider barium swallow pending speech evaluation.  Calorie count and nutrition consultation.  Consider G-tube if inadequate PO intake.  [facility] will follow.    LOS: 2 days   Salley Slaughter  PA-C 03/28/2020, 11:56 AM  Contact #  [phone]

## 2020-03-28 NOTE — Evaluation (Signed)
Occupational Therapy Evaluation Patient Details Name: John Walton MRN: [id number] DOB: 1932-05-21 Today's Date: 03/28/2020    History of Present Illness 86 year old male with history of hypertension, hyperlipidemia, dysphagia who presented from [facility] skilled nursing facility for the evaluation of dysphagia, fever.   Clinical Impression   Pt admitted with the above diagnoses and presents with below problem list. Pt will benefit from continued acute OT to address the below listed deficits and maximize independence with basic ADLs prior to d/c back to SNF for completion of rehab. At baseline, pt is mod I to independent with basic ADLs. Pt presents today with significant generalized weakness and decreased activity tolerance. Bed level OT eval as pt declined coming to EOB "I haven't been able to eat in a month." Pt is currently max-total A with ADLs. Spouse present throughout session.      Follow Up Recommendations  SNF    Equipment Recommendations  Other (comment) (defer to next venue)    Recommendations for Other Services       Precautions / Restrictions Precautions Precautions: Fall;Posterior Hip Precaution Comments: pt does not recall THP Required Braces or Orthoses: Knee Immobilizer - Left Restrictions Weight Bearing Restrictions: Yes LLE Weight Bearing: Weight bearing as tolerated      Mobility Bed Mobility               General bed mobility comments: pt declined coming to EOB. Able to raise each leg against gravity in supine. Very weak overall.    Transfers                 General transfer comment: pt declined, OOB today    Balance                                           ADL either performed or assessed with clinical judgement   ADL Overall ADL's : Needs assistance/impaired Eating/Feeding: NPO   Grooming: Maximal assistance;Bed level   Upper Body Bathing: Maximal assistance;Bed level   Lower Body Bathing: Total  assistance;Bed level   Upper Body Dressing : Maximal assistance;Bed level   Lower Body Dressing: Total assistance;Bed level                 General ADL Comments: bed level eval as pt declined EOB 2/2 significant fatigue/lethargy "I haven't eaten in a month." Completed 2x sets 5 reps R leg raises in supine. 1 set 10 reps L leg raises. Able to raise BUE against gravity to touch the top of his head. Significant weakness and decreased activity tolerance     Vision         Perception     Praxis      Pertinent Vitals/Pain Pain Assessment: Faces Faces Pain Scale: Hurts little more Pain Location: L hip with movement Pain Descriptors / Indicators: Aching;Sore Pain Intervention(s): Monitored during session;Limited activity within patient's tolerance     Hand Dominance     Extremity/Trunk Assessment Upper Extremity Assessment Upper Extremity Assessment: Generalized weakness   Lower Extremity Assessment Lower Extremity Assessment: Defer to PT evaluation       Communication Communication Communication: No difficulties   Cognition Arousal/Alertness: Awake/alert (eyes closed unless in conversation) Behavior During Therapy: Flat affect Overall Cognitive Status: Within Functional Limits for tasks assessed  General Comments: cognition WFL for tasks assessed   General Comments  Pt's spouse present and provided recap of recent history.    Exercises     Shoulder Instructions      Home Living Family/patient expects to be discharged to:: Skilled nursing facility Living Arrangements: Spouse/significant other                                      Prior Functioning/Environment Level of Independence: Independent        Comments: IND prior to hip fx. pt states he has been doing some exercises but very little amb at SNF        OT Problem List: Decreased strength;Decreased activity tolerance;Impaired balance  (sitting and/or standing);Decreased knowledge of use of DME or AE;Decreased knowledge of precautions;Pain      OT Treatment/Interventions: Self-care/ADL training;Therapeutic exercise;Energy conservation;DME and/or AE instruction;Therapeutic activities;Patient/family education;Balance training    OT Goals(Current goals can be found in the care plan section) Acute Rehab OT Goals Patient Stated Goal: Regain IND, be able to swallow OT Goal Formulation: With patient/family Time For Goal Achievement: 04/11/20 Potential to Achieve Goals: Fair ADL Goals Pt Will Perform Grooming: with min assist;bed level Pt Will Transfer to Toilet: with mod assist;with +2 assist;bedside commode;stand pivot transfer Pt/caregiver will Perform Home Exercise Program: Both right and left upper extremity;With written HEP provided;Increased strength;With Supervision Additional ADL Goal #1: Pt will complete bed mobiltiy at min A level to prepare for EOB/OOB ADLs.  OT Frequency: Min 2X/week   Barriers to D/C:            Co-evaluation              AM-PAC OT "6 Clicks" Daily Activity     Outcome Measure Help from another person eating meals?: Total (NPO) Help from another person taking care of personal grooming?: A Lot Help from another person toileting, which includes using toliet, bedpan, or urinal?: Total Help from another person bathing (including washing, rinsing, drying)?: Total Help from another person to put on and taking off regular upper body clothing?: Total Help from another person to put on and taking off regular lower body clothing?: Total 6 Click Score: 7   End of Session    Activity Tolerance: Patient limited by lethargy Patient left: in bed;with call bell/phone within reach;Other (comment) (bed level eval)  OT Visit Diagnosis: Unsteadiness on feet (R26.81);Muscle weakness (generalized) (M62.81);Pain;History of falling (Z91.81)                Time: 1213-1228 OT Time Calculation (min): 15  min Charges:  OT General Charges $OT Visit: 1 Visit OT Evaluation $OT Eval Moderate Complexity: [place], OT Acute Rehabilitation Services Pager: [phone] Office: [phone]   Hortencia Pilar 03/28/2020, 1:32 PM

## 2020-03-29 ENCOUNTER — Inpatient Hospital Stay (HOSPITAL_COMMUNITY): Payer: No Typology Code available for payment source

## 2020-03-29 DIAGNOSIS — E119 Type 2 diabetes mellitus without complications: Secondary | ICD-10-CM

## 2020-03-29 DIAGNOSIS — R531 Weakness: Secondary | ICD-10-CM | POA: Diagnosis present

## 2020-03-29 DIAGNOSIS — E870 Hyperosmolality and hypernatremia: Secondary | ICD-10-CM | POA: Diagnosis present

## 2020-03-29 DIAGNOSIS — N184 Chronic kidney disease, stage 4 (severe): Secondary | ICD-10-CM | POA: Diagnosis not present

## 2020-03-29 DIAGNOSIS — I10 Essential (primary) hypertension: Secondary | ICD-10-CM

## 2020-03-29 DIAGNOSIS — D649 Anemia, unspecified: Secondary | ICD-10-CM

## 2020-03-29 DIAGNOSIS — E876 Hypokalemia: Secondary | ICD-10-CM | POA: Diagnosis present

## 2020-03-29 DIAGNOSIS — E43 Unspecified severe protein-calorie malnutrition: Secondary | ICD-10-CM | POA: Diagnosis present

## 2020-03-29 DIAGNOSIS — R509 Fever, unspecified: Secondary | ICD-10-CM | POA: Diagnosis present

## 2020-03-29 DIAGNOSIS — R1310 Dysphagia, unspecified: Secondary | ICD-10-CM | POA: Diagnosis not present

## 2020-03-29 DIAGNOSIS — R5381 Other malaise: Secondary | ICD-10-CM | POA: Diagnosis not present

## 2020-03-29 DIAGNOSIS — E7801 Familial hypercholesterolemia: Secondary | ICD-10-CM

## 2020-03-29 HISTORY — PX: IR GASTROSTOMY TUBE MOD SED: IMG625

## 2020-03-29 LAB — CBC WITH DIFFERENTIAL/PLATELET
Abs Immature Granulocytes: 0.07 10*3/uL (ref 0.00–0.07)
Basophils Absolute: 0 10*3/uL (ref 0.0–0.1)
Basophils Relative: 0 %
Eosinophils Absolute: 0.2 10*3/uL (ref 0.0–0.5)
Eosinophils Relative: 3 %
HCT: 29 % — ABNORMAL LOW (ref 39.0–52.0)
Hemoglobin: 9.1 g/dL — ABNORMAL LOW (ref 13.0–17.0)
Immature Granulocytes: 1 %
Lymphocytes Relative: 6 %
Lymphs Abs: 0.4 10*3/uL — ABNORMAL LOW (ref 0.7–4.0)
MCH: 28.5 pg (ref 26.0–34.0)
MCHC: 31.4 g/dL (ref 30.0–36.0)
MCV: 90.9 fL (ref 80.0–100.0)
Monocytes Absolute: 0.9 10*3/uL (ref 0.1–1.0)
Monocytes Relative: 14 %
Neutro Abs: 5.3 10*3/uL (ref 1.7–7.7)
Neutrophils Relative %: 76 %
Platelets: 312 10*3/uL (ref 150–400)
RBC: 3.19 MIL/uL — ABNORMAL LOW (ref 4.22–5.81)
RDW: 13.4 % (ref 11.5–15.5)
WBC: 6.9 10*3/uL (ref 4.0–10.5)
nRBC: 0 % (ref 0.0–0.2)

## 2020-03-29 LAB — BASIC METABOLIC PANEL
Anion gap: 10 (ref 5–15)
BUN: 20 mg/dL (ref 8–23)
CO2: 22 mmol/L (ref 22–32)
Calcium: 7.6 mg/dL — ABNORMAL LOW (ref 8.9–10.3)
Chloride: 114 mmol/L — ABNORMAL HIGH (ref 98–111)
Creatinine, Ser: 1.39 mg/dL — ABNORMAL HIGH (ref 0.61–1.24)
GFR, Estimated: 50 mL/min — ABNORMAL LOW (ref >60)
Glucose, Bld: 161 mg/dL — ABNORMAL HIGH (ref 70–99)
Potassium: 3 mmol/L — ABNORMAL LOW (ref 3.5–5.1)
Sodium: 146 mmol/L — ABNORMAL HIGH (ref 135–145)

## 2020-03-29 LAB — CULTURE, BLOOD (ROUTINE X 2): Special Requests: ADEQUATE

## 2020-03-29 LAB — MAGNESIUM: Magnesium: 1.9 mg/dL (ref 1.7–2.4)

## 2020-03-29 LAB — GLUCOSE, CAPILLARY
Glucose-Capillary: 153 mg/dL — ABNORMAL HIGH (ref 70–99)
Glucose-Capillary: 178 mg/dL — ABNORMAL HIGH (ref 70–99)
Glucose-Capillary: 106 mg/dL — ABNORMAL HIGH (ref 70–99)
Glucose-Capillary: 86 mg/dL (ref 70–99)

## 2020-03-29 MED ORDER — POTASSIUM CHLORIDE 10 MEQ/100ML IV SOLN
10.0000 meq | Freq: Once | INTRAVENOUS | Status: AC
  Administered 2020-03-29: 10 meq via INTRAVENOUS
  Filled 2020-03-29: qty 100

## 2020-03-29 MED ORDER — MIDAZOLAM HCL 2 MG/2ML IJ SOLN
INTRAMUSCULAR | Status: AC
  Filled 2020-03-29: qty 2

## 2020-03-29 MED ORDER — LIDOCAINE HCL (PF) 1 % IJ SOLN
INTRAMUSCULAR | Status: AC | PRN
  Administered 2020-03-29: 10 mL via INTRADERMAL

## 2020-03-29 MED ORDER — OSMOLITE 1.5 CAL PO LIQD
1000.0000 mL | ORAL | Status: DC
  Administered 2020-03-30 – 2020-04-03 (×6): 1000 mL
  Filled 2020-03-29 (×7): qty 1000

## 2020-03-29 MED ORDER — CEFAZOLIN SODIUM-DEXTROSE 2-4 GM/100ML-% IV SOLN: 2.0000 g | Freq: Once | INTRAVENOUS | Status: AC

## 2020-03-29 MED ORDER — FENTANYL CITRATE (PF) 100 MCG/2ML IJ SOLN
INTRAMUSCULAR | Status: AC | PRN
  Administered 2020-03-29 (×2): 25 ug via INTRAVENOUS

## 2020-03-29 MED ORDER — IOHEXOL 300 MG/ML  SOLN
50.0000 mL | Freq: Once | INTRAMUSCULAR | Status: AC | PRN
  Administered 2020-03-29: 15 mL

## 2020-03-29 MED ORDER — CEFAZOLIN SODIUM-DEXTROSE 2-4 GM/100ML-% IV SOLN
INTRAVENOUS | Status: AC
  Administered 2020-03-29: 2000 mg via INTRAVENOUS
  Filled 2020-03-29: qty 100

## 2020-03-29 MED ORDER — GLUCAGON HCL (RDNA) 1 MG IJ SOLR
INTRAMUSCULAR | Status: AC | PRN
  Administered 2020-03-29: 1 mg via INTRAVENOUS

## 2020-03-29 MED ORDER — GLUCAGON HCL RDNA (DIAGNOSTIC) 1 MG IJ SOLR
INTRAMUSCULAR | Status: AC
  Filled 2020-03-29: qty 1

## 2020-03-29 MED ORDER — MIDAZOLAM HCL 2 MG/2ML IJ SOLN
INTRAMUSCULAR | Status: AC | PRN
  Administered 2020-03-29 (×3): 0.5 mg via INTRAVENOUS

## 2020-03-29 MED ORDER — FENTANYL CITRATE (PF) 100 MCG/2ML IJ SOLN
INTRAMUSCULAR | Status: AC
  Filled 2020-03-29: qty 2

## 2020-03-29 MED ORDER — POTASSIUM CHLORIDE 20 MEQ PO PACK
40.0000 meq | PACK | ORAL | Status: DC
  Filled 2020-03-29 (×2): qty 2

## 2020-03-29 MED ORDER — LIDOCAINE HCL 1 % IJ SOLN
INTRAMUSCULAR | Status: AC
  Filled 2020-03-29: qty 20

## 2020-03-29 MED ORDER — POTASSIUM CHLORIDE 10 MEQ/100ML IV SOLN
10.0000 meq | INTRAVENOUS | Status: AC
  Administered 2020-03-29 (×3): 10 meq via INTRAVENOUS
  Filled 2020-03-29 (×3): qty 100

## 2020-03-29 NOTE — Progress Notes (Signed)
Interventional radiology was able to place gastrostomy tube today.  Case discussed with Dr. Grandville Silos and with patient's wife, explaining the benefit of proceeding with this expeditiously.    We can pursue testing of his swallowing function (modified barium swallow, speech therapy consultation, barium swallow) plus calorie counts electively during the remainder of his hospitalization.  In the meantime, he will be getting the nutritional support he desperately needs.  Cleotis Nipper, M.D. Pager [phone] If no answer or after 5 PM call [phone]

## 2020-03-29 NOTE — Progress Notes (Signed)
MEDICATION-RELATED CONSULT NOTE   IR Procedure Consult - Anticoagulant/Antiplatelet PTA/Inpatient Med List Review by Pharmacist    Procedure: Placement of percutaneous 59F pull-through gastrostomy tube.    Completed: 12/29 at 12:47  Post-Procedural bleeding risk per IR MD assessment:   - Standard   Antithrombotic medications on inpatient or PTA profile prior to procedure:    - Prophylactic SQ heparin     Recommended restart time per IR Post-Procedure Guidelines:   Day 0  (at least 6 hours or at next standard dose interval)      Other considerations:      Plan:    Hold 1400 SQ heparin dose Resume heparin 5000 units Q8h at [place], PharmD, BCPS 03/29/2020 1:21 PM

## 2020-03-29 NOTE — Procedures (Signed)
Interventional Radiology Procedure Note  Procedure: Placement of percutaneous 20F pull-through gastrostomy tube. Complications: None Recommendations: - NPO except for sips and chips remainder of today and overnight - Maintain G-tube to LWS until tomorrow morning  - May advance diet as tolerated and begin using tube tomorrow morning  Signed,  Fernandez Kenley K. Valeriano Bain, MD   

## 2020-03-29 NOTE — Progress Notes (Signed)
Physical Therapy Treatment Patient Details Name: John Walton MRN: [id number] DOB: 12/06/1932 Today's Date: 03/29/2020    History of Present Illness 86 year old male with history of hypertension, hyperlipidemia, dysphagia who presented from [facility] skilled nursing facility for the evaluation of dysphagia, fever.    PT Comments    Called back into room by pt, requesting to have a BM. General transfer comment: required + 2 assist to rise from recliner and pivot 1/4 turn to [facility]  Extended time on BSC attempting BM.  Pt stated her was "too weak to push". Unsteady.  Max c/o weakness.  Required + 2 assist to rise from [facility] perform peri care then back to bed.  Follow Up Recommendations  SNF     Equipment Recommendations  None recommended by PT    Recommendations for Other Services       Precautions / Restrictions Precautions Precautions: Fall;Posterior Hip Precaution Comments: pt does not recall THP Restrictions Weight Bearing Restrictions: Yes LLE Weight Bearing: Weight bearing as tolerated    Mobility  Bed Mobility Overal bed mobility: Needs Assistance Bed Mobility: Sit to Supine     Sit to supine: Max assist   General bed mobility comments: required increased assist back to bed to support b LE and scoot to [facility]  Transfers Overall transfer level: Needs assistance Equipment used: Bilateral platform walker (EVA walker) Transfers: Sit to/from [facility] Sit to Stand: Mod assist;+2 physical assistance;+2 safety/equipment;From elevated surface;Max assist Stand pivot transfers: Max assist;+2 physical assistance;+2 safety/equipment       General transfer comment: required + 2 assist to rise from recliner and pivot 1/4 turn to [facility].  Unsteady.  Max c/o weakness.  Required + 2 assist to rise from [facility] then back to bed.  Ambulation/Gait   General Gait Details: transfers fromj recliner to [facility] then to bed during this session.   Stairs              Wheelchair Mobility    Modified Rankin (Stroke Patients Only)       Balance                                            Cognition Arousal/Alertness: Awake/alert Behavior During Therapy: WFL for tasks assessed/performed Overall Cognitive Status: Within Functional Limits for tasks assessed                                 General Comments: AxO x 3 pleasant      Exercises      General Comments        Pertinent Vitals/Pain Pain Assessment: No/denies pain Faces Pain Scale: Hurts a little bit Pain Location: L hip with movement Pain Descriptors / Indicators: Aching;Sore Pain Intervention(s): Monitored during session;Repositioned    Home Living                      Prior Function            PT Goals (current goals can now be found in the care plan section) Progress towards PT goals: Progressing toward goals    Frequency    Min 3X/week      PT Plan Current plan remains appropriate    Co-evaluation              AM-PAC PT "6 Clicks"  Mobility   Outcome Measure  Help needed turning from your back to your side while in a flat bed without using bedrails?: A Lot Help needed moving from lying on your back to sitting on the side of a flat bed without using bedrails?: A Lot Help needed moving to and from a bed to a chair (including a wheelchair)?: A Lot Help needed standing up from a chair using your arms (e.g., wheelchair or bedside chair)?: A Lot Help needed to walk in hospital room?: A Lot Help needed climbing 3-5 steps with a railing? : Total 6 Click Score: 11    End of Session Equipment Utilized During Treatment: Gait belt Activity Tolerance: Patient limited by fatigue Patient left: in bed Nurse Communication: Mobility status PT Visit Diagnosis: Difficulty in walking, not elsewhere classified (R26.2);History of falling (Z91.81);Unsteadiness on feet (R26.81) Pain - Right/Left: Left Pain - part of body: Hip      Time: 8421-0312 PT Time Calculation (min) (ACUTE ONLY): 25 min  Charges:  $Therapeutic Activity: 23-37 mins                     Rica Koyanagi  PTA Acute  Rehabilitation Services Pager      [phone] Office      [phone]

## 2020-03-29 NOTE — Progress Notes (Signed)
Physical Therapy Treatment Patient Details Name: John Walton MRN: [id number] DOB: [birth year] Today's Date: 03/29/2020    History of Present Illness 86 year old male with history of hypertension, hyperlipidemia, dysphagia who presented from [facility] skilled nursing facility for the evaluation of dysphagia, fever.    PT Comments    Pt is a re admit due to severe dysphagia recently here at [facility] for L THR on 03/08/20 by Dr Lorin Mercy.  Pt was D/C to [facility] for [facility]. Assisted OOB to amb required + 2 assist.  General bed mobility comments: required Max Assist with upper body and MOD Assist with LE as well as use of bed pad to complete scooting to EOB. General transfer comment: required + 2 side by side assist from elevated surface and 50% VC's to avoid hip flex > 90 degrees and proper hand placement to rise  General Gait Details: Used B Platform EVA walker this session for in creased support and to promote increased upright stance time as well as amb distance as pt has not amb > week due to illness.  Third assist needed to follow with recliner. Performed a few TE's then positioned in recliner to comfort.    Follow Up Recommendations  SNF     Equipment Recommendations  None recommended by PT    Recommendations for Other Services       Precautions / Restrictions Precautions Precautions: Fall;Posterior Hip Precaution Comments: pt does not recall THP Restrictions Weight Bearing Restrictions: Yes LLE Weight Bearing: Weight bearing as tolerated    Mobility  Bed Mobility Overal bed mobility: Needs Assistance Bed Mobility: Supine to Sit     Supine to sit: Max assist     General bed mobility comments: required Max Assist with upper body and MOD Assist with LE as well as use of bed pad to complete scooting to EOB.  Transfers Overall transfer level: Needs assistance Equipment used: Bilateral platform walker (EVA walker) Transfers: Sit to/from [employer] Transfers Sit to  Stand: Mod assist;+2 physical assistance;+2 safety/equipment;From elevated surface;Max assist Stand pivot transfers: Max assist;+2 physical assistance;+2 safety/equipment       General transfer comment: required + 2 side by side assist from elevated surface and 50% VC's to avoid hip flex > 90 degrees and proper hand placement to rise  Ambulation/Gait Ambulation/Gait assistance: Max assist;+2 physical assistance;+2 safety/equipment Gait Distance (Feet): 8 Feet Assistive device: Bilateral platform walker (EVA walker)   Gait velocity: decreased   General Gait Details: Used B Platform EVA walker this session for in creased support and to promote increased upright stance time as well as amb distance as pt has not amb > week due to illness.  Third assist needed to follow with recliner.   Stairs             Wheelchair Mobility    Modified Rankin (Stroke Patients Only)       Balance                                            Cognition Arousal/Alertness: Awake/alert Behavior During Therapy: WFL for tasks assessed/performed Overall Cognitive Status: Within Functional Limits for tasks assessed                                 General Comments: AxO x 3 pleasant  Exercises  15 reps LAQ's 15 reps AP, knee presses, HS    General Comments        Pertinent Vitals/Pain Pain Assessment: No/denies pain Faces Pain Scale: Hurts a little bit Pain Location: L hip with movement Pain Descriptors / Indicators: Aching;Sore Pain Intervention(s): Monitored during session;Repositioned    Home Living                      Prior Function            PT Goals (current goals can now be found in the care plan section) Progress towards PT goals: Progressing toward goals    Frequency    Min 3X/week      PT Plan Current plan remains appropriate    Co-evaluation              AM-PAC PT "6 Clicks" Mobility   Outcome Measure   Help needed turning from your back to your side while in a flat bed without using bedrails?: A Lot Help needed moving from lying on your back to sitting on the side of a flat bed without using bedrails?: A Lot Help needed moving to and from a bed to a chair (including a wheelchair)?: A Lot Help needed standing up from a chair using your arms (e.g., wheelchair or bedside chair)?: A Lot Help needed to walk in hospital room?: A Lot Help needed climbing 3-5 steps with a railing? : Total 6 Click Score: 11    End of Session Equipment Utilized During Treatment: Gait belt Activity Tolerance: Patient limited by fatigue Patient left: in chair;with chair alarm set;with call bell/phone within reach Nurse Communication: Mobility status PT Visit Diagnosis: Difficulty in walking, not elsewhere classified (R26.2);History of falling (Z91.81);Unsteadiness on feet (R26.81) Pain - Right/Left: Left Pain - part of body: Hip     Time: 1937-9024 PT Time Calculation (min) (ACUTE ONLY): 24 min  Charges:  $Therapeutic Activity: 8-22 mins                     Rica Koyanagi  PTA Acute  Rehabilitation Services Pager      [phone] Office      [phone]

## 2020-03-29 NOTE — Progress Notes (Signed)
PROGRESS NOTE    ROBERTS BON  KXF:[id number] DOB: 1932/05/26 DOA: 03/26/2020 PCP: [name], MD   Chief Complaint  Patient presents with  . Dysphagia    Brief Narrative:  Patient is a 86 year old male with history of hypertension, hyperlipidemia, dysphagia who presented from [facility] facility for the evaluation of dysphagia, fever.  He recently had hip replacement and is currently residing in a skilled nursing facility.  He is mostly bedbound since last few weeks.  Most of the information was taken from his wife.  As per the wife, he had swallowing issues about 10 years ago which automatically resolved after he was giving some antiallergic medication/Benadryl.  He had this issue again few weeks ago and he was worked up extensively at [facility].  He underwent EGD, swallowing evaluation; the studies were largely unremarkable except for mild esophagitis.  He was then sent back to skilled nursing facility. GI/speech following here.  Patient and family are interested on PEG placement if he is found to have persistent dysphagia.    Assessment & Plan:   Active Problems:   Dysphagia   Protein-calorie malnutrition, severe  #1 dysphagia Patient noted to have some intermittent dysphagia over the past 10 years.  Questionable etiology.  Likely multifactorial and could be from osteophyte at C5-C6, poor esophageal motility, esophagitis.   Patient noted to have recently been admitted at [facility] and had extensive work-up there.  Speech evaluation, EGD.  Work-up largely negative except for some mild esophagitis, gastritis, duodenitis.  Speech therapy following and planning a modified barium swallow.  It was noted that patient and family were interested in PEG placement if patient was to have persistent dysphagia or if recommended by GI.  Patient placed on clear liquids however per RN not tolerating clear liquids and only ice chips and  hesitant for oral medications to be given.  Patient seen by GI who recommended PEG tube placement as they feel patient would not do well with a trial of p.o.'s and feel this will delay intervention which he most needs at this time.  Patient for PEG tube placement today.  Discussed with wife and in agreement.  Discussed with GI who discussed with wife and also in agreement.  GI following and appreciate input and recommendations.  2.  Chronic indwelling Foley catheter Patient was discharged to rehab with Foley catheter from [facility].  Patient recommended to follow-up with urology at [facility] on discharge.  Flomax started.  Continue Foley catheter with outpatient follow-up with urology.  3.  DISH CT head and neck was ordered to evaluate for other potential causes at [facility].  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  Same finding noted on imaging in [place] in 2011.  Spinal surgery was consulted at [facility] and concluded no safe way for removal of osteophyte, so nonsurgical management was recommended.  Outpatient follow-up.  4.  Report of fever Currently afebrile.  Aspiration pneumonia suspected by chest x-ray negative.  Currently on room air.  Afebrile.  Monitor for now.  5.  Hypernatremia Secondary to dehydration.  Sodium levels improving.  Continue hypotonic fluids.  Follow.  6.  Severe protein calorie malnutrition Secondary to problem #1.  Patient for PEG tube placement today.  Dietitian consulted to initiate tube feeds and following.  7.  Hyperlipidemia Noted to be on a statin at home prior to admission.  8.  Hypokalemia KCl 10 mEq IV every hour x4 rounds as patient hesitant for oral intake.  Check a magnesium level.  Follow and replete.  9.  Chronic kidney disease stage IV Noted to have been evaluated by nephrology at [facility].  Creatinine at baseline.  Outpatient follow-up with  nephrology, Dr.Igwemezie.  10.  Normocytic anemia/iron deficiency anemia Likely secondary to poor oral intake.  Patient with no overt bleeding.  Anemia panel consistent with low iron.  Status post IV iron.  Will likely need oral iron supplementation on discharge.  Follow H&H.  Transfusion threshold hemoglobin < 7.  Follow.  11.  Debility/deconditioning Patient recently hospitalized after mechanical fall with left hip fracture requiring surgery 03/08/2020 and discharged to SNF.  PT OT following and recommending SNF.  12.  Diabetes mellitus type 2 Hemoglobin A1c 6.7 (03/07/2020).  CBG 153 this morning.  Patient on D5 half-normal saline.  Continue sliding scale insulin.   DVT prophylaxis: Heparin on hold pending procedure. Code Status: Full Family Communication: Updated patient and wife at bedside. Disposition:   Status is: Inpatient    Dispo: The patient is from: Home              Anticipated d/c is to: SNF              Anticipated d/c date is: 3 to 4 days.              Patient currently to receive G-tube placement and initiation of tube feeds and further work-up of dysphagia pending.  Not stable for discharge.       Consultants:   Wound care Cottie Banda 03/27/2020  Gastroenterology: Dr. Cristina Gong 03/28/2020  Interventional radiology Dr. Laurence Ferrari 03/29/2020  Procedures:  Gastrostomy tube placement pending per IR 03/29/2020  Chest x-ray 03/26/2020  Antimicrobials:   None   Subjective: Patient sitting up in chair.  States he did a little bit better with ice chips.  Denies any chest pain.  No shortness of breath.  No abdominal pain.  Patient states he understands he needs to get a PEG tube for nutrition.  Awaiting modified barium swallow.  PT working with patient.  Objective: Vitals:   03/28/20 1334 03/28/20 2118 03/29/20 0426 03/29/20 0618  BP: 136/75 (!) 162/62 (!) 161/59 (!) 156/57  Pulse: (!) 106 80 78 74  Resp: 17 20 20    Temp: 98.8 F (37.1 C) 98.1 F  (36.7 C) 98.3 F (36.8 C)   TempSrc: Oral Oral Oral   SpO2: 96% 98% 99%   Weight:      Height:        Intake/Output Summary (Last 24 hours) at 03/29/2020 1109 Last data filed at 03/29/2020 1000 Gross per 24 hour  Intake 900 ml  Output 1200 ml  Net -300 ml   Filed Weights   03/26/20 1727  Weight: 61.2 kg    Examination:  General exam: Appears calm and comfortable.  Chronically ill-appearing. Respiratory system: Clear to auscultation. Respiratory effort normal. Cardiovascular system: S1 & S2 heard, RRR. No JVD, murmurs, rubs, gallops or clicks. No pedal edema. Gastrointestinal system: Abdomen is nondistended, soft and nontender. No organomegaly or masses felt. Normal bowel sounds heard. Central nervous system: Alert and oriented. No focal neurological deficits. Extremities: Symmetric 5 x 5 power. Skin: No rashes, lesions or ulcers Psychiatry: Judgement and insight appear normal. Mood & affect appropriate.     Data Reviewed: I have personally reviewed following labs and imaging studies  CBC: Recent Labs  Lab 03/26/20 1049 03/26/20 1833 03/27/20 0415 03/28/20 0546 03/29/20 0447  WBC 13.3* 9.5 8.5 6.0 6.9  NEUTROABS 11.8*  --   --  4.5 5.3  HGB 9.8* 9.2* 8.3* 8.8* 9.1*  HCT 31.2* 30.4* 27.0* 28.6* 29.0*  MCV 91.2 94.1 93.8 93.2 90.9  PLT 415* 383 307 290 322    Basic Metabolic Panel: Recent Labs  Lab 03/26/20 1049 03/26/20 1833 03/27/20 0415 03/28/20 0546 03/29/20 0447  NA 146*  --  149* 150* 146*  K 3.8  --  3.5 3.2* 3.0*  CL 112*  --  117* 118* 114*  CO2 21*  --  20* 21* 22  GLUCOSE 86  --  78 99 161*  BUN 33*  --  27* 27* 20  CREATININE 1.90* 1.70* 1.66* 1.58* 1.39*  CALCIUM 8.2*  --  7.7* 7.7* 7.6*  MG  --   --   --   --  1.9    GFR: Estimated Creatinine Clearance: 33.6 mL/min (A) (by C-G formula based on SCr of 1.39 mg/dL (H)).  Liver Function Tests: Recent Labs  Lab 03/26/20 1049 03/27/20 0415  AST 28 17  ALT 31 21  ALKPHOS 83 68   BILITOT 0.9 0.7  PROT 6.7 5.8*  ALBUMIN 2.8* 2.5*    CBG: Recent Labs  Lab 03/28/20 0732 03/28/20 1202 03/28/20 1643 03/28/20 2116 03/29/20 0746  GLUCAP 96 123* 118* 147* 153*     Recent Results (from the past 240 hour(s))  Resp Panel by RT-PCR (Flu A&B, Covid) Nasopharyngeal Swab     Status: None   Collection Time: 03/26/20 10:49 AM   Specimen: Nasopharyngeal Swab; Nasopharyngeal(NP) swabs in vial transport medium  Result Value Ref Range Status   SARS Coronavirus 2 by RT PCR NEGATIVE NEGATIVE Final    Comment: (NOTE) SARS-CoV-2 target nucleic acids are NOT DETECTED.  The SARS-CoV-2 RNA is generally detectable in upper respiratory specimens during the acute phase of infection. The lowest concentration of SARS-CoV-2 viral copies this assay can detect is 138 copies/mL. A negative result does not preclude SARS-Cov-2 infection and should not be used as the sole basis for treatment or other patient management decisions. A negative result may occur with  improper specimen collection/handling, submission of specimen other than nasopharyngeal swab, presence of viral mutation(s) within the areas targeted by this assay, and inadequate number of viral copies(<138 copies/mL). A negative result must be combined with clinical observations, patient history, and epidemiological information. The expected result is Negative.  Fact Sheet for Patients:  EntrepreneurPulse.com.au  Fact Sheet for Healthcare Providers:  IncredibleEmployment.be  This test is no t yet approved or cleared by the [place] FDA and  has been authorized for detection and/or diagnosis of SARS-CoV-2 by FDA under an Emergency Use Authorization (EUA). This EUA will remain  in effect (meaning this test can be used) for the duration of the COVID-19 declaration under Section 564(b)(1) of the Act, 21 U.S.C.section 360bbb-3(b)(1), unless the authorization is terminated  or revoked  sooner.       Influenza A by PCR NEGATIVE NEGATIVE Final   Influenza B by PCR NEGATIVE NEGATIVE Final    Comment: (NOTE) The Xpert Xpress SARS-CoV-2/FLU/RSV plus assay is intended as an aid in the diagnosis of influenza from Nasopharyngeal swab specimens and should not be used as a sole basis for treatment. Nasal washings and aspirates are unacceptable for Xpert Xpress SARS-CoV-2/FLU/RSV testing.  Fact Sheet for Patients: EntrepreneurPulse.com.au  Fact Sheet for Healthcare Providers: IncredibleEmployment.be  This test  is not yet approved or cleared by the [place] and has been authorized for detection and/or diagnosis of SARS-CoV-2 by FDA under an Emergency Use Authorization (EUA). This EUA will remain in effect (meaning this test can be used) for the duration of the COVID-19 declaration under Section 564(b)(1) of the Act, 21 U.S.C. section 360bbb-3(b)(1), unless the authorization is terminated or revoked.  Performed at [facility], [place] [address], [place], [place] [address]   Culture, blood (routine x 2)     Status: Abnormal   Collection Time: 03/26/20  6:33 PM   Specimen: BLOOD  Result Value Ref Range Status   Specimen Description   Final    BLOOD LEFT HAND Performed at [facility] [address] [place], [place] [address]    Special Requests   Final    BOTTLES DRAWN AEROBIC ONLY Blood Culture adequate volume Performed at [facility] [address], [place], [place] [address]    Culture  Setup Time   Final    GRAM POSITIVE COCCI AEROBIC BOTTLE ONLY CRITICAL RESULT CALLED TO, READ BACK BY AND VERIFIED WITH: PHARMD E JACKSON 03/27/20 AT 2213 SK    Culture (A)  Final    ROTHIA MUCILAGINOSA Standardized susceptibility testing for this organism is not available. Performed at [facility], [place] [address], [place], [place] [address]    Report Status  03/29/2020 FINAL  Final  Culture, blood (routine x 2)     Status: None (Preliminary result)   Collection Time: 03/26/20  6:33 PM   Specimen: BLOOD  Result Value Ref Range Status   Specimen Description   Final    BLOOD LEFT ANTECUBITAL Performed at [facility] [address], [place], [place] [address]    Special Requests   Final    BOTTLES DRAWN AEROBIC ONLY Blood Culture adequate volume Performed at [facility] [address], [place], [place] [address]    Culture   Final    NO GROWTH 3 DAYS Performed at [facility], [place] [address] [place], [place] [address]    Report Status PENDING  Incomplete  Blood Culture ID Panel (Reflexed)     Status: None   Collection Time: 03/26/20  6:33 PM  Result Value Ref Range Status   Enterococcus faecalis NOT DETECTED NOT DETECTED Final   Enterococcus Faecium NOT DETECTED NOT DETECTED Final   Listeria monocytogenes NOT DETECTED NOT DETECTED Final   Staphylococcus species NOT DETECTED NOT DETECTED Final   Staphylococcus aureus (BCID) NOT DETECTED NOT DETECTED Final   Staphylococcus epidermidis NOT DETECTED NOT DETECTED Final   Staphylococcus lugdunensis NOT DETECTED NOT DETECTED Final   Streptococcus species NOT DETECTED NOT DETECTED Final   Streptococcus agalactiae NOT DETECTED NOT DETECTED Final   Streptococcus pneumoniae NOT DETECTED NOT DETECTED Final   Streptococcus pyogenes NOT DETECTED NOT DETECTED Final   A.calcoaceticus-baumannii NOT DETECTED NOT DETECTED Final   Bacteroides fragilis NOT DETECTED NOT DETECTED Final   Enterobacterales NOT DETECTED NOT DETECTED Final   Enterobacter cloacae complex NOT DETECTED NOT DETECTED Final   Escherichia coli NOT DETECTED NOT DETECTED Final   Klebsiella aerogenes NOT DETECTED NOT DETECTED Final   Klebsiella oxytoca NOT DETECTED NOT DETECTED Final   Klebsiella pneumoniae NOT DETECTED NOT DETECTED Final   Proteus species NOT DETECTED NOT DETECTED Final    Salmonella species NOT DETECTED NOT DETECTED Final   Serratia marcescens NOT DETECTED NOT DETECTED Final  Haemophilus influenzae NOT DETECTED NOT DETECTED Final   Neisseria meningitidis NOT DETECTED NOT DETECTED Final   Pseudomonas aeruginosa NOT DETECTED NOT DETECTED Final   Stenotrophomonas maltophilia NOT DETECTED NOT DETECTED Final   Candida albicans NOT DETECTED NOT DETECTED Final   Candida auris NOT DETECTED NOT DETECTED Final   Candida glabrata NOT DETECTED NOT DETECTED Final   Candida krusei NOT DETECTED NOT DETECTED Final   Candida parapsilosis NOT DETECTED NOT DETECTED Final   Candida tropicalis NOT DETECTED NOT DETECTED Final   Cryptococcus neoformans/gattii NOT DETECTED NOT DETECTED Final    Comment: Performed at [facility], [place] [address] [place], [place] [address]         Radiology Studies: No results found.      Scheduled Meds: . (feeding supplement) PROSource Plus  30 mL Oral BID WC  . alum & mag hydroxide-simeth  30 mL Oral Once   And  . lidocaine  15 mL Oral Once  . Chlorhexidine Gluconate Cloth  6 each Topical Daily  . feeding supplement  1 Container Oral TID BM  . heparin  5,000 Units Subcutaneous Q8H  . insulin aspart  0-5 Units Subcutaneous QHS  . insulin aspart  0-9 Units Subcutaneous TID WC  . latanoprost  1 drop Both Eyes QHS  . multivitamin  15 mL Oral Daily  . pantoprazole (PROTONIX) IV  40 mg Intravenous Q12H  . polyvinyl alcohol  1 drop Both Eyes TID  . potassium chloride  40 mEq Oral Q4H  . sucralfate  1 g Oral TID WC & HS  . tamsulosin  0.4 mg Oral Daily   Continuous Infusions: . dextrose 5 % and 0.45% NaCl 75 mL/hr at 03/29/20 1013     LOS: 3 days    Time spent: 40 minutes    Irine Seal, MD [facility]   To contact the attending provider between 7A-7P or the covering provider during after hours 7P-7A, please log into the web site [email or web address] and access using universal [place] password for that  web site. If you do not have the password, please call the hospital operator.  03/29/2020, 11:09 AM

## 2020-03-29 NOTE — Consult Note (Addendum)
Chief Complaint: Patient was seen in consultation today for  Chief Complaint  Patient presents with   Dysphagia    Referring Physician(s): Dr. Cristina Gong  Supervising Physician: Dr. Laurence Ferrari  Patient Status: [facility] - In-pt  History of Present Illness: John Walton is a 86 y.o. male with a medical history significant for HTN, alcohol use (in remission), glaucoma and chronic, intermittent dysphagia. He presented to the ED 03/26/20 with worsening dysphagia. He had a recent hospital admission at [facility] for the same issue and underwent extensive speech and swallow evaluations including an EGD and imaging of his head and neck. The work up revealed an enlarged thyroid, a neck osteophyte and some mild esophagitis.   During this admission at [facility], the patient has been evaluated by GI and Speech therapy with possible plans for a modified barium swallow at some point. He continues to be unable to tolerate PO intake with the exception of a few ice chips.   Interventional Radiology has been asked to evaluate this patient for an image-guided gastrostomy tube placement for help with managing his chronic dysphagia and long-term nutritional needs. This case has been reviewed and procedure approved by Dr. Kathlene Cote.   Past Medical History:  Diagnosis Date   Chicken pox    Frequent headaches    Glaucoma    Hay fever    History of blood transfusion    HTN (hypertension)    Hyperlipidemia    Hyperlipidemia    Migraines    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[facility])     Past Surgical History:  Procedure Laterality Date   CHOLECYSTECTOMY, LAPAROSCOPIC     TONSILLECTOMY  1942   TOTAL HIP ARTHROPLASTY Left 03/08/2020   Procedure: TOTAL HIP ARTHROPLASTY;  Surgeon: Marybelle Killings, MD;  Location: [facility] ORS;  Service: Orthopedics;  Laterality: Left;    Allergies: Robitussin cold cough+ chest [dextromethorphan-guaifenesin]  Medications: Prior to Admission medications   Medication Sig  Start Date End Date Taking? Authorizing Provider  acetaminophen (TYLENOL) 325 MG tablet Take 2 tablets (650 mg total) by mouth every 6 (six) hours as needed for mild pain (or Fever >/= 101). Patient taking differently: Take 650 mg by mouth every 6 (six) hours as needed for mild pain or fever (or Fever >/= 101). 03/13/20  Yes Allie Bossier, MD  alum & mag hydroxide-simeth (MAALOX/MYLANTA) 200-200-20 MG/5ML suspension Take 15 mLs by mouth every 4 (four) hours as needed for indigestion or heartburn. 03/13/20  Yes Allie Bossier, MD  amLODipine (NORVASC) 5 MG tablet Take 1 tablet (5 mg total) by mouth daily. 03/14/20  Yes Allie Bossier, MD  aspirin 325 MG EC tablet Take 1 tablet (325 mg total) by mouth daily with breakfast. MUST TAKE AT LEAST 4 WEEKS POSTOP FOR DVT PROPHYLAXIS Patient taking differently: Take 325 mg by mouth daily with breakfast. 03/11/20  Yes Lanae Crumbly, PA-C  atorvastatin (LIPITOR) 40 MG tablet Take 1 tablet (40 mg total) by mouth daily. 03/14/20  Yes Allie Bossier, MD  carboxymethylcellulose (REFRESH PLUS) 0.5 % SOLN Place 1 drop into both eyes in the morning, at noon, and at bedtime.   Yes [provider]  HYDROcodone-acetaminophen (NORCO) 10-325 MG tablet Take 1 tablet by mouth every 6 (six) hours as needed. Patient taking differently: Take 1 tablet by mouth every 6 (six) hours as needed for moderate pain. 03/10/20  Yes Lanae Crumbly, PA-C  insulin glargine (LANTUS) 100 UNIT/ML Solostar Pen Inject 5 Units into the skin daily.  03/13/20  Yes Allie Bossier, MD  latanoprost (XALATAN) 0.005 % ophthalmic solution Place 1 drop into both eyes at bedtime.  03/13/19  Yes [provider]  metoCLOPramide (REGLAN) 5 MG tablet Take 1-2 tablets (5-10 mg total) by mouth every 8 (eight) hours as needed for nausea (if ondansetron (ZOFRAN) ineffective.). 03/13/20  Yes Allie Bossier, MD  pantoprazole (PROTONIX) 40 MG tablet Take 40 mg by mouth daily.   Yes [provider]  Polyethyl Glycol-Propyl Glycol (SYSTANE) 0.4-0.3 % GEL ophthalmic gel Place 1 application into both eyes in the morning and at bedtime.   Yes [provider]  senna-docusate (SENOKOT-S) 8.6-50 MG tablet Take 1 tablet by mouth 2 (two) times daily as needed for mild constipation. 03/13/20  Yes Allie Bossier, MD  SIMBRINZA 1-0.2 % SUSP Place 1 drop into both eyes in the morning and at bedtime.  05/16/19  Yes [provider]  tamsulosin (FLOMAX) 0.4 MG CAPS capsule Take 1 capsule (0.4 mg total) by mouth daily after supper. Patient taking differently: Take 0.4 mg by mouth daily. 03/13/20  Yes Allie Bossier, MD  docusate sodium (COLACE) 100 MG capsule Take 1 capsule (100 mg total) by mouth 2 (two) times daily. Patient not taking: Reported on 03/26/2020 03/13/20   Allie Bossier, MD  guaiFENesin-dextromethorphan [facility] DM) 100-10 MG/5ML syrup Take 5 mLs by mouth every 4 (four) hours as needed for cough. Patient taking differently: Take 10 mLs by mouth every 8 (eight) hours as needed for cough. 03/13/20   Allie Bossier, MD  Insulin Pen Needle 29G X 12.7MM MISC Use as directed 03/13/20   Allie Bossier, MD     Family History  Problem Relation Age of Onset   Liver disease Mother    Skin cancer Father    Skin cancer Paternal Grandfather    Diabetes Maternal Grandfather     Social History   Socioeconomic History   Marital status: Married    Spouse name: Not on file   Number of children: Not on file   Years of education: Not on file   Highest education level: Not on file  Occupational History   Not on file  Tobacco Use   Smoking status: Former Smoker    Start date: 04/01/1968    Quit date: 01/11/1977    Years since quitting: 43.2   Smokeless tobacco: Never Used  Vaping Use   Vaping Use: Never used  Substance and Sexual Activity   Alcohol use: No   Drug use: No   Sexual activity: Never  Other Topics Concern   Not on file   Social History Narrative   Not on file   Social Determinants of Health   Financial Resource Strain: Not on file  Food Insecurity: Not on file  Transportation Needs: Not on file  Physical Activity: Not on file  Stress: Not on file  Social Connections: Not on file    Review of Systems: A 12 point ROS discussed and pertinent positives are indicated in the HPI above.  All other systems are negative.  Review of Systems  Constitutional: Positive for appetite change, fatigue and unexpected weight change.  Respiratory: Negative for cough and shortness of breath.   Cardiovascular: Negative for chest pain and leg swelling.  Gastrointestinal: Negative for abdominal pain, diarrhea, nausea and vomiting.  Genitourinary: Negative for flank pain.  Musculoskeletal: Negative for back pain.  Neurological: Negative for headaches.    Vital Signs: BP (!) 156/57  Pulse 74    Temp 98.3 F (36.8 C) (Oral)    Resp 20    Ht 5\' 6"  (1.676 m)    Wt 135 lb (61.2 kg)    SpO2 99%    BMI 21.79 kg/m   Physical Exam Constitutional:      General: He is not in acute distress.    Appearance: He is cachectic. He is ill-appearing.  HENT:     Ears:     Comments: Hard of hearing; requires hearing aids    Mouth/Throat:     Mouth: Mucous membranes are dry.     Pharynx: Oropharynx is clear.  Cardiovascular:     Rate and Rhythm: Normal rate and regular rhythm.     Pulses: Normal pulses.     Heart sounds: Normal heart sounds.  Pulmonary:     Effort: Pulmonary effort is normal.     Breath sounds: Normal breath sounds.  Abdominal:     General: Bowel sounds are normal.     Palpations: Abdomen is soft.  Skin:    General: Skin is warm and dry.  Neurological:     Mental Status: He is alert and oriented to person, place, and time.     Imaging: DG Chest 1 View  Result Date: 03/07/2020 CLINICAL DATA:  Preop exam. EXAM: CHEST  1 VIEW COMPARISON:  Chest x-ray 4 06/20/1998. FINDINGS: Mediastinum and hilar  structures normal. Heart size normal. Low lung volumes with mild left base subsegmental axis. No pleural effusion or pneumothorax. Degenerative change thoracic spine and both shoulders. IMPRESSION: Low lung volumes with mild left base subsegmental atelectasis. Electronically Signed   By: Marcello Moores  Register   On: 03/07/2020 13:07   DG Shoulder Right  Result Date: 03/07/2020 CLINICAL DATA:  Right shoulder pain after a fall last night. EXAM: RIGHT SHOULDER - 2+ VIEW COMPARISON:  None. FINDINGS: No acute fracture or dislocation is identified. Mild-to-moderate joint space narrowing and marginal spurring are noted at the acromioclavicular joint. Narrowing of the acromial humeral interval could indicate a rotator cuff tear. IMPRESSION: No acute osseous abnormality identified. Electronically Signed   By: Logan Bores M.D.   On: 03/07/2020 13:05   CT HEAD WO CONTRAST  Result Date: 03/07/2020 CLINICAL DATA:  Head trauma, minor (Age >= 65y) EXAM: CT HEAD WITHOUT CONTRAST TECHNIQUE: Contiguous axial images were obtained from the base of the skull through the vertex without intravenous contrast. COMPARISON:  04/23/2015 and prior FINDINGS: Brain: No acute infarct or intracranial hemorrhage. No mass lesion. No midline shift, ventriculomegaly or extra-axial fluid collection. Mild cerebral atrophy with ex vacuo dilatation. Chronic microvascular ischemic changes. Remote right frontal insult. Vascular: No hyperdense vessel or unexpected calcification. Bilateral carotid siphon atherosclerotic calcifications. Skull: Negative for fracture or focal lesion. Sinuses/Orbits: No acute orbital finding. Pneumatized paranasal sinuses and mastoid air cells. Other: Right frontal scalp hematoma. IMPRESSION: 1. No acute intracranial process.  Remote right frontal insult. 2. Mild cerebral atrophy and chronic microvascular ischemic changes. 3. Right frontal scalp hematoma. Electronically Signed   By: Primitivo Gauze M.D.   On: 03/07/2020  14:49   US Abdomen Complete  Result Date: 03/07/2020 CLINICAL DATA:  Abdominal distension EXAM: ABDOMEN ULTRASOUND COMPLETE COMPARISON:  None FINDINGS: Gallbladder: Surgically absent Common bile duct: Diameter: 5 mm, normal Liver: Normal echogenicity without mass or nodularity. Portal vein is patent on color Doppler imaging with normal direction of blood flow towards the liver. IVC: Short segment of intrahepatic IVC normal appearance, remainder obscured by bowel gas Pancreas: Portion  of head to and proximal tail normal appearance, remainder obscured by bowel gas Spleen: Normal appearance, 4.8 cm length Right Kidney: Length: 9.8 cm. Normal cortical thickness. Increased cortical echogenicity. No mass or shadowing calcification. Mild hydronephrosis. Left Kidney: Length: 9.9 cm. Normal cortical thickness. Increased cortical echogenicity. No mass or calcification. Mild hydronephrosis. Abdominal aorta: Small portion of mid abdominal aorta normal caliber, remainder obscured by bowel gas Other findings: No free fluid IMPRESSION: Post cholecystectomy. Medical renal disease changes of both kidneys with mild BILATERAL hydronephrosis of uncertain etiology. Inadequate visualization of pancreas and aorta. Electronically Signed   By: Lavonia Dana M.D.   On: 03/07/2020 18:25   CT HIP LEFT WO CONTRAST  Result Date: 03/07/2020 CLINICAL DATA:  Left hip pain after fall.  Abnormal x-ray. EXAM: CT OF THE LEFT HIP WITHOUT CONTRAST TECHNIQUE: Multidetector CT imaging of the left hip was performed according to the standard protocol. Multiplanar CT image reconstructions were also generated. COMPARISON:  X-ray 03/07/2020 FINDINGS: Bones/Joint/Cartilage Acute nondisplaced fracture of the left femoral neck and intertrochanteric region. No significant impaction or angulation. No fracture involvement of the femoral head. Severe osteoarthritis of the left hip joint with complete joint space loss, subchondral sclerosis/cystic change, and  marginal osteophyte formation. Visualized portion of the left hemipelvis is intact without fracture. The inferior aspect of the left SI joint is intact without diastasis. Ligaments Suboptimally assessed by CT. Muscles and Tendons No acute musculotendinous abnormality by CT. Soft tissues There is induration within the subcutaneous soft tissues overlying the greater trochanter without organized soft tissue collection or hematoma. Visualized prostate gland is enlarged. Scattered vascular calcifications. IMPRESSION: 1. Acute nondisplaced fracture of the left femoral neck and intertrochanteric region. 2. 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 3. Enlarged prostate gland. Electronically Signed   By: Davina Poke D.O.   On: 03/07/2020 15:31   DG Chest Portable 1 View  Result Date: 03/26/2020 CLINICAL DATA:  Choose recent choking episode EXAM: PORTABLE CHEST 1 VIEW COMPARISON:  03/19/2020 FINDINGS: Cardiac shadow is within normal limits. Aortic calcifications are noted. Lungs are well aerated bilaterally. No focal infiltrate or sizable effusion is seen. No bony abnormality is noted. IMPRESSION: No active disease. Electronically Signed   By: Inez Catalina M.D.   On: 03/26/2020 09:50   DG C-Arm 1-60 Min-No Report  Result Date: 03/08/2020 Fluoroscopy was utilized by the requesting physician.  No radiographic interpretation.   ECHOCARDIOGRAM COMPLETE  Result Date: 03/08/2020    ECHOCARDIOGRAM REPORT   Patient Name:   John Walton Date of Exam: 03/08/2020 Medical Rec #:  [id number]       Height:       66.0 in Accession #:    [id number]      Weight:       138.4 lb Date of Birth:  [birth year]       BSA:          1.710 m Patient Age:    19 years        BP:           120/58 mmHg Patient Gender: M               HR:           92 bpm. Exam Location:  Inpatient Procedure: 2D Echo, Cardiac Doppler and Color Doppler Indications:    Murmur 785.2 / R01.1  History:        Patient has no prior history of Echocardiogram  examinations.  Risk Factors:Hypertension, Dyslipidemia and Former Smoker.  Sonographer:    Vickie Epley RDCS Referring Phys: [place]  1. Left ventricular ejection fraction, by estimation, is 55 to 60%. The left ventricle has normal function. The left ventricle has no regional wall motion abnormalities. There is moderate left ventricular hypertrophy. Left ventricular diastolic parameters are indeterminate.  2. Right ventricular systolic function is normal. The right ventricular size is normal. Tricuspid regurgitation signal is inadequate for assessing PA pressure.  3. The mitral valve is normal in structure. No evidence of mitral valve regurgitation.  4. Aortic dilatation noted. There is mild dilatation of the aortic root, measuring 40 mm.  5. The inferior vena cava is normal in size with greater than 50% respiratory variability, suggesting right atrial pressure of 3 mmHg.  6. The aortic valve is calcified. There is moderate calcification of the aortic valve. Aortic valve regurgitation is not visualized. Mild to moderate aortic valve stenosis. Vmax 2.2 m/s, MG 12 mmHg, AVA 1.5 cm^2, DI 0.33 FINDINGS  Left Ventricle: Left ventricular ejection fraction, by estimation, is 55 to 60%. The left ventricle has normal function. The left ventricle has no regional wall motion abnormalities. The left ventricular internal cavity size was normal in size. There is  moderate left ventricular hypertrophy. Left ventricular diastolic parameters are indeterminate. Right Ventricle: The right ventricular size is normal. Right vetricular wall thickness was not assessed. Right ventricular systolic function is normal. Tricuspid regurgitation signal is inadequate for assessing PA pressure. Left Atrium: Left atrial size was normal in size. Right Atrium: Right atrial size was normal in size. Pericardium: Trivial pericardial effusion is present. Mitral Valve: The mitral valve is normal in structure. No  evidence of mitral valve regurgitation. Tricuspid Valve: The tricuspid valve is normal in structure. Tricuspid valve regurgitation is trivial. Aortic Valve: The aortic valve is calcified. There is moderate calcification of the aortic valve. Aortic valve regurgitation is not visualized. Mild to moderate aortic stenosis is present. Aortic valve mean gradient measures 12.0 mmHg. Aortic valve peak gradient measures 19.2 mmHg. Aortic valve area, by VTI measures 1.54 cm. Pulmonic Valve: The pulmonic valve was not well visualized. Pulmonic valve regurgitation is trivial. Aorta: Aortic dilatation noted. There is mild dilatation of the aortic root, measuring 40 mm. Venous: The inferior vena cava is normal in size with greater than 50% respiratory variability, suggesting right atrial pressure of 3 mmHg. IAS/Shunts: The interatrial septum was not well visualized.  LEFT VENTRICLE PLAX 2D LVIDd:         4.90 cm      Diastology LVIDs:         3.40 cm      LV e' medial:    5.48 cm/s LV PW:         0.90 cm      LV E/e' medial:  16.3 LV IVS:        0.90 cm      LV e' lateral:   7.50 cm/s LVOT diam:     2.40 cm      LV E/e' lateral: 11.9 LV SV:         70 LV SV Index:   41 LVOT Area:     4.52 cm  LV Volumes (MOD) LV vol d, MOD A2C: 84.8 ml LV vol d, MOD A4C: 102.0 ml LV vol s, MOD A2C: 37.0 ml LV vol s, MOD A4C: 47.8 ml LV SV MOD A2C:     47.8 ml LV SV MOD A4C:  102.0 ml LV SV MOD BP:      51.4 ml RIGHT VENTRICLE RV S prime:     13.70 cm/s TAPSE (M-mode): 1.6 cm LEFT ATRIUM           Index       RIGHT ATRIUM           Index LA diam:      2.30 cm 1.34 cm/m  RA Area:     11.10 cm LA Vol (A2C): 22.1 ml 12.92 ml/m RA Volume:   25.40 ml  14.85 ml/m LA Vol (A4C): 28.1 ml 16.43 ml/m  AORTIC VALVE AV Area (Vmax):    1.82 cm AV Area (Vmean):   1.66 cm AV Area (VTI):     1.54 cm AV Vmax:           219.00 cm/s AV Vmean:          166.000 cm/s AV VTI:            0.451 m AV Peak Grad:      19.2 mmHg AV Mean Grad:      12.0 mmHg LVOT  Vmax:         88.10 cm/s LVOT Vmean:        60.900 cm/s LVOT VTI:          0.154 m LVOT/AV VTI ratio: 0.34  AORTA Ao Root diam: 4.00 cm Ao Asc diam:  3.50 cm MITRAL VALVE MV Area (PHT): 3.39 cm     SHUNTS MV Decel Time: 224 msec     Systemic VTI:  0.15 m MV E velocity: 89.10 cm/s   Systemic Diam: 2.40 cm MV A velocity: 104.00 cm/s MV E/A ratio:  0.86 Oswaldo Milian MD Electronically signed by Oswaldo Milian MD Signature Date/Time: 03/08/2020/10:08:13 AM    Final    CT RENAL STONE STUDY  Result Date: 03/09/2020 CLINICAL DATA:  Hydronephrosis, chronic kidney disease EXAM: CT ABDOMEN AND PELVIS WITHOUT CONTRAST TECHNIQUE: Multidetector CT imaging of the abdomen and pelvis was performed following the standard protocol without IV contrast. COMPARISON:  Abdominal ultrasound 03/07/2020 FINDINGS: Lower chest: Bandlike densities in the lung bases more concentrated in the right lower lobe, favoring atelectasis or scarring. Descending thoracic aortic atherosclerosis. Low-density blood pool suggests anemia. Hepatobiliary: Cholecystectomy. Pancreas: Unremarkable Spleen: Unremarkable Adrenals/Urinary Tract: 1.5 by 1.6 cm hypodense lesion of the left kidney upper pole, internal density 12 Hounsfield units on image 27 series 2, probably a cyst but technically nonspecific on today's noncontrast examination. Adrenal glands normal. Exophytic 1.3 by 1.3 cm lesion from the left kidney lower pole, internal density 10 Hounsfield units on image 43/2, probably a cyst but technically nonspecific. A Foley catheter is present in the urinary bladder which is nondistended but thick walled. Small amount of gas in the urinary bladder. Stomach/Bowel: Large periampullary duodenal diverticulum without findings of inflammation. Vascular/Lymphatic: Aortoiliac atherosclerotic vascular disease. Reproductive: Marked prostatomegaly. Other: No supplemental non-categorized findings. Musculoskeletal: There is abnormal gas tracking along the  anterior margin of the left iliopsoas muscle down towards the insertion, and also along the left gluteus musculature and left upper thigh musculature anteriorly, probably related to the patient's left hip surgery this afternoon. Indistinctness of surrounding fascia planes, likely postoperative. Overall the amount of gas and edema in this vicinity is consistent with today's earlier operation. On images 69 and 70 of series 2, there is a trace amount of gas in what appears to be a subtle cortical discontinuity along the anterior wall of the acetabulum which is  not readily apparent on the CT from 03/07/2020. The possibility of a subtle fracture in this vicinity is raised, although admittedly the finding is very localized/focal. Severe osteoarthritis of the right hip. Multilevel lumbar spondylosis and degenerative disc disease causing mild degrees of foraminal impingement. IMPRESSION: 1. There is a trace amount of gas in what appears to be a subtle cortical discontinuity along the anterior wall of the acetabulum which is not readily apparent on the hip CT from 03/07/2020. The possibility of a subtle anterior acetabular wall fracture in this vicinity is raised, although admittedly the finding is very localized/focal. 2. Marked prostatomegaly. 3. Other imaging findings of potential clinical significance: Low-density blood pool suggests anemia. 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 Severe osteoarthritis of the right hip. Expected postoperative findings in the tissue surrounding the left hip prosthesis. Multilevel lumbar spondylosis and degenerative disc disease causing mild degrees of foraminal impingement. 4. Aortic atherosclerosis. Aortic Atherosclerosis (ICD10-I70.0). Electronically Signed   By: Van Clines M.D.   On: 03/09/2020 14:28   DG Hip Port Unilat With Pelvis 1V Left  Result Date: 03/08/2020 CLINICAL DATA:  86 year old male undergoing hip arthroplasty after  traumatic fracture. EXAM: DG HIP (WITH OR WITHOUT PELVIS) 1V PORT LEFT COMPARISON:  Intraoperative images 16 35 hours today. FINDINGS: Portable AP view at 1715 hours. Left hip bipolar arthroplasty demonstrated on this slightly oblique view. Hardware appears intact and normally aligned. Regional postoperative soft tissue gas. Overlying skin staples. No unexpected osseous changes. IMPRESSION: Mildly oblique view of bipolar left hip arthroplasty with no adverse features. Electronically Signed   By: Genevie Ann M.D.   On: 03/08/2020 19:32   DG HIP OPERATIVE UNILAT W OR W/O PELVIS LEFT  Result Date: 03/08/2020 CLINICAL DATA:  86 year old male undergoing hip arthroplasty after traumatic fracture. EXAM: OPERATIVE LEFT HIP (WITH PELVIS IF PERFORMED) 2 VIEWS TECHNIQUE: Fluoroscopic spot image(s) were submitted for interpretation post-operatively. COMPARISON:  LEFT HIP CT 03/07/2020. FINDINGS: Two intraoperative fluoroscopic AP spot views of the left hip demonstrate bipolar arthroplasty underway. No unexpected osseous changes are visible. FLUOROSCOPY TIME:  0 minutes 2 seconds IMPRESSION: Left hip arthroplasty underway. Electronically Signed   By: Genevie Ann M.D.   On: 03/08/2020 19:31   DG Hip Unilat With Pelvis 2-3 Views Left  Result Date: 03/07/2020 CLINICAL DATA:  Left hip pain after fall EXAM: DG HIP (WITH OR WITHOUT PELVIS) 2-3V LEFT COMPARISON:  None. FINDINGS: Cortical irregularity along the lateral aspect of the left femoral neck suspicious for a nondisplaced fracture. No displacement or angulation. Severe degenerative changes of the bilateral hip joints. Pelvic bony ring intact. Advanced vascular calcifications. IMPRESSION: Findings suspicious for a nondisplaced fracture of the left femoral neck. Electronically Signed   By: Davina Poke D.O.   On: 03/07/2020 13:04    Labs:  CBC: Recent Labs    03/26/20 1833 03/27/20 0415 03/28/20 0546 03/29/20 0447  WBC 9.5 8.5 6.0 6.9  HGB 9.2* 8.3* 8.8* 9.1*   HCT 30.4* 27.0* 28.6* 29.0*  PLT 383 307 290 312    COAGS: Recent Labs    03/07/20 1156  INR 1.1    BMP: Recent Labs    03/26/20 1049 03/26/20 1833 03/27/20 0415 03/28/20 0546 03/29/20 0447  NA 146*  --  149* 150* 146*  K 3.8  --  3.5 3.2* 3.0*  CL 112*  --  117* 118* 114*  CO2 21*  --  20* 21* 22  GLUCOSE 86  --  78 99 161*  BUN  33*  --  27* 27* 20  CALCIUM 8.2*  --  7.7* 7.7* 7.6*  CREATININE 1.90* 1.70* 1.66* 1.58* 1.39*  GFRNONAA 34* 39* 40* 43* 50*    LIVER FUNCTION TESTS: Recent Labs    03/12/20 0326 03/13/20 0255 03/26/20 1049 03/27/20 0415  BILITOT 0.8 0.8 0.9 0.7  AST 24 26 28 17   ALT 12 14 31 21   ALKPHOS 60 67 83 68  PROT 6.1* 6.5 6.7 5.8*  ALBUMIN 2.8* 3.0* 2.8* 2.5*    TUMOR MARKERS: No results for input(s): AFPTM, CEA, CA199, CHROMGRNA in the last 8760 hours.  Assessment and Plan:  Chronic dysphagia: John Walton, 86 year old male, presents today to the [facility] department for an image-guided gastrostomy tube placement. Due to hearing issues and patient request, the patient's wife signed the consent form.   Risks and benefits image guided gastrostomy tube placement was discussed with the patient and his wife including, but not limited to the need for a barium enema during the procedure, bleeding, infection, peritonitis and/or damage to adjacent structures.  All of the patient's questions were answered, patient is agreeable to proceed.  The patient has been NPO. Labs and vitals have been reviewed. Last dose of subcutaneous heparin was 03/29/20 at 0524.   Consent signed and in chart.  Thank you for this interesting consult.  I greatly enjoyed meeting John Walton and look forward to participating in their care.  A copy of this report was sent to the requesting provider on this date.  Electronically Signed: Soyla Dryer, AGACNP-BC [phone] 03/29/2020, 11:02 AM   I spent a total of 20 Minutes     in face to face in clinical consultation, greater than 50% of which was counseling/coordinating care for gastrostomy tube placement.

## 2020-03-29 NOTE — Progress Notes (Signed)
Patient continues to refuse all PO meds and drinks. States that he is having too much trouble swallowing. Will only accept ice chips.

## 2020-03-29 NOTE — Progress Notes (Addendum)
SLP Cancellation Note  Patient Details Name: John Walton MRN: [id number] DOB: Feb 08, 1933   Cancelled treatment:       Reason Eval/Treat Not Completed: Other (comment) (Fortunately received PEG for nutritional purposes.  Agree that MBS may be completed after PEG placement.    Anticipate severe pharyngeal-esophageal dysphagia obstructive in nature from his DISH will be present.  Recommend continue ice chips as pt tolerates.    Will follow up.    Kathleen Lime, MS [facility] SLP Acute Rehab Services Office [phone] Pager [phone]   [name] 03/29/2020, 1:47 PM

## 2020-03-29 NOTE — Progress Notes (Addendum)
Nutrition Brief Note  Noted pt is refusing most PO meds and liquids. Per GI note, recommending PEG placement this week.  Have d/c Calorie count as pt on clears and not likely to meet needs.  TF recommendations: -Initiate Osmolite 1.5 @ 20 ml/hr, advance by 10 ml every 12 hours to goal rate of 60 ml/hr. -Free water of 150 ml 5 times daily -Provides 1980 kcals, 82g protein and 1097 ml H2O.  Monitor magnesium, potassium, and phosphorus daily for at least 3 days, MD to replete as needed, as pt is at risk for refeeding syndrome.  Please consult RD for TF initiation and management once tube is ready for use. -Addendum: Consult for TF initiation placed by MD. Placed TF orders to start tomorrow at 0800.  Will follow.  Clayton Bibles, MS, RD, LDN Inpatient Clinical Dietitian Contact information available via Amion

## 2020-03-30 DIAGNOSIS — R1310 Dysphagia, unspecified: Secondary | ICD-10-CM | POA: Diagnosis not present

## 2020-03-30 DIAGNOSIS — N184 Chronic kidney disease, stage 4 (severe): Secondary | ICD-10-CM | POA: Diagnosis not present

## 2020-03-30 DIAGNOSIS — R5381 Other malaise: Secondary | ICD-10-CM | POA: Diagnosis not present

## 2020-03-30 DIAGNOSIS — E119 Type 2 diabetes mellitus without complications: Secondary | ICD-10-CM | POA: Diagnosis not present

## 2020-03-30 LAB — CBC
HCT: 28.9 % — ABNORMAL LOW (ref 39.0–52.0)
Hemoglobin: 9.1 g/dL — ABNORMAL LOW (ref 13.0–17.0)
MCH: 28.9 pg (ref 26.0–34.0)
MCHC: 31.5 g/dL (ref 30.0–36.0)
MCV: 91.7 fL (ref 80.0–100.0)
Platelets: 295 10*3/uL (ref 150–400)
RBC: 3.15 MIL/uL — ABNORMAL LOW (ref 4.22–5.81)
RDW: 13.7 % (ref 11.5–15.5)
WBC: 8.2 10*3/uL (ref 4.0–10.5)
nRBC: 0 % (ref 0.0–0.2)

## 2020-03-30 LAB — BASIC METABOLIC PANEL
Anion gap: 9 (ref 5–15)
BUN: 18 mg/dL (ref 8–23)
CO2: 22 mmol/L (ref 22–32)
Calcium: 7.7 mg/dL — ABNORMAL LOW (ref 8.9–10.3)
Chloride: 113 mmol/L — ABNORMAL HIGH (ref 98–111)
Creatinine, Ser: 1.43 mg/dL — ABNORMAL HIGH (ref 0.61–1.24)
GFR, Estimated: 48 mL/min — ABNORMAL LOW (ref >60)
Glucose, Bld: 110 mg/dL — ABNORMAL HIGH (ref 70–99)
Potassium: 3.4 mmol/L — ABNORMAL LOW (ref 3.5–5.1)
Sodium: 144 mmol/L (ref 135–145)

## 2020-03-30 LAB — PHOSPHORUS
Phosphorus: 2.4 mg/dL — ABNORMAL LOW (ref 2.5–4.6)
Phosphorus: 4.2 mg/dL (ref 2.5–4.6)

## 2020-03-30 LAB — MAGNESIUM
Magnesium: 1.9 mg/dL (ref 1.7–2.4)
Magnesium: 2.4 mg/dL (ref 1.7–2.4)

## 2020-03-30 LAB — GLUCOSE, CAPILLARY
Glucose-Capillary: 112 mg/dL — ABNORMAL HIGH (ref 70–99)
Glucose-Capillary: 169 mg/dL — ABNORMAL HIGH (ref 70–99)
Glucose-Capillary: 232 mg/dL — ABNORMAL HIGH (ref 70–99)
Glucose-Capillary: 160 mg/dL — ABNORMAL HIGH (ref 70–99)

## 2020-03-30 MED ORDER — SODIUM CHLORIDE 0.45 % IV SOLN: INTRAVENOUS | Status: DC

## 2020-03-30 MED ORDER — POLYETHYLENE GLYCOL 3350 17 G PO PACK: 17.0000 g | PACK | Freq: Two times a day (BID) | ORAL | Status: DC

## 2020-03-30 MED ORDER — SODIUM CHLORIDE 0.9 % IV SOLN: INTRAVENOUS | Status: DC

## 2020-03-30 MED ORDER — POTASSIUM PHOSPHATES 15 MMOLE/5ML IV SOLN
30.0000 mmol | Freq: Once | INTRAVENOUS | Status: AC
  Administered 2020-03-30: 30 mmol via INTRAVENOUS
  Filled 2020-03-30: qty 10

## 2020-03-30 MED ORDER — POTASSIUM CHLORIDE 20 MEQ PO PACK: 40.0000 meq | PACK | Freq: Once | ORAL | Status: DC

## 2020-03-30 MED ORDER — SENNOSIDES 8.8 MG/5ML PO SYRP
5.0000 mL | ORAL_SOLUTION | Freq: Two times a day (BID) | ORAL | Status: DC
  Administered 2020-03-30 – 2020-03-31 (×2): 5 mL
  Filled 2020-03-30 (×3): qty 5

## 2020-03-30 MED ORDER — MAGNESIUM SULFATE 2 GM/50ML IV SOLN
2.0000 g | Freq: Once | INTRAVENOUS | Status: AC
  Administered 2020-03-30: 2 g via INTRAVENOUS
  Filled 2020-03-30: qty 50

## 2020-03-30 MED ORDER — BISACODYL 10 MG RE SUPP
10.0000 mg | Freq: Every day | RECTAL | Status: DC
  Administered 2020-03-30: 10 mg via RECTAL
  Filled 2020-03-30 (×2): qty 1

## 2020-03-30 MED ORDER — POTASSIUM CHLORIDE 20 MEQ PO PACK
20.0000 meq | PACK | Freq: Once | ORAL | Status: AC
  Administered 2020-03-30: 20 meq
  Filled 2020-03-30: qty 1

## 2020-03-30 NOTE — Progress Notes (Signed)
SLP Cancellation Note  Patient Details Name: John Walton MRN: [id number] DOB: [birth year]   Cancelled treatment:       Reason Eval/Treat Not Completed: Other (comment) (SLP planned MBS this am per communication with MD, however pt states "It's not going to work" admitting his swallowing is still impaired.  SLP again suspects MBS would only show gross dysphagia.  Messaged hospitalist who requested SLP speak to [facility].  Dr Cristina Gong and SLP collaborated re: pt's care plan.  We are in agreement that holding off on MBS until pt is closer to discharge may yield optimal results to allow him time for benefit of nutrition.  If DC is imminent or planned over the weekend, MBS advised to allow objective/instrumental study.    Kathleen Lime, MS [facility] SLP Acute Rehab Services Office [phone] Pager [phone]       John Walton 03/30/2020, 8:41 AM

## 2020-03-30 NOTE — Progress Notes (Signed)
Referring Physician(s): Dr. Cristina Gong  Supervising Physician: Aletta Edouard  Patient Status:  [facility] - In-pt  Chief Complaint: Dysphagia s/p gastrostomy tube placement 03/29/20  Subjective: Patient resting comfortably in bed, tube feeds infusing. No complaints related to the gastrostomy tube.   Allergies: Robitussin cold cough+ chest [dextromethorphan-guaifenesin]  Medications: Prior to Admission medications   Medication Sig Start Date End Date Taking? Authorizing Provider  acetaminophen (TYLENOL) 325 MG tablet Take 2 tablets (650 mg total) by mouth every 6 (six) hours as needed for mild pain (or Fever >/= 101). Patient taking differently: Take 650 mg by mouth every 6 (six) hours as needed for mild pain or fever (or Fever >/= 101). 03/13/20  Yes Allie Bossier, MD  alum & mag hydroxide-simeth (MAALOX/MYLANTA) 200-200-20 MG/5ML suspension Take 15 mLs by mouth every 4 (four) hours as needed for indigestion or heartburn. 03/13/20  Yes Allie Bossier, MD  amLODipine (NORVASC) 5 MG tablet Take 1 tablet (5 mg total) by mouth daily. 03/14/20  Yes Allie Bossier, MD  aspirin 325 MG EC tablet Take 1 tablet (325 mg total) by mouth daily with breakfast. MUST TAKE AT LEAST 4 WEEKS POSTOP FOR DVT PROPHYLAXIS Patient taking differently: Take 325 mg by mouth daily with breakfast. 03/11/20  Yes Lanae Crumbly, PA-C  atorvastatin (LIPITOR) 40 MG tablet Take 1 tablet (40 mg total) by mouth daily. 03/14/20  Yes Allie Bossier, MD  carboxymethylcellulose (REFRESH PLUS) 0.5 % SOLN Place 1 drop into both eyes in the morning, at noon, and at bedtime.   Yes [provider]  HYDROcodone-acetaminophen (NORCO) 10-325 MG tablet Take 1 tablet by mouth every 6 (six) hours as needed. Patient taking differently: Take 1 tablet by mouth every 6 (six) hours as needed for moderate pain. 03/10/20  Yes Lanae Crumbly, PA-C  insulin glargine (LANTUS) 100 UNIT/ML Solostar Pen Inject 5 Units into the skin daily.  03/13/20  Yes Allie Bossier, MD  latanoprost (XALATAN) 0.005 % ophthalmic solution Place 1 drop into both eyes at bedtime.  03/13/19  Yes [provider]  metoCLOPramide (REGLAN) 5 MG tablet Take 1-2 tablets (5-10 mg total) by mouth every 8 (eight) hours as needed for nausea (if ondansetron (ZOFRAN) ineffective.). 03/13/20  Yes Allie Bossier, MD  pantoprazole (PROTONIX) 40 MG tablet Take 40 mg by mouth daily.   Yes [provider]  Polyethyl Glycol-Propyl Glycol (SYSTANE) 0.4-0.3 % GEL ophthalmic gel Place 1 application into both eyes in the morning and at bedtime.   Yes [provider]  senna-docusate (SENOKOT-S) 8.6-50 MG tablet Take 1 tablet by mouth 2 (two) times daily as needed for mild constipation. 03/13/20  Yes Allie Bossier, MD  SIMBRINZA 1-0.2 % SUSP Place 1 drop into both eyes in the morning and at bedtime.  05/16/19  Yes [provider]  tamsulosin (FLOMAX) 0.4 MG CAPS capsule Take 1 capsule (0.4 mg total) by mouth daily after supper. Patient taking differently: Take 0.4 mg by mouth daily. 03/13/20  Yes Allie Bossier, MD  docusate sodium (COLACE) 100 MG capsule Take 1 capsule (100 mg total) by mouth 2 (two) times daily. Patient not taking: Reported on 03/26/2020 03/13/20   Allie Bossier, MD  guaiFENesin-dextromethorphan [facility] DM) 100-10 MG/5ML syrup Take 5 mLs by mouth every 4 (four) hours as needed for cough. Patient taking differently: Take 10 mLs by mouth every 8 (eight) hours as needed for cough. 03/13/20   Allie Bossier, MD  Insulin  Pen Needle 29G X 12.7MM MISC Use as directed 03/13/20   Allie Bossier, MD     Vital Signs: BP (!) 141/66 (BP Location: Left Arm)   Pulse 79   Temp 98.4 F (36.9 C) (Oral)   Resp 16   Ht 5\' 6"  (1.676 m)   Wt 135 lb (61.2 kg)   SpO2 95%   BMI 21.79 kg/m   Physical Exam Constitutional:      General: He is not in acute distress. Pulmonary:     Effort: Pulmonary effort is normal.   Abdominal:     Palpations: Abdomen is soft.     Tenderness: There is no abdominal tenderness.     Comments: Gastrostomy tube in place, dressing is clean and dry. Tube feeds currently infusing.   Skin:    General: Skin is warm and dry.  Neurological:     Mental Status: He is alert and oriented to person, place, and time.     Imaging: IR GASTROSTOMY TUBE MOD SED  Result Date: 03/29/2020 INDICATION: Dysphagia resulting in protein calorie malnutrition EXAM: Fluoroscopically guided placement of percutaneous pull-through gastrostomy tube Interventional Radiologist:  Criselda Peaches, MD MEDICATIONS: 2 g Ancef; 1 mg glucagon antibiotics were administered within 1 hour of the procedure. ANESTHESIA/SEDATION: Versed 1.5 mg IV; Fentanyl 50 mcg IV Moderate Sedation Time:  20 minutes The patient was continuously monitored during the procedure by the interventional radiology nurse under my direct supervision. CONTRAST:  10mL OMNIPAQUE IOHEXOL 300 MG/ML  SOLN FLUOROSCOPY TIME:  Fluoroscopy Time: 4 minutes 12 seconds (24 mGy). COMPLICATIONS: None immediate. PROCEDURE: Informed written consent was obtained from the patient after a thorough discussion of the procedural risks, benefits and alternatives. All questions were addressed. Maximal Sterile Barrier Technique was utilized including caps, mask, sterile gowns, sterile gloves, sterile drape, hand hygiene and skin antiseptic. A timeout was performed prior to the initiation of the procedure. Maximal barrier sterile technique utilized including caps, mask, sterile gowns, sterile gloves, large sterile drape, hand hygiene, and chlorhexadine skin prep. An angled catheter was advanced over a wire under fluoroscopic guidance through the nose, down the esophagus and into the body of the stomach. The stomach was then insufflated with several 100 ml of air. Fluoroscopy confirmed location of the gastric bubble, as well as inferior displacement of the barium stained colon.  Under direct fluoroscopic guidance, a single T-tack was placed, and the anterior gastric wall drawn up against the anterior abdominal wall. Percutaneous access was then obtained into the mid gastric body with an 18 gauge sheath needle. Aspiration of air, and injection of contrast material under fluoroscopy confirmed needle placement. An Amplatz wire was advanced in the gastric body and the access needle exchanged for a 9-French vascular sheath. A snare device was advanced through the vascular sheath and an Amplatz wire advanced through the angled catheter. The Amplatz wire was successfully snared and this was pulled up through the esophagus and out the mouth. A 20-French Alinda Dooms MIC-PEG tube was then connected to the snare and pulled through the mouth, down the esophagus, into the stomach and out to the anterior abdominal wall. Hand injection of contrast material confirmed intragastric location. The T-tack retention suture was then cut. The pull through peg tube was then secured with the external bumper and capped. The patient will be observed for several hours with the newly placed tube on low wall suction to evaluate for any post procedure complication. The patient tolerated the procedure well, there is no immediate complication.  IMPRESSION: Successful placement of a 20 French pull through gastrostomy tube. Electronically Signed   By: Jacqulynn Cadet M.D.   On: 03/29/2020 13:26    Labs:  CBC: Recent Labs    03/27/20 0415 03/28/20 0546 03/29/20 0447 03/30/20 0514  WBC 8.5 6.0 6.9 8.2  HGB 8.3* 8.8* 9.1* 9.1*  HCT 27.0* 28.6* 29.0* 28.9*  PLT 307 290 312 295    COAGS: Recent Labs    03/07/20 1156  INR 1.1    BMP: Recent Labs    03/27/20 0415 03/28/20 0546 03/29/20 0447 03/30/20 0514  NA 149* 150* 146* 144  K 3.5 3.2* 3.0* 3.4*  CL 117* 118* 114* 113*  CO2 20* 21* 22 22  GLUCOSE 78 99 161* 110*  BUN 27* 27* 20 18  CALCIUM 7.7* 7.7* 7.6* 7.7*  CREATININE 1.66* 1.58*  1.39* 1.43*  GFRNONAA 40* 43* 50* 48*    LIVER FUNCTION TESTS: Recent Labs    03/12/20 0326 03/13/20 0255 03/26/20 1049 03/27/20 0415  BILITOT 0.8 0.8 0.9 0.7  AST 24 26 28 17   ALT 12 14 31 21   ALKPHOS 60 67 83 68  PROT 6.1* 6.5 6.7 5.8*  ALBUMIN 2.8* 3.0* 2.8* 2.5*    Assessment and Plan:  Dysphagia s/p gastrostomy tube 03/29/20: Site is clean and dry, patient denies any tenderness around the site. Tube feeds infusing without difficulty.   Please call IR with any questions.   Electronically Signed: Soyla Dryer, AGACNP-BC [phone] 03/30/2020, 12:06 PM   I spent a total of 15 Minutes at the the patient's bedside AND on the patient's hospital floor or unit, greater than 50% of which was counseling/coordinating care for gastrostomy tube placement evaluation.

## 2020-03-30 NOTE — TOC Progression Note (Signed)
Transition of Care [facility]) - Progression Note    Patient Details  Name: John Walton MRN: [id number] Date of Birth: [birth year]  Transition of [facility]) CM/SW Contact  Lennart Pall, LCSW Phone Number: 03/30/2020, 4:08 PM  Clinical Narrative:    TOC continues to follow along and have touched base with wife today to let her know will assist with return to [facility] when medically ready.     Expected Discharge Plan: [place] Barriers to Discharge: Continued Medical Work up  Expected Discharge Plan and Services Expected Discharge Plan: [place] Acute Care Choice: Resumption of Svcs/PTA Provider Living arrangements for the past 2 months: Single Family Home                                       Social Determinants of Health (SDOH) Interventions    Readmission Risk Interventions No flowsheet data found.

## 2020-03-30 NOTE — Progress Notes (Signed)
PROGRESS NOTE    John Walton  HEN:[id number] DOB: 1932-06-13 DOA: 03/26/2020 PCP: [name], MD   Chief Complaint  Patient presents with  . Dysphagia    Brief Narrative:  Patient is a 86 year old male with history of hypertension, hyperlipidemia, dysphagia who presented from [place] facility for the evaluation of dysphagia, fever.  He recently had hip replacement and is currently residing in a skilled nursing facility.  He is mostly bedbound since last few weeks.  Most of the information was taken from his wife.  As per the wife, he had swallowing issues about 10 years ago which automatically resolved after he was giving some antiallergic medication/Benadryl.  He had this issue again few weeks ago and he was worked up extensively at [facility].  He underwent EGD, swallowing evaluation; the studies were largely unremarkable except for mild esophagitis.  He was then sent back to skilled nursing facility. GI/speech following here.  Patient and family are interested on PEG placement if he is found to have persistent dysphagia.    Assessment & Plan:   Principal Problem:   Dysphagia Active Problems:   Essential hypertension   Hyperlipidemia   CKD (chronic kidney disease), stage IV (HCC)   Normocytic anemia   Protein-calorie malnutrition, severe   Hypokalemia   Hypernatremia   DM (diabetes mellitus), type 2 (HCC)   Fever   Debility  1 dysphagia Patient noted to have some intermittent dysphagia over the past 10 years.  Questionable etiology.  Likely multifactorial and could be from osteophyte at C5-C6, poor esophageal motility, esophagitis.   Patient noted to have recently been admitted at [facility] and had extensive work-up there.  Speech evaluation, EGD.  Work-up largely negative except for some mild esophagitis, gastritis, duodenitis.  Speech therapy following and planning a modified barium swallow.  It was noted that  patient and family were interested in PEG placement if patient was to have persistent dysphagia or if recommended by GI.  Patient placed on clear liquids however per RN not tolerating clear liquids and only ice chips and hesitant for oral medications to be given.  Patient seen by GI who recommended PEG tube placement as they feel patient would not do well with a trial of p.o.'s and feel this will delay intervention which he most needs at this time.  Patient status post PEG tube placement (03/29/2020 ).  Tube feeds started today.  SLP following and likely MBS to be done in the next 1 to 2 days.  GI following and appreciate input and recommendations.   2.  Chronic indwelling Foley catheter Patient was discharged to rehab with Foley catheter from [facility].  Patient recommended to follow-up with urology at [facility] on discharge.  Flomax started.  Continue Foley catheter with outpatient follow-up with urology.  3.  DISH CT head and neck was ordered to evaluate for other potential causes at [facility].  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  Same finding noted on imaging in [place] in 2011.  Spinal surgery was consulted at [facility] and concluded no safe way for removal of osteophyte, so nonsurgical management was recommended.  Outpatient follow-up.  4.  Report of fever Currently afebrile.  Aspiration pneumonia suspected by chest x-ray negative.  Currently on room air.  Afebrile.  Monitor for now.  5.  Hypernatremia Secondary to dehydration.  Sodium levels improving.  Change IV fluids to half-normal saline.  Follow.    6.  Severe protein calorie malnutrition Secondary to problem #1.  Status post PEG tube placement.  Tube feeds to be started today.  Dietitian consulted and following.    7.  Hyperlipidemia Noted to be on a statin at home prior to admission.  8.  Hypokalemia Potassium currently at 3.4.   Phosphorus at 2.4.  K-Phos 30 mmol IV x1.  Potassium 20 mEq per tube x1.  Follow.   9.  Chronic kidney disease stage IV Noted to have been evaluated by nephrology at [facility].  Creatinine at baseline.  Outpatient follow-up with nephrology, Dr.Igwemezie.  10.  Normocytic anemia/iron deficiency anemia Likely secondary to poor oral intake.  Patient with no overt bleeding.  Anemia panel consistent with low iron.  Status post IV iron.  Hemoglobin currently stable at 9.1.  Will need oral iron supplementation on discharge.  Transfusion threshold hemoglobin <7.  Follow H&H.    11.  Debility/deconditioning Patient recently hospitalized after mechanical fall with left hip fracture requiring surgery 03/08/2020 and discharged to SNF.  PT OT following and recommending SNF.  12.  Diabetes mellitus type 2 Hemoglobin A1c 6.7 (03/07/2020).  CBG 112 this morning.  Change IV fluids to normal saline.  Sliding scale insulin.   DVT prophylaxis: Heparin Code Status: Full Family Communication: Updated patient and wife at bedside. Disposition:   Status is: Inpatient    Dispo: The patient is from: Home              Anticipated d/c is to: SNF              Anticipated d/c date is: 3 to 4 days.              Patient s/pe G-tube placement and initiation of tube feeds and further work-up of dysphagia pending.  Not stable for discharge.       Consultants:   Wound care Cottie Banda 03/27/2020  Gastroenterology: Dr. Cristina Gong 03/28/2020  Interventional radiology Dr. Laurence Ferrari 03/29/2020  Procedures:  Gastrostomy tube placement per IR 03/29/2020  Chest x-ray 03/26/2020  Antimicrobials:   None   Subjective: Patient laying in bed.  States he feels lousy.  Denies any chest pain or shortness of breath.  No abdominal pain.  No nausea or vomiting.  Stated he tried some ice chips this morning however it would not go down and so he spit it back up.  Wife at bedside.  Per RN patient with  complaints of constipation.  Objective: Vitals:   03/29/20 1400 03/29/20 1435 03/29/20 2200 03/30/20 0530  BP: (!) 139/54 (!) 136/49 (!) 155/60 (!) 141/66  Pulse: 72 79 77 79  Resp: 18 16 15 16   Temp: 98.6 F (37 C) 97.7 F (36.5 C) 98.4 F (36.9 C) 98.4 F (36.9 C)  TempSrc: Oral Oral Oral Oral  SpO2: 99% 96% 100% 95%  Weight:      Height:        Intake/Output Summary (Last 24 hours) at 03/30/2020 1126 Last data filed at 03/30/2020 0600 Gross per 24 hour  Intake 875.46 ml  Output 1150 ml  Net -274.54 ml   Filed Weights   03/26/20 1727  Weight: 61.2 kg    Examination:  General exam: Chronically ill-appearing. Respiratory system: CTAB.  No wheezes, no crackles, no rhonchi.  Normal respiratory effort.  Cardiovascular system: Regular rate rhythm no murmurs rubs or gallops.  No JVD.  No lower extremity edema.  Gastrointestinal system: Abdomen is soft, nontender, nondistended, positive bowel sounds.  PEG tube in place.  Central nervous system: Alert and oriented. No focal neurological deficits. Extremities: Symmetric 5 x 5 power. Skin: No rashes, lesions or ulcers Psychiatry: Judgement and insight appear normal. Mood & affect appropriate.     Data Reviewed: I have personally reviewed following labs and imaging studies  CBC: Recent Labs  Lab 03/26/20 1049 03/26/20 1833 03/27/20 0415 03/28/20 0546 03/29/20 0447 03/30/20 0514  WBC 13.3* 9.5 8.5 6.0 6.9 8.2  NEUTROABS 11.8*  --   --  4.5 5.3  --   HGB 9.8* 9.2* 8.3* 8.8* 9.1* 9.1*  HCT 31.2* 30.4* 27.0* 28.6* 29.0* 28.9*  MCV 91.2 94.1 93.8 93.2 90.9 91.7  PLT 415* 383 307 290 312 425    Basic Metabolic Panel: Recent Labs  Lab 03/26/20 1049 03/26/20 1833 03/27/20 0415 03/28/20 0546 03/29/20 0447 03/30/20 0514  NA 146*  --  149* 150* 146* 144  K 3.8  --  3.5 3.2* 3.0* 3.4*  CL 112*  --  117* 118* 114* 113*  CO2 21*  --  20* 21* 22 22  GLUCOSE 86  --  78 99 161* 110*  BUN 33*  --  27* 27* 20 18   CREATININE 1.90* 1.70* 1.66* 1.58* 1.39* 1.43*  CALCIUM 8.2*  --  7.7* 7.7* 7.6* 7.7*  MG  --   --   --   --  1.9 1.9  PHOS  --   --   --   --   --  2.4*    GFR: Estimated Creatinine Clearance: 32.7 mL/min (A) (by C-G formula based on SCr of 1.43 mg/dL (H)).  Liver Function Tests: Recent Labs  Lab 03/26/20 1049 03/27/20 0415  AST 28 17  ALT 31 21  ALKPHOS 83 68  BILITOT 0.9 0.7  PROT 6.7 5.8*  ALBUMIN 2.8* 2.5*    CBG: Recent Labs  Lab 03/29/20 0746 03/29/20 1256 03/29/20 1613 03/29/20 2204 03/30/20 0731  GLUCAP 153* 178* 106* 86 112*     Recent Results (from the past 240 hour(s))  Resp Panel by RT-PCR (Flu A&B, Covid) Nasopharyngeal Swab     Status: None   Collection Time: 03/26/20 10:49 AM   Specimen: Nasopharyngeal Swab; Nasopharyngeal(NP) swabs in vial transport medium  Result Value Ref Range Status   SARS Coronavirus 2 by RT PCR NEGATIVE NEGATIVE Final    Comment: (NOTE) SARS-CoV-2 target nucleic acids are NOT DETECTED.  The SARS-CoV-2 RNA is generally detectable in upper respiratory specimens during the acute phase of infection. The lowest concentration of SARS-CoV-2 viral copies this assay can detect is 138 copies/mL. A negative result does not preclude SARS-Cov-2 infection and should not be used as the sole basis for treatment or other patient management decisions. A negative result may occur with  improper specimen collection/handling, submission of specimen other than nasopharyngeal swab, presence of viral mutation(s) within the areas targeted by this assay, and inadequate number of viral copies(<138 copies/mL). A negative result must be combined with clinical observations, patient history, and epidemiological information. The expected result is Negative.  Fact Sheet for Patients:  EntrepreneurPulse.com.au  Fact Sheet for Healthcare Providers:  IncredibleEmployment.be  This test is no t yet approved or  cleared by the [place] FDA and  has been authorized for detection and/or diagnosis of SARS-CoV-2 by FDA under an Emergency Use Authorization (EUA). This EUA will remain  in effect (meaning this test can be used) for the duration  of the COVID-19 declaration under Section 564(b)(1) of the Act, 21 U.S.C.section 360bbb-3(b)(1), unless the authorization is terminated  or revoked sooner.       Influenza A by PCR NEGATIVE NEGATIVE Final   Influenza B by PCR NEGATIVE NEGATIVE Final    Comment: (NOTE) The Xpert Xpress SARS-CoV-2/FLU/RSV plus assay is intended as an aid in the diagnosis of influenza from Nasopharyngeal swab specimens and should not be used as a sole basis for treatment. Nasal washings and aspirates are unacceptable for Xpert Xpress SARS-CoV-2/FLU/RSV testing.  Fact Sheet for Patients: EntrepreneurPulse.com.au  Fact Sheet for Healthcare Providers: IncredibleEmployment.be  This test is not yet approved or cleared by the [place] FDA and has been authorized for detection and/or diagnosis of SARS-CoV-2 by FDA under an Emergency Use Authorization (EUA). This EUA will remain in effect (meaning this test can be used) for the duration of the COVID-19 declaration under Section 564(b)(1) of the Act, 21 U.S.C. section 360bbb-3(b)(1), unless the authorization is terminated or revoked.  Performed at [facility], [place] [address], [place], [place] [address]   Culture, blood (routine x 2)     Status: Abnormal   Collection Time: 03/26/20  6:33 PM   Specimen: BLOOD  Result Value Ref Range Status   Specimen Description   Final    BLOOD LEFT HAND Performed at [place] [address] [place], [place] [address]    Special Requests   Final    BOTTLES DRAWN AEROBIC ONLY Blood Culture adequate volume Performed at [facility] [address], [place], [place] [address]    Culture   Setup Time   Final    GRAM POSITIVE COCCI AEROBIC BOTTLE ONLY CRITICAL RESULT CALLED TO, READ BACK BY AND VERIFIED WITH: PHARMD E JACKSON 03/27/20 AT 2213 SK    Culture (A)  Final    ROTHIA MUCILAGINOSA Standardized susceptibility testing for this organism is not available. Performed at [facility], [place] [address] [place], [place] [address]    Report Status 03/29/2020 FINAL  Final  Culture, blood (routine x 2)     Status: None (Preliminary result)   Collection Time: 03/26/20  6:33 PM   Specimen: BLOOD  Result Value Ref Range Status   Specimen Description   Final    BLOOD LEFT ANTECUBITAL Performed at [facility] [address], [place], [place] [address]    Special Requests   Final    BOTTLES DRAWN AEROBIC ONLY Blood Culture adequate volume Performed at [place] [address] [place], [place] [address]    Culture   Final    NO GROWTH 4 DAYS Performed at [facility], [place] [address] [place], [place] [address]    Report Status PENDING  Incomplete  Blood Culture ID Panel (Reflexed)     Status: None   Collection Time: 03/26/20  6:33 PM  Result Value Ref Range Status   Enterococcus faecalis NOT DETECTED NOT DETECTED Final   Enterococcus Faecium NOT DETECTED NOT DETECTED Final   Listeria monocytogenes NOT DETECTED NOT DETECTED Final   Staphylococcus species NOT DETECTED NOT DETECTED Final   Staphylococcus aureus (BCID) NOT DETECTED NOT DETECTED Final   Staphylococcus epidermidis NOT DETECTED NOT DETECTED Final   Staphylococcus lugdunensis NOT DETECTED NOT DETECTED Final   Streptococcus species NOT DETECTED NOT DETECTED Final   Streptococcus agalactiae NOT DETECTED NOT DETECTED Final   Streptococcus pneumoniae NOT DETECTED NOT DETECTED Final   Streptococcus  pyogenes NOT DETECTED NOT DETECTED Final   A.calcoaceticus-baumannii NOT DETECTED NOT DETECTED Final   Bacteroides fragilis NOT DETECTED NOT DETECTED Final    Enterobacterales NOT DETECTED NOT DETECTED Final   Enterobacter cloacae complex NOT DETECTED NOT DETECTED Final   Escherichia coli NOT DETECTED NOT DETECTED Final   Klebsiella aerogenes NOT DETECTED NOT DETECTED Final   Klebsiella oxytoca NOT DETECTED NOT DETECTED Final   Klebsiella pneumoniae NOT DETECTED NOT DETECTED Final   Proteus species NOT DETECTED NOT DETECTED Final   Salmonella species NOT DETECTED NOT DETECTED Final   Serratia marcescens NOT DETECTED NOT DETECTED Final   Haemophilus influenzae NOT DETECTED NOT DETECTED Final   Neisseria meningitidis NOT DETECTED NOT DETECTED Final   Pseudomonas aeruginosa NOT DETECTED NOT DETECTED Final   Stenotrophomonas maltophilia NOT DETECTED NOT DETECTED Final   Candida albicans NOT DETECTED NOT DETECTED Final   Candida auris NOT DETECTED NOT DETECTED Final   Candida glabrata NOT DETECTED NOT DETECTED Final   Candida krusei NOT DETECTED NOT DETECTED Final   Candida parapsilosis NOT DETECTED NOT DETECTED Final   Candida tropicalis NOT DETECTED NOT DETECTED Final   Cryptococcus neoformans/gattii NOT DETECTED NOT DETECTED Final    Comment: Performed at [facility], [address], [place], [place] [address]         Radiology Studies: IR GASTROSTOMY TUBE MOD SED  Result Date: 03/29/2020 INDICATION: Dysphagia resulting in protein calorie malnutrition EXAM: Fluoroscopically guided placement of percutaneous pull-through gastrostomy tube Interventional Radiologist:  Criselda Peaches, MD MEDICATIONS: 2 g Ancef; 1 mg glucagon antibiotics were administered within 1 hour of the procedure. ANESTHESIA/SEDATION: Versed 1.5 mg IV; Fentanyl 50 mcg IV Moderate Sedation Time:  20 minutes The patient was continuously monitored during the procedure by the interventional radiology nurse under my direct supervision. CONTRAST:  60mL OMNIPAQUE IOHEXOL 300 MG/ML  SOLN FLUOROSCOPY TIME:  Fluoroscopy Time: 4 minutes 12 seconds (24 mGy). COMPLICATIONS:  None immediate. PROCEDURE: Informed written consent was obtained from the patient after a thorough discussion of the procedural risks, benefits and alternatives. All questions were addressed. Maximal Sterile Barrier Technique was utilized including caps, mask, sterile gowns, sterile gloves, sterile drape, hand hygiene and skin antiseptic. A timeout was performed prior to the initiation of the procedure. Maximal barrier sterile technique utilized including caps, mask, sterile gowns, sterile gloves, large sterile drape, hand hygiene, and chlorhexadine skin prep. An angled catheter was advanced over a wire under fluoroscopic guidance through the nose, down the esophagus and into the body of the stomach. The stomach was then insufflated with several 100 ml of air. Fluoroscopy confirmed location of the gastric bubble, as well as inferior displacement of the barium stained colon. Under direct fluoroscopic guidance, a single T-tack was placed, and the anterior gastric wall drawn up against the anterior abdominal wall. Percutaneous access was then obtained into the mid gastric body with an 18 gauge sheath needle. Aspiration of air, and injection of contrast material under fluoroscopy confirmed needle placement. An Amplatz wire was advanced in the gastric body and the access needle exchanged for a 9-French vascular sheath. A snare device was advanced through the vascular sheath and an Amplatz wire advanced through the angled catheter. The Amplatz wire was successfully snared and this was pulled up through the esophagus and out the mouth. A 20-French Alinda Dooms MIC-PEG tube was then connected to the snare and pulled through the mouth, down the esophagus, into the stomach and out to the anterior abdominal wall. Hand injection  of contrast material confirmed intragastric location. The T-tack retention suture was then cut. The pull through peg tube was then secured with the external bumper and capped. The patient will be  observed for several hours with the newly placed tube on low wall suction to evaluate for any post procedure complication. The patient tolerated the procedure well, there is no immediate complication. IMPRESSION: Successful placement of a 20 French pull through gastrostomy tube. Electronically Signed   By: Jacqulynn Cadet M.D.   On: 03/29/2020 13:26        Scheduled Meds: . (feeding supplement) PROSource Plus  30 mL Oral BID WC  . alum & mag hydroxide-simeth  30 mL Oral Once   And  . lidocaine  15 mL Oral Once  . Chlorhexidine Gluconate Cloth  6 each Topical Daily  . feeding supplement  1 Container Oral TID BM  . heparin  5,000 Units Subcutaneous Q8H  . insulin aspart  0-5 Units Subcutaneous QHS  . insulin aspart  0-9 Units Subcutaneous TID WC  . latanoprost  1 drop Both Eyes QHS  . multivitamin  15 mL Oral Daily  . pantoprazole (PROTONIX) IV  40 mg Intravenous Q12H  . polyvinyl alcohol  1 drop Both Eyes TID  . sucralfate  1 g Oral TID WC & HS  . tamsulosin  0.4 mg Oral Daily   Continuous Infusions: . dextrose 5 % and 0.45% NaCl 75 mL/hr at 03/30/20 0630  . feeding supplement (OSMOLITE 1.5 CAL) 1,000 mL (03/30/20 0819)  . potassium PHOSPHATE IVPB (in mmol) 30 mmol (03/30/20 1117)     LOS: 4 days    Time spent: 40 minutes    Irine Seal, MD [facility]   To contact the attending provider between 7A-7P or the covering provider during after hours 7P-7A, please log into the web site [email or web address] and access using universal [place] password for that web site. If you do not have the password, please call the hospital operator.  03/30/2020, 11:26 AM

## 2020-03-30 NOTE — Progress Notes (Signed)
  Speech Language Pathology Treatment: Dysphagia  Patient Details Name: John Walton MRN: [id number] DOB: 03/27/1933 Today's Date: 03/30/2020 Time: 3151-7616 SLP Time Calculation (min) (ACUTE ONLY): 31 min  Assessment / Plan / Recommendation Clinical Impression  SLP follow up to educate pt to plan to hold on MBS until later in hopsitalization to allow maximal nutritional benefit/potential improvement.  Pt agreeable to plan.    Pt willing to brush his teeth and take in a few single ice chips (x5) with full assist for ice chip administration.  Pt states "It's not helping me anymore".  Educated him to importance to continue ice chips intake to decrease disuse muscle atrophy and to help thin secretions.  Pt coughing with water swish and expectorate after dental brushing and with ice chips.  Fortunately his cough is strong and he was able to cough and expectorate secretions.  He is at least partially sensate to his dysphagia and thus uses caution with intake.  Using teach back, pt informed to importance of oral care and ice chip consumption.  Prognosis for swallow to improve to functional level for adequate nutrition is guarded given chronicity and ongoing dysphagia.    Pt desires cough drops as she complains of sore throat = advised RN.  Will follow up.    HPI HPI: 86 yo male with complex med hx including dysphagia due to DISH, esophagitis, recent hip fx s/p surgery at [facility] 03/08/2020 and was tx to SNF for rehab.  Pt then admitted to [facility] hospital 12/15-12/23 from [place] where he was treated for dysphagia and underwent EGD (esophagitis), MBSS (rec dys and nectar) and was treated with Benadryl and SoluMedrol.  Pt now admitted with dysphagia, dehydration with poor po intake.  Wife and pt report, pt took Robitussin and Tylenol (wife states occurred at [facility] when there for rehab) and within one hour pt says he "could not swallow". CXR negative.  Speech/swallow eval ordered.  Pt and wife state  that he needs the medications he received from [place] 10 years ago that resolved his dysphagia. SLP informed pt and wife to changes of swallowing with age and this would be expounded with pt's baseline dysphagia from his DISH *osteophyte C5-C6.  Pt is s/p feeding tube placement on 12/29 and starting tube feeding 12/30.  Follow up to assess readiness for MBSS.      SLP Plan  Continue with current plan of care;MBS       Recommendations  Diet recommendations: NPO;Other(comment) (ICE CHIPS) Medication Administration: Via alternative means Compensations: Multiple dry swallows after each bite/sip (expectorate as needed) Postural Changes and/or Swallow Maneuvers: Seated upright 90 degrees;Upright 30-60 min after meal                Oral Care Recommendations: Other (Comment) (oral care TID) Follow up Recommendations: Skilled Nursing facility SLP Visit Diagnosis: Dysphagia, pharyngeal phase (R13.13);Dysphagia, pharyngoesophageal phase (R13.14) Plan: Continue with current plan of care;MBS       GO                John Walton 03/30/2020, 10:08 AM

## 2020-03-30 NOTE — Progress Notes (Signed)
[facility] Gastroenterology Progress Note  John Walton 86 y.o. [birth year]  CC:  Dysphagia  Subjective: Patient reports continued weakness.  Is currently on ice chips.  Had IR-placed gastrostomy tube. Denies any abdominal pain.  Denies nausea or vomiting.  ROS : Review of Systems  Cardiovascular: Negative for chest pain and palpitations.  Gastrointestinal: Negative for abdominal pain, blood in stool, constipation, diarrhea, heartburn, melena, nausea and vomiting.   Objective: Vital signs in last 24 hours: Vitals:   03/29/20 2200 03/30/20 0530  BP: (!) 155/60 (!) 141/66  Pulse: 77 79  Resp: 15 16  Temp: 98.4 F (36.9 C) 98.4 F (36.9 C)  SpO2: 100% 95%    Physical Exam:  General:  Lethargic, thin, chronically ill-appearing  Head:  Normocephalic, without obvious abnormality, atraumatic  Eyes:  Anicteric sclera, EOMs intact  Lungs:   Respirations unlabored  Heart:  Regular rate and rhythm  Abdomen:   Soft, non-tender, non-distended; gastrostomy tube in place   Extremities: Extremities normal, atraumatic, no  edema  Pulses: 2+ and symmetric    Lab Results: Recent Labs    03/29/20 0447 03/30/20 0514  NA 146* 144  K 3.0* 3.4*  CL 114* 113*  CO2 22 22  GLUCOSE 161* 110*  BUN 20 18  CREATININE 1.39* 1.43*  CALCIUM 7.6* 7.7*  MG 1.9 1.9  PHOS  --  2.4*   No results for input(s): AST, ALT, ALKPHOS, BILITOT, PROT, ALBUMIN in the last 72 hours. Recent Labs    03/28/20 0546 03/29/20 0447 03/30/20 0514  WBC 6.0 6.9 8.2  NEUTROABS 4.5 5.3  --   HGB 8.8* 9.1* 9.1*  HCT 28.6* 29.0* 28.9*  MCV 93.2 90.9 91.7  PLT 290 312 295   No results for input(s): LABPROT, INR in the last 72 hours.    Assessment: Dysphagia: likely multifactorial.  Osteophyte with esophageal compression, likely poor esophageal motility in the setting of debility, and esophagitis. -EGD 03/17/20: mild esophagitis, gastritis, duodenitis (bx: negative for H pylori)  Normocytic anemia: Hgb 9.1,  stable.  Low iron and saturation, as well as low TIBC. Normal ferritin.  Possibly anemia of chronic disease. No overt blood loss.    Poor oral intake, debility  CKD   Plan: Continue Protonix BID.  Trial of Carafate PO if patient able to swallow liquids (otherwise, OK to discontinue Carafate).  Await speech swallow study.  Diet as per speech recommendations.  Salley Slaughter PA-C 03/30/2020, 11:44 AM  Contact #  [phone]

## 2020-03-30 NOTE — Consult Note (Signed)
   [facility] Inpatient Consult   03/30/2020  John Walton 1932/08/20 [id number]   Patient chart has been reviewed for readmissions less than 30 days and for high risk score for unplanned readmissions. Patient assessed for community [name] follow up needs.   Chart review reveals referral received by [name] from [name] discharge call on 12/22 but patient was at SNF at the time, not home. No contact was made by [name].   Plan: Will continue to follow for progression and disposition plans.   Of note, [facility] Care Management services does not replace or interfere with any services that are arranged by inpatient case management or social work.  [name], MSN, [facility] Liaison Nurse Mobile Phone [phone]  Toll free office [phone]

## 2020-03-31 DIAGNOSIS — N184 Chronic kidney disease, stage 4 (severe): Secondary | ICD-10-CM | POA: Diagnosis not present

## 2020-03-31 DIAGNOSIS — E119 Type 2 diabetes mellitus without complications: Secondary | ICD-10-CM | POA: Diagnosis not present

## 2020-03-31 DIAGNOSIS — R1310 Dysphagia, unspecified: Secondary | ICD-10-CM | POA: Diagnosis not present

## 2020-03-31 DIAGNOSIS — R5381 Other malaise: Secondary | ICD-10-CM | POA: Diagnosis not present

## 2020-03-31 LAB — CBC
HCT: 25.3 % — ABNORMAL LOW (ref 39.0–52.0)
Hemoglobin: 8 g/dL — ABNORMAL LOW (ref 13.0–17.0)
MCH: 28.8 pg (ref 26.0–34.0)
MCHC: 31.6 g/dL (ref 30.0–36.0)
MCV: 91 fL (ref 80.0–100.0)
Platelets: 241 10*3/uL (ref 150–400)
RBC: 2.78 MIL/uL — ABNORMAL LOW (ref 4.22–5.81)
RDW: 13.5 % (ref 11.5–15.5)
WBC: 8.6 10*3/uL (ref 4.0–10.5)
nRBC: 0 % (ref 0.0–0.2)

## 2020-03-31 LAB — CULTURE, BLOOD (ROUTINE X 2)
Culture: NO GROWTH
Special Requests: ADEQUATE

## 2020-03-31 LAB — BASIC METABOLIC PANEL
Anion gap: 6 (ref 5–15)
BUN: 19 mg/dL (ref 8–23)
CO2: 22 mmol/L (ref 22–32)
Calcium: 7.2 mg/dL — ABNORMAL LOW (ref 8.9–10.3)
Chloride: 111 mmol/L (ref 98–111)
Creatinine, Ser: 1.57 mg/dL — ABNORMAL HIGH (ref 0.61–1.24)
GFR, Estimated: 43 mL/min — ABNORMAL LOW (ref >60)
Glucose, Bld: 232 mg/dL — ABNORMAL HIGH (ref 70–99)
Potassium: 3.7 mmol/L (ref 3.5–5.1)
Sodium: 139 mmol/L (ref 135–145)

## 2020-03-31 LAB — MAGNESIUM: Magnesium: 2.1 mg/dL (ref 1.7–2.4)

## 2020-03-31 LAB — PHOSPHORUS: Phosphorus: 2.4 mg/dL — ABNORMAL LOW (ref 2.5–4.6)

## 2020-03-31 LAB — GLUCOSE, CAPILLARY: Glucose-Capillary: 217 mg/dL — ABNORMAL HIGH (ref 70–99)

## 2020-03-31 MED ORDER — POLYETHYLENE GLYCOL 3350 17 G PO PACK: 17.0000 g | PACK | Freq: Every day | ORAL | Status: DC | PRN

## 2020-03-31 MED ORDER — POTASSIUM PHOSPHATES 15 MMOLE/5ML IV SOLN
30.0000 mmol | Freq: Once | INTRAVENOUS | Status: AC
  Administered 2020-03-31: 30 mmol via INTRAVENOUS
  Filled 2020-03-31: qty 10

## 2020-03-31 NOTE — Progress Notes (Signed)
Occupational Therapy Treatment Patient Details Name: John Walton MRN: [id number] DOB: [birth year] Today's Date: 03/31/2020    History of present illness 86 year old male with history of hypertension, hyperlipidemia, dysphagia who presented from [facility] skilled nursing facility for the evaluation of dysphagia, fever.   OT comments  Pt. Seen for skilled OT treatment session.  Pt. Reports fatigue and weakness but agreeable to bed level grooming task.  Required assistance for washing face and cues to complete the task.  Bed mobility max A rolling L/R for clean up and repositioning following bowel incontinence.  Able to provide some assistance with B ues pulling on bed rails but limited due to multiple reports of feeling "weak".  Pt. Closing eyes and falling back asleep at end of session.  Will continue to follow along with acute OT goals with focus on increasing bed mobility to aide in participation and completion of adls as able.    Follow Up Recommendations  SNF    Equipment Recommendations  Other (comment)    Recommendations for Other Services      Precautions / Restrictions Precautions Precautions: Fall;Posterior Hip Precaution Comments: pt does not recall THP Restrictions Weight Bearing Restrictions: No LLE Weight Bearing: Weight bearing as tolerated Other Position/Activity Restrictions: WBAT       Mobility Bed Mobility Overal bed mobility: Needs Assistance Bed Mobility: Rolling Rolling: Max assist         General bed mobility comments: pt. assisted with rolling multiple times in bed L/R for pericare.  cues for hand placement on rails. less physical assistance to roll R vs. L.  utilizied pad to guide pt. towards left.  at end of session utilized bed function of boost and pads to pull up pt. in bed as he reports he was "too weak" from last nights incontinence events  Transfers                      Balance                                            ADL either performed or assessed with clinical judgement   ADL Overall ADL's : Needs assistance/impaired     Grooming: Maximal assistance;Bed level;Wash/dry face Grooming Details (indicate cue type and reason): cues for completion, intial attempts but then stopped before he was finished wiping around his mouth                     Toileting- Clothing Manipulation and Hygiene: Bed level;Total assistance Toileting - Clothing Manipulation Details (indicate cue type and reason): pt. found to be incontinent of bowel upon arrival into room.  assisted with clean up and changing pad and gown       General ADL Comments: cues and assistance for bed level grooming task.  total a for clean up after bowel incontinence. pt. was able to assist some with bed mobility.  reports he "had a rough night" states multiple episodes of bowel incontinence.     Vision       Perception     Praxis      Cognition Arousal/Alertness: Lethargic Behavior During Therapy: [facility] for tasks assessed/performed;Flat affect  Exercises     Shoulder Instructions       General Comments      Pertinent Vitals/ Pain       Pain Assessment: No/denies pain  Home Living                                          Prior Functioning/Environment              Frequency  Min 2X/week        Progress Toward Goals  OT Goals(current goals can now be found in the care plan section)  Progress towards OT goals: Progressing toward goals     Plan      Co-evaluation                 AM-PAC OT "6 Clicks" Daily Activity     Outcome Measure   Help from another person eating meals?: Total Help from another person taking care of personal grooming?: A Lot Help from another person toileting, which includes using toliet, bedpan, or urinal?: Total Help from another person bathing (including washing, rinsing, drying)?:  Total Help from another person to put on and taking off regular upper body clothing?: Total Help from another person to put on and taking off regular lower body clothing?: Total 6 Click Score: 7    End of Session    OT Visit Diagnosis: Unsteadiness on feet (R26.81);Muscle weakness (generalized) (M62.81);Pain;History of falling (Z91.81)   Activity Tolerance Patient limited by lethargy   Patient Left in bed;with call bell/phone within reach;with bed alarm set   Nurse Communication          Time: 1157-2620 OT Time Calculation (min): 19 min  Charges: OT General Charges $OT Visit: 1 Visit OT Treatments $Self Care/Home Management : 8-22 mins  Sonia Baller, COTA/L Acute Rehabilitation [phone]   Janice Coffin 03/31/2020, 9:21 AM

## 2020-03-31 NOTE — Progress Notes (Signed)
PROGRESS NOTE    John Walton  WJX:[id number] DOB: 04-03-1932 DOA: 03/26/2020 PCP: [name], MD   Chief Complaint  Patient presents with  . Dysphagia    Brief Narrative:  Patient is a 86 year old male with history of hypertension, hyperlipidemia, dysphagia who presented from [place] facility for the evaluation of dysphagia, fever.  He recently had hip replacement and is currently residing in a skilled nursing facility.  He is mostly bedbound since last few weeks.  Most of the information was taken from his wife.  As per the wife, he had swallowing issues about 10 years ago which automatically resolved after he was giving some antiallergic medication/Benadryl.  He had this issue again few weeks ago and he was worked up extensively at [facility].  He underwent EGD, swallowing evaluation; the studies were largely unremarkable except for mild esophagitis.  He was then sent back to skilled nursing facility. GI/speech following here.  Patient and family are interested on PEG placement if he is found to have persistent dysphagia.    Assessment & Plan:   Principal Problem:   Dysphagia Active Problems:   Essential hypertension   Hyperlipidemia   CKD (chronic kidney disease), stage IV (HCC)   Normocytic anemia   Protein-calorie malnutrition, severe   Hypokalemia   Hypernatremia   DM (diabetes mellitus), type 2 (HCC)   Fever   Debility  1 dysphagia Patient noted to have some intermittent dysphagia over the past 10 years.  Questionable etiology.  Likely multifactorial and could be from osteophyte at C5-C6, poor esophageal motility, esophagitis.   Patient noted to have recently been admitted at [facility] and had extensive work-up there.  Speech evaluation, EGD.  Work-up largely negative except for some mild esophagitis, gastritis, duodenitis.  Speech therapy following and planning a modified barium swallow.  It was noted that  patient and family were interested in PEG placement if patient was to have persistent dysphagia or if recommended by GI.  Patient placed on clear liquids however per RN not tolerating clear liquids and only ice chips and hesitant for oral medications to be given.  Patient seen by GI who recommended PEG tube placement as they feel patient would not do well with a trial of p.o.'s and feel this will delay intervention which he most needs at this time.  Patient status post PEG tube placement (03/29/2020 ).  Tube feeds started today which patient seems to be tolerating.  SLP following and likely MBS to be done in the next 1 to 2 days.  GI following and appreciate input and recommendations.   2.  Chronic indwelling Foley catheter Patient was discharged to rehab with Foley catheter from [facility].  Patient recommended to follow-up with urology at [facility] on discharge.  Flomax started.  Continue Foley catheter with outpatient follow-up with urology.  3.  DISH CT head and neck was ordered to evaluate for other potential causes at [facility].  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  Same finding noted on imaging in [place] in 2011.  Spinal surgery was consulted at [facility] and concluded no safe way for removal of osteophyte, so nonsurgical management was recommended.  Outpatient follow-up.  4.  Report of fever Currently afebrile.  Aspiration pneumonia suspected by chest x-ray negative.  Currently on room air.  Afebrile.  Monitor for now.  5.  Hypernatremia Secondary to  dehydration.  Resolved with hydration.  Saline lock IV fluids.    6.  Severe protein calorie malnutrition Secondary to problem #1.  Status post PEG tube placement.  Tube feeds started and running.  Dietitian consulted and following.  7.  Hyperlipidemia Noted to be on a statin at home prior to admission.  8.   Hypokalemia/hypophosphatemia Potassium at 3.7.  Phosphorus at 2.4.  K-Phos 30 mmol IV x1.  Follow.   9.  Chronic kidney disease stage IV Noted to have been evaluated by nephrology at [facility].  Creatinine at baseline.  Outpatient follow-up with nephrology, Dr.Igwemezie.  10.  Normocytic anemia/iron deficiency anemia Likely secondary to poor oral intake.  Patient with no overt bleeding.  Anemia panel consistent with low iron.  Status post IV iron.  Hemoglobin trickling down currently at 8.0.  Patient with no overt bleeding.  Saline lock IV fluids.  Will likely need oral iron supplementation on discharge.  Transfusion threshold hemoglobin <7.  Will need outpatient follow-up with GI.   11.  Debility/deconditioning Patient recently hospitalized after mechanical fall with left hip fracture requiring surgery 03/08/2020 and discharged to SNF.  PT OT following and recommending SNF.  12.  Diabetes mellitus type 2 Hemoglobin A1c 6.7 (03/07/2020).  CBG of 232 on morning labs.  Saline lock IV fluids.  Sliding scale insulin.  Follow.    DVT prophylaxis: Heparin Code Status: Full Family Communication: Updated patient and wife at bedside. Disposition:   Status is: Inpatient    Dispo: The patient is from: Home              Anticipated d/c is to: SNF              Anticipated d/c date is: 3 to 4 days.              Patient s/pe G-tube placement and initiation of tube feeds and further work-up of dysphagia pending.  Not stable for discharge.       Consultants:   Wound care Cottie Banda 03/27/2020  Gastroenterology: Dr. Cristina Gong 03/28/2020  Interventional radiology Dr. Laurence Ferrari 03/29/2020  Procedures:  Gastrostomy tube placement per IR 03/29/2020  Chest x-ray 03/26/2020  Antimicrobials:   None   Subjective: Patient sitting up in chair.  Wife at bedside.  States he is starting to feel a little bit better.  No chest pain.  No shortness of breath.  No abdominal pain.  Stated  had bowel movement after suppository yesterday and laxatives.   Objective: Vitals:   03/29/20 2200 03/30/20 0530 03/30/20 1355 03/30/20 2053  BP: (!) 155/60 (!) 141/66 (!) 131/57 (!) 136/57  Pulse: 77 79 75 86  Resp: 15 16 17 17   Temp: 98.4 F (36.9 C) 98.4 F (36.9 C) (!) 97.5 F (36.4 C) 98 F (36.7 C)  TempSrc: Oral Oral Oral Oral  SpO2: 100% 95% 96% 97%  Weight:      Height:        Intake/Output Summary (Last 24 hours) at 03/31/2020 1141 Last data filed at 03/31/2020 1026 Gross per 24 hour  Intake 2304.43 ml  Output 1050 ml  Net 1254.43 ml   Filed Weights   03/26/20 1727  Weight: 61.2 kg    Examination:  General exam: Chronically ill-appearing. Respiratory system: Lungs clear to auscultation bilaterally.  No wheezes, no crackles, no rhonchi.  Normal respiratory effort.  Cardiovascular system: RRR no murmurs rubs or gallops.  No JVD.  No lower extremity edema. Gastrointestinal system: Abdomen is soft,  nontender, nondistended, positive bowel sounds.  PEG tube in place.  Central nervous system: Alert and oriented. No focal neurological deficits. Extremities: Symmetric 5 x 5 power. Skin: No rashes, lesions or ulcers Psychiatry: Judgement and insight appear normal. Mood & affect appropriate.     Data Reviewed: I have personally reviewed following labs and imaging studies  CBC: Recent Labs  Lab 03/26/20 1049 03/26/20 1833 03/27/20 0415 03/28/20 0546 03/29/20 0447 03/30/20 0514 03/31/20 0503  WBC 13.3*   < > 8.5 6.0 6.9 8.2 8.6  NEUTROABS 11.8*  --   --  4.5 5.3  --   --   HGB 9.8*   < > 8.3* 8.8* 9.1* 9.1* 8.0*  HCT 31.2*   < > 27.0* 28.6* 29.0* 28.9* 25.3*  MCV 91.2   < > 93.8 93.2 90.9 91.7 91.0  PLT 415*   < > 307 290 312 295 241   < > = values in this interval not displayed.    Basic Metabolic Panel: Recent Labs  Lab 03/27/20 0415 03/28/20 0546 03/29/20 0447 03/30/20 0514 03/30/20 1714 03/31/20 0503  NA 149* 150* 146* 144  --  139  K 3.5  3.2* 3.0* 3.4*  --  3.7  CL 117* 118* 114* 113*  --  111  CO2 20* 21* 22 22  --  22  GLUCOSE 78 99 161* 110*  --  232*  BUN 27* 27* 20 18  --  19  CREATININE 1.66* 1.58* 1.39* 1.43*  --  1.57*  CALCIUM 7.7* 7.7* 7.6* 7.7*  --  7.2*  MG  --   --  1.9 1.9 2.4 2.1  PHOS  --   --   --  2.4* 4.2 2.4*    GFR: Estimated Creatinine Clearance: 29.8 mL/min (A) (by C-G formula based on SCr of 1.57 mg/dL (H)).  Liver Function Tests: Recent Labs  Lab 03/26/20 1049 03/27/20 0415  AST 28 17  ALT 31 21  ALKPHOS 83 68  BILITOT 0.9 0.7  PROT 6.7 5.8*  ALBUMIN 2.8* 2.5*    CBG: Recent Labs  Lab 03/29/20 2204 03/30/20 0731 03/30/20 1134 03/30/20 1653 03/30/20 2057  GLUCAP 86 112* 169* 232* 160*     Recent Results (from the past 240 hour(s))  Resp Panel by RT-PCR (Flu A&B, Covid) Nasopharyngeal Swab     Status: None   Collection Time: 03/26/20 10:49 AM   Specimen: Nasopharyngeal Swab; Nasopharyngeal(NP) swabs in vial transport medium  Result Value Ref Range Status   SARS Coronavirus 2 by RT PCR NEGATIVE NEGATIVE Final    Comment: (NOTE) SARS-CoV-2 target nucleic acids are NOT DETECTED.  The SARS-CoV-2 RNA is generally detectable in upper respiratory specimens during the acute phase of infection. The lowest concentration of SARS-CoV-2 viral copies this assay can detect is 138 copies/mL. A negative result does not preclude SARS-Cov-2 infection and should not be used as the sole basis for treatment or other patient management decisions. A negative result may occur with  improper specimen collection/handling, submission of specimen other than nasopharyngeal swab, presence of viral mutation(s) within the areas targeted by this assay, and inadequate number of viral copies(<138 copies/mL). A negative result must be combined with clinical observations, patient history, and epidemiological information. The expected result is Negative.  Fact Sheet for Patients:   EntrepreneurPulse.com.au  Fact Sheet for Healthcare Providers:  IncredibleEmployment.be  This test is no t yet approved or cleared by the [place] FDA and  has been authorized for detection and/or diagnosis of  SARS-CoV-2 by FDA under an Emergency Use Authorization (EUA). This EUA will remain  in effect (meaning this test can be used) for the duration of the COVID-19 declaration under Section 564(b)(1) of the Act, 21 U.S.C.section 360bbb-3(b)(1), unless the authorization is terminated  or revoked sooner.       Influenza A by PCR NEGATIVE NEGATIVE Final   Influenza B by PCR NEGATIVE NEGATIVE Final    Comment: (NOTE) The Xpert Xpress SARS-CoV-2/FLU/RSV plus assay is intended as an aid in the diagnosis of influenza from Nasopharyngeal swab specimens and should not be used as a sole basis for treatment. Nasal washings and aspirates are unacceptable for Xpert Xpress SARS-CoV-2/FLU/RSV testing.  Fact Sheet for Patients: EntrepreneurPulse.com.au  Fact Sheet for Healthcare Providers: IncredibleEmployment.be  This test is not yet approved or cleared by the [place] FDA and has been authorized for detection and/or diagnosis of SARS-CoV-2 by FDA under an Emergency Use Authorization (EUA). This EUA will remain in effect (meaning this test can be used) for the duration of the COVID-19 declaration under Section 564(b)(1) of the Act, 21 U.S.C. section 360bbb-3(b)(1), unless the authorization is terminated or revoked.  Performed at [facility], [place] [address], [place], [place] [address]   Culture, blood (routine x 2)     Status: Abnormal   Collection Time: 03/26/20  6:33 PM   Specimen: BLOOD  Result Value Ref Range Status   Specimen Description   Final    BLOOD LEFT HAND Performed at [place] [address] [place], [place] [address]    Special Requests    Final    BOTTLES DRAWN AEROBIC ONLY Blood Culture adequate volume Performed at [place] [address] [place], [place] [address]    Culture  Setup Time   Final    GRAM POSITIVE COCCI AEROBIC BOTTLE ONLY CRITICAL RESULT CALLED TO, READ BACK BY AND VERIFIED WITH: PHARMD E JACKSON 03/27/20 AT 2213 SK    Culture (A)  Final    ROTHIA MUCILAGINOSA Standardized susceptibility testing for this organism is not available. Performed at [facility], [place] [address], [place], [place] [address]    Report Status 03/29/2020 FINAL  Final  Culture, blood (routine x 2)     Status: None   Collection Time: 03/26/20  6:33 PM   Specimen: BLOOD  Result Value Ref Range Status   Specimen Description   Final    BLOOD LEFT ANTECUBITAL Performed at [place] [address], [place], [place] [address]    Special Requests   Final    BOTTLES DRAWN AEROBIC ONLY Blood Culture adequate volume Performed at [place] [address] [place], [place] [address]    Culture   Final    NO GROWTH 5 DAYS Performed at [facility], [place] [address], [place], [place] [address]    Report Status 03/31/2020 FINAL  Final  Blood Culture ID Panel (Reflexed)     Status: None   Collection Time: 03/26/20  6:33 PM  Result Value Ref Range Status   Enterococcus faecalis NOT DETECTED NOT DETECTED Final   Enterococcus Faecium NOT DETECTED NOT DETECTED Final   Listeria monocytogenes NOT DETECTED NOT DETECTED Final   Staphylococcus species NOT DETECTED NOT DETECTED Final   Staphylococcus aureus (BCID) NOT DETECTED NOT DETECTED Final   Staphylococcus epidermidis NOT DETECTED NOT DETECTED Final   Staphylococcus lugdunensis NOT DETECTED NOT DETECTED Final   Streptococcus species NOT DETECTED  NOT DETECTED Final   Streptococcus agalactiae NOT DETECTED NOT DETECTED Final   Streptococcus pneumoniae NOT DETECTED NOT DETECTED Final   Streptococcus pyogenes NOT  DETECTED NOT DETECTED Final   A.calcoaceticus-baumannii NOT DETECTED NOT DETECTED Final   Bacteroides fragilis NOT DETECTED NOT DETECTED Final   Enterobacterales NOT DETECTED NOT DETECTED Final   Enterobacter cloacae complex NOT DETECTED NOT DETECTED Final   Escherichia coli NOT DETECTED NOT DETECTED Final   Klebsiella aerogenes NOT DETECTED NOT DETECTED Final   Klebsiella oxytoca NOT DETECTED NOT DETECTED Final   Klebsiella pneumoniae NOT DETECTED NOT DETECTED Final   Proteus species NOT DETECTED NOT DETECTED Final   Salmonella species NOT DETECTED NOT DETECTED Final   Serratia marcescens NOT DETECTED NOT DETECTED Final   Haemophilus influenzae NOT DETECTED NOT DETECTED Final   Neisseria meningitidis NOT DETECTED NOT DETECTED Final   Pseudomonas aeruginosa NOT DETECTED NOT DETECTED Final   Stenotrophomonas maltophilia NOT DETECTED NOT DETECTED Final   Candida albicans NOT DETECTED NOT DETECTED Final   Candida auris NOT DETECTED NOT DETECTED Final   Candida glabrata NOT DETECTED NOT DETECTED Final   Candida krusei NOT DETECTED NOT DETECTED Final   Candida parapsilosis NOT DETECTED NOT DETECTED Final   Candida tropicalis NOT DETECTED NOT DETECTED Final   Cryptococcus neoformans/gattii NOT DETECTED NOT DETECTED Final    Comment: Performed at [facility], [address] [place], [place] [address]         Radiology Studies: IR GASTROSTOMY TUBE MOD SED  Result Date: 03/29/2020 INDICATION: Dysphagia resulting in protein calorie malnutrition EXAM: Fluoroscopically guided placement of percutaneous pull-through gastrostomy tube Interventional Radiologist:  Criselda Peaches, MD MEDICATIONS: 2 g Ancef; 1 mg glucagon antibiotics were administered within 1 hour of the procedure. ANESTHESIA/SEDATION: Versed 1.5 mg IV; Fentanyl 50 mcg IV Moderate Sedation Time:  20 minutes The patient was continuously monitored during the procedure by the interventional radiology nurse under my direct  supervision. CONTRAST:  61mL OMNIPAQUE IOHEXOL 300 MG/ML  SOLN FLUOROSCOPY TIME:  Fluoroscopy Time: 4 minutes 12 seconds (24 mGy). COMPLICATIONS: None immediate. PROCEDURE: Informed written consent was obtained from the patient after a thorough discussion of the procedural risks, benefits and alternatives. All questions were addressed. Maximal Sterile Barrier Technique was utilized including caps, mask, sterile gowns, sterile gloves, sterile drape, hand hygiene and skin antiseptic. A timeout was performed prior to the initiation of the procedure. Maximal barrier sterile technique utilized including caps, mask, sterile gowns, sterile gloves, large sterile drape, hand hygiene, and chlorhexadine skin prep. An angled catheter was advanced over a wire under fluoroscopic guidance through the nose, down the esophagus and into the body of the stomach. The stomach was then insufflated with several 100 ml of air. Fluoroscopy confirmed location of the gastric bubble, as well as inferior displacement of the barium stained colon. Under direct fluoroscopic guidance, a single T-tack was placed, and the anterior gastric wall drawn up against the anterior abdominal wall. Percutaneous access was then obtained into the mid gastric body with an 18 gauge sheath needle. Aspiration of air, and injection of contrast material under fluoroscopy confirmed needle placement. An Amplatz wire was advanced in the gastric body and the access needle exchanged for a 9-French vascular sheath. A snare device was advanced through the vascular sheath and an Amplatz wire advanced through the angled catheter. The Amplatz wire was successfully snared and this was pulled up through the esophagus and out the mouth. A 20-French Alinda Dooms MIC-PEG tube was then  connected to the snare and pulled through the mouth, down the esophagus, into the stomach and out to the anterior abdominal wall. Hand injection of contrast material confirmed intragastric location.  The T-tack retention suture was then cut. The pull through peg tube was then secured with the external bumper and capped. The patient will be observed for several hours with the newly placed tube on low wall suction to evaluate for any post procedure complication. The patient tolerated the procedure well, there is no immediate complication. IMPRESSION: Successful placement of a 20 French pull through gastrostomy tube. Electronically Signed   By: Jacqulynn Cadet M.D.   On: 03/29/2020 13:26        Scheduled Meds: . (feeding supplement) PROSource Plus  30 mL Oral BID WC  . alum & mag hydroxide-simeth  30 mL Oral Once   And  . lidocaine  15 mL Oral Once  . bisacodyl  10 mg Rectal Daily  . Chlorhexidine Gluconate Cloth  6 each Topical Daily  . feeding supplement  1 Container Oral TID BM  . heparin  5,000 Units Subcutaneous Q8H  . insulin aspart  0-5 Units Subcutaneous QHS  . insulin aspart  0-9 Units Subcutaneous TID WC  . latanoprost  1 drop Both Eyes QHS  . multivitamin  15 mL Oral Daily  . pantoprazole (PROTONIX) IV  40 mg Intravenous Q12H  . polyethylene glycol  17 g Per Tube BID  . polyvinyl alcohol  1 drop Both Eyes TID  . sennosides  5 mL Per Tube BID  . sucralfate  1 g Oral TID WC & HS  . tamsulosin  0.4 mg Oral Daily   Continuous Infusions: . sodium chloride 75 mL/hr at 03/31/20 0921  . feeding supplement (OSMOLITE 1.5 CAL) 20 mL/hr at 03/30/20 1800  . potassium PHOSPHATE IVPB (in mmol) 30 mmol (03/31/20 0915)     LOS: 5 days    Time spent: 40 minutes    Irine Seal, MD [facility]   To contact the attending provider between 7A-7P or the covering provider during after hours 7P-7A, please log into the web site [email or web address] and access using universal [place] password for that web site. If you do not have the password, please call the hospital operator.  03/31/2020, 11:41 AM

## 2020-03-31 NOTE — Progress Notes (Signed)
Physical Therapy Treatment Patient Details Name: John Walton MRN: [id number] DOB: 03-28-1933 Today's Date: 03/31/2020    History of Present Illness 86 year old male with history of hypertension, hyperlipidemia, dysphagia who presented from [facility] skilled nursing facility for the evaluation of dysphagia, fever.    PT Comments    Pt AxO x 3 following all directions but feeling "weak and tired".  Assisted with bed mobility first.  General bed mobility comments: side to side rolling for peri care after another BM.  Pt able to assist with upper body using rails.  Caution to adhere to his THP during. Then assisted from sidlying to sitting EOB.  Required an extended seated rest break to adjust.  Feels "weak". General transfer comment: required + 2 assist to rise from elevated bed to [facility] for another BM and then sit to stand from [facility] to recliner. General Gait Details: pt too fatigued after using BSC to amb so pt stood + 2 assist and we switched BSC with reclineer from behind using EVA walker for increased support.  Positioned in recliner with multiple pillows.   Pt was admitted from [facility] where he was Rehabing from his hip and plans to return when medically stable.    Follow Up Recommendations  SNF     Equipment Recommendations  None recommended by PT    Recommendations for Other Services       Precautions / Restrictions Precautions Precautions: Fall;Posterior Hip Precaution Comments: pt does not recall THP Restrictions Weight Bearing Restrictions: No LLE Weight Bearing: Weight bearing as tolerated Other Position/Activity Restrictions: WBAT    Mobility  Bed Mobility Overal bed mobility: Needs Assistance Bed Mobility: Rolling;Supine to Sit Rolling: Max assist   Supine to sit: Max assist     General bed mobility comments: side to side rolling for peri care after another BM.  Pt able to assist with upper body using rails.  Caution to adhere to his THP during. Then assisted  from sidlying to sitting EOB.  Required an extended seated rest break to adjust.  Feels "weak".  Transfers Overall transfer level: Needs assistance Equipment used: Bilateral platform walker Transfers: Sit to/from Stand;Stand Pivot Transfers Sit to Stand: Mod assist;+2 physical assistance;+2 safety/equipment;From elevated surface;Max assist Stand pivot transfers: Max assist;+2 physical assistance;+2 safety/equipment       General transfer comment: required + 2 assist to rise from elevated bed to [facility] for another BM and then sit to stand from [facility] to recliner.  Ambulation/Gait         Gait velocity: decreased   General Gait Details: pt too fatigued after using BSC to amb so pt stood + 2 assist and we switched BSC with reclineer from behind.   Stairs             Wheelchair Mobility    Modified Rankin (Stroke Patients Only)       Balance                                            Cognition Arousal/Alertness: Awake/alert Behavior During Therapy: WFL for tasks assessed/performed Overall Cognitive Status: Within Functional Limits for tasks assessed                                 General Comments: AxO x 3 following all directions but feels "weak and tired"  Exercises      General Comments        Pertinent Vitals/Pain Pain Assessment: No/denies pain Pain Location: "not bad"    Home Living                      Prior Function            PT Goals (current goals can now be found in the care plan section) Progress towards PT goals: Progressing toward goals    Frequency    Min 3X/week      PT Plan Current plan remains appropriate    Co-evaluation              AM-PAC PT "6 Clicks" Mobility   Outcome Measure  Help needed turning from your back to your side while in a flat bed without using bedrails?: A Lot Help needed moving from lying on your back to sitting on the side of a flat bed without using  bedrails?: A Lot Help needed moving to and from a bed to a chair (including a wheelchair)?: A Lot Help needed standing up from a chair using your arms (e.g., wheelchair or bedside chair)?: A Lot Help needed to walk in hospital room?: A Lot Help needed climbing 3-5 steps with a railing? : Total 6 Click Score: 11    End of Session Equipment Utilized During Treatment: Gait belt Activity Tolerance: Patient limited by fatigue Patient left: in chair;with call bell/phone within reach;with chair alarm set Nurse Communication: Mobility status PT Visit Diagnosis: Difficulty in walking, not elsewhere classified (R26.2);History of falling (Z91.81);Unsteadiness on feet (R26.81) Pain - Right/Left: Left Pain - part of body: Hip     Time: 0935-1000 PT Time Calculation (min) (ACUTE ONLY): 25 min  Charges:  $Therapeutic Activity: 23-37 mins                     {Adriona Kaney  PTA Acute  Rehabilitation Services Pager      [phone] Office      [phone]

## 2020-04-01 DIAGNOSIS — R5381 Other malaise: Secondary | ICD-10-CM | POA: Diagnosis not present

## 2020-04-01 DIAGNOSIS — N184 Chronic kidney disease, stage 4 (severe): Secondary | ICD-10-CM | POA: Diagnosis not present

## 2020-04-01 DIAGNOSIS — E119 Type 2 diabetes mellitus without complications: Secondary | ICD-10-CM | POA: Diagnosis not present

## 2020-04-01 DIAGNOSIS — R1310 Dysphagia, unspecified: Secondary | ICD-10-CM | POA: Diagnosis not present

## 2020-04-01 LAB — HEMOGLOBIN AND HEMATOCRIT, BLOOD
HCT: 25.3 % — ABNORMAL LOW (ref 39.0–52.0)
Hemoglobin: 7.8 g/dL — ABNORMAL LOW (ref 13.0–17.0)

## 2020-04-01 LAB — RENAL FUNCTION PANEL
Albumin: 2.2 g/dL — ABNORMAL LOW (ref 3.5–5.0)
Anion gap: 8 (ref 5–15)
BUN: 21 mg/dL (ref 8–23)
CO2: 22 mmol/L (ref 22–32)
Calcium: 7.1 mg/dL — ABNORMAL LOW (ref 8.9–10.3)
Chloride: 109 mmol/L (ref 98–111)
Creatinine, Ser: 1.62 mg/dL — ABNORMAL HIGH (ref 0.61–1.24)
GFR, Estimated: 41 mL/min — ABNORMAL LOW (ref >60)
Glucose, Bld: 213 mg/dL — ABNORMAL HIGH (ref 70–99)
Phosphorus: 2.8 mg/dL (ref 2.5–4.6)
Potassium: 4.3 mmol/L (ref 3.5–5.1)
Sodium: 139 mmol/L (ref 135–145)

## 2020-04-01 LAB — VITAMIN D 25 HYDROXY (VIT D DEFICIENCY, FRACTURES): Vit D, 25-Hydroxy: 20.62 ng/mL — ABNORMAL LOW (ref 30–100)

## 2020-04-01 LAB — GLUCOSE, CAPILLARY
Glucose-Capillary: 223 mg/dL — ABNORMAL HIGH (ref 70–99)
Glucose-Capillary: 235 mg/dL — ABNORMAL HIGH (ref 70–99)

## 2020-04-01 LAB — MAGNESIUM: Magnesium: 2 mg/dL (ref 1.7–2.4)

## 2020-04-01 MED ORDER — CALCIUM CARBONATE-VITAMIN D 500-200 MG-UNIT PO TABS
2.0000 | ORAL_TABLET | Freq: Three times a day (TID) | ORAL | Status: DC
  Administered 2020-04-01 – 2020-04-04 (×7): 2
  Filled 2020-04-01 (×7): qty 2

## 2020-04-01 MED ORDER — INSULIN GLARGINE 100 UNIT/ML ~~LOC~~ SOLN
8.0000 [IU] | Freq: Every day | SUBCUTANEOUS | Status: DC
  Administered 2020-04-01: 8 [IU] via SUBCUTANEOUS
  Filled 2020-04-01 (×2): qty 0.08

## 2020-04-01 NOTE — Progress Notes (Signed)
[facility] Gastroenterology Progress Note  John Walton 87 y.o. 12-25-1932   Subjective: Denies abdominal pain. Lying in bed awake. Wife in room.  Objective: Vital signs: Vitals:   03/31/20 2126 04/01/20 0547  BP: (!) 116/45 (!) 122/48  Pulse: 85 84  Resp: 16 16  Temp: 98 F (36.7 C) 98.2 F (36.8 C)  SpO2: 97% 97%    Physical Exam: Gen: lethargic, elderly, thin, no acute distress, pleasant HEENT: anicteric sclera CV: RRR Chest: CTA B Abd: soft, nontender, nondistended, +BS, PEG tube noted Ext: no edema  Lab Results: Recent Labs    03/31/20 0503 04/01/20 0354  NA 139 139  K 3.7 4.3  CL 111 109  CO2 22 22  GLUCOSE 232* 213*  BUN 19 21  CREATININE 1.57* 1.62*  CALCIUM 7.2* 7.1*  MG 2.1 2.0  PHOS 2.4* 2.8   Recent Labs    04/01/20 0354  ALBUMIN 2.2*   Recent Labs    03/30/20 0514 03/31/20 0503 04/01/20 0354  WBC 8.2 8.6  --   HGB 9.1* 8.0* 7.8*  HCT 28.9* 25.3* 25.3*  MCV 91.7 91.0  --   PLT 295 241  --       Assessment/Plan: Dysphagia likely due to dysmotility. On PEG tube continuous feeds. Would recommend a modified barium swallow early next week (or per speech path recs) and in the meantime avoid PO liquids. Ice chips ok with aspiration precautions. [facility] will f/u early next week.   John Walton 04/01/2020, 12:43 PM  Questions please call [phone] ID: John Walton, male   DOB: [birth year], 87 y.o.   MRN: [id number]

## 2020-04-01 NOTE — Progress Notes (Signed)
PROGRESS NOTE    John Walton  FGH:[id number] DOB: July 07, 1932 DOA: 03/26/2020 PCP: [name], MD   Chief Complaint  Patient presents with  . Dysphagia    Brief Narrative:  Patient is a 87 year old male with history of hypertension, hyperlipidemia, dysphagia who presented from [place] facility for the evaluation of dysphagia, fever.  He recently had hip replacement and is currently residing in a skilled nursing facility.  He is mostly bedbound since last few weeks.  Most of the information was taken from his wife.  As per the wife, he had swallowing issues about 10 years ago which automatically resolved after he was giving some antiallergic medication/Benadryl.  He had this issue again few weeks ago and he was worked up extensively at [facility].  He underwent EGD, swallowing evaluation; the studies were largely unremarkable except for mild esophagitis.  He was then sent back to skilled nursing facility. GI/speech following here.  Patient and family are interested on PEG placement if he is found to have persistent dysphagia.    Assessment & Plan:   Principal Problem:   Dysphagia Active Problems:   Essential hypertension   Hyperlipidemia   CKD (chronic kidney disease), stage IV (HCC)   Normocytic anemia   Protein-calorie malnutrition, severe   Hypokalemia   Hypernatremia   DM (diabetes mellitus), type 2 (HCC)   Fever   Debility  1 dysphagia Patient noted to have some intermittent dysphagia over the past 10 years.  Questionable etiology.  Likely multifactorial and could be from osteophyte at C5-C6, poor esophageal motility, esophagitis.   Patient noted to have recently been admitted at [facility] and had extensive work-up there.  Speech evaluation, EGD.  Work-up largely negative except for some mild esophagitis, gastritis, duodenitis.  Speech therapy following and planning a modified barium swallow.  It was noted that  patient and family were interested in PEG placement if patient was to have persistent dysphagia or if recommended by GI.  Patient placed on clear liquids however per RN not tolerating clear liquids and only ice chips and hesitant for oral medications to be given.  Patient seen by GI who recommended PEG tube placement as they feel patient would not do well with a trial of p.o.'s and feel this will delay intervention which he most needs at this time.  Patient status post PEG tube placement (03/29/2020 ).  Tube feeds started  which patient seems to be tolerating.  SLP following and likely MBS to be done in the next 1 to 2 days.  GI following and appreciate input and recommendations.   2.  Chronic indwelling Foley catheter Patient was discharged to rehab with Foley catheter from [facility].  Patient recommended to follow-up with urology at [facility] on discharge.  Continue Flomax.  Outpatient follow-up with urology.   3.  DISH CT head and neck was ordered to evaluate for other potential causes at [facility].  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  Same finding noted on imaging in [place] in 2011.  Spinal surgery was consulted at [facility] and concluded no safe way for removal of osteophyte, so nonsurgical management was recommended.  Outpatient follow-up.  4.  Report of fever Currently afebrile.  Aspiration pneumonia suspected by chest x-ray negative.  Currently on room air.  Afebrile.  Monitor for now.  5.  Hypernatremia Secondary to dehydration.  Resolved  with hydration.  IV fluids saline lock.  Follow.   6.  Severe protein calorie malnutrition Secondary to problem #1.  Status post PEG tube placement.  Tube feeds started and running.  Dietitian consulted and following.  7.  Hyperlipidemia Noted to be on a statin at home prior to admission.  8.  Hypokalemia/hypophosphatemia Potassium of 4.3.   Phosphorus at 2.8.  Repeat labs in the morning and replete as needed as patient at increased risk for refeeding syndrome.   9.  Chronic kidney disease stage IV Noted to have been evaluated by nephrology at [facility].  Creatinine at baseline.  Outpatient follow-up with nephrology, Dr.Igwemezie.  10.  Normocytic anemia/iron deficiency anemia Likely secondary to poor oral intake.  Patient with no overt bleeding.  Anemia panel consistent with low iron.  Status post IV iron.  Hemoglobin trickling down currently at 7.8.  Patient with no overt bleeding.  IV fluids saline lock.  Will likely need oral iron supplementation on discharge in addition to a stool softener.  Transfusion threshold hemoglobin < 7.  Outpatient follow-up with GI.    11.  Debility/deconditioning Patient recently hospitalized after mechanical fall with left hip fracture requiring surgery 03/08/2020 and discharged to SNF.  PT/ OT following and recommending SNF.  12.  Diabetes mellitus type 2 Hemoglobin A1c 6.7 (03/07/2020).  CBG 223 this morning.  IV fluids have been saline lock.  Start Lantus 8 units daily.  Sliding scale insulin.  Follow.   13.  1/2+ blood cultures likely contaminant Patient with 1/2 blood cultures positive for rothia mucilaginosa.  BC ID negative.  Patient afebrile.  Normal white count.  Likely contamination.  Repeat blood cultures.  Follow.  No need for antibiotics at this time.  14.  Hypocalcemia secondary to vitamin D deficiency Corrected calcium at 8.54.  Vitamin D levels at 20.62.  Place on vitamin D with Os-Cal.  Outpatient follow-up.   DVT prophylaxis: Heparin Code Status: Full Family Communication: Updated patient and wife at bedside. Disposition:   Status is: Inpatient    Dispo: The patient is from: Home              Anticipated d/c is to: SNF              Anticipated d/c date is: 3 to 4 days.              Patient s/pe G-tube placement and initiation of tube feeds and further work-up of  dysphagia pending.  Not stable for discharge.       Consultants:   Wound care Cottie Banda 03/27/2020  Gastroenterology: Dr. Cristina Gong 03/28/2020  Interventional radiology Dr. Laurence Ferrari 03/29/2020  Procedures:  Gastrostomy tube placement per IR 03/29/2020  Chest x-ray 03/26/2020  Antimicrobials:   None   Subjective: Patient laying in bed.  States he feels so-so.  Denies any chest pain.  No shortness of breath.  No abdominal pain.  Intermittently able to tolerate ice chips.  Still with dysphagia.  Wife at bedside.   Objective: Vitals:   03/30/20 2053 03/31/20 1348 03/31/20 2126 04/01/20 0547  BP: (!) 136/57 (!) 124/58 (!) 116/45 (!) 122/48  Pulse: 86 85 85 84  Resp: 17 14 16 16   Temp: 98 F (36.7 C) (!) 97.5 F (36.4 C) 98 F (36.7 C) 98.2 F (36.8 C)  TempSrc: Oral Oral Oral Oral  SpO2: 97% 96% 97% 97%  Weight:      Height:        Intake/Output Summary (  Last 24 hours) at 04/01/2020 1118 Last data filed at 04/01/2020 1000 Gross per 24 hour  Intake 737.97 ml  Output 1100 ml  Net -362.03 ml   Filed Weights   03/26/20 1727  Weight: 61.2 kg    Examination:  General exam: Chronically ill-appearing. Respiratory system: CTAB.  No wheezes, no crackles, no rhonchi.  Normal respiratory effort. Cardiovascular system: Regular rate rhythm no murmurs rubs or gallops.  No JVD.  No lower extremity edema. Gastrointestinal system: Abdomen is soft, nontender, nondistended, positive bowel sounds.  PEG tube in place. Central nervous system: Alert and oriented. No focal neurological deficits. Extremities: Symmetric 5 x 5 power. Skin: No rashes, lesions or ulcers Psychiatry: Judgement and insight appear normal. Mood & affect appropriate.     Data Reviewed: I have personally reviewed following labs and imaging studies  CBC: Recent Labs  Lab 03/26/20 1049 03/26/20 1833 03/27/20 0415 03/28/20 0546 03/29/20 0447 03/30/20 0514 03/31/20 0503 04/01/20 0354  WBC 13.3*    < > 8.5 6.0 6.9 8.2 8.6  --   NEUTROABS 11.8*  --   --  4.5 5.3  --   --   --   HGB 9.8*   < > 8.3* 8.8* 9.1* 9.1* 8.0* 7.8*  HCT 31.2*   < > 27.0* 28.6* 29.0* 28.9* 25.3* 25.3*  MCV 91.2   < > 93.8 93.2 90.9 91.7 91.0  --   PLT 415*   < > 307 290 312 295 241  --    < > = values in this interval not displayed.    Basic Metabolic Panel: Recent Labs  Lab 03/28/20 0546 03/29/20 0447 03/30/20 0514 03/30/20 1714 03/31/20 0503 04/01/20 0354  NA 150* 146* 144  --  139 139  K 3.2* 3.0* 3.4*  --  3.7 4.3  CL 118* 114* 113*  --  111 109  CO2 21* 22 22  --  22 22  GLUCOSE 99 161* 110*  --  232* 213*  BUN 27* 20 18  --  19 21  CREATININE 1.58* 1.39* 1.43*  --  1.57* 1.62*  CALCIUM 7.7* 7.6* 7.7*  --  7.2* 7.1*  MG  --  1.9 1.9 2.4 2.1 2.0  PHOS  --   --  2.4* 4.2 2.4* 2.8    GFR: Estimated Creatinine Clearance: 28.9 mL/min (A) (by C-G formula based on SCr of 1.62 mg/dL (H)).  Liver Function Tests: Recent Labs  Lab 03/26/20 1049 03/27/20 0415 04/01/20 0354  AST 28 17  --   ALT 31 21  --   ALKPHOS 83 68  --   BILITOT 0.9 0.7  --   PROT 6.7 5.8*  --   ALBUMIN 2.8* 2.5* 2.2*    CBG: Recent Labs  Lab 03/30/20 1134 03/30/20 1653 03/30/20 2057 03/31/20 1655 04/01/20 0738  GLUCAP 169* 232* 160* 217* 223*     Recent Results (from the past 240 hour(s))  Resp Panel by RT-PCR (Flu A&B, Covid) Nasopharyngeal Swab     Status: None   Collection Time: 03/26/20 10:49 AM   Specimen: Nasopharyngeal Swab; Nasopharyngeal(NP) swabs in vial transport medium  Result Value Ref Range Status   SARS Coronavirus 2 by RT PCR NEGATIVE NEGATIVE Final    Comment: (NOTE) SARS-CoV-2 target nucleic acids are NOT DETECTED.  The SARS-CoV-2 RNA is generally detectable in upper respiratory specimens during the acute phase of infection. The lowest concentration of SARS-CoV-2 viral copies this assay can detect is 138 copies/mL. A negative result does  not preclude SARS-Cov-2 infection and should  not be used as the sole basis for treatment or other patient management decisions. A negative result may occur with  improper specimen collection/handling, submission of specimen other than nasopharyngeal swab, presence of viral mutation(s) within the areas targeted by this assay, and inadequate number of viral copies(<138 copies/mL). A negative result must be combined with clinical observations, patient history, and epidemiological information. The expected result is Negative.  Fact Sheet for Patients:  EntrepreneurPulse.com.au  Fact Sheet for Healthcare Providers:  IncredibleEmployment.be  This test is no t yet approved or cleared by the [place] FDA and  has been authorized for detection and/or diagnosis of SARS-CoV-2 by FDA under an Emergency Use Authorization (EUA). This EUA will remain  in effect (meaning this test can be used) for the duration of the COVID-19 declaration under Section 564(b)(1) of the Act, 21 U.S.C.section 360bbb-3(b)(1), unless the authorization is terminated  or revoked sooner.       Influenza A by PCR NEGATIVE NEGATIVE Final   Influenza B by PCR NEGATIVE NEGATIVE Final    Comment: (NOTE) The Xpert Xpress SARS-CoV-2/FLU/RSV plus assay is intended as an aid in the diagnosis of influenza from Nasopharyngeal swab specimens and should not be used as a sole basis for treatment. Nasal washings and aspirates are unacceptable for Xpert Xpress SARS-CoV-2/FLU/RSV testing.  Fact Sheet for Patients: EntrepreneurPulse.com.au  Fact Sheet for Healthcare Providers: IncredibleEmployment.be  This test is not yet approved or cleared by the [place] FDA and has been authorized for detection and/or diagnosis of SARS-CoV-2 by FDA under an Emergency Use Authorization (EUA). This EUA will remain in effect (meaning this test can be used) for the duration of the COVID-19 declaration under  Section 564(b)(1) of the Act, 21 U.S.C. section 360bbb-3(b)(1), unless the authorization is terminated or revoked.  Performed at [facility], [place] [address] [place], [place] [address]   Culture, blood (routine x 2)     Status: Abnormal   Collection Time: 03/26/20  6:33 PM   Specimen: BLOOD  Result Value Ref Range Status   Specimen Description   Final    BLOOD LEFT HAND Performed at [facility] [address] [place], [place] [address]    Special Requests   Final    BOTTLES DRAWN AEROBIC ONLY Blood Culture adequate volume Performed at [facility] [address], [place], [place] [address]    Culture  Setup Time   Final    GRAM POSITIVE COCCI AEROBIC BOTTLE ONLY CRITICAL RESULT CALLED TO, READ BACK BY AND VERIFIED WITH: PHARMD E JACKSON 03/27/20 AT 2213 SK    Culture (A)  Final    ROTHIA MUCILAGINOSA Standardized susceptibility testing for this organism is not available. Performed at [facility], [place] [address], [place], [place] [address]    Report Status 03/29/2020 FINAL  Final  Culture, blood (routine x 2)     Status: None   Collection Time: 03/26/20  6:33 PM   Specimen: BLOOD  Result Value Ref Range Status   Specimen Description   Final    BLOOD LEFT ANTECUBITAL Performed at [facility] [address] [place], [place] [address]    Special Requests   Final    BOTTLES DRAWN AEROBIC ONLY Blood Culture adequate volume Performed at [facility] [address], [place], [place] [address]    Culture   Final    NO GROWTH 5 DAYS Performed at [facility], [place] [address] [place], [place] [address]    Report Status 03/31/2020 FINAL  Final  Blood Culture ID Panel (Reflexed)     Status: None   Collection Time: 03/26/20  6:33 PM  Result Value Ref Range Status   Enterococcus faecalis NOT DETECTED NOT DETECTED Final   Enterococcus Faecium NOT DETECTED NOT  DETECTED Final   Listeria monocytogenes NOT DETECTED NOT DETECTED Final   Staphylococcus species NOT DETECTED NOT DETECTED Final   Staphylococcus aureus (BCID) NOT DETECTED NOT DETECTED Final   Staphylococcus epidermidis NOT DETECTED NOT DETECTED Final   Staphylococcus lugdunensis NOT DETECTED NOT DETECTED Final   Streptococcus species NOT DETECTED NOT DETECTED Final   Streptococcus agalactiae NOT DETECTED NOT DETECTED Final   Streptococcus pneumoniae NOT DETECTED NOT DETECTED Final   Streptococcus pyogenes NOT DETECTED NOT DETECTED Final   A.calcoaceticus-baumannii NOT DETECTED NOT DETECTED Final   Bacteroides fragilis NOT DETECTED NOT DETECTED Final   Enterobacterales NOT DETECTED NOT DETECTED Final   Enterobacter cloacae complex NOT DETECTED NOT DETECTED Final   Escherichia coli NOT DETECTED NOT DETECTED Final   Klebsiella aerogenes NOT DETECTED NOT DETECTED Final   Klebsiella oxytoca NOT DETECTED NOT DETECTED Final   Klebsiella pneumoniae NOT DETECTED NOT DETECTED Final   Proteus species NOT DETECTED NOT DETECTED Final   Salmonella species NOT DETECTED NOT DETECTED Final   Serratia marcescens NOT DETECTED NOT DETECTED Final   Haemophilus influenzae NOT DETECTED NOT DETECTED Final   Neisseria meningitidis NOT DETECTED NOT DETECTED Final   Pseudomonas aeruginosa NOT DETECTED NOT DETECTED Final   Stenotrophomonas maltophilia NOT DETECTED NOT DETECTED Final   Candida albicans NOT DETECTED NOT DETECTED Final   Candida auris NOT DETECTED NOT DETECTED Final   Candida glabrata NOT DETECTED NOT DETECTED Final   Candida krusei NOT DETECTED NOT DETECTED Final   Candida parapsilosis NOT DETECTED NOT DETECTED Final   Candida tropicalis NOT DETECTED NOT DETECTED Final   Cryptococcus neoformans/gattii NOT DETECTED NOT DETECTED Final    Comment: Performed at [facility], [address] [place], [place] [address]         Radiology Studies: No results found.      Scheduled  Meds: . (feeding supplement) PROSource Plus  30 mL Oral BID WC  . alum & mag hydroxide-simeth  30 mL Oral Once   And  . lidocaine  15 mL Oral Once  . Chlorhexidine Gluconate Cloth  6 each Topical Daily  . feeding supplement  1 Container Oral TID BM  . heparin  5,000 Units Subcutaneous Q8H  . insulin aspart  0-5 Units Subcutaneous QHS  . insulin aspart  0-9 Units Subcutaneous TID WC  . insulin glargine  8 Units Subcutaneous Daily  . latanoprost  1 drop Both Eyes QHS  . multivitamin  15 mL Oral Daily  . pantoprazole (PROTONIX) IV  40 mg Intravenous Q12H  . polyvinyl alcohol  1 drop Both Eyes TID  . sucralfate  1 g Oral TID WC & HS  . tamsulosin  0.4 mg Oral Daily   Continuous Infusions: . feeding supplement (OSMOLITE 1.5 CAL) 1,000 mL (04/01/20 2993)     LOS: 6 days    Time spent: 40 minutes    Irine Seal, MD [facility]   To contact the attending provider between 7A-7P or the covering provider during after hours 7P-7A, please log into the web site [email or web address] and access using universal [place] password for that web site. If you do  not have the password, please call the hospital operator.  04/01/2020, 11:18 AM

## 2020-04-01 NOTE — TOC Progression Note (Signed)
Transition of Care [facility]) - Progression Note    Patient Details  Name: John Walton MRN: [id number] Date of Birth: 1933-03-04  [facility] of [facility] CM/SW Contact  Lennart Pall, LCSW Phone Number: 04/01/2020, 9:34 AM  Clinical Narrative:    Spoke with [facility] yesterday just to confirm if they will be able to manage continuous tube feedings if this is the plan (vs. [facility]).  They ARE able to manage.  Await medical readiness for pt's return.   Expected Discharge Plan: [place] Barriers to Discharge: Continued Medical Work up  Expected Discharge Plan and Services Expected Discharge Plan: [place] Acute Care Choice: Resumption of Svcs/PTA Provider Living arrangements for the past 2 months: Single Family Home                                       Social Determinants of Health (SDOH) Interventions    Readmission Risk Interventions No flowsheet data found.

## 2020-04-02 DIAGNOSIS — N184 Chronic kidney disease, stage 4 (severe): Secondary | ICD-10-CM | POA: Diagnosis not present

## 2020-04-02 DIAGNOSIS — R1319 Other dysphagia: Secondary | ICD-10-CM

## 2020-04-02 DIAGNOSIS — I10 Essential (primary) hypertension: Secondary | ICD-10-CM | POA: Diagnosis not present

## 2020-04-02 DIAGNOSIS — R5381 Other malaise: Secondary | ICD-10-CM | POA: Diagnosis not present

## 2020-04-02 LAB — RENAL FUNCTION PANEL
Albumin: 2.2 g/dL — ABNORMAL LOW (ref 3.5–5.0)
Anion gap: 8 (ref 5–15)
BUN: 30 mg/dL — ABNORMAL HIGH (ref 8–23)
CO2: 24 mmol/L (ref 22–32)
Calcium: 7.3 mg/dL — ABNORMAL LOW (ref 8.9–10.3)
Chloride: 105 mmol/L (ref 98–111)
Creatinine, Ser: 1.59 mg/dL — ABNORMAL HIGH (ref 0.61–1.24)
GFR, Estimated: 42 mL/min — ABNORMAL LOW (ref >60)
Glucose, Bld: 269 mg/dL — ABNORMAL HIGH (ref 70–99)
Phosphorus: 1.9 mg/dL — ABNORMAL LOW (ref 2.5–4.6)
Potassium: 5.1 mmol/L (ref 3.5–5.1)
Sodium: 137 mmol/L (ref 135–145)

## 2020-04-02 LAB — CBC WITH DIFFERENTIAL/PLATELET
Abs Immature Granulocytes: 0.49 10*3/uL — ABNORMAL HIGH (ref 0.00–0.07)
Basophils Absolute: 0 10*3/uL (ref 0.0–0.1)
Basophils Relative: 1 %
Eosinophils Absolute: 0.2 10*3/uL (ref 0.0–0.5)
Eosinophils Relative: 3 %
HCT: 24.8 % — ABNORMAL LOW (ref 39.0–52.0)
Hemoglobin: 7.6 g/dL — ABNORMAL LOW (ref 13.0–17.0)
Immature Granulocytes: 8 %
Lymphocytes Relative: 7 %
Lymphs Abs: 0.5 10*3/uL — ABNORMAL LOW (ref 0.7–4.0)
MCH: 28.4 pg (ref 26.0–34.0)
MCHC: 30.6 g/dL (ref 30.0–36.0)
MCV: 92.5 fL (ref 80.0–100.0)
Monocytes Absolute: 0.8 10*3/uL (ref 0.1–1.0)
Monocytes Relative: 12 %
Neutro Abs: 4.6 10*3/uL (ref 1.7–7.7)
Neutrophils Relative %: 69 %
Platelets: 210 10*3/uL (ref 150–400)
RBC: 2.68 MIL/uL — ABNORMAL LOW (ref 4.22–5.81)
RDW: 14 % (ref 11.5–15.5)
WBC: 6.6 10*3/uL (ref 4.0–10.5)
nRBC: 0 % (ref 0.0–0.2)

## 2020-04-02 LAB — MAGNESIUM: Magnesium: 2 mg/dL (ref 1.7–2.4)

## 2020-04-02 LAB — GLUCOSE, CAPILLARY
Glucose-Capillary: 243 mg/dL — ABNORMAL HIGH (ref 70–99)
Glucose-Capillary: 275 mg/dL — ABNORMAL HIGH (ref 70–99)
Glucose-Capillary: 234 mg/dL — ABNORMAL HIGH (ref 70–99)

## 2020-04-02 MED ORDER — INSULIN GLARGINE 100 UNIT/ML ~~LOC~~ SOLN
10.0000 [IU] | Freq: Every day | SUBCUTANEOUS | Status: DC
  Administered 2020-04-02: 10 [IU] via SUBCUTANEOUS
  Filled 2020-04-02 (×2): qty 0.1

## 2020-04-02 MED ORDER — SODIUM CHLORIDE (PF) 0.9 % IJ SOLN
INTRAMUSCULAR | Status: AC
  Filled 2020-04-02: qty 10

## 2020-04-02 MED ORDER — SILODOSIN 4 MG PO CAPS
4.0000 mg | ORAL_CAPSULE | Freq: Every day | ORAL | Status: DC
  Administered 2020-04-02 – 2020-04-04 (×3): 4 mg
  Filled 2020-04-02 (×3): qty 1

## 2020-04-02 NOTE — Progress Notes (Signed)
Patient ID: JES COSTALES, male   DOB: 1932/12/20, 87 y.o.   MRN: [id number]  PROGRESS NOTE    John Walton  NID:[id number] DOB: 04-07-1932 DOA: 03/26/2020 PCP: [name], MD   Brief Narrative:  87 year old male with history of hypertension, hyperlipidemia, dysphagia who presented from [place] facility for the evaluation of dysphagia, fever. He recently had hip replacement and is currently residing in a skilled nursing facility. He is mostly bedbound since last few weeks. As per the wife, he had swallowing issues about 10 years ago which automatically resolved after he was giving some antiallergic medication/Benadryl. He had this issue again few weeks ago and he was worked up extensively at [facility]. He underwent EGD, swallowing evaluation; the studies were largely unremarkable except for mild esophagitis. He was then sent back to skilled nursing facility.  GI was consulted for dysphagia.  GI recommended PEG tube placement.  He underwent PEG tube placement by IR on 03/29/2020.  Assessment & Plan:   Dysphagia: Most likely due to dysmotility Severe protein calorie malnutrition -GI following and had recommended PEG tube placement.  Status post gastrostomy tube placement by IR on 03/29/2020.  Tube feeding started on 03/30/2020 and patient is tolerating. -GI recommending modified barium swallow early next week or as per SLP recommendations.  Chronic urinary retention with chronic indwelling Foley catheter -Patient was discharged to rehab with Foley catheter from [facility].  Patient recommended to follow-up with urology at [facility] on discharge.  Continue Flomax.  Outpatient follow-up with urology.   DISH -CT head and neck was ordered to evaluate for other potential causes at [facility].  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  Same finding noted on imaging  in [place] in 2011.  Spinal surgery was consulted at [facility] and concluded no safe way for removal of osteophyte, so nonsurgical management was recommended.  Outpatient follow-up.  Hypernatremia -Resolved with IV hydration  Hyperlipidemia -Noted to be on statin prior to admission  CKD stage IV -Currently at baseline.  Outpatient follow-up with nephrology  Diabetes mellitus type 2 -A1c 6.7 on 03/07/2020.  Continue Lantus and increase to 10 units daily.  Continue CBGs with SSI  Normocytic anemia/iron deficiency anemia -Likely due to poor oral intake.  No overt bleeding.  Anemia panel consistent with low iron.  Status post IV iron treatment.  Hemoglobin 7.6.  Transfuse if hemoglobin is less than 7.  Outpatient follow-up with GI.  Will likely need oral iron supplementation on discharge  Debility/generalized conditioning -Recently hospitalized after mechanical fall with left hip fracture requiring surgery on 03/08/2020 and discharged to SNF. -PT/OT recommending SNF -Palliative care consultation for goals of care  Probable blood culture contamination -1 out of 2 set of blood cultures were positive for Roth[place]: Most likely contaminant.  Currently afebrile with no leukocytosis.  No need for antibiotics at this time  Hypocalcemia -Continue supplementation   DVT prophylaxis: Heparin Code Status: Full Family Communication: None at bedside Disposition Plan: Status is: Inpatient  Remains inpatient appropriate because:Inpatient level of care appropriate due to severity of illness   Dispo: The patient is from: Home              Anticipated d/c is to: SNF              Anticipated d/c date is: 1 day  Patient currently is not medically stable to d/c.  Consultants: GI/IR  Procedures: Gastrostomy tube placement by IR on 03/29/2020  Antimicrobials: None   Subjective: Patient seen and examined at bedside.  Poor historian.  Denies worsening abdominal pain,  shortness of breath, fever or vomiting.  Objective: Vitals:   04/01/20 0547 04/01/20 1318 04/01/20 2130 04/02/20 0546  BP: (!) 122/48 (!) 131/50 110/72 (!) 128/51  Pulse: 84 87 93 89  Resp: 16 16 17 18   Temp: 98.2 F (36.8 C) 97.8 F (36.6 C) 97.6 F (36.4 C) 97.8 F (36.6 C)  TempSrc: Oral Oral    SpO2: 97% 98% 97% 97%  Weight:      Height:        Intake/Output Summary (Last 24 hours) at 04/02/2020 0918 Last data filed at 04/02/2020 0547 Gross per 24 hour  Intake 1343.83 ml  Output 1550 ml  Net -206.17 ml   Filed Weights   03/26/20 1727  Weight: 61.2 kg    Examination:  General exam: Appears calm and comfortable.  Looks chronically ill.  Poor historian. Respiratory system: Bilateral decreased breath sounds at bases Cardiovascular system: S1 & S2 heard, Rate controlled Gastrointestinal system: Abdomen is nondistended, soft and nontender. Normal bowel sounds heard.  PEG tube present. Extremities: No cyanosis, clubbing; trace lower extremity edema   Data Reviewed: I have personally reviewed following labs and imaging studies  CBC: Recent Labs  Lab 03/26/20 1049 03/26/20 1833 03/28/20 0546 03/29/20 0447 03/30/20 0514 03/31/20 0503 04/01/20 0354 04/02/20 0511  WBC 13.3*   < > 6.0 6.9 8.2 8.6  --  6.6  NEUTROABS 11.8*  --  4.5 5.3  --   --   --  4.6  HGB 9.8*   < > 8.8* 9.1* 9.1* 8.0* 7.8* 7.6*  HCT 31.2*   < > 28.6* 29.0* 28.9* 25.3* 25.3* 24.8*  MCV 91.2   < > 93.2 90.9 91.7 91.0  --  92.5  PLT 415*   < > 290 312 295 241  --  210   < > = values in this interval not displayed.   Basic Metabolic Panel: Recent Labs  Lab 03/29/20 0447 03/30/20 0514 03/30/20 1714 03/31/20 0503 04/01/20 0354 04/02/20 0511  NA 146* 144  --  139 139 137  K 3.0* 3.4*  --  3.7 4.3 5.1  CL 114* 113*  --  111 109 105  CO2 22 22  --  22 22 24   GLUCOSE 161* 110*  --  232* 213* 269*  BUN 20 18  --  19 21 30*  CREATININE 1.39* 1.43*  --  1.57* 1.62* 1.59*  CALCIUM 7.6* 7.7*  --   7.2* 7.1* 7.3*  MG 1.9 1.9 2.4 2.1 2.0 2.0  PHOS  --  2.4* 4.2 2.4* 2.8 1.9*   GFR: Estimated Creatinine Clearance: 29.4 mL/min (A) (by C-G formula based on SCr of 1.59 mg/dL (H)). Liver Function Tests: Recent Labs  Lab 03/26/20 1049 03/27/20 0415 04/01/20 0354 04/02/20 0511  AST 28 17  --   --   ALT 31 21  --   --   ALKPHOS 83 68  --   --   BILITOT 0.9 0.7  --   --   PROT 6.7 5.8*  --   --   ALBUMIN 2.8* 2.5* 2.2* 2.2*   No results for input(s): LIPASE, AMYLASE in the last 168 hours. No results for input(s): AMMONIA in the last 168 hours. Coagulation Profile: No results for input(s):  INR, PROTIME in the last 168 hours. Cardiac Enzymes: No results for input(s): CKTOTAL, CKMB, CKMBINDEX, TROPONINI in the last 168 hours. BNP (last 3 results) No results for input(s): PROBNP in the last 8760 hours. HbA1C: No results for input(s): HGBA1C in the last 72 hours. CBG: Recent Labs  Lab 03/30/20 2057 03/31/20 1655 04/01/20 0738 04/01/20 2132 04/02/20 0757  GLUCAP 160* 217* 223* 235* 243*   Lipid Profile: No results for input(s): CHOL, HDL, LDLCALC, TRIG, CHOLHDL, LDLDIRECT in the last 72 hours. Thyroid Function Tests: No results for input(s): TSH, T4TOTAL, FREET4, T3FREE, THYROIDAB in the last 72 hours. Anemia Panel: No results for input(s): VITAMINB12, FOLATE, FERRITIN, TIBC, IRON, RETICCTPCT in the last 72 hours. Sepsis Labs: No results for input(s): PROCALCITON, LATICACIDVEN in the last 168 hours.  Recent Results (from the past 240 hour(s))  Resp Panel by RT-PCR (Flu A&B, Covid) Nasopharyngeal Swab     Status: None   Collection Time: 03/26/20 10:49 AM   Specimen: Nasopharyngeal Swab; Nasopharyngeal(NP) swabs in vial transport medium  Result Value Ref Range Status   SARS Coronavirus 2 by RT PCR NEGATIVE NEGATIVE Final    Comment: (NOTE) SARS-CoV-2 target nucleic acids are NOT DETECTED.  The SARS-CoV-2 RNA is generally detectable in upper respiratory specimens during  the acute phase of infection. The lowest concentration of SARS-CoV-2 viral copies this assay can detect is 138 copies/mL. A negative result does not preclude SARS-Cov-2 infection and should not be used as the sole basis for treatment or other patient management decisions. A negative result may occur with  improper specimen collection/handling, submission of specimen other than nasopharyngeal swab, presence of viral mutation(s) within the areas targeted by this assay, and inadequate number of viral copies(<138 copies/mL). A negative result must be combined with clinical observations, patient history, and epidemiological information. The expected result is Negative.  Fact Sheet for Patients:  EntrepreneurPulse.com.au  Fact Sheet for Healthcare Providers:  IncredibleEmployment.be  This test is no t yet approved or cleared by the [place] FDA and  has been authorized for detection and/or diagnosis of SARS-CoV-2 by FDA under an Emergency Use Authorization (EUA). This EUA will remain  in effect (meaning this test can be used) for the duration of the COVID-19 declaration under Section 564(b)(1) of the Act, 21 U.S.C.section 360bbb-3(b)(1), unless the authorization is terminated  or revoked sooner.       Influenza A by PCR NEGATIVE NEGATIVE Final   Influenza B by PCR NEGATIVE NEGATIVE Final    Comment: (NOTE) The Xpert Xpress SARS-CoV-2/FLU/RSV plus assay is intended as an aid in the diagnosis of influenza from Nasopharyngeal swab specimens and should not be used as a sole basis for treatment. Nasal washings and aspirates are unacceptable for Xpert Xpress SARS-CoV-2/FLU/RSV testing.  Fact Sheet for Patients: EntrepreneurPulse.com.au  Fact Sheet for Healthcare Providers: IncredibleEmployment.be  This test is not yet approved or cleared by the [place] FDA and has been authorized for detection and/or  diagnosis of SARS-CoV-2 by FDA under an Emergency Use Authorization (EUA). This EUA will remain in effect (meaning this test can be used) for the duration of the COVID-19 declaration under Section 564(b)(1) of the Act, 21 U.S.C. section 360bbb-3(b)(1), unless the authorization is terminated or revoked.  Performed at [facility], [place] [address] [place], [place] [address]   Culture, blood (routine x 2)     Status: Abnormal   Collection Time: 03/26/20  6:33 PM   Specimen: BLOOD  Result Value Ref Range Status  Specimen Description   Final    BLOOD LEFT HAND Performed at [facility] [address], [place], [place] [address]    Special Requests   Final    BOTTLES DRAWN AEROBIC ONLY Blood Culture adequate volume Performed at [facility] [address], [place], [place] [address]    Culture  Setup Time   Final    GRAM POSITIVE COCCI AEROBIC BOTTLE ONLY CRITICAL RESULT CALLED TO, READ BACK BY AND VERIFIED WITH: PHARMD E JACKSON 03/27/20 AT 2213 SK    Culture (A)  Final    ROTHIA MUCILAGINOSA Standardized susceptibility testing for this organism is not available. Performed at [facility], [place] [address], [place], [place] [address]    Report Status 03/29/2020 FINAL  Final  Culture, blood (routine x 2)     Status: None   Collection Time: 03/26/20  6:33 PM   Specimen: BLOOD  Result Value Ref Range Status   Specimen Description   Final    BLOOD LEFT ANTECUBITAL Performed at [facility] [address], [place], [place] [address]    Special Requests   Final    BOTTLES DRAWN AEROBIC ONLY Blood Culture adequate volume Performed at [facility] [address], [place], [place] [address]    Culture   Final    NO GROWTH 5 DAYS Performed at [facility], [place] [address] [place], [place] [address]    Report Status 03/31/2020 FINAL  Final  Blood Culture ID Panel (Reflexed)      Status: None   Collection Time: 03/26/20  6:33 PM  Result Value Ref Range Status   Enterococcus faecalis NOT DETECTED NOT DETECTED Final   Enterococcus Faecium NOT DETECTED NOT DETECTED Final   Listeria monocytogenes NOT DETECTED NOT DETECTED Final   Staphylococcus species NOT DETECTED NOT DETECTED Final   Staphylococcus aureus (BCID) NOT DETECTED NOT DETECTED Final   Staphylococcus epidermidis NOT DETECTED NOT DETECTED Final   Staphylococcus lugdunensis NOT DETECTED NOT DETECTED Final   Streptococcus species NOT DETECTED NOT DETECTED Final   Streptococcus agalactiae NOT DETECTED NOT DETECTED Final   Streptococcus pneumoniae NOT DETECTED NOT DETECTED Final   Streptococcus pyogenes NOT DETECTED NOT DETECTED Final   A.calcoaceticus-baumannii NOT DETECTED NOT DETECTED Final   Bacteroides fragilis NOT DETECTED NOT DETECTED Final   Enterobacterales NOT DETECTED NOT DETECTED Final   Enterobacter cloacae complex NOT DETECTED NOT DETECTED Final   Escherichia coli NOT DETECTED NOT DETECTED Final   Klebsiella aerogenes NOT DETECTED NOT DETECTED Final   Klebsiella oxytoca NOT DETECTED NOT DETECTED Final   Klebsiella pneumoniae NOT DETECTED NOT DETECTED Final   Proteus species NOT DETECTED NOT DETECTED Final   Salmonella species NOT DETECTED NOT DETECTED Final   Serratia marcescens NOT DETECTED NOT DETECTED Final   Haemophilus influenzae NOT DETECTED NOT DETECTED Final   Neisseria meningitidis NOT DETECTED NOT DETECTED Final   Pseudomonas aeruginosa NOT DETECTED NOT DETECTED Final   Stenotrophomonas maltophilia NOT DETECTED NOT DETECTED Final   Candida albicans NOT DETECTED NOT DETECTED Final   Candida auris NOT DETECTED NOT DETECTED Final   Candida glabrata NOT DETECTED NOT DETECTED Final   Candida krusei NOT DETECTED NOT DETECTED Final   Candida parapsilosis NOT DETECTED NOT DETECTED Final   Candida tropicalis NOT DETECTED NOT DETECTED Final   Cryptococcus neoformans/gattii NOT DETECTED  NOT DETECTED Final    Comment: Performed at [facility], [address] [place], [place]  [place]         Radiology Studies: No results found.      Scheduled Meds: . sodium chloride (PF)      . (feeding supplement) PROSource Plus  30 mL Oral BID WC  . alum & mag hydroxide-simeth  30 mL Oral Once   And  . lidocaine  15 mL Oral Once  . calcium-vitamin D  2 tablet Per Tube TID with meals  . Chlorhexidine Gluconate Cloth  6 each Topical Daily  . feeding supplement  1 Container Oral TID BM  . heparin  5,000 Units Subcutaneous Q8H  . insulin aspart  0-5 Units Subcutaneous QHS  . insulin aspart  0-9 Units Subcutaneous TID WC  . insulin glargine  8 Units Subcutaneous Daily  . latanoprost  1 drop Both Eyes QHS  . multivitamin  15 mL Oral Daily  . pantoprazole (PROTONIX) IV  40 mg Intravenous Q12H  . polyvinyl alcohol  1 drop Both Eyes TID  . sucralfate  1 g Oral TID WC & HS  . tamsulosin  0.4 mg Oral Daily   Continuous Infusions: . feeding supplement (OSMOLITE 1.5 CAL) 1,000 mL (04/02/20 6759)          Aline August, MD [facility] 04/02/2020, 9:18 AM

## 2020-04-03 ENCOUNTER — Inpatient Hospital Stay (HOSPITAL_COMMUNITY): Payer: No Typology Code available for payment source

## 2020-04-03 DIAGNOSIS — N184 Chronic kidney disease, stage 4 (severe): Secondary | ICD-10-CM | POA: Diagnosis not present

## 2020-04-03 DIAGNOSIS — R5381 Other malaise: Secondary | ICD-10-CM | POA: Diagnosis not present

## 2020-04-03 DIAGNOSIS — I10 Essential (primary) hypertension: Secondary | ICD-10-CM | POA: Diagnosis not present

## 2020-04-03 DIAGNOSIS — R1319 Other dysphagia: Secondary | ICD-10-CM | POA: Diagnosis not present

## 2020-04-03 LAB — CBC WITH DIFFERENTIAL/PLATELET
Abs Immature Granulocytes: 0.7 10*3/uL — ABNORMAL HIGH (ref 0.00–0.07)
Basophils Absolute: 0 10*3/uL (ref 0.0–0.1)
Basophils Relative: 1 %
Eosinophils Absolute: 0.3 10*3/uL (ref 0.0–0.5)
Eosinophils Relative: 4 %
HCT: 23.9 % — ABNORMAL LOW (ref 39.0–52.0)
Hemoglobin: 7.3 g/dL — ABNORMAL LOW (ref 13.0–17.0)
Immature Granulocytes: 10 %
Lymphocytes Relative: 8 %
Lymphs Abs: 0.5 10*3/uL — ABNORMAL LOW (ref 0.7–4.0)
MCH: 28.4 pg (ref 26.0–34.0)
MCHC: 30.5 g/dL (ref 30.0–36.0)
MCV: 93 fL (ref 80.0–100.0)
Monocytes Absolute: 0.9 10*3/uL (ref 0.1–1.0)
Monocytes Relative: 14 %
Neutro Abs: 4.3 10*3/uL (ref 1.7–7.7)
Neutrophils Relative %: 63 %
Platelets: 185 10*3/uL (ref 150–400)
RBC: 2.57 MIL/uL — ABNORMAL LOW (ref 4.22–5.81)
RDW: 13.9 % (ref 11.5–15.5)
WBC: 6.8 10*3/uL (ref 4.0–10.5)
nRBC: 0 % (ref 0.0–0.2)

## 2020-04-03 LAB — MAGNESIUM: Magnesium: 1.9 mg/dL (ref 1.7–2.4)

## 2020-04-03 LAB — GLUCOSE, CAPILLARY: Glucose-Capillary: 230 mg/dL — ABNORMAL HIGH (ref 70–99)

## 2020-04-03 LAB — BASIC METABOLIC PANEL
Anion gap: 9 (ref 5–15)
BUN: 36 mg/dL — ABNORMAL HIGH (ref 8–23)
CO2: 25 mmol/L (ref 22–32)
Calcium: 7.9 mg/dL — ABNORMAL LOW (ref 8.9–10.3)
Chloride: 102 mmol/L (ref 98–111)
Creatinine, Ser: 1.6 mg/dL — ABNORMAL HIGH (ref 0.61–1.24)
GFR, Estimated: 42 mL/min — ABNORMAL LOW (ref >60)
Glucose, Bld: 312 mg/dL — ABNORMAL HIGH (ref 70–99)
Potassium: 4.9 mmol/L (ref 3.5–5.1)
Sodium: 136 mmol/L (ref 135–145)

## 2020-04-03 LAB — SARS CORONAVIRUS 2 (TAT 6-24 HRS): SARS Coronavirus 2: NEGATIVE

## 2020-04-03 MED ORDER — INSULIN GLARGINE 100 UNIT/ML SOLOSTAR PEN: 15.0000 [IU] | PEN_INJECTOR | Freq: Every day | SUBCUTANEOUS | Status: DC

## 2020-04-03 MED ORDER — PROSOURCE PLUS PO LIQD: 30.0000 mL | Freq: Two times a day (BID) | ORAL | 0 refills | Status: DC

## 2020-04-03 MED ORDER — OSMOLITE 1.5 CAL PO LIQD
237.0000 mL | Freq: Every day | ORAL | Status: DC
  Administered 2020-04-03 – 2020-04-04 (×4): 237 mL
  Filled 2020-04-03 (×6): qty 237

## 2020-04-03 MED ORDER — OSMOLITE 1.5 CAL PO LIQD: 237.0000 mL | Freq: Every day | ORAL | 15 refills | Status: DC

## 2020-04-03 MED ORDER — ATORVASTATIN CALCIUM 40 MG PO TABS: 40.0000 mg | ORAL_TABLET | Freq: Every day | ORAL | 0 refills | Status: DC

## 2020-04-03 MED ORDER — SUCRALFATE 1 GM/10ML PO SUSP: 1.0000 g | Freq: Three times a day (TID) | ORAL | 0 refills | Status: DC

## 2020-04-03 MED ORDER — INSULIN GLARGINE 100 UNIT/ML ~~LOC~~ SOLN
15.0000 [IU] | Freq: Every day | SUBCUTANEOUS | Status: DC
  Administered 2020-04-03 – 2020-04-04 (×2): 15 [IU] via SUBCUTANEOUS
  Filled 2020-04-03 (×2): qty 0.15

## 2020-04-03 MED ORDER — FERROUS SULFATE 300 (60 FE) MG/5ML PO SYRP: 300.0000 mg | ORAL_SOLUTION | Freq: Every day | ORAL | 0 refills | Status: DC

## 2020-04-03 MED ORDER — ONDANSETRON HCL 4 MG/2ML IJ SOLN
4.0000 mg | Freq: Four times a day (QID) | INTRAMUSCULAR | Status: DC | PRN
  Administered 2020-04-03: 4 mg via INTRAVENOUS
  Filled 2020-04-03: qty 2

## 2020-04-03 MED ORDER — CALCIUM CARBONATE-VITAMIN D 500-200 MG-UNIT PO TABS: 1.0000 | ORAL_TABLET | Freq: Three times a day (TID) | ORAL | 0 refills | Status: DC

## 2020-04-03 MED ORDER — SILODOSIN 4 MG PO CAPS: 4.0000 mg | ORAL_CAPSULE | Freq: Every day | ORAL | 0 refills | Status: DC

## 2020-04-03 MED ORDER — OSMOLITE 1.5 CAL PO LIQD: 1000.0000 mL | ORAL | 10 refills | Status: DC

## 2020-04-03 MED ORDER — FREE WATER
120.0000 mL | Freq: Every day | Status: DC
  Administered 2020-04-03 – 2020-04-04 (×4): 120 mL

## 2020-04-03 MED ORDER — FREE WATER: 120.0000 mL | Freq: Every day | 15 refills | Status: DC

## 2020-04-03 MED ORDER — PROSOURCE PLUS PO LIQD: 45.0000 mL | Freq: Every day | ORAL | 0 refills | Status: AC

## 2020-04-03 NOTE — Progress Notes (Signed)
Physical Therapy Treatment Patient Details Name: John Walton MRN: [id number] DOB: [birth year] Today's Date: 04/03/2020    History of Present Illness 87 year old male with history of hypertension, hyperlipidemia, dysphagia who presented from [facility] skilled nursing facility for the evaluation of dysphagia, fever.    PT Comments    Pt agreeable to BLE exercises in supine, cues for motor control to decrease momentum and compensations. Pt assisted to seated EOB with constant cues for hand placement and posterior hip precautions with bed mobility. Pt able to stand EOB with 2+ assist, therapist positioned anterior to pt blocking BLE from sliding, NT noted bowel movement upon pt rising so assisted pt in pericare. Pt fatigues easily requiring return to sitting, able to complete 8 STS reps from EOB each time standing for 20-30 sec, once able to stand with mod-max A for 1 minute while NT provided pericare. Pt with bowel incontinence and requests to return to supine instead of transferring to chair due to multiple BMs during session. Assisted pt back to supine, cued pt with rolling R/L with max posterior hip precaution education. Pt denies pain throughout session, verbalizes high fear of falling requiring soothing and encouragement from therapist and nursing staff. Pt supine in bed with RN and MD in room at [facility].   Follow Up Recommendations  SNF     Equipment Recommendations  None recommended by PT    Recommendations for Other Services       Precautions / Restrictions Precautions Precautions: Fall;Posterior Hip Precaution Comments: pt does not recall THP Restrictions Weight Bearing Restrictions: No LLE Weight Bearing: Weight bearing as tolerated    Mobility  Bed Mobility Overal bed mobility: Needs Assistance Bed Mobility: Rolling;Supine to Sit;Sit to Supine Rolling: Mod assist   Supine to sit: Max assist Sit to supine: Max assist   General bed mobility comments: rolling supine to R/L  with cues for hand placement to assist to reposition bed pad and NT providing pericare, max A to come to sitting EOB with pt sliding BLE 1-2 inches towards EOB, max A to return to supine and scoot up in bed with 2+ assist using bed pad, max cues to maintain posterior hip precautions with all mobility  Transfers Overall transfer level: Needs assistance Equipment used: 2 person hand held assist Transfers: Sit to/from Stand Sit to Stand: Mod assist;+2 physical assistance;+2 safety/equipment;From elevated surface         General transfer comment: mod A +2 for all STS reps, therapist blocking BLE from sliding and preventing LLE adduction, cues for upright trunk to respect posterior hip precautions; completes 8 STS reps with therapist anterior to pt and NT on pt's L side, tolerates standing for 1 minute max while NT assists with pericare, due to bowel incontinence pt declines transfer to chair so assisted back to supine  Ambulation/Gait                 Stairs             Wheelchair Mobility    Modified Rankin (Stroke Patients Only)       Balance Overall balance assessment: Needs assistance Sitting-balance support: Feet supported;No upper extremity supported Sitting balance-Leahy Scale: Fair Sitting balance - Comments: seated EOB   Standing balance support: During functional activity;Bilateral upper extremity supported Standing balance-Leahy Scale: Zero Standing balance comment: mod/max A for static standing, constant cues for upright posture due to stooped/squatted posture  Cognition Arousal/Alertness: Awake/alert Behavior During Therapy: WFL for tasks assessed/performed Overall Cognitive Status: Within Functional Limits for tasks assessed                                        Exercises General Exercises - Lower Extremity Ankle Circles/Pumps: Supine;AROM;Strengthening;Both;20 reps Short Arc Quad:  Supine;AROM;Strengthening;Both;10 reps Heel Slides: Supine;AROM;Strengthening;Both;10 reps    General Comments        Pertinent Vitals/Pain Pain Assessment: No/denies pain    Home Living                      Prior Function            PT Goals (current goals can now be found in the care plan section) Acute Rehab PT Goals PT Goal Formulation: With patient Time For Goal Achievement: 04/10/20 Potential to Achieve Goals: Fair Progress towards PT goals: Progressing toward goals    Frequency    Min 3X/week      PT Plan Current plan remains appropriate    Co-evaluation              AM-PAC PT "6 Clicks" Mobility   Outcome Measure  Help needed turning from your back to your side while in a flat bed without using bedrails?: A Lot Help needed moving from lying on your back to sitting on the side of a flat bed without using bedrails?: A Lot Help needed moving to and from a bed to a chair (including a wheelchair)?: A Lot Help needed standing up from a chair using your arms (e.g., wheelchair or bedside chair)?: A Lot Help needed to walk in hospital room?: A Lot Help needed climbing 3-5 steps with a railing? : Total 6 Click Score: 11    End of Session Equipment Utilized During Treatment: Gait belt Activity Tolerance: Patient limited by fatigue Patient left: in bed;with call bell/phone within reach;with nursing/sitter in room (RN and MD in room) Nurse Communication: Mobility status;Other (comment) (bowel incontinence) PT Visit Diagnosis: Difficulty in walking, not elsewhere classified (R26.2);History of falling (Z91.81);Unsteadiness on feet (R26.81) Pain - Right/Left: Left Pain - part of body: Hip     Time: 8315-1761 PT Time Calculation (min) (ACUTE ONLY): 29 min  Charges:  $Therapeutic Exercise: 8-22 mins $Therapeutic Activity: 8-22 mins                      Tori Devri Kreher PT, DPT 04/03/20, 11:13 AM

## 2020-04-03 NOTE — Discharge Summary (Addendum)
Physician Discharge Summary  John Walton RJJ:[id number] DOB: [birth year] DOA: 03/26/2020  PCP: [name], MD  Admit date: 03/26/2020 Discharge date: 04/03/2020  Admitted From: Home Disposition: SNF  Recommendations for Outpatient Follow-up:  1. Follow up with SNF provider at earliest convenience 2. Outpatient follow-up with GI 3. Recommend outpatient evaluation and follow-up with palliative care 4. Tube feeding as per dietary recommendations. 5. Follow up in ED if symptoms worsen or new appear   Home Health: No Equipment/Devices: PEG tube placed by IR on 03/29/2020  Discharge Condition: Guarded to poor CODE STATUS: Full Diet recommendation: Heart healthy/carb modified diet: Tube feeding diet as per dietary SLP recommends: Nectar liquids and water from floor stock only-no trays.  All medications and feeding through tube   Brief/Interim Summary: 87 year old male with history of hypertension, hyperlipidemia, dysphagia who presented from [place] facility for the evaluation of dysphagia, fever. He recently had hip replacement and is currently residing in a skilled nursing facility. He is mostly bedbound since last few weeks. As per the wife, he had swallowing issues about 10 years ago which automatically resolved after he was giving some antiallergic medication/Benadryl. He had this issue again few weeks ago and he was worked up extensively at [facility]. He underwent EGD, swallowing evaluation; the studies were largely unremarkable except for mild esophagitis. He was then sent back to skilled nursing facility.  GI was consulted for dysphagia.  GI recommended PEG tube placement.  He underwent PEG tube placement by IR on 03/29/2020.  He will be discharged to SNF once bed is available.  Discharge Diagnoses:   Dysphagia: Most likely due to dysmotility Severe protein calorie malnutrition -GI following and had recommended PEG tube  placement.  Status post gastrostomy tube placement by IR on 03/29/2020.  Tube feeding started on 03/30/2020 and patient is tolerating. -SLP evaluated the patient today and recommends Nectar liquids and water from floor stock only-no trays.  All medications and feeding through tube - Continue tube feeding as per dietary recommendations. -Discharge to SNF once bed is available  Chronic urinary retention with chronic indwelling Foley catheter -Patient was discharged to rehab with Foley catheter from [facility]. Patient recommended to follow-up with urology at [facility] on discharge. Continue silodosin via tube. Outpatient follow-up with urology.   DISH -CT head and neck was ordered to evaluate for other potential causes at [facility]. 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 Same finding noted on imaging in [place] in 2011. Spinal surgery was consulted at [facility] and concluded no safe way for removal of osteophyte, so nonsurgical management was recommended. Outpatient follow-up.  Hypernatremia -Resolved with IV hydration  Hyperlipidemia -Noted to be on statin prior to admission  CKD stage IV -Currently at baseline.  Outpatient follow-up with nephrology  Diabetes mellitus type 2 -A1c 6.7 on 03/07/2020.  Continue Lantus and increase to 15 units daily.    Outpatient follow-up.  Normocytic anemia/iron deficiency anemia -Likely due to poor oral intake.  No overt bleeding.  Anemia panel consistent with low iron.  Status post IV iron treatment.  Hemoglobin 7.6.  Transfuse if hemoglobin is less than 7.  Outpatient follow-up with GI.  Will likely need oral iron supplementation on discharge -Continue Protonix and sucralfate.  Debility/generalized conditioning -Recently hospitalized after mechanical fall with left hip fracture requiring surgery on 03/08/2020 and discharged to SNF. -PT/OT  recommending SNF -Palliative care  consultation for goals of care is pending.  This can happen as an outpatient.  Probable blood culture contamination -1 out of 2 set of blood cultures were positive for Rothia: Most likely contaminant.  Currently afebrile with no leukocytosis.  No need for antibiotics at this time  Hypocalcemia -Continue supplementation  Discharge Instructions  Discharge Instructions    Amb Referral to Palliative Care   Complete by: As directed    Goals of care   Diet - low sodium heart healthy   Complete by: As directed    Discharge wound care:   Complete by: As directed    Wound care to right lateral heel Unstageable PI, POA:  Cleanse with soap and water, rinse and pat dry. Paint with betadine swabstick and allow to air dry. When dry, top with dry dressing and secure with Kerlix roll gauze/paper tape. Change twice daily.  Place foot into [employer].   Increase activity slowly   Complete by: As directed      Allergies as of 04/03/2020      Reactions   Robitussin Cold Cough+ Chest [dextromethorphan-guaifenesin] Swelling   Laryngeal edema - any cough syrup      Medication List    STOP taking these medications   acetaminophen 325 MG tablet Commonly known as: TYLENOL   alum & mag hydroxide-simeth 998-338-25 MG/5ML suspension Commonly known as: MAALOX/MYLANTA   amLODipine 5 MG tablet Commonly known as: NORVASC   aspirin 325 MG EC tablet   docusate sodium 100 MG capsule Commonly known as: COLACE   guaiFENesin-dextromethorphan 100-10 MG/5ML syrup Commonly known as: ROBITUSSIN DM   HYDROcodone-acetaminophen 10-325 MG tablet Commonly known as: Norco   metoCLOPramide 5 MG tablet Commonly known as: REGLAN   pantoprazole 40 MG tablet Commonly known as: PROTONIX   senna-docusate 8.6-50 MG tablet Commonly known as: Senokot-S   tamsulosin 0.4 MG Caps capsule Commonly known as: FLOMAX     TAKE these medications   atorvastatin 40 MG  tablet Commonly known as: LIPITOR Place 1 tablet (40 mg total) into feeding tube daily. What changed: how to take this   calcium-vitamin D 500-200 MG-UNIT tablet Commonly known as: OSCAL WITH D Place 1 tablet into feeding tube with breakfast, with lunch, and with evening meal.   carboxymethylcellulose 0.5 % Soln Commonly known as: REFRESH PLUS Place 1 drop into both eyes in the morning, at noon, and at bedtime.   feeding supplement (OSMOLITE 1.5 CAL) Liqd Place 237 mLs into feeding tube 5 (five) times daily.   (feeding supplement) PROSource Plus liquid Take 45 mLs by mouth daily. Via tube   ferrous sulfate 300 (60 Fe) MG/5ML syrup Place 5 mLs (300 mg total) into feeding tube daily.   free water Soln Place 120 mLs into feeding tube 5 (five) times daily.   insulin glargine 100 UNIT/ML Solostar Pen Commonly known as: LANTUS Inject 15 Units into the skin daily. What changed: how much to take   Insulin Pen Needle 29G X 12.7MM Misc Use as directed   latanoprost 0.005 % ophthalmic solution Commonly known as: XALATAN Place 1 drop into both eyes at bedtime.   silodosin 4 MG Caps capsule Commonly known as: RAPAFLO Place 1 capsule (4 mg total) into feeding tube daily. Start taking on: April 04, 2020   Simbrinza 1-0.2 % Susp Generic drug: Brinzolamide-Brimonidine Place 1 drop into both eyes in the morning and at bedtime.   sucralfate 1 GM/10ML suspension Commonly known as: CARAFATE Take 10 mLs (1 g total) by  mouth 4 (four) times daily -  with meals and at bedtime.   Systane 0.4-0.3 % Gel ophthalmic gel Generic drug: Polyethyl Glycol-Propyl Glycol Place 1 application into both eyes in the morning and at bedtime.            Discharge Care Instructions  (From admission, onward)         Start     Ordered   04/03/20 0000  Discharge wound care:       Comments: Wound care to right lateral heel Unstageable PI, POA:  Cleanse with soap and water, rinse and pat dry. Paint  with betadine swabstick and allow to air dry. When dry, top with dry dressing and secure with Kerlix roll gauze/paper tape. Change twice daily.  Place foot into [employer].   04/03/20 1208           Follow-up Information    Tisovec, Fransico Him, MD. Schedule an appointment as soon as possible for a visit in 1 week(s).   Specialty: Internal Medicine Contact information: [place] [address] [phone]              Allergies  Allergen Reactions  . Robitussin Cold Cough+ Chest [Dextromethorphan-Guaifenesin] Swelling    Laryngeal edema - any cough syrup    Consultations:  GI/IR.  Palliative care consultation pending.   Procedures/Studies: DG Chest 1 View  Result Date: 03/07/2020 CLINICAL DATA:  Preop exam. EXAM: CHEST  1 VIEW COMPARISON:  Chest x-ray 4 06/20/1998. FINDINGS: Mediastinum and hilar structures normal. Heart size normal. Low lung volumes with mild left base subsegmental axis. No pleural effusion or pneumothorax. Degenerative change thoracic spine and both shoulders. IMPRESSION: Low lung volumes with mild left base subsegmental atelectasis. Electronically Signed   By: Marcello Moores  Register   On: 03/07/2020 13:07   DG Shoulder Right  Result Date: 03/07/2020 CLINICAL DATA:  Right shoulder pain after a fall last night. EXAM: RIGHT SHOULDER - 2+ VIEW COMPARISON:  None. FINDINGS: No acute fracture or dislocation is identified. Mild-to-moderate joint space narrowing and marginal spurring are noted at the acromioclavicular joint. Narrowing of the acromial humeral interval could indicate a rotator cuff tear. IMPRESSION: No acute osseous abnormality identified. Electronically Signed   By: Logan Bores M.D.   On: 03/07/2020 13:05   CT HEAD WO CONTRAST  Result Date: 03/07/2020 CLINICAL DATA:  Head trauma, minor (Age >= 65y) EXAM: CT HEAD WITHOUT CONTRAST TECHNIQUE: Contiguous axial images were obtained from the base of the skull through the vertex without  intravenous contrast. COMPARISON:  04/23/2015 and prior FINDINGS: Brain: No acute infarct or intracranial hemorrhage. No mass lesion. No midline shift, ventriculomegaly or extra-axial fluid collection. Mild cerebral atrophy with ex vacuo dilatation. Chronic microvascular ischemic changes. Remote right frontal insult. Vascular: No hyperdense vessel or unexpected calcification. Bilateral carotid siphon atherosclerotic calcifications. Skull: Negative for fracture or focal lesion. Sinuses/Orbits: No acute orbital finding. Pneumatized paranasal sinuses and mastoid air cells. Other: Right frontal scalp hematoma. IMPRESSION: 1. No acute intracranial process.  Remote right frontal insult. 2. Mild cerebral atrophy and chronic microvascular ischemic changes. 3. Right frontal scalp hematoma. Electronically Signed   By: Primitivo Gauze M.D.   On: 03/07/2020 14:49   US Abdomen Complete  Result Date: 03/07/2020 CLINICAL DATA:  Abdominal distension EXAM: ABDOMEN ULTRASOUND COMPLETE COMPARISON:  None FINDINGS: Gallbladder: Surgically absent Common bile duct: Diameter: 5 mm, normal Liver: Normal echogenicity without mass or nodularity. Portal vein is patent on color Doppler imaging with normal direction of  blood flow towards the liver. IVC: Short segment of intrahepatic IVC normal appearance, remainder obscured by bowel gas Pancreas: Portion of head to and proximal tail normal appearance, remainder obscured by bowel gas Spleen: Normal appearance, 4.8 cm length Right Kidney: Length: 9.8 cm. Normal cortical thickness. Increased cortical echogenicity. No mass or shadowing calcification. Mild hydronephrosis. Left Kidney: Length: 9.9 cm. Normal cortical thickness. Increased cortical echogenicity. No mass or calcification. Mild hydronephrosis. Abdominal aorta: Small portion of mid abdominal aorta normal caliber, remainder obscured by bowel gas Other findings: No free fluid IMPRESSION: Post cholecystectomy. Medical renal disease  changes of both kidneys with mild BILATERAL hydronephrosis of uncertain etiology. Inadequate visualization of pancreas and aorta. Electronically Signed   By: Lavonia Dana M.D.   On: 03/07/2020 18:25   IR GASTROSTOMY TUBE MOD SED  Result Date: 03/29/2020 INDICATION: Dysphagia resulting in protein calorie malnutrition EXAM: Fluoroscopically guided placement of percutaneous pull-through gastrostomy tube Interventional Radiologist:  Criselda Peaches, MD MEDICATIONS: 2 g Ancef; 1 mg glucagon antibiotics were administered within 1 hour of the procedure. ANESTHESIA/SEDATION: Versed 1.5 mg IV; Fentanyl 50 mcg IV Moderate Sedation Time:  20 minutes The patient was continuously monitored during the procedure by the interventional radiology nurse under my direct supervision. CONTRAST:  74mL OMNIPAQUE IOHEXOL 300 MG/ML  SOLN FLUOROSCOPY TIME:  Fluoroscopy Time: 4 minutes 12 seconds (24 mGy). COMPLICATIONS: None immediate. PROCEDURE: Informed written consent was obtained from the patient after a thorough discussion of the procedural risks, benefits and alternatives. All questions were addressed. Maximal Sterile Barrier Technique was utilized including caps, mask, sterile gowns, sterile gloves, sterile drape, hand hygiene and skin antiseptic. A timeout was performed prior to the initiation of the procedure. Maximal barrier sterile technique utilized including caps, mask, sterile gowns, sterile gloves, large sterile drape, hand hygiene, and chlorhexadine skin prep. An angled catheter was advanced over a wire under fluoroscopic guidance through the nose, down the esophagus and into the body of the stomach. The stomach was then insufflated with several 100 ml of air. Fluoroscopy confirmed location of the gastric bubble, as well as inferior displacement of the barium stained colon. Under direct fluoroscopic guidance, a single T-tack was placed, and the anterior gastric wall drawn up against the anterior abdominal wall.  Percutaneous access was then obtained into the mid gastric body with an 18 gauge sheath needle. Aspiration of air, and injection of contrast material under fluoroscopy confirmed needle placement. An Amplatz wire was advanced in the gastric body and the access needle exchanged for a 9-French vascular sheath. A snare device was advanced through the vascular sheath and an Amplatz wire advanced through the angled catheter. The Amplatz wire was successfully snared and this was pulled up through the esophagus and out the mouth. A 20-French Alinda Dooms MIC-PEG tube was then connected to the snare and pulled through the mouth, down the esophagus, into the stomach and out to the anterior abdominal wall. Hand injection of contrast material confirmed intragastric location. The T-tack retention suture was then cut. The pull through peg tube was then secured with the external bumper and capped. The patient will be observed for several hours with the newly placed tube on low wall suction to evaluate for any post procedure complication. The patient tolerated the procedure well, there is no immediate complication. IMPRESSION: Successful placement of a 20 French pull through gastrostomy tube. Electronically Signed   By: Jacqulynn Cadet M.D.   On: 03/29/2020 13:26   CT HIP LEFT WO CONTRAST  Result Date:  03/07/2020 CLINICAL DATA:  Left hip pain after fall.  Abnormal x-ray. EXAM: CT OF THE LEFT HIP WITHOUT CONTRAST TECHNIQUE: Multidetector CT imaging of the left hip was performed according to the standard protocol. Multiplanar CT image reconstructions were also generated. COMPARISON:  X-ray 03/07/2020 FINDINGS: Bones/Joint/Cartilage Acute nondisplaced fracture of the left femoral neck and intertrochanteric region. No significant impaction or angulation. No fracture involvement of the femoral head. Severe osteoarthritis of the left hip joint with complete joint space loss, subchondral sclerosis/cystic change, and marginal  osteophyte formation. Visualized portion of the left hemipelvis is intact without fracture. The inferior aspect of the left SI joint is intact without diastasis. Ligaments Suboptimally assessed by CT. Muscles and Tendons No acute musculotendinous abnormality by CT. Soft tissues There is induration within the subcutaneous soft tissues overlying the greater trochanter without organized soft tissue collection or hematoma. Visualized prostate gland is enlarged. Scattered vascular calcifications. IMPRESSION: 1. Acute nondisplaced fracture of the left femoral neck and intertrochanteric region. 2. 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 3. Enlarged prostate gland. Electronically Signed   By: Davina Poke D.O.   On: 03/07/2020 15:31   DG Chest Portable 1 View  Result Date: 03/26/2020 CLINICAL DATA:  Choose recent choking episode EXAM: PORTABLE CHEST 1 VIEW COMPARISON:  03/19/2020 FINDINGS: Cardiac shadow is within normal limits. Aortic calcifications are noted. Lungs are well aerated bilaterally. No focal infiltrate or sizable effusion is seen. No bony abnormality is noted. IMPRESSION: No active disease. Electronically Signed   By: Inez Catalina M.D.   On: 03/26/2020 09:50   DG C-Arm 1-60 Min-No Report  Result Date: 03/08/2020 Fluoroscopy was utilized by the requesting physician.  No radiographic interpretation.   ECHOCARDIOGRAM COMPLETE  Result Date: 03/08/2020    ECHOCARDIOGRAM REPORT   Patient Name:   DEVONTAYE GROUND Date of Exam: 03/08/2020 Medical Rec #:  [id number]       Height:       66.0 in Accession #:    [id number]      Weight:       138.4 lb Date of Birth:  [birth year]       BSA:          1.710 m Patient Age:    76 years        BP:           120/58 mmHg Patient Gender: M               HR:           92 bpm. Exam Location:  Inpatient Procedure: 2D Echo, Cardiac Doppler and Color Doppler Indications:    Murmur 785.2 / R01.1  History:        Patient has no prior history of Echocardiogram  examinations.                 Risk Factors:Hypertension, Dyslipidemia and Former Smoker.  Sonographer:    Vickie Epley RDCS Referring Phys: [place]  1. Left ventricular ejection fraction, by estimation, is 55 to 60%. The left ventricle has normal function. The left ventricle has no regional wall motion abnormalities. There is moderate left ventricular hypertrophy. Left ventricular diastolic parameters are indeterminate.  2. Right ventricular systolic function is normal. The right ventricular size is normal. Tricuspid regurgitation signal is inadequate for assessing PA pressure.  3. The mitral valve is normal in structure. No evidence of mitral valve regurgitation.  4. Aortic dilatation noted. There is mild dilatation of the aortic root,  measuring 40 mm.  5. The inferior vena cava is normal in size with greater than 50% respiratory variability, suggesting right atrial pressure of 3 mmHg.  6. The aortic valve is calcified. There is moderate calcification of the aortic valve. Aortic valve regurgitation is not visualized. Mild to moderate aortic valve stenosis. Vmax 2.2 m/s, MG 12 mmHg, AVA 1.5 cm^2, DI 0.33 FINDINGS  Left Ventricle: Left ventricular ejection fraction, by estimation, is 55 to 60%. The left ventricle has normal function. The left ventricle has no regional wall motion abnormalities. The left ventricular internal cavity size was normal in size. There is  moderate left ventricular hypertrophy. Left ventricular diastolic parameters are indeterminate. Right Ventricle: The right ventricular size is normal. Right vetricular wall thickness was not assessed. Right ventricular systolic function is normal. Tricuspid regurgitation signal is inadequate for assessing PA pressure. Left Atrium: Left atrial size was normal in size. Right Atrium: Right atrial size was normal in size. Pericardium: Trivial pericardial effusion is present. Mitral Valve: The mitral valve is normal in structure. No  evidence of mitral valve regurgitation. Tricuspid Valve: The tricuspid valve is normal in structure. Tricuspid valve regurgitation is trivial. Aortic Valve: The aortic valve is calcified. There is moderate calcification of the aortic valve. Aortic valve regurgitation is not visualized. Mild to moderate aortic stenosis is present. Aortic valve mean gradient measures 12.0 mmHg. Aortic valve peak gradient measures 19.2 mmHg. Aortic valve area, by VTI measures 1.54 cm. Pulmonic Valve: The pulmonic valve was not well visualized. Pulmonic valve regurgitation is trivial. Aorta: Aortic dilatation noted. There is mild dilatation of the aortic root, measuring 40 mm. Venous: The inferior vena cava is normal in size with greater than 50% respiratory variability, suggesting right atrial pressure of 3 mmHg. IAS/Shunts: The interatrial septum was not well visualized.  LEFT VENTRICLE PLAX 2D LVIDd:         4.90 cm      Diastology LVIDs:         3.40 cm      LV e' medial:    5.48 cm/s LV PW:         0.90 cm      LV E/e' medial:  16.3 LV IVS:        0.90 cm      LV e' lateral:   7.50 cm/s LVOT diam:     2.40 cm      LV E/e' lateral: 11.9 LV SV:         70 LV SV Index:   41 LVOT Area:     4.52 cm  LV Volumes (MOD) LV vol d, MOD A2C: 84.8 ml LV vol d, MOD A4C: 102.0 ml LV vol s, MOD A2C: 37.0 ml LV vol s, MOD A4C: 47.8 ml LV SV MOD A2C:     47.8 ml LV SV MOD A4C:     102.0 ml LV SV MOD BP:      51.4 ml RIGHT VENTRICLE RV S prime:     13.70 cm/s TAPSE (M-mode): 1.6 cm LEFT ATRIUM           Index       RIGHT ATRIUM           Index LA diam:      2.30 cm 1.34 cm/m  RA Area:     11.10 cm LA Vol (A2C): 22.1 ml 12.92 ml/m RA Volume:   25.40 ml  14.85 ml/m LA Vol (A4C): 28.1 ml 16.43 ml/m  AORTIC VALVE AV Area (  Vmax):    1.82 cm AV Area (Vmean):   1.66 cm AV Area (VTI):     1.54 cm AV Vmax:           219.00 cm/s AV Vmean:          166.000 cm/s AV VTI:            0.451 m AV Peak Grad:      19.2 mmHg AV Mean Grad:      12.0 mmHg LVOT  Vmax:         88.10 cm/s LVOT Vmean:        60.900 cm/s LVOT VTI:          0.154 m LVOT/AV VTI ratio: 0.34  AORTA Ao Root diam: 4.00 cm Ao Asc diam:  3.50 cm MITRAL VALVE MV Area (PHT): 3.39 cm     SHUNTS MV Decel Time: 224 msec     Systemic VTI:  0.15 m MV E velocity: 89.10 cm/s   Systemic Diam: 2.40 cm MV A velocity: 104.00 cm/s MV E/A ratio:  0.86 Oswaldo Milian MD Electronically signed by Oswaldo Milian MD Signature Date/Time: 03/08/2020/10:08:13 AM    Final    CT RENAL STONE STUDY  Result Date: 03/09/2020 CLINICAL DATA:  Hydronephrosis, chronic kidney disease EXAM: CT ABDOMEN AND PELVIS WITHOUT CONTRAST TECHNIQUE: Multidetector CT imaging of the abdomen and pelvis was performed following the standard protocol without IV contrast. COMPARISON:  Abdominal ultrasound 03/07/2020 FINDINGS: Lower chest: Bandlike densities in the lung bases more concentrated in the right lower lobe, favoring atelectasis or scarring. Descending thoracic aortic atherosclerosis. Low-density blood pool suggests anemia. Hepatobiliary: Cholecystectomy. Pancreas: Unremarkable Spleen: Unremarkable Adrenals/Urinary Tract: 1.5 by 1.6 cm hypodense lesion of the left kidney upper pole, internal density 12 Hounsfield units on image 27 series 2, probably a cyst but technically nonspecific on today's noncontrast examination. Adrenal glands normal. Exophytic 1.3 by 1.3 cm lesion from the left kidney lower pole, internal density 10 Hounsfield units on image 43/2, probably a cyst but technically nonspecific. A Foley catheter is present in the urinary bladder which is nondistended but thick walled. Small amount of gas in the urinary bladder. Stomach/Bowel: Large periampullary duodenal diverticulum without findings of inflammation. Vascular/Lymphatic: Aortoiliac atherosclerotic vascular disease. Reproductive: Marked prostatomegaly. Other: No supplemental non-categorized findings. Musculoskeletal: There is abnormal gas tracking along the  anterior margin of the left iliopsoas muscle down towards the insertion, and also along the left gluteus musculature and left upper thigh musculature anteriorly, probably related to the patient's left hip surgery this afternoon. Indistinctness of surrounding fascia planes, likely postoperative. Overall the amount of gas and edema in this vicinity is consistent with today's earlier operation. On images 69 and 70 of series 2, there is a trace amount of gas in what appears to be a subtle cortical discontinuity along the anterior wall of the acetabulum which is not readily apparent on the CT from 03/07/2020. The possibility of a subtle fracture in this vicinity is raised, although admittedly the finding is very localized/focal. Severe osteoarthritis of the right hip. Multilevel lumbar spondylosis and degenerative disc disease causing mild degrees of foraminal impingement. IMPRESSION: 1. There is a trace amount of gas in what appears to be a subtle cortical discontinuity along the anterior wall of the acetabulum which is not readily apparent on the hip CT from 03/07/2020. The possibility of a subtle anterior acetabular wall fracture in this vicinity is raised, although admittedly the finding is very localized/focal. 2. Marked prostatomegaly. 3. Other imaging  findings of potential clinical significance: Low-density blood pool suggests anemia. 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 Severe osteoarthritis of the right hip. Expected postoperative findings in the tissue surrounding the left hip prosthesis. Multilevel lumbar spondylosis and degenerative disc disease causing mild degrees of foraminal impingement. 4. Aortic atherosclerosis. Aortic Atherosclerosis (ICD10-I70.0). Electronically Signed   By: Van Clines M.D.   On: 03/09/2020 14:28   DG Hip Port Unilat With Pelvis 1V Left  Result Date: 03/08/2020 CLINICAL DATA:  87 year old male undergoing hip arthroplasty after  traumatic fracture. EXAM: DG HIP (WITH OR WITHOUT PELVIS) 1V PORT LEFT COMPARISON:  Intraoperative images 16 35 hours today. FINDINGS: Portable AP view at 1715 hours. Left hip bipolar arthroplasty demonstrated on this slightly oblique view. Hardware appears intact and normally aligned. Regional postoperative soft tissue gas. Overlying skin staples. No unexpected osseous changes. IMPRESSION: Mildly oblique view of bipolar left hip arthroplasty with no adverse features. Electronically Signed   By: Genevie Ann M.D.   On: 03/08/2020 19:32   DG HIP OPERATIVE UNILAT W OR W/O PELVIS LEFT  Result Date: 03/08/2020 CLINICAL DATA:  87 year old male undergoing hip arthroplasty after traumatic fracture. EXAM: OPERATIVE LEFT HIP (WITH PELVIS IF PERFORMED) 2 VIEWS TECHNIQUE: Fluoroscopic spot image(s) were submitted for interpretation post-operatively. COMPARISON:  LEFT HIP CT 03/07/2020. FINDINGS: Two intraoperative fluoroscopic AP spot views of the left hip demonstrate bipolar arthroplasty underway. No unexpected osseous changes are visible. FLUOROSCOPY TIME:  0 minutes 2 seconds IMPRESSION: Left hip arthroplasty underway. Electronically Signed   By: Genevie Ann M.D.   On: 03/08/2020 19:31   DG Hip Unilat With Pelvis 2-3 Views Left  Result Date: 03/07/2020 CLINICAL DATA:  Left hip pain after fall EXAM: DG HIP (WITH OR WITHOUT PELVIS) 2-3V LEFT COMPARISON:  None. FINDINGS: Cortical irregularity along the lateral aspect of the left femoral neck suspicious for a nondisplaced fracture. No displacement or angulation. Severe degenerative changes of the bilateral hip joints. Pelvic bony ring intact. Advanced vascular calcifications. IMPRESSION: Findings suspicious for a nondisplaced fracture of the left femoral neck. Electronically Signed   By: Davina Poke D.O.   On: 03/07/2020 13:04       Subjective: Patient seen and examined at bedside.  No overnight fever, vomiting, worsening shortness of breath.  Poor  historian.  Discharge Exam: Vitals:   04/02/20 2147 04/03/20 0500  BP: (!) 132/55 (!) 137/58  Pulse: 97 92  Resp: 17 18  Temp:  97.8 F (36.6 C)  SpO2: 96% 95%    General exam: No distress.  Looks chronically ill.  Poor historian. Respiratory system: Decreased breath sounds at bases bilaterally, no wheezing Cardiovascular system: Rate controlled, S1-S2 heard Gastrointestinal system: Abdomen is nondistended, soft and nontender.  Bowel sounds are heard.  PEG tube present.   Extremities: Mild lower extremity edema present.  No clubbing    The results of significant diagnostics from this hospitalization (including imaging, microbiology, ancillary and laboratory) are listed below for reference.     Microbiology: Recent Results (from the past 240 hour(s))  Resp Panel by RT-PCR (Flu A&B, Covid) Nasopharyngeal Swab     Status: None   Collection Time: 03/26/20 10:49 AM   Specimen: Nasopharyngeal Swab; Nasopharyngeal(NP) swabs in vial transport medium  Result Value Ref Range Status   SARS Coronavirus 2 by RT PCR NEGATIVE NEGATIVE Final    Comment: (NOTE) SARS-CoV-2 target nucleic acids are NOT DETECTED.  The SARS-CoV-2 RNA is generally detectable in upper respiratory specimens during the acute phase of  infection. The lowest concentration of SARS-CoV-2 viral copies this assay can detect is 138 copies/mL. A negative result does not preclude SARS-Cov-2 infection and should not be used as the sole basis for treatment or other patient management decisions. A negative result may occur with  improper specimen collection/handling, submission of specimen other than nasopharyngeal swab, presence of viral mutation(s) within the areas targeted by this assay, and inadequate number of viral copies(<138 copies/mL). A negative result must be combined with clinical observations, patient history, and epidemiological information. The expected result is Negative.  Fact Sheet for Patients:   EntrepreneurPulse.com.au  Fact Sheet for Healthcare Providers:  IncredibleEmployment.be  This test is no t yet approved or cleared by the [place] FDA and  has been authorized for detection and/or diagnosis of SARS-CoV-2 by FDA under an Emergency Use Authorization (EUA). This EUA will remain  in effect (meaning this test can be used) for the duration of the COVID-19 declaration under Section 564(b)(1) of the Act, 21 U.S.C.section 360bbb-3(b)(1), unless the authorization is terminated  or revoked sooner.       Influenza A by PCR NEGATIVE NEGATIVE Final   Influenza B by PCR NEGATIVE NEGATIVE Final    Comment: (NOTE) The Xpert Xpress SARS-CoV-2/FLU/RSV plus assay is intended as an aid in the diagnosis of influenza from Nasopharyngeal swab specimens and should not be used as a sole basis for treatment. Nasal washings and aspirates are unacceptable for Xpert Xpress SARS-CoV-2/FLU/RSV testing.  Fact Sheet for Patients: EntrepreneurPulse.com.au  Fact Sheet for Healthcare Providers: IncredibleEmployment.be  This test is not yet approved or cleared by the [place] FDA and has been authorized for detection and/or diagnosis of SARS-CoV-2 by FDA under an Emergency Use Authorization (EUA). This EUA will remain in effect (meaning this test can be used) for the duration of the COVID-19 declaration under Section 564(b)(1) of the Act, 21 U.S.C. section 360bbb-3(b)(1), unless the authorization is terminated or revoked.  Performed at [facility], [place] [address] [place], [place] [address]   Culture, blood (routine x 2)     Status: Abnormal   Collection Time: 03/26/20  6:33 PM   Specimen: BLOOD  Result Value Ref Range Status   Specimen Description   Final    BLOOD LEFT HAND Performed at [place] [address] [place], [place] [address]    Special Requests    Final    BOTTLES DRAWN AEROBIC ONLY Blood Culture adequate volume Performed at [place] [address] [place], [place] [address]    Culture  Setup Time   Final    GRAM POSITIVE COCCI AEROBIC BOTTLE ONLY CRITICAL RESULT CALLED TO, READ BACK BY AND VERIFIED WITH: PHARMD E JACKSON 03/27/20 AT 2213 SK    Culture (A)  Final    ROTHIA MUCILAGINOSA Standardized susceptibility testing for this organism is not available. Performed at [facility], [place] [address] [place], [place] [address]    Report Status 03/29/2020 FINAL  Final  Culture, blood (routine x 2)     Status: None   Collection Time: 03/26/20  6:33 PM   Specimen: BLOOD  Result Value Ref Range Status   Specimen Description   Final    BLOOD LEFT ANTECUBITAL Performed at [place] [address] [place], [place] [address]    Special Requests   Final    BOTTLES DRAWN AEROBIC ONLY Blood Culture adequate volume Performed at [place] [address] [place],  [place] [address]    Culture   Final    NO GROWTH 5 DAYS Performed at [facility], [place] [address], [place], [place] [address]    Report Status 03/31/2020 FINAL  Final  Blood Culture ID Panel (Reflexed)     Status: None   Collection Time: 03/26/20  6:33 PM  Result Value Ref Range Status   Enterococcus faecalis NOT DETECTED NOT DETECTED Final   Enterococcus Faecium NOT DETECTED NOT DETECTED Final   Listeria monocytogenes NOT DETECTED NOT DETECTED Final   Staphylococcus species NOT DETECTED NOT DETECTED Final   Staphylococcus aureus (BCID) NOT DETECTED NOT DETECTED Final   Staphylococcus epidermidis NOT DETECTED NOT DETECTED Final   Staphylococcus lugdunensis NOT DETECTED NOT DETECTED Final   Streptococcus species NOT DETECTED NOT DETECTED Final   Streptococcus agalactiae NOT DETECTED NOT DETECTED Final   Streptococcus pneumoniae NOT DETECTED NOT DETECTED Final   Streptococcus pyogenes NOT  DETECTED NOT DETECTED Final   A.calcoaceticus-baumannii NOT DETECTED NOT DETECTED Final   Bacteroides fragilis NOT DETECTED NOT DETECTED Final   Enterobacterales NOT DETECTED NOT DETECTED Final   Enterobacter cloacae complex NOT DETECTED NOT DETECTED Final   Escherichia coli NOT DETECTED NOT DETECTED Final   Klebsiella aerogenes NOT DETECTED NOT DETECTED Final   Klebsiella oxytoca NOT DETECTED NOT DETECTED Final   Klebsiella pneumoniae NOT DETECTED NOT DETECTED Final   Proteus species NOT DETECTED NOT DETECTED Final   Salmonella species NOT DETECTED NOT DETECTED Final   Serratia marcescens NOT DETECTED NOT DETECTED Final   Haemophilus influenzae NOT DETECTED NOT DETECTED Final   Neisseria meningitidis NOT DETECTED NOT DETECTED Final   Pseudomonas aeruginosa NOT DETECTED NOT DETECTED Final   Stenotrophomonas maltophilia NOT DETECTED NOT DETECTED Final   Candida albicans NOT DETECTED NOT DETECTED Final   Candida auris NOT DETECTED NOT DETECTED Final   Candida glabrata NOT DETECTED NOT DETECTED Final   Candida krusei NOT DETECTED NOT DETECTED Final   Candida parapsilosis NOT DETECTED NOT DETECTED Final   Candida tropicalis NOT DETECTED NOT DETECTED Final   Cryptococcus neoformans/gattii NOT DETECTED NOT DETECTED Final    Comment: Performed at [facility], [address] [place], [place] [address]  Culture, blood (Routine X 2) w Reflex to ID Panel     Status: None (Preliminary result)   Collection Time: 04/01/20 11:47 AM   Specimen: BLOOD  Result Value Ref Range Status   Specimen Description   Final    BLOOD LEFT ANTECUBITAL Performed at [facility], [place] [address], [place], [place] [address]    Special Requests   Final    BOTTLES DRAWN AEROBIC AND ANAEROBIC Blood Culture adequate volume Performed at [place] [address], [place], [place] [address]    Culture   Final    NO GROWTH < 24 HOURS Performed at [place] [address] [place], [place] [address]    Report Status PENDING  Incomplete  Culture, blood (Routine X 2) w Reflex to ID Panel     Status: None (Preliminary result)   Collection Time: 04/01/20 11:57 AM   Specimen: BLOOD  Result Value Ref Range Status   Specimen Description   Final    BLOOD RIGHT ANTECUBITAL Performed at [facility], [place] [address], [place], [place] [address]    Special Requests   Final    BOTTLES DRAWN AEROBIC AND ANAEROBIC Blood Culture adequate volume Performed at [facility], [address] [place], [place] [address]    Culture   Final    NO GROWTH < 24 HOURS Performed at [place] [address], [place], [place] [address]    Report Status PENDING  Incomplete     Labs: BNP (last 3 results) No results for input(s): BNP in the last 8760 hours. Basic Metabolic Panel: Recent Labs  Lab 03/30/20 0514 03/30/20 1714 03/31/20 0503 04/01/20 0354 04/02/20 0511 04/03/20 0551  NA 144  --  139 139 137 136  K 3.4*  --  3.7 4.3 5.1 4.9  CL 113*  --  111 109 105 102  CO2 22  --  22 22 24 25   GLUCOSE 110*  --  232* 213* 269* 312*  BUN 18  --  19 21 30* 36*  CREATININE 1.43*  --  1.57* 1.62* 1.59* 1.60*  CALCIUM 7.7*  --  7.2* 7.1* 7.3* 7.9*  MG 1.9 2.4 2.1 2.0 2.0 1.9  PHOS 2.4* 4.2 2.4* 2.8 1.9*  --    Liver Function Tests: Recent Labs  Lab 04/01/20 0354 04/02/20 0511  ALBUMIN 2.2* 2.2*   No results for input(s): LIPASE, AMYLASE in the last 168 hours. No results for input(s): AMMONIA in the last 168 hours. CBC: Recent Labs  Lab 03/28/20 0546 03/29/20 0447 03/30/20 0514 03/31/20 0503 04/01/20 0354 04/02/20 0511 04/03/20 0551  WBC 6.0 6.9 8.2 8.6  --  6.6 6.8  NEUTROABS 4.5 5.3  --   --   --  4.6 4.3  HGB 8.8* 9.1* 9.1* 8.0* 7.8* 7.6* 7.3*  HCT 28.6* 29.0* 28.9* 25.3* 25.3* 24.8* 23.9*  MCV 93.2 90.9 91.7 91.0  --  92.5 93.0  PLT 290 312 295 241  --  210 185   Cardiac Enzymes: No results for input(s): CKTOTAL, CKMB,  CKMBINDEX, TROPONINI in the last 168 hours. BNP: Invalid input(s): POCBNP CBG: Recent Labs  Lab 04/01/20 0738 04/01/20 2132 04/02/20 0757 04/02/20 1150 04/02/20 2144  GLUCAP 223* 235* 243* 275* 234*   D-Dimer No results for input(s): DDIMER in the last 72 hours. Hgb A1c No results for input(s): HGBA1C in the last 72 hours. Lipid Profile No results for input(s): CHOL, HDL, LDLCALC, TRIG, CHOLHDL, LDLDIRECT in the last 72 hours. Thyroid function studies No results for input(s): TSH, T4TOTAL, T3FREE, THYROIDAB in the last 72 hours.  Invalid input(s): FREET3 Anemia work up No results for input(s): VITAMINB12, FOLATE, FERRITIN, TIBC, IRON, RETICCTPCT in the last 72 hours. Urinalysis    Component Value Date/Time   COLORURINE YELLOW 03/07/2020 1250   APPEARANCEUR CLEAR 03/07/2020 1250   LABSPEC 1.012 03/07/2020 1250   PHURINE 5.0 03/07/2020 1250   GLUCOSEU NEGATIVE 03/07/2020 1250   GLUCOSEU 500 (A) 07/11/2014 0850   HGBUR SMALL (A) 03/07/2020 1250   BILIRUBINUR NEGATIVE 03/07/2020 1250   KETONESUR NEGATIVE 03/07/2020 1250   PROTEINUR 100 (A) 03/07/2020 1250   UROBILINOGEN 0.2 07/11/2014 0850   NITRITE NEGATIVE 03/07/2020 1250   LEUKOCYTESUR NEGATIVE 03/07/2020 1250   Sepsis Labs Invalid input(s): PROCALCITONIN,  WBC,  LACTICIDVEN Microbiology Recent Results (from the past 240 hour(s))  Resp Panel by RT-PCR (Flu A&B, Covid) Nasopharyngeal Swab     Status: None   Collection Time: 03/26/20 10:49 AM   Specimen: Nasopharyngeal Swab; Nasopharyngeal(NP) swabs in vial transport medium  Result Value Ref Range Status   SARS Coronavirus 2 by RT PCR NEGATIVE NEGATIVE Final    Comment: (NOTE) SARS-CoV-2 target nucleic acids are NOT DETECTED.  The SARS-CoV-2  RNA is generally detectable in upper respiratory specimens during the acute phase of infection. The lowest concentration of SARS-CoV-2 viral copies this assay can detect is 138 copies/mL. A negative result does not preclude  SARS-Cov-2 infection and should not be used as the sole basis for treatment or other patient management decisions. A negative result may occur with  improper specimen collection/handling, submission of specimen other than nasopharyngeal swab, presence of viral mutation(s) within the areas targeted by this assay, and inadequate number of viral copies(<138 copies/mL). A negative result must be combined with clinical observations, patient history, and epidemiological information. The expected result is Negative.  Fact Sheet for Patients:  EntrepreneurPulse.com.au  Fact Sheet for Healthcare Providers:  IncredibleEmployment.be  This test is no t yet approved or cleared by the [place] FDA and  has been authorized for detection and/or diagnosis of SARS-CoV-2 by FDA under an Emergency Use Authorization (EUA). This EUA will remain  in effect (meaning this test can be used) for the duration of the COVID-19 declaration under Section 564(b)(1) of the Act, 21 U.S.C.section 360bbb-3(b)(1), unless the authorization is terminated  or revoked sooner.       Influenza A by PCR NEGATIVE NEGATIVE Final   Influenza B by PCR NEGATIVE NEGATIVE Final    Comment: (NOTE) The Xpert Xpress SARS-CoV-2/FLU/RSV plus assay is intended as an aid in the diagnosis of influenza from Nasopharyngeal swab specimens and should not be used as a sole basis for treatment. Nasal washings and aspirates are unacceptable for Xpert Xpress SARS-CoV-2/FLU/RSV testing.  Fact Sheet for Patients: EntrepreneurPulse.com.au  Fact Sheet for Healthcare Providers: IncredibleEmployment.be  This test is not yet approved or cleared by the [place] FDA and has been authorized for detection and/or diagnosis of SARS-CoV-2 by FDA under an Emergency Use Authorization (EUA). This EUA will remain in effect (meaning this test can be used) for the duration of  the COVID-19 declaration under Section 564(b)(1) of the Act, 21 U.S.C. section 360bbb-3(b)(1), unless the authorization is terminated or revoked.  Performed at [facility], [place] [address], [place], [place] [address]   Culture, blood (routine x 2)     Status: Abnormal   Collection Time: 03/26/20  6:33 PM   Specimen: BLOOD  Result Value Ref Range Status   Specimen Description   Final    BLOOD LEFT HAND Performed at [place] [address] [place], [place] [address]    Special Requests   Final    BOTTLES DRAWN AEROBIC ONLY Blood Culture adequate volume Performed at [place] [address] [place], [place] [address]    Culture  Setup Time   Final    GRAM POSITIVE COCCI AEROBIC BOTTLE ONLY CRITICAL RESULT CALLED TO, READ BACK BY AND VERIFIED WITH: PHARMD E JACKSON 03/27/20 AT 2213 SK    Culture (A)  Final    ROTHIA MUCILAGINOSA Standardized susceptibility testing for this organism is not available. Performed at [facility], [place] [address], [place], [place] [address]    Report Status 03/29/2020 FINAL  Final  Culture, blood (routine x 2)     Status: None   Collection Time: 03/26/20  6:33 PM   Specimen: BLOOD  Result Value Ref Range Status   Specimen Description   Final    BLOOD LEFT ANTECUBITAL Performed at [place] [address], [place], [place] [address]    Special Requests   Final    BOTTLES DRAWN AEROBIC ONLY Blood Culture  adequate volume Performed at [place] [address] [place], [place] [address]    Culture   Final    NO GROWTH 5 DAYS Performed at [facility], [place] [address] [place], [place] [address]    Report Status 03/31/2020 FINAL  Final  Blood Culture ID Panel (Reflexed)     Status: None   Collection Time: 03/26/20  6:33 PM  Result Value Ref Range Status   Enterococcus faecalis NOT DETECTED NOT DETECTED Final   Enterococcus  Faecium NOT DETECTED NOT DETECTED Final   Listeria monocytogenes NOT DETECTED NOT DETECTED Final   Staphylococcus species NOT DETECTED NOT DETECTED Final   Staphylococcus aureus (BCID) NOT DETECTED NOT DETECTED Final   Staphylococcus epidermidis NOT DETECTED NOT DETECTED Final   Staphylococcus lugdunensis NOT DETECTED NOT DETECTED Final   Streptococcus species NOT DETECTED NOT DETECTED Final   Streptococcus agalactiae NOT DETECTED NOT DETECTED Final   Streptococcus pneumoniae NOT DETECTED NOT DETECTED Final   Streptococcus pyogenes NOT DETECTED NOT DETECTED Final   A.calcoaceticus-baumannii NOT DETECTED NOT DETECTED Final   Bacteroides fragilis NOT DETECTED NOT DETECTED Final   Enterobacterales NOT DETECTED NOT DETECTED Final   Enterobacter cloacae complex NOT DETECTED NOT DETECTED Final   Escherichia coli NOT DETECTED NOT DETECTED Final   Klebsiella aerogenes NOT DETECTED NOT DETECTED Final   Klebsiella oxytoca NOT DETECTED NOT DETECTED Final   Klebsiella pneumoniae NOT DETECTED NOT DETECTED Final   Proteus species NOT DETECTED NOT DETECTED Final   Salmonella species NOT DETECTED NOT DETECTED Final   Serratia marcescens NOT DETECTED NOT DETECTED Final   Haemophilus influenzae NOT DETECTED NOT DETECTED Final   Neisseria meningitidis NOT DETECTED NOT DETECTED Final   Pseudomonas aeruginosa NOT DETECTED NOT DETECTED Final   Stenotrophomonas maltophilia NOT DETECTED NOT DETECTED Final   Candida albicans NOT DETECTED NOT DETECTED Final   Candida auris NOT DETECTED NOT DETECTED Final   Candida glabrata NOT DETECTED NOT DETECTED Final   Candida krusei NOT DETECTED NOT DETECTED Final   Candida parapsilosis NOT DETECTED NOT DETECTED Final   Candida tropicalis NOT DETECTED NOT DETECTED Final   Cryptococcus neoformans/gattii NOT DETECTED NOT DETECTED Final    Comment: Performed at [facility], [address] [place], [place] [address]  Culture, blood (Routine X 2) w Reflex to ID Panel      Status: None (Preliminary result)   Collection Time: 04/01/20 11:47 AM   Specimen: BLOOD  Result Value Ref Range Status   Specimen Description   Final    BLOOD LEFT ANTECUBITAL Performed at [facility], [place] [address] [place], [place] [address]    Special Requests   Final    BOTTLES DRAWN AEROBIC AND ANAEROBIC Blood Culture adequate volume Performed at [place] [address] [place], [place] [address]    Culture   Final    NO GROWTH < 24 HOURS Performed at [place] [address] [place], [place] [address]    Report Status PENDING  Incomplete  Culture, blood (Routine X 2) w Reflex to ID Panel     Status: None (Preliminary result)   Collection Time: 04/01/20 11:57 AM   Specimen: BLOOD  Result Value Ref Range Status   Specimen Description   Final    BLOOD RIGHT ANTECUBITAL Performed at [facility], [place] [address], [place], [place] [address]    Special Requests   Final    BOTTLES DRAWN AEROBIC  AND ANAEROBIC Blood Culture adequate volume Performed at [place] [address], [place], [place] [address]    Culture   Final    NO GROWTH < 24 HOURS Performed at [place] [address] [place], [place] [address]    Report Status PENDING  Incomplete     Time coordinating discharge: 35 minutes  SIGNED:   Aline August, MD  [facility] 04/03/2020, 12:09 PM

## 2020-04-03 NOTE — Progress Notes (Addendum)
Nutrition Follow-up  DOCUMENTATION CODES:   Severe malnutrition in context of chronic illness  INTERVENTION:  - will change TF regimen: 1 carton (237 ml) Osmolite 1.5 x5/day with 45 ml Prosource TF once/day, and 120 ml free water/TF bolus.  - this regimen will provide 1815 kcal, 85 grams protein (94% protein need), and 1505 ml free water.  NUTRITION DIAGNOSIS:   Severe Malnutrition related to chronic illness,dysphagia as evidenced by percent weight loss,energy intake < or equal to 75% for > or equal to 1 month,severe fat depletion,severe muscle depletion. -ongoing  GOAL:   Patient will meet greater than or equal to 90% of their needs -to be met with PO intakes and TF  MONITOR:   PO intake,Supplement acceptance,Labs,Weight trends,I & O's,Skin  REASON FOR ASSESSMENT:   Consult Enteral/tube feeding initiation and management  ASSESSMENT:   87 year old male with history of hypertension, hyperlipidemia, dysphagia who presented from [facility] facility for the evaluation of dysphagia, fever.  He recently had hip replacement and is currently residing in a skilled nursing facility.  He is mostly bedbound since last few weeks.  Most of the information was taken from his wife.  As per the wife, he had swallowing issues about 10 years ago which automatically resolved after he was giving some antiallergic medication/Benadryl.  He had this issue again few weeks ago and he was worked up extensively at [facility].  He underwent EGD, swallowing evaluation; the studies were largely unremarkable except for mild esophagitis.  He was then sent back to skilled nursing facility.  GI/speech following here.  Patient and family are interested on PEG placement if he is found to have persistent dysphagia.  Diet changed from CLD to NPO on 12/29 at 1350. SLP eval done this AM per wife, who is at bedside. Wife reports patient is now able to have PO items and that they both are  aware of techniques to aid in safe swallowing.   Patient is currently receiving TF regimen at goal rate: Osmolite 1.5 @ 60 ml/hr which is providing 2160 kcal, 97 grams protein, and 1097 ml free water.   Patient laying in bed at the time of visit and was in and out of sleep. Let wife know about plan to change from continuous TF to bolus TF regimen.   He has not been weighed since 12/26.  Patient to d/c to SNF today.     Labs reviewed; BUN: 36 mg/dl, creatinine: 1.6 mg/dl, Ca: 7.9 mg/dl, GFR: 42 ml/min.  Medications reviewed; sliding scale novolog, 15 units lantus/day, 15 ml multivitamin with minerals/day, 40 mg IV protonix BID, 1 g carafate TID.    Diet Order:   Diet Order            Diet - low sodium heart healthy           Diet NPO time specified Except for: Ice Chips  Diet effective now                 EDUCATION NEEDS:   Education needs have been addressed  Skin:  Skin Assessment: Skin Integrity Issues: Skin Integrity Issues:: Incisions,Unstageable Unstageable: right lateral heel PI Incisions: left hip  Last BM:  12/30  Height:   Ht Readings from Last 1 Encounters:  03/26/20 5' 6"  (1.676 m)    Weight:   Wt Readings from Last 1 Encounters:  03/26/20 61.2 kg     Estimated Nutritional Needs:  Kcal:  1800-2000 Protein:  90-100g Fluid:  2L/day     Jarome Matin, MS, RD, LDN, CNSC Inpatient Clinical Dietitian RD pager # available in [place]  After hours/weekend pager # available in [facility]

## 2020-04-03 NOTE — NC FL2 (Signed)
[place] LEVEL OF CARE SCREENING TOOL     IDENTIFICATION  Patient Name: John Walton Birthdate: May 13, 1932 Sex: male Admission Date (Current Location): 03/26/2020  [facility] and [place] Number:  Herbalist and Address:  [facility],  [address] [place], [place]      Provider Number: [id number]  Attending Physician Name and Address:  Aline August, MD  Relative Name and Phone Number:  Gershon Cull [phone]    Current Level of Care: Hospital Recommended Level of Care: [place] Prior Approval Number:    Date Approved/Denied:   PASRR Number: [id number]  Discharge Plan: SNF    Current Diagnoses: Patient Active Problem List   Diagnosis Date Noted  . Hypocalcemia   . Protein-calorie malnutrition, severe 03/29/2020  . Hypokalemia 03/29/2020  . Hypernatremia 03/29/2020  . DM (diabetes mellitus), type 2 ([place]) 03/29/2020  . Fever 03/29/2020  . Debility 03/29/2020  . Dysphagia 03/26/2020  . Bilateral hydronephrosis 03/08/2020  . Unilateral primary osteoarthritis, left hip   . Femoral neck fracture ([place]) 03/07/2020  . CKD (chronic kidney disease), stage IV ([place]) 03/07/2020  . Normocytic anemia 03/07/2020  . BPH (benign prostatic hyperplasia) 03/07/2020  . Abdominal mass 03/07/2020  . Scalp hematoma 03/07/2020  . Hypertensive urgency 03/07/2020  . Pain due to onychomycosis of toenails of both feet 08/13/2019  . RBBB 09/02/2014  . Screening, ischemic heart disease 07/11/2014  . Medicare annual wellness visit, subsequent 07/11/2014  . Prostate cancer screening 07/11/2014  . Essential hypertension 01/11/2014  . Hyperlipidemia 01/11/2014    Orientation RESPIRATION BLADDER Height & Weight     Self,Time,Situation,Place  Normal External catheter Weight: 135 lb (61.2 kg) Height:  5\' 6"  (167.6 cm)  BEHAVIORAL SYMPTOMS/MOOD NEUROLOGICAL BOWEL NUTRITION STATUS      Continent Feeding tube  AMBULATORY  STATUS COMMUNICATION OF NEEDS Skin   Extensive Assist Verbally Other (Comment) (dressing change bid to right heel)                       Personal Care Assistance Level of Assistance  Bathing,Dressing Bathing Assistance: Limited assistance Feeding assistance: Limited assistance Dressing Assistance: Limited assistance     Functional Limitations Info  Sight,Hearing,Speech Sight Info: Adequate Hearing Info: Impaired Speech Info: Adequate    SPECIAL CARE FACTORS FREQUENCY  PT (By licensed PT),OT (By licensed OT)     PT Frequency: 5x/wk OT Frequency: 5x/wk            Contractures Contractures Info: Not present    Additional Factors Info  Code Status,Allergies Code Status Info: Full Allergies Info: see MAR           Current Medications (04/03/2020):  This is the current hospital active medication list Current Facility-Administered Medications  Medication Dose Route Frequency Provider Last Rate Last Admin  . (feeding supplement) PROSource Plus liquid 30 mL  30 mL Oral BID WC Adhikari, Amrit, MD   30 mL at 04/03/20 0802  . acetaminophen (TYLENOL) tablet 650 mg  650 mg Oral Q6H PRN Marylyn Ishihara, Tyrone A, DO       Or  . acetaminophen (TYLENOL) suppository 650 mg  650 mg Rectal Q6H PRN Marylyn Ishihara, Tyrone A, DO      . alum & mag hydroxide-simeth (MAALOX/MYLANTA) 200-200-20 MG/5ML suspension 30 mL  30 mL Oral Once Curatolo, Adam, DO       And  . lidocaine (XYLOCAINE) 2 % viscous mouth solution 15 mL  15 mL Oral Once Curatolo,  Adam, DO      . calcium-vitamin D (OSCAL WITH D) 500-200 MG-UNIT per tablet 2 tablet  2 tablet Per Tube TID with meals Eugenie Filler, MD   2 tablet at 04/03/20 1235  . Chlorhexidine Gluconate Cloth 2 % PADS 6 each  6 each Topical Daily Shelly Coss, MD   6 each at 04/03/20 0913  . feeding supplement (OSMOLITE 1.5 CAL) liquid 237 mL  237 mL Per Tube 5 X Daily Alekh, Kshitiz, MD      . free water 120 mL  120 mL Per Tube 5 X Daily Alekh, Kshitiz, MD      .  heparin injection 5,000 Units  5,000 Units Subcutaneous Q8H Kyle, Tyrone A, DO   5,000 Units at 04/03/20 0521  . insulin aspart (novoLOG) injection 0-5 Units  0-5 Units Subcutaneous QHS Shelly Coss, MD   2 Units at 04/02/20 2207  . insulin aspart (novoLOG) injection 0-9 Units  0-9 Units Subcutaneous TID WC Shelly Coss, MD   3 Units at 04/03/20 1236  . insulin glargine (LANTUS) injection 15 Units  15 Units Subcutaneous Daily Aline August, MD   15 Units at 04/03/20 0926  . latanoprost (XALATAN) 0.005 % ophthalmic solution 1 drop  1 drop Both Eyes QHS Kyle, Tyrone A, DO   1 drop at 04/02/20 2207  . morphine 2 MG/ML injection 2 mg  2 mg Intravenous Q4H PRN Marylyn Ishihara, Tyrone A, DO      . multivitamin liquid 15 mL  15 mL Oral Daily Shelly Coss, MD   15 mL at 04/03/20 0923  . ondansetron (ZOFRAN) injection 4 mg  4 mg Intravenous Q6H PRN Aline August, MD   4 mg at 04/03/20 1028  . pantoprazole (PROTONIX) injection 40 mg  40 mg Intravenous Q12H Salley Slaughter, PA-C   40 mg at 04/03/20 5364  . polyethylene glycol (MIRALAX / GLYCOLAX) packet 17 g  17 g Per Tube Daily PRN Eugenie Filler, MD      . polyvinyl alcohol (LIQUIFILM TEARS) 1.4 % ophthalmic solution 1 drop  1 drop Both Eyes TID Marylyn Ishihara, Tyrone A, DO   1 drop at 04/03/20 0930  . silodosin (RAPAFLO) capsule 4 mg  4 mg Per Tube Daily Aline August, MD   4 mg at 04/03/20 0929  . sucralfate (CARAFATE) 1 GM/10ML suspension 1 g  1 g Oral TID WC & HS Baron-Johnson, Bryson Ha, PA-C   1 g at 04/03/20 1238     Discharge Medications: Please see discharge summary for a list of discharge medications.  Relevant Imaging Results:  Relevant Lab Results:   Additional Information [id number]  Michie Molnar, LCSW

## 2020-04-03 NOTE — Progress Notes (Signed)
Modified Barium Swallow Progress Note  Patient Details  Name: TREVONNE NYLAND MRN: [id number] Date of Birth: 01-10-1933  Today's Date: 04/03/2020  Modified Barium Swallow completed.  Full report located under Chart Review in the Imaging Section.  Brief recommendations include the following:  Clinical Impression  Wet voice noted at baseline prior to administration of barium.  Moderate pharyngeal-cervical esophageal dysphagia obstructive in nature due to DISH but also with impact from deconditioning.  Impaired epiglottic deflection as it contacted posterior pharynx and thus allowed significant retention at valleculae space.  In addition, decreased laryngeal elevation allowed spillage of thinner barium into slightly open larynx with swallowing. Pyriform sinsus retention also infiltrated slightly into larynx after swallow.  Trace laryngeal penetration with thin to vocal cords was sensed subtly as pt cleared his throat.  Effortful swallow, swallowing twice with each bolus and "hock" with expectoration effective to protect airway.  Pt did not consistently sense retention in pharynx and secretions were mixed with retention.  Head turn slight right/left did not prevent retention.  Recommend floor stock of nectar thick liquids via cup and thin water only.  Follow up with SLP advised to strengthen tongue base retraction, expectoration and laryngeal elevation.  Using teach back, pt educated to recommendations and findings. SLP tubed swallow precaution signs to the floor and informed RN of recommendations.   Recommend SLP at SNF address dysphagia with po trials of soft solids/thins with strict precautions.  Of note, pt now admits to occasionally having to cough up food at home prior to hip fx.  Educated wife and pt to recomemndations using teach back and live with pt during testing.   Swallow Evaluation Recommendations       SLP Diet Recommendations:  (nectar thick via floor stock and thin water)   Liquid  Administration via: Cup   Medication Administration: Via alternative means       Compensations: Multiple dry swallows after each bite/sip (expectorate after each double swallow with bolus)   Postural Changes: Seated upright at 90 degrees;Remain semi-upright after after feeds/meals (Comment)   Oral Care Recommendations: Oral care QID   Other Recommendations: Order thickener from [facility] [place] Office [phone] Pager [phone]    [name] 04/03/2020,12:26 PM

## 2020-04-03 NOTE — Progress Notes (Signed)
Patient ID: John WELCHER, male   DOB: 1932-09-07, 87 y.o.   MRN: [id number]  PROGRESS NOTE    VED MARTOS  DXA:[id number] DOB: Jul 12, 1932 DOA: 03/26/2020 PCP: [name], MD   Brief Narrative:  87 year old male with history of hypertension, hyperlipidemia, dysphagia who presented from [place] facility for the evaluation of dysphagia, fever. He recently had hip replacement and is currently residing in a skilled nursing facility. He is mostly bedbound since last few weeks. As per the wife, he had swallowing issues about 10 years ago which automatically resolved after he was giving some antiallergic medication/Benadryl. He had this issue again few weeks ago and he was worked up extensively at [facility]. He underwent EGD, swallowing evaluation; the studies were largely unremarkable except for mild esophagitis. He was then sent back to skilled nursing facility.  GI was consulted for dysphagia.  GI recommended PEG tube placement.  He underwent PEG tube placement by IR on 03/29/2020.  Assessment & Plan:   Dysphagia: Most likely due to dysmotility Severe protein calorie malnutrition -GI following and had recommended PEG tube placement.  Status post gastrostomy tube placement by IR on 03/29/2020.  Tube feeding started on 03/30/2020 and patient is tolerating. -GI recommending modified barium swallow early next week or as per SLP recommendations. Will get SLP involved again - will request dietary team to reevaluate regarding discharge to feeding plan  Chronic urinary retention with chronic indwelling Foley catheter -Patient was discharged to rehab with Foley catheter from [facility].  Patient recommended to follow-up with urology at [facility] on discharge.  Continue Flomax.  Outpatient follow-up with urology.   DISH -CT head and neck was ordered to evaluate for other potential causes at [facility].  CT head and neck  demonstrated DISH with large osteophyte at C5-C6 causing mass-effect on the esophagus.  Same finding noted on imaging in [place] in 2011.  Spinal surgery was consulted at [facility] and concluded no safe way for removal of osteophyte, so nonsurgical management was recommended.  Outpatient follow-up.  Hypernatremia -Resolved with IV hydration  Hyperlipidemia -Noted to be on statin prior to admission  CKD stage IV -Currently at baseline.  Outpatient follow-up with nephrology  Diabetes mellitus type 2 -A1c 6.7 on 03/07/2020.  Continue Lantus and increase to 15 units daily.  Continue CBGs with SSI  Normocytic anemia/iron deficiency anemia -Likely due to poor oral intake.  No overt bleeding.  Anemia panel consistent with low iron.  Status post IV iron treatment.  Hemoglobin 7.6.  Transfuse if hemoglobin is less than 7.  Outpatient follow-up with GI.  Will likely need oral iron supplementation on discharge  Debility/generalized conditioning -Recently hospitalized after mechanical fall with left hip fracture requiring surgery on 03/08/2020 and discharged to SNF. -PT/OT recommending SNF -Palliative care consultation for goals of care  Probable blood culture contamination -1 out of 2 set of blood cultures were positive for [place]: Most likely contaminant.  Currently afebrile with no leukocytosis.  No need for antibiotics at this time  Hypocalcemia -Continue supplementation   DVT prophylaxis: Heparin Code Status: Full Family Communication: Wife at bedside Disposition Plan: Status is: Inpatient  Remains inpatient appropriate because:Inpatient level of care appropriate due to severity of illness   Dispo: The patient is from: Home              Anticipated d/c is to: SNF  Anticipated d/c date is: 1 day              Patient currently is not medically stable to d/c.  Consultants: GI/IR  Procedures: Gastrostomy tube placement by IR on 03/29/2020  Antimicrobials:  None   Subjective: Patient seen and examined at bedside.  No overnight fever, vomiting, worsening shortness of breath.  Poor historian. Objective: Vitals:   04/02/20 0546 04/02/20 1300 04/02/20 2147 04/03/20 0500  BP: (!) 128/51 (!) 134/50 (!) 132/55 (!) 137/58  Pulse: 89 75 97 92  Resp: 18 (!) 22 17 18   Temp: 97.8 F (36.6 C) 98.1 F (36.7 C)  97.8 F (36.6 C)  TempSrc:  Oral  Oral  SpO2: 97% 98% 96% 95%  Weight:      Height:        Intake/Output Summary (Last 24 hours) at 04/03/2020 0745 Last data filed at 04/03/2020 0600 Gross per 24 hour  Intake 2160 ml  Output 1600 ml  Net 560 ml   Filed Weights   03/26/20 1727  Weight: 61.2 kg    Examination:  General exam: No distress.  Looks chronically ill.  Poor historian. Respiratory system: Decreased breath sounds at bases bilaterally, no wheezing Cardiovascular system: Rate controlled, S1-S2 heard Gastrointestinal system: Abdomen is nondistended, soft and nontender.  Bowel sounds are heard.  PEG tube present.   Extremities: Mild lower extremity edema present.  No clubbing   Data Reviewed: I have personally reviewed following labs and imaging studies  CBC: Recent Labs  Lab 03/28/20 0546 03/29/20 0447 03/30/20 0514 03/31/20 0503 04/01/20 0354 04/02/20 0511 04/03/20 0551  WBC 6.0 6.9 8.2 8.6  --  6.6 6.8  NEUTROABS 4.5 5.3  --   --   --  4.6 4.3  HGB 8.8* 9.1* 9.1* 8.0* 7.8* 7.6* 7.3*  HCT 28.6* 29.0* 28.9* 25.3* 25.3* 24.8* 23.9*  MCV 93.2 90.9 91.7 91.0  --  92.5 93.0  PLT 290 312 295 241  --  210 846   Basic Metabolic Panel: Recent Labs  Lab 03/30/20 0514 03/30/20 1714 03/31/20 0503 04/01/20 0354 04/02/20 0511 04/03/20 0551  NA 144  --  139 139 137 136  K 3.4*  --  3.7 4.3 5.1 4.9  CL 113*  --  111 109 105 102  CO2 22  --  22 22 24 25   GLUCOSE 110*  --  232* 213* 269* 312*  BUN 18  --  19 21 30* 36*  CREATININE 1.43*  --  1.57* 1.62* 1.59* 1.60*  CALCIUM 7.7*  --  7.2* 7.1* 7.3* 7.9*  MG 1.9 2.4  2.1 2.0 2.0 1.9  PHOS 2.4* 4.2 2.4* 2.8 1.9*  --    GFR: Estimated Creatinine Clearance: 29.2 mL/min (A) (by C-G formula based on SCr of 1.6 mg/dL (H)). Liver Function Tests: Recent Labs  Lab 04/01/20 0354 04/02/20 0511  ALBUMIN 2.2* 2.2*   No results for input(s): LIPASE, AMYLASE in the last 168 hours. No results for input(s): AMMONIA in the last 168 hours. Coagulation Profile: No results for input(s): INR, PROTIME in the last 168 hours. Cardiac Enzymes: No results for input(s): CKTOTAL, CKMB, CKMBINDEX, TROPONINI in the last 168 hours. BNP (last 3 results) No results for input(s): PROBNP in the last 8760 hours. HbA1C: No results for input(s): HGBA1C in the last 72 hours. CBG: Recent Labs  Lab 04/01/20 0738 04/01/20 2132 04/02/20 0757 04/02/20 1150 04/02/20 2144  GLUCAP 223* 235* 243* 275* 234*   Lipid Profile: No results  for input(s): CHOL, HDL, LDLCALC, TRIG, CHOLHDL, LDLDIRECT in the last 72 hours. Thyroid Function Tests: No results for input(s): TSH, T4TOTAL, FREET4, T3FREE, THYROIDAB in the last 72 hours. Anemia Panel: No results for input(s): VITAMINB12, FOLATE, FERRITIN, TIBC, IRON, RETICCTPCT in the last 72 hours. Sepsis Labs: No results for input(s): PROCALCITON, LATICACIDVEN in the last 168 hours.  Recent Results (from the past 240 hour(s))  Resp Panel by RT-PCR (Flu A&B, Covid) Nasopharyngeal Swab     Status: None   Collection Time: 03/26/20 10:49 AM   Specimen: Nasopharyngeal Swab; Nasopharyngeal(NP) swabs in vial transport medium  Result Value Ref Range Status   SARS Coronavirus 2 by RT PCR NEGATIVE NEGATIVE Final    Comment: (NOTE) SARS-CoV-2 target nucleic acids are NOT DETECTED.  The SARS-CoV-2 RNA is generally detectable in upper respiratory specimens during the acute phase of infection. The lowest concentration of SARS-CoV-2 viral copies this assay can detect is 138 copies/mL. A negative result does not preclude SARS-Cov-2 infection and should  not be used as the sole basis for treatment or other patient management decisions. A negative result may occur with  improper specimen collection/handling, submission of specimen other than nasopharyngeal swab, presence of viral mutation(s) within the areas targeted by this assay, and inadequate number of viral copies(<138 copies/mL). A negative result must be combined with clinical observations, patient history, and epidemiological information. The expected result is Negative.  Fact Sheet for Patients:  EntrepreneurPulse.com.au  Fact Sheet for Healthcare Providers:  IncredibleEmployment.be  This test is no t yet approved or cleared by the [place] FDA and  has been authorized for detection and/or diagnosis of SARS-CoV-2 by FDA under an Emergency Use Authorization (EUA). This EUA will remain  in effect (meaning this test can be used) for the duration of the COVID-19 declaration under Section 564(b)(1) of the Act, 21 U.S.C.section 360bbb-3(b)(1), unless the authorization is terminated  or revoked sooner.       Influenza A by PCR NEGATIVE NEGATIVE Final   Influenza B by PCR NEGATIVE NEGATIVE Final    Comment: (NOTE) The Xpert Xpress SARS-CoV-2/FLU/RSV plus assay is intended as an aid in the diagnosis of influenza from Nasopharyngeal swab specimens and should not be used as a sole basis for treatment. Nasal washings and aspirates are unacceptable for Xpert Xpress SARS-CoV-2/FLU/RSV testing.  Fact Sheet for Patients: EntrepreneurPulse.com.au  Fact Sheet for Healthcare Providers: IncredibleEmployment.be  This test is not yet approved or cleared by the [place] FDA and has been authorized for detection and/or diagnosis of SARS-CoV-2 by FDA under an Emergency Use Authorization (EUA). This EUA will remain in effect (meaning this test can be used) for the duration of the COVID-19 declaration under  Section 564(b)(1) of the Act, 21 U.S.C. section 360bbb-3(b)(1), unless the authorization is terminated or revoked.  Performed at [facility], [place] [address] [place], [place] [address]   Culture, blood (routine x 2)     Status: Abnormal   Collection Time: 03/26/20  6:33 PM   Specimen: BLOOD  Result Value Ref Range Status   Specimen Description   Final    BLOOD LEFT HAND Performed at [facility] [address] [place], [place] [address]    Special Requests   Final    BOTTLES DRAWN AEROBIC ONLY Blood Culture adequate volume Performed at [facility] [address], [place], [place] [address]    Culture  Setup Time   Final    GRAM POSITIVE COCCI AEROBIC BOTTLE  ONLY CRITICAL RESULT CALLED TO, READ BACK BY AND VERIFIED WITH: PHARMD E JACKSON 03/27/20 AT 2213 SK    Culture (A)  Final    ROTHIA MUCILAGINOSA Standardized susceptibility testing for this organism is not available. Performed at [facility], [place] [address] [place], [place] [address]    Report Status 03/29/2020 FINAL  Final  Culture, blood (routine x 2)     Status: None   Collection Time: 03/26/20  6:33 PM   Specimen: BLOOD  Result Value Ref Range Status   Specimen Description   Final    BLOOD LEFT ANTECUBITAL Performed at [facility] [address] [place], [place] [address]    Special Requests   Final    BOTTLES DRAWN AEROBIC ONLY Blood Culture adequate volume Performed at [facility] [address], [place], [place] [address]    Culture   Final    NO GROWTH 5 DAYS Performed at [facility], [place] [address], [place], [place] [address]    Report Status 03/31/2020 FINAL  Final  Blood Culture ID Panel (Reflexed)     Status: None   Collection Time: 03/26/20  6:33 PM  Result Value Ref Range Status   Enterococcus faecalis NOT DETECTED NOT DETECTED Final   Enterococcus Faecium NOT DETECTED NOT  DETECTED Final   Listeria monocytogenes NOT DETECTED NOT DETECTED Final   Staphylococcus species NOT DETECTED NOT DETECTED Final   Staphylococcus aureus (BCID) NOT DETECTED NOT DETECTED Final   Staphylococcus epidermidis NOT DETECTED NOT DETECTED Final   Staphylococcus lugdunensis NOT DETECTED NOT DETECTED Final   Streptococcus species NOT DETECTED NOT DETECTED Final   Streptococcus agalactiae NOT DETECTED NOT DETECTED Final   Streptococcus pneumoniae NOT DETECTED NOT DETECTED Final   Streptococcus pyogenes NOT DETECTED NOT DETECTED Final   A.calcoaceticus-baumannii NOT DETECTED NOT DETECTED Final   Bacteroides fragilis NOT DETECTED NOT DETECTED Final   Enterobacterales NOT DETECTED NOT DETECTED Final   Enterobacter cloacae complex NOT DETECTED NOT DETECTED Final   Escherichia coli NOT DETECTED NOT DETECTED Final   Klebsiella aerogenes NOT DETECTED NOT DETECTED Final   Klebsiella oxytoca NOT DETECTED NOT DETECTED Final   Klebsiella pneumoniae NOT DETECTED NOT DETECTED Final   Proteus species NOT DETECTED NOT DETECTED Final   Salmonella species NOT DETECTED NOT DETECTED Final   Serratia marcescens NOT DETECTED NOT DETECTED Final   Haemophilus influenzae NOT DETECTED NOT DETECTED Final   Neisseria meningitidis NOT DETECTED NOT DETECTED Final   Pseudomonas aeruginosa NOT DETECTED NOT DETECTED Final   Stenotrophomonas maltophilia NOT DETECTED NOT DETECTED Final   Candida albicans NOT DETECTED NOT DETECTED Final   Candida auris NOT DETECTED NOT DETECTED Final   Candida glabrata NOT DETECTED NOT DETECTED Final   Candida krusei NOT DETECTED NOT DETECTED Final   Candida parapsilosis NOT DETECTED NOT DETECTED Final   Candida tropicalis NOT DETECTED NOT DETECTED Final   Cryptococcus neoformans/gattii NOT DETECTED NOT DETECTED Final    Comment: Performed at [facility], [address] [place], [place] [address]  Culture, blood (Routine X 2) w Reflex to ID Panel     Status: None  (Preliminary result)   Collection Time: 04/01/20 11:47 AM   Specimen: BLOOD  Result Value Ref Range Status   Specimen Description   Final    BLOOD LEFT ANTECUBITAL Performed at [facility], [place] [address], [place], [place] [address]    Special Requests   Final  BOTTLES DRAWN AEROBIC AND ANAEROBIC Blood Culture adequate volume Performed at [facility] [address], [place], [place] [address]    Culture   Final    NO GROWTH < 24 HOURS Performed at [facility] [address], [place], [place] [address]    Report Status PENDING  Incomplete  Culture, blood (Routine X 2) w Reflex to ID Panel     Status: None (Preliminary result)   Collection Time: 04/01/20 11:57 AM   Specimen: BLOOD  Result Value Ref Range Status   Specimen Description   Final    BLOOD RIGHT ANTECUBITAL Performed at [facility], [place] [address], [place], [place] [address]    Special Requests   Final    BOTTLES DRAWN AEROBIC AND ANAEROBIC Blood Culture adequate volume Performed at [facility] [address] [place], [place] [address]    Culture   Final    NO GROWTH < 24 HOURS Performed at [facility] [address], [place], [place] [address]    Report Status PENDING  Incomplete         Radiology Studies: No results found.      Scheduled Meds: . (feeding supplement) PROSource Plus  30 mL Oral BID WC  . alum & mag hydroxide-simeth  30 mL Oral Once   And  . lidocaine  15 mL Oral Once  . calcium-vitamin D  2 tablet Per Tube TID with meals  . Chlorhexidine Gluconate Cloth  6 each Topical Daily  . feeding supplement  1 Container Oral TID BM  . heparin  5,000 Units Subcutaneous Q8H  . insulin aspart  0-5 Units Subcutaneous QHS  . insulin aspart  0-9 Units Subcutaneous TID WC  . insulin glargine  10 Units Subcutaneous Daily  . latanoprost  1 drop Both Eyes QHS  . multivitamin  15 mL Oral Daily  . pantoprazole (PROTONIX) IV  40  mg Intravenous Q12H  . polyvinyl alcohol  1 drop Both Eyes TID  . silodosin  4 mg Per Tube Daily  . sucralfate  1 g Oral TID WC & HS   Continuous Infusions: . feeding supplement (OSMOLITE 1.5 CAL) 1,000 mL (04/03/20 0103)          Aline August, MD [facility] 04/03/2020, 7:45 AM

## 2020-04-03 NOTE — Progress Notes (Signed)
[facility] Gastroenterology Progress Note  John Walton 87 y.o. 1932/05/04  CC:  Dysphagia  Subjective: Patient denies any complaints. Is currently on ice chips, awaiting modified barium swallow.  ROS : Review of Systems  Cardiovascular: Negative for chest pain and palpitations.  Gastrointestinal: Negative for abdominal pain, blood in stool, constipation, diarrhea, heartburn, melena, nausea and vomiting.   Objective: Vital signs in last 24 hours: Vitals:   04/02/20 2147 04/03/20 0500  BP: (!) 132/55 (!) 137/58  Pulse: 97 92  Resp: 17 18  Temp:  97.8 F (36.6 C)  SpO2: 96% 95%    Physical Exam:  General:  Lethargic, thin, chronically ill-appearing  Head:  Normocephalic, without obvious abnormality, atraumatic  Eyes:  Anicteric sclera, EOMs intact  Lungs:   Respirations unlabored  Heart:  Regular rate and rhythm  Abdomen:   Soft, non-tender, non-distended; gastrostomy tube in place   Extremities: Extremities normal, atraumatic, no  edema  Pulses: 2+ and symmetric    Lab Results: Recent Labs    04/01/20 0354 04/02/20 0511 04/03/20 0551  NA 139 137 136  K 4.3 5.1 4.9  CL 109 105 102  CO2 22 24 25   GLUCOSE 213* 269* 312*  BUN 21 30* 36*  CREATININE 1.62* 1.59* 1.60*  CALCIUM 7.1* 7.3* 7.9*  MG 2.0 2.0 1.9  PHOS 2.8 1.9*  --    Recent Labs    04/01/20 0354 04/02/20 0511  ALBUMIN 2.2* 2.2*   Recent Labs    04/02/20 0511 04/03/20 0551  WBC 6.6 6.8  NEUTROABS 4.6 4.3  HGB 7.6* 7.3*  HCT 24.8* 23.9*  MCV 92.5 93.0  PLT 210 185   No results for input(s): LABPROT, INR in the last 72 hours.   Assessment: Dysphagia: likely multifactorial.  Osteophyte with esophageal compression, likely poor esophageal motility in the setting of debility, and esophagitis. -EGD 03/17/20: mild esophagitis, gastritis, duodenitis (bx: negative for H pylori)  Normocytic anemia: Hgb trending down, now 7.3, though no signs of GI blood loss at this time. Low iron and saturation,  as well as low TIBC. Normal ferritin.   Poor oral intake, debility  CKD   Plan: Continue feeds via PEG tube. Continue Protonix BID.    Await speech swallow study.  Diet as per speech recommendations.   Continue to monitor H&H with transfusion as needed to maintain Hgb >7.  [facility] will follow at a distance.  Salley Slaughter PA-C 04/03/2020, 9:37 AM  Contact #  [phone]

## 2020-04-03 NOTE — TOC Progression Note (Signed)
Transition of Care [facility]) - Progression Note    Patient Details  Name: John Walton MRN: [id number] Date of Birth: 1932/12/19  Transition of [facility]) CM/SW Contact  Lennart Pall, LCSW Phone Number: 04/03/2020, 1:22 PM  Clinical Narrative:    Alerted by MD that pt is medically ready for dc today.  Reached out to [facility]  SNF and they are unable to readmit until tomorrow in order to ensure they have needed tube feeding supplies ready.  We are also still awaiting COVID test results.  Have alerted MD, RN, pt and wife of plan to transfer back tomorrow.   Expected Discharge Plan: [place] Barriers to Discharge: Continued Medical Work up  Expected Discharge Plan and Services Expected Discharge Plan: [place] Acute Care Choice: Resumption of Svcs/PTA Provider Living arrangements for the past 2 months: Single Family Home Expected Discharge Date: 04/03/20                                     Social Determinants of Health (SDOH) Interventions    Readmission Risk Interventions No flowsheet data found.

## 2020-04-04 ENCOUNTER — Other Ambulatory Visit: Payer: Self-pay | Admitting: *Deleted

## 2020-04-04 LAB — CBC WITH DIFFERENTIAL/PLATELET
Abs Immature Granulocytes: 0.64 10*3/uL — ABNORMAL HIGH (ref 0.00–0.07)
Basophils Absolute: 0.1 10*3/uL (ref 0.0–0.1)
Basophils Relative: 1 %
Eosinophils Absolute: 0.3 10*3/uL (ref 0.0–0.5)
Eosinophils Relative: 4 %
HCT: 26.9 % — ABNORMAL LOW (ref 39.0–52.0)
Hemoglobin: 8.2 g/dL — ABNORMAL LOW (ref 13.0–17.0)
Immature Granulocytes: 10 %
Lymphocytes Relative: 9 %
Lymphs Abs: 0.6 10*3/uL — ABNORMAL LOW (ref 0.7–4.0)
MCH: 28.9 pg (ref 26.0–34.0)
MCHC: 30.5 g/dL (ref 30.0–36.0)
MCV: 94.7 fL (ref 80.0–100.0)
Monocytes Absolute: 1 10*3/uL (ref 0.1–1.0)
Monocytes Relative: 15 %
Neutro Abs: 4 10*3/uL (ref 1.7–7.7)
Neutrophils Relative %: 61 %
Platelets: 245 10*3/uL (ref 150–400)
RBC: 2.84 MIL/uL — ABNORMAL LOW (ref 4.22–5.81)
RDW: 13.9 % (ref 11.5–15.5)
WBC: 6.6 10*3/uL (ref 4.0–10.5)
nRBC: 0 % (ref 0.0–0.2)

## 2020-04-04 LAB — BASIC METABOLIC PANEL
Anion gap: 7 (ref 5–15)
BUN: 42 mg/dL — ABNORMAL HIGH (ref 8–23)
CO2: 29 mmol/L (ref 22–32)
Calcium: 8.3 mg/dL — ABNORMAL LOW (ref 8.9–10.3)
Chloride: 101 mmol/L (ref 98–111)
Creatinine, Ser: 1.67 mg/dL — ABNORMAL HIGH (ref 0.61–1.24)
GFR, Estimated: 40 mL/min — ABNORMAL LOW (ref >60)
Glucose, Bld: 178 mg/dL — ABNORMAL HIGH (ref 70–99)
Potassium: 5.6 mmol/L — ABNORMAL HIGH (ref 3.5–5.1)
Sodium: 137 mmol/L (ref 135–145)

## 2020-04-04 LAB — MAGNESIUM: Magnesium: 2 mg/dL (ref 1.7–2.4)

## 2020-04-04 NOTE — TOC Transition Note (Signed)
Transition of Care [facility]) - CM/SW Discharge Note   Patient Details  Name: John Walton MRN: [id number] Date of Birth: Aug 11, 1932  Transition [facility] Care [facility]) CM/SW Contact:  Lennart Pall, LCSW Phone Number: 04/04/2020, 10:31 AM   Clinical Narrative:    Pt medically ready for return today to [facility] and facility confirms they have all feeding supplies in place.  Pt and wife pleased.  PTAR called.  RN to call report to [phone].  No further TOC needs.   Final next level of care: [place] Barriers to Discharge: Barriers Resolved   Patient Goals and CMS Choice Patient states their goals for this hospitalization and ongoing recovery are:: return to [facility].gov Compare Post Acute Care list provided to:: Patient Choice offered to / list presented to : [facility]  Discharge Placement   Existing PASRR number confirmed : 04/27/20          Patient chooses bed at: [facility] Patient to be transferred to facility by: [place] Name of family member notified: wife, John Walton Patient and family notified of of transfer: 04/04/20  Discharge Plan and Services     Post Acute Care Choice: Resumption of Svcs/PTA Provider          DME Arranged: N/A DME Agency: NA                  Social Determinants of Health ([place]) Interventions     Readmission Risk Interventions No flowsheet data found.

## 2020-04-04 NOTE — Patient Outreach (Signed)
[facility]) Care Management  04/04/2020  MACAULEY MOSSBERG 1932-07-31 [id number]   Notified by hospital liaison that member discharged on 1/4 back to SNF.  Post Acute Care coordinator also notified.  This RNCM will not open case at this time, will anticipate new referral once member is ready for discharge from SNF.  Valente David, [place], MSN [place] [phone]

## 2020-04-04 NOTE — Plan of Care (Signed)
Patient seen. Stable for discharge to [place]. Discharge summary reviewed, no changes needed.   Dwyane Dee, MD [facility] 04/04/2020, 10:58 AM

## 2020-04-04 NOTE — Progress Notes (Signed)
[facility] Gastroenterology Progress Note  John Walton 87 y.o. 27-Oct-1932  CC:  Dysphagia  Subjective: Patient denies any abdominal pain or other complaints.  ROS : Review of Systems  Cardiovascular: Negative for chest pain and palpitations.  Gastrointestinal: Negative for abdominal pain, blood in stool, constipation, diarrhea, heartburn, melena, nausea and vomiting.  br Objective: Vital signs in last 24 hours: Vitals:   04/03/20 2127 04/04/20 0556  BP: (!) 140/53 (!) 161/63  Pulse: 91 89  Resp: 17 17  Temp: 97.9 F (36.6 C) 98 F (36.7 C)  SpO2: 98% 98%    Physical Exam:  General:  Lethargic, thin, chronically ill-appearing  Head:  Normocephalic, without obvious abnormality, atraumatic  Eyes:  Anicteric sclera, EOMs intact  Lungs:   Respirations unlabored  Heart:  Regular rate and rhythm  Abdomen:   Soft, non-tender, non-distended; gastrostomy tube in place   Extremities: Extremities normal, atraumatic, no  edema  Pulses: 2+ and symmetric    Lab Results: Recent Labs    04/02/20 0511 04/03/20 0551 04/04/20 0444  NA 137 136 137  K 5.1 4.9 5.6*  CL 105 102 101  CO2 24 25 29   GLUCOSE 269* 312* 178*  BUN 30* 36* 42*  CREATININE 1.59* 1.60* 1.67*  CALCIUM 7.3* 7.9* 8.3*  MG 2.0 1.9 2.0  PHOS 1.9*  --   --    Recent Labs    04/02/20 0511  ALBUMIN 2.2*   Recent Labs    04/03/20 0551 04/04/20 0444  WBC 6.8 6.6  NEUTROABS 4.3 4.0  HGB 7.3* 8.2*  HCT 23.9* 26.9*  MCV 93.0 94.7  PLT 185 245   No results for input(s): LABPROT, INR in the last 72 hours.   Assessment: Dysphagia: multifactorial.  Modified barium swallow yesterday revealed moderate pharyngeal-cervical esophageal dysphagia obstructive in nature due to DISH but also with impact from deconditioning. Patient had recent EGD 03/17/20: mild esophagitis, gastritis, duodenitis (bx: negative for H pylori)  Normocytic anemia: Hgb improved to 8.2.  No signs of GI blood loss. Normal ferritin.   Poor  oral intake, debility  CKD   Plan: Continue feeds via PEG tube. Continue Protonix BID.  No further recommendations from a GI standpoint.  [facility] will sign off.  Please contact us if we can be of any further assistance during this hospital stay.  Salley Slaughter PA-C 04/04/2020, 9:13 AM  Contact #  [phone]

## 2020-04-04 NOTE — Progress Notes (Signed)
Pt d/cd to [place]. Attempted to call report but had to leave a message on the charge nurse phone.

## 2020-04-05 LAB — GLUCOSE, CAPILLARY
Glucose-Capillary: 212 mg/dL — ABNORMAL HIGH (ref 70–99)
Glucose-Capillary: 232 mg/dL — ABNORMAL HIGH (ref 70–99)
Glucose-Capillary: 215 mg/dL — ABNORMAL HIGH (ref 70–99)
Glucose-Capillary: 233 mg/dL — ABNORMAL HIGH (ref 70–99)
Glucose-Capillary: 293 mg/dL — ABNORMAL HIGH (ref 70–99)
Glucose-Capillary: 363 mg/dL — ABNORMAL HIGH (ref 70–99)
Glucose-Capillary: 309 mg/dL — ABNORMAL HIGH (ref 70–99)
Glucose-Capillary: 246 mg/dL — ABNORMAL HIGH (ref 70–99)
Glucose-Capillary: 312 mg/dL — ABNORMAL HIGH (ref 70–99)
Glucose-Capillary: 138 mg/dL — ABNORMAL HIGH (ref 70–99)

## 2020-04-06 LAB — CULTURE, BLOOD (ROUTINE X 2)
Culture: NO GROWTH
Culture: NO GROWTH
Special Requests: ADEQUATE
Special Requests: ADEQUATE

## 2020-04-10 ENCOUNTER — Other Ambulatory Visit: Payer: Self-pay | Admitting: *Deleted

## 2020-04-10 NOTE — Patient Outreach (Signed)
Member screened for potential [facility] Care Management needs.  Verified with [facility] SW that Mr. Teuscher is receiving skilled therapy in SNF.  Per [facility] SW, transition plan is to return home with spouse. Care plan meeting to be scheduled this week to discuss further.   Will continue to follow while member resides in SNF. Will plan outreach to Mr. Nin or wife, as appropriate to discuss [place] Management services.    Marthenia Rolling, MSN, RN,BSN [place] Acute Care Coordinator [phone] [facility]) [phone]  (Toll free office)

## 2020-04-12 ENCOUNTER — Ambulatory Visit: Payer: Medicare Other | Admitting: Podiatry

## 2020-04-18 ENCOUNTER — Other Ambulatory Visit: Payer: Self-pay | Admitting: *Deleted

## 2020-04-18 NOTE — Patient Outreach (Signed)
THN Post- Acute Care Coordinator follow up. Member screened for potential [facility] Care Management needs.  Mr. Netto is receiving skilled therapy at [facility].   Update received from [facility] SNF SW indicating member's transition plan continues to be home with spouse. Member is receiving wound care on his heel. Facility MD and IDT team are addressing Mr. Delo's fluctuating blood pressure.   Will continue to follow for transition plans and [facility] Care Management needs. Will continue to collaborate with facility SW while member resides in SNF. Will plan outreach to spouse as appropriate.    Marthenia Rolling, MSN, RN,BSN [place] Acute Care Coordinator [phone] [facility]) [phone]  (Toll free office)

## 2020-04-19 ENCOUNTER — Other Ambulatory Visit: Payer: Self-pay

## 2020-04-19 ENCOUNTER — Non-Acute Institutional Stay: Payer: Medicare Other | Admitting: Hospice

## 2020-04-19 DIAGNOSIS — S72001D Fracture of unspecified part of neck of right femur, subsequent encounter for closed fracture with routine healing: Secondary | ICD-10-CM

## 2020-04-19 DIAGNOSIS — Z515 Encounter for palliative care: Secondary | ICD-10-CM

## 2020-04-19 NOTE — Progress Notes (Signed)
PATIENT NAME: John Walton [address] [id number] [phone] (home)  DOB: 20-Sep-1932 MRN: [id number]  PRIMARY CARE PROVIDER:    [name], MD,  [place] [address] [phone]  REFERRING PROVIDER:   Dr. Merilynn Finland   RESPONSIBLE PARTY:   Teodoro Kil  Extended Emergency Contact Information Primary Emergency Contact: Dionne,[name] Address: [address], [place] [address] [place] Phone: [phone] Mobile Phone: [phone] Relation: Spouse  I met face to face with patient and family in home/facility.  ADVANCE CARE PLANNING/RECOMMENDATIONS/PLAN:    Visit at the request of Dr. Merilynn Finland   for palliative consult. Visit consisted of building trust and discussions on Palliative Medicine as specialized medical care for people living with serious illness, aimed at facilitating better quality of life through symptoms relief, assisting with advance care plan and establishing complex decision making.  Spouse was present at bedside during visit.  Patient/spouse endorsed palliative service and would like patient followed when discharged home.  Discussion on the difference between Palliative and Hospice care. Palliative care and hospice have similar goals of managing symptoms, promoting comfort, improving quality of life, and maintaining a person's dignity. However, palliative care may be offered during any phase of a serious illness, while hospice care is usually offered when a person is expected to live for 6 months or less.  Patient/family is interested in hospice service in the future. Palliative care team will continue to support patient, patient's family, and medical team.  Advance Care Planning: Our advance care planning conversation included a discussion about:    The value and importance of advance care planning  Exploration of goals of care in the event of a sudden injury or  illness  Identification and preparation of a healthcare agent          Review and updating or creation of an advance directive document       CODE STATUS: Patient is a full code  GOALS OF CARE: Goals of care include to maximize quality of life and symptom management.  Follow up Palliative Care Visit: Palliative care will continue to follow for goals of care clarification and symptom management.  Follow up in a month/as needed  Symptom Management: Patient with right hip fracture from a fall; in SNF for acute rehab. Dysphagia: Patient is peg tube dependent. ST is following patient.  Weakness: PT/OT is ongoing for ambulation, strengthening; progress has been slow.  Pain: patient denies pain. Nursing reports he has Norco which he has not used in over one week. Palliative will continue to monitor for symptom management/decline and make recommendations as needed.   Family /Caregiver/Community Supports: Patient in SNF for rehab. Patient was living with spouse at home and hopes to go back home after discharge. 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 Patient is a Scientist, research (life sciences)  I spent one hour and 16  minutes providing this initial consultation; time includes time spent with patient/family, chart review, provider coordination,  and documentation. More than 50% of the time in this consultation was spent on counseling patient and coordinating communication.   CHIEF COMPLAIN/HISTORY OF PRESENT ILLNESS:  John Walton is a 87 y.o. male with multiple medical problems including recent hip fracture following a fall, HTN, Type 2 DM CKD. History obtained from review of EMR, spouse, primary RN and discussion with patient.   Palliative Care was asked to follow this patient by consultation  request of Tisovec, Fransico Him, MD to help address advance care planning and complex decision making. Thank you for the opportunity to participate in the care of [name]: Full code  PPS:  40%  HOSPICE ELIGIBILITY/DIAGNOSIS: TBD  PAST MEDICAL HISTORY:  Past Medical History:  Diagnosis Date  . Chicken pox   . Frequent headaches   . Glaucoma   . Hay fever   . History of blood transfusion   . HTN (hypertension)   . Hyperlipidemia   . Hyperlipidemia   . Migraines   . 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[facility])     SOCIAL HX:  Social History   Tobacco Use  . Smoking status: Former Smoker    Start date: 04/01/1968    Quit date: 01/11/1977    Years since quitting: 43.2  . Smokeless tobacco: Never Used  Substance Use Topics  . Alcohol use: No   FAMILY HX:  Family History  Problem Relation Age of Onset  . Liver disease Mother   . Skin cancer Father   . Skin cancer Paternal Grandfather   . Diabetes Maternal Grandfather      ALLERGIES:  Allergies  Allergen Reactions  . Robitussin Cold Cough+ Chest [Dextromethorphan-Guaifenesin] Swelling    Laryngeal edema - any cough syrup     PERTINENT MEDICATIONS:  Outpatient Encounter Medications as of 04/19/2020  Medication Sig  . atorvastatin (LIPITOR) 40 MG tablet Place 1 tablet (40 mg total) into feeding tube daily.  . calcium-vitamin D (OSCAL WITH D) 500-200 MG-UNIT tablet Place 1 tablet into feeding tube with breakfast, with lunch, and with evening meal.  . carboxymethylcellulose (REFRESH PLUS) 0.5 % SOLN Place 1 drop into both eyes in the morning, at noon, and at bedtime.  . ferrous sulfate 300 (60 Fe) MG/5ML syrup Place 5 mLs (300 mg total) into feeding tube daily.  . insulin glargine (LANTUS) 100 UNIT/ML Solostar Pen Inject 15 Units into the skin daily.  . Insulin Pen Needle 29G X 12.7MM MISC Use as directed  . latanoprost (XALATAN) 0.005 % ophthalmic solution Place 1 drop into both eyes at bedtime.   . Nutritional Supplements (,FEEDING SUPPLEMENT, PROSOURCE PLUS) liquid Take 45 mLs by mouth daily. Via tube  . Nutritional Supplements (FEEDING SUPPLEMENT, OSMOLITE 1.5 CAL,) LIQD Place 237 mLs into feeding tube 5 (five)  times daily.  Vladimir Faster Glycol-Propyl Glycol (SYSTANE) 0.4-0.3 % GEL ophthalmic gel Place 1 application into both eyes in the morning and at bedtime.  . silodosin (RAPAFLO) 4 MG CAPS capsule Place 1 capsule (4 mg total) into feeding tube daily.  Marland Kitchen SIMBRINZA 1-0.2 % SUSP Place 1 drop into both eyes in the morning and at bedtime.   . sucralfate (CARAFATE) 1 GM/10ML suspension Take 10 mLs (1 g total) by mouth 4 (four) times daily -  with meals and at bedtime.  . Water For Irrigation, Sterile (FREE WATER) SOLN Place 120 mLs into feeding tube 5 (five) times daily.   No facility-administered encounter medications on file as of 04/19/2020.    PHYSICAL EXAM/ROS  General: NAD, cooperative Cardiovascular: regular rate and rhythm; denies chest pain Pulmonary: clear ant /post fields Abdomen: soft, nontender, + bowel sounds GU: no suprapubic tenderness; Foley cath in place, patent.  Extremities: no edema, no joint deformities Skin: no rashes to visible skin Neurological: Weakness but otherwise nonfocal  Note: Portions of this note were generated with Dragon dictation software. Dictation errors may occur despite best attempts at proofreading.  Windy Canny I  Jamauri Kruzel, NP

## 2020-05-01 ENCOUNTER — Other Ambulatory Visit: Payer: Self-pay | Admitting: *Deleted

## 2020-05-01 NOTE — Patient Outreach (Signed)
[place] Coordinator follow up. Member screened for potential [facility] Care Management needs.  Communication sent to [facility] SNF SW to request update on transition plan.  Will continue to follow while member resides in SNF,   Marthenia Rolling, MSN, RN,BSN [employer] [phone] [facility] [phone]  (Toll free office)

## 2020-05-02 ENCOUNTER — Other Ambulatory Visit: Payer: Self-pay | Admitting: *Deleted

## 2020-05-02 NOTE — Patient Outreach (Signed)
THN Post- Acute Care Coordinator follow up. Member screened for potential [facility] Care Management needs.  Mr. Bruemmer remains in [facility] SNF. Facility SW reports member is receiving skilled nursing care. No definitive transition date yet. Will update closer to dc.   Will continue to follow while member resides in SNF.    Marthenia Rolling, MSN, RN,BSN [place] Acute Care Coordinator [phone] [facility]) [phone]  (Toll free office)

## 2020-05-19 ENCOUNTER — Other Ambulatory Visit: Payer: Self-pay | Admitting: *Deleted

## 2020-05-19 NOTE — Patient Outreach (Signed)
[place] Coordinator follow up. Member screened for potential [facility] Care Management needs.  Cross checked February All Payor's list for eligible [facility] members. Mr. Heacock is no longer listed.   Therefore, Probation officer will no longer follow for potential [facility] services.    Marthenia Rolling, MSN, RN,BSN [place] Acute Care Coordinator [phone] [facility]) [phone]  (Toll free office)

## 2020-07-06 ENCOUNTER — Encounter (HOSPITAL_BASED_OUTPATIENT_CLINIC_OR_DEPARTMENT_OTHER): Payer: Self-pay | Admitting: *Deleted

## 2020-07-06 ENCOUNTER — Emergency Department (HOSPITAL_BASED_OUTPATIENT_CLINIC_OR_DEPARTMENT_OTHER)
Admission: EM | Admit: 2020-07-06 | Discharge: 2020-07-06 | Disposition: A | Discharge status: Home / Self Care | Payer: No Typology Code available for payment source | Attending: Emergency Medicine | Admitting: Emergency Medicine

## 2020-07-06 DIAGNOSIS — Z794 Long term (current) use of insulin: Secondary | ICD-10-CM | POA: Diagnosis not present

## 2020-07-06 DIAGNOSIS — E119 Type 2 diabetes mellitus without complications: Secondary | ICD-10-CM | POA: Diagnosis not present

## 2020-07-06 DIAGNOSIS — Z87891 Personal history of nicotine dependence: Secondary | ICD-10-CM | POA: Insufficient documentation

## 2020-07-06 DIAGNOSIS — N184 Chronic kidney disease, stage 4 (severe): Secondary | ICD-10-CM | POA: Insufficient documentation

## 2020-07-06 DIAGNOSIS — I129 Hypertensive chronic kidney disease with stage 1 through stage 4 chronic kidney disease, or unspecified chronic kidney disease: Secondary | ICD-10-CM | POA: Insufficient documentation

## 2020-07-06 DIAGNOSIS — T839XXA Unspecified complication of genitourinary prosthetic device, implant and graft, initial encounter: Secondary | ICD-10-CM

## 2020-07-06 DIAGNOSIS — T83098A Other mechanical complication of other indwelling urethral catheter, initial encounter: Secondary | ICD-10-CM | POA: Diagnosis not present

## 2020-07-06 DIAGNOSIS — R39198 Other difficulties with micturition: Secondary | ICD-10-CM | POA: Diagnosis present

## 2020-07-06 DIAGNOSIS — Z96642 Presence of left artificial hip joint: Secondary | ICD-10-CM | POA: Insufficient documentation

## 2020-07-06 NOTE — ED Triage Notes (Signed)
Arrived via EMS has urine drainage system placed yesterday, secondary to chronic kidney disease. Nml colored urine noted in drainage tubing at this time. Pt complains being unable to void, having urgency.

## 2020-07-06 NOTE — ED Notes (Addendum)
BLADDER SCAN DONE - 45-56mls noted.

## 2020-07-06 NOTE — ED Notes (Signed)
Bladder feels soft, non-distended. Penis is very sensitive, ED PA at bedside, urinary catheter was flushed with 52ml NS, return noted, with small bright red blood clot, catheter balloon let down, catheter was advanced, 84ml NS refilled into balloon, large amt of urine now flowing into drainage bag. STAT-LOCK applied to Rt medial thigh for securing urinary catheter tubing.

## 2020-07-06 NOTE — ED Provider Notes (Signed)
[place] EMERGENCY DEPARTMENT Provider Note   CSN: [id number] Arrival date & time: 07/06/20  2002     History Chief Complaint  Patient presents with  . Foley issues    John Walton is a 87 y.o. male brought in by EMS for evaluation of difficulty urinating.  Patient had a urinary catheter inserted yesterday.  He reports that when he woke up this morning, he drained about 900 cc.  He states that since then, and has not been draining.  He has felt the urge to void but states that nothing is coming out.  Denies any abdominal pain.  He states that his wife called EMS when he was continuing to have difficulty voiding, prompting ED visit.  He denies any associated abdominal pain, fevers.  The history is provided by the patient.       Past Medical History:  Diagnosis Date  . Chicken pox   . Frequent headaches   . Glaucoma   . Hay fever   . History of blood transfusion   . HTN (hypertension)   . Hyperlipidemia   . Hyperlipidemia   . Migraines   . 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[facility])     Patient Active Problem List   Diagnosis Date Noted  . Hypocalcemia   . Protein-calorie malnutrition, severe 03/29/2020  . Hypokalemia 03/29/2020  . Hypernatremia 03/29/2020  . DM (diabetes mellitus), type 2 ([place]) 03/29/2020  . Fever 03/29/2020  . Debility 03/29/2020  . Dysphagia 03/26/2020  . Bilateral hydronephrosis 03/08/2020  . Unilateral primary osteoarthritis, left hip   . Femoral neck fracture ([place]) 03/07/2020  . CKD (chronic kidney disease), stage IV ([place]) 03/07/2020  . Normocytic anemia 03/07/2020  . BPH (benign prostatic hyperplasia) 03/07/2020  . Abdominal mass 03/07/2020  . Scalp hematoma 03/07/2020  . Hypertensive urgency 03/07/2020  . Pain due to onychomycosis of toenails of both feet 08/13/2019  . RBBB 09/02/2014  . Screening, ischemic heart disease 07/11/2014  . Medicare annual wellness visit, subsequent 07/11/2014  . Prostate cancer screening  07/11/2014  . Essential hypertension 01/11/2014  . Hyperlipidemia 01/11/2014    Past Surgical History:  Procedure Laterality Date  . CHOLECYSTECTOMY, LAPAROSCOPIC    . IR GASTROSTOMY TUBE MOD SED  03/29/2020  . TONSILLECTOMY  1942  . TOTAL HIP ARTHROPLASTY Left 03/08/2020   Procedure: TOTAL HIP ARTHROPLASTY;  Surgeon: Marybelle Killings, MD;  Location: [facility] ORS;  Service: Orthopedics;  Laterality: Left;       Family History  Problem Relation Age of Onset  . Liver disease Mother   . Skin cancer Father   . Skin cancer Paternal Grandfather   . Diabetes Maternal Grandfather     Social History   Tobacco Use  . Smoking status: Former Smoker    Start date: 04/01/1968    Quit date: 01/11/1977    Years since quitting: 43.5  . Smokeless tobacco: Never Used  Vaping Use  . Vaping Use: Never used  Substance Use Topics  . Alcohol use: No  . Drug use: No    Home Medications Prior to Admission medications   Medication Sig Start Date End Date Taking? Authorizing Provider  atorvastatin (LIPITOR) 40 MG tablet Place 1 tablet (40 mg total) into feeding tube daily. 04/03/20   Aline August, MD  calcium-vitamin D (OSCAL WITH D) 500-200 MG-UNIT tablet Place 1 tablet into feeding tube with breakfast, with lunch, and with evening meal. 04/03/20 05/03/20  Aline August, MD  carboxymethylcellulose (REFRESH PLUS) 0.5 % SOLN Place  1 drop into both eyes in the morning, at noon, and at bedtime.    [provider]  ferrous sulfate 300 (60 Fe) MG/5ML syrup Place 5 mLs (300 mg total) into feeding tube daily. 04/03/20 05/03/20  Aline August, MD  insulin glargine (LANTUS) 100 UNIT/ML Solostar Pen Inject 15 Units into the skin daily. 04/03/20   Aline August, MD  Insulin Pen Needle 29G X 12.7MM MISC Use as directed 03/13/20   Allie Bossier, MD  latanoprost (XALATAN) 0.005 % ophthalmic solution Place 1 drop into both eyes at bedtime.  03/13/19   [provider]  Nutritional Supplements (FEEDING  SUPPLEMENT, OSMOLITE 1.5 CAL,) LIQD Place 237 mLs into feeding tube 5 (five) times daily. 04/03/20   Aline August, MD  Polyethyl Glycol-Propyl Glycol (SYSTANE) 0.4-0.3 % GEL ophthalmic gel Place 1 application into both eyes in the morning and at bedtime.    [provider]  silodosin (RAPAFLO) 4 MG CAPS capsule Place 1 capsule (4 mg total) into feeding tube daily. 04/04/20   Aline August, MD  SIMBRINZA 1-0.2 % SUSP Place 1 drop into both eyes in the morning and at bedtime.  05/16/19   [provider]  sucralfate (CARAFATE) 1 GM/10ML suspension Take 10 mLs (1 g total) by mouth 4 (four) times daily -  with meals and at bedtime. 04/03/20   Aline August, MD  Water For Irrigation, Sterile (FREE WATER) SOLN Place 120 mLs into feeding tube 5 (five) times daily. 04/03/20   Aline August, MD    Allergies    Robitussin cold cough+ chest [dextromethorphan-guaifenesin]  Review of Systems   Review of Systems  Constitutional: Negative for fever.  Gastrointestinal: Negative for abdominal pain, diarrhea, nausea and vomiting.  Genitourinary: Positive for difficulty urinating.  All other systems reviewed and are negative.   Physical Exam Updated Vital Signs BP (!) 159/66 (BP Location: Right Arm)   Pulse (!) 105   Temp 98.4 F (36.9 C) (Oral)   Resp 18   Ht 5\' 9"  (1.753 m)   Wt 69.9 kg   SpO2 100%   BMI 22.74 kg/m   Physical Exam Vitals and nursing note reviewed.  Constitutional:      Appearance: He is well-developed.  HENT:     Head: Normocephalic and atraumatic.  Eyes:     General: No scleral icterus.       Right eye: No discharge.        Left eye: No discharge.     Conjunctiva/sclera: Conjunctivae normal.  Pulmonary:     Effort: Pulmonary effort is normal.     Comments: Lungs clear to auscultation bilaterally.  Symmetric chest rise.  No wheezing, rales, rhonchi. Abdominal:     Comments: Abdomen is soft, non-distended, non-tender. No rigidity, No guarding. No peritoneal  signs.  Genitourinary:    Comments: The exam was performed with a chaperone present Legrand Como, RN).  Foley catheter in place.  No testicular pain, warmth, erythema. Skin:    General: Skin is warm and dry.  Neurological:     Mental Status: He is alert.  Psychiatric:        Speech: Speech normal.        Behavior: Behavior normal.     ED Results / Procedures / Treatments   Labs (all labs ordered are listed, but only abnormal results are displayed) Labs Reviewed - No data to display  EKG None  Radiology No results found.  Procedures Procedures   Medications Ordered in ED Medications - No  data to display  ED Course  I have reviewed the triage vital signs and the nursing notes.  Pertinent labs & imaging results that were available during my care of the patient were reviewed by me and considered in my medical decision making (see chart for details).    MDM Rules/Calculators/A&P                          87 year old male who presents for evaluation of difficulty urinating.  He has a catheter inserted that was done yesterday.  Reports this morning, he drained about 900 cc.  Since then, he has not been able to drain it.  Feels like he has the sensation to void but nothing is coming out.  No abdominal pain.  On initial arrival, he is afebrile, nontoxic-appearing.  Vital signs are stable.  Catheter appears in place.  We will plan to flush and reevaluate.  Catheter flushed at bedside which revealed a large blood clot.  After blood clot passed, it is draining yellow urine.  Catheter draining moderate amounts of urine.  Patient reports feeling much more comfortable.  Patient still with benign abdominal exam.  Discussed with him regarding follow-up with his urologist. At this time, patient exhibits no emergent life-threatening condition that require further evaluation in ED. Discussed patient with Dr. Joya Gaskins. Patient had ample opportunity for questions and discussion. All patient's questions  were answered with full understanding. Strict return precautions discussed. Patient expresses understanding and agreement to plan.   Portions of this note were generated with Lobbyist. Dictation errors may occur despite best attempts at proofreading.   Final Clinical Impression(s) / ED Diagnoses Final diagnoses:  Problem with Foley catheter, initial encounter [facility])    Rx / DC Orders ED Discharge Orders    None       Desma Mcgregor 07/06/20 2139    Arnaldo Natal, MD 07/06/20 2207

## 2020-07-06 NOTE — Discharge Instructions (Signed)
Follow up with your urology doctor.   Return to the Emergency Dept for any abdominal pain, vomiting, fevers, inability to urinate or any other worsening or concerning symptoms.

## 2020-07-06 NOTE — ED Notes (Signed)
Pt Primary Urologist is Dr. Franchot Gallo, MD with [facility] in [place].

## 2020-07-06 NOTE — ED Notes (Signed)
ED Provider at bedside. 

## 2020-07-06 NOTE — ED Notes (Signed)
AVS and follow up discussed with pt and wife, AVS provided to wife. Opportunity for questions provided. Pt assisted with getting dressed and placed in wheelchair, assisted to POV

## 2020-07-06 NOTE — ED Notes (Signed)
Arrived by [facility], states he cannot pee, urinary catheter in place and urine noted in drainage bag of light straw, clear colored urine

## 2020-07-12 ENCOUNTER — Emergency Department (HOSPITAL_BASED_OUTPATIENT_CLINIC_OR_DEPARTMENT_OTHER)
Admission: EM | Admit: 2020-07-12 | Discharge: 2020-07-12 | Disposition: A | Discharge status: Home / Self Care | Payer: No Typology Code available for payment source | Attending: Emergency Medicine | Admitting: Emergency Medicine

## 2020-07-12 DIAGNOSIS — Y846 Urinary catheterization as the cause of abnormal reaction of the patient, or of later complication, without mention of misadventure at the time of the procedure: Secondary | ICD-10-CM | POA: Insufficient documentation

## 2020-07-12 DIAGNOSIS — N184 Chronic kidney disease, stage 4 (severe): Secondary | ICD-10-CM | POA: Diagnosis not present

## 2020-07-12 DIAGNOSIS — Z794 Long term (current) use of insulin: Secondary | ICD-10-CM | POA: Insufficient documentation

## 2020-07-12 DIAGNOSIS — E1122 Type 2 diabetes mellitus with diabetic chronic kidney disease: Secondary | ICD-10-CM | POA: Insufficient documentation

## 2020-07-12 DIAGNOSIS — I129 Hypertensive chronic kidney disease with stage 1 through stage 4 chronic kidney disease, or unspecified chronic kidney disease: Secondary | ICD-10-CM | POA: Diagnosis not present

## 2020-07-12 DIAGNOSIS — Z87891 Personal history of nicotine dependence: Secondary | ICD-10-CM | POA: Diagnosis not present

## 2020-07-12 DIAGNOSIS — T83511A Infection and inflammatory reaction due to indwelling urethral catheter, initial encounter: Secondary | ICD-10-CM | POA: Diagnosis not present

## 2020-07-12 DIAGNOSIS — N390 Urinary tract infection, site not specified: Secondary | ICD-10-CM

## 2020-07-12 DIAGNOSIS — Z79899 Other long term (current) drug therapy: Secondary | ICD-10-CM | POA: Diagnosis not present

## 2020-07-12 DIAGNOSIS — T839XXA Unspecified complication of genitourinary prosthetic device, implant and graft, initial encounter: Secondary | ICD-10-CM

## 2020-07-12 DIAGNOSIS — Z96642 Presence of left artificial hip joint: Secondary | ICD-10-CM | POA: Diagnosis not present

## 2020-07-12 LAB — URINALYSIS, ROUTINE W REFLEX MICROSCOPIC
Bilirubin Urine: NEGATIVE
Glucose, UA: NEGATIVE mg/dL
Ketones, ur: NEGATIVE mg/dL
Nitrite: NEGATIVE
Protein, ur: 100 mg/dL — AB
Specific Gravity, Urine: 1.015 (ref 1.005–1.030)
pH: 6 (ref 5.0–8.0)

## 2020-07-12 LAB — URINALYSIS, MICROSCOPIC (REFLEX): WBC, UA: >50 WBC/hpf (ref 0–5)

## 2020-07-12 MED ORDER — CEPHALEXIN 250 MG PO CAPS
500.0000 mg | ORAL_CAPSULE | Freq: Once | ORAL | Status: AC
  Administered 2020-07-12: 500 mg via ORAL
  Filled 2020-07-12: qty 2

## 2020-07-12 MED ORDER — CEPHALEXIN 500 MG PO CAPS: 500.0000 mg | ORAL_CAPSULE | Freq: Three times a day (TID) | ORAL | 0 refills | Status: AC

## 2020-07-12 NOTE — ED Provider Notes (Signed)
[facility] Provider Note  CSN: [id number] Arrival date & time: 07/12/20 3748  Chief Complaint(s) Urinary Retention  HPI John Walton is a 87 y.o. male with a history of BPH complicated by urinary retention requiring Foley catheter placed last week here for dysfunction of a catheter and urinary retention for the past several hours.  Patient is endorsing associated suprapubic discomfort.  Patient was seen last week for obstructed catheter due to clot.  Reports that has been draining ever since up until tonight.  Denies any other physical complaints.  HPI  Past Medical History Past Medical History:  Diagnosis Date  . Chicken pox   . Frequent headaches   . Glaucoma   . Hay fever   . History of blood transfusion   . HTN (hypertension)   . Hyperlipidemia   . Hyperlipidemia   . Migraines   . 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[facility])    Patient Active Problem List   Diagnosis Date Noted  . Hypocalcemia   . Protein-calorie malnutrition, severe 03/29/2020  . Hypokalemia 03/29/2020  . Hypernatremia 03/29/2020  . DM (diabetes mellitus), type 2 ([place]) 03/29/2020  . Fever 03/29/2020  . Debility 03/29/2020  . Dysphagia 03/26/2020  . Bilateral hydronephrosis 03/08/2020  . Unilateral primary osteoarthritis, left hip   . Femoral neck fracture ([place]) 03/07/2020  . CKD (chronic kidney disease), stage IV ([place]) 03/07/2020  . Normocytic anemia 03/07/2020  . BPH (benign prostatic hyperplasia) 03/07/2020  . Abdominal mass 03/07/2020  . Scalp hematoma 03/07/2020  . Hypertensive urgency 03/07/2020  . Pain due to onychomycosis of toenails of both feet 08/13/2019  . RBBB 09/02/2014  . Screening, ischemic heart disease 07/11/2014  . Medicare annual wellness visit, subsequent 07/11/2014  . Prostate cancer screening 07/11/2014  . Essential hypertension 01/11/2014  . Hyperlipidemia 01/11/2014   Home Medication(s) Prior to Admission medications   Medication Sig  Start Date End Date Taking? Authorizing Provider  cephALEXin (KEFLEX) 500 MG capsule Take 1 capsule (500 mg total) by mouth 3 (three) times daily for 10 days. 07/12/20 07/22/20 Yes Gerlean Cid, Grayce Sessions, MD  atorvastatin (LIPITOR) 40 MG tablet Place 1 tablet (40 mg total) into feeding tube daily. 04/03/20   Aline August, MD  calcium-vitamin D (OSCAL WITH D) 500-200 MG-UNIT tablet Place 1 tablet into feeding tube with breakfast, with lunch, and with evening meal. 04/03/20 05/03/20  Aline August, MD  carboxymethylcellulose (REFRESH PLUS) 0.5 % SOLN Place 1 drop into both eyes in the morning, at noon, and at bedtime.    [provider]  ferrous sulfate 300 (60 Fe) MG/5ML syrup Place 5 mLs (300 mg total) into feeding tube daily. 04/03/20 05/03/20  Aline August, MD  insulin glargine (LANTUS) 100 UNIT/ML Solostar Pen Inject 15 Units into the skin daily. 04/03/20   Aline August, MD  Insulin Pen Needle 29G X 12.7MM MISC Use as directed 03/13/20   Allie Bossier, MD  latanoprost (XALATAN) 0.005 % ophthalmic solution Place 1 drop into both eyes at bedtime.  03/13/19   [provider]  Nutritional Supplements (FEEDING SUPPLEMENT, OSMOLITE 1.5 CAL,) LIQD Place 237 mLs into feeding tube 5 (five) times daily. 04/03/20   Aline August, MD  Polyethyl Glycol-Propyl Glycol (SYSTANE) 0.4-0.3 % GEL ophthalmic gel Place 1 application into both eyes in the morning and at bedtime.    [provider]  silodosin (RAPAFLO) 4 MG CAPS capsule Place 1 capsule (4 mg total) into feeding tube daily. 04/04/20   Aline August, MD  SIMBRINZA 1-0.2 % SUSP Place 1 drop into both eyes in the morning and at bedtime.  05/16/19   [provider]  sucralfate (CARAFATE) 1 GM/10ML suspension Take 10 mLs (1 g total) by mouth 4 (four) times daily -  with meals and at bedtime. 04/03/20   Aline August, MD  Water For Irrigation, Sterile (FREE WATER) SOLN Place 120 mLs into feeding tube 5 (five) times daily. 04/03/20    Aline August, MD                                                                                                                                    Past Surgical History Past Surgical History:  Procedure Laterality Date  . CHOLECYSTECTOMY, LAPAROSCOPIC    . IR GASTROSTOMY TUBE MOD SED  03/29/2020  . TONSILLECTOMY  1942  . TOTAL HIP ARTHROPLASTY Left 03/08/2020   Procedure: TOTAL HIP ARTHROPLASTY;  Surgeon: Marybelle Killings, MD;  Location: [facility];  Service: Orthopedics;  Laterality: Left;   Family History Family History  Problem Relation Age of Onset  . Liver disease Mother   . Skin cancer Father   . Skin cancer Paternal Grandfather   . Diabetes Maternal Grandfather     Social History Social History   Tobacco Use  . Smoking status: Former Smoker    Start date: 04/01/1968    Quit date: 01/11/1977    Years since quitting: 43.5  . Smokeless tobacco: Never Used  Vaping Use  . Vaping Use: Never used  Substance Use Topics  . Alcohol use: No  . Drug use: No   Allergies Robitussin cold cough+ chest [dextromethorphan-guaifenesin]  Review of Systems Review of Systems All other systems are reviewed and are negative for acute change except as noted in the HPI  Physical Exam Vital Signs  I have reviewed the triage vital signs BP (!) 145/67 (BP Location: Right Arm)   Pulse (!) 114   Temp 97.8 F (36.6 C) (Oral)   Resp 17   Ht 5\' 9"  (1.753 m)   Wt 67.6 kg   SpO2 98%   BMI 22.00 kg/m   Physical Exam Vitals reviewed.  Constitutional:      General: He is not in acute distress.    Appearance: He is well-developed. He is not diaphoretic.  HENT:     Head: Normocephalic and atraumatic.     Jaw: No trismus.     Right Ear: External ear normal.     Left Ear: External ear normal.     Nose: Nose normal.  Eyes:     General: No scleral icterus.    Conjunctiva/sclera: Conjunctivae normal.  Neck:     Trachea: Phonation normal.  Cardiovascular:     Rate and Rhythm: Normal rate and  regular rhythm.  Pulmonary:     Effort: Pulmonary effort is normal. No respiratory distress.     Breath sounds: No stridor.  Abdominal:  General: There is no distension.     Tenderness: There is abdominal tenderness (mild) in the suprapubic area.  Genitourinary:    Comments: Foley inplace with 200cc in bag Musculoskeletal:        General: Normal range of motion.     Cervical back: Normal range of motion.  Neurological:     Mental Status: He is alert and oriented to person, place, and time.  Psychiatric:        Behavior: Behavior normal.     ED Results and Treatments Labs (all labs ordered are listed, but only abnormal results are displayed) Labs Reviewed  URINALYSIS, ROUTINE W REFLEX MICROSCOPIC - Abnormal; Notable for the following components:      Result Value   APPearance CLOUDY (*)    Hgb urine dipstick LARGE (*)    Protein, ur 100 (*)    Leukocytes,Ua MODERATE (*)    All other components within normal limits  URINALYSIS, MICROSCOPIC (REFLEX) - Abnormal; Notable for the following components:   Bacteria, UA MANY (*)    All other components within normal limits  URINE CULTURE                                                                                                                         EKG  EKG Interpretation  Date/Time:    Ventricular Rate:    PR Interval:    QRS Duration:   QT Interval:    QTC Calculation:   R Axis:     Text Interpretation:        Radiology No results found.  Pertinent labs & imaging results that were available during my care of the patient were reviewed by me and considered in my medical decision making (see chart for details).  Medications Ordered in ED Medications  cephALEXin (KEFLEX) capsule 500 mg (has no administration in time range)                                                                                                                                    Procedures Procedures  (including critical care  time)  Medical Decision Making / ED Course I have reviewed the nursing notes for this encounter and the patient's prior records (if available in EHR or on provided paperwork).   MEHMET SCALLY was evaluated in Emergency Department on 07/12/2020 for the symptoms described in the history of present illness. He  was evaluated in the context of the global COVID-19 pandemic, which necessitated consideration that the patient might be at risk for infection with the SARS-CoV-2 virus that causes COVID-19. Institutional protocols and algorithms that pertain to the evaluation of patients at risk for COVID-19 are in a state of rapid change based on information released by regulatory bodies including the CDC and federal and state organizations. These policies and algorithms were followed during the patient's care in the ED.  Catheter advanced and began to flow. Secured in new position. UA suspicious for infection Culture sent.  Will treat with Keflex.     Final Clinical Impression(s) / ED Diagnoses Final diagnoses:  Complication of Foley catheter, initial encounter ([place])  Urinary tract infection associated with indwelling urethral catheter, initial encounter [facility])   The patient appears reasonably screened and/or stabilized for discharge and I doubt any other medical condition or other [facility] requiring further screening, evaluation, or treatment in the ED at this time prior to discharge. Safe for discharge with strict return precautions.  Disposition: Discharge  Condition: Good  I have discussed the results, Dx and Tx plan with the patient/family who expressed understanding and agree(s) with the plan. Discharge instructions discussed at length. The patient/family was given strict return precautions who verbalized understanding of the instructions. No further questions at time of discharge.    ED Discharge Orders         Ordered    cephALEXin (KEFLEX) 500 MG capsule  3 times daily        07/12/20 0434             Follow Up: [name], MD [place] [address] [phone]  Call  as needed  Urology  Call  to schedule an appointment for close follow up      This chart was dictated using voice recognition software.  Despite best efforts to proofread,  errors can occur which can change the documentation meaning.   Fatima Blank, MD 07/12/20 [phone]

## 2020-07-12 NOTE — ED Notes (Signed)
Patients wife educated on foley care. Return demonstration completed.

## 2020-07-12 NOTE — ED Notes (Addendum)
Bladder soft/nondistended. Foley catheter noted to not be secured to leg PTA. Urinary catheter flushed with 20 ml normal saline return noted. Catheter balloon deflated with catheter advancement, 10 ml refilled into balloon. Large amount of urine noted after advancement. 400-500 ml of urine noted in foley bag. Stat-lock applied to left medial thigh for securement. MD aware.

## 2020-07-12 NOTE — ED Triage Notes (Signed)
Pt via ems from home. Reports urinary retention x7 hours. Foley in place. Pt also reports abd pain from being unable to void.

## 2020-07-12 NOTE — ED Notes (Signed)
Patient bladder scanned 228 ml noted on scanner.

## 2020-07-13 LAB — URINE CULTURE: Culture: >=100000 — AB

## 2020-07-16 ENCOUNTER — Encounter (HOSPITAL_BASED_OUTPATIENT_CLINIC_OR_DEPARTMENT_OTHER): Payer: Self-pay | Admitting: Emergency Medicine

## 2020-07-16 ENCOUNTER — Emergency Department (HOSPITAL_BASED_OUTPATIENT_CLINIC_OR_DEPARTMENT_OTHER)
Admission: EM | Admit: 2020-07-16 | Discharge: 2020-07-16 | Disposition: A | Discharge status: Home / Self Care | Payer: No Typology Code available for payment source | Attending: Emergency Medicine | Admitting: Emergency Medicine

## 2020-07-16 ENCOUNTER — Emergency Department (HOSPITAL_BASED_OUTPATIENT_CLINIC_OR_DEPARTMENT_OTHER): Payer: No Typology Code available for payment source

## 2020-07-16 ENCOUNTER — Other Ambulatory Visit: Payer: Self-pay

## 2020-07-16 DIAGNOSIS — N184 Chronic kidney disease, stage 4 (severe): Secondary | ICD-10-CM | POA: Insufficient documentation

## 2020-07-16 DIAGNOSIS — E1122 Type 2 diabetes mellitus with diabetic chronic kidney disease: Secondary | ICD-10-CM | POA: Diagnosis not present

## 2020-07-16 DIAGNOSIS — Z87891 Personal history of nicotine dependence: Secondary | ICD-10-CM | POA: Insufficient documentation

## 2020-07-16 DIAGNOSIS — R6889 Other general symptoms and signs: Secondary | ICD-10-CM | POA: Insufficient documentation

## 2020-07-16 DIAGNOSIS — R251 Tremor, unspecified: Secondary | ICD-10-CM | POA: Diagnosis present

## 2020-07-16 DIAGNOSIS — I129 Hypertensive chronic kidney disease with stage 1 through stage 4 chronic kidney disease, or unspecified chronic kidney disease: Secondary | ICD-10-CM | POA: Diagnosis not present

## 2020-07-16 DIAGNOSIS — Z96642 Presence of left artificial hip joint: Secondary | ICD-10-CM | POA: Diagnosis not present

## 2020-07-16 LAB — CBC WITH DIFFERENTIAL/PLATELET
Abs Immature Granulocytes: 0.13 10*3/uL — ABNORMAL HIGH (ref 0.00–0.07)
Basophils Absolute: 0 10*3/uL (ref 0.0–0.1)
Basophils Relative: 0 %
Eosinophils Absolute: 0.1 10*3/uL (ref 0.0–0.5)
Eosinophils Relative: 1 %
HCT: 29.4 % — ABNORMAL LOW (ref 39.0–52.0)
Hemoglobin: 9.1 g/dL — ABNORMAL LOW (ref 13.0–17.0)
Immature Granulocytes: 1 %
Lymphocytes Relative: 2 %
Lymphs Abs: 0.2 10*3/uL — ABNORMAL LOW (ref 0.7–4.0)
MCH: 27.2 pg (ref 26.0–34.0)
MCHC: 31 g/dL (ref 30.0–36.0)
MCV: 87.8 fL (ref 80.0–100.0)
Monocytes Absolute: 0.2 10*3/uL (ref 0.1–1.0)
Monocytes Relative: 1 %
Neutro Abs: 12.3 10*3/uL — ABNORMAL HIGH (ref 1.7–7.7)
Neutrophils Relative %: 95 %
Platelets: 360 10*3/uL (ref 150–400)
RBC: 3.35 MIL/uL — ABNORMAL LOW (ref 4.22–5.81)
RDW: 12.9 % (ref 11.5–15.5)
WBC: 12.9 10*3/uL — ABNORMAL HIGH (ref 4.0–10.5)
nRBC: 0 % (ref 0.0–0.2)

## 2020-07-16 LAB — COMPREHENSIVE METABOLIC PANEL
ALT: 9 U/L (ref 0–44)
AST: 15 U/L (ref 15–41)
Albumin: 2.9 g/dL — ABNORMAL LOW (ref 3.5–5.0)
Alkaline Phosphatase: 68 U/L (ref 38–126)
Anion gap: 11 (ref 5–15)
BUN: 30 mg/dL — ABNORMAL HIGH (ref 8–23)
CO2: 20 mmol/L — ABNORMAL LOW (ref 22–32)
Calcium: 8.3 mg/dL — ABNORMAL LOW (ref 8.9–10.3)
Chloride: 104 mmol/L (ref 98–111)
Creatinine, Ser: 1.75 mg/dL — ABNORMAL HIGH (ref 0.61–1.24)
GFR, Estimated: 38 mL/min — ABNORMAL LOW (ref >60)
Glucose, Bld: 119 mg/dL — ABNORMAL HIGH (ref 70–99)
Potassium: 4.1 mmol/L (ref 3.5–5.1)
Sodium: 135 mmol/L (ref 135–145)
Total Bilirubin: 0.3 mg/dL (ref 0.3–1.2)
Total Protein: 7.1 g/dL (ref 6.5–8.1)

## 2020-07-16 LAB — URINALYSIS, ROUTINE W REFLEX MICROSCOPIC
Bilirubin Urine: NEGATIVE
Glucose, UA: NEGATIVE mg/dL
Ketones, ur: NEGATIVE mg/dL
Nitrite: NEGATIVE
Protein, ur: 100 mg/dL — AB
Specific Gravity, Urine: 1.015 (ref 1.005–1.030)
pH: 5 (ref 5.0–8.0)

## 2020-07-16 LAB — URINALYSIS, MICROSCOPIC (REFLEX)

## 2020-07-16 LAB — LACTIC ACID, PLASMA: Lactic Acid, Venous: 1.9 mmol/L (ref 0.5–1.9)

## 2020-07-16 LAB — LIPASE, BLOOD: Lipase: 34 U/L (ref 11–51)

## 2020-07-16 MED ORDER — CIPROFLOXACIN HCL 500 MG PO TABS: 500.0000 mg | ORAL_TABLET | Freq: Two times a day (BID) | ORAL | 0 refills | Status: AC

## 2020-07-16 MED ORDER — SODIUM CHLORIDE 0.9 % IV BOLUS
500.0000 mL | Freq: Once | INTRAVENOUS | Status: AC
  Administered 2020-07-16: 500 mL via INTRAVENOUS

## 2020-07-16 MED ORDER — SODIUM CHLORIDE 0.9 % IV SOLN
INTRAVENOUS | Status: DC | PRN
  Administered 2020-07-16: 250 mL via INTRAVENOUS

## 2020-07-16 MED ORDER — SODIUM CHLORIDE 0.9 % IV SOLN
1.0000 g | Freq: Once | INTRAVENOUS | Status: AC
  Administered 2020-07-16: 1 g via INTRAVENOUS
  Filled 2020-07-16: qty 10

## 2020-07-16 NOTE — ED Triage Notes (Signed)
Pt to ED via [facility] with c/o episode of "shaking like a leaf" when he started to eat lunch today; denies pain.

## 2020-07-16 NOTE — Discharge Instructions (Signed)
You were seen in the emergency department today with shaking chills.  I am starting you on Cipro for the next 5 days for urine infection.  Please follow closely with your primary care doctor tomorrow.  If you develop fever, pain in your abdomen or back, trouble breathing, pain in your joints you should return to the emergency department immediately.  We are sending your blood and urine for culture and will call if you need additional antibiotics.

## 2020-07-16 NOTE — ED Provider Notes (Signed)
Emergency Department Provider Note   I have reviewed the triage vital signs and the nursing notes.   HISTORY  Chief Complaint Tremors   HPI John Walton is a 87 y.o. male with PMH reviewed below including urinary retention with indwelling foley catheter, HTN, and HLD who returns to the emergency department for evaluation of shaking happening earlier today.  Patient remained awake and alert during the episode but just was shaking all over.  He notes that this has resolved and denies any pain but did have some vomiting shortly afterwards.  He is not having headache.  There is no associated unilateral weakness or numbness.  No chest pain.  His wife is at bedside and states that during the episode he had some "panting" but no lasting SOB.  Wife notes that he is being treated with Keflex for recently identified urinary tract infection.  I looked at the urine culture results from this encounter and multiple species were identified and the recommendation was for recollection. No abdominal, back, or flank pain.   Past Medical History:  Diagnosis Date  . Chicken pox   . Frequent headaches   . Glaucoma   . Hay fever   . History of blood transfusion   . HTN (hypertension)   . Hyperlipidemia   . Hyperlipidemia   . Migraines   . 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[facility])     Patient Active Problem List   Diagnosis Date Noted  . Hypocalcemia   . Protein-calorie malnutrition, severe 03/29/2020  . Hypokalemia 03/29/2020  . Hypernatremia 03/29/2020  . DM (diabetes mellitus), type 2 ([place]) 03/29/2020  . Fever 03/29/2020  . Debility 03/29/2020  . Dysphagia 03/26/2020  . Bilateral hydronephrosis 03/08/2020  . Unilateral primary osteoarthritis, left hip   . Femoral neck fracture ([place]) 03/07/2020  . CKD (chronic kidney disease), stage IV ([place]) 03/07/2020  . Normocytic anemia 03/07/2020  . BPH (benign prostatic hyperplasia) 03/07/2020  . Abdominal mass 03/07/2020  . Scalp hematoma  03/07/2020  . Hypertensive urgency 03/07/2020  . Pain due to onychomycosis of toenails of both feet 08/13/2019  . RBBB 09/02/2014  . Screening, ischemic heart disease 07/11/2014  . Medicare annual wellness visit, subsequent 07/11/2014  . Prostate cancer screening 07/11/2014  . Essential hypertension 01/11/2014  . Hyperlipidemia 01/11/2014    Past Surgical History:  Procedure Laterality Date  . CHOLECYSTECTOMY, LAPAROSCOPIC    . IR GASTROSTOMY TUBE MOD SED  03/29/2020  . TONSILLECTOMY  1942  . TOTAL HIP ARTHROPLASTY Left 03/08/2020   Procedure: TOTAL HIP ARTHROPLASTY;  Surgeon: Marybelle Killings, MD;  Location: [facility];  Service: Orthopedics;  Laterality: Left;    Allergies Robitussin cold cough+ chest [dextromethorphan-guaifenesin]  Family History  Problem Relation Age of Onset  . Liver disease Mother   . Skin cancer Father   . Skin cancer Paternal Grandfather   . Diabetes Maternal Grandfather     Social History Social History   Tobacco Use  . Smoking status: Former Smoker    Start date: 04/01/1968    Quit date: 01/11/1977    Years since quitting: 43.5  . Smokeless tobacco: Never Used  Vaping Use  . Vaping Use: Never used  Substance Use Topics  . Alcohol use: No  . Drug use: No    Review of Systems  Constitutional: No fever/chills Eyes: No visual changes. ENT: No sore throat. Cardiovascular: Denies chest pain. Respiratory: Denies shortness of breath. Gastrointestinal: No abdominal pain.  No nausea, no vomiting.  No diarrhea.  No constipation. Genitourinary: Negative for dysuria. Musculoskeletal: Negative for back pain. Skin: Negative for rash. Neurological: Negative for headaches, focal weakness or numbness. Shaking episode at home.    10-point ROS otherwise negative.  ____________________________________________   PHYSICAL EXAM:  VITAL SIGNS: ED Triage Vitals  Enc Vitals Group     BP 07/16/20 1450 (!) 160/84     Pulse Rate 07/16/20 1450 (!) 131      Resp 07/16/20 1450 (!) 24     Temp 07/16/20 1450 98.6 F (37 C)     Temp Source 07/16/20 1450 Oral     SpO2 07/16/20 1450 97 %     Weight 07/16/20 1505 147 lb (66.7 kg)     Height 07/16/20 1505 5\' 9"  (1.753 m)   Constitutional: Alert and oriented. Well appearing and in no acute distress. Eyes: Conjunctivae are normal. PERRL. EOMI. Head: Atraumatic. Nose: No congestion/rhinnorhea. Mouth/Throat: Mucous membranes are moist.  Oropharynx non-erythematous. Neck: No stridor.   Cardiovascular: Normal rate, regular rhythm. Good peripheral circulation. Grossly normal heart sounds.   Respiratory: Normal respiratory effort.  No retractions. Lungs CTAB. Gastrointestinal: Soft and nontender. No distention. G tube noted in LUQ.  Musculoskeletal: No lower extremity tenderness nor edema. No gross deformities of extremities. Neurologic:  Normal speech and language. No gross focal neurologic deficits are appreciated.  Skin:  Skin is warm, dry and intact. No rash noted.  ____________________________________________   LABS (all labs ordered are listed, but only abnormal results are displayed)  Labs Reviewed  COMPREHENSIVE METABOLIC PANEL - Abnormal; Notable for the following components:      Result Value   CO2 20 (*)    Glucose, Bld 119 (*)    BUN 30 (*)    Creatinine, Ser 1.75 (*)    Calcium 8.3 (*)    Albumin 2.9 (*)    GFR, Estimated 38 (*)    All other components within normal limits  CBC WITH DIFFERENTIAL/PLATELET - Abnormal; Notable for the following components:   WBC 12.9 (*)    RBC 3.35 (*)    Hemoglobin 9.1 (*)    HCT 29.4 (*)    Neutro Abs 12.3 (*)    Lymphs Abs 0.2 (*)    Abs Immature Granulocytes 0.13 (*)    All other components within normal limits  URINALYSIS, ROUTINE W REFLEX MICROSCOPIC - Abnormal; Notable for the following components:   APPearance CLOUDY (*)    Hgb urine dipstick SMALL (*)    Protein, ur 100 (*)    Leukocytes,Ua MODERATE (*)    All other components  within normal limits  URINALYSIS, MICROSCOPIC (REFLEX) - Abnormal; Notable for the following components:   Bacteria, UA MANY (*)    All other components within normal limits  CULTURE, BLOOD (ROUTINE X 2)  CULTURE, BLOOD (ROUTINE X 2)  URINE CULTURE  LIPASE, BLOOD  LACTIC ACID, PLASMA   ____________________________________________  EKG   EKG Interpretation  Date/Time:  Sunday July 16 2020 16:23:44 EDT Ventricular Rate:  110 PR Interval:  170 QRS Duration: 137 QT Interval:  361 QTC Calculation: 489 R Axis:   268 Text Interpretation: Sinus tachycardia Right bundle branch block Similar to Mar 26 2020 tracing Confirmed by Nanda Quinton [phone]) on 07/16/2020 5:06:49 PM       ____________________________________________  RADIOLOGY  DG Chest Portable 1 View  Result Date: 07/16/2020 CLINICAL DATA:  Shaking uncontrollably EXAM: PORTABLE CHEST 1 VIEW COMPARISON:  03/26/2020 FINDINGS: Cardiac shadow is within normal limits. The lungs are well aerated bilaterally.  No focal infiltrate is seen. No bony abnormality is noted. IMPRESSION: No active disease. Electronically Signed   By: Inez Catalina M.D.   On: 07/16/2020 16:47    ____________________________________________   PROCEDURES  Procedure(s) performed:   Procedures  None  ____________________________________________   INITIAL IMPRESSION / ASSESSMENT AND PLAN / ED COURSE  Pertinent labs & imaging results that were available during my care of the patient were reviewed by me and considered in my medical decision making (see chart for details).   Patient presents to the emergency department shaking episode at home today.  He is currently being treated for urinary tract infection.  Given the description by the patient and family this seems consistent with rigors.  He has mild tachycardia here but is afebrile.  Mental status is within normal limits and at baseline for the patient.  Abdomen is diffusely soft nontender.  He does  have an indwelling Foley catheter which is draining well and not causing him pain.  He is currently on Keflex.  The history does not seem consistent with seizure or other neurologic cause of tremors.    UA here shows moderate leukocytes with many bacteria and 11-20 WBCs.  Patient does have leukocytosis to 12.9 which is increased from his recent ED visits.  His lactate is normal.  He has some chronic kidney disease but no acute kidney injury.  With the rigors being on my differential I do plan for blood cultures/urine culture.  Have given IV Rocephin here and will broaden to Cipro at home. Plan for close PCP follow up. Discussed strict ED return precautions.    ____________________________________________  FINAL CLINICAL IMPRESSION(S) / ED DIAGNOSES  Final diagnoses:  Rigors     MEDICATIONS GIVEN DURING THIS VISIT:  Medications  sodium chloride 0.9 % bolus 500 mL ( Intravenous Stopped 07/16/20 1747)  cefTRIAXone (ROCEPHIN) 1 g in sodium chloride 0.9 % 100 mL IVPB (0 g Intravenous Stopped 07/16/20 1744)     NEW OUTPATIENT MEDICATIONS STARTED DURING THIS VISIT:  Discharge Medication List as of 07/16/2020  5:50 PM    START taking these medications   Details  ciprofloxacin (CIPRO) 500 MG tablet Take 1 tablet (500 mg total) by mouth 2 (two) times daily for 5 days., Starting Sun 07/16/2020, Until Fri 07/21/2020, Normal        Note:  This document was prepared using Dragon voice recognition software and may include unintentional dictation errors.  Nanda Quinton, MD, [facility] Emergency Medicine    Anikah Hogge, [facility], MD 07/17/20 [phone]

## 2020-07-17 LAB — URINE CULTURE: Culture: NO GROWTH

## 2020-07-21 LAB — CULTURE, BLOOD (ROUTINE X 2)
Culture: NO GROWTH
Culture: NO GROWTH
Special Requests: ADEQUATE
Special Requests: ADEQUATE

## 2020-07-27 ENCOUNTER — Other Ambulatory Visit: Payer: Self-pay

## 2020-07-27 ENCOUNTER — Emergency Department (HOSPITAL_BASED_OUTPATIENT_CLINIC_OR_DEPARTMENT_OTHER)
Admission: EM | Admit: 2020-07-27 | Discharge: 2020-07-27 | Disposition: A | Discharge status: Home / Self Care | Payer: No Typology Code available for payment source | Attending: Emergency Medicine | Admitting: Emergency Medicine

## 2020-07-27 ENCOUNTER — Encounter (HOSPITAL_BASED_OUTPATIENT_CLINIC_OR_DEPARTMENT_OTHER): Payer: Self-pay

## 2020-07-27 DIAGNOSIS — Z79899 Other long term (current) drug therapy: Secondary | ICD-10-CM | POA: Insufficient documentation

## 2020-07-27 DIAGNOSIS — Y846 Urinary catheterization as the cause of abnormal reaction of the patient, or of later complication, without mention of misadventure at the time of the procedure: Secondary | ICD-10-CM | POA: Insufficient documentation

## 2020-07-27 DIAGNOSIS — N184 Chronic kidney disease, stage 4 (severe): Secondary | ICD-10-CM | POA: Diagnosis not present

## 2020-07-27 DIAGNOSIS — Z87891 Personal history of nicotine dependence: Secondary | ICD-10-CM | POA: Insufficient documentation

## 2020-07-27 DIAGNOSIS — T83098A Other mechanical complication of other indwelling urethral catheter, initial encounter: Secondary | ICD-10-CM | POA: Insufficient documentation

## 2020-07-27 DIAGNOSIS — Z96642 Presence of left artificial hip joint: Secondary | ICD-10-CM | POA: Insufficient documentation

## 2020-07-27 DIAGNOSIS — T83091A Other mechanical complication of indwelling urethral catheter, initial encounter: Secondary | ICD-10-CM

## 2020-07-27 DIAGNOSIS — T839XXA Unspecified complication of genitourinary prosthetic device, implant and graft, initial encounter: Secondary | ICD-10-CM

## 2020-07-27 DIAGNOSIS — I129 Hypertensive chronic kidney disease with stage 1 through stage 4 chronic kidney disease, or unspecified chronic kidney disease: Secondary | ICD-10-CM | POA: Insufficient documentation

## 2020-07-27 DIAGNOSIS — Z794 Long term (current) use of insulin: Secondary | ICD-10-CM | POA: Diagnosis not present

## 2020-07-27 DIAGNOSIS — E1122 Type 2 diabetes mellitus with diabetic chronic kidney disease: Secondary | ICD-10-CM | POA: Insufficient documentation

## 2020-07-27 DIAGNOSIS — R1030 Lower abdominal pain, unspecified: Secondary | ICD-10-CM | POA: Insufficient documentation

## 2020-07-27 LAB — URINALYSIS, ROUTINE W REFLEX MICROSCOPIC
Bilirubin Urine: NEGATIVE
Glucose, UA: NEGATIVE mg/dL
Ketones, ur: NEGATIVE mg/dL
Nitrite: NEGATIVE
Protein, ur: 100 mg/dL — AB
Specific Gravity, Urine: 1.015 (ref 1.005–1.030)
pH: 6 (ref 5.0–8.0)

## 2020-07-27 LAB — URINALYSIS, MICROSCOPIC (REFLEX): RBC / HPF: >50 RBC/hpf (ref 0–5)

## 2020-07-27 NOTE — ED Triage Notes (Signed)
Pt reports frequent urinary issues, foley catheter in place, awoke today with minimal output, increased pain.  Recent completion of antibiotics for uti.

## 2020-07-27 NOTE — Discharge Instructions (Signed)
Your history and exam today are consistent with Foley catheter problem and not draining well causing the abdominal swelling and pain.  After repositioning, the urine began to drain and your symptoms resolved.  We did send a culture given your recent infection but there were no nitrites.  Given your lack of dysuria or other urinary changes, will not give antibiotics at this time and will defer to the urology team you are seeing this afternoon.  Please go to that appointment.  We did send a urine culture for them to review as well.  Please rest and stay hydrated and continue outpatient plan.  If any symptoms change or worsen acutely, please return to the nearest emergency department.

## 2020-07-27 NOTE — ED Notes (Signed)
Attempt to irrigate foley with 65ml, flushes easily, pt reports discomfort due to full bladder.  Awaiting provider eval

## 2020-07-27 NOTE — ED Notes (Signed)
Foley balloon deflated and catheter advanced, able to drain freely, drained 665ml. Pt reports relief from pain and distention.  Catheter refastened with securing device.  Specimen collected and sent to lab. Per wife pt has an appointment with urologist this afternoon.  Dr Sherry Ruffing made aware.

## 2020-07-27 NOTE — ED Provider Notes (Signed)
[facility] EMERGENCY DEPARTMENT Provider Note   CSN: [id number] Arrival date & time: 07/27/20  6761     History Chief Complaint  Patient presents with  . Urinary Retention    Indwelling foley    John Walton is a 87 y.o. male.  The history is provided by the patient, the spouse and medical records. No language interpreter was used.  Abdominal Pain Pain location:  Suprapubic Pain quality: aching and fullness   Pain radiates to:  Does not radiate Pain severity:  Moderate Onset quality:  Gradual Duration:  1 day Timing:  Constant Progression:  Worsening Chronicity:  Recurrent Relieved by:  Nothing Worsened by:  Nothing Ineffective treatments:  None tried Associated symptoms: no chest pain, no chills, no constipation, no cough, no diarrhea, no dysuria, no fatigue, no fever, no flatus, no nausea, no shortness of breath and no vomiting        Past Medical History:  Diagnosis Date  . Chicken pox   . Frequent headaches   . Glaucoma   . Hay fever   . History of blood transfusion   . HTN (hypertension)   . Hyperlipidemia   . Hyperlipidemia   . Migraines   . 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[facility])     Patient Active Problem List   Diagnosis Date Noted  . Hypocalcemia   . Protein-calorie malnutrition, severe 03/29/2020  . Hypokalemia 03/29/2020  . Hypernatremia 03/29/2020  . DM (diabetes mellitus), type 2 ([place]) 03/29/2020  . Fever 03/29/2020  . Debility 03/29/2020  . Dysphagia 03/26/2020  . Bilateral hydronephrosis 03/08/2020  . Unilateral primary osteoarthritis, left hip   . Femoral neck fracture ([place]) 03/07/2020  . CKD (chronic kidney disease), stage IV ([place]) 03/07/2020  . Normocytic anemia 03/07/2020  . BPH (benign prostatic hyperplasia) 03/07/2020  . Abdominal mass 03/07/2020  . Scalp hematoma 03/07/2020  . Hypertensive urgency 03/07/2020  . Pain due to onychomycosis of toenails of both feet 08/13/2019  . RBBB 09/02/2014  . Screening,  ischemic heart disease 07/11/2014  . Medicare annual wellness visit, subsequent 07/11/2014  . Prostate cancer screening 07/11/2014  . Essential hypertension 01/11/2014  . Hyperlipidemia 01/11/2014    Past Surgical History:  Procedure Laterality Date  . CHOLECYSTECTOMY, LAPAROSCOPIC    . IR GASTROSTOMY TUBE MOD SED  03/29/2020  . TONSILLECTOMY  1942  . TOTAL HIP ARTHROPLASTY Left 03/08/2020   Procedure: TOTAL HIP ARTHROPLASTY;  Surgeon: Marybelle Killings, MD;  Location: [facility] ORS;  Service: Orthopedics;  Laterality: Left;       Family History  Problem Relation Age of Onset  . Liver disease Mother   . Skin cancer Father   . Skin cancer Paternal Grandfather   . Diabetes Maternal Grandfather     Social History   Tobacco Use  . Smoking status: Former Smoker    Start date: 04/01/1968    Quit date: 01/11/1977    Years since quitting: 43.5  . Smokeless tobacco: Never Used  Vaping Use  . Vaping Use: Never used  Substance Use Topics  . Alcohol use: No  . Drug use: No    Home Medications Prior to Admission medications   Medication Sig Start Date End Date Taking? Authorizing Provider  atorvastatin (LIPITOR) 40 MG tablet Place 1 tablet (40 mg total) into feeding tube daily. 04/03/20  Yes Aline August, MD  carboxymethylcellulose (REFRESH PLUS) 0.5 % SOLN Place 1 drop into both eyes in the morning, at noon, and at bedtime.   Yes [provider]  insulin glargine (LANTUS) 100 UNIT/ML Solostar Pen Inject 15 Units into the skin daily. 04/03/20  Yes Aline August, MD  Insulin Pen Needle 29G X 12.7MM MISC Use as directed 03/13/20  Yes Allie Bossier, MD  latanoprost (XALATAN) 0.005 % ophthalmic solution Place 1 drop into both eyes at bedtime.  03/13/19  Yes [provider]  Nutritional Supplements (FEEDING SUPPLEMENT, OSMOLITE 1.5 CAL,) LIQD Place 237 mLs into feeding tube 5 (five) times daily. 04/03/20  Yes Aline August, MD  Polyethyl Glycol-Propyl Glycol (SYSTANE) 0.4-0.3 %  GEL ophthalmic gel Place 1 application into both eyes in the morning and at bedtime.   Yes [provider]  silodosin (RAPAFLO) 4 MG CAPS capsule Place 1 capsule (4 mg total) into feeding tube daily. 04/04/20  Yes Aline August, MD  SIMBRINZA 1-0.2 % SUSP Place 1 drop into both eyes in the morning and at bedtime.  05/16/19  Yes [provider]  calcium-vitamin D (OSCAL WITH D) 500-200 MG-UNIT tablet Place 1 tablet into feeding tube with breakfast, with lunch, and with evening meal. 04/03/20 05/03/20  Aline August, MD  ferrous sulfate 300 (60 Fe) MG/5ML syrup Place 5 mLs (300 mg total) into feeding tube daily. 04/03/20 05/03/20  Aline August, MD  sucralfate (CARAFATE) 1 GM/10ML suspension Take 10 mLs (1 g total) by mouth 4 (four) times daily -  with meals and at bedtime. 04/03/20   Aline August, MD  Water For Irrigation, Sterile (FREE WATER) SOLN Place 120 mLs into feeding tube 5 (five) times daily. 04/03/20   Aline August, MD    Allergies    Robitussin cold cough+ chest [dextromethorphan-guaifenesin]  Review of Systems   Review of Systems  Constitutional: Negative for chills, diaphoresis, fatigue and fever.  HENT: Negative for congestion.   Respiratory: Negative for cough, chest tightness, shortness of breath and wheezing.   Cardiovascular: Negative for chest pain and palpitations.  Gastrointestinal: Positive for abdominal pain. Negative for constipation, diarrhea, flatus, nausea and vomiting.  Genitourinary: Positive for decreased urine volume. Negative for dysuria, flank pain, frequency, penile discharge, penile pain, penile swelling and testicular pain.  Skin: Negative for rash.  Neurological: Negative for headaches.  Psychiatric/Behavioral: Negative for agitation.  All other systems reviewed and are negative.   Physical Exam Updated Vital Signs BP (!) 152/66   Pulse 100   Temp 97.6 F (36.4 C) (Oral)   Resp 20   Ht 5\' 9"  (1.753 m)   Wt 67 kg   SpO2 100%   BMI 21.81  kg/m   Physical Exam Vitals and nursing note reviewed.  Constitutional:      General: He is not in acute distress.    Appearance: He is well-developed. He is not ill-appearing, toxic-appearing or diaphoretic.  HENT:     Head: Normocephalic and atraumatic.     Mouth/Throat:     Mouth: Mucous membranes are moist.     Pharynx: No oropharyngeal exudate or posterior oropharyngeal erythema.  Eyes:     Conjunctiva/sclera: Conjunctivae normal.  Cardiovascular:     Rate and Rhythm: Normal rate and regular rhythm.     Heart sounds: No murmur heard.   Pulmonary:     Effort: Pulmonary effort is normal. No respiratory distress.     Breath sounds: Normal breath sounds. No wheezing, rhonchi or rales.  Chest:     Chest wall: No tenderness.  Abdominal:     General: Abdomen is flat.     Palpations: Abdomen is soft.  Tenderness: There is abdominal tenderness in the suprapubic area. There is no guarding or rebound.       Comments: Foley catheter in place with minimal output initially.  Tenderness in the lower abdomen.  Musculoskeletal:     Cervical back: Neck supple.  Skin:    General: Skin is warm and dry.     Capillary Refill: Capillary refill takes less than 2 seconds.     Findings: No erythema.  Neurological:     General: No focal deficit present.     Mental Status: He is alert.  Psychiatric:        Mood and Affect: Mood normal.     ED Results / Procedures / Treatments   Labs (all labs ordered are listed, but only abnormal results are displayed) Labs Reviewed  URINALYSIS, ROUTINE W REFLEX MICROSCOPIC - Abnormal; Notable for the following components:      Result Value   APPearance CLOUDY (*)    Hgb urine dipstick LARGE (*)    Protein, ur 100 (*)    Leukocytes,Ua TRACE (*)    All other components within normal limits  URINALYSIS, MICROSCOPIC (REFLEX) - Abnormal; Notable for the following components:   Bacteria, UA RARE (*)    All other components within normal limits   URINE CULTURE    EKG None  Radiology No results found.  Procedures Procedures   Medications Ordered in ED Medications - No data to display  ED Course  I have reviewed the triage vital signs and the nursing notes.  Pertinent labs & imaging results that were available during my care of the patient were reviewed by me and considered in my medical decision making (see chart for details).    MDM Rules/Calculators/A&P                          John Walton is a 87 y.o. male with a past medical history significant for BPH with Foley catheter usage, recent UTI with completion of antibiotics, hypertension, hyperlipidemia, diabetes, and migraines who presents with abdominal discomfort and decreased urine catheter output.  Patient reports that he feels his Foley catheter is clogged again and is having lower abdominal pain.  He reports that started this morning.  He does have happened before.  He reports no fevers, chills, chest pain, shortness of breath, nausea, vomiting, constipation, or diarrhea.  He reports lower abdominal aching and feels like his bladder is distended.  He denies any dysuria and the urine that has been trickling out has not been abnormal appearing as it was when he had infection.  He reports he scheduled to see his urologist this afternoon but due to the discomfort and being clogged he wanted it evaluated.  On exam, abdomen is slightly tender in the lower abdomen but otherwise abdomen is nontender.  Bowel sounds appreciated.  Lungs clear and patient resting comfortably otherwise.  GU exam has Foley catheter in place with no other tenderness present with chaperone present during this.  Bladder scan at the bedside showed over 500 cc of urine.  Had a shared decision-making conversation with patient and wife.  We agreed to troubleshoot the catheter and try to do inflate the balloon and readvanced it as this solved it during her previous clogged.  After this maneuver was  attempted, the Foley catheter began draining.  We will send a urinalysis and culture to help aid his urology management.  If he continues to feel well after being monitored  for period of time, dissipate discharge to follow-up with his urologist's afternoon.  Anticipate discharge shortly.  Urinalysis was completed and shows no nitrites.  Given his lack of dysuria, will defer antibiotic decision to urologist this afternoon.  Culture was sent for them to review.  Patient is feeling much better and has no abdominal discomfort.  They would like to go home to follow-up with urologist.  Patient discharged in good condition with resolution of presenting symptoms of abdominal discomfort and Foley catheter problem.   Final Clinical Impression(s) / ED Diagnoses Final diagnoses:  Obstruction of Foley catheter, initial encounter ([place])  Problem with Foley catheter, initial encounter ([place])  Lower abdominal pain    Rx / DC Orders ED Discharge Orders    None      Clinical Impression: 1. Obstruction of Foley catheter, initial encounter ([place])   2. Problem with Foley catheter, initial encounter ([place])   3. Lower abdominal pain     Disposition: Discharge  Condition: Good  I have discussed the results, Dx and Tx plan with the pt(& family if present). He/she/they expressed understanding and agree(s) with the plan. Discharge instructions discussed at great length. Strict return precautions discussed and pt &/or family have verbalized understanding of the instructions. No further questions at time of discharge.    New Prescriptions   No medications on file    Follow Up: your urologist this afternoon        Jorgia Manthei, Gwenyth Allegra, MD 07/27/20 [phone]

## 2020-07-27 NOTE — ED Notes (Signed)
Bladder scan 525ml

## 2020-07-28 LAB — URINE CULTURE: Culture: NO GROWTH

## 2020-08-25 ENCOUNTER — Encounter (HOSPITAL_BASED_OUTPATIENT_CLINIC_OR_DEPARTMENT_OTHER): Payer: Self-pay | Admitting: Emergency Medicine

## 2020-08-25 ENCOUNTER — Emergency Department (HOSPITAL_BASED_OUTPATIENT_CLINIC_OR_DEPARTMENT_OTHER)
Admission: EM | Admit: 2020-08-25 | Discharge: 2020-08-25 | Disposition: A | Discharge status: Home / Self Care | Payer: No Typology Code available for payment source | Attending: Emergency Medicine | Admitting: Emergency Medicine

## 2020-08-25 ENCOUNTER — Other Ambulatory Visit: Payer: Self-pay

## 2020-08-25 DIAGNOSIS — Z794 Long term (current) use of insulin: Secondary | ICD-10-CM | POA: Diagnosis not present

## 2020-08-25 DIAGNOSIS — T83098A Other mechanical complication of other indwelling urethral catheter, initial encounter: Secondary | ICD-10-CM | POA: Insufficient documentation

## 2020-08-25 DIAGNOSIS — N184 Chronic kidney disease, stage 4 (severe): Secondary | ICD-10-CM | POA: Diagnosis not present

## 2020-08-25 DIAGNOSIS — R339 Retention of urine, unspecified: Secondary | ICD-10-CM | POA: Insufficient documentation

## 2020-08-25 DIAGNOSIS — T83091A Other mechanical complication of indwelling urethral catheter, initial encounter: Secondary | ICD-10-CM

## 2020-08-25 DIAGNOSIS — Z87891 Personal history of nicotine dependence: Secondary | ICD-10-CM | POA: Diagnosis not present

## 2020-08-25 DIAGNOSIS — Z96642 Presence of left artificial hip joint: Secondary | ICD-10-CM | POA: Diagnosis not present

## 2020-08-25 DIAGNOSIS — E119 Type 2 diabetes mellitus without complications: Secondary | ICD-10-CM | POA: Insufficient documentation

## 2020-08-25 DIAGNOSIS — I129 Hypertensive chronic kidney disease with stage 1 through stage 4 chronic kidney disease, or unspecified chronic kidney disease: Secondary | ICD-10-CM | POA: Diagnosis not present

## 2020-08-25 MED ORDER — LIDOCAINE HCL URETHRAL/MUCOSAL 2 % EX GEL
1.0000 "application " | Freq: Once | CUTANEOUS | Status: AC
  Administered 2020-08-25: 1 via URETHRAL
  Filled 2020-08-25: qty 11

## 2020-08-25 NOTE — ED Provider Notes (Signed)
[facility] EMERGENCY DEPARTMENT Provider Note   CSN: [id number] Arrival date & time: 08/25/20  1955     History Chief Complaint  Patient presents with  . Urinary Retention    John Walton is a 87 y.o. male.  Patient with chronic and indwelling Foley catheter with obstruction.  Stepped on his catheter today and has not worked for the last several hours.  Has enlarged prostate and therefore has Foley catheter at this time.  Retention for the last 8 hours.  The history is provided by the patient.  Male GU Problem Context: spontaneously   Relieved by:  Nothing Worsened by:  Nothing Ineffective treatments:  None tried Associated symptoms: urinary retention   Associated symptoms: no abdominal pain and no flank pain   Risk factors: urinary catheter        Past Medical History:  Diagnosis Date  . Chicken pox   . Frequent headaches   . Glaucoma   . Hay fever   . History of blood transfusion   . HTN (hypertension)   . Hyperlipidemia   . Hyperlipidemia   . Migraines   . 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[facility])     Patient Active Problem List   Diagnosis Date Noted  . Hypocalcemia   . Protein-calorie malnutrition, severe 03/29/2020  . Hypokalemia 03/29/2020  . Hypernatremia 03/29/2020  . DM (diabetes mellitus), type 2 ([place]) 03/29/2020  . Fever 03/29/2020  . Debility 03/29/2020  . Dysphagia 03/26/2020  . Bilateral hydronephrosis 03/08/2020  . Unilateral primary osteoarthritis, left hip   . Femoral neck fracture ([place]) 03/07/2020  . CKD (chronic kidney disease), stage IV ([place]) 03/07/2020  . Normocytic anemia 03/07/2020  . BPH (benign prostatic hyperplasia) 03/07/2020  . Abdominal mass 03/07/2020  . Scalp hematoma 03/07/2020  . Hypertensive urgency 03/07/2020  . Pain due to onychomycosis of toenails of both feet 08/13/2019  . RBBB 09/02/2014  . Screening, ischemic heart disease 07/11/2014  . Medicare annual wellness visit, subsequent 07/11/2014  .  Prostate cancer screening 07/11/2014  . Essential hypertension 01/11/2014  . Hyperlipidemia 01/11/2014    Past Surgical History:  Procedure Laterality Date  . CHOLECYSTECTOMY, LAPAROSCOPIC    . IR GASTROSTOMY TUBE MOD SED  03/29/2020  . TONSILLECTOMY  1942  . TOTAL HIP ARTHROPLASTY Left 03/08/2020   Procedure: TOTAL HIP ARTHROPLASTY;  Surgeon: Marybelle Killings, MD;  Location: [facility]S;  Service: Orthopedics;  Laterality: Left;       Family History  Problem Relation Age of Onset  . Liver disease Mother   . Skin cancer Father   . Skin cancer Paternal Grandfather   . Diabetes Maternal Grandfather     Social History   Tobacco Use  . Smoking status: Former Smoker    Start date: 04/01/1968    Quit date: 01/11/1977    Years since quitting: 43.6  . Smokeless tobacco: Never Used  Vaping Use  . Vaping Use: Never used  Substance Use Topics  . Alcohol use: No  . Drug use: No    Home Medications Prior to Admission medications   Medication Sig Start Date End Date Taking? Authorizing Provider  atorvastatin (LIPITOR) 40 MG tablet Place 1 tablet (40 mg total) into feeding tube daily. 04/03/20   Aline August, MD  calcium-vitamin D (OSCAL WITH D) 500-200 MG-UNIT tablet Place 1 tablet into feeding tube with breakfast, with lunch, and with evening meal. 04/03/20 05/03/20  Aline August, MD  carboxymethylcellulose (REFRESH PLUS) 0.5 % SOLN Place 1 drop into  both eyes in the morning, at noon, and at bedtime.    [provider]  ferrous sulfate 300 (60 Fe) MG/5ML syrup Place 5 mLs (300 mg total) into feeding tube daily. 04/03/20 05/03/20  Aline August, MD  insulin glargine (LANTUS) 100 UNIT/ML Solostar Pen Inject 15 Units into the skin daily. 04/03/20   Aline August, MD  Insulin Pen Needle 29G X 12.7MM MISC Use as directed 03/13/20   Allie Bossier, MD  latanoprost (XALATAN) 0.005 % ophthalmic solution Place 1 drop into both eyes at bedtime.  03/13/19   [provider]  Nutritional  Supplements (FEEDING SUPPLEMENT, OSMOLITE 1.5 CAL,) LIQD Place 237 mLs into feeding tube 5 (five) times daily. 04/03/20   Aline August, MD  Polyethyl Glycol-Propyl Glycol (SYSTANE) 0.4-0.3 % GEL ophthalmic gel Place 1 application into both eyes in the morning and at bedtime.    [provider]  silodosin (RAPAFLO) 4 MG CAPS capsule Place 1 capsule (4 mg total) into feeding tube daily. 04/04/20   Aline August, MD  SIMBRINZA 1-0.2 % SUSP Place 1 drop into both eyes in the morning and at bedtime.  05/16/19   [provider]  sucralfate (CARAFATE) 1 GM/10ML suspension Take 10 mLs (1 g total) by mouth 4 (four) times daily -  with meals and at bedtime. 04/03/20   Aline August, MD  Water For Irrigation, Sterile (FREE WATER) SOLN Place 120 mLs into feeding tube 5 (five) times daily. 04/03/20   Aline August, MD    Allergies    Robitussin cold cough+ chest [dextromethorphan-guaifenesin]  Review of Systems   Review of Systems  Gastrointestinal: Negative for abdominal pain.  Genitourinary: Negative for flank pain.    Physical Exam Updated Vital Signs BP (!) 158/73 (BP Location: Right Arm)   Pulse (!) 105   Temp 97.8 F (36.6 C) (Oral)   Resp 16   Ht 5\' 9"  (1.753 m)   Wt 67 kg   SpO2 98%   BMI 21.81 kg/m   Physical Exam Constitutional:      General: He is not in acute distress.    Appearance: He is not ill-appearing.  Eyes:     Pupils: Pupils are equal, round, and reactive to light.  Abdominal:     General: There is distension.     Tenderness: There is no abdominal tenderness. There is no guarding.  Neurological:     Mental Status: He is alert.     ED Results / Procedures / Treatments   Labs (all labs ordered are listed, but only abnormal results are displayed) Labs Reviewed - No data to display  EKG None  Radiology No results found.  Procedures Procedures   Medications Ordered in ED Medications  lidocaine (XYLOCAINE) 2 % jelly 1 application (1  application Urethral Given 08/25/20 2019)    ED Course  I have reviewed the triage vital signs and the nursing notes.  Pertinent labs & imaging results that were available during my care of the patient were reviewed by me and considered in my medical decision making (see chart for details).    MDM Rules/Calculators/A&P                          [name] is here with Foley catheter issue.  Has chronic Foley at this time for enlarged prostate.  States that he thinks that he dislodged his Foley after he stepped on it by accident.  Foley was replaced and  there was 300 cc of urine that came out.  No blood.  Stable and discharged.  This chart was dictated using voice recognition software.  Despite best efforts to proofread,  errors can occur which can change the documentation meaning.   Final Clinical Impression(s) / ED Diagnoses Final diagnoses:  Obstruction of Foley catheter, initial encounter [facility])    Rx / [place] Orders ED Discharge Orders    None       Lennice Sites, DO 08/25/20 2037

## 2020-08-25 NOTE — ED Triage Notes (Signed)
Patient presents via EMS from home with complaints of urinary retention; foley in place but states not draining properly today; states feels like bladder is about to bust.

## 2020-08-25 NOTE — ED Notes (Signed)
Existing foley cath not draining; no leakage around penis noted. Balloon deflated and attempted to advance catheter. Unsuccessful; foley removed and replaced per order.

## 2020-08-25 NOTE — ED Notes (Signed)
Gave patient one leg bag and 2 stat locks; educated on both and how to change bag from leg bag and large collection bag. Demonstrated both and gave supplied to family; verbalized understanding.

## 2020-09-20 ENCOUNTER — Other Ambulatory Visit: Payer: Self-pay | Admitting: *Deleted

## 2020-09-20 DIAGNOSIS — I7025 Atherosclerosis of native arteries of other extremities with ulceration: Secondary | ICD-10-CM

## 2020-09-25 ENCOUNTER — Other Ambulatory Visit: Payer: Self-pay

## 2020-09-25 ENCOUNTER — Ambulatory Visit (HOSPITAL_COMMUNITY)
Admission: RE | Admit: 2020-09-25 | Discharge: 2020-09-25 | Disposition: A | Discharge status: Home / Self Care | Payer: Medicare Other | Source: Ambulatory Visit | Attending: Vascular Surgery | Admitting: Vascular Surgery

## 2020-09-25 DIAGNOSIS — I7025 Atherosclerosis of native arteries of other extremities with ulceration: Secondary | ICD-10-CM

## 2020-09-27 ENCOUNTER — Other Ambulatory Visit: Payer: Self-pay

## 2020-09-27 ENCOUNTER — Encounter: Payer: Self-pay | Admitting: Vascular Surgery

## 2020-09-27 ENCOUNTER — Ambulatory Visit (INDEPENDENT_AMBULATORY_CARE_PROVIDER_SITE_OTHER): Payer: No Typology Code available for payment source | Admitting: Vascular Surgery

## 2020-09-27 ENCOUNTER — Other Ambulatory Visit (HOSPITAL_COMMUNITY): Payer: Self-pay | Admitting: Vascular Surgery

## 2020-09-27 ENCOUNTER — Ambulatory Visit (HOSPITAL_COMMUNITY)
Admission: RE | Admit: 2020-09-27 | Discharge: 2020-09-27 | Disposition: A | Discharge status: Home / Self Care | Payer: Medicare Other | Source: Ambulatory Visit | Attending: Vascular Surgery | Admitting: Vascular Surgery

## 2020-09-27 VITALS — BP 115/71 | HR 88 | Temp 98.5°F | Resp 20 | Ht 69.0 in | Wt 147.0 lb

## 2020-09-27 DIAGNOSIS — I70299 Other atherosclerosis of native arteries of extremities, unspecified extremity: Secondary | ICD-10-CM | POA: Diagnosis not present

## 2020-09-27 DIAGNOSIS — L97909 Non-pressure chronic ulcer of unspecified part of unspecified lower leg with unspecified severity: Secondary | ICD-10-CM

## 2020-09-27 DIAGNOSIS — R0989 Other specified symptoms and signs involving the circulatory and respiratory systems: Secondary | ICD-10-CM | POA: Diagnosis not present

## 2020-09-27 NOTE — Progress Notes (Signed)
ASSESSMENT & PLAN   RIGHT HEEL ULCER WITH PERIPHERAL VASCULAR DISEASE: He has evidence of mild infrainguinal arterial occlusive disease on the right but has a reasonable toe pressure and the wound on the right heel is healing.  For this reason I would not recommend an aggressive work-up unless the wound fails to heal.  He is 87 years old and somewhat debilitated and certainly with the increased risk for an aggressive approach.  Fortunately, he is not a smoker.  I be happy to see him back at any time if the wound fails to heal.  60 TO 79% RIGHT CAROTID STENOSIS: On exam this patient had a left carotid bruit.  For this reason we obtained a carotid duplex scan which shows a 60 to 79% right carotid stenosis with no significant stenosis on the left.  Interestingly, the bruit was on the opposite side and I rechecked on exam.  Regardless, the patient has an asymptomatic 60 to 79% right carotid stenosis.  We would not consider right carotid endarterectomy unless the patient develops new right hemispheric symptoms of the stenosis progressed to greater than 80%.  I have ordered a follow-up carotid duplex scan in 6 months.  He is on a statin.  He refuses to take aspirin.  REASON FOR CONSULT:    Chronic right heel ulcer.  The consult is requested by Dr. Iona Coach.  HPI:   John Walton is a 87 y.o. male who was referred with a chronic right heel ulcer.  I reviewed the records from the referring office.  The patient was seen by podiatry on 09/04/2020 with a chronic heel pressure ulcer secondary to a lengthy hospitalization in December 2021.  He had evidence of peripheral vascular disease and vascular consultation.  On my history, the patient broke his left hip and required hip surgery in December 2021.  From there he went to rehab.  He was noted to have a right heel ulcer.  His wife showed me pictures of the wound originally and clearly the wound has improved significantly since it was originally  discovered.  His activity is very limited he does walk with a walker.  I do not get any history of claudication.  He denies any history of rest pain.  His risk factors for peripheral vascular disease include diabetes, hypertension, hypercholesterolemia, and a remote history of tobacco use.  He quit smoking in 1970.  He is on a statin but feels strongly about not taking aspirin.  Past Medical History:  Diagnosis Date   Chicken pox    Frequent headaches    Glaucoma    Hay fever    History of blood transfusion    HTN (hypertension)    Hyperlipidemia    Hyperlipidemia    Migraines    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([place])     Family History  Problem Relation Age of Onset   Liver disease Mother    Skin cancer Father    Skin cancer Paternal Grandfather    Diabetes Maternal Grandfather     SOCIAL HISTORY: Social History   Tobacco Use   Smoking status: Former    Pack years: 0.00    Types: Cigarettes    Start date: 04/01/1968    Quit date: 01/11/1977    Years since quitting: 43.7   Smokeless tobacco: Never  Substance Use Topics   Alcohol use: No    Allergies  Allergen Reactions   Robitussin Cold Cough+ Chest [Dextromethorphan-Guaifenesin] Swelling    Laryngeal edema -  any cough syrup    Current Outpatient Medications  Medication Sig Dispense Refill   atorvastatin (LIPITOR) 40 MG tablet Place 1 tablet (40 mg total) into feeding tube daily. 30 tablet 0   carboxymethylcellulose (REFRESH PLUS) 0.5 % SOLN Place 1 drop into both eyes in the morning, at noon, and at bedtime.     insulin glargine (LANTUS) 100 UNIT/ML Solostar Pen Inject 15 Units into the skin daily.     Insulin Pen Needle 29G X 12.7MM MISC Use as directed 100 each 0   latanoprost (XALATAN) 0.005 % ophthalmic solution Place 1 drop into both eyes at bedtime.      Nutritional Supplements (FEEDING SUPPLEMENT, OSMOLITE 1.5 CAL,) LIQD Place 237 mLs into feeding tube 5 (five) times daily. 1000 mL 15   Polyethyl  Glycol-Propyl Glycol (SYSTANE) 0.4-0.3 % GEL ophthalmic gel Place 1 application into both eyes in the morning and at bedtime.     silodosin (RAPAFLO) 4 MG CAPS capsule Place 1 capsule (4 mg total) into feeding tube daily. 30 capsule 0   SIMBRINZA 1-0.2 % SUSP Place 1 drop into both eyes in the morning and at bedtime.      sucralfate (CARAFATE) 1 GM/10ML suspension Take 10 mLs (1 g total) by mouth 4 (four) times daily -  with meals and at bedtime. 420 mL 0   Water For Irrigation, Sterile (FREE WATER) SOLN Place 120 mLs into feeding tube 5 (five) times daily. 1000 mL 15   calcium-vitamin D (OSCAL WITH D) 500-200 MG-UNIT tablet Place 1 tablet into feeding tube with breakfast, with lunch, and with evening meal. 90 tablet 0   ferrous sulfate 300 (60 Fe) MG/5ML syrup Place 5 mLs (300 mg total) into feeding tube daily. 150 mL 0   No current facility-administered medications for this visit.    REVIEW OF SYSTEMS:  [X]  denotes positive finding, [ ]  denotes negative finding Cardiac  Comments:  Chest pain or chest pressure:    Shortness of breath upon exertion:    Short of breath when lying flat:    Irregular heart rhythm:        Vascular    Pain in calf, thigh, or hip brought on by ambulation:    Pain in feet at night that wakes you up from your sleep:     Blood clot in your veins:    Leg swelling:         Pulmonary    Oxygen at home:    Productive cough:     Wheezing:         Neurologic    Sudden weakness in arms or legs:     Sudden numbness in arms or legs:     Sudden onset of difficulty speaking or slurred speech:    Temporary loss of vision in one eye:     Problems with dizziness:         Gastrointestinal    Blood in stool:     Vomited blood:         Genitourinary    Burning when urinating:     Blood in urine:        Psychiatric    Major depression:         Hematologic    Bleeding problems:    Problems with blood clotting too easily:        Skin    Rashes or ulcers: x        Constitutional    Fever or chills:    -  PHYSICAL EXAM:   Vitals:   09/27/20 1314  BP: 115/71  Pulse: 88  Resp: 20  Temp: 98.5 F (36.9 C)  SpO2: 96%  Weight: 147 lb (66.7 kg)  Height: 5\' 9"  (1.753 m)   Body mass index is 21.71 kg/m.  GENERAL: The patient is a well-nourished male, in no acute distress. The vital signs are documented above. CARDIAC: There is a regular rate and rhythm.  VASCULAR: He has a left carotid bruit. On the right side, which is the side of concern, he has a palpable femoral pulse.  I cannot palpate a popliteal pulse.  He does have a palpable dorsalis pedis pulse.  I cannot palpate a posterior tibial pulse although he has some ankle swelling which makes it more difficult to assess. On the left side he has a palpable femoral popliteal and dorsalis pedis pulse.  I cannot palpate a posterior tibial pulse.  Again he has ankle swelling which makes this more difficult to assess. PULMONARY: There is good air exchange bilaterally without wheezing or rales. ABDOMEN: Soft and non-tender with normal pitched bowel sounds.  I do not palpate an abdominal aortic aneurysm. MUSCULOSKELETAL: There are no major deformities. NEUROLOGIC: No focal weakness or paresthesias are detected. SKIN: He has a small wound on his right heel.   PSYCHIATRIC: The patient has a normal affect.  DATA:    ARTERIAL DOPPLER STUDY: I have reviewed the arterial Doppler study that was done on 09/25/2020.  On the right side there is a triphasic dorsalis pedis and posterior tibial signal.  ABIs 100%.  Toe pressures 133 mmHg.  On the left side there is a triphasic dorsalis pedis signal with a biphasic posterior tibial signal.  ABIs 100%.  Toe pressures 106 mmHg.  CAROTID DUPLEX: I have independently interpreted his carotid duplex scan today.  On the right side he has a 60 to 79% proximal ICA stenosis.  The right vertebral artery is patent with antegrade flow.  On the left side he has no  significant carotid stenosis.  The left vertebral artery is patent with antegrade flow.   Deitra Mayo [facility]

## 2020-09-29 ENCOUNTER — Other Ambulatory Visit: Payer: Self-pay

## 2020-09-29 DIAGNOSIS — R0989 Other specified symptoms and signs involving the circulatory and respiratory systems: Secondary | ICD-10-CM

## 2020-10-02 ENCOUNTER — Emergency Department (HOSPITAL_BASED_OUTPATIENT_CLINIC_OR_DEPARTMENT_OTHER)
Admission: EM | Admit: 2020-10-02 | Discharge: 2020-10-02 | Disposition: A | Discharge status: Home / Self Care | Payer: No Typology Code available for payment source | Attending: Emergency Medicine | Admitting: Emergency Medicine

## 2020-10-02 ENCOUNTER — Encounter (HOSPITAL_BASED_OUTPATIENT_CLINIC_OR_DEPARTMENT_OTHER): Payer: Self-pay | Admitting: *Deleted

## 2020-10-02 ENCOUNTER — Other Ambulatory Visit: Payer: Self-pay

## 2020-10-02 DIAGNOSIS — I129 Hypertensive chronic kidney disease with stage 1 through stage 4 chronic kidney disease, or unspecified chronic kidney disease: Secondary | ICD-10-CM | POA: Diagnosis not present

## 2020-10-02 DIAGNOSIS — N184 Chronic kidney disease, stage 4 (severe): Secondary | ICD-10-CM | POA: Insufficient documentation

## 2020-10-02 DIAGNOSIS — Z79899 Other long term (current) drug therapy: Secondary | ICD-10-CM | POA: Insufficient documentation

## 2020-10-02 DIAGNOSIS — R1030 Lower abdominal pain, unspecified: Secondary | ICD-10-CM | POA: Diagnosis present

## 2020-10-02 DIAGNOSIS — Z96642 Presence of left artificial hip joint: Secondary | ICD-10-CM | POA: Insufficient documentation

## 2020-10-02 DIAGNOSIS — Z794 Long term (current) use of insulin: Secondary | ICD-10-CM | POA: Diagnosis not present

## 2020-10-02 DIAGNOSIS — Z87891 Personal history of nicotine dependence: Secondary | ICD-10-CM | POA: Insufficient documentation

## 2020-10-02 DIAGNOSIS — N3000 Acute cystitis without hematuria: Secondary | ICD-10-CM | POA: Diagnosis not present

## 2020-10-02 DIAGNOSIS — E1122 Type 2 diabetes mellitus with diabetic chronic kidney disease: Secondary | ICD-10-CM | POA: Diagnosis not present

## 2020-10-02 LAB — URINALYSIS, MICROSCOPIC (REFLEX): WBC, UA: >50 WBC/hpf (ref 0–5)

## 2020-10-02 LAB — URINALYSIS, ROUTINE W REFLEX MICROSCOPIC
Bilirubin Urine: NEGATIVE
Glucose, UA: NEGATIVE mg/dL
Ketones, ur: NEGATIVE mg/dL
Nitrite: POSITIVE — AB
Protein, ur: 100 mg/dL — AB
Specific Gravity, Urine: 1.02 (ref 1.005–1.030)
pH: 5.5 (ref 5.0–8.0)

## 2020-10-02 MED ORDER — OXYBUTYNIN CHLORIDE ER 5 MG PO TB24: 10.0000 mg | ORAL_TABLET | Freq: Three times a day (TID) | ORAL | 0 refills | Status: DC | PRN

## 2020-10-02 MED ORDER — CEPHALEXIN 250 MG PO CAPS
500.0000 mg | ORAL_CAPSULE | Freq: Once | ORAL | Status: AC
  Administered 2020-10-02: 500 mg via ORAL
  Filled 2020-10-02: qty 2

## 2020-10-02 MED ORDER — OXYBUTYNIN CHLORIDE ER 5 MG PO TB24: 5.0000 mg | ORAL_TABLET | Freq: Every day | ORAL | 0 refills | Status: AC

## 2020-10-02 MED ORDER — CEPHALEXIN 500 MG PO CAPS: 500.0000 mg | ORAL_CAPSULE | Freq: Two times a day (BID) | ORAL | 0 refills | Status: AC

## 2020-10-02 NOTE — Discharge Instructions (Addendum)
We will treat for possible urine infection.  Urine culture has been sent.  Take antibiotic as prescribed.  Please return if patient develops fever or any change in mental status.  Use oxybutynin as prescribed for bladder spasms.

## 2020-10-02 NOTE — ED Provider Notes (Signed)
[facility] EMERGENCY DEPARTMENT Provider Note   CSN: [id number] Arrival date & time: 10/02/20  1731     History Chief Complaint  Patient presents with   Abdominal Pain    John Walton is a 87 y.o. male.  Patient having some bladder spasms, concern for Foley catheter problem.  Urine looks cloudy per caregiver.  Not having any current abdominal discomfort.  New Foley was placed 3 days ago and there was some difficulty getting the Foley in.  He had some hematuria 3 days ago that has now resolved.  He has been putting out over a liter of urine a day which is normal for him.  The history is provided by the patient and a caregiver.  Abdominal Pain Pain location:  Suprapubic Pain quality: aching   Pain severity:  Mild Onset quality:  Gradual Duration:  3 days Timing:  Intermittent Progression:  Waxing and waning Chronicity:  New Relieved by:  Nothing Worsened by:  Nothing Associated symptoms: no chest pain, no chills, no cough, no diarrhea, no dysuria, no fever, no hematuria, no nausea, no shortness of breath, no sore throat and no vomiting   Risk factors: has not had multiple surgeries       Past Medical History:  Diagnosis Date   Chicken pox    Frequent headaches    Glaucoma    Hay fever    History of blood transfusion    HTN (hypertension)    Hyperlipidemia    Hyperlipidemia    Migraines    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[facility])     Patient Active Problem List   Diagnosis Date Noted   Hypocalcemia    Protein-calorie malnutrition, severe 03/29/2020   Hypokalemia 03/29/2020   Hypernatremia 03/29/2020   DM (diabetes mellitus), type 2 ([place]) 03/29/2020   Fever 03/29/2020   Debility 03/29/2020   Dysphagia 03/26/2020   Bilateral hydronephrosis 03/08/2020   Unilateral primary osteoarthritis, left hip    Femoral neck fracture ([place]) 03/07/2020   CKD (chronic kidney disease), stage IV ([facility]) 03/07/2020   Normocytic anemia 03/07/2020   BPH (benign  prostatic hyperplasia) 03/07/2020   Abdominal mass 03/07/2020   Scalp hematoma 03/07/2020   Hypertensive urgency 03/07/2020   Pain due to onychomycosis of toenails of both feet 08/13/2019   RBBB 09/02/2014   Screening, ischemic heart disease 07/11/2014   Medicare annual wellness visit, subsequent 07/11/2014   Prostate cancer screening 07/11/2014   Essential hypertension 01/11/2014   Hyperlipidemia 01/11/2014    Past Surgical History:  Procedure Laterality Date   CHOLECYSTECTOMY, LAPAROSCOPIC     IR GASTROSTOMY TUBE MOD SED  03/29/2020   TONSILLECTOMY  1942   TOTAL HIP ARTHROPLASTY Left 03/08/2020   Procedure: TOTAL HIP ARTHROPLASTY;  Surgeon: Marybelle Killings, MD;  Location: [facility]S;  Service: Orthopedics;  Laterality: Left;       Family History  Problem Relation Age of Onset   Liver disease Mother    Skin cancer Father    Skin cancer Paternal Grandfather    Diabetes Maternal Grandfather     Social History   Tobacco Use   Smoking status: Former    Pack years: 0.00    Types: Cigarettes    Start date: 04/01/1968    Quit date: 01/11/1977    Years since quitting: 43.7   Smokeless tobacco: Never  Vaping Use   Vaping Use: Never used  Substance Use Topics   Alcohol use: No   Drug use: No    Home Medications  Prior to Admission medications   Medication Sig Start Date End Date Taking? Authorizing Provider  cephALEXin (KEFLEX) 500 MG capsule Take 1 capsule (500 mg total) by mouth 2 (two) times daily for 10 days. 10/02/20 10/12/20 Yes Ashanti Ratti, DO  atorvastatin (LIPITOR) 40 MG tablet Place 1 tablet (40 mg total) into feeding tube daily. 04/03/20   Aline August, MD  calcium-vitamin D (OSCAL WITH D) 500-200 MG-UNIT tablet Place 1 tablet into feeding tube with breakfast, with lunch, and with evening meal. 04/03/20 05/03/20  Aline August, MD  carboxymethylcellulose (REFRESH PLUS) 0.5 % SOLN Place 1 drop into both eyes in the morning, at noon, and at bedtime.    [provider]  ferrous sulfate 300 (60 Fe) MG/5ML syrup Place 5 mLs (300 mg total) into feeding tube daily. 04/03/20 05/03/20  Aline August, MD  insulin glargine (LANTUS) 100 UNIT/ML Solostar Pen Inject 15 Units into the skin daily. 04/03/20   Aline August, MD  Insulin Pen Needle 29G X 12.7MM MISC Use as directed 03/13/20   Allie Bossier, MD  latanoprost (XALATAN) 0.005 % ophthalmic solution Place 1 drop into both eyes at bedtime.  03/13/19   [provider]  Nutritional Supplements (FEEDING SUPPLEMENT, OSMOLITE 1.5 CAL,) LIQD Place 237 mLs into feeding tube 5 (five) times daily. 04/03/20   Aline August, MD  oxybutynin (DITROPAN XL) 5 MG 24 hr tablet Take 1 tablet (5 mg total) by mouth at bedtime for 10 doses. 10/02/20 10/12/20  Dessa Ledee, DO  Polyethyl Glycol-Propyl Glycol (SYSTANE) 0.4-0.3 % GEL ophthalmic gel Place 1 application into both eyes in the morning and at bedtime.    [provider]  silodosin (RAPAFLO) 4 MG CAPS capsule Place 1 capsule (4 mg total) into feeding tube daily. 04/04/20   Aline August, MD  SIMBRINZA 1-0.2 % SUSP Place 1 drop into both eyes in the morning and at bedtime.  05/16/19   [provider]  sucralfate (CARAFATE) 1 GM/10ML suspension Take 10 mLs (1 g total) by mouth 4 (four) times daily -  with meals and at bedtime. 04/03/20   Aline August, MD  Water For Irrigation, Sterile (FREE WATER) SOLN Place 120 mLs into feeding tube 5 (five) times daily. 04/03/20   Aline August, MD    Allergies    Robitussin cold cough+ chest [dextromethorphan-guaifenesin]  Review of Systems   Review of Systems  Constitutional:  Negative for chills and fever.  HENT:  Negative for ear pain and sore throat.   Eyes:  Negative for pain and visual disturbance.  Respiratory:  Negative for cough and shortness of breath.   Cardiovascular:  Negative for chest pain and palpitations.  Gastrointestinal:  Positive for abdominal pain. Negative for diarrhea, nausea and vomiting.   Genitourinary:  Negative for decreased urine volume, difficulty urinating, dysuria, hematuria, scrotal swelling and testicular pain.  Musculoskeletal:  Negative for arthralgias and back pain.  Skin:  Negative for color change and rash.  Neurological:  Negative for seizures and syncope.  All other systems reviewed and are negative.  Physical Exam Updated Vital Signs BP (!) 149/70   Pulse 89   Temp 98.8 F (37.1 C) (Oral)   Resp 20   Ht 5\' 6"  (1.676 m)   Wt 65.3 kg   SpO2 99%   BMI 23.24 kg/m   Physical Exam Vitals and nursing note reviewed.  Constitutional:      Appearance: He is well-developed.  HENT:     Head: Normocephalic and atraumatic.  Comments: No midline spinal pain    Mouth/Throat:     Mouth: Mucous membranes are moist.     Pharynx: Oropharynx is clear.  Eyes:     Extraocular Movements: Extraocular movements intact.     Conjunctiva/sclera: Conjunctivae normal.     Pupils: Pupils are equal, round, and reactive to light.  Cardiovascular:     Rate and Rhythm: Normal rate and regular rhythm.     Heart sounds: Normal heart sounds. No murmur heard. Pulmonary:     Effort: Pulmonary effort is normal. No respiratory distress.     Breath sounds: Normal breath sounds.  Abdominal:     Palpations: Abdomen is soft.     Tenderness: There is no abdominal tenderness.  Musculoskeletal:     Cervical back: Neck supple.  Skin:    General: Skin is warm and dry.     Capillary Refill: Capillary refill takes less than 2 seconds.  Neurological:     General: No focal deficit present.     Mental Status: He is alert and oriented to person, place, and time.     Cranial Nerves: No cranial nerve deficit.     Motor: No weakness.     Comments: 5+ out of 5 strength throughout, normal sensation,    ED Results / Procedures / Treatments   Labs (all labs ordered are listed, but only abnormal results are displayed) Labs Reviewed  URINALYSIS, ROUTINE W REFLEX MICROSCOPIC - Abnormal;  Notable for the following components:      Result Value   APPearance CLOUDY (*)    Hgb urine dipstick SMALL (*)    Protein, ur 100 (*)    Nitrite POSITIVE (*)    Leukocytes,Ua MODERATE (*)    All other components within normal limits  URINALYSIS, MICROSCOPIC (REFLEX) - Abnormal; Notable for the following components:   Bacteria, UA MANY (*)    All other components within normal limits  URINE CULTURE    EKG None  Radiology No results found.  Procedures Procedures   Medications Ordered in ED Medications  cephALEXin (KEFLEX) capsule 500 mg (has no administration in time range)    ED Course  I have reviewed the triage vital signs and the nursing notes.  Pertinent labs & imaging results that were available during my care of the patient were reviewed by me and considered in my medical decision making (see chart for details).    MDM Rules/Calculators/A&P                          [name] is here with bladder spasms.  Normal vitals.  No fever.  Has been having some spasms in his lower abdomen since new Foley catheter placed 3 days ago.  There is good active drainage from his Foley.  He has put out normal amount of urine from his Foley catheter.  No concern for obstruction.  Questionable if there is an infection because he is having some spasms at times.  No fever.  Well-appearing.  Urinalysis consistent with infection.  Prior cultures have been difficult to evaluate.  Will treat with Keflex.  No concern for sepsis.  No current pain now.  Will give oxybutynin to use as needed for spasms.  Discharged in good condition.  Family also states may be some left hand tingling recently.  Neurologically he is intact on exam.  Neurovascularly he is intact.  Has no neck pain.  Could be some type of peripheral neuropathy.  Recommend follow-up with PCP if persistent.  This chart was dictated using voice recognition software.  Despite best efforts to proofread,  errors can occur which can  change the documentation meaning.   Final Clinical Impression(s) / ED Diagnoses Final diagnoses:  Acute cystitis without hematuria    Rx / DC Orders ED Discharge Orders          Ordered    cephALEXin (KEFLEX) 500 MG capsule  2 times daily        10/02/20 1850    oxybutynin (DITROPAN XL) 5 MG 24 hr tablet  Every 8 hours PRN,   Status:  Discontinued        10/02/20 1850    oxybutynin (DITROPAN XL) 5 MG 24 hr tablet  Daily at bedtime        10/02/20 Pax, [place], DO 10/02/20 1853

## 2020-10-02 NOTE — ED Notes (Signed)
ED Provider at bedside. 

## 2020-10-02 NOTE — ED Triage Notes (Signed)
C/o abd pain ? Urine infection , catheter changed x 3 days ago

## 2020-10-04 LAB — URINE CULTURE: Culture: >=100000 — AB

## 2020-10-05 ENCOUNTER — Telehealth: Payer: Self-pay

## 2020-10-05 NOTE — Telephone Encounter (Signed)
Post ED Visit - Positive Culture Follow-up  Culture report reviewed by antimicrobial stewardship pharmacist: [facility] Team [x]  Joetta Manners, Pharm.D. []  Heide Guile, Pharm.D., BCPS AQ-ID []  Parks Neptune, Pharm.D., BCPS []  Alycia Rossetti, Pharm.D., BCPS []  [place], Pharm.D., BCPS, AAHIVP []  Legrand Como, Pharm.D., BCPS, AAHIVP []  Salome Arnt, PharmD, BCPS []  Johnnette Gourd, PharmD, BCPS []  Hughes Better, PharmD, BCPS []  Leeroy Cha, PharmD []  Laqueta Linden, PharmD, BCPS []  Albertina Parr, PharmD  [place] Team []  Leodis Sias, PharmD []  Lindell Spar, PharmD []  Royetta Asal, PharmD []  Graylin Shiver, Rph []  Rema Fendt) Glennon Mac, PharmD []  Arlyn Dunning, PharmD []  [name], PharmD []  Dia Sitter, PharmD []  Leone Haven, PharmD []  Gretta Arab, PharmD []  Theodis Shove, PharmD []  Peggyann Juba, PharmD []  Reuel Boom, PharmD   Positive urine culture Treated with Cephalexin, organism sensitive to the same and no further patient follow-up is required at this time.  Glennon Hamilton 10/05/2020, 9:25 AM

## 2020-11-09 ENCOUNTER — Other Ambulatory Visit: Payer: Self-pay | Admitting: Urology

## 2020-11-15 ENCOUNTER — Other Ambulatory Visit: Payer: Self-pay | Admitting: Urology

## 2020-11-20 NOTE — Patient Instructions (Signed)
DUE TO COVID-19 ONLY ONE VISITOR IS ALLOWED TO COME WITH YOU AND STAY IN THE WAITING ROOM ONLY DURING PRE OP AND PROCEDURE.   **NO VISITORS ARE ALLOWED IN THE SHORT STAY AREA OR RECOVERY ROOM!!**  IF YOU WILL BE ADMITTED INTO THE HOSPITAL YOU ARE ALLOWED ONLY TWO SUPPORT PEOPLE DURING VISITATION HOURS ONLY (10AM -8PM)   The support person(s) may change daily. The support person(s) must pass our screening, gel in and out, and wear a mask at all times, including in the patient's room. Patients must also wear a mask when staff or their support person are in the room.  No visitors under the age of 16. Any visitor under the age of 30 must be accompanied by an adult.    COVID SWAB TESTING MUST BE COMPLETED ON: 12/12/20  **MUST PRESENT COMPLETED FORM AT TESTING SITE**    [place] (backside of the building) You are not required to quarantine, however you are required to wear a well-fitted mask when you are out and around people not in your household.  Hand Hygiene often Do NOT share personal items Notify your provider if you are in close contact with someone who has COVID or you develop fever 100.4 or greater, new onset of sneezing, cough, sore throat, shortness of breath or body aches.  [address], [address], must go inside of the hospital, NOT A DRIVE THRU!  (Must self quarantine after testing. Follow instructions on handout.)       Your procedure is scheduled on: 12/14/20   Report to [facility] Main  Entrance    Report to admitting at : 10:15 AM   Call this number if you have problems the morning of surgery [phone]   Do not eat food :After Midnight.   May have liquids until : 9:30 AM   day of surgery  CLEAR LIQUID DIET  Foods Allowed                                                                     Foods Excluded  Water, Black Coffee and tea, regular and decaf                              liquids that you cannot  Plain Jell-O in any flavor  (No red)                                           see through such as: Fruit ices (not with fruit pulp)                                     milk, soups, orange juice              Iced Popsicles (No red)  All solid food                                   Apple juices Sports drinks like Gatorade (No red) Lightly seasoned clear broth or consume(fat free) Sugar  Sample Menu Breakfast                                Lunch                                     Supper Cranberry juice                    Beef broth                            Chicken broth Jell-O                                     Grape juice                           Apple juice Coffee or tea                        Jell-O                                      Popsicle                                                Coffee or tea                        Coffee or tea      Oral Hygiene is also important to reduce your risk of infection.                                    Remember - BRUSH YOUR TEETH THE MORNING OF SURGERY WITH YOUR REGULAR TOOTHPASTE   Do NOT smoke after Midnight   Take these medicines the morning of surgery with A SIP OF WATER: tapazole. Use eye drops as usual.                              You may not have any metal on your body including hair pins, jewelry, and body piercing             Do not wear lotions, powders, perfumes/cologne, or deodorant              Men may shave face and neck.   Do not bring valuables to the hospital. [place].   Contacts, dentures or bridgework may not be  worn into surgery.   Bring small overnight bag day of surgery.    Patients discharged the day of surgery will not be allowed to drive home.   Special Instructions: Bring a copy of your healthcare power of attorney and living will documents         the day of surgery if you haven't  scanned them in before.              Please read over the following fact sheets you were given: IF YOU HAVE QUESTIONS ABOUT YOUR PRE OP INSTRUCTIONS PLEASE CALL [phone]   [place] - Preparing for Surgery Before surgery, you can play an important role.  Because skin is not sterile, your skin needs to be as free of germs as possible.  You can reduce the number of germs on your skin by washing with CHG (chlorahexidine gluconate) soap before surgery.  CHG is an antiseptic cleaner which kills germs and bonds with the skin to continue killing germs even after washing. Please DO NOT use if you have an allergy to CHG or antibacterial soaps.  If your skin becomes reddened/irritated stop using the CHG and inform your nurse when you arrive at Short Stay. Do not shave (including legs and underarms) for at least 48 hours prior to the first CHG shower.  You may shave your face/neck. Please follow these instructions carefully:  1.  Shower with CHG Soap the night before surgery and the  morning of Surgery.  2.  If you choose to wash your hair, wash your hair first as usual with your  normal  shampoo.  3.  After you shampoo, rinse your hair and body thoroughly to remove the  shampoo.                           4.  Use CHG as you would any other liquid soap.  You can apply chg directly  to the skin and wash                       Gently with a scrungie or clean washcloth.  5.  Apply the CHG Soap to your body ONLY FROM THE NECK DOWN.   Do not use on face/ open                           Wound or open sores. Avoid contact with eyes, ears mouth and genitals (private parts).                       Wash face,  Genitals (private parts) with your normal soap.             6.  Wash thoroughly, paying special attention to the area where your surgery  will be performed.  7.  Thoroughly rinse your body with warm water from the neck down.  8.  DO NOT shower/wash with your normal soap after using and rinsing off  the CHG  Soap.                9.  Pat yourself dry with a clean towel.            10.  Wear clean pajamas.            11.  Place clean sheets on your bed the night of your first shower and do not  sleep with  pets. Day of Surgery : Do not apply any lotions/deodorants the morning of surgery.  Please wear clean clothes to the hospital/surgery center.  FAILURE TO FOLLOW THESE INSTRUCTIONS MAY RESULT IN THE CANCELLATION OF YOUR SURGERY PATIENT SIGNATURE_________________________________  NURSE SIGNATURE__________________________________  ________________________________________________________________________

## 2020-11-21 ENCOUNTER — Encounter (HOSPITAL_COMMUNITY): Payer: Self-pay

## 2020-11-21 ENCOUNTER — Other Ambulatory Visit: Payer: Self-pay

## 2020-11-21 ENCOUNTER — Encounter (HOSPITAL_COMMUNITY)
Admission: RE | Admit: 2020-11-21 | Discharge: 2020-11-21 | Disposition: A | Discharge status: Home / Self Care | Payer: Medicare Other | Source: Ambulatory Visit | Attending: Urology | Admitting: Urology

## 2020-11-21 DIAGNOSIS — Z01812 Encounter for preprocedural laboratory examination: Secondary | ICD-10-CM | POA: Diagnosis present

## 2020-11-21 HISTORY — DX: Hypothyroidism, unspecified: E03.9

## 2020-11-21 HISTORY — DX: Type 2 diabetes mellitus without complications: E11.9

## 2020-11-21 HISTORY — DX: Anemia, unspecified: D64.9

## 2020-11-21 HISTORY — DX: Chronic kidney disease, unspecified: N18.9

## 2020-11-21 HISTORY — DX: Atherosclerotic heart disease of native coronary artery without angina pectoris: I25.10

## 2020-11-21 HISTORY — DX: Peripheral vascular disease, unspecified: I73.9

## 2020-11-21 LAB — BASIC METABOLIC PANEL
Anion gap: 7 (ref 5–15)
BUN: 46 mg/dL — ABNORMAL HIGH (ref 8–23)
CO2: 23 mmol/L (ref 22–32)
Calcium: 8.8 mg/dL — ABNORMAL LOW (ref 8.9–10.3)
Chloride: 106 mmol/L (ref 98–111)
Creatinine, Ser: 2.11 mg/dL — ABNORMAL HIGH (ref 0.61–1.24)
GFR, Estimated: 30 mL/min — ABNORMAL LOW (ref >60)
Glucose, Bld: 237 mg/dL — ABNORMAL HIGH (ref 70–99)
Potassium: 5.4 mmol/L — ABNORMAL HIGH (ref 3.5–5.1)
Sodium: 136 mmol/L (ref 135–145)

## 2020-11-21 LAB — CBC
HCT: 32 % — ABNORMAL LOW (ref 39.0–52.0)
Hemoglobin: 10 g/dL — ABNORMAL LOW (ref 13.0–17.0)
MCH: 27.5 pg (ref 26.0–34.0)
MCHC: 31.3 g/dL (ref 30.0–36.0)
MCV: 88.2 fL (ref 80.0–100.0)
Platelets: 264 10*3/uL (ref 150–400)
RBC: 3.63 MIL/uL — ABNORMAL LOW (ref 4.22–5.81)
RDW: 14.4 % (ref 11.5–15.5)
WBC: 6.4 10*3/uL (ref 4.0–10.5)
nRBC: 0 % (ref 0.0–0.2)

## 2020-11-21 LAB — GLUCOSE, CAPILLARY: Glucose-Capillary: 228 mg/dL — ABNORMAL HIGH (ref 70–99)

## 2020-11-21 LAB — HEMOGLOBIN A1C
Hgb A1c MFr Bld: 7.1 % — ABNORMAL HIGH (ref 4.8–5.6)
Mean Plasma Glucose: 157.07 mg/dL

## 2020-11-21 NOTE — Progress Notes (Addendum)
COVID Vaccine Completed: Yes Date COVID Vaccine completed: 11/07/20. X 4 COVID vaccine manufacturer:    Moderna    Covid test: 12/12/20 PCP - Dr. Domenick Gong Cardiologist - Dr. Tim Lair. LOV: 06/22/20.  Chest x-ray - 07/16/20 EKG - 07/21/20 Stress Test -  ECHO - 03/08/20 Cardiac Cath -  Pacemaker/ICD device last checked:  Sleep Study -  CPAP -   Fasting Blood Sugar -  Checks Blood Sugar _____ times a day  Blood Thinner Instructions: Aspirin Instructions: Last Dose:  Anesthesia review: Hx: HTN,CKD,Rt. BBB,pressure ulcer on right heel.  Patient denies shortness of breath, fever, cough and chest pain at PAT appointment   Patient verbalized understanding of instructions that were given to them at the PAT appointment. Patient was also instructed that they will need to review over the PAT instructions again at home before surgery.

## 2020-11-22 NOTE — Progress Notes (Signed)
Lab. Results: Creatinine: 2.11 Hemoglobin: 10

## 2020-11-30 NOTE — Progress Notes (Signed)
Anesthesia Chart Review   Case: [id number] Date/Time: 12/14/20 1215   Procedure: TRANSURETHRAL RESECTION OF THE PROSTATE (TURP) - 2 HRS   Anesthesia type: General   Pre-op diagnosis: BENIGN PROSTATIC HYPERPLASIA   Location: [place] / [facility] ORS   Surgeons: Franchot Gallo, MD       DISCUSSION:88 y.o. former smoker with h/o HTN, DM II (A1C 7.1 11/21/2020), mild to moderate AS (AV area 1.5 cm2, mean gradient 12 mmHg on Echo 03/08/2020), CKD, BPH scheduled for above procedure 12/14/2020 with Dr. Franchot Gallo.   Bilateral hydronephrosis, foley dependent since inpatient stay 12/21.   Pt followed by vascular surgery for right heel ulcer due to PVD and right carotid stenosis.  Last seen 09/27/2020. Per OV note, " On exam this patient had a left carotid bruit.  For this reason we obtained a carotid duplex scan which shows a 60 to 79% right carotid stenosis with no significant stenosis on the left.  Interestingly, the bruit was on the opposite side and I rechecked on exam.  Regardless, the patient has an asymptomatic 60 to 79% right carotid stenosis.  We would not consider right carotid endarterectomy unless the patient develops new right hemispheric symptoms of the stenosis progressed to greater than 80%.  I have ordered a follow-up carotid duplex scan in 6 months.  He is on a statin.  He refuses to take aspirin."  Pt seen by cardiology 06/22/2020. Per OV note, "He is at low risk of major cardiac complications with the proposed low risk procedure.  He is not taking anticoagulation. He is not taking aspirin regularly and if he is, can stop five days prior."  Anticipate pt can proceed with planned procedure barring acute status change and after evaluation DOS.  VS: BP (!) 106/54   Pulse 90   Temp 36.4 C (Oral)   Ht 5\' 6"  (1.676 m)   SpO2 100%   BMI 23.24 kg/m   PROVIDERS: Tisovec, Fransico Him, MD is PCP   Azucena Kuba, MD is Cardiologist with [facility]:  recheck BMP DOS (all labs  ordered are listed, but only abnormal results are displayed)  Labs Reviewed  CBC - Abnormal; Notable for the following components:      Result Value   RBC 3.63 (*)    Hemoglobin 10.0 (*)    HCT 32.0 (*)    All other components within normal limits  BASIC METABOLIC PANEL - Abnormal; Notable for the following components:   Potassium 5.4 (*)    Glucose, Bld 237 (*)    BUN 46 (*)    Creatinine, Ser 2.11 (*)    Calcium 8.8 (*)    GFR, Estimated 30 (*)    All other components within normal limits  HEMOGLOBIN A1C - Abnormal; Notable for the following components:   Hgb A1c MFr Bld 7.1 (*)    All other components within normal limits  GLUCOSE, CAPILLARY - Abnormal; Notable for the following components:   Glucose-Capillary 228 (*)    All other components within normal limits     IMAGES:   EKG:   CV: Echo 03/08/2020  1. Left ventricular ejection fraction, by estimation, is 55 to 60%. The  left ventricle has normal function. The left ventricle has no regional  wall motion abnormalities. There is moderate left ventricular hypertrophy.  Left ventricular diastolic  parameters are indeterminate.   2. Right ventricular systolic function is normal. The right ventricular  size is normal. Tricuspid regurgitation signal is inadequate for assessing  PA pressure.   3. The mitral valve is normal in structure. No evidence of mitral valve  regurgitation.   4. Aortic dilatation noted. There is mild dilatation of the aortic root,  measuring 40 mm.   5. The inferior vena cava is normal in size with greater than 50%  respiratory variability, suggesting right atrial pressure of 3 mmHg.   6. The aortic valve is calcified. There is moderate calcification of the  aortic valve. Aortic valve regurgitation is not visualized. Mild to  moderate aortic valve stenosis. Vmax 2.2 m/s, MG 12 mmHg, AVA 1.5 cm^2, DI  0.33  Past Medical History:  Diagnosis Date   Anemia    Chicken pox    Chronic kidney  disease    Coronary artery disease    Diabetes mellitus without complication (HCC)    Frequent headaches    Glaucoma    Hay fever    History of blood transfusion    HTN (hypertension)    Hyperlipidemia    Hyperlipidemia    Hypothyroidism    Migraines    Peripheral vascular disease ([place])    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[facility])     Past Surgical History:  Procedure Laterality Date   CHOLECYSTECTOMY, LAPAROSCOPIC     IR GASTROSTOMY TUBE MOD SED  03/29/2020   TONSILLECTOMY  04/01/1938   TOTAL HIP ARTHROPLASTY Left 03/08/2020   Procedure: TOTAL HIP ARTHROPLASTY;  Surgeon: Marybelle Killings, MD;  Location: [facility] ORS;  Service: Orthopedics;  Laterality: Left;    MEDICATIONS:  atorvastatin (LIPITOR) 40 MG tablet   calcium-vitamin D (OSCAL WITH D) 500-200 MG-UNIT tablet   carboxymethylcellulose (REFRESH PLUS) 0.5 % SOLN   collagenase (SANTYL) ointment   Insulin Pen Needle 29G X 12.7MM MISC   latanoprost (XALATAN) 0.005 % ophthalmic solution   Lifitegrast (XIIDRA) 5 % SOLN   methimazole (TAPAZOLE) 10 MG tablet   Nutritional Supplements (FEEDING SUPPLEMENT, OSMOLITE 1.5 CAL,) LIQD   Nutritional Supplements (PROSOURCE) LIQD   SIMBRINZA 1-0.2 % SUSP   sucralfate (CARAFATE) 1 GM/10ML suspension   Water For Irrigation, Sterile (FREE WATER) SOLN   White Petrolatum-Mineral Oil (TEARS AGAIN) OINT   No current facility-administered medications for this encounter.    Konrad Felix Ward, PA-C [facility] Pre-Surgical Testing [phone]

## 2020-11-30 NOTE — Anesthesia Preprocedure Evaluation (Addendum)
Anesthesia Evaluation  Patient identified by MRN, date of birth, ID band Patient awake    Reviewed: Allergy & Precautions, NPO status , Patient's Chart, lab work & pertinent test results  Airway Mallampati: II  TM Distance: >3 FB Neck ROM: Full    Dental  (+) Chipped, Missing, Poor Dentition   Pulmonary neg pulmonary ROS, former smoker,    Pulmonary exam normal        Cardiovascular hypertension, + CAD and + Peripheral Vascular Disease  + Valvular Problems/Murmurs (mild/mod) AS  Rhythm:Regular Rate:Normal     Neuro/Psych  Headaches, negative psych ROS   GI/Hepatic   Endo/Other  diabetes, Type 2Hypothyroidism   Renal/GU CRFRenal disease Bladder dysfunction  BPH    Musculoskeletal  (+) Arthritis ,   Abdominal (+)  Abdomen: soft.    Peds  Hematology  (+) anemia ,   Anesthesia Other Findings   Reproductive/Obstetrics                           Anesthesia Physical Anesthesia Plan  ASA: 3  Anesthesia Plan: General   Post-op Pain Management:    Induction: Intravenous  PONV Risk Score and Plan: 2 and Ondansetron and Treatment may vary due to age or medical condition  Airway Management Planned: LMA  Additional Equipment: None  Intra-op Plan:   Post-operative Plan: Extubation in OR  Informed Consent: I have reviewed the patients History and Physical, chart, labs and discussed the procedure including the risks, benefits and alternatives for the proposed anesthesia with the patient or authorized representative who has indicated his/her understanding and acceptance.     Dental advisory given  Plan Discussed with: CRNA  Anesthesia Plan Comments: (See PAT note 11/21/2020, Konrad Felix Ward, PA-C Pt followed by vascular surgery for right heel ulcer due to PVD and right carotid stenosis.  Last seen 09/27/2020. Per OV note, "On exam this patient had a left carotid bruit. For this  reason we obtained a carotid duplex scan which showsa 60 to 79% right carotid stenosis with no significant stenosis on the left. Interestingly, the bruit was on the opposite side and I rechecked on exam. Regardless, the patient has an asymptomatic 60 to 79% right carotid stenosis. We would not consider right carotid endarterectomy unless the patient develops new right hemispheric symptoms of the stenosis progressed to greater than 80%. I have ordered a follow-up carotid duplex scan in 6 months. He is on a statin. He refuses to take aspirin."  Pt seen by cardiology 06/22/2020. Per OV note, "He is at low risk of major cardiac complications with the proposed low risk procedure.  He is not taking anticoagulation. He is not taking aspirin regularly and if he is, can stop five days prior."  Lab Results      Component                Value               Date                      WBC                      6.4                 11/21/2020                HGB  10.0 (L)            11/21/2020                HCT                      32.0 (L)            11/21/2020                MCV                      88.2                11/21/2020                PLT                      264                 11/21/2020           Lab Results      Component                Value               Date                      NA                       136                 11/21/2020                K                        5.4 (H)             11/21/2020                CO2                      23                  11/21/2020                GLUCOSE                  237 (H)             11/21/2020                BUN                      46 (H)              11/21/2020                CREATININE               2.11 (H)            11/21/2020                CALCIUM                  8.8 (L)             11/21/2020  GFRNONAA                 30 (L)              11/21/2020                GFRAA                    40 (L)               04/23/2015            Echo 03/08/2020 1. Left ventricular ejection fraction, by estimation, is 55 to 60%. The  left ventricle has normal function. The left ventricle has no regional  wall motion abnormalities. There is moderate left ventricular hypertrophy.  Left ventricular diastolic  parameters are indeterminate.  2. Right ventricular systolic function is normal. The right ventricular  size is normal. Tricuspid regurgitation signal is inadequate for assessing  PA pressure.  3. The mitral valve is normal in structure. No evidence of mitral valve  regurgitation.  4. Aortic dilatation noted. There is mild dilatation of the aortic root,  measuring 40 mm.  5. The inferior vena cava is normal in size with greater than 50%  respiratory variability, suggesting right atrial pressure of 3 mmHg.  6. The aortic valve is calcified. There is moderate calcification of the  aortic valve. Aortic valve regurgitation is not visualized. Mild to  moderate aortic valve stenosis. Vmax 2.2 m/s, MG 12 mmHg, AVA 1.5 cm^2, DI  0.33 )     Anesthesia Quick Evaluation

## 2020-12-12 ENCOUNTER — Other Ambulatory Visit: Payer: Self-pay | Admitting: Urology

## 2020-12-12 LAB — SARS CORONAVIRUS 2 (TAT 6-24 HRS): SARS Coronavirus 2: NEGATIVE

## 2020-12-14 ENCOUNTER — Encounter (HOSPITAL_COMMUNITY)
Admission: RE | Disposition: A | Discharge status: Home / Self Care | Payer: Self-pay | Source: Ambulatory Visit | Attending: Urology

## 2020-12-14 ENCOUNTER — Ambulatory Visit (HOSPITAL_COMMUNITY): Payer: No Typology Code available for payment source | Admitting: Physician Assistant

## 2020-12-14 ENCOUNTER — Other Ambulatory Visit: Payer: Self-pay

## 2020-12-14 ENCOUNTER — Ambulatory Visit (HOSPITAL_COMMUNITY): Payer: No Typology Code available for payment source | Admitting: Anesthesiology

## 2020-12-14 ENCOUNTER — Observation Stay (HOSPITAL_COMMUNITY)
Admission: RE | Admit: 2020-12-14 | Discharge: 2020-12-15 | Disposition: A | Discharge status: Home / Self Care | Payer: No Typology Code available for payment source | Source: Ambulatory Visit | Attending: Urology | Admitting: Urology

## 2020-12-14 ENCOUNTER — Encounter (HOSPITAL_COMMUNITY): Payer: Self-pay | Admitting: Urology

## 2020-12-14 DIAGNOSIS — N189 Chronic kidney disease, unspecified: Secondary | ICD-10-CM | POA: Insufficient documentation

## 2020-12-14 DIAGNOSIS — Z96642 Presence of left artificial hip joint: Secondary | ICD-10-CM | POA: Diagnosis not present

## 2020-12-14 DIAGNOSIS — N320 Bladder-neck obstruction: Secondary | ICD-10-CM | POA: Insufficient documentation

## 2020-12-14 DIAGNOSIS — E1122 Type 2 diabetes mellitus with diabetic chronic kidney disease: Secondary | ICD-10-CM | POA: Insufficient documentation

## 2020-12-14 DIAGNOSIS — E039 Hypothyroidism, unspecified: Secondary | ICD-10-CM | POA: Diagnosis not present

## 2020-12-14 DIAGNOSIS — Z87891 Personal history of nicotine dependence: Secondary | ICD-10-CM | POA: Insufficient documentation

## 2020-12-14 DIAGNOSIS — I129 Hypertensive chronic kidney disease with stage 1 through stage 4 chronic kidney disease, or unspecified chronic kidney disease: Secondary | ICD-10-CM | POA: Insufficient documentation

## 2020-12-14 DIAGNOSIS — N138 Other obstructive and reflux uropathy: Secondary | ICD-10-CM | POA: Diagnosis not present

## 2020-12-14 DIAGNOSIS — R338 Other retention of urine: Secondary | ICD-10-CM | POA: Insufficient documentation

## 2020-12-14 DIAGNOSIS — N401 Enlarged prostate with lower urinary tract symptoms: Secondary | ICD-10-CM | POA: Diagnosis not present

## 2020-12-14 HISTORY — PX: TRANSURETHRAL RESECTION OF PROSTATE: SHX73

## 2020-12-14 LAB — GLUCOSE, CAPILLARY
Glucose-Capillary: 108 mg/dL — ABNORMAL HIGH (ref 70–99)
Glucose-Capillary: 120 mg/dL — ABNORMAL HIGH (ref 70–99)
Glucose-Capillary: 162 mg/dL — ABNORMAL HIGH (ref 70–99)
Glucose-Capillary: 181 mg/dL — ABNORMAL HIGH (ref 70–99)

## 2020-12-14 LAB — BASIC METABOLIC PANEL
Anion gap: 5 (ref 5–15)
BUN: 39 mg/dL — ABNORMAL HIGH (ref 8–23)
CO2: 21 mmol/L — ABNORMAL LOW (ref 22–32)
Calcium: 8.6 mg/dL — ABNORMAL LOW (ref 8.9–10.3)
Chloride: 108 mmol/L (ref 98–111)
Creatinine, Ser: 2.39 mg/dL — ABNORMAL HIGH (ref 0.61–1.24)
GFR, Estimated: 26 mL/min — ABNORMAL LOW (ref >60)
Glucose, Bld: 112 mg/dL — ABNORMAL HIGH (ref 70–99)
Potassium: 4.2 mmol/L (ref 3.5–5.1)
Sodium: 134 mmol/L — ABNORMAL LOW (ref 135–145)

## 2020-12-14 SURGERY — TURP (TRANSURETHRAL RESECTION OF PROSTATE)
Anesthesia: General

## 2020-12-14 MED ORDER — ONDANSETRON HCL 4 MG/2ML IJ SOLN
INTRAMUSCULAR | Status: DC | PRN
  Administered 2020-12-14: 4 mg via INTRAVENOUS

## 2020-12-14 MED ORDER — INSULIN ASPART 100 UNIT/ML IJ SOLN
0.0000 [IU] | Freq: Three times a day (TID) | INTRAMUSCULAR | Status: DC
  Administered 2020-12-14: 3 [IU] via SUBCUTANEOUS
  Administered 2020-12-15: 2 [IU] via SUBCUTANEOUS

## 2020-12-14 MED ORDER — FENTANYL CITRATE PF 50 MCG/ML IJ SOSY
25.0000 ug | PREFILLED_SYRINGE | INTRAMUSCULAR | Status: DC | PRN
  Administered 2020-12-14: 50 ug via INTRAVENOUS
  Administered 2020-12-14: 25 ug via INTRAVENOUS

## 2020-12-14 MED ORDER — SENNA 8.6 MG PO TABS
1.0000 | ORAL_TABLET | Freq: Two times a day (BID) | ORAL | Status: DC
  Administered 2020-12-14 – 2020-12-15 (×2): 8.6 mg via ORAL
  Filled 2020-12-14 (×2): qty 1

## 2020-12-14 MED ORDER — LATANOPROST 0.005 % OP SOLN
1.0000 [drp] | Freq: Every day | OPHTHALMIC | Status: DC
  Filled 2020-12-14: qty 2.5

## 2020-12-14 MED ORDER — LIFITEGRAST 5 % OP SOLN
1.0000 [drp] | Freq: Two times a day (BID) | OPHTHALMIC | Status: DC
  Administered 2020-12-14 – 2020-12-15 (×2): 1 [drp] via OPHTHALMIC

## 2020-12-14 MED ORDER — METHIMAZOLE 10 MG PO TABS
10.0000 mg | ORAL_TABLET | Freq: Every morning | ORAL | Status: DC
  Administered 2020-12-15: 10 mg via ORAL
  Filled 2020-12-14: qty 1

## 2020-12-14 MED ORDER — BELLADONNA ALKALOIDS-OPIUM 16.2-60 MG RE SUPP: 1.0000 | Freq: Four times a day (QID) | RECTAL | Status: DC | PRN

## 2020-12-14 MED ORDER — ATORVASTATIN CALCIUM 40 MG PO TABS
40.0000 mg | ORAL_TABLET | Freq: Every day | ORAL | Status: DC
  Administered 2020-12-14: 40 mg via ORAL
  Filled 2020-12-14: qty 1

## 2020-12-14 MED ORDER — PHENYLEPHRINE 40 MCG/ML (10ML) SYRINGE FOR IV PUSH (FOR BLOOD PRESSURE SUPPORT)
PREFILLED_SYRINGE | INTRAVENOUS | Status: AC
  Filled 2020-12-14: qty 10

## 2020-12-14 MED ORDER — PHENYLEPHRINE 40 MCG/ML (10ML) SYRINGE FOR IV PUSH (FOR BLOOD PRESSURE SUPPORT)
PREFILLED_SYRINGE | INTRAVENOUS | Status: AC
  Filled 2020-12-14: qty 20

## 2020-12-14 MED ORDER — SODIUM CHLORIDE 0.9 % IR SOLN
Status: DC | PRN
  Administered 2020-12-14: 39000 mL via INTRAVESICAL

## 2020-12-14 MED ORDER — LIFITEGRAST 5 % OP SOLN: 1.0000 [drp] | Freq: Every day | OPHTHALMIC | Status: DC

## 2020-12-14 MED ORDER — LIDOCAINE 2% (20 MG/ML) 5 ML SYRINGE
INTRAMUSCULAR | Status: DC | PRN
  Administered 2020-12-14: 60 mg via INTRAVENOUS

## 2020-12-14 MED ORDER — CEFAZOLIN SODIUM-DEXTROSE 2-4 GM/100ML-% IV SOLN
INTRAVENOUS | Status: AC
  Filled 2020-12-14: qty 100

## 2020-12-14 MED ORDER — CHLORHEXIDINE GLUCONATE 0.12 % MT SOLN
15.0000 mL | Freq: Once | OROMUCOSAL | Status: AC
  Administered 2020-12-14: 15 mL via OROMUCOSAL

## 2020-12-14 MED ORDER — FENTANYL CITRATE PF 50 MCG/ML IJ SOSY
PREFILLED_SYRINGE | INTRAMUSCULAR | Status: AC
  Filled 2020-12-14: qty 3

## 2020-12-14 MED ORDER — FENTANYL CITRATE (PF) 100 MCG/2ML IJ SOLN
INTRAMUSCULAR | Status: DC | PRN
  Administered 2020-12-14 (×4): 25 ug via INTRAVENOUS

## 2020-12-14 MED ORDER — HYDROCODONE-ACETAMINOPHEN 5-325 MG PO TABS
1.0000 | ORAL_TABLET | Freq: Four times a day (QID) | ORAL | Status: DC | PRN
  Administered 2020-12-14 (×2): 1 via ORAL
  Filled 2020-12-14 (×2): qty 1

## 2020-12-14 MED ORDER — LATANOPROST 0.005 % OP SOLN
1.0000 [drp] | Freq: Every day | OPHTHALMIC | Status: DC
  Administered 2020-12-14: 1 [drp] via OPHTHALMIC

## 2020-12-14 MED ORDER — SODIUM CHLORIDE 0.45 % IV SOLN: INTRAVENOUS | Status: DC

## 2020-12-14 MED ORDER — ORAL CARE MOUTH RINSE: 15.0000 mL | Freq: Once | OROMUCOSAL | Status: AC

## 2020-12-14 MED ORDER — FENTANYL CITRATE (PF) 100 MCG/2ML IJ SOLN
INTRAMUSCULAR | Status: AC
  Filled 2020-12-14: qty 2

## 2020-12-14 MED ORDER — SULFAMETHOXAZOLE-TRIMETHOPRIM 800-160 MG PO TABS: 1.0000 | ORAL_TABLET | Freq: Two times a day (BID) | ORAL | Status: DC

## 2020-12-14 MED ORDER — ONDANSETRON HCL 4 MG/2ML IJ SOLN
INTRAMUSCULAR | Status: AC
  Filled 2020-12-14: qty 2

## 2020-12-14 MED ORDER — PROPOFOL 10 MG/ML IV BOLUS
INTRAVENOUS | Status: DC | PRN
  Administered 2020-12-14: 80 mg via INTRAVENOUS

## 2020-12-14 MED ORDER — CEPHALEXIN 250 MG PO CAPS
250.0000 mg | ORAL_CAPSULE | Freq: Two times a day (BID) | ORAL | Status: DC
  Administered 2020-12-14 – 2020-12-15 (×2): 250 mg via ORAL
  Filled 2020-12-14 (×2): qty 1

## 2020-12-14 MED ORDER — LACTATED RINGERS IV SOLN: INTRAVENOUS | Status: DC

## 2020-12-14 MED ORDER — ACETAMINOPHEN 10 MG/ML IV SOLN: 1000.0000 mg | Freq: Once | INTRAVENOUS | Status: DC | PRN

## 2020-12-14 MED ORDER — CEFAZOLIN SODIUM-DEXTROSE 2-4 GM/100ML-% IV SOLN
2.0000 g | INTRAVENOUS | Status: AC
  Administered 2020-12-14: 2 g via INTRAVENOUS

## 2020-12-14 MED ORDER — DEXAMETHASONE SODIUM PHOSPHATE 10 MG/ML IJ SOLN
INTRAMUSCULAR | Status: DC | PRN
  Administered 2020-12-14: 4 mg via INTRAVENOUS

## 2020-12-14 MED ORDER — PHENYLEPHRINE 40 MCG/ML (10ML) SYRINGE FOR IV PUSH (FOR BLOOD PRESSURE SUPPORT)
PREFILLED_SYRINGE | INTRAVENOUS | Status: DC | PRN
  Administered 2020-12-14 (×4): 80 ug via INTRAVENOUS
  Administered 2020-12-14: 160 ug via INTRAVENOUS
  Administered 2020-12-14: 80 ug via INTRAVENOUS
  Administered 2020-12-14: 160 ug via INTRAVENOUS
  Administered 2020-12-14: 80 ug via INTRAVENOUS
  Administered 2020-12-14: 160 ug via INTRAVENOUS
  Administered 2020-12-14 (×3): 80 ug via INTRAVENOUS

## 2020-12-14 MED ORDER — MUPIROCIN 2 % EX OINT
1.0000 "application " | TOPICAL_OINTMENT | Freq: Two times a day (BID) | CUTANEOUS | Status: DC
  Administered 2020-12-14 – 2020-12-15 (×2): 1 via NASAL
  Filled 2020-12-14: qty 22

## 2020-12-14 SURGICAL SUPPLY — 21 items
BAG DRN RND TRDRP ANRFLXCHMBR (UROLOGICAL SUPPLIES) ×1
BAG URINE DRAIN 2000ML AR STRL (UROLOGICAL SUPPLIES) ×2 IMPLANT
BAG URO CATCHER STRL LF (MISCELLANEOUS) ×2 IMPLANT
BLADE SURG 15 STRL LF DISP TIS (BLADE) IMPLANT
BLADE SURG 15 STRL SS (BLADE)
CATH HEMA 3WAY 30CC 22FR COUDE (CATHETERS) ×2 IMPLANT
CATH HEMA 3WAY 30CC 24FR COUDE (CATHETERS) ×1 IMPLANT
DRAPE FOOT SWITCH (DRAPES) ×2 IMPLANT
EVACUATOR MICROVAS BLADDER (UROLOGICAL SUPPLIES) ×2 IMPLANT
GLOVE SURG ENC TEXT LTX SZ8 (GLOVE) ×2 IMPLANT
GOWN STRL REUS W/TWL XL LVL3 (GOWN DISPOSABLE) ×2 IMPLANT
HOLDER FOLEY CATH W/STRAP (MISCELLANEOUS) IMPLANT
KIT TURNOVER KIT A (KITS) ×2 IMPLANT
LOOP CUT BIPOLAR 24F LRG (ELECTROSURGICAL) ×4 IMPLANT
MANIFOLD NEPTUNE II (INSTRUMENTS) ×3 IMPLANT
PACK CYSTO (CUSTOM PROCEDURE TRAY) ×2 IMPLANT
PENCIL SMOKE EVACUATOR (MISCELLANEOUS) IMPLANT
SUT ETHILON 3 0 PS 1 (SUTURE) IMPLANT
SYR 30ML LL (SYRINGE) IMPLANT
TUBING CONNECTING 10 (TUBING) ×2 IMPLANT
TUBING UROLOGY SET (TUBING) ×2 IMPLANT

## 2020-12-14 NOTE — Transfer of Care (Signed)
Immediate Anesthesia Transfer of Care Note  Patient: John Walton  Procedure(s) Performed: TRANSURETHRAL RESECTION OF THE PROSTATE (TURP)  Patient Location: PACU  Anesthesia Type:General  Level of Consciousness: awake, alert  and oriented  Airway & Oxygen Therapy: Patient Spontanous Breathing and Patient connected to face mask oxygen  Post-op Assessment: Report given to RN and Post -op Vital signs reviewed and stable  Post vital signs: Reviewed and stable  Last Vitals:  Vitals Value Taken Time  BP 87/48 12/14/20 1431  Temp    Pulse 59 12/14/20 1433  Resp 14 12/14/20 1433  SpO2 100 % 12/14/20 1433  Vitals shown include unvalidated device data.  Last Pain:  Vitals:   12/14/20 1049  TempSrc:   PainSc: 0-No pain         Complications: No notable events documented.

## 2020-12-14 NOTE — Op Note (Signed)
Preoperative diagnosis: Bladder outlet obstruction secondary to BPH  Postoperative diagnosis:  Bladder outlet obstruction secondary to BPH  Procedure:  Cystoscopy Transurethral resection of the prostate  Surgeon: Lillette Boxer. Basil Blakesley, M.D.  Anesthesia: General  Complications: None  Drain: Foley catheter  EBL: Minimal  Specimens: Prostate chips  Disposition of specimens: Pathology  Indication: John Walton is a patient with bladder outlet obstruction secondary to benign prostatic hyperplasia.  He has been in retention and has been catheter dependent.  He has a very large gland, estimated volume 150 mL.  After reviewing the management options for treatment, he elected to proceed with the above surgical procedure(s). We have discussed the potential benefits and risks of the procedure, side effects of the proposed treatment, the likelihood of the patient achieving the goals of the procedure, and any potential problems that might occur during the procedure or recuperation. Informed consent has been obtained.  Description of procedure:  The patient was identified in the holding area. He received preoperative antibiotics. He was then taken to the operating room. General anesthetic was administered.  The patient was then placed in the dorsal lithotomy position, prepped and draped in the usual sterile fashion. Timeout was then performed.  A resectoscope sheath was placed using the visual obturator.   The bladder was then systematically examined in its entirety. There was no evidence of  tumors, stones, or other mucosal pathology.there was a moderate sized median lobe with intravesical protrusion.  The resectoscope, loop and telescope were then placed.  The ureteral orifices were identified so as to be avoided during the procedure.  The prostate adenoma was then resected utilizing loop cautery resection with the bipolar cutting loop.  Initially, the median lobe was taken down with  resection all the way down to the verumontanum.  The dominant right prostatic lobe was then resected, first anteriorly and resection was then performed from anterior to posterior, down to the surgical capsule.  This gland was quite large, and after resecting the median lobe and the entire right lobe, including apical tissue, there was a more than adequate nonobstructive prostatic fossa.  At this point, due to the patient's age and the size of the prostate, I did not think the left lobe should be resected.  Hemostasis was then achieved with the cautery and the bladder was emptied and reinspected with no significant bleeding noted at the end of the procedure.  Resected chips were irrigated from the bladder with the evacuator and sent to pathology.  A 3 way catheter was then placed into the bladder and placed on continuous bladder irrigation.  The patient appeared to tolerate the procedure well and without complications. The patient was able to be awakened and transferred to the recovery unit in satisfactory condition. He tolerated the procedure well.

## 2020-12-14 NOTE — Anesthesia Postprocedure Evaluation (Signed)
Anesthesia Post Note  Patient: John Walton  Procedure(s) Performed: TRANSURETHRAL RESECTION OF THE PROSTATE (TURP)     Patient location during evaluation: PACU Anesthesia Type: General Level of consciousness: awake and alert Pain management: pain level controlled Vital Signs Assessment: post-procedure vital signs reviewed and stable Respiratory status: spontaneous breathing, nonlabored ventilation, respiratory function stable and patient connected to nasal cannula oxygen Cardiovascular status: blood pressure returned to baseline and stable Postop Assessment: no apparent nausea or vomiting Anesthetic complications: no   No notable events documented.  Last Vitals:  Vitals:   12/14/20 1545 12/14/20 1609  BP: (!) 111/55 (!) 122/54  Pulse: 72 75  Resp: 12 16  Temp:  36.5 C  SpO2: 98% 97%    Last Pain:  Vitals:   12/14/20 1609  TempSrc: Oral  PainSc:                  Effie Berkshire

## 2020-12-14 NOTE — H&P (Signed)
H&P  Chief Complaint: Urinary retention  History of Present Illness: 87 yo male w/ BPH/retention, catheter dependent presents for TURP for mgmt of his issues. He has a large gland--~ 150 mL estimated volume.  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[facility])     Past Surgical History:  Procedure Laterality Date   CHOLECYSTECTOMY, LAPAROSCOPIC     IR GASTROSTOMY TUBE MOD SED  03/29/2020   TONSILLECTOMY  [birth year]01/1940   TOTAL HIP ARTHROPLASTY Left 03/08/2020   Procedure: TOTAL HIP ARTHROPLASTY;  Surgeon: Marybelle Killings, MD;  Location: [facility];  Service: Orthopedics;  Laterality: Left;    Home Medications:    Allergies:  Allergies  Allergen Reactions   Robitussin Cold Cough+ Chest [Dextromethorphan-Guaifenesin] Swelling    Laryngeal edema - any cough syrup    Family History  Problem Relation Age of Onset   Liver disease Mother    Skin cancer Father    Skin cancer Paternal Grandfather    Diabetes Maternal Grandfather     Social History:  reports that he quit smoking about 43 years ago. His smoking use included cigarettes. He started smoking about 52 years ago. He has never used smokeless tobacco. He reports that he does not currently use alcohol. He reports that he does not use drugs.  ROS: A complete review of systems was performed.  All systems are negative except for pertinent findings as noted.  Physical Exam:  Vital signs in last 24 hours: BP (!) 136/58   Pulse 84   Temp 97.6 F (36.4 C) (Oral)   Ht 5\' 6"  (1.676 m)   Wt 64.6 kg   SpO2 100%   BMI 23.00 kg/m  Constitutional:  Alert and oriented, No  acute distress Cardiovascular: Regular rate  Respiratory: Normal respiratory effort GI: Abdomen is soft, nontender, nondistended, no abdominal masses. No CVAT.  Genitourinary: Normal male phallus, testes are descended bilaterally and non-tender and without masses, scrotum is normal in appearance without lesions or masses, perineum is normal on inspection. Lymphatic: No lymphadenopathy Neurologic: Grossly intact, no focal deficits Psychiatric: Normal mood and affect  Laboratory Data:  No results for input(s): WBC, HGB, HCT, PLT in the last 72 hours.  Recent Labs    12/14/20 1100  NA 134*  K 4.2  CL 108  GLUCOSE 112*  BUN 39*  CALCIUM 8.6*  CREATININE 2.39*     Results for orders placed or performed during the hospital encounter of 12/14/20 (from the past 24 hour(s))  Glucose, capillary     Status: Abnormal   Collection Time: 12/14/20 10:42 AM  Result Value Ref Range   Glucose-Capillary 108 (H) 70 - 99 mg/dL   Comment 1 Notify RN   Basic metabolic panel     Status: Abnormal   Collection Time: 12/14/20 11:00 AM  Result Value Ref Range   Sodium 134 (L) 135 - 145 mmol/L   Potassium 4.2 3.5 - 5.1 mmol/L   Chloride 108 98 - 111 mmol/L   CO2 21 (L) 22 - 32  mmol/L   Glucose, Bld 112 (H) 70 - 99 mg/dL   BUN 39 (H) 8 - 23 mg/dL   Creatinine, Ser 2.39 (H) 0.61 - 1.24 mg/dL   Calcium 8.6 (L) 8.9 - 10.3 mg/dL   GFR, Estimated 26 (L) >60 mL/min   Anion gap 5 5 - 15   Recent Results (from the past 240 hour(s))  SARS Coronavirus 2 (TAT 6-24 hrs)     Status: None   Collection Time: 12/12/20 12:00 AM  Result Value Ref Range Status   SARS Coronavirus 2 RESULT: NEGATIVE  Final    Comment: RESULT: NEGATIVESARS-CoV-2 INTERPRETATION:A NEGATIVE  test result means that SARS-CoV-2 RNA was not present in the specimen above the limit of detection of this test. This does not preclude a possible SARS-CoV-2 infection and should not be used as the  sole basis for patient management decisions.  Negative results must be combined with clinical observations, patient history, and epidemiological information. Optimum specimen types and timing for peak viral levels during infections caused by SARS-CoV-2  have not been determined. Collection of multiple specimens or types of specimens may be necessary to detect virus. Improper specimen collection and handling, sequence variability under primers/probes, or organism present below the limit of detection may  lead to false negative results. Positive and negative predictive values of testing are highly dependent on prevalence. False negative test results are more likely when prevalence of disease is high.The expected result is NEGATIVE.Fact S heet for  Healthcare Providers: LocalChronicle.no Sheet for Patients: SalonLookup.es Reference Range - Negative     Renal Function: Recent Labs    12/14/20 1100  CREATININE 2.39*   Estimated Creatinine Clearance: 20 mL/min (A) (by C-G formula based on SCr of 2.39 mg/dL (H)).  Radiologic Imaging: No results found.  Impression/Assessment:  BPH w/ retention  Plan:  TURP

## 2020-12-14 NOTE — Anesthesia Procedure Notes (Signed)
Procedure Name: LMA Insertion Date/Time: 12/14/2020 12:48 PM Performed by: Genelle Bal, CRNA Pre-anesthesia Checklist: Patient identified, Emergency Drugs available, Suction available and Patient being monitored Patient Re-evaluated:Patient Re-evaluated prior to induction Oxygen Delivery Method: Circle system utilized Preoxygenation: Pre-oxygenation with 100% oxygen Induction Type: IV induction Ventilation: Mask ventilation without difficulty LMA: LMA inserted LMA Size: 4.0 Number of attempts: 1 Airway Equipment and Method: Bite block Placement Confirmation: positive ETCO2 Tube secured with: Tape Dental Injury: Teeth and Oropharynx as per pre-operative assessment

## 2020-12-14 NOTE — Discharge Instructions (Signed)
Transurethral Resection of the Prostate ° °Care After ° °Refer to this sheet in the next few weeks. These discharge instructions provide you with general information on caring for yourself after you leave the hospital. Your caregiver may also give you specific instructions. Your treatment has been planned according to the most current medical practices available, but unavoidable complications sometimes occur. If you have any problems or questions after discharge, please call your caregiver. ° °HOME CARE INSTRUCTIONS  ° °Medications °· You may receive medicine for pain management. As your level of discomfort decreases, adjustments in your pain medicines may be made.  °· Take all medicines as directed.  °· You may be given a medicine (antibiotic) to kill germs following surgery. Finish all medicines. Let your caregiver know if you have any side effects or problems from the medicine.  °· If you are on aspirin, it would be best not to restart the aspirin until the blood in the urine clears °Hygiene °· You can take a shower after surgery.  °· You should not take a bath while you still have the urethral catheter. °Activity °· You will be encouraged to get out of bed as much as possible and increase your activity level as tolerated.  °· Spend the first week in and around your home. For 3 weeks, avoid the following:  °· Straining.  °· Running.  °· Strenuous work.  °· Walks longer than a few blocks.  °· Riding for extended periods.  °· Sexual relations.  °· Do not lift heavy objects (more than 20 pounds) for at least 1 month. When lifting, use your arms instead of your abdominal muscles.  °· You will be encouraged to walk as tolerated. Do not exert yourself. Increase your activity level slowly. Remember that it is important to keep moving after an operation of any type. This cuts down on the possibility of developing blood clots.  °· Your caregiver will tell you when you can resume driving and light housework. Discuss this  at your first office visit after discharge. °Diet °· No special diet is ordered after a TURP. However, if you are on a special diet for another medical problem, it should be continued.  °· Normal fluid intake is usually recommended.  °· Avoid alcohol and caffeinated drinks for 2 weeks. They irritate the bladder. Decaffeinated drinks are okay.  °· Avoid spicy foods.  °Bladder Function °· For the first 10 days, empty the bladder whenever you feel a definite desire. Do not try to hold the urine for long periods of time.  °· Urinating once or twice a night even after you are healed is not uncommon.  °· You may see some recurrence of blood in the urine after discharge from the hospital. This usually happens within 2 weeks after the procedure.If this occurs, force fluids again as you did in the hospital and reduce your activity.  °Bowel Function °· You may experience some constipation after surgery. This can be minimized by increasing fluids and fiber in your diet. Drink enough water and fluids to keep your urine clear or pale yellow.  °· A stool softener may be prescribed for use at home. Do not strain to move your bowels.  °· If you are requiring increased pain medicine, it is important that you take stool softeners to prevent constipation. This will help to promote proper healing by reducing the need to strain to move your bowels.  °Sexual Activity °· Semen movement in the opposite direction and into the bladder (  retrograde ejaculation) may occur. Since the semen passes into the bladder, cloudy urine can occur the first time you urinate after intercourse. Or, you may not have an ejaculation during erection. Ask your caregiver when you can resume sexual activity. Retrograde ejaculation and reduced semen discharge should not reduce one's pleasure of intercourse.  °Postoperative Visit °· Arrange the date and time of your after surgery visit with your caregiver.  °Return to Work °· After your recovery is complete, you will  be able to return to work and resume all activities. Your caregiver will inform you when you can return to work.  °Foley Catheter Care °A soft, flexible tube (Foley catheter) may have been placed in your bladder to drain urine and fluid. Follow these instructions: °Taking Care of the Catheter °· Keep the area where the catheter leaves your body clean.  °· Attach the catheter to the leg so there is no tension on the catheter.  °· Keep the drainage bag below the level of the bladder, but keep it OFF the floor.  °· Do not take long soaking baths. Your caregiver will give instructions about showering.  °· Wash your hands before touching ANYTHING related to the catheter or bag.  °· Using mild soap and warm water on a washcloth:  °· Clean the area closest to the catheter insertion site using a circular motion around the catheter.  °· Clean the catheter itself by wiping AWAY from the insertion site for several inches down the tube.  °· NEVER wipe upward as this could sweep bacteria up into the urethra (tube in your body that normally drains the bladder) and cause infection.  °· Place a small amount of sterile lubricant at the tip of the penis where the catheter is entering.  °Taking Care of the Drainage Bags °· Two drainage bags may be taken home: a large overnight drainage bag, and a smaller leg bag which fits underneath clothing.  °· It is okay to wear the overnight bag at any time, but NEVER wear the smaller leg bag at night.  °· Keep the drainage bag well below the level of your bladder. This prevents backflow of urine into the bladder and allows the urine to drain freely.  °· Anchor the tubing to your leg to prevent pulling or tension on the catheter. Use tape or a leg strap provided by the hospital.  °· Empty the drainage bag when it is 1/2 to 3/4 full. Wash your hands before and after touching the bag.  °· Periodically check the tubing for kinks to make sure there is no pressure on the tubing which could restrict  the flow of urine.  °Changing the Drainage Bags °· Cleanse both ends of the clean bag with alcohol before changing.  °· Pinch off the rubber catheter to avoid urine spillage during the disconnection.  °· Disconnect the dirty bag and connect the clean one.  °· Empty the dirty bag carefully to avoid a urine spill.  °· Attach the new bag to the leg with tape or a leg strap.  °Cleaning the Drainage Bags °· Whenever a drainage bag is disconnected, it must be cleaned quickly so it is ready for the next use.  °· Wash the bag in warm, soapy water.  °· Rinse the bag thoroughly with warm water.  °· Soak the bag for 30 minutes in a solution of white vinegar and water (1 cup vinegar to 1 quart warm water).  °· Rinse with warm water.  °SEEK MEDICAL   CARE IF:  °· You have chills or night sweats.  °· You are leaking around your catheter or have problems with your catheter. It is not uncommon to have sporadic leakage around your catheter as a result of bladder spasms. If the leakage stops, there is not much need for concern. If you are uncertain, call your caregiver.  °· You develop side effects that you think are coming from your medicines.  °SEEK IMMEDIATE MEDICAL CARE IF:  °· You are suddenly unable to urinate. Check to see if there are any kinks in the drainage tubing that may cause this. If you cannot find any kinks, call your caregiver immediately. This is an emergency.  °· You develop shortness of breath or chest pains.  °· Bleeding persists or clots develop in your urine.  °· You have a fever.  °· You develop pain in your back or over your lower belly (abdomen).  °· You develop pain or swelling in your legs.  °· Any problems you are having get worse rather than better.  °MAKE SURE YOU:  °· Understand these instructions.  °· Will watch your condition.  °· Will get help right away if you are not doing well or get worse.  °Document Released: 03/18/2005 Document Revised: 11/28/2010 Document Reviewed: 11/09/2008 °[employer]®  Patient Information ©2012 [employer].Transurethral Resection of the Prostate °Care After °Refer to this sheet in the next few weeks. These discharge instructions provide you with general information on caring for yourself after you leave the hospital. Your caregiver may also give you specific instructions. Your treatment has been planned according to the most current medical practices available, but unavoidable complications sometimes occur. If you have any problems or questions after discharge, please call your caregiver. °

## 2020-12-15 ENCOUNTER — Encounter (HOSPITAL_COMMUNITY): Payer: Self-pay | Admitting: Urology

## 2020-12-15 DIAGNOSIS — N401 Enlarged prostate with lower urinary tract symptoms: Secondary | ICD-10-CM | POA: Diagnosis not present

## 2020-12-15 LAB — SURGICAL PCR SCREEN
MRSA, PCR: NEGATIVE
Staphylococcus aureus: NEGATIVE

## 2020-12-15 LAB — SURGICAL PATHOLOGY

## 2020-12-15 LAB — GLUCOSE, CAPILLARY: Glucose-Capillary: 148 mg/dL — ABNORMAL HIGH (ref 70–99)

## 2020-12-15 MED ORDER — ALUM & MAG HYDROXIDE-SIMETH 200-200-20 MG/5ML PO SUSP
30.0000 mL | Freq: Four times a day (QID) | ORAL | Status: AC | PRN
  Administered 2020-12-15: 30 mL via ORAL
  Filled 2020-12-15: qty 30

## 2020-12-15 MED ORDER — CEPHALEXIN 500 MG PO CAPS: 500.0000 mg | ORAL_CAPSULE | Freq: Two times a day (BID) | ORAL | 0 refills | Status: DC

## 2020-12-15 NOTE — Progress Notes (Signed)
Chaplain assisted patient and his wife to complete and notarize advance directives.   [place], [place] Pager, [phone] 12:37 PM

## 2020-12-15 NOTE — Plan of Care (Signed)
Patient discharged per order 

## 2020-12-18 ENCOUNTER — Emergency Department (HOSPITAL_COMMUNITY): Payer: No Typology Code available for payment source

## 2020-12-18 ENCOUNTER — Encounter (HOSPITAL_COMMUNITY): Payer: Self-pay | Admitting: Emergency Medicine

## 2020-12-18 ENCOUNTER — Inpatient Hospital Stay (HOSPITAL_COMMUNITY)
Admission: EM | Admit: 2020-12-18 | Discharge: 2020-12-26 | DRG: 037 | Disposition: A | Discharge status: Home Health | Payer: No Typology Code available for payment source | Attending: Internal Medicine | Admitting: Internal Medicine

## 2020-12-18 ENCOUNTER — Other Ambulatory Visit: Payer: Self-pay

## 2020-12-18 DIAGNOSIS — E0590 Thyrotoxicosis, unspecified without thyrotoxic crisis or storm: Secondary | ICD-10-CM | POA: Diagnosis present

## 2020-12-18 DIAGNOSIS — G8324 Monoplegia of upper limb affecting left nondominant side: Secondary | ICD-10-CM | POA: Diagnosis present

## 2020-12-18 DIAGNOSIS — Z6823 Body mass index (BMI) 23.0-23.9, adult: Secondary | ICD-10-CM

## 2020-12-18 DIAGNOSIS — Z79899 Other long term (current) drug therapy: Secondary | ICD-10-CM

## 2020-12-18 DIAGNOSIS — R531 Weakness: Secondary | ICD-10-CM | POA: Diagnosis not present

## 2020-12-18 DIAGNOSIS — D649 Anemia, unspecified: Secondary | ICD-10-CM

## 2020-12-18 DIAGNOSIS — E119 Type 2 diabetes mellitus without complications: Secondary | ICD-10-CM

## 2020-12-18 DIAGNOSIS — Z96642 Presence of left artificial hip joint: Secondary | ICD-10-CM | POA: Diagnosis present

## 2020-12-18 DIAGNOSIS — Z66 Do not resuscitate: Secondary | ICD-10-CM | POA: Diagnosis present

## 2020-12-18 DIAGNOSIS — Z20822 Contact with and (suspected) exposure to covid-19: Secondary | ICD-10-CM | POA: Diagnosis present

## 2020-12-18 DIAGNOSIS — N179 Acute kidney failure, unspecified: Secondary | ICD-10-CM | POA: Diagnosis present

## 2020-12-18 DIAGNOSIS — I639 Cerebral infarction, unspecified: Secondary | ICD-10-CM | POA: Diagnosis not present

## 2020-12-18 DIAGNOSIS — L97419 Non-pressure chronic ulcer of right heel and midfoot with unspecified severity: Secondary | ICD-10-CM | POA: Diagnosis present

## 2020-12-18 DIAGNOSIS — H538 Other visual disturbances: Secondary | ICD-10-CM | POA: Diagnosis present

## 2020-12-18 DIAGNOSIS — R319 Hematuria, unspecified: Secondary | ICD-10-CM | POA: Diagnosis present

## 2020-12-18 DIAGNOSIS — F845 Asperger's syndrome: Secondary | ICD-10-CM | POA: Diagnosis present

## 2020-12-18 DIAGNOSIS — E43 Unspecified severe protein-calorie malnutrition: Secondary | ICD-10-CM | POA: Diagnosis present

## 2020-12-18 DIAGNOSIS — I77810 Thoracic aortic ectasia: Secondary | ICD-10-CM | POA: Diagnosis present

## 2020-12-18 DIAGNOSIS — I493 Ventricular premature depolarization: Secondary | ICD-10-CM | POA: Diagnosis present

## 2020-12-18 DIAGNOSIS — R2990 Unspecified symptoms and signs involving the nervous system: Secondary | ICD-10-CM

## 2020-12-18 DIAGNOSIS — Z833 Family history of diabetes mellitus: Secondary | ICD-10-CM

## 2020-12-18 DIAGNOSIS — G43909 Migraine, unspecified, not intractable, without status migrainosus: Secondary | ICD-10-CM | POA: Diagnosis present

## 2020-12-18 DIAGNOSIS — Z794 Long term (current) use of insulin: Secondary | ICD-10-CM

## 2020-12-18 DIAGNOSIS — D62 Acute posthemorrhagic anemia: Secondary | ICD-10-CM | POA: Diagnosis present

## 2020-12-18 DIAGNOSIS — R278 Other lack of coordination: Secondary | ICD-10-CM | POA: Diagnosis present

## 2020-12-18 DIAGNOSIS — I6521 Occlusion and stenosis of right carotid artery: Secondary | ICD-10-CM | POA: Diagnosis present

## 2020-12-18 DIAGNOSIS — E785 Hyperlipidemia, unspecified: Secondary | ICD-10-CM | POA: Diagnosis present

## 2020-12-18 DIAGNOSIS — Z87891 Personal history of nicotine dependence: Secondary | ICD-10-CM

## 2020-12-18 DIAGNOSIS — I63411 Cerebral infarction due to embolism of right middle cerebral artery: Secondary | ICD-10-CM | POA: Diagnosis not present

## 2020-12-18 DIAGNOSIS — D638 Anemia in other chronic diseases classified elsewhere: Secondary | ICD-10-CM | POA: Diagnosis present

## 2020-12-18 DIAGNOSIS — N390 Urinary tract infection, site not specified: Secondary | ICD-10-CM

## 2020-12-18 DIAGNOSIS — I129 Hypertensive chronic kidney disease with stage 1 through stage 4 chronic kidney disease, or unspecified chronic kidney disease: Secondary | ICD-10-CM | POA: Diagnosis present

## 2020-12-18 DIAGNOSIS — R29701 NIHSS score 1: Secondary | ICD-10-CM | POA: Diagnosis present

## 2020-12-18 DIAGNOSIS — I10 Essential (primary) hypertension: Secondary | ICD-10-CM | POA: Diagnosis present

## 2020-12-18 DIAGNOSIS — Z9079 Acquired absence of other genital organ(s): Secondary | ICD-10-CM

## 2020-12-18 DIAGNOSIS — E1151 Type 2 diabetes mellitus with diabetic peripheral angiopathy without gangrene: Secondary | ICD-10-CM | POA: Diagnosis present

## 2020-12-18 DIAGNOSIS — I350 Nonrheumatic aortic (valve) stenosis: Secondary | ICD-10-CM | POA: Diagnosis present

## 2020-12-18 DIAGNOSIS — I2510 Atherosclerotic heart disease of native coronary artery without angina pectoris: Secondary | ICD-10-CM | POA: Diagnosis present

## 2020-12-18 DIAGNOSIS — N184 Chronic kidney disease, stage 4 (severe): Secondary | ICD-10-CM | POA: Diagnosis present

## 2020-12-18 DIAGNOSIS — R471 Dysarthria and anarthria: Secondary | ICD-10-CM | POA: Diagnosis present

## 2020-12-18 DIAGNOSIS — R1310 Dysphagia, unspecified: Secondary | ICD-10-CM

## 2020-12-18 DIAGNOSIS — E039 Hypothyroidism, unspecified: Secondary | ICD-10-CM | POA: Diagnosis present

## 2020-12-18 DIAGNOSIS — Z931 Gastrostomy status: Secondary | ICD-10-CM

## 2020-12-18 LAB — COMPREHENSIVE METABOLIC PANEL
ALT: 8 U/L (ref 0–44)
AST: 13 U/L — ABNORMAL LOW (ref 15–41)
Albumin: 3 g/dL — ABNORMAL LOW (ref 3.5–5.0)
Alkaline Phosphatase: 83 U/L (ref 38–126)
Anion gap: 7 (ref 5–15)
BUN: 43 mg/dL — ABNORMAL HIGH (ref 8–23)
CO2: 21 mmol/L — ABNORMAL LOW (ref 22–32)
Calcium: 8.4 mg/dL — ABNORMAL LOW (ref 8.9–10.3)
Chloride: 108 mmol/L (ref 98–111)
Creatinine, Ser: 2.45 mg/dL — ABNORMAL HIGH (ref 0.61–1.24)
GFR, Estimated: 25 mL/min — ABNORMAL LOW (ref >60)
Glucose, Bld: 160 mg/dL — ABNORMAL HIGH (ref 70–99)
Potassium: 4.9 mmol/L (ref 3.5–5.1)
Sodium: 136 mmol/L (ref 135–145)
Total Bilirubin: 0.1 mg/dL — ABNORMAL LOW (ref 0.3–1.2)
Total Protein: 6.9 g/dL (ref 6.5–8.1)

## 2020-12-18 LAB — I-STAT CHEM 8, ED
BUN: 42 mg/dL — ABNORMAL HIGH (ref 8–23)
Calcium, Ion: 1.18 mmol/L (ref 1.15–1.40)
Chloride: 109 mmol/L (ref 98–111)
Creatinine, Ser: 2.5 mg/dL — ABNORMAL HIGH (ref 0.61–1.24)
Glucose, Bld: 159 mg/dL — ABNORMAL HIGH (ref 70–99)
HCT: 21 % — ABNORMAL LOW (ref 39.0–52.0)
Hemoglobin: 7.1 g/dL — ABNORMAL LOW (ref 13.0–17.0)
Potassium: 4.6 mmol/L (ref 3.5–5.1)
Sodium: 138 mmol/L (ref 135–145)
TCO2: 20 mmol/L — ABNORMAL LOW (ref 22–32)

## 2020-12-18 LAB — RESP PANEL BY RT-PCR (FLU A&B, COVID) ARPGX2
Influenza A by PCR: NEGATIVE
Influenza B by PCR: NEGATIVE
SARS Coronavirus 2 by RT PCR: NEGATIVE

## 2020-12-18 LAB — DIFFERENTIAL
Abs Immature Granulocytes: 0.1 10*3/uL — ABNORMAL HIGH (ref 0.00–0.07)
Basophils Absolute: 0 10*3/uL (ref 0.0–0.1)
Basophils Relative: 0 %
Eosinophils Absolute: 0.1 10*3/uL (ref 0.0–0.5)
Eosinophils Relative: 1 %
Immature Granulocytes: 1 %
Lymphocytes Relative: 8 %
Lymphs Abs: 1 10*3/uL (ref 0.7–4.0)
Monocytes Absolute: 1.1 10*3/uL — ABNORMAL HIGH (ref 0.1–1.0)
Monocytes Relative: 10 %
Neutro Abs: 9.1 10*3/uL — ABNORMAL HIGH (ref 1.7–7.7)
Neutrophils Relative %: 80 %

## 2020-12-18 LAB — APTT: aPTT: 35 seconds (ref 24–36)

## 2020-12-18 LAB — RAPID URINE DRUG SCREEN, HOSP PERFORMED
Amphetamines: NOT DETECTED
Barbiturates: NOT DETECTED
Benzodiazepines: NOT DETECTED
Cocaine: NOT DETECTED
Opiates: POSITIVE — AB
Tetrahydrocannabinol: NOT DETECTED

## 2020-12-18 LAB — URINALYSIS, MICROSCOPIC (REFLEX): RBC / HPF: >50 RBC/hpf (ref 0–5)

## 2020-12-18 LAB — CBC
HCT: 23.4 % — ABNORMAL LOW (ref 39.0–52.0)
Hemoglobin: 7.2 g/dL — ABNORMAL LOW (ref 13.0–17.0)
MCH: 27.8 pg (ref 26.0–34.0)
MCHC: 30.8 g/dL (ref 30.0–36.0)
MCV: 90.3 fL (ref 80.0–100.0)
Platelets: 380 10*3/uL (ref 150–400)
RBC: 2.59 MIL/uL — ABNORMAL LOW (ref 4.22–5.81)
RDW: 14.1 % (ref 11.5–15.5)
WBC: 11.4 10*3/uL — ABNORMAL HIGH (ref 4.0–10.5)
nRBC: 0 % (ref 0.0–0.2)

## 2020-12-18 LAB — URINALYSIS, ROUTINE W REFLEX MICROSCOPIC
Bilirubin Urine: NEGATIVE
Glucose, UA: NEGATIVE mg/dL
Ketones, ur: NEGATIVE mg/dL
Nitrite: NEGATIVE
Protein, ur: >300 mg/dL — AB
Specific Gravity, Urine: 1.025 (ref 1.005–1.030)
pH: 5.5 (ref 5.0–8.0)

## 2020-12-18 LAB — PROTIME-INR
INR: 1.2 (ref 0.8–1.2)
Prothrombin Time: 15 seconds (ref 11.4–15.2)

## 2020-12-18 LAB — ETHANOL: Alcohol, Ethyl (B): <10 mg/dL (ref <10)

## 2020-12-18 MED ORDER — PANTOPRAZOLE SODIUM 40 MG IV SOLR
40.0000 mg | Freq: Once | INTRAVENOUS | Status: AC
  Administered 2020-12-18: 40 mg via INTRAVENOUS
  Filled 2020-12-18: qty 40

## 2020-12-18 MED ORDER — SODIUM CHLORIDE 0.9 % IV SOLN
1.0000 g | Freq: Once | INTRAVENOUS | Status: AC
  Administered 2020-12-19: 1 g via INTRAVENOUS
  Filled 2020-12-18: qty 10

## 2020-12-18 NOTE — ED Notes (Signed)
Patient transported to MRI 

## 2020-12-18 NOTE — ED Notes (Signed)
EDP at bedside  

## 2020-12-18 NOTE — Consult Note (Addendum)
Neurology Consultation Reason for Consult: Strokes on MRI Requesting Physician: Jennette Kettle  CC: Left eye vision changes and transient left upper extremity weakness/numbness  History is obtained from: Patient, chart review and ophthalmology notes provided by family  HPI: John Walton is a 87 y.o. male with a past medical history significant for hypertension, hyperlipidemia, type 2 diabetes, known right carotid stenosis (60 to 79%) being managed conservatively, hypothyroidism, migraine headaches, coronary artery disease, aortic stenosis, peripheral vascular disease, chronic kidney disease, alcoholism in remission, remote tobacco use (quit 1970s), monoclonal gammopathy.  He recently had a TURP on 9/15, and had been doing well postoperatively but has had some ongoing hematuria.  Otherwise denies signs or symptoms of bleeding, denies dizziness or lightheadedness.  He did have some vision changes which were evaluated by ophthalmology and for which he was appropriately sent to the ED for concern for stroke.   On review of systems he additionally notes increased swelling in his feet for the last 1 to 2 months, stable 3 pillow orthopnea.  At baseline he walks with a walker, and does not drive as his wife manages driving.  His wife notes that she has had concerns about declining memory over the past year or so  Per vascular surgery notes he is on a statin but refuses to take aspirin. Regarding use of aspirin, he notes that that this is a very dangerous/deadly drug and then discusses that he used to take 25 pills of aspirin daily for severe migraine headaches until he invented an herbal formulation applied at the wrist which is FDA approved for migraine treatment at the headache center in [place].  He is willing to take 81 mg daily  LKW: 9/18 tPA given?: No, due to out of the window and ongoing hematuria after TURP with anemia Premorbid modified rankin scale:      1 - No significant disability.  Able to carry out all usual activities, despite some symptoms.     2 - Slight disability. Able to look after own affairs without assistance, but unable to carry out all previous activities.  ROS: All other review of systems was negative except as noted in the HPI.   Past Medical History:  Diagnosis Date   Anemia    Chicken pox    Chronic kidney disease    Coronary artery disease    Diabetes mellitus without complication (HCC)    Frequent headaches    Glaucoma    Hay fever    History of blood transfusion    HTN (hypertension)    Hyperlipidemia    Hyperlipidemia    Hypothyroidism    Migraines    Peripheral vascular disease ([place])    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([place])   -Known right carotid stenosis  Past Surgical History:  Procedure Laterality Date   CHOLECYSTECTOMY, LAPAROSCOPIC     IR GASTROSTOMY TUBE MOD SED  03/29/2020   TONSILLECTOMY  04/01/1940   TOTAL HIP ARTHROPLASTY Left 03/08/2020   Procedure: TOTAL HIP ARTHROPLASTY;  Surgeon: Marybelle Killings, MD;  Location: [facility];  Service: Orthopedics;  Laterality: Left;   TRANSURETHRAL RESECTION OF PROSTATE N/A 12/14/2020   Procedure: TRANSURETHRAL RESECTION OF THE PROSTATE (TURP);  Surgeon: Franchot Gallo, MD;  Location: [facility];  Service: Urology;  Laterality: N/A;  2 HRS     Current Facility-Administered Medications:     stroke: mapping our early stages of recovery book, , Does not apply, Once, Alcario Drought, Jared M, DO   acetaminophen (TYLENOL) tablet 650  mg, 650 mg, Oral, Q4H PRN **OR** acetaminophen (TYLENOL) 160 MG/5ML solution 650 mg, 650 mg, Per Tube, Q4H PRN **OR** acetaminophen (TYLENOL) suppository 650 mg, 650 mg, Rectal, Q4H PRN, Alcario Drought, Jared M, DO   atorvastatin (LIPITOR) tablet 40 mg, 40 mg, Oral, QHS, Gardner, Jared M, DO, 40 mg at 12/19/20 0120   brinzolamide (AZOPT) 1 % ophthalmic suspension 1 drop, 1 drop, Both Eyes, BID **AND** brimonidine (ALPHAGAN) 0.2 % ophthalmic solution 1 drop, 1 drop, Both Eyes, BID,  Alcario Drought, Jared M, DO   calcium-vitamin D (OSCAL WITH D) 500-200 MG-UNIT per tablet 1 tablet, 1 tablet, Oral, TID with meals, Alcario Drought, Jared M, DO   carboxymethylcellulose (REFRESH PLUS) 0.5 % ophthalmic solution 1 drop, 1 drop, Both Eyes, TID, Alcario Drought, Jared M, DO   collagenase (SANTYL) ointment 1 application, 1 application, Topical, QODAY, Gardner, Jared M, DO   feeding supplement (PROSource TF) liquid 45 mL, 45 mL, Per Tube, BID, Alcario Drought, Jared M, DO, 45 mL at 12/19/20 0120   free water 120 mL, 120 mL, Per Tube, BID, Alcario Drought, Jared M, DO, 120 mL at 12/19/20 0120   HYDROcodone-acetaminophen (NORCO/VICODIN) 5-325 MG per tablet 1 tablet, 1 tablet, Oral, Q6H PRN, Etta Quill, DO, 1 tablet at 12/19/20 0225   latanoprost (XALATAN) 0.005 % ophthalmic solution 1 drop, 1 drop, Both Eyes, QHS, Gardner, Jared M, DO   Lifitegrast 5 % SOLN 1 drop, 1 drop, Both Eyes, BID, Alcario Drought, Jared M, DO   methimazole (TAPAZOLE) tablet 10 mg, 10 mg, Oral, q AM, Alcario Drought, Jared M, DO   sucralfate (CARAFATE) 1 GM/10ML suspension 1 g, 1 g, Oral, TID AC, Gardner, Jared M, DO   Tears Again OINT 1 application, 1 application, Both Eyes, BID, Alcario Drought, Jared M, DO  Current Outpatient Medications:    atorvastatin (LIPITOR) 40 MG tablet, Place 1 tablet (40 mg total) into feeding tube daily. (Patient taking differently: Take 40 mg by mouth at bedtime.), Disp: 30 tablet, Rfl: 0   calcium-vitamin D (OSCAL WITH D) 500-200 MG-UNIT tablet, Place 1 tablet into feeding tube with breakfast, with lunch, and with evening meal. (Patient taking differently: Take 1 tablet by mouth with breakfast, with lunch, and with evening meal.), Disp: 90 tablet, Rfl: 0   carboxymethylcellulose (REFRESH PLUS) 0.5 % SOLN, Place 1 drop into both eyes in the morning, at noon, and at bedtime., Disp: , Rfl:    collagenase (SANTYL) ointment, Apply 1 application topically every other day. APPLIED TO HEEL WOUND, Disp: , Rfl:    HYDROcodone-Acetaminophen (NORCO  PO), Take 1 tablet by mouth every 6 (six) hours as needed (pain)., Disp: , Rfl:    latanoprost (XALATAN) 0.005 % ophthalmic solution, Place 1 drop into both eyes at bedtime. , Disp: , Rfl:    Lifitegrast (XIIDRA) 5 % SOLN, Place 1 drop into both eyes in the morning and at bedtime., Disp: , Rfl:    methimazole (TAPAZOLE) 10 MG tablet, Take 10 mg by mouth in the morning., Disp: , Rfl:    Nutritional Supplements (PROSOURCE) LIQD, Give 30 mLs by tube in the morning and at bedtime., Disp: , Rfl:    SIMBRINZA 1-0.2 % SUSP, Place 1 drop into both eyes in the morning and at bedtime. , Disp: , Rfl:    sucralfate (CARAFATE) 1 GM/10ML suspension, Take 10 mLs (1 g total) by mouth 4 (four) times daily -  with meals and at bedtime. (Patient taking differently: Take 1 g by mouth 3 (three) times daily before meals.), Disp: 420 mL,  Rfl: 0   Water For Irrigation, Sterile (FREE WATER) SOLN, Place 120 mLs into feeding tube 5 (five) times daily. (Patient taking differently: Place 120 mLs into feeding tube in the morning and at bedtime.), Disp: 1000 mL, Rfl: 15   White Petrolatum-Mineral Oil (TEARS AGAIN) OINT, Place 1 application into both eyes in the morning and at bedtime., Disp: , Rfl:    Insulin Pen Needle 29G X 12.7MM MISC, Use as directed, Disp: 100 each, Rfl: 0  Family History  Problem Relation Age of Onset   Liver disease Mother    Skin cancer Father    Skin cancer Paternal Grandfather    Diabetes Maternal Grandfather    Social History:  reports that he quit smoking about 43 years ago. His smoking use included cigarettes. He started smoking about 52 years ago. He has never used smokeless tobacco. He reports that he does not currently use alcohol. He reports that he does not use drugs.   Exam: Current vital signs: BP 124/66 (BP Location: Left Arm)   Pulse (!) 102   Temp 97.8 F (36.6 C) (Oral)   Resp 12   Ht 5\' 6"  (1.676 m)   Wt 65 kg   SpO2 98%   BMI 23.13 kg/m  Vital signs in last 24  hours: Temp:  [97.8 F (36.6 C)] 97.8 F (36.6 C) (09/19 1916) Pulse Rate:  [98-102] 102 (09/19 2158) Resp:  [12-18] 12 (09/19 2158) BP: (124)/(66) 124/66 (09/19 1916) SpO2:  [98 %-100 %] 98 % (09/19 2158) Weight:  [65 kg] 65 kg (09/19 1914)   Physical Exam  Constitutional: Appears well-developed and well-nourished.  Psych: Exam made more challenging by frequent tangential answers or deliberately incorrect answers which wife reports is his baseline humor/personality Eyes: No scleral injection HENT: No oropharyngeal obstruction.  MSK: no joint deformities.  Cardiovascular: Normal rate and regular rhythm.  Respiratory: Effort normal, non-labored breathing GI: Soft.  No distension. There is no tenderness.  Skin: Warm dry and intact visible skin  Neuro: Mental Status: Patient is awake, alert, oriented to person, place, month, year, and situation. Patient's history is often tangential and at times slightly inconsistent, No signs of aphasia or neglect Cranial Nerves: II: Visual Fields are full in the right eye but in the left eye he does not report either of the lower quadrants accurately, although he verbally reports that he feels his vision is returned back to normal. Pupils are round and reactive bilaterally however his left eye does have a subtle APD III,IV, VI: EOMI without ptosis or diploplia.  V: Facial sensation is symmetric to temperature VII: Facial movement is symmetric.  VIII: hearing is intact to voice X: Uvula elevates symmetrically XI: Shoulder shrug is symmetric. XII: tongue is midline without atrophy or fasciculations.  Motor: Tone is normal. Bulk is normal. 5/5 strength was present in all four extremities, other than bilateral deltoid weakness that he reports is secondary to arthritis, 4/5 Sensory: Sensation is symmetric to light touch and temperature in the arms and legs. Deep Tendon Reflexes Hypoactive throughout Cerebellar: FNF and HKS are intact  bilaterally  NIHSS total 0   I have reviewed labs in epic and the results pertinent to this consultation are:   Basic Metabolic Panel: Recent Labs  Lab 12/14/20 1100 12/18/20 1944 12/18/20 1952  NA 134* 136 138  K 4.2 4.9 4.6  CL 108 108 109  CO2 21* 21*  --   GLUCOSE 112* 160* 159*  BUN 39* 43* 42*  CREATININE  2.39* 2.45* 2.50*  CALCIUM 8.6* 8.4*  --     CBC: Recent Labs  Lab 12/18/20 1944 12/18/20 1952  WBC 11.4*  --   NEUTROABS 9.1*  --   HGB 7.2* 7.1*  HCT 23.4* 21.0*  MCV 90.3  --   PLT 380  --     Coagulation Studies: Recent Labs    12/18/20 1944  LABPROT 15.0  INR 1.2      Lab Results  Component Value Date   CHOL 160 03/10/2020   HDL 37 (L) 03/10/2020   LDLCALC 95 03/10/2020   LDLDIRECT 137.0 07/11/2014   TRIG 141 03/10/2020   CHOLHDL 4.3 03/10/2020   Lab Results  Component Value Date   HGBA1C 7.1 (H) 11/21/2020    Lab Results  Component Value Date   TSH 0.278 (L) 03/07/2020    I have reviewed the images obtained:  ECHO 03/08/20 1. Left ventricular ejection fraction, by estimation, is 55 to 60%. The left ventricle has normal function. The left ventricle has no regional  wall motion abnormalities. There is moderate left ventricular hypertrophy.  Left ventricular diastolic parameters are indeterminate.   2. Right ventricular systolic function is normal. The right ventricular size is normal. Tricuspid regurgitation signal is inadequate for assessing PA pressure.   3. The mitral valve is normal in structure. No evidence of mitral valve regurgitation.   4. Aortic dilatation noted. There is mild dilatation of the aortic root, measuring 40 mm.   5. The inferior vena cava is normal in size with greater than 50% respiratory variability, suggesting right atrial pressure of 3 mmHg.   6. The aortic valve is calcified. There is moderate calcification of the aortic valve. Aortic valve regurgitation is not visualized. Mild to moderate aortic valve  stenosis. Vmax 2.2 m/s, MG 12 mmHg, AVA 1.5 cm^2, DI 0.33   09/27/2020 Right Carotid: Velocities in the right ICA are consistent with a 60-79% stenosis.  Left Carotid: Velocities in the left ICA are consistent with a 1-39% stenosis.  Vertebrals:  Bilateral vertebral arteries demonstrate antegrade flow.  Subclavians: Normal flow hemodynamics were seen in bilateral subclavian arteries.   MRI brain personally reviewed, with scattered infarcts in the right MCA watershed distribution, with notable asymmetry of chronic ischemic white matter changes, right greater than left  Formal radiology read: 1. Patchy multifocal acute ischemic infarcts involving the cortical gray matter of the right frontal, parietal, and occipital lobes as above. No associated hemorrhage or mass effect. 2. 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 3. Underlying moderate chronic microvascular ischemic disease.  Recommendations: Acute right MCA territory scattered embolic stroke, favor atheroembolic given history though cannot rule out cardioembolic at this time.  Additionally considered that etiology may be hypoperfusion in the setting of acute anemia after TURP  #Acute right MCA territory scattered embolic stroke, favor atheroembolic given history though cannot rule out cardioembolic at this time - Stroke labs HgbA1c, fasting lipid panel - MRI brain completed as above - MRA head  - Carotid ultrasound to confirm no rapid progression of his stenosis documented in June - Frequent neuro checks - Echocardiogram - Vascular surgery evaluation for urgent right carotid endarterectomy, though may need to be delayed in the setting of his recent TURP - Hold antiplatelet agents for now given acute anemia, receiving blood products, please consult urology for safe timing of starting DAPT or at least monotherapy - If LDL results above 70, please escalate statin to 80 mg daily - Risk factor  modification -  Telemetry monitoring - Blood pressure goal   - Permissive hypertension to 220/120 due to acute stroke w/ known cartoid stenosis -Agree with blood transfusion in the setting of acute anemia - PT consult, OT consult, Speech consult, unless patient is back to baseline - Stroke team to follow  [place] [phone] Available 7 PM to 7 AM, outside of these hours please call Neurologist on call as listed on Amion.

## 2020-12-18 NOTE — ED Triage Notes (Signed)
Pt sent here by eye dr for brain MRI. Per pt around 1000 he noticed his speech wasn't clear, and he had some numbness in the L hand. Symptoms have resolved, denies pain. Pt has no unilateral weakness.

## 2020-12-18 NOTE — ED Provider Notes (Signed)
[name] EMERGENCY DEPARTMENT Provider Note   CSN: [id number] Arrival date & time: 12/18/20  1651     History Chief Complaint  Patient presents with   Neurologic Problem    John Walton is a 87 y.o. male.  87 yo male with history of HTN, HLD, DM, CAD, CKD, presents to ED with complaint of left sided vision changes/blurry vision, left hand sensation reduction/weakness and dysarthria. Symptom onset around 0900 this morning. He went to his eye doctor who promptly sent the patient to ED for stroke evaluation. Patient reports throughout the day his symptoms gradually improved and is now nearly resolved. He is acting at baseline per spouse at bedside and is very hard of hearing. He has some weakness to his LUE but vision changes and dysarthria have resolved. No reported trauma, no recent medication changes, no headaches, no chest pain or dyspnea. He is tolerating oral intake appropriatly and compliant with home medications. No change to bowel/bladder function. He uses a walker at baseline and after having a hip fx last year.   The history is provided by the patient. No language interpreter was used.  Neurologic Problem This is a new problem. The current episode started 12 to 24 hours ago. The problem has been gradually improving. Pertinent negatives include no chest pain, no abdominal pain, no headaches and no shortness of breath.      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[facility])     Patient Active Problem List   Diagnosis Date Noted   BPH with obstruction/lower urinary tract symptoms  12/14/2020   Hypocalcemia    Protein-calorie malnutrition, severe 03/29/2020   Hypokalemia 03/29/2020   Hypernatremia 03/29/2020   DM (diabetes mellitus), type 2 ([place]) 03/29/2020   Fever 03/29/2020   Debility 03/29/2020   Dysphagia 03/26/2020   Bilateral hydronephrosis 03/08/2020   Unilateral primary osteoarthritis, left hip    Femoral neck fracture ([place]) 03/07/2020   CKD (chronic kidney disease), stage IV ([place]) 03/07/2020   Normocytic anemia 03/07/2020   BPH (benign prostatic hyperplasia) 03/07/2020   Abdominal mass 03/07/2020   Scalp hematoma 03/07/2020   Hypertensive urgency 03/07/2020   Pain due to onychomycosis of toenails of both feet 08/13/2019   RBBB 09/02/2014   Screening, ischemic heart disease 07/11/2014   Medicare annual wellness visit, subsequent 07/11/2014   Prostate cancer screening 07/11/2014   Essential hypertension 01/11/2014   Hyperlipidemia 01/11/2014    Past Surgical History:  Procedure Laterality Date   CHOLECYSTECTOMY, LAPAROSCOPIC     IR GASTROSTOMY TUBE MOD SED  03/29/2020   TONSILLECTOMY  04/01/1940   TOTAL HIP ARTHROPLASTY Left 03/08/2020   Procedure: TOTAL HIP ARTHROPLASTY;  Surgeon: Marybelle Killings, MD;  Location: [facility] ORS;  Service: Orthopedics;  Laterality: Left;   TRANSURETHRAL RESECTION OF PROSTATE N/A 12/14/2020   Procedure: TRANSURETHRAL RESECTION OF THE PROSTATE (TURP);  Surgeon: Franchot Gallo, MD;  Location: [facility];  Service: Urology;  Laterality: N/A;  2 HRS       Family History  Problem Relation Age of Onset   Liver disease Mother    Skin cancer Father    Skin cancer Paternal Grandfather    Diabetes Maternal Grandfather     Social History   Tobacco Use   Smoking status: Former    Types: Cigarettes    Start date: 04/01/1968    Quit date: 01/11/1977    Years since quitting: 43.9   Smokeless tobacco: Never  Vaping Use   Vaping Use: Never used  Substance Use Topics   Alcohol use: Not Currently   Drug use: No    Home  Medications Prior to Admission medications   Medication Sig Start Date End Date Taking? Authorizing Provider  atorvastatin (LIPITOR) 40 MG tablet Place 1 tablet (40 mg total) into feeding tube daily. Patient taking differently: Take 40 mg by mouth at bedtime. 04/03/20   Aline August, MD  calcium-vitamin D (OSCAL WITH D) 500-200 MG-UNIT tablet Place 1 tablet into feeding tube with breakfast, with lunch, and with evening meal. Patient taking differently: Take 1 tablet by mouth with breakfast, with lunch, and with evening meal. 04/03/20 11/17/21  Aline August, MD  carboxymethylcellulose (REFRESH PLUS) 0.5 % SOLN Place 1 drop into both eyes in the morning, at noon, and at bedtime.    [provider]  cephALEXin (KEFLEX) 500 MG capsule Take 1 capsule (500 mg total) by mouth 2 (two) times daily for 6 doses. 12/15/20 12/18/20  Franchot Gallo, MD  collagenase (SANTYL) ointment Apply 1 application topically every other day. APPLIED TO HEEL WOUND    [provider]  Insulin Pen Needle 29G X 12.7MM MISC Use as directed 03/13/20   Allie Bossier, MD  latanoprost (XALATAN) 0.005 % ophthalmic solution Place 1 drop into both eyes at bedtime.  03/13/19   [provider]  Lifitegrast Shirley Friar) 5 % SOLN Place 1 drop into both eyes in the morning and at bedtime.    [provider]  methimazole (TAPAZOLE) 10 MG tablet Take 10 mg by mouth in the morning.    [provider]  Nutritional Supplements (FEEDING SUPPLEMENT, OSMOLITE 1.5 CAL,) LIQD Place 237 mLs into feeding tube 5 (five) times daily. Patient not taking: No sig reported 04/03/20   Aline August, MD  Nutritional Supplements (PROSOURCE) LIQD Give 30 mLs by tube in the morning and at bedtime.    [provider]  SIMBRINZA 1-0.2 % SUSP Place 1 drop into both eyes in the morning and at bedtime.  05/16/19   [provider]  sucralfate (CARAFATE) 1 GM/10ML suspension Take 10 mLs (1 g total) by mouth 4  (four) times daily -  with meals and at bedtime. Patient taking differently: Take 1 g by mouth 3 (three) times daily before meals. 04/03/20   Aline August, MD  Water For Irrigation, Sterile (FREE WATER) SOLN Place 120 mLs into feeding tube 5 (five) times daily. Patient taking differently: Place 120 mLs into feeding tube in the morning and at bedtime. 04/03/20   Aline August, MD  White Petrolatum-Mineral Oil (TEARS AGAIN) OINT Place 1 application into both eyes in the morning and at bedtime.    [provider]    Allergies    Robitussin cold cough+ chest [dextromethorphan-guaifenesin]  Review of Systems   Review of Systems  Constitutional:  Negative for chills and fever.  HENT:  Negative for facial swelling and trouble swallowing.   Eyes:  Positive for visual disturbance. Negative for photophobia.  Respiratory:  Negative for cough and shortness of  breath.   Cardiovascular:  Negative for chest pain and palpitations.  Gastrointestinal:  Negative for abdominal pain, nausea and vomiting.  Endocrine: Negative for polydipsia and polyuria.  Genitourinary:  Negative for difficulty urinating and hematuria.  Musculoskeletal:  Negative for gait problem and joint swelling.  Skin:  Negative for pallor and rash.  Neurological:  Positive for weakness and numbness. Negative for syncope and headaches.  Psychiatric/Behavioral:  Negative for agitation and confusion.    Physical Exam Updated Vital Signs BP 136/65   Pulse 94   Temp 97.8 F (36.6 C) (Oral)   Resp 10   Ht 5\' 6"  (1.676 m)   Wt 65 kg   SpO2 98%   BMI 23.13 kg/m   Physical Exam Vitals and nursing note reviewed.  Constitutional:      General: He is not in acute distress.    Appearance: He is well-developed.  HENT:     Head: Normocephalic and atraumatic.     Right Ear: External ear normal.     Left Ear: External ear normal.     Mouth/Throat:     Mouth: Mucous membranes are moist.  Eyes:     General: No scleral  icterus.    Extraocular Movements: Extraocular movements intact.     Pupils: Pupils are equal, round, and reactive to light.  Cardiovascular:     Rate and Rhythm: Normal rate and regular rhythm.     Pulses: Normal pulses.     Heart sounds: Normal heart sounds.  Pulmonary:     Effort: Pulmonary effort is normal. No respiratory distress.     Breath sounds: Normal breath sounds.  Abdominal:     General: Abdomen is flat.     Palpations: Abdomen is soft.     Tenderness: There is no abdominal tenderness.  Musculoskeletal:        General: Normal range of motion.     Cervical back: Full passive range of motion without pain and normal range of motion.     Right lower leg: No edema.     Left lower leg: No edema.  Skin:    General: Skin is warm and dry.     Capillary Refill: Capillary refill takes less than 2 seconds.  Neurological:     Mental Status: He is alert and oriented to person, place, and time.     GCS: GCS eye subscore is 4. GCS verbal subscore is 5. GCS motor subscore is 6.     Cranial Nerves: Cranial nerves are intact.     Sensory: Sensation is intact.     Motor: Weakness present.     Coordination: Coordination is intact.     Gait: Gait is intact.     Comments: 4/5 strength LUE, 5/5 strength RUE  Psychiatric:        Mood and Affect: Mood normal.        Behavior: Behavior normal.    ED Results / Procedures / Treatments   Labs (all labs ordered are listed, but only abnormal results are displayed) Labs Reviewed  CBC - Abnormal; Notable for the following components:      Result Value   WBC 11.4 (*)    RBC 2.59 (*)    Hemoglobin 7.2 (*)    HCT 23.4 (*)    All other components within normal limits  DIFFERENTIAL - Abnormal; Notable for the following components:   Neutro Abs 9.1 (*)    Monocytes Absolute 1.1 (*)    Abs Immature Granulocytes 0.10 (*)  All other components within normal limits  COMPREHENSIVE METABOLIC PANEL - Abnormal; Notable for the following  components:   CO2 21 (*)    Glucose, Bld 160 (*)    BUN 43 (*)    Creatinine, Ser 2.45 (*)    Calcium 8.4 (*)    Albumin 3.0 (*)    AST 13 (*)    Total Bilirubin 0.1 (*)    GFR, Estimated 25 (*)    All other components within normal limits  RAPID URINE DRUG SCREEN, HOSP PERFORMED - Abnormal; Notable for the following components:   Opiates POSITIVE (*)    All other components within normal limits  URINALYSIS, ROUTINE W REFLEX MICROSCOPIC - Abnormal; Notable for the following components:   APPearance TURBID (*)    Hgb urine dipstick LARGE (*)    Protein, ur >300 (*)    Leukocytes,Ua SMALL (*)    All other components within normal limits  URINALYSIS, MICROSCOPIC (REFLEX) - Abnormal; Notable for the following components:   Bacteria, UA FEW (*)    All other components within normal limits  I-STAT CHEM 8, ED - Abnormal; Notable for the following components:   BUN 42 (*)    Creatinine, Ser 2.50 (*)    Glucose, Bld 159 (*)    TCO2 20 (*)    Hemoglobin 7.1 (*)    HCT 21.0 (*)    All other components within normal limits  RESP PANEL BY RT-PCR (FLU A&B, COVID) ARPGX2  URINE CULTURE  ETHANOL  PROTIME-INR  APTT    EKG EKG Interpretation  Date/Time:  Monday December 18 2020 19:39:32 EDT Ventricular Rate:  93 PR Interval:  186 QRS Duration: 134 QT Interval:  374 QTC Calculation: 465 R Axis:   -82 Text Interpretation: Normal sinus rhythm Right bundle branch block Similar to prior tracing Confirmed by Wynona Dove ([id number]) on 12/18/2020 11:37:15 PM  Radiology CT HEAD WO CONTRAST  Result Date: 12/18/2020 CLINICAL DATA:  TIA. EXAM: CT HEAD WITHOUT CONTRAST TECHNIQUE: Contiguous axial images were obtained from the base of the skull through the vertex without intravenous contrast. COMPARISON:  CT head 03/07/2020. FINDINGS: Brain: No evidence of acute infarction, hemorrhage, hydrocephalus, extra-axial collection or mass lesion/mass effect. There is stable mild diffuse atrophy. There is  also stable mild periventricular white matter hypodensity, likely chronic small vessel ischemic change. 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 Vascular: Atherosclerotic calcifications are present within the cavernous internal carotid arteries. Skull: Normal. Negative for fracture or focal lesion. Sinuses/Orbits: No acute finding. Other: None. IMPRESSION: No acute intracranial abnormality. Electronically Signed   By: Ronney Asters M.D.   On: 12/18/2020 21:12    Procedures Procedures   Medications Ordered in ED Medications  cefTRIAXone (ROCEPHIN) 1 g in sodium chloride 0.9 % 100 mL IVPB (has no administration in time range)  pantoprazole (PROTONIX) injection 40 mg (40 mg Intravenous Given 12/18/20 2201)    ED Course  I have reviewed the triage vital signs and the nursing notes.  Pertinent labs & imaging results that were available during my care of the patient were reviewed by me and considered in my medical decision making (see chart for details).    MDM Rules/Calculators/A&P                           This patient complains of vision changes, left arm weakness/sensation change; this involves an extensive number of treatment options and  is a complaint that carries with it a high risk of complications and morbidity. Vital signs reviewed and are stable. Serious etiologies considered.  NIHSS 1, not tPA/TNK candidate given duration of symptoms. Mild strength reduction/drift to LUE compared to RUE. He has chronic weakness to LLE.   Labs reviewed. Cr similar to baseline. Hgb is mildly reduced but similar to his baseline (around 8-9). Recommend serial H/h. No black stool or BRBPR. Normocytic anemia. Hemodynamically stable.    UA is concerning for possible UTI. Urine culture sent. Will start on abx.   CTH non-acute. MRI has not yet resulted but on initial evaluation is concerning for possible acute infarct.   At this time patient signed out to  incoming team pending MRI result and final disposition.      Final Clinical Impression(s) / ED Diagnoses Final diagnoses:  Urinary tract infection with hematuria, site unspecified  Weakness  Stroke-like symptoms  Anemia, unspecified type    Rx / DC Orders ED Discharge Orders     None        Jeanell Sparrow, DO 12/18/20 2350

## 2020-12-18 NOTE — ED Provider Notes (Signed)
Emergency Medicine Provider Triage Evaluation Note  John Walton , a 87 y.o. male  was evaluated in triage.  Pt complains o sent from eye doctor.  He reportedly had a dark spot in his vision noticed last night.  He earlier today reportedly had difficulty speaking and had pronator drift.  The difficulty speaking and pronator drift have reportedly since resolved.  No chest pain.  No recent falls.   .  Review of Systems  Positive: Vision change since last night, resolved slurred speech, resolved weakness Negative: syncope  Physical Exam  BP 124/66 (BP Location: Left Arm)   Pulse 98   Temp 97.8 F (36.6 C) (Oral)   Resp 18   Ht 5\' 6"  (1.676 m)   Wt 65 kg   SpO2 100%   BMI 23.13 kg/m  Gen:   Awake, no distress   Resp:  Normal effort  MSK:   Moves extremities bilaterally.  No appreciable pronator drift.  Facial movements are symmetric.  No facial droop. Other:  Speech is not slurred.  No aphasia.    Medical Decision Making  Medically screening exam initiated at 7:21 PM.  Appropriate orders placed.  John Walton was informed that the remainder of the evaluation will be completed by another provider, this initial triage assessment does not replace that evaluation, and the importance of remaining in the ED until their evaluation is complete.  Note: Portions of this report may have been transcribed using voice recognition software. Every effort was made to ensure accuracy; however, inadvertent computerized transcription errors may be present    Ollen Gross 12/18/20 Dola Factor, MD 12/18/20 2346

## 2020-12-19 ENCOUNTER — Inpatient Hospital Stay (HOSPITAL_COMMUNITY): Payer: No Typology Code available for payment source

## 2020-12-19 DIAGNOSIS — I6389 Other cerebral infarction: Secondary | ICD-10-CM | POA: Diagnosis not present

## 2020-12-19 DIAGNOSIS — F845 Asperger's syndrome: Secondary | ICD-10-CM | POA: Diagnosis present

## 2020-12-19 DIAGNOSIS — E43 Unspecified severe protein-calorie malnutrition: Secondary | ICD-10-CM | POA: Diagnosis present

## 2020-12-19 DIAGNOSIS — I70234 Atherosclerosis of native arteries of right leg with ulceration of heel and midfoot: Secondary | ICD-10-CM | POA: Diagnosis not present

## 2020-12-19 DIAGNOSIS — D649 Anemia, unspecified: Secondary | ICD-10-CM

## 2020-12-19 DIAGNOSIS — I6521 Occlusion and stenosis of right carotid artery: Secondary | ICD-10-CM | POA: Diagnosis present

## 2020-12-19 DIAGNOSIS — D638 Anemia in other chronic diseases classified elsewhere: Secondary | ICD-10-CM | POA: Diagnosis present

## 2020-12-19 DIAGNOSIS — N179 Acute kidney failure, unspecified: Secondary | ICD-10-CM | POA: Diagnosis present

## 2020-12-19 DIAGNOSIS — I350 Nonrheumatic aortic (valve) stenosis: Secondary | ICD-10-CM | POA: Diagnosis present

## 2020-12-19 DIAGNOSIS — E0590 Thyrotoxicosis, unspecified without thyrotoxic crisis or storm: Secondary | ICD-10-CM | POA: Diagnosis present

## 2020-12-19 DIAGNOSIS — N390 Urinary tract infection, site not specified: Secondary | ICD-10-CM | POA: Diagnosis not present

## 2020-12-19 DIAGNOSIS — Z87891 Personal history of nicotine dependence: Secondary | ICD-10-CM

## 2020-12-19 DIAGNOSIS — Z01810 Encounter for preprocedural cardiovascular examination: Secondary | ICD-10-CM | POA: Diagnosis not present

## 2020-12-19 DIAGNOSIS — N184 Chronic kidney disease, stage 4 (severe): Secondary | ICD-10-CM | POA: Diagnosis present

## 2020-12-19 DIAGNOSIS — R531 Weakness: Secondary | ICD-10-CM | POA: Diagnosis present

## 2020-12-19 DIAGNOSIS — E1122 Type 2 diabetes mellitus with diabetic chronic kidney disease: Secondary | ICD-10-CM | POA: Diagnosis not present

## 2020-12-19 DIAGNOSIS — R1319 Other dysphagia: Secondary | ICD-10-CM

## 2020-12-19 DIAGNOSIS — E119 Type 2 diabetes mellitus without complications: Secondary | ICD-10-CM | POA: Diagnosis not present

## 2020-12-19 DIAGNOSIS — D62 Acute posthemorrhagic anemia: Secondary | ICD-10-CM | POA: Diagnosis present

## 2020-12-19 DIAGNOSIS — R471 Dysarthria and anarthria: Secondary | ICD-10-CM | POA: Diagnosis present

## 2020-12-19 DIAGNOSIS — I639 Cerebral infarction, unspecified: Secondary | ICD-10-CM | POA: Diagnosis not present

## 2020-12-19 DIAGNOSIS — L97419 Non-pressure chronic ulcer of right heel and midfoot with unspecified severity: Secondary | ICD-10-CM | POA: Diagnosis present

## 2020-12-19 DIAGNOSIS — E039 Hypothyroidism, unspecified: Secondary | ICD-10-CM | POA: Diagnosis present

## 2020-12-19 DIAGNOSIS — Z96642 Presence of left artificial hip joint: Secondary | ICD-10-CM | POA: Diagnosis present

## 2020-12-19 DIAGNOSIS — E785 Hyperlipidemia, unspecified: Secondary | ICD-10-CM | POA: Diagnosis present

## 2020-12-19 DIAGNOSIS — G8324 Monoplegia of upper limb affecting left nondominant side: Secondary | ICD-10-CM | POA: Diagnosis present

## 2020-12-19 DIAGNOSIS — I10 Essential (primary) hypertension: Secondary | ICD-10-CM

## 2020-12-19 DIAGNOSIS — I63411 Cerebral infarction due to embolism of right middle cerebral artery: Secondary | ICD-10-CM | POA: Diagnosis present

## 2020-12-19 DIAGNOSIS — Z20822 Contact with and (suspected) exposure to covid-19: Secondary | ICD-10-CM | POA: Diagnosis present

## 2020-12-19 DIAGNOSIS — Z9079 Acquired absence of other genital organ(s): Secondary | ICD-10-CM

## 2020-12-19 DIAGNOSIS — I6523 Occlusion and stenosis of bilateral carotid arteries: Secondary | ICD-10-CM | POA: Diagnosis not present

## 2020-12-19 DIAGNOSIS — H538 Other visual disturbances: Secondary | ICD-10-CM | POA: Diagnosis present

## 2020-12-19 DIAGNOSIS — I2510 Atherosclerotic heart disease of native coronary artery without angina pectoris: Secondary | ICD-10-CM | POA: Diagnosis present

## 2020-12-19 DIAGNOSIS — R29701 NIHSS score 1: Secondary | ICD-10-CM | POA: Diagnosis present

## 2020-12-19 DIAGNOSIS — Z66 Do not resuscitate: Secondary | ICD-10-CM | POA: Diagnosis present

## 2020-12-19 DIAGNOSIS — R2990 Unspecified symptoms and signs involving the nervous system: Secondary | ICD-10-CM | POA: Diagnosis not present

## 2020-12-19 DIAGNOSIS — I129 Hypertensive chronic kidney disease with stage 1 through stage 4 chronic kidney disease, or unspecified chronic kidney disease: Secondary | ICD-10-CM | POA: Diagnosis present

## 2020-12-19 DIAGNOSIS — R319 Hematuria, unspecified: Secondary | ICD-10-CM

## 2020-12-19 DIAGNOSIS — E1151 Type 2 diabetes mellitus with diabetic peripheral angiopathy without gangrene: Secondary | ICD-10-CM | POA: Diagnosis present

## 2020-12-19 DIAGNOSIS — I77810 Thoracic aortic ectasia: Secondary | ICD-10-CM | POA: Diagnosis present

## 2020-12-19 LAB — ECHOCARDIOGRAM COMPLETE
AR max vel: 2.12 cm2
AV Area VTI: 1.93 cm2
AV Area mean vel: 1.82 cm2
AV Mean grad: 16 mmHg
AV Peak grad: 24.8 mmHg
Ao pk vel: 2.49 m/s
Area-P 1/2: 4.49 cm2
Calc EF: 45.9 %
Height: 66 in
MV VTI: 3.41 cm2
S' Lateral: 3.3 cm
Single Plane A2C EF: 34.9 %
Single Plane A4C EF: 50.5 %
Weight: 2292.78 oz

## 2020-12-19 LAB — CBG MONITORING, ED: Glucose-Capillary: 109 mg/dL — ABNORMAL HIGH (ref 70–99)

## 2020-12-19 LAB — GLUCOSE, CAPILLARY
Glucose-Capillary: 141 mg/dL — ABNORMAL HIGH (ref 70–99)
Glucose-Capillary: 178 mg/dL — ABNORMAL HIGH (ref 70–99)

## 2020-12-19 LAB — CBC
HCT: 24.2 % — ABNORMAL LOW (ref 39.0–52.0)
Hemoglobin: 7.6 g/dL — ABNORMAL LOW (ref 13.0–17.0)
MCH: 28.5 pg (ref 26.0–34.0)
MCHC: 31.4 g/dL (ref 30.0–36.0)
MCV: 90.6 fL (ref 80.0–100.0)
Platelets: 277 10*3/uL (ref 150–400)
RBC: 2.67 MIL/uL — ABNORMAL LOW (ref 4.22–5.81)
RDW: 14 % (ref 11.5–15.5)
WBC: 6.9 10*3/uL (ref 4.0–10.5)
nRBC: 0 % (ref 0.0–0.2)

## 2020-12-19 LAB — HEMOGLOBIN A1C
Hgb A1c MFr Bld: 6.5 % — ABNORMAL HIGH (ref 4.8–5.6)
Mean Plasma Glucose: 139.85 mg/dL

## 2020-12-19 LAB — BASIC METABOLIC PANEL
Anion gap: 8 (ref 5–15)
BUN: 42 mg/dL — ABNORMAL HIGH (ref 8–23)
CO2: 19 mmol/L — ABNORMAL LOW (ref 22–32)
Calcium: 8.1 mg/dL — ABNORMAL LOW (ref 8.9–10.3)
Chloride: 111 mmol/L (ref 98–111)
Creatinine, Ser: 2.29 mg/dL — ABNORMAL HIGH (ref 0.61–1.24)
GFR, Estimated: 27 mL/min — ABNORMAL LOW (ref >60)
Glucose, Bld: 89 mg/dL (ref 70–99)
Potassium: 4.1 mmol/L (ref 3.5–5.1)
Sodium: 138 mmol/L (ref 135–145)

## 2020-12-19 LAB — PREPARE RBC (CROSSMATCH)

## 2020-12-19 LAB — LIPID PANEL
Cholesterol: 114 mg/dL (ref 0–200)
HDL: 36 mg/dL — ABNORMAL LOW (ref >40)
LDL Cholesterol: 58 mg/dL (ref 0–99)
Total CHOL/HDL Ratio: 3.2 RATIO
Triglycerides: 99 mg/dL (ref <150)
VLDL: 20 mg/dL (ref 0–40)

## 2020-12-19 LAB — T4, FREE: Free T4: 1.06 ng/dL (ref 0.61–1.12)

## 2020-12-19 LAB — TSH: TSH: 1.259 u[IU]/mL (ref 0.350–4.500)

## 2020-12-19 MED ORDER — ASPIRIN EC 325 MG PO TBEC
325.0000 mg | DELAYED_RELEASE_TABLET | Freq: Every day | ORAL | Status: DC
  Administered 2020-12-19 – 2020-12-21 (×3): 325 mg via ORAL
  Filled 2020-12-19 (×3): qty 1

## 2020-12-19 MED ORDER — SUCRALFATE 1 GM/10ML PO SUSP
1.0000 g | Freq: Three times a day (TID) | ORAL | Status: DC
  Administered 2020-12-19 – 2020-12-23 (×13): 1 g via ORAL
  Filled 2020-12-19 (×18): qty 10

## 2020-12-19 MED ORDER — HYDROCODONE-ACETAMINOPHEN 5-325 MG PO TABS
1.0000 | ORAL_TABLET | Freq: Four times a day (QID) | ORAL | Status: DC | PRN
  Administered 2020-12-19 – 2020-12-22 (×6): 1 via ORAL
  Filled 2020-12-19 (×6): qty 1

## 2020-12-19 MED ORDER — PROSOURCE TF PO LIQD
45.0000 mL | Freq: Two times a day (BID) | ORAL | Status: DC
  Administered 2020-12-19 – 2020-12-25 (×12): 45 mL
  Filled 2020-12-19 (×14): qty 45

## 2020-12-19 MED ORDER — BRINZOLAMIDE 1 % OP SUSP
1.0000 [drp] | Freq: Two times a day (BID) | OPHTHALMIC | Status: DC
  Administered 2020-12-19 – 2020-12-26 (×14): 1 [drp] via OPHTHALMIC
  Filled 2020-12-19: qty 10

## 2020-12-19 MED ORDER — LIFITEGRAST 5 % OP SOLN
1.0000 [drp] | Freq: Two times a day (BID) | OPHTHALMIC | Status: DC
  Administered 2020-12-21 – 2020-12-26 (×11): 1 [drp] via OPHTHALMIC
  Filled 2020-12-19 (×11): qty 0.2

## 2020-12-19 MED ORDER — METHIMAZOLE 10 MG PO TABS
10.0000 mg | ORAL_TABLET | Freq: Every day | ORAL | Status: DC
  Administered 2020-12-19 – 2020-12-26 (×7): 10 mg via ORAL
  Filled 2020-12-19 (×8): qty 1

## 2020-12-19 MED ORDER — FREE WATER
120.0000 mL | Freq: Two times a day (BID) | Status: DC
  Administered 2020-12-19 – 2020-12-26 (×15): 120 mL

## 2020-12-19 MED ORDER — INSULIN ASPART 100 UNIT/ML IJ SOLN
0.0000 [IU] | Freq: Three times a day (TID) | INTRAMUSCULAR | Status: DC
  Administered 2020-12-19: 1 [IU] via SUBCUTANEOUS
  Administered 2020-12-20: 2 [IU] via SUBCUTANEOUS
  Administered 2020-12-20: 1 [IU] via SUBCUTANEOUS
  Administered 2020-12-21: 3 [IU] via SUBCUTANEOUS
  Administered 2020-12-21: 2 [IU] via SUBCUTANEOUS
  Administered 2020-12-21: 1 [IU] via SUBCUTANEOUS
  Administered 2020-12-23: 3 [IU] via SUBCUTANEOUS
  Administered 2020-12-23: 1 [IU] via SUBCUTANEOUS
  Administered 2020-12-24 (×3): 2 [IU] via SUBCUTANEOUS
  Administered 2020-12-25: 3 [IU] via SUBCUTANEOUS
  Administered 2020-12-25: 2 [IU] via SUBCUTANEOUS
  Administered 2020-12-25: 1 [IU] via SUBCUTANEOUS

## 2020-12-19 MED ORDER — ATORVASTATIN CALCIUM 40 MG PO TABS
40.0000 mg | ORAL_TABLET | Freq: Every day | ORAL | Status: DC
  Administered 2020-12-19 – 2020-12-25 (×8): 40 mg via ORAL
  Filled 2020-12-19 (×8): qty 1

## 2020-12-19 MED ORDER — CALCIUM CARBONATE-VITAMIN D 500-200 MG-UNIT PO TABS
1.0000 | ORAL_TABLET | Freq: Three times a day (TID) | ORAL | Status: DC
  Administered 2020-12-19 – 2020-12-26 (×18): 1 via ORAL
  Filled 2020-12-19 (×19): qty 1

## 2020-12-19 MED ORDER — COLLAGENASE 250 UNIT/GM EX OINT
1.0000 "application " | TOPICAL_OINTMENT | CUTANEOUS | Status: DC
  Administered 2020-12-21 – 2020-12-25 (×3): 1 via TOPICAL

## 2020-12-19 MED ORDER — ACETAMINOPHEN 325 MG PO TABS: 650.0000 mg | ORAL_TABLET | ORAL | Status: DC | PRN

## 2020-12-19 MED ORDER — SODIUM CHLORIDE 0.9% IV SOLUTION: Freq: Once | INTRAVENOUS | Status: AC

## 2020-12-19 MED ORDER — DARBEPOETIN ALFA 40 MCG/0.4ML IJ SOSY
40.0000 ug | PREFILLED_SYRINGE | Freq: Once | INTRAMUSCULAR | Status: AC
  Administered 2020-12-19: 40 ug via SUBCUTANEOUS
  Filled 2020-12-19: qty 0.4

## 2020-12-19 MED ORDER — TEARS AGAIN OP OINT: 1.0000 "application " | TOPICAL_OINTMENT | Freq: Two times a day (BID) | OPHTHALMIC | Status: DC

## 2020-12-19 MED ORDER — HEPARIN SODIUM (PORCINE) 5000 UNIT/ML IJ SOLN
5000.0000 [IU] | Freq: Three times a day (TID) | INTRAMUSCULAR | Status: DC
  Administered 2020-12-19 – 2020-12-22 (×9): 5000 [IU] via SUBCUTANEOUS
  Filled 2020-12-19 (×9): qty 1

## 2020-12-19 MED ORDER — POLYVINYL ALCOHOL 1.4 % OP SOLN
1.0000 [drp] | Freq: Three times a day (TID) | OPHTHALMIC | Status: DC
  Administered 2020-12-19 – 2020-12-26 (×19): 1 [drp] via OPHTHALMIC
  Filled 2020-12-19 (×3): qty 15

## 2020-12-19 MED ORDER — LATANOPROST 0.005 % OP SOLN
1.0000 [drp] | Freq: Every day | OPHTHALMIC | Status: DC
  Administered 2020-12-19 – 2020-12-25 (×7): 1 [drp] via OPHTHALMIC
  Filled 2020-12-19: qty 2.5

## 2020-12-19 MED ORDER — ACETAMINOPHEN 650 MG RE SUPP: 650.0000 mg | RECTAL | Status: DC | PRN

## 2020-12-19 MED ORDER — ACETAMINOPHEN 160 MG/5ML PO SOLN: 650.0000 mg | ORAL | Status: DC | PRN

## 2020-12-19 MED ORDER — STROKE: EARLY STAGES OF RECOVERY BOOK
Freq: Once | Status: AC
  Filled 2020-12-19: qty 1

## 2020-12-19 MED ORDER — BRIMONIDINE TARTRATE 0.2 % OP SOLN
1.0000 [drp] | Freq: Two times a day (BID) | OPHTHALMIC | Status: DC
  Administered 2020-12-19 – 2020-12-26 (×14): 1 [drp] via OPHTHALMIC
  Filled 2020-12-19: qty 5

## 2020-12-19 NOTE — Consult Note (Addendum)
Hospital Consult  VASCULAR SURGERY ASSESSMENT & PLAN:   SYMPTOMATIC RIGHT CAROTID STENOSIS: This patient has a symptomatic greater than 80% right carotid stenosis.  His risk of future stroke is approximately 10 %/year.  I explained that we can lower this risk significantly with right carotid endarterectomy.  We have discussed the indications for the procedure and the potential complications including a 1 to 2% risk of perioperative stroke.  He does not have any significant cardiac history and has had no recent chest pain.  I can potentially proceed with surgery on Friday if the medical service feels that he is ready.  He is now on aspirin.  He is also on a statin.  Normally I would consider a CT angiogram of the neck to further assess the stenosis however given his chronic kidney disease I do not want to run the risk of worsening his renal insufficiency with contrast.  Gae Gallop, MD 3:30 PM   Reason for Consult:  carotid stenosis Requesting Physician:  Dr. Sloan Leiter MRN #:  [id number]  History of Present Illness: This is a 87 y.o. male who presented to the ED with vision disturbance. He also reported to ED personnel weakness in LUE and dysarthria. He is seen and evaluated in the ED. He is awake, alert in NAD. Symptoms were fleeting and have resolved.  He has history bilateral carotid artery stenosis followed by Dr. Scot Dock. At his last exam on 629/2022, he had asymptomatic 60-79% right ICA stenosis.  The pt is on a statin for cholesterol management.  The pt is not on a daily aspirin. (refuses)  Other AC:  none The pt is on no medication for hypertension.   The pt is not diabetic.   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[facility])     Past Surgical History:  Procedure Laterality Date   CHOLECYSTECTOMY, LAPAROSCOPIC     IR GASTROSTOMY TUBE MOD SED  03/29/2020   TONSILLECTOMY  04/01/1940   TOTAL HIP ARTHROPLASTY Left 03/08/2020   Procedure: TOTAL HIP ARTHROPLASTY;  Surgeon: Marybelle Killings, MD;  Location: [facility];  Service: Orthopedics;  Laterality: Left;   TRANSURETHRAL RESECTION OF PROSTATE N/A 12/14/2020   Procedure: TRANSURETHRAL RESECTION OF THE PROSTATE (TURP);  Surgeon: Franchot Gallo, MD;  Location: [facility];  Service: Urology;  Laterality: N/A;  2 HRS    Allergies  Allergen Reactions   Other Swelling    ALL COUGH MEDICATIONS   Robitussin Cold Cough+ Chest [Dextromethorphan-Guaifenesin] Swelling    Laryngeal edema - any cough syrup    Prior to Admission medications   Medication Sig Start Date End Date Taking? Authorizing Provider  atorvastatin (LIPITOR) 40 MG tablet Place 1 tablet (40 mg total) into feeding tube daily. Patient taking differently: Take 40 mg by mouth at bedtime. 04/03/20  Yes Aline August, MD  calcium-vitamin D (OSCAL WITH D) 500-200 MG-UNIT tablet Place 1 tablet into  feeding tube with breakfast, with lunch, and with evening meal. Patient taking differently: Take 1 tablet by mouth with breakfast, with lunch, and with evening meal. 04/03/20 11/17/21 Yes Alekh, Kshitiz, MD  carboxymethylcellulose (REFRESH PLUS) 0.5 % SOLN Place 1 drop into both eyes in the morning, at noon, and at bedtime.   Yes [provider]  collagenase (SANTYL) ointment Apply 1 application topically every other day. APPLIED TO HEEL WOUND   Yes [provider]  HYDROcodone-Acetaminophen (NORCO PO) Take 1 tablet by mouth every 6 (six) hours as needed (pain).   Yes  [provider]  latanoprost (XALATAN) 0.005 % ophthalmic solution Place 1 drop into both eyes at bedtime.  03/13/19  Yes [provider]  Lifitegrast Shirley Friar) 5 % SOLN Place 1 drop into both eyes in the morning and at bedtime.   Yes [provider]  methimazole (TAPAZOLE) 10 MG tablet Take 10 mg by mouth in the morning.   Yes [provider]  Nutritional Supplements (PROSOURCE) LIQD Give 30 mLs by tube in the morning and at bedtime.   Yes [provider]  SIMBRINZA 1-0.2 % SUSP Place 1 drop into both eyes in the morning and at bedtime.  05/16/19  Yes [provider]  sucralfate (CARAFATE) 1 GM/10ML suspension Take 10 mLs (1 g total) by mouth 4 (four) times daily -  with meals and at bedtime. Patient taking differently: Take 1 g by mouth 3 (three) times daily before meals. 04/03/20  Yes Aline August, MD  Water For Irrigation, Sterile (FREE WATER) SOLN Place 120 mLs into feeding tube 5 (five) times daily. Patient taking differently: Place 120 mLs into feeding tube in the morning and at bedtime. 04/03/20  Yes Aline August, MD  White Petrolatum-Mineral Oil (TEARS AGAIN) OINT Place 1 application into both eyes in the morning and at bedtime.   Yes [provider]  Insulin Pen Needle 29G X 12.7MM MISC Use as directed 03/13/20   Allie Bossier, MD    Social History   Socioeconomic History   Marital status: Married    Spouse name: Not on file   Number of children: Not on file   Years of education: Not on file   Highest education level: Not on file  Occupational History   Not on file  Tobacco Use   Smoking status: Former    Types: Cigarettes    Start date: 04/01/1968    Quit date: 01/11/1977    Years since quitting: 43.9   Smokeless tobacco: Never  Vaping Use   Vaping Use: Never used  Substance and Sexual Activity   Alcohol use: Not Currently   Drug use: No   Sexual activity: Never  Other Topics Concern   Not on file  Social  History Narrative   Not on file   Social Determinants of Health   Financial Resource Strain: Not on file  Food Insecurity: Not on file  Transportation Needs: Not on file  Physical Activity: Not on file  Stress: Not on file  Social Connections: Not on file  Intimate Partner Violence: Not on file     Family History  Problem Relation Age of Onset   Liver disease Mother    Skin cancer Father    Skin cancer Paternal Grandfather    Diabetes Maternal Grandfather     ROS: [x]  Positive   [ ]  Negative   [ ]  All sytems reviewed and are negative  Cardiac: []  chest pain/pressure []  palpitations []  SOB lying  flat []  DOE  Vascular: []  pain in legs while walking []  pain in legs at rest []  pain in legs at night [x]  non-healing ulcers []  hx of DVT []  swelling in legs  Pulmonary: []  productive cough []  asthma/wheezing []  home O2  Neurologic: [x]  weakness in [x]  arms []  legs []  numbness in []  arms []  legs [x]  hx of CVA []  mini stroke [x] difficulty speaking or slurred speech []  temporary loss of vision in one eye []  dizziness  Hematologic: []  hx of cancer []  bleeding problems []  problems with blood clotting easily  Endocrine:   []  diabetes []  thyroid disease  GI []  vomiting blood []  blood in stool  GU: []  CKD/renal failure []  HD--[]  M/W/F or []  T/T/S []  burning with urination []  blood in urine  Psychiatric: []  anxiety []  depression  Musculoskeletal: []  arthritis []  joint pain  Integumentary: []  rashes []  ulcers  Constitutional: []  fever []  chills   Physical Examination  Vitals:   12/19/20 0900 12/19/20 1030  BP: 126/60 (!) 124/57  Pulse: 95 84  Resp: 20 11  Temp:    SpO2: 97% 98%   Body mass index is 23.13 kg/m.  General:  WDWN in NAD Gait: Not observed HENT: WNL, normocephalic Pulmonary: normal non-labored breathing, without Rales, rhonchi,  wheezing Cardiac: regular, with Murmurs, rubs or gallops; with left carotid bruit Abdomen:   soft, NT/ND, no masses, PEG site without erythema/drainage Skin: without rashes Vascular Exam/Pulses: 2+ radial and DP pulses bilaterally Extremities: without ischemic changes, without Gangrene , without cellulitis; with open wounds>>healing right heel ulcer Musculoskeletal: no muscle wasting or atrophy. 5/5 grip strength  Neurologic: A&O X 3;  No focal weakness or paresthesias are detected; speech is fluent/normal. Tongue is midline. Face symmetric. Psychiatric:  The pt has Normal affect.    CBC    Component Value Date/Time   WBC 6.9 12/19/2020 0742   RBC 2.67 (L) 12/19/2020 0742   HGB 7.6 (L) 12/19/2020 0742   HCT 24.2 (L) 12/19/2020 0742   PLT 277 12/19/2020 0742   MCV 90.6 12/19/2020 0742   MCH 28.5 12/19/2020 0742   MCHC 31.4 12/19/2020 0742   RDW 14.0 12/19/2020 0742   LYMPHSABS 1.0 12/18/2020 1944   MONOABS 1.1 (H) 12/18/2020 1944   EOSABS 0.1 12/18/2020 1944   BASOSABS 0.0 12/18/2020 1944    BMET    Component Value Date/Time   NA 138 12/19/2020 0742   K 4.1 12/19/2020 0742   CL 111 12/19/2020 0742   CO2 19 (L) 12/19/2020 0742   GLUCOSE 89 12/19/2020 0742   BUN 42 (H) 12/19/2020 0742   CREATININE 2.29 (H) 12/19/2020 0742   CALCIUM 8.1 (L) 12/19/2020 0742   GFRNONAA 27 (L) 12/19/2020 0742   GFRAA 40 (L) 04/23/2015 1231    COAGS: Lab Results  Component Value Date   INR 1.2 12/18/2020   INR 1.1 03/07/2020     Non-Invasive Vascular Imaging:   12/19/2020 Carotid duplex:  I have independently interpreted his carotid duplex scan which shows a greater than 80% right carotid stenosis with no significant stenosis on the left.  The right carotid stenosis appears to be limited to the proximal ICA.  Summary:  Right Carotid: Velocities in the right ICA are consistent with a 80-99% stenosis.   Left Carotid: Velocities in the left ICA are consistent with a 1-39% stenosis.   Vertebrals: Bilateral vertebral arteries demonstrate antegrade flow.   *See table(s)  above for measurements and observations.  Preliminary    MRA head: FINDINGS: Intracranial internal carotid arteries are patent. Middle and anterior cerebral arteries are patent. Intracranial vertebral arteries, basilar artery, posterior cerebral arteries are patent. Bilateral posterior communicating arteries are present. There is no significant stenosis or aneurysm.   IMPRESSION: Normal MRA head.  MRI head: IMPRESSION: 1. Patchy multifocal acute ischemic infarcts involving the cortical gray matter of the right frontal, parietal, and occipital lobes as above. No associated hemorrhage or mass effect. 2. 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 3. Underlying moderate chronic microvascular ischemic disease.    Echo: Normal LV function; no atrial level shunt   ASSESSMENT/PLAN: This is a 87 y.o. male with symptomatic right ICA stenosis. MRI head with evidence of multifocal acute right infarcts. Neuro at baseline. Duplex estimates 80-99% right ICA stenosis, left 1-39%.  Recommendations to follow  PAD, right heel ulcer. Palpable DP pulses. Prior evaluation in June. Toe pressures reasonable in June and right heel ulcer is improved.  Anemia: transfused 1 unit  CKD stage IV   -Dr Scot Dock, on call vascular surgeon, will evaluate.    Risa Grill, PA-C [facility] [phone]

## 2020-12-19 NOTE — ED Notes (Signed)
Patient transported to MRI 

## 2020-12-19 NOTE — ED Notes (Signed)
Teletracking ordered for pt.

## 2020-12-19 NOTE — Plan of Care (Signed)

## 2020-12-19 NOTE — H&P (Addendum)
History and Physical    John Walton PPI:[id number] DOB: [birth year] DOA: 12/18/2020  PCP: [name], MD  Patient coming from: Home  I have personally briefly reviewed patient's old medical records in [place]  Chief Complaint: Stroke  HPI: John Walton is a 87 y.o. male with medical history significant of HTN, HLD, DM2, CAD, CKD 4.  Pt has BPH is S/P TURP x5 days ago.  Having small amount of hematuria since TURP.  Pt presents to ED with c/o L sided vision changes, L hand numbness, weakness, and dysarthria.  Symptoms onset around 0900 this AM.  Pt went to eye doctor who promptly sent pt in to ED due to concern for stroke.  Symptoms gradually improving throughout the day and now nearly resolved.  Currently acting at baseline per spouse at bedside.  History taking from pt is somewhat difficult due to him being very hard of hearing.  Vision changes and dysarthria have resolved.  Still has some weakness to LUE.  No recent trauma (hit head in December).  Uses walker at baseline since hip Fx last year.   ED Course: HGB 7.2 down from 10 last month.  Creat 2.45 (essentially baseline)  UA with large blood, small LE, 21-50 WBC.  Given rocephin in ED for ? UTI.  MRI brain reveals multifocal R hemispheric acute ischemic strokes.  On further review: pt actually has known R 60-79% carotid stenosis, evaluated in office by vasc surgery in June this year but felt that it didn't need surgery at that time due to being asymptomatic.   Review of Systems: As per HPI, otherwise all review of systems negative.  Past Medical History:  Diagnosis Date   Anemia    Chicken pox    Chronic kidney disease    Coronary artery disease    Diabetes mellitus without complication (HCC)    Frequent headaches    Glaucoma    Hay fever    History of blood transfusion    HTN (hypertension)    Hyperlipidemia    Hyperlipidemia    Hypothyroidism    Migraines    Peripheral vascular  disease ([place])    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[facility])     Past Surgical History:  Procedure Laterality Date   CHOLECYSTECTOMY, LAPAROSCOPIC     IR GASTROSTOMY TUBE MOD SED  03/29/2020   TONSILLECTOMY  04/01/1940   TOTAL HIP ARTHROPLASTY Left 03/08/2020   Procedure: TOTAL HIP ARTHROPLASTY;  Surgeon: Marybelle Killings, MD;  Location: [facility];  Service: Orthopedics;  Laterality: Left;   TRANSURETHRAL RESECTION OF PROSTATE N/A 12/14/2020   Procedure: TRANSURETHRAL RESECTION OF THE PROSTATE (TURP);  Surgeon: Franchot Gallo, MD;  Location: [facility];  Service: Urology;  Laterality: N/A;  2 HRS     reports that he quit smoking about 43 years ago. His smoking use included cigarettes. He started smoking about 52 years ago. He has never used smokeless tobacco. He reports that he does not currently use alcohol. He reports that he does not use drugs.  Allergies  Allergen Reactions   Other Swelling    ALL COUGH MEDICATIONS   Robitussin Cold Cough+ Chest [Dextromethorphan-Guaifenesin] Swelling    Laryngeal edema - any cough syrup    Family History  Problem Relation Age of Onset   Liver disease Mother    Skin cancer Father    Skin cancer Paternal Grandfather    Diabetes Maternal Grandfather      Prior to Admission medications   Medication  Sig Start Date End Date Taking? Authorizing Provider  cephALEXin (KEFLEX) 500 MG capsule Take 1 capsule (500 mg total) by mouth 2 (two) times daily for 6 doses. 12/15/20 12/19/20 Yes Dahlstedt, Annie Main, MD  atorvastatin (LIPITOR) 40 MG tablet Place 1 tablet (40 mg total) into feeding tube daily. Patient taking differently: Take 40 mg by mouth at bedtime. 04/03/20   Aline August, MD  calcium-vitamin D (OSCAL WITH D) 500-200 MG-UNIT tablet Place 1 tablet into feeding tube with breakfast, with lunch, and with evening meal. Patient taking differently: Take 1 tablet by mouth with breakfast, with lunch, and with evening meal. 04/03/20 11/17/21  Aline August, MD   carboxymethylcellulose (REFRESH PLUS) 0.5 % SOLN Place 1 drop into both eyes in the morning, at noon, and at bedtime.    [provider]  collagenase (SANTYL) ointment Apply 1 application topically every other day. APPLIED TO HEEL WOUND    [provider]  Insulin Pen Needle 29G X 12.7MM MISC Use as directed 03/13/20   Allie Bossier, MD  latanoprost (XALATAN) 0.005 % ophthalmic solution Place 1 drop into both eyes at bedtime.  03/13/19   [provider]  Lifitegrast Shirley Friar) 5 % SOLN Place 1 drop into both eyes in the morning and at bedtime.    [provider]  methimazole (TAPAZOLE) 10 MG tablet Take 10 mg by mouth in the morning.    [provider]  Nutritional Supplements (PROSOURCE) LIQD Give 30 mLs by tube in the morning and at bedtime.    [provider]  SIMBRINZA 1-0.2 % SUSP Place 1 drop into both eyes in the morning and at bedtime.  05/16/19   [provider]  sucralfate (CARAFATE) 1 GM/10ML suspension Take 10 mLs (1 g total) by mouth 4 (four) times daily -  with meals and at bedtime. Patient taking differently: Take 1 g by mouth 3 (three) times daily before meals. 04/03/20   Aline August, MD  Water For Irrigation, Sterile (FREE WATER) SOLN Place 120 mLs into feeding tube 5 (five) times daily. Patient taking differently: Place 120 mLs into feeding tube in the morning and at bedtime. 04/03/20   Aline August, MD  White Petrolatum-Mineral Oil (TEARS AGAIN) OINT Place 1 application into both eyes in the morning and at bedtime.    [provider]    Physical Exam: Vitals:   12/18/20 2338 12/19/20 0000 12/19/20 0100 12/19/20 0130  BP: 136/65 134/60 (!) 161/61 140/62  Pulse: 94 95 (!) 114 98  Resp: 10 12 17 11   Temp:      TempSrc:      SpO2: 98% 98% 98% 97%  Weight:      Height:        Constitutional: NAD, calm, comfortable Eyes: PERRL, lids and conjunctivae normal ENMT: Mucous membranes are moist. Posterior  pharynx clear of any exudate or lesions.Normal dentition. Very hard of hearing Neck: normal, supple, no masses, no thyromegaly Respiratory: clear to auscultation bilaterally, no wheezing, no crackles. Normal respiratory effort. No accessory muscle use.  Cardiovascular: Regular rate and rhythm, no murmurs / rubs / gallops. No extremity edema. 2+ pedal pulses. No carotid bruits.  Abdomen: Peg tube in place Musculoskeletal: no clubbing / cyanosis. No joint deformity upper and lower extremities. Good ROM, no contractures. Normal muscle tone.  Skin: no rashes, lesions, ulcers. No induration Neurologic: 4/5 strength LUE, 5/5 on R. Psychiatric: Normal judgment and insight. Alert and oriented x 3. Normal mood.    Labs on Admission:  I have personally reviewed following labs and imaging studies  CBC: Recent Labs  Lab 12/18/20 1944 12/18/20 1952  WBC 11.4*  --   NEUTROABS 9.1*  --   HGB 7.2* 7.1*  HCT 23.4* 21.0*  MCV 90.3  --   PLT 380  --    Basic Metabolic Panel: Recent Labs  Lab 12/14/20 1100 12/18/20 1944 12/18/20 1952  NA 134* 136 138  K 4.2 4.9 4.6  CL 108 108 109  CO2 21* 21*  --   GLUCOSE 112* 160* 159*  BUN 39* 43* 42*  CREATININE 2.39* 2.45* 2.50*  CALCIUM 8.6* 8.4*  --    GFR: Estimated Creatinine Clearance: 19.1 mL/min (A) (by C-G formula based on SCr of 2.5 mg/dL (H)). Liver Function Tests: Recent Labs  Lab 12/18/20 1944  AST 13*  ALT 8  ALKPHOS 83  BILITOT 0.1*  PROT 6.9  ALBUMIN 3.0*   No results for input(s): LIPASE, AMYLASE in the last 168 hours. No results for input(s): AMMONIA in the last 168 hours. Coagulation Profile: Recent Labs  Lab 12/18/20 1944  INR 1.2   Cardiac Enzymes: No results for input(s): CKTOTAL, CKMB, CKMBINDEX, TROPONINI in the last 168 hours. BNP (last 3 results) No results for input(s): PROBNP in the last 8760 hours. HbA1C: No results for input(s): HGBA1C in the last 72 hours. CBG: Recent Labs  Lab 12/14/20 1042  12/14/20 1440 12/14/20 1622 12/14/20 2155 12/15/20 0737  GLUCAP 108* 120* 162* 181* 148*   Lipid Profile: No results for input(s): CHOL, HDL, LDLCALC, TRIG, CHOLHDL, LDLDIRECT in the last 72 hours. Thyroid Function Tests: No results for input(s): TSH, T4TOTAL, FREET4, T3FREE, THYROIDAB in the last 72 hours. Anemia Panel: No results for input(s): VITAMINB12, FOLATE, FERRITIN, TIBC, IRON, RETICCTPCT in the last 72 hours. Urine analysis:    Component Value Date/Time   COLORURINE YELLOW 12/18/2020 1934   APPEARANCEUR TURBID (A) 12/18/2020 1934   LABSPEC 1.025 12/18/2020 1934   PHURINE 5.5 12/18/2020 1934   GLUCOSEU NEGATIVE 12/18/2020 1934   GLUCOSEU 500 (A) 07/11/2014 0850   HGBUR LARGE (A) 12/18/2020 1934   BILIRUBINUR NEGATIVE 12/18/2020 1934   KETONESUR NEGATIVE 12/18/2020 1934   PROTEINUR >300 (A) 12/18/2020 1934   UROBILINOGEN 0.2 07/11/2014 0850   NITRITE NEGATIVE 12/18/2020 1934   LEUKOCYTESUR SMALL (A) 12/18/2020 1934    Radiological Exams on Admission: CT HEAD WO CONTRAST  Result Date: 12/18/2020 CLINICAL DATA:  TIA. EXAM: CT HEAD WITHOUT CONTRAST TECHNIQUE: Contiguous axial images were obtained from the base of the skull through the vertex without intravenous contrast. COMPARISON:  CT head 03/07/2020. FINDINGS: Brain: No evidence of acute infarction, hemorrhage, hydrocephalus, extra-axial collection or mass lesion/mass effect. There is stable mild diffuse atrophy. There is also stable mild periventricular white matter hypodensity, likely chronic small vessel ischemic change. 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 Vascular: Atherosclerotic calcifications are present within the cavernous internal carotid arteries. Skull: Normal. Negative for fracture or focal lesion. Sinuses/Orbits: No acute finding. Other: None. IMPRESSION: No acute intracranial abnormality. Electronically Signed   By: Ronney Asters M.D.   On: 12/18/2020 21:12    MR BRAIN WO CONTRAST  Result Date: 12/19/2020 CLINICAL DATA:  Initial evaluation for neuro deficit, stroke suspected. EXAM: MRI HEAD WITHOUT CONTRAST TECHNIQUE: Multiplanar, multiecho pulse sequences of the brain and surrounding structures were obtained without intravenous contrast. COMPARISON:  CT from earlier the same day. FINDINGS: Brain: Generalized age-related cerebral atrophy. Patchy and confluent  T2/FLAIR hyperintensity involving the periventricular deep white matter both cerebral hemispheres as well as the pons, most consistent with chronic small vessel ischemic disease moderate in nature. Encephalomalacia and gliosis involving the anterior right frontal region consistent with a chronic right MCA distribution infarct. Patchy multifocal areas of restricted diffusion seen involving the cortical gray matter of the right frontal, parietal, and occipital lobes, consistent with acute ischemic infarcts. These measure up to approximately 1 cm in size, and are somewhat watershed in distribution. No associated hemorrhage or mass effect. No other evidence for acute or subacute ischemia. Gray-white matter differentiation otherwise maintained. Few scattered additional chronic micro hemorrhages noted involving the right greater than left cerebral hemispheres, likely small vessel/hypertensive in nature. No mass lesion, midline shift or mass effect. No hydrocephalus or extra-axial fluid collection. Pituitary gland suprasellar region within normal limits. Midline structures intact. Vascular: Major intracranial vascular flow voids are grossly maintained. Skull and upper cervical spine: Craniocervical junction within normal limits. Bone marrow signal intensity normal. No scalp soft tissue abnormality. Sinuses/Orbits: Prior bilateral ocular lens replacement. Mild scattered mucosal thickening noted within the ethmoidal air cells. Paranasal sinuses are otherwise clear. No significant mastoid effusion. Inner ear structures  grossly normal. Other: None. IMPRESSION: 1. Patchy multifocal acute ischemic infarcts involving the cortical gray matter of the right frontal, parietal, and occipital lobes as above. No associated hemorrhage or mass effect. 2. 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 3. Underlying moderate chronic microvascular ischemic disease. Electronically Signed   By: Jeannine Boga M.D.   On: 12/19/2020 00:18    EKG: Independently reviewed.  Assessment/Plan Principal Problem:   Acute ischemic stroke [facility]) Active Problems:   Essential hypertension   CKD (chronic kidney disease), stage IV (HCC)   Dysphagia   Protein-calorie malnutrition, severe   DM (diabetes mellitus), type 2 (HCC)   S/P TURP (status post transurethral resection of prostate)   ABLA (acute blood loss anemia)   Symptomatic stenosis of right carotid artery    Acute ischemic stroke - Multifocal R sided strokes, likely related to a, now symptomatic, known R carotid stenosis. Stroke pathway Tele monitor Neuro consult Vasc surgery consult in AM: ? CEA and if so timing of CEA 2d echo Will hold off on ordering further imaging at this time given recent carotid imaging (June) and defer to neurology / vascular surgery at this point. PT/OT/SLP Delay start of antiplatelets for the moment given anemia, ongoing hematuria. FLP/A1C Acute on chronic anemia - Acute component is likely ABLA from TURP 5 days ago and ongoing hematuria 1u PRBC transfusion ordered, concern that symptomatic anemia may be the cause of his stroke when paired with the stenotic carotid artery. Repeat CBC in AM after transfusion. Holding off on starting anti-platelets in setting of active hematuria. Tele monitor HTN - Not on any chronic BP meds at home Allowing permissive HTN anyhow in setting of acute stroke. DM2 - Looks to be diet controlled HLD - cont statin H/O Dysphagia - Prior, from h/o Laryngeal / esophageal edema  associated with allergic rxn to robitussin. Still has PEG tube in place due to malnutrition and esophageal edema previously though now he only takes meds, prosource and water BID through the PEG. Takes most of his food orally now per wife. Can take solid foods, thin liquids, no diet restriction at this time per wife. Passed swallow screen in ED. Will let him eat. S/p TURP, ? UTI - UCx pending Just finished keflex post TURP Got 1 dose of  rocephin in ED, will hold off on further pending UCx for the moment.  DVT prophylaxis: SCDs Code Status: Full Family Communication: Wife at bedside Disposition Plan: Home after stroke work up and treatment of carotid artery, suspect he may end up needing CEA (TBD by vasc surgery). Consults called: Dr. Curly [place] Admission status: Admit to inpatient  Severity of Illness: The appropriate patient status for this patient is INPATIENT. Inpatient status is judged to be reasonable and necessary in order to provide the required intensity of service to ensure the patient's safety. The patient's presenting symptoms, physical exam findings, and initial radiographic and laboratory data in the context of their chronic comorbidities is felt to place them at high risk for further clinical deterioration. Furthermore, it is not anticipated that the patient will be medically stable for discharge from the hospital within 2 midnights of admission. The following factors support the patient status of inpatient.   IP status for acute ischemic stroke demonstrated on MRI in setting of carotid stenosis.  Suspect pt may need CEA.  * I certify that at the point of admission it is my clinical judgment that the patient will require inpatient hospital care spanning beyond 2 midnights from the point of admission due to high intensity of service, high risk for further deterioration and high frequency of surveillance required.*   Jedi Catalfamo M. DO [facility]  How to contact the  [facility] Attending or Consulting provider [place] or covering provider during after hours [place], for this patient?  Check the care team in [facility] and look for a) attending/consulting [facility] provider listed and b) the [facility] team listed Log into [email or web address]  Amion Physician Scheduling and messaging for groups and whole hospitals  On call and physician scheduling software for group practices, residents, hospitalists and other medical providers for call, clinic, rotation and shift schedules. OnCall Enterprise is a hospital-wide system for scheduling doctors and paging doctors on call. EasyPlot is for scientific plotting and data analysis.  [email or web address]  and use [place]'s universal password to access. If you do not have the password, please contact the hospital operator.  Locate the [facility] provider you are looking for under [facility] and page to a number that you can be directly reached. If you still have difficulty reaching the provider, please page the [facility] (Director on Call) for the Hospitalists listed on amion for assistance.  12/19/2020, 1:55 AM

## 2020-12-19 NOTE — ED Notes (Signed)
Inpatient nurse said pt ok to come upstairs.

## 2020-12-19 NOTE — Evaluation (Signed)
Speech Language Pathology Evaluation Patient Details Name: John Walton MRN: [id number] DOB: [birth year] Today's Date: 12/19/2020 Time: 6195-0932 SLP Time Calculation (min) (ACUTE ONLY): 14 min  Problem List:  Patient Active Problem List   Diagnosis Date Noted   Acute ischemic stroke ([place]) 12/19/2020   S/P TURP (status post transurethral resection of prostate) 12/19/2020   ABLA (acute blood loss anemia) 12/19/2020   Symptomatic stenosis of right carotid artery 12/19/2020   BPH with obstruction/lower urinary tract symptoms 12/14/2020   Hypocalcemia    Protein-calorie malnutrition, severe 03/29/2020   Hypokalemia 03/29/2020   Hypernatremia 03/29/2020   DM (diabetes mellitus), type 2 ([place]) 03/29/2020   Fever 03/29/2020   Debility 03/29/2020   Dysphagia 03/26/2020   Bilateral hydronephrosis 03/08/2020   Unilateral primary osteoarthritis, left hip    Femoral neck fracture ([place]) 03/07/2020   CKD (chronic kidney disease), stage IV ([place]) 03/07/2020   Normocytic anemia 03/07/2020   BPH (benign prostatic hyperplasia) 03/07/2020   Abdominal mass 03/07/2020   Scalp hematoma 03/07/2020   Hypertensive urgency 03/07/2020   Pain due to onychomycosis of toenails of both feet 08/13/2019   RBBB 09/02/2014   Screening, ischemic heart disease 07/11/2014   Medicare annual wellness visit, subsequent 07/11/2014   Prostate cancer screening 07/11/2014   Essential hypertension 01/11/2014   Hyperlipidemia 01/11/2014   Past Medical History:  Past Medical History:  Diagnosis Date   Anemia    Chicken pox    Chronic kidney disease    Coronary artery disease    Diabetes mellitus without complication (HCC)    Frequent headaches    Glaucoma    Hay fever    History of blood transfusion    HTN (hypertension)    Hyperlipidemia    Hyperlipidemia    Hypothyroidism    Migraines    Peripheral vascular disease ([place])    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[facility])    Past Surgical History:  Past  Surgical History:  Procedure Laterality Date   CHOLECYSTECTOMY, LAPAROSCOPIC     IR GASTROSTOMY TUBE MOD SED  03/29/2020   TONSILLECTOMY  04/01/1940   TOTAL HIP ARTHROPLASTY Left 03/08/2020   Procedure: TOTAL HIP ARTHROPLASTY;  Surgeon: Marybelle Killings, MD;  [facility];  Service: Orthopedics;  Laterality: Left;   TRANSURETHRAL RESECTION OF PROSTATE N/A 12/14/2020   Procedure: TRANSURETHRAL RESECTION OF THE PROSTATE (TURP);  Surgeon: Franchot Gallo, MD;  [facility];  Service: Urology;  Laterality: N/A;  2 HRS   HPI:  87 y.o. male presented to ED with dysarthria and vision changes.  MRI showed acute infarcts right frontal, parietal, and occipital lobes.  Pt with medical history significant for HOH, HTN, HLD, DM2, CAD, CKD 4.  MRI: 1.  Patchy multifocal acute ischemic infarcts involving the cortical gray matter of the right frontal, parietal, and occipital lobes as above. No associated hemorrhage or mass effect. 2. 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 3. Underlying moderate chronic microvascular ischemic disease.  Pt has PEG due to malnutrition, hx of esophageal edema, but has been on oral diet.   Assessment / Plan / Recommendation Clinical Impression  Pt participated in the SLUMs, achieving a score of 28/30, considered WNL.  He demonstrated excellent recall of single words and paragraph information. His attention was normal during exam.  His wife reports some changes in short-term memory in recent months.  Points lost were specific to clock-drawing task with regard to number placement - however, pt did recognize presence of errors and  made attempts to self-correct.  Speech is no longer dysarthric.  Fluency, expressive, and receptive language are WNL.  There are no SLP f/u needs - our service will sign off. D/W pt/wife who are in agreement.    SLP Assessment  SLP Recommendation/Assessment: Patient does not need any further Speech [place] Pathology  Services SLP Visit Diagnosis: Cognitive communication deficit (R41.841)    Recommendations for follow up therapy are one component of a multi-disciplinary discharge planning process, led by the attending physician.  Recommendations may be updated based on patient status, additional functional criteria and insurance authorization.    Follow Up Recommendations  None    Frequency and Duration           SLP Evaluation Cognition  Overall Cognitive Status: Within Functional Limits for tasks assessed Arousal/Alertness: Awake/alert Orientation Level: Oriented X4 Attention: Selective Selective Attention: Appears intact Memory: Appears intact Awareness: Appears intact       Comprehension  Auditory Comprehension Overall Auditory Comprehension: Appears within functional limits for tasks assessed    Expression Expression Primary Mode of Expression: Verbal Verbal Expression Overall Verbal Expression: Appears within functional limits for tasks assessed Written Expression Dominant Hand: Right   Oral / Motor  Oral Motor/Sensory Function Overall Oral Motor/Sensory Function: Within functional limits Motor Speech Overall Motor Speech: Appears within functional limits for tasks assessed   GO                   John Walton Ringer, [place] Office number [phone] Pager [phone]  John Walton 12/19/2020, 9:42 AM

## 2020-12-19 NOTE — Evaluation (Signed)
Physical Therapy Evaluation Patient Details Name: John Walton MRN: [id number] DOB: 12-31-1932 Today's Date: 12/19/2020  History of Present Illness  Pt is an 87 y/o male admitted 9/19 secondary to dysarthria, visual deficits, and L hand numbness. 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 Pt also found to have R carotid stenosis and per notes likely for Right carotid endarterectomy during admission. PMH includes glaucoma, CKD, DM, HTN, PVD.  Clinical Impression  Pt admitted secondary to problem above with deficits below. Pt easily distracted and tangential during session. Requiring mod  A for bed mobility tasks and min to light mod A for transfers and taking steps at EOB. Pt was previously mod I with use of RW. Feel he will benefit from CIR level therapies to increase independence and safety with mobility prior to return home. Will continue to follow acutely.        Recommendations for follow up therapy are one component of a multi-disciplinary discharge planning process, led by the attending physician.  Recommendations may be updated based on patient status, additional functional criteria and insurance authorization.  Follow Up Recommendations CIR    Equipment Recommendations  Other (comment) (TBD)    Recommendations for Other Services       Precautions / Restrictions Precautions Precautions: Fall Restrictions Weight Bearing Restrictions: No      Mobility  Bed Mobility Overal bed mobility: Needs Assistance Bed Mobility: Supine to Sit;Sit to Supine;Rolling Rolling: Mod assist   Supine to sit: Mod assist Sit to supine: Mod assist   General bed mobility comments: Required assist for trunk and LE assist to perform bed mobility. Mod A for rolling side to side for clean up. Max multimodal cues for sequencing.    Transfers Overall transfer level: Needs assistance Equipment used: 1 person hand held assist Transfers: Sit to/from Stand Sit to Stand: Min  assist         General transfer comment: Pt requiring min A for lift assist and steadying to stand. Pt holding to PT arms for support. Requiring extended time to return to sitting.  Ambulation/Gait Ambulation/Gait assistance: Min assist;Mod assist   Assistive device: 1 person hand held assist       General Gait Details: Pt holding to PT arms for support. Was able to take a few steps at EOB, however, was limited secondary to incontinence. Pt requiring min to light mod A for steadying.  Stairs            Wheelchair Mobility    Modified Rankin (Stroke Patients Only)       Balance Overall balance assessment: Needs assistance Sitting-balance support: No upper extremity supported;Feet supported Sitting balance-Leahy Scale: Fair     Standing balance support: Bilateral upper extremity supported;During functional activity Standing balance-Leahy Scale: Poor Standing balance comment: Reliant on BUE support                             Pertinent Vitals/Pain      Home Living Family/patient expects to be discharged to:: Private residence Living Arrangements: Spouse/significant other Available Help at Discharge: Family Type of Home: House       Home Layout: Two level Home Equipment: Environmental consultant - 2 wheels      Prior Function Level of Independence: Independent with assistive device(s)         Comments: Was using RW for mobility     Hand Dominance   Dominant Hand: Right    Extremity/Trunk  Assessment   Upper Extremity Assessment Upper Extremity Assessment: Defer to OT evaluation    Lower Extremity Assessment Lower Extremity Assessment: Generalized weakness    Cervical / Trunk Assessment Cervical / Trunk Assessment: Kyphotic  Communication   Communication: No difficulties  Cognition Arousal/Alertness: Awake/alert Behavior During Therapy: WFL for tasks assessed/performed Overall Cognitive Status: Impaired/Different from baseline Area of  Impairment: Problem solving                             Problem Solving: Slow processing General Comments: Very tangential throughout and required cues to stay on task. Slow processing noted and required increased time to perform tasks.      General Comments      Exercises     Assessment/Plan    PT Assessment Patient needs continued PT services  PT Problem List Decreased strength;Decreased activity tolerance;Decreased balance;Decreased mobility;Decreased cognition;Decreased knowledge of use of DME;Decreased safety awareness;Decreased knowledge of precautions       PT Treatment Interventions DME instruction;Gait training;Stair training;Functional mobility training;Therapeutic activities;Therapeutic exercise;Balance training;Patient/family education;Cognitive remediation    PT Goals (Current goals can be found in the Care Plan section)  Acute Rehab PT Goals Patient Stated Goal: to go home PT Goal Formulation: With patient Time For Goal Achievement: 01/02/21 Potential to Achieve Goals: Good    Frequency Min 4X/week   Barriers to discharge        Co-evaluation               AM-PAC PT "6 Clicks" Mobility  Outcome Measure Help needed turning from your back to your side while in a flat bed without using bedrails?: A Little Help needed moving from lying on your back to sitting on the side of a flat bed without using bedrails?: A Lot Help needed moving to and from a bed to a chair (including a wheelchair)?: A Lot Help needed standing up from a chair using your arms (e.g., wheelchair or bedside chair)?: A Little Help needed to walk in hospital room?: A Lot Help needed climbing 3-5 steps with a railing? : Total 6 Click Score: 13    End of Session Equipment Utilized During Treatment: Gait belt Activity Tolerance: Patient tolerated treatment well Patient left: in bed;with call bell/phone within reach;with family/visitor present (NP present; on stretcher in  ED) Nurse Communication: Mobility status PT Visit Diagnosis: Unsteadiness on feet (R26.81);Muscle weakness (generalized) (M62.81)    Time: 2010-0712 PT Time Calculation (min) (ACUTE ONLY): 29 min   Charges:   PT Evaluation $PT Eval Moderate Complexity: 1 Mod PT Treatments $Therapeutic Activity: 8-22 mins        Lou Miner, DPT  Acute Rehabilitation Services  Pager: [phone] Office: [phone]   John Walton 12/19/2020, 4:50 PM

## 2020-12-19 NOTE — Progress Notes (Signed)
Carotid duplex has been completed.   Preliminary results in CV Proc.   John Walton 12/19/2020 9:52 AM

## 2020-12-19 NOTE — Progress Notes (Addendum)
STROKE TEAM PROGRESS NOTE   ATTENDING NOTE: I reviewed above note and agree with the assessment and plan. Pt was seen and examined.   87 year old male with history of hypertension, hyperlipidemia, diabetes, CAD, migraine, PVD, CKD, hypothyroidism, baseline anemia, TURP 9/15 with postop hematuria, right carotid stenosis 60 to 79% in 08/2020 admitted for right vision changes and left arm weakness and numbness.  CT no acute abnormality.  MRI showed right MCA scattered infarcts.  Carotid Doppler right ICA 80 to 99% stenosis.  EF 55 to 60%, MRA negative.  LDL 58, A1c 6.5.  WBC 11.4, however, hemoglobin 7.2-> 7.1 with baseline around 8.5, received 1 unit PRBC transfusion.  On exam, wife at bedside, patient lying in bed, initially sleeping, however easily arousable, awake alert orientated x3, mild dysarthria, however no aphasia, paucity of speech, follows simple commands, able to name and repeat.  No gaze palsy, visual fields full, mild left nasolabial fold flattening.  Slight left arm pronator drift, right arm proximal limited ROM due to chronic right shoulder injury.  Bilateral lower extremity equal strengths.  Sensation symmetrical, bilateral finger-to-nose mild dysmetria.  Gait not tested  Etiology for patient stroke likely due to right high-grade ICA stenosis.  Did not perform CTA head and neck due to elevated creatinine in the setting of CKD.  Had vascular surgery consultation from Dr. Scot Dock and he recommend right ICA for further stroke prevention.  Tentatively plan for Friday.  Given his severe anemia, recommend aspirin 325 monotherapy.  On Lipitor 40.  Stroke risk factor modification.  PT OT recommend CIR.  For detailed assessment and plan, please refer to above as I have made changes wherever appropriate.   Neurology will sign off. Please call with questions. Pt will follow up with stroke clinic NP at [facility] in about 4 weeks. Thanks for the consult.   Rosalin Hawking, MD PhD Stroke  Neurology 12/19/2020 7:01 PM    INTERVAL HISTORY His SO is at the bedside.  Patient awake, alert, NAD. Patient is HOH at baseline.  Vitals:   12/19/20 1400 12/19/20 1430 12/19/20 1457 12/19/20 1500  BP: (!) 147/63 (!) 121/52  (!) 128/55  Pulse: 94 82  83  Resp: (!) 22 15  14   Temp:   98 F (36.7 C)   TempSrc:   Oral   SpO2: 99% 97%  98%  Weight:      Height:       CBC:  Recent Labs  Lab 12/18/20 1944 12/18/20 1952 12/19/20 0742  WBC 11.4*  --  6.9  NEUTROABS 9.1*  --   --   HGB 7.2* 7.1* 7.6*  HCT 23.4* 21.0* 24.2*  MCV 90.3  --  90.6  PLT 380  --  937   Basic Metabolic Panel:  Recent Labs  Lab 12/18/20 1944 12/18/20 1952 12/19/20 0742  NA 136 138 138  K 4.9 4.6 4.1  CL 108 109 111  CO2 21*  --  19*  GLUCOSE 160* 159* 89  BUN 43* 42* 42*  CREATININE 2.45* 2.50* 2.29*  CALCIUM 8.4*  --  8.1*   Lipid Panel:  Recent Labs  Lab 12/19/20 0742  CHOL 114  TRIG 99  HDL 36*  CHOLHDL 3.2  VLDL 20  LDLCALC 58   HgbA1c:  Recent Labs  Lab 12/19/20 0742  HGBA1C 6.5*   Urine Drug Screen:  Recent Labs  Lab 12/18/20 1934  LABOPIA POSITIVE*  COCAINSCRNUR NONE DETECTED  LABBENZ NONE DETECTED  AMPHETMU NONE DETECTED  THCU NONE  DETECTED  LABBARB NONE DETECTED    Alcohol Level  Recent Labs  Lab 12/18/20 1944  ETH <10    IMAGING past 24 hours CT HEAD WO CONTRAST  Result Date: 12/18/2020 CLINICAL DATA:  TIA. EXAM: CT HEAD WITHOUT CONTRAST TECHNIQUE: Contiguous axial images were obtained from the base of the skull through the vertex without intravenous contrast. COMPARISON:  CT head 03/07/2020. FINDINGS: Brain: No evidence of acute infarction, hemorrhage, hydrocephalus, extra-axial collection or mass lesion/mass effect. There is stable mild diffuse atrophy. There is also stable mild periventricular white matter hypodensity, likely chronic small vessel ischemic change. 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  Vascular: Atherosclerotic calcifications are present within the cavernous internal carotid arteries. Skull: Normal. Negative for fracture or focal lesion. Sinuses/Orbits: No acute finding. Other: None. IMPRESSION: No acute intracranial abnormality. Electronically Signed   By: Ronney Asters M.D.   On: 12/18/2020 21:12   MR ANGIO HEAD WO CONTRAST  Result Date: 12/19/2020 CLINICAL DATA:  Stroke, follow-up EXAM: MRA HEAD WITHOUT CONTRAST TECHNIQUE: Angiographic images of the Circle of Willis were acquired using MRA technique without intravenous contrast. COMPARISON:  No pertinent prior exam. FINDINGS: Intracranial internal carotid arteries are patent. Middle and anterior cerebral arteries are patent. Intracranial vertebral arteries, basilar artery, posterior cerebral arteries are patent. Bilateral posterior communicating arteries are present. There is no significant stenosis or aneurysm. IMPRESSION: Normal MRA head. Electronically Signed   By: Macy Mis M.D.   On: 12/19/2020 12:32   MR BRAIN WO CONTRAST  Result Date: 12/19/2020 CLINICAL DATA:  Initial evaluation for neuro deficit, stroke suspected. EXAM: MRI HEAD WITHOUT CONTRAST TECHNIQUE: Multiplanar, multiecho pulse sequences of the brain and surrounding structures were obtained without intravenous contrast. COMPARISON:  CT from earlier the same day. FINDINGS: Brain: Generalized age-related cerebral atrophy. Patchy and confluent T2/FLAIR hyperintensity involving the periventricular deep white matter both cerebral hemispheres as well as the pons, most consistent with chronic small vessel ischemic disease moderate in nature. Encephalomalacia and gliosis involving the anterior right frontal region consistent with a chronic right MCA distribution infarct. Patchy multifocal areas of restricted diffusion seen involving the cortical gray matter of the right frontal, parietal, and occipital lobes, consistent with acute ischemic infarcts. These measure up to  approximately 1 cm in size, and are somewhat watershed in distribution. No associated hemorrhage or mass effect. No other evidence for acute or subacute ischemia. Gray-white matter differentiation otherwise maintained. Few scattered additional chronic micro hemorrhages noted involving the right greater than left cerebral hemispheres, likely small vessel/hypertensive in nature. No mass lesion, midline shift or mass effect. No hydrocephalus or extra-axial fluid collection. Pituitary gland suprasellar region within normal limits. Midline structures intact. Vascular: Major intracranial vascular flow voids are grossly maintained. Skull and upper cervical spine: Craniocervical junction within normal limits. Bone marrow signal intensity normal. No scalp soft tissue abnormality. Sinuses/Orbits: Prior bilateral ocular lens replacement. Mild scattered mucosal thickening noted within the ethmoidal air cells. Paranasal sinuses are otherwise clear. No significant mastoid effusion. Inner ear structures grossly normal. Other: None. IMPRESSION: 1. Patchy multifocal acute ischemic infarcts involving the cortical gray matter of the right frontal, parietal, and occipital lobes as above. No associated hemorrhage or mass effect. 2. 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 3. Underlying moderate chronic microvascular ischemic disease. Electronically Signed   By: Jeannine Boga M.D.   On: 12/19/2020 00:18   ECHOCARDIOGRAM COMPLETE  Result Date: 12/19/2020    ECHOCARDIOGRAM  REPORT   Patient Name:   AYRON FILLINGER Date of Exam: 12/19/2020 Medical Rec #:  [id number]       Height:       66.0 in Accession #:    [id number]      Weight:       143.3 lb Date of Birth:  [birth year]       BSA:          1.736 m Patient Age:    77 years        BP:           126/60 mmHg Patient Gender: M               HR:           57 bpm. Exam Location:  Inpatient Procedure: 2D Echo, Cardiac Doppler and Color Doppler  Indications:     Stroke  History:         Patient has prior history of Echocardiogram examinations, most                  recent 03/08/2020. Abnormal ECG, Arrythmias:RBBB; Risk                  Factors:Hypertension and Dyslipidemia.  Sonographer:     Roseanna Rainbow RDCS Referring Phys:  [id number] [employer] [facility] Diagnosing Phys: Fransico Him MD IMPRESSIONS  1. Left ventricular ejection fraction, by estimation, is 55 to 60%. The left ventricle has normal function. The left ventricle demonstrates regional wall motion abnormalities (see scoring diagram/findings for description). Left ventricular diastolic parameters were normal. There is hypokinesis of the left ventricular, apical inferior wall.  2. Right ventricular systolic function is normal. The right ventricular size is normal. Tricuspid regurgitation signal is inadequate for assessing PA pressure.  3. The mitral valve is normal in structure. Trivial mitral valve regurgitation. No evidence of mitral stenosis.  4. The aortic valve is calcified. There is severe calcifcation of the aortic valve. There is severe thickening of the aortic valve. Aortic valve regurgitation is trivial. Mild to moderate aortic valve stenosis. Aortic valve area, by VTI measures 1.93 cm. Aortic valve mean gradient measures 16.0 mmHg. Aortic valve Vmax measures 2.49 m/s.  5. Aortic dilatation noted. There is mild dilatation of the aortic root, measuring 42 mm. There is mild dilatation of the ascending aorta, measuring 41 mm. FINDINGS  Left Ventricle: Left ventricular ejection fraction, by estimation, is 55 to 60%. The left ventricle has normal function. The left ventricle demonstrates regional wall motion abnormalities. The left ventricular internal cavity size was normal in size. There is no left ventricular hypertrophy. Left ventricular diastolic parameters were normal. Normal left ventricular filling pressure. Right Ventricle: The right ventricular size is normal. No increase in right ventricular  wall thickness. Right ventricular systolic function is normal. Tricuspid regurgitation signal is inadequate for assessing PA pressure. Left Atrium: Left atrial size was normal in size. Right Atrium: Right atrial size was normal in size. Pericardium: There is no evidence of pericardial effusion. Mitral Valve: The mitral valve is normal in structure. Trivial mitral valve regurgitation. No evidence of mitral valve stenosis. MV peak gradient, 9.1 mmHg. The mean mitral valve gradient is 3.5 mmHg. Tricuspid Valve: The tricuspid valve is normal in structure. Tricuspid valve regurgitation is trivial. No evidence of tricuspid stenosis. Aortic Valve: The aortic valve is calcified. There is severe calcifcation of the aortic valve. There is severe thickening of the aortic valve. Aortic valve regurgitation is trivial. Mild to moderate  aortic stenosis is present. Aortic valve mean gradient measures 16.0 mmHg. Aortic valve peak gradient measures 24.8 mmHg. Aortic valve area, by VTI measures 1.93 cm. Pulmonic Valve: The pulmonic valve was normal in structure. Pulmonic valve regurgitation is mild. No evidence of pulmonic stenosis. Aorta: Aortic dilatation noted. There is mild dilatation of the aortic root, measuring 42 mm. There is mild dilatation of the ascending aorta, measuring 41 mm. Venous: The inferior vena cava was not well visualized. IAS/Shunts: No atrial level shunt detected by color flow Doppler.  LEFT VENTRICLE PLAX 2D LVIDd:         4.60 cm     Diastology LVIDs:         3.30 cm     LV e' medial:    8.49 cm/s LV PW:         1.00 cm     LV E/e' medial:  13.1 LV IVS:        1.00 cm     LV e' lateral:   9.79 cm/s LVOT diam:     2.30 cm     LV E/e' lateral: 11.3 LV SV:         106 LV SV Index:   61 LVOT Area:     4.15 cm  LV Volumes (MOD) LV vol d, MOD A2C: 63.4 ml LV vol d, MOD A4C: 89.9 ml LV vol s, MOD A2C: 41.3 ml LV vol s, MOD A4C: 44.5 ml LV SV MOD A2C:     22.1 ml LV SV MOD A4C:     89.9 ml LV SV MOD BP:      35.4  ml RIGHT VENTRICLE             IVC RV S prime:     11.10 cm/s  IVC diam: 1.70 cm TAPSE (M-mode): 2.5 cm LEFT ATRIUM             Index       RIGHT ATRIUM           Index LA diam:        3.30 cm 1.90 cm/m  RA Area:     12.00 cm LA Vol (A2C):   46.5 ml 26.79 ml/m RA Volume:   27.50 ml  15.84 ml/m LA Vol (A4C):   26.9 ml 15.50 ml/m LA Biplane Vol: 34.3 ml 19.76 ml/m  AORTIC VALVE                    PULMONIC VALVE AV Area (Vmax):    2.12 cm     PR End Diast Vel: 1.42 msec AV Area (Vmean):   1.82 cm AV Area (VTI):     1.93 cm AV Vmax:           249.00 cm/s AV Vmean:          189.000 cm/s AV VTI:            0.550 m AV Peak Grad:      24.8 mmHg AV Mean Grad:      16.0 mmHg LVOT Vmax:         127.00 cm/s LVOT Vmean:        82.600 cm/s LVOT VTI:          0.255 m LVOT/AV VTI ratio: 0.46  AORTA Ao Root diam: 4.20 cm Ao Asc diam:  4.10 cm MITRAL VALVE MV Area (PHT): 4.49 cm     SHUNTS MV Area VTI:   3.41 cm  Systemic VTI:  0.26 m MV Peak grad:  9.1 mmHg     Systemic Diam: 2.30 cm MV Mean grad:  3.5 mmHg MV Vmax:       1.51 m/s MV Vmean:      84.4 cm/s MV Decel Time: 169 msec MV E velocity: 111.00 cm/s MV A velocity: 133.00 cm/s MV E/A ratio:  0.83 Fransico Him MD Electronically signed by Fransico Him MD Signature Date/Time: 12/19/2020/11:22:31 AM    Final (Updated)    VAS US CAROTID  Result Date: 12/19/2020 Carotid Arterial Duplex Study Patient Name:  CIRE CLUTE  Date of Exam:   12/19/2020 Medical Rec #: [id number]        Accession #:    [id number] Date of Birth: 1932/10/15        Patient Gender: M Patient Age:   87 years Exam Location:  [facility] Procedure:      VAS US CAROTID Referring Phys: Lesleigh [name] --------------------------------------------------------------------------------  Indications:      CVA. Risk Factors:     Hypertension, hyperlipidemia, Diabetes, coronary artery                   disease. Comparison Study: 09/27/20 Performing Technologist: Archie Patten RVS  Examination  Guidelines: A complete evaluation includes B-mode imaging, spectral Doppler, color Doppler, and power Doppler as needed of all accessible portions of each vessel. Bilateral testing is considered an integral part of a complete examination. Limited examinations for reoccurring indications may be performed as noted.  Right Carotid Findings: +---------+--------+-------+--------+---------------------------------+--------+          PSV cm/sEDV    StenosisPlaque Description               Comments                  cm/s                                                     +---------+--------+-------+--------+---------------------------------+--------+ CCA Prox 72      13             heterogenous                              +---------+--------+-------+--------+---------------------------------+--------+ CCA      63      14             heterogenous                              Distal                                                                    +---------+--------+-------+--------+---------------------------------+--------+ ICA Prox 428     122    80-99%  heterogenous, calcific and  irregular                                 +---------+--------+-------+--------+---------------------------------+--------+ ICA Mid  327     72                                                       +---------+--------+-------+--------+---------------------------------+--------+ ICA      152     39                                                       Distal                                                                    +---------+--------+-------+--------+---------------------------------+--------+ ECA      114                                                              +---------+--------+-------+--------+---------------------------------+--------+ +----------+--------+-------+--------+-------------------+            PSV cm/sEDV cmsDescribeArm Pressure (mmHG) +----------+--------+-------+--------+-------------------+ [id number]                                        +----------+--------+-------+--------+-------------------+ +---------+--------+--+--------+--+---------+ VertebralPSV cm/s56EDV cm/s17Antegrade +---------+--------+--+--------+--+---------+  Left Carotid Findings: +----------+--------+--------+--------+------------------+--------+           PSV cm/sEDV cm/sStenosisPlaque DescriptionComments +----------+--------+--------+--------+------------------+--------+ CCA Prox  112     17              heterogenous               +----------+--------+--------+--------+------------------+--------+ CCA Distal115     18              heterogenous               +----------+--------+--------+--------+------------------+--------+ ICA Prox  120     24      1-39%   heterogenous               +----------+--------+--------+--------+------------------+--------+ ICA Distal74      21                                         +----------+--------+--------+--------+------------------+--------+ ECA       119                                                +----------+--------+--------+--------+------------------+--------+ +----------+--------+--------+--------+-------------------+  PSV cm/sEDV cm/sDescribeArm Pressure (mmHG) +----------+--------+--------+--------+-------------------+ [id number]                                          +----------+--------+--------+--------+-------------------+ +---------+--------+--+--------+--+---------+ VertebralPSV cm/s58EDV cm/s19Antegrade +---------+--------+--+--------+--+---------+   Summary: Right Carotid: Velocities in the right ICA are consistent with a 80-99%                stenosis. Left Carotid: Velocities in the left ICA are consistent with a 1-39% stenosis. Vertebrals: Bilateral vertebral arteries demonstrate  antegrade flow. *See table(s) above for measurements and observations.     Preliminary     PHYSICAL EXAM Physical Exam  Constitutional: appears nourished,  Psych: Affect appropriate to situation Eyes: Normal external eye and conjunctiva. HENT: Normocephalic, no lesions, without obvious abnormality.   Musculoskeletal-no joint tenderness, deformity or swelling Cardiovascular: Normal rate and regular rhythm.  Respiratory: Effort normal, non-labored breathing saturations WNL GI: Soft.  No distension. There is no tenderness.  Skin: WDI   Neuro:  Mental Status: Alert, oriented,name/place/situation. thought content appropriate.  Speech fluent without evidence of aphasia.  Able to follow commands without difficulty. Cranial Nerves: II:  Visual fields grossly normal,  III,IV, VI: ptosis not present, extra-ocular motions intact bilaterally pupils equal, round, reactive to light and accommodation V,VII: smile symmetric, facial light touch sensation normal bilaterally VIII: hearing normal bilaterally IX,X: uvula rises symmetrically XI: bilateral shoulder shrug XII: midline tongue extension Motor: Right : Upper extremity   4+/5 Left:     Upper extremity   4+/5  Lower extremity   4+/5  Lower extremity   4+/5 Tone and bulk:normal tone throughout; no atrophy noted Sensory:  light touch intact throughout, bilaterally Cerebellar: No ataxia noted Gait: deferred     ASSESSMENT/PLAN Mr. PRANEETH BUSSEY is a 87 y.o. male a 87 y.o. male with a past medical history significant for hypertension, hyperlipidemia, type 2 diabetes, known right carotid stenosis (60 to 79%) being managed conservatively, hypothyroidism, migraine headaches, coronary artery disease, aortic stenosis, peripheral vascular disease, chronic kidney disease, alcoholism in remission, remote tobacco use (quit 1970s), monoclonal gammopathy.  Stroke:  acute ischemic infarcts involving cortical gray matter and chronic Right MCA infarct  involving right frontal likely secondary to RICA stenosis.  CT head No acute abnormality.  MRI  1. Patchy multifocal acute ischemic infarcts involving the cortical gray matter of the right frontal, parietal, and occipital lobes as above. No associated hemorrhage or mass effect. 2. 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 3. Underlying moderate chronic microvascular ischemic disease. MRA  normal MRA 2D Echo pending Caroid [place]; RICA with 80-99% stenosis. LICA 2-99% stenosis LDL 58 HgbA1c 6.5 VTE prophylaxis - heparin    Diet   Diet Heart Room service appropriate? Yes; Fluid consistency: Thin   No antithrombotic prior to admission, now on aspirin 81 mg daily and clopidogrel 75 mg daily. Continue ASA and Plavix for 90 days. Then ASA alone.  Therapy recommendations:  pending Disposition:  pending  Hypertension Home meds:  none Stable Permissive hypertension (OK if < 220/120) but gradually normalize in 5-7 days Long-term BP goal normotensive  Hyperlipidemia Home meds:  atorvastatin 40 mg daily , resumed in hospital LDL 58, goal < 70 Continue statin at discharge  Diabetes type II Controlled Home meds:   HgbA1c 6.5, goal < 7.0 CBGs Recent Labs    12/19/20 1206  GLUCAP 109*  SSI  Other Stroke Risk Factors Advanced Age >/= 61  Coronary artery disease Migraines  Other Active Problems Carotid stenosis>> vascular surgery consult>> scheduled for Right carotid endarterectomy on 9/23  Hospital day # 0  Laurey Morale, MSN, NP-C [facility] [phone] .  To contact Stroke Continuity provider, please refer to [email or web address] After hours, contact General Neurology

## 2020-12-19 NOTE — Progress Notes (Signed)
  Echocardiogram 2D Echocardiogram has been performed.  Bobbye Charleston 12/19/2020, 10:11 AM

## 2020-12-19 NOTE — ED Notes (Signed)
RN called for status update on ready bed. IP charge reviewing now.

## 2020-12-19 NOTE — Progress Notes (Addendum)
PROGRESS NOTE        PATIENT DETAILS Name: John Walton Age: 87 y.o. Sex: male Date of Birth: 05/27/1932 Admit Date: 12/18/2020 Admitting Physician Etta Quill, DO HYI:FOYDXAJ, Fransico Him, MD  Brief Narrative: Patient is a 87 y.o. male with history of known right carotid artery stenosis, HTN, HLD, DM-2, CKD stage IV-recent TURP on 9/15-presenting with left eye blurry vision, left hand numbness/weakness and dysarthria.  She was found to have acute CVA and subsequently admitted to the hospitalist service.  See below for further details.    Subjective: Lying comfortably in bed-denies any chest pain or shortness of breath.  Blurry vision/dysarthria has resolved.  Objective: Vitals: Blood pressure 126/60, pulse 95, temperature 98.1 F (36.7 C), temperature source Oral, resp. rate 20, height 5\' 6"  (1.676 m), weight 65 kg, SpO2 97 %.   Exam: Gen Exam:Alert awake-not in any distress HEENT:atraumatic, normocephalic Chest: B/L clear to auscultation anteriorly CVS:S1S2 regular Abdomen:soft non tender, non distended Extremities:no edema Neurology: Very slight left-sided weakness. Skin: no rash  Pertinent Labs/Radiology: WBC:6.9 Hb:7.6 Na:138 K:4.1 Creatinine:2.29  Microbiology: 9/19>>Urine Culture:pending  Stroke work up: 9/19>> CT head: No acute intracranial abnormality. 9/19>> MRI brain: Patchy multifocal acute infarcts involving right frontal/parietal and occipital lobes. 9/20>> A1c: 6.5 9/20>> LDL: 58 9/20>> carotid Doppler: 80-99% stenosis on right, 1-39% on left.   9/20>> TTE: Pending  Assessment/Plan: Acute right frontal/parietal and occipital lobe stroke: Likely thromboembolic-due to known right carotid artery stenosis.  Slight left-sided weakness persists-but blurry vision and dysarthria have resolved.  Work-up as above-not on antiplatelets due to severe anemia and hematuria.  We will consult vascular surgery to determine appropriate  timing for carotid endarterectomy.  Acute blood loss anemia superimposed on anemia due to chronic disease/renal failure: Recent TURP with significant amount of hematuria that seems to be slowly getting better.  Transfused 1 unit of PRBC.  Will ask pharmacy to dose to 1 dose of Aranesp.  BPH-s/p TURP: Did have hematuria postoperatively-that per patient/spouse has improved markedly over the past few days-only a pink tinge to his urine.  Spoke with patient's primary urologist-Dr. Avelino Leeds to place on antiplatelet agent-okay to proceed carotid endarterectomy with intraoperative heparin if needed.  If in the unlikely event-patient developed significant hematuria-we will need a Foley catheter placed.   ?  UTI versus asymptomatic bacteriuria: Recent TURP-UA with significant amount of RBCs/WBCs-no symptoms of UTI-given 1 dose of IV Rocephin-await culture results.  AKI on CKD stage IV: Mild AKI likely due to hemodynamically mediated kidney injury in the setting of hematuria.  Creatinine improving-and trending back to baseline.  Avoid nephrotoxic agents.  DM-2: Appears to be diet controlled at home-placed on SSI and follow CBGs.  No results for input(s): GLUCAP in the last 72 hours.   HTN: BP stable-allow permissive hypertension.  HLD: On statin  Hyperthyroidism: On methimazole-awaiting TSH.  History of transient dysphagia due to severe esophageal spasm in the setting of allergy to Robitussin-PEG tube remains in place: Although patient able to consume orally without any issues.  Per spouse-PEG tube is used for occasional nutritional supplements  Procedures: None Consults: Neurology, vascular surgery DVT Prophylaxis: SQ Heparin Code Status:Full code or DNR Family Communication: None at bedside  Time spent: 35 minutes-Greater than 50% of this time was spent in counseling, explanation of diagnosis, planning of further management, and coordination of care.  Diet:  Diet Order              Diet Heart Room service appropriate? Yes; Fluid consistency: Thin  Diet effective now                     Disposition Plan: Status is: Inpatient  Remains inpatient appropriate because:Inpatient level of care appropriate due to severity of illness  Dispo: The patient is from: Home              Anticipated d/c is to: Home              Patient currently is not medically stable to d/c.   Difficult to place patient No  Barriers to Discharge: Acute CVA due to right carotid artery stenosis-severe anemia due to blood loss-needs inpatient optimization/neuro/vascular surgery evaluation before consideration of discharge.  Antimicrobial agents: Anti-infectives (From admission, onward)    Start     Dose/Rate Route Frequency Ordered Stop   12/18/20 2345  cefTRIAXone (ROCEPHIN) 1 g in sodium chloride 0.9 % 100 mL IVPB        1 g 200 mL/hr over 30 Minutes Intravenous  Once 12/18/20 2343 12/19/20 0120        MEDICATIONS: Scheduled Meds:  atorvastatin  40 mg Oral QHS   brinzolamide  1 drop Both Eyes BID   And   brimonidine  1 drop Both Eyes BID   calcium-vitamin D  1 tablet Oral TID with meals   collagenase  1 application Topical QODAY   feeding supplement (PROSource TF)  45 mL Per Tube BID   free water  120 mL Per Tube BID   latanoprost  1 drop Both Eyes QHS   Lifitegrast  1 drop Both Eyes BID   methimazole  10 mg Oral Q breakfast   polyvinyl alcohol  1 drop Both Eyes TID   sucralfate  1 g Oral TID AC   Continuous Infusions: PRN Meds:.acetaminophen **OR** acetaminophen (TYLENOL) oral liquid 160 mg/5 mL **OR** acetaminophen, HYDROcodone-acetaminophen   I have personally reviewed following labs and imaging studies  LABORATORY DATA: CBC: Recent Labs  Lab 12/18/20 1944 12/18/20 1952 12/19/20 0742  WBC 11.4*  --  6.9  NEUTROABS 9.1*  --   --   HGB 7.2* 7.1* 7.6*  HCT 23.4* 21.0* 24.2*  MCV 90.3  --  90.6  PLT 380  --  270    Basic Metabolic Panel: Recent Labs  Lab  12/14/20 1100 12/18/20 1944 12/18/20 1952 12/19/20 0742  NA 134* 136 138 138  K 4.2 4.9 4.6 4.1  CL 108 108 109 111  CO2 21* 21*  --  19*  GLUCOSE 112* 160* 159* 89  BUN 39* 43* 42* 42*  CREATININE 2.39* 2.45* 2.50* 2.29*  CALCIUM 8.6* 8.4*  --  8.1*    GFR: Estimated Creatinine Clearance: 20.9 mL/min (A) (by C-G formula based on SCr of 2.29 mg/dL (H)).  Liver Function Tests: Recent Labs  Lab 12/18/20 1944  AST 13*  ALT 8  ALKPHOS 83  BILITOT 0.1*  PROT 6.9  ALBUMIN 3.0*   No results for input(s): LIPASE, AMYLASE in the last 168 hours. No results for input(s): AMMONIA in the last 168 hours.  Coagulation Profile: Recent Labs  Lab 12/18/20 1944  INR 1.2    Cardiac Enzymes: No results for input(s): CKTOTAL, CKMB, CKMBINDEX, TROPONINI in the last 168 hours.  BNP (last 3 results) No results for input(s): PROBNP in the last 8760 hours.  Lipid Profile:  Recent Labs    12/19/20 0742  CHOL 114  HDL 36*  LDLCALC 58  TRIG 99  CHOLHDL 3.2    Thyroid Function Tests: No results for input(s): TSH, T4TOTAL, FREET4, T3FREE, THYROIDAB in the last 72 hours.  Anemia Panel: No results for input(s): VITAMINB12, FOLATE, FERRITIN, TIBC, IRON, RETICCTPCT in the last 72 hours.  Urine analysis:    Component Value Date/Time   COLORURINE YELLOW 12/18/2020 1934   APPEARANCEUR TURBID (A) 12/18/2020 1934   LABSPEC 1.025 12/18/2020 1934   PHURINE 5.5 12/18/2020 1934   GLUCOSEU NEGATIVE 12/18/2020 1934   GLUCOSEU 500 (A) 07/11/2014 0850   HGBUR LARGE (A) 12/18/2020 1934   BILIRUBINUR NEGATIVE 12/18/2020 1934   KETONESUR NEGATIVE 12/18/2020 1934   PROTEINUR >300 (A) 12/18/2020 1934   UROBILINOGEN 0.2 07/11/2014 0850   NITRITE NEGATIVE 12/18/2020 1934   LEUKOCYTESUR SMALL (A) 12/18/2020 1934    Sepsis Labs: Lactic Acid, Venous    Component Value Date/Time   LATICACIDVEN 1.9 07/16/2020 1601    MICROBIOLOGY: Recent Results (from the past 240 hour(s))  SARS  Coronavirus 2 (TAT 6-24 hrs)     Status: None   Collection Time: 12/12/20 12:00 AM  Result Value Ref Range Status   SARS Coronavirus 2 RESULT: NEGATIVE  Final    Comment: RESULT: NEGATIVESARS-CoV-2 INTERPRETATION:A NEGATIVE  test result means that SARS-CoV-2 RNA was not present in the specimen above the limit of detection of this test. This does not preclude a possible SARS-CoV-2 infection and should not be used as the  sole basis for patient management decisions. Negative results must be combined with clinical observations, patient history, and epidemiological information. Optimum specimen types and timing for peak viral levels during infections caused by SARS-CoV-2  have not been determined. Collection of multiple specimens or types of specimens may be necessary to detect virus. Improper specimen collection and handling, sequence variability under primers/probes, or organism present below the limit of detection may  lead to false negative results. Positive and negative predictive values of testing are highly dependent on prevalence. False negative test results are more likely when prevalence of disease is high.The expected result is NEGATIVE.Fact S heet for  Healthcare Providers: LocalChronicle.no Sheet for Patients: SalonLookup.es Reference Range - Negative   Surgical PCR screen     Status: None   Collection Time: 12/14/20 10:53 PM   Specimen: Nasal Mucosa; Nasal Swab  Result Value Ref Range Status   MRSA, PCR NEGATIVE NEGATIVE Final   Staphylococcus aureus NEGATIVE NEGATIVE Final    Comment: (NOTE) The Xpert SA Assay (FDA approved for NASAL specimens in patients 65 years of age and older), is one component of a comprehensive surveillance program. It is not intended to diagnose infection nor to guide or monitor treatment. Performed at [facility], [place] [address], [place],  [address]   Resp Panel by  RT-PCR (Flu A&B, Covid) Nasopharyngeal Swab     Status: None   Collection Time: 12/18/20  7:45 PM   Specimen: Nasopharyngeal Swab; Nasopharyngeal(NP) swabs in vial transport medium  Result Value Ref Range Status   SARS Coronavirus 2 by RT PCR NEGATIVE NEGATIVE Final    Comment: (NOTE) SARS-CoV-2 target nucleic acids are NOT DETECTED.  The SARS-CoV-2 RNA is generally detectable in upper respiratory specimens during the acute phase of infection. The lowest concentration of SARS-CoV-2 viral copies this assay can detect is 138 copies/mL. A negative result does not preclude SARS-Cov-2 infection and should not be used as the sole basis for  treatment or other patient management decisions. A negative result may occur with  improper specimen collection/handling, submission of specimen other than nasopharyngeal swab, presence of viral mutation(s) within the areas targeted by this assay, and inadequate number of viral copies(<138 copies/mL). A negative result must be combined with clinical observations, patient history, and epidemiological information. The expected result is Negative.  Fact Sheet for Patients:  EntrepreneurPulse.com.au  Fact Sheet for Healthcare Providers:  IncredibleEmployment.be  This test is no t yet approved or cleared by the [place] FDA and  has been authorized for detection and/or diagnosis of SARS-CoV-2 by FDA under an Emergency Use Authorization (EUA). This EUA will remain  in effect (meaning this test can be used) for the duration of the COVID-19 declaration under Section 564(b)(1) of the Act, 21 U.S.C.section 360bbb-3(b)(1), unless the authorization is terminated  or revoked sooner.       Influenza A by PCR NEGATIVE NEGATIVE Final   Influenza B by PCR NEGATIVE NEGATIVE Final    Comment: (NOTE) The Xpert Xpress SARS-CoV-2/FLU/RSV plus assay is intended as an aid in the diagnosis of influenza from Nasopharyngeal swab  specimens and should not be used as a sole basis for treatment. Nasal washings and aspirates are unacceptable for Xpert Xpress SARS-CoV-2/FLU/RSV testing.  Fact Sheet for Patients: EntrepreneurPulse.com.au  Fact Sheet for Healthcare Providers: IncredibleEmployment.be  This test is not yet approved or cleared by the [place] FDA and has been authorized for detection and/or diagnosis of SARS-CoV-2 by FDA under an Emergency Use Authorization (EUA). This EUA will remain in effect (meaning this test can be used) for the duration of the COVID-19 declaration under Section 564(b)(1) of the Act, 21 U.S.C. section 360bbb-3(b)(1), unless the authorization is terminated or revoked.  Performed at [facility], [place] [address], [place], [place] [address]     RADIOLOGY STUDIES/RESULTS: CT HEAD WO CONTRAST  Result Date: 12/18/2020 CLINICAL DATA:  TIA. EXAM: CT HEAD WITHOUT CONTRAST TECHNIQUE: Contiguous axial images were obtained from the base of the skull through the vertex without intravenous contrast. COMPARISON:  CT head 03/07/2020. FINDINGS: Brain: No evidence of acute infarction, hemorrhage, hydrocephalus, extra-axial collection or mass lesion/mass effect. There is stable mild diffuse atrophy. There is also stable mild periventricular white matter hypodensity, likely chronic small vessel ischemic change. 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 Vascular: Atherosclerotic calcifications are present within the cavernous internal carotid arteries. Skull: Normal. Negative for fracture or focal lesion. Sinuses/Orbits: No acute finding. Other: None. IMPRESSION: No acute intracranial abnormality. Electronically Signed   By: Ronney Asters M.D.   On: 12/18/2020 21:12   MR BRAIN WO CONTRAST  Result Date: 12/19/2020 CLINICAL DATA:  Initial evaluation for neuro deficit, stroke suspected. EXAM: MRI HEAD WITHOUT CONTRAST  TECHNIQUE: Multiplanar, multiecho pulse sequences of the brain and surrounding structures were obtained without intravenous contrast. COMPARISON:  CT from earlier the same day. FINDINGS: Brain: Generalized age-related cerebral atrophy. Patchy and confluent T2/FLAIR hyperintensity involving the periventricular deep white matter both cerebral hemispheres as well as the pons, most consistent with chronic small vessel ischemic disease moderate in nature. Encephalomalacia and gliosis involving the anterior right frontal region consistent with a chronic right MCA distribution infarct. Patchy multifocal areas of restricted diffusion seen involving the cortical gray matter of the right frontal, parietal, and occipital lobes, consistent with acute ischemic infarcts. These measure up to approximately 1 cm in size, and are somewhat watershed in distribution. No associated hemorrhage or  mass effect. No other evidence for acute or subacute ischemia. Gray-white matter differentiation otherwise maintained. Few scattered additional chronic micro hemorrhages noted involving the right greater than left cerebral hemispheres, likely small vessel/hypertensive in nature. No mass lesion, midline shift or mass effect. No hydrocephalus or extra-axial fluid collection. Pituitary gland suprasellar region within normal limits. Midline structures intact. Vascular: Major intracranial vascular flow voids are grossly maintained. Skull and upper cervical spine: Craniocervical junction within normal limits. Bone marrow signal intensity normal. No scalp soft tissue abnormality. Sinuses/Orbits: Prior bilateral ocular lens replacement. Mild scattered mucosal thickening noted within the ethmoidal air cells. Paranasal sinuses are otherwise clear. No significant mastoid effusion. Inner ear structures grossly normal. Other: None. IMPRESSION: 1. Patchy multifocal acute ischemic infarcts involving the cortical gray matter of the right frontal, parietal, and  occipital lobes as above. No associated hemorrhage or mass effect. 2. 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 3. Underlying moderate chronic microvascular ischemic disease. Electronically Signed   By: Jeannine Boga M.D.   On: 12/19/2020 00:18   VAS US CAROTID  Result Date: 12/19/2020 Carotid Arterial Duplex Study Patient Name:  DEQUANDRE CORDOVA  Date of Exam:   12/19/2020 Medical Rec #: [id number]        Accession #:    [id number] Date of Birth: Jan 11, 1933        Patient Gender: M Patient Age:   59 years Exam Location:  [facility] Procedure:      VAS US CAROTID Referring Phys: Lesleigh [name] --------------------------------------------------------------------------------  Indications:      CVA. Risk Factors:     Hypertension, hyperlipidemia, Diabetes, coronary artery                   disease. Comparison Study: 09/27/20 Performing Technologist: Archie Patten RVS  Examination Guidelines: A complete evaluation includes B-mode imaging, spectral Doppler, color Doppler, and power Doppler as needed of all accessible portions of each vessel. Bilateral testing is considered an integral part of a complete examination. Limited examinations for reoccurring indications may be performed as noted.  Right Carotid Findings: +---------+--------+-------+--------+---------------------------------+--------+          PSV cm/sEDV    StenosisPlaque Description               Comments                  cm/s                                                     +---------+--------+-------+--------+---------------------------------+--------+ CCA Prox 72      13             heterogenous                              +---------+--------+-------+--------+---------------------------------+--------+ CCA      63      14             heterogenous                              Distal                                                                     +---------+--------+-------+--------+---------------------------------+--------+  ICA Prox 428     122    80-99%  heterogenous, calcific and                                                irregular                                 +---------+--------+-------+--------+---------------------------------+--------+ ICA Mid  327     72                                                       +---------+--------+-------+--------+---------------------------------+--------+ ICA      152     39                                                       Distal                                                                    +---------+--------+-------+--------+---------------------------------+--------+ ECA      114                                                              +---------+--------+-------+--------+---------------------------------+--------+ +----------+--------+-------+--------+-------------------+           PSV cm/sEDV cmsDescribeArm Pressure (mmHG) +----------+--------+-------+--------+-------------------+ [id number]                                        +----------+--------+-------+--------+-------------------+ +---------+--------+--+--------+--+---------+ VertebralPSV cm/s56EDV cm/s17Antegrade +---------+--------+--+--------+--+---------+  Left Carotid Findings: +----------+--------+--------+--------+------------------+--------+           PSV cm/sEDV cm/sStenosisPlaque DescriptionComments +----------+--------+--------+--------+------------------+--------+ CCA Prox  112     17              heterogenous               +----------+--------+--------+--------+------------------+--------+ CCA Distal115     18              heterogenous               +----------+--------+--------+--------+------------------+--------+ ICA Prox  120     24      1-39%   heterogenous                +----------+--------+--------+--------+------------------+--------+ ICA Distal74      21                                         +----------+--------+--------+--------+------------------+--------+  ECA       119                                                +----------+--------+--------+--------+------------------+--------+ +----------+--------+--------+--------+-------------------+           PSV cm/sEDV cm/sDescribeArm Pressure (mmHG) +----------+--------+--------+--------+-------------------+ [id number]                                          +----------+--------+--------+--------+-------------------+ +---------+--------+--+--------+--+---------+ VertebralPSV cm/s58EDV cm/s19Antegrade +---------+--------+--+--------+--+---------+   Summary: Right Carotid: Velocities in the right ICA are consistent with a 80-99%                stenosis. Left Carotid: Velocities in the left ICA are consistent with a 1-39% stenosis. Vertebrals: Bilateral vertebral arteries demonstrate antegrade flow. *See table(s) above for measurements and observations.     Preliminary      LOS: 0 days   Oren Binet, MD  [facility]    To contact the attending provider between 7A-7P or the covering provider during after hours 7P-7A, please log into the web site [email or web address] and access using universal [place] password for that web site. If you do not have the password, please call the hospital operator.  12/19/2020, 10:51 AM

## 2020-12-19 NOTE — ED Provider Notes (Signed)
I assumed care of this patient.  Please see previous provider note for further details of Hx, PE.  Briefly patient is a 87 y.o. male who presented for evaluation of left-sided visual changes, slurred speech, and left upper extremity weakness/numbness.  Symptom onset earlier this morning.  Patient seen by eye doctor and sent here to rule out stroke.  Symptoms have apparently resolved.  History of known right-sided carotid stenosis followed by vascular surgery. Currently pending MRI.   MRI shows multiple embolic strokes in the right hemisphere. Consulted neurology who will follow along. Since patient is already being followed by vascular surgery and has known carotid stenosis, they will need to be consulted in the morning. Patient admitted to medicine for further work-up and management.   Fatima Blank, MD 12/19/20 Shelah Lewandowsky

## 2020-12-19 NOTE — ED Notes (Signed)
ED TO INPATIENT HANDOFF REPORT  ED Nurse Name and Phone #: Bryson Ha [phone]  S Name/Age/Gender [name] 87 y.o. male Room/Bed: 027C/027C  Code Status   Code Status: Full Code  Home/SNF/Other Home Patient oriented to: self, place, time, and situation Is this baseline? Yes   Triage Complete: Triage complete  Chief Complaint Acute ischemic stroke [facility]) [I63.9]  Triage Note Pt sent here by eye dr for brain MRI. Per pt around 1000 he noticed his speech wasn't clear, and he had some numbness in the L hand. Symptoms have resolved, denies pain. Pt has no unilateral weakness.   Allergies Allergies  Allergen Reactions   Other Swelling    ALL COUGH MEDICATIONS   Robitussin Cold Cough+ Chest [Dextromethorphan-Guaifenesin] Swelling    Laryngeal edema - any cough syrup    Level of Care/Admitting Diagnosis ED Disposition     ED Disposition  Admit   Condition  --   Comment  Hospital Area: [place] [[id number]]  Level of Care: Telemetry Medical [104]  May admit patient to [facility] or [facility] if equivalent level of care is available:: No  Covid Evaluation: Asymptomatic Screening Protocol (No Symptoms)  Diagnosis: Acute ischemic stroke [facility]) [[id number]]  Admitting Physician: Etta Quill [phone]  Attending Physician: Etta Quill [phone]  Estimated length of stay: past midnight tomorrow  Certification:: I certify this patient will need inpatient services for at least 2 midnights          B Medical/Surgery History Past Medical History:  Diagnosis Date   Anemia    Chicken pox    Chronic kidney disease    Coronary artery disease    Diabetes mellitus without complication ([place])    Frequent headaches    Glaucoma    Hay fever    History of blood transfusion    HTN (hypertension)    Hyperlipidemia    Hyperlipidemia    Hypothyroidism    Migraines    Peripheral vascular disease ([place])    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[facility])    Past  Surgical History:  Procedure Laterality Date   CHOLECYSTECTOMY, LAPAROSCOPIC     IR GASTROSTOMY TUBE MOD SED  03/29/2020   TONSILLECTOMY  [birth year]/1940   TOTAL HIP ARTHROPLASTY Left 03/08/2020   Procedure: TOTAL HIP ARTHROPLASTY;  Surgeon: Marybelle Killings, MD;  Location: [facility];  Service: Orthopedics;  Laterality: Left;   TRANSURETHRAL RESECTION OF PROSTATE N/A 12/14/2020   Procedure: TRANSURETHRAL RESECTION OF THE PROSTATE (TURP);  Surgeon: Franchot Gallo, MD;  Location: [facility];  Service: Urology;  Laterality: N/A;  2 HRS     A IV Location/Drains/Wounds Patient Lines/Drains/Airways Status     Active Line/Drains/Airways     Name Placement date Placement time Site Days   Peripheral IV 12/18/20 20 G 1" Anterior;Proximal;Right Forearm 12/18/20  2213  Forearm  1   Gastrostomy/Enterostomy Gastrostomy 20 Fr. LUQ 03/29/20  1223  LUQ  265   External Urinary Catheter 12/19/20  0901  --  less than 1   Wound / Incision (Open or Dehisced) 03/26/20 Other (Comment) Foot Right;Lateral;Posterior eschar 03/26/20  2300  Foot  268            Intake/Output Last 24 hours  Intake/Output Summary (Last 24 hours) at 12/19/2020 1458 Last data filed at 12/19/2020 0526 Gross per 24 hour  Intake 1075.17 ml  Output --  Net 1075.17 ml    Labs/Imaging Results for orders placed or performed during the hospital encounter of 12/18/20 (  from the past 48 hour(s))  Urine rapid drug screen (hosp performed)     Status: Abnormal   Collection Time: 12/18/20  7:34 PM  Result Value Ref Range   Opiates POSITIVE (A) NONE DETECTED   Cocaine NONE DETECTED NONE DETECTED   Benzodiazepines NONE DETECTED NONE DETECTED   Amphetamines NONE DETECTED NONE DETECTED   Tetrahydrocannabinol NONE DETECTED NONE DETECTED   Barbiturates NONE DETECTED NONE DETECTED    Comment: (NOTE) DRUG SCREEN FOR MEDICAL PURPOSES ONLY.  IF CONFIRMATION IS NEEDED FOR ANY PURPOSE, NOTIFY LAB WITHIN 5 DAYS.  LOWEST DETECTABLE LIMITS FOR URINE  DRUG SCREEN Drug Class                     Cutoff (ng/mL) Amphetamine and metabolites    1000 Barbiturate and metabolites    200 Benzodiazepine                 962 Tricyclics and metabolites     300 Opiates and metabolites        300 Cocaine and metabolites        300 THC                            50 Performed at [facility], [place] [address], [place], [place] [address]   Urinalysis, Routine w reflex microscopic Urine, Clean Catch     Status: Abnormal   Collection Time: 12/18/20  7:34 PM  Result Value Ref Range   Color, Urine YELLOW YELLOW   APPearance TURBID (A) CLEAR   Specific Gravity, Urine 1.025 1.005 - 1.030   pH 5.5 5.0 - 8.0   Glucose, UA NEGATIVE NEGATIVE mg/dL   Hgb urine dipstick LARGE (A) NEGATIVE   Bilirubin Urine NEGATIVE NEGATIVE   Ketones, ur NEGATIVE NEGATIVE mg/dL   Protein, ur >300 (A) NEGATIVE mg/dL   Nitrite NEGATIVE NEGATIVE   Leukocytes,Ua SMALL (A) NEGATIVE    Comment: Performed at [facility], [place] [address], [place], [place] [address]  Urinalysis, Microscopic (reflex)     Status: Abnormal   Collection Time: 12/18/20  7:34 PM  Result Value Ref Range   RBC / HPF >50 0 - 5 RBC/hpf   WBC, UA 21-50 0 - 5 WBC/hpf   Bacteria, UA FEW (A) NONE SEEN   Squamous Epithelial / LPF 0-5 0 - 5   WBC Clumps PRESENT    Mucus PRESENT    Amorphous Crystal PRESENT     Comment: Performed at [facility], [place] [address], [place], [place] [address]  Ethanol     Status: None   Collection Time: 12/18/20  7:44 PM  Result Value Ref Range   Alcohol, Ethyl (B) <10 <10 mg/dL    Comment: (NOTE) Lowest detectable limit for serum alcohol is 10 mg/dL.  For medical purposes only. Performed at [facility], [place] [address] [place], [place] [address]   Protime-INR     Status: None   Collection Time: 12/18/20  7:44 PM  Result Value Ref Range   Prothrombin Time 15.0 11.4 - 15.2 seconds   INR 1.2 0.8 - 1.2    Comment: (NOTE) INR goal varies based on  device and disease states. Performed at [facility], [place] [address], [place], [place] [address]   APTT     Status: None   Collection Time: 12/18/20  7:44 PM  Result Value Ref Range  aPTT 35 24 - 36 seconds    Comment: Performed at [facility], [place] [address], [place], [place] [address]  CBC     Status: Abnormal   Collection Time: 12/18/20  7:44 PM  Result Value Ref Range   WBC 11.4 (H) 4.0 - 10.5 K/uL   RBC 2.59 (L) 4.22 - 5.81 MIL/uL   Hemoglobin 7.2 (L) 13.0 - 17.0 g/dL   HCT 23.4 (L) 39.0 - 52.0 %   MCV 90.3 80.0 - 100.0 fL   MCH 27.8 26.0 - 34.0 pg   MCHC 30.8 30.0 - 36.0 g/dL   RDW 14.1 11.5 - 15.5 %   Platelets 380 150 - 400 K/uL   nRBC 0.0 0.0 - 0.2 %    Comment: Performed at [facility], [place] [address], [place], [place] [address]  Differential     Status: Abnormal   Collection Time: 12/18/20  7:44 PM  Result Value Ref Range   Neutrophils Relative % 80 %   Neutro Abs 9.1 (H) 1.7 - 7.7 K/uL   Lymphocytes Relative 8 %   Lymphs Abs 1.0 0.7 - 4.0 K/uL   Monocytes Relative 10 %   Monocytes Absolute 1.1 (H) 0.1 - 1.0 K/uL   Eosinophils Relative 1 %   Eosinophils Absolute 0.1 0.0 - 0.5 K/uL   Basophils Relative 0 %   Basophils Absolute 0.0 0.0 - 0.1 K/uL   Immature Granulocytes 1 %   Abs Immature Granulocytes 0.10 (H) 0.00 - 0.07 K/uL    Comment: Performed at [facility] [address] [place], [place] [address]  Comprehensive metabolic panel     Status: Abnormal   Collection Time: 12/18/20  7:44 PM  Result Value Ref Range   Sodium 136 135 - 145 mmol/L   Potassium 4.9 3.5 - 5.1 mmol/L   Chloride 108 98 - 111 mmol/L   CO2 21 (L) 22 - 32 mmol/L   Glucose, Bld 160 (H) 70 - 99 mg/dL    Comment: Glucose reference range applies only to samples taken after fasting for at least 8 hours.   BUN 43 (H) 8 - 23 mg/dL   Creatinine, Ser 2.45 (H) 0.61 - 1.24 mg/dL   Calcium 8.4 (L) 8.9 - 10.3 mg/dL   Total Protein 6.9 6.5 - 8.1 g/dL   Albumin 3.0 (L)  3.5 - 5.0 g/dL   AST 13 (L) 15 - 41 U/L   ALT 8 0 - 44 U/L   Alkaline Phosphatase 83 38 - 126 U/L   Total Bilirubin 0.1 (L) 0.3 - 1.2 mg/dL   GFR, Estimated 25 (L) >60 mL/min    Comment: (NOTE) Calculated using the CKD-EPI Creatinine Equation (2021)    Anion gap 7 5 - 15    Comment: Performed at [facility], [place] [address], [place], [place] [address]  Resp Panel by RT-PCR (Flu A&B, Covid) Nasopharyngeal Swab     Status: None   Collection Time: 12/18/20  7:45 PM   Specimen: Nasopharyngeal Swab; Nasopharyngeal(NP) swabs in vial transport medium  Result Value Ref Range   SARS Coronavirus 2 by RT PCR NEGATIVE NEGATIVE    Comment: (NOTE) SARS-CoV-2 target nucleic acids are NOT DETECTED.  The SARS-CoV-2 RNA is generally detectable in upper respiratory specimens during the acute phase of infection. The lowest concentration of SARS-CoV-2 viral copies this assay can detect is 138 copies/mL. A negative result does not preclude SARS-Cov-2 infection and should not be used as the  sole basis for treatment or other patient management decisions. A negative result may occur with  improper specimen collection/handling, submission of specimen other than nasopharyngeal swab, presence of viral mutation(s) within the areas targeted by this assay, and inadequate number of viral copies(<138 copies/mL). A negative result must be combined with clinical observations, patient history, and epidemiological information. The expected result is Negative.  Fact Sheet for Patients:  EntrepreneurPulse.com.au  Fact Sheet for Healthcare Providers:  IncredibleEmployment.be  This test is no t yet approved or cleared by the [place] FDA and  has been authorized for detection and/or diagnosis of SARS-CoV-2 by FDA under an Emergency Use Authorization (EUA). This EUA will remain  in effect (meaning this test can be used) for the duration of the COVID-19 declaration under  Section 564(b)(1) of the Act, 21 U.S.C.section 360bbb-3(b)(1), unless the authorization is terminated  or revoked sooner.       Influenza A by PCR NEGATIVE NEGATIVE   Influenza B by PCR NEGATIVE NEGATIVE    Comment: (NOTE) The Xpert Xpress SARS-CoV-2/FLU/RSV plus assay is intended as an aid in the diagnosis of influenza from Nasopharyngeal swab specimens and should not be used as a sole basis for treatment. Nasal washings and aspirates are unacceptable for Xpert Xpress SARS-CoV-2/FLU/RSV testing.  Fact Sheet for Patients: EntrepreneurPulse.com.au  Fact Sheet for Healthcare Providers: IncredibleEmployment.be  This test is not yet approved or cleared by the [place] FDA and has been authorized for detection and/or diagnosis of SARS-CoV-2 by FDA under an Emergency Use Authorization (EUA). This EUA will remain in effect (meaning this test can be used) for the duration of the COVID-19 declaration under Section 564(b)(1) of the Act, 21 U.S.C. section 360bbb-3(b)(1), unless the authorization is terminated or revoked.  Performed at [facility], [place] [address], [place], [place] [address]   I-stat chem 8, ED     Status: Abnormal   Collection Time: 12/18/20  7:52 PM  Result Value Ref Range   Sodium 138 135 - 145 mmol/L   Potassium 4.6 3.5 - 5.1 mmol/L   Chloride 109 98 - 111 mmol/L   BUN 42 (H) 8 - 23 mg/dL   Creatinine, Ser 2.50 (H) 0.61 - 1.24 mg/dL   Glucose, Bld 159 (H) 70 - 99 mg/dL    Comment: Glucose reference range applies only to samples taken after fasting for at least 8 hours.   Calcium, Ion 1.18 1.15 - 1.40 mmol/L   TCO2 20 (L) 22 - 32 mmol/L   Hemoglobin 7.1 (L) 13.0 - 17.0 g/dL   HCT 21.0 (L) 39.0 - 52.0 %  Prepare RBC (crossmatch)     Status: None   Collection Time: 12/19/20  1:27 AM  Result Value Ref Range   Order Confirmation      ORDER PROCESSED BY BLOOD BANK Performed at [facility], [place] [address],  [place],  [address]   Type and screen [place]     Status: None (Preliminary result)   Collection Time: 12/19/20  1:43 AM  Result Value Ref Range   ABO/RH(D) A POS    Antibody Screen NEG    Sample Expiration 12/22/2020,2359    Unit Number [id number]    Blood Component Type RED CELLS,LR    Unit division 00    Status of Unit ISSUED    Transfusion Status OK TO TRANSFUSE    Crossmatch Result      Compatible Performed at [facility], [place] [address],  [place], [place] [address]   CBC     Status: Abnormal   Collection Time: 12/19/20  7:42 AM  Result Value Ref Range   WBC 6.9 4.0 - 10.5 K/uL   RBC 2.67 (L) 4.22 - 5.81 MIL/uL   Hemoglobin 7.6 (L) 13.0 - 17.0 g/dL   HCT 24.2 (L) 39.0 - 52.0 %   MCV 90.6 80.0 - 100.0 fL   MCH 28.5 26.0 - 34.0 pg   MCHC 31.4 30.0 - 36.0 g/dL   RDW 14.0 11.5 - 15.5 %   Platelets 277 150 - 400 K/uL   nRBC 0.0 0.0 - 0.2 %    Comment: Performed at [facility], [place] [address] [place], [place] [address]  Hemoglobin A1c     Status: Abnormal   Collection Time: 12/19/20  7:42 AM  Result Value Ref Range   Hgb A1c MFr Bld 6.5 (H) 4.8 - 5.6 %    Comment: (NOTE) Pre diabetes:          5.7%-6.4%  Diabetes:              >6.4%  Glycemic control for   <7.0% adults with diabetes    Mean Plasma Glucose 139.85 mg/dL    Comment: Performed at [facility] [address] [place], [place] [address]  Lipid panel     Status: Abnormal   Collection Time: 12/19/20  7:42 AM  Result Value Ref Range   Cholesterol 114 0 - 200 mg/dL   Triglycerides 99 <150 mg/dL   HDL 36 (L) >40 mg/dL   Total CHOL/HDL Ratio 3.2 RATIO   VLDL 20 0 - 40 mg/dL   LDL Cholesterol 58 0 - 99 mg/dL    Comment:        Total Cholesterol/HDL:CHD Risk Coronary Heart Disease Risk Table                     Men   Women  1/2 Average Risk   3.4   3.3  Average Risk       5.0   4.4  2 X Average Risk   9.6   7.1  3 X Average Risk  23.4   11.0        Use the  calculated Patient Ratio above and the CHD Risk Table to determine the patient's CHD Risk.        ATP III CLASSIFICATION (LDL):  <100     mg/dL   Optimal  100-129  mg/dL   Near or Above                    Optimal  130-159  mg/dL   Borderline  160-189  mg/dL   High  >190     mg/dL   Very High Performed at [facility] [address] [place], [place] [address]   Basic metabolic panel     Status: Abnormal   Collection Time: 12/19/20  7:42 AM  Result Value Ref Range   Sodium 138 135 - 145 mmol/L   Potassium 4.1 3.5 - 5.1 mmol/L   Chloride 111 98 - 111 mmol/L   CO2 19 (L) 22 - 32 mmol/L   Glucose, Bld 89 70 - 99 mg/dL    Comment: Glucose reference range applies only to samples taken after fasting for at least 8 hours.   BUN 42 (H) 8 - 23 mg/dL   Creatinine, Ser 2.29 (H) 0.61 - 1.24 mg/dL  Calcium 8.1 (L) 8.9 - 10.3 mg/dL   GFR, Estimated 27 (L) >60 mL/min    Comment: (NOTE) Calculated using the CKD-EPI Creatinine Equation (2021)    Anion gap 8 5 - 15    Comment: Performed at [facility], [place] [address], [place], [place] [address]  CBG monitoring, ED     Status: Abnormal   Collection Time: 12/19/20 12:06 PM  Result Value Ref Range   Glucose-Capillary 109 (H) 70 - 99 mg/dL    Comment: Glucose reference range applies only to samples taken after fasting for at least 8 hours.   CT HEAD WO CONTRAST  Result Date: 12/18/2020 CLINICAL DATA:  TIA. EXAM: CT HEAD WITHOUT CONTRAST TECHNIQUE: Contiguous axial images were obtained from the base of the skull through the vertex without intravenous contrast. COMPARISON:  CT head 03/07/2020. FINDINGS: Brain: No evidence of acute infarction, hemorrhage, hydrocephalus, extra-axial collection or mass lesion/mass effect. There is stable mild diffuse atrophy. There is also stable mild periventricular white matter hypodensity, likely chronic small vessel ischemic change. There is a small old cortical infarct in the right frontal lobe which  is unchanged. Vascular: Atherosclerotic calcifications are present within the cavernous internal carotid arteries. Skull: Normal. Negative for fracture or focal lesion. Sinuses/Orbits: No acute finding. Other: None. IMPRESSION: No acute intracranial abnormality. Electronically Signed   By: Ronney Asters M.D.   On: 12/18/2020 21:12   MR ANGIO HEAD WO CONTRAST  Result Date: 12/19/2020 CLINICAL DATA:  Stroke, follow-up EXAM: MRA HEAD WITHOUT CONTRAST TECHNIQUE: Angiographic images of the Circle of Willis were acquired using MRA technique without intravenous contrast. COMPARISON:  No pertinent prior exam. FINDINGS: Intracranial internal carotid arteries are patent. Middle and anterior cerebral arteries are patent. Intracranial vertebral arteries, basilar artery, posterior cerebral arteries are patent. Bilateral posterior communicating arteries are present. There is no significant stenosis or aneurysm. IMPRESSION: Normal MRA head. Electronically Signed   By: Macy Mis M.D.   On: 12/19/2020 12:32   MR BRAIN WO CONTRAST  Result Date: 12/19/2020 CLINICAL DATA:  Initial evaluation for neuro deficit, stroke suspected. EXAM: MRI HEAD WITHOUT CONTRAST TECHNIQUE: Multiplanar, multiecho pulse sequences of the brain and surrounding structures were obtained without intravenous contrast. COMPARISON:  CT from earlier the same day. FINDINGS: Brain: Generalized age-related cerebral atrophy. Patchy and confluent T2/FLAIR hyperintensity involving the periventricular deep white matter both cerebral hemispheres as well as the pons, most consistent with chronic small vessel ischemic disease moderate in nature. Encephalomalacia and gliosis involving the anterior right frontal region consistent with a chronic right MCA distribution infarct. Patchy multifocal areas of restricted diffusion seen involving the cortical gray matter of the right frontal, parietal, and occipital lobes, consistent with acute ischemic infarcts. These  measure up to approximately 1 cm in size, and are somewhat watershed in distribution. No associated hemorrhage or mass effect. No other evidence for acute or subacute ischemia. Gray-white matter differentiation otherwise maintained. Few scattered additional chronic micro hemorrhages noted involving the right greater than left cerebral hemispheres, likely small vessel/hypertensive in nature. No mass lesion, midline shift or mass effect. No hydrocephalus or extra-axial fluid collection. Pituitary gland suprasellar region within normal limits. Midline structures intact. Vascular: Major intracranial vascular flow voids are grossly maintained. Skull and upper cervical spine: Craniocervical junction within normal limits. Bone marrow signal intensity normal. No scalp soft tissue abnormality. Sinuses/Orbits: Prior bilateral ocular lens replacement. Mild scattered mucosal thickening noted within the ethmoidal air cells. Paranasal sinuses are otherwise clear. No significant mastoid effusion. [place]  ear structures grossly normal. Other: None. IMPRESSION: 1. Patchy multifocal acute ischemic infarcts involving the cortical gray matter of the right frontal, parietal, and occipital lobes as above. No associated hemorrhage or mass effect. 2. 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 3. Underlying moderate chronic microvascular ischemic disease. Electronically Signed   By: Jeannine Boga M.D.   On: 12/19/2020 00:18   ECHOCARDIOGRAM COMPLETE  Result Date: 12/19/2020    ECHOCARDIOGRAM REPORT   Patient Name:   BRECKAN CAFIERO Date of Exam: 12/19/2020 Medical Rec #:  [id number]       Height:       66.0 in Accession #:    [id number]      Weight:       143.3 lb Date of Birth:  1932-05-01       BSA:          1.736 m Patient Age:    66 years        BP:           126/60 mmHg Patient Gender: M               HR:           57 bpm. Exam Location:  Inpatient Procedure: 2D Echo, Cardiac Doppler and Color  Doppler Indications:     Stroke  History:         Patient has prior history of Echocardiogram examinations, most                  recent 03/08/2020. Abnormal ECG, Arrythmias:RBBB; Risk                  Factors:Hypertension and Dyslipidemia.  Sonographer:     Roseanna Rainbow RDCS Referring Phys:  [id number] [employer] [place] Diagnosing Phys: Fransico Him MD IMPRESSIONS  1. Left ventricular ejection fraction, by estimation, is 55 to 60%. The left ventricle has normal function. The left ventricle demonstrates regional wall motion abnormalities (see scoring diagram/findings for description). Left ventricular diastolic parameters were normal. There is hypokinesis of the left ventricular, apical inferior wall.  2. Right ventricular systolic function is normal. The right ventricular size is normal. Tricuspid regurgitation signal is inadequate for assessing PA pressure.  3. The mitral valve is normal in structure. Trivial mitral valve regurgitation. No evidence of mitral stenosis.  4. The aortic valve is calcified. There is severe calcifcation of the aortic valve. There is severe thickening of the aortic valve. Aortic valve regurgitation is trivial. Mild to moderate aortic valve stenosis. Aortic valve area, by VTI measures 1.93 cm. Aortic valve mean gradient measures 16.0 mmHg. Aortic valve Vmax measures 2.49 m/s.  5. Aortic dilatation noted. There is mild dilatation of the aortic root, measuring 42 mm. There is mild dilatation of the ascending aorta, measuring 41 mm. FINDINGS  Left Ventricle: Left ventricular ejection fraction, by estimation, is 55 to 60%. The left ventricle has normal function. The left ventricle demonstrates regional wall motion abnormalities. The left ventricular internal cavity size was normal in size. There is no left ventricular hypertrophy. Left ventricular diastolic parameters were normal. Normal left ventricular filling pressure. Right Ventricle: The right ventricular size is normal. No increase in right  ventricular wall thickness. Right ventricular systolic function is normal. Tricuspid regurgitation signal is inadequate for assessing PA pressure. Left Atrium: Left atrial size was normal in size. Right Atrium: Right atrial size was normal in size. Pericardium: There is no evidence of pericardial effusion. Mitral Valve: The mitral  valve is normal in structure. Trivial mitral valve regurgitation. No evidence of mitral valve stenosis. MV peak gradient, 9.1 mmHg. The mean mitral valve gradient is 3.5 mmHg. Tricuspid Valve: The tricuspid valve is normal in structure. Tricuspid valve regurgitation is trivial. No evidence of tricuspid stenosis. Aortic Valve: The aortic valve is calcified. There is severe calcifcation of the aortic valve. There is severe thickening of the aortic valve. Aortic valve regurgitation is trivial. Mild to moderate aortic stenosis is present. Aortic valve mean gradient measures 16.0 mmHg. Aortic valve peak gradient measures 24.8 mmHg. Aortic valve area, by VTI measures 1.93 cm. Pulmonic Valve: The pulmonic valve was normal in structure. Pulmonic valve regurgitation is mild. No evidence of pulmonic stenosis. Aorta: Aortic dilatation noted. There is mild dilatation of the aortic root, measuring 42 mm. There is mild dilatation of the ascending aorta, measuring 41 mm. Venous: The inferior vena cava was not well visualized. IAS/Shunts: No atrial level shunt detected by color flow Doppler.  LEFT VENTRICLE PLAX 2D LVIDd:         4.60 cm     Diastology LVIDs:         3.30 cm     LV e' medial:    8.49 cm/s LV PW:         1.00 cm     LV E/e' medial:  13.1 LV IVS:        1.00 cm     LV e' lateral:   9.79 cm/s LVOT diam:     2.30 cm     LV E/e' lateral: 11.3 LV SV:         106 LV SV Index:   61 LVOT Area:     4.15 cm  LV Volumes (MOD) LV vol d, MOD A2C: 63.4 ml LV vol d, MOD A4C: 89.9 ml LV vol s, MOD A2C: 41.3 ml LV vol s, MOD A4C: 44.5 ml LV SV MOD A2C:     22.1 ml LV SV MOD A4C:     89.9 ml LV SV MOD BP:       35.4 ml RIGHT VENTRICLE             IVC RV S prime:     11.10 cm/s  IVC diam: 1.70 cm TAPSE (M-mode): 2.5 cm LEFT ATRIUM             Index       RIGHT ATRIUM           Index LA diam:        3.30 cm 1.90 cm/m  RA Area:     12.00 cm LA Vol (A2C):   46.5 ml 26.79 ml/m RA Volume:   27.50 ml  15.84 ml/m LA Vol (A4C):   26.9 ml 15.50 ml/m LA Biplane Vol: 34.3 ml 19.76 ml/m  AORTIC VALVE                    PULMONIC VALVE AV Area (Vmax):    2.12 cm     PR End Diast Vel: 1.42 msec AV Area (Vmean):   1.82 cm AV Area (VTI):     1.93 cm AV Vmax:           249.00 cm/s AV Vmean:          189.000 cm/s AV VTI:            0.550 m AV Peak Grad:      24.8 mmHg AV Mean Grad:  16.0 mmHg LVOT Vmax:         127.00 cm/s LVOT Vmean:        82.600 cm/s LVOT VTI:          0.255 m LVOT/AV VTI ratio: 0.46  AORTA Ao Root diam: 4.20 cm Ao Asc diam:  4.10 cm MITRAL VALVE MV Area (PHT): 4.49 cm     SHUNTS MV Area VTI:   3.41 cm     Systemic VTI:  0.26 m MV Peak grad:  9.1 mmHg     Systemic Diam: 2.30 cm MV Mean grad:  3.5 mmHg MV Vmax:       1.51 m/s MV Vmean:      84.4 cm/s MV Decel Time: 169 msec MV E velocity: 111.00 cm/s MV A velocity: 133.00 cm/s MV E/A ratio:  0.83 Fransico Him MD Electronically signed by Fransico Him MD Signature Date/Time: 12/19/2020/11:22:31 AM    Final (Updated)    VAS US CAROTID  Result Date: 12/19/2020 Carotid Arterial Duplex Study Patient Name:  SHAYA ALTAMURA  Date of Exam:   12/19/2020 Medical Rec #: [id number]        Accession #:    [id number] Date of Birth: Feb 13, 1933        Patient Gender: M Patient Age:   89 years Exam Location:  [facility] Procedure:      VAS US CAROTID Referring Phys: Lesleigh [name] --------------------------------------------------------------------------------  Indications:      CVA. Risk Factors:     Hypertension, hyperlipidemia, Diabetes, coronary artery                   disease. Comparison Study: 09/27/20 Performing Technologist: Archie Patten RVS   Examination Guidelines: A complete evaluation includes B-mode imaging, spectral Doppler, color Doppler, and power Doppler as needed of all accessible portions of each vessel. Bilateral testing is considered an integral part of a complete examination. Limited examinations for reoccurring indications may be performed as noted.  Right Carotid Findings: +---------+--------+-------+--------+---------------------------------+--------+          PSV cm/sEDV    StenosisPlaque Description               Comments                  cm/s                                                     +---------+--------+-------+--------+---------------------------------+--------+ CCA Prox 72      13             heterogenous                              +---------+--------+-------+--------+---------------------------------+--------+ CCA      63      14             heterogenous                              Distal                                                                    +---------+--------+-------+--------+---------------------------------+--------+  ICA Prox 428     122    80-99%  heterogenous, calcific and                                                irregular                                 +---------+--------+-------+--------+---------------------------------+--------+ ICA Mid  327     72                                                       +---------+--------+-------+--------+---------------------------------+--------+ ICA      152     39                                                       Distal                                                                    +---------+--------+-------+--------+---------------------------------+--------+ ECA      114                                                              +---------+--------+-------+--------+---------------------------------+--------+ +----------+--------+-------+--------+-------------------+            PSV cm/sEDV cmsDescribeArm Pressure (mmHG) +----------+--------+-------+--------+-------------------+ [id number]                                        +----------+--------+-------+--------+-------------------+ +---------+--------+--+--------+--+---------+ VertebralPSV cm/s56EDV cm/s17Antegrade +---------+--------+--+--------+--+---------+  Left Carotid Findings: +----------+--------+--------+--------+------------------+--------+           PSV cm/sEDV cm/sStenosisPlaque DescriptionComments +----------+--------+--------+--------+------------------+--------+ CCA Prox  112     17              heterogenous               +----------+--------+--------+--------+------------------+--------+ CCA Distal115     18              heterogenous               +----------+--------+--------+--------+------------------+--------+ ICA Prox  120     24      1-39%   heterogenous               +----------+--------+--------+--------+------------------+--------+ ICA Distal74      21                                         +----------+--------+--------+--------+------------------+--------+  ECA       119                                                +----------+--------+--------+--------+------------------+--------+ +----------+--------+--------+--------+-------------------+           PSV cm/sEDV cm/sDescribeArm Pressure (mmHG) +----------+--------+--------+--------+-------------------+ [id number]                                          +----------+--------+--------+--------+-------------------+ +---------+--------+--+--------+--+---------+ VertebralPSV cm/s58EDV cm/s19Antegrade +---------+--------+--+--------+--+---------+   Summary: Right Carotid: Velocities in the right ICA are consistent with a 80-99%                stenosis. Left Carotid: Velocities in the left ICA are consistent with a 1-39% stenosis. Vertebrals: Bilateral vertebral arteries  demonstrate antegrade flow. *See table(s) above for measurements and observations.     Preliminary     Pending Labs Unresulted Labs (From admission, onward)     Start     Ordered   12/20/20 0500  CBC  Tomorrow morning,   R        12/19/20 1115   12/20/20 0500  Renal function panel  Tomorrow morning,   R        12/19/20 1115   12/20/20 0500  Ferritin  (Anemia Panel (PNL))  Tomorrow morning,   R        12/19/20 1129   12/20/20 0500  Folate  (Anemia Panel (PNL))  Tomorrow morning,   R        12/19/20 1129   12/20/20 0500  Iron and TIBC  (Anemia Panel (PNL))  Tomorrow morning,   R        12/19/20 1129   12/20/20 0500  Reticulocytes  (Anemia Panel (PNL))  Tomorrow morning,   R        12/19/20 1129   12/20/20 0500  Vitamin B12  (Anemia Panel (PNL))  Tomorrow morning,   R        12/19/20 1129   12/19/20 0803  TSH  Once,   STAT        12/19/20 0802   12/19/20 0803  T4, free  Once,   STAT        12/19/20 0802   12/18/20 1934  Urine Culture  Once,   STAT       Question:  Indication  Answer:  Acute gross hematuria   12/18/20 1934            Vitals/Pain Today's Vitals   12/19/20 1100 12/19/20 1400 12/19/20 1430 12/19/20 1457  BP: 134/79 (!) 147/63 (!) 121/52   Pulse: 86 94 82   Resp: (!) 26 (!) 22 15   Temp:    98 F (36.7 C)  TempSrc:    Oral  SpO2: 98% 99% 97%   Weight:      Height:      PainSc:        Isolation Precautions No active isolations  Medications Medications  feeding supplement (PROSource TF) liquid 45 mL (45 mLs Per Tube Given 12/19/20 1059)  free water 120 mL (120 mLs Per Tube Given 12/19/20 1059)  sucralfate (CARAFATE) 1 GM/10ML suspension 1 g (1 g Oral Given 12/19/20 1057)  atorvastatin (LIPITOR) tablet 40 mg (40 mg Oral Given 12/19/20 0120)  acetaminophen (TYLENOL) tablet 650 mg (has no administration in time range)    Or  acetaminophen (TYLENOL) 160 MG/5ML solution 650 mg (has no administration in time range)    Or  acetaminophen (TYLENOL) suppository  650 mg (has no administration in time range)  brinzolamide (AZOPT) 1 % ophthalmic suspension 1 drop (1 drop Both Eyes Given 12/19/20 1057)    And  brimonidine (ALPHAGAN) 0.2 % ophthalmic solution 1 drop (1 drop Both Eyes Given 12/19/20 1057)  methimazole (TAPAZOLE) tablet 10 mg (10 mg Oral Given 12/19/20 0744)  collagenase (SANTYL) ointment 1 application (1 application Topical Not Given 12/19/20 1120)  latanoprost (XALATAN) 0.005 % ophthalmic solution 1 drop (has no administration in time range)  HYDROcodone-acetaminophen (NORCO/VICODIN) 5-325 MG per tablet 1 tablet (1 tablet Oral Given 12/19/20 0225)  polyvinyl alcohol (LIQUIFILM TEARS) 1.4 % ophthalmic solution 1 drop (1 drop Both Eyes Not Given 12/19/20 1120)  calcium-vitamin D (OSCAL WITH D) 500-200 MG-UNIT per tablet 1 tablet (1 tablet Oral Given 12/19/20 1057)  Lifitegrast 5 % SOLN 1 drop (1 drop Both Eyes Not Given 12/19/20 1121)  aspirin EC tablet 325 mg (325 mg Oral Given 12/19/20 1316)  heparin injection 5,000 Units (5,000 Units Subcutaneous Given 12/19/20 1316)  insulin aspart (novoLOG) injection 0-9 Units (0 Units Subcutaneous Not Given 12/19/20 1221)  pantoprazole (PROTONIX) injection 40 mg (40 mg Intravenous Given 12/18/20 2201)  cefTRIAXone (ROCEPHIN) 1 g in sodium chloride 0.9 % 100 mL IVPB (0 g Intravenous Stopped 12/19/20 0120)  0.9 %  sodium chloride infusion (Manually program via Guardrails IV Fluids) (0 mLs Intravenous Stopped 12/19/20 0526)   stroke: mapping our early stages of recovery book ( Does not apply Given 12/19/20 0757)  Darbepoetin Alfa (ARANESP) injection 40 mcg (40 mcg Subcutaneous Given 12/19/20 1317)    Mobility walks with device Moderate fall risk   Focused Assessments Neuro Assessment Handoff:  Swallow screen pass? Yes    NIH Stroke Scale ( + Modified Stroke Scale Criteria)  Interval: Initial Level of Consciousness (1a.)   : Alert, keenly responsive LOC Questions (1b. )   +: Answers both questions correctly LOC  Commands (1c. )   + : Performs both tasks correctly Best Gaze (2. )  +: Normal Visual (3. )  +: No visual loss Facial Palsy (4. )    : Normal symmetrical movements Motor Arm, Left (5a. )   +: No drift Motor Arm, Right (5b. )   +: No drift Motor Leg, Left (6a. )   +: No drift Motor Leg, Right (6b. )   +: No drift Limb Ataxia (7. ): Absent Sensory (8. )   +: Normal, no sensory loss Best Language (9. )   +: No aphasia Dysarthria (10. ): Normal Extinction/Inattention (11.)   +: No Abnormality Modified SS Total  +: 0 Complete NIHSS TOTAL: 0     Neuro Assessment: Within Defined Limits Neuro Checks:   Initial (12/19/20 0148)  Last Documented NIHSS Modified Score: 0 (12/19/20 1200) Has TPA been given? No If patient is a Neuro Trauma and patient is going to OR before floor call report to [place] nurse: [phone] or [phone]   R Recommendations: See Admitting Provider Note  Report given to: Samer, RN  Additional Notes:

## 2020-12-19 NOTE — ED Notes (Signed)
Pt being taken upstairs by 2 Rns on stretcher.

## 2020-12-20 DIAGNOSIS — N184 Chronic kidney disease, stage 4 (severe): Secondary | ICD-10-CM

## 2020-12-20 DIAGNOSIS — D62 Acute posthemorrhagic anemia: Secondary | ICD-10-CM | POA: Diagnosis not present

## 2020-12-20 DIAGNOSIS — I639 Cerebral infarction, unspecified: Secondary | ICD-10-CM | POA: Diagnosis not present

## 2020-12-20 DIAGNOSIS — Z01810 Encounter for preprocedural cardiovascular examination: Secondary | ICD-10-CM

## 2020-12-20 DIAGNOSIS — I6521 Occlusion and stenosis of right carotid artery: Secondary | ICD-10-CM

## 2020-12-20 DIAGNOSIS — Z9079 Acquired absence of other genital organ(s): Secondary | ICD-10-CM

## 2020-12-20 DIAGNOSIS — E119 Type 2 diabetes mellitus without complications: Secondary | ICD-10-CM

## 2020-12-20 DIAGNOSIS — D649 Anemia, unspecified: Secondary | ICD-10-CM | POA: Diagnosis not present

## 2020-12-20 LAB — CBC
HCT: 22.6 % — ABNORMAL LOW (ref 39.0–52.0)
Hemoglobin: 7.5 g/dL — ABNORMAL LOW (ref 13.0–17.0)
MCH: 28.7 pg (ref 26.0–34.0)
MCHC: 33.2 g/dL (ref 30.0–36.0)
MCV: 86.6 fL (ref 80.0–100.0)
Platelets: 268 10*3/uL (ref 150–400)
RBC: 2.61 MIL/uL — ABNORMAL LOW (ref 4.22–5.81)
RDW: 13.9 % (ref 11.5–15.5)
WBC: 5 10*3/uL (ref 4.0–10.5)
nRBC: 0 % (ref 0.0–0.2)

## 2020-12-20 LAB — FERRITIN: Ferritin: 120 ng/mL (ref 24–336)

## 2020-12-20 LAB — BPAM RBC
Blood Product Expiration Date: 202210112359
ISSUE DATE / TIME: 202209200319
Unit Type and Rh: 6200

## 2020-12-20 LAB — RENAL FUNCTION PANEL
Albumin: 2.2 g/dL — ABNORMAL LOW (ref 3.5–5.0)
Anion gap: 9 (ref 5–15)
BUN: 38 mg/dL — ABNORMAL HIGH (ref 8–23)
CO2: 20 mmol/L — ABNORMAL LOW (ref 22–32)
Calcium: 8.1 mg/dL — ABNORMAL LOW (ref 8.9–10.3)
Chloride: 109 mmol/L (ref 98–111)
Creatinine, Ser: 1.92 mg/dL — ABNORMAL HIGH (ref 0.61–1.24)
GFR, Estimated: 34 mL/min — ABNORMAL LOW (ref >60)
Glucose, Bld: 115 mg/dL — ABNORMAL HIGH (ref 70–99)
Phosphorus: 4.3 mg/dL (ref 2.5–4.6)
Potassium: 4.2 mmol/L (ref 3.5–5.1)
Sodium: 138 mmol/L (ref 135–145)

## 2020-12-20 LAB — VITAMIN B12: Vitamin B-12: 260 pg/mL (ref 180–914)

## 2020-12-20 LAB — TYPE AND SCREEN
ABO/RH(D): A POS
Antibody Screen: NEGATIVE
Unit division: 0

## 2020-12-20 LAB — RETICULOCYTES
Immature Retic Fract: 24.1 % — ABNORMAL HIGH (ref 2.3–15.9)
RBC.: 2.59 MIL/uL — ABNORMAL LOW (ref 4.22–5.81)
Retic Count, Absolute: 52.8 10*3/uL (ref 19.0–186.0)
Retic Ct Pct: 2 % (ref 0.4–3.1)

## 2020-12-20 LAB — GLUCOSE, CAPILLARY
Glucose-Capillary: 112 mg/dL — ABNORMAL HIGH (ref 70–99)
Glucose-Capillary: 147 mg/dL — ABNORMAL HIGH (ref 70–99)
Glucose-Capillary: 198 mg/dL — ABNORMAL HIGH (ref 70–99)
Glucose-Capillary: 146 mg/dL — ABNORMAL HIGH (ref 70–99)

## 2020-12-20 LAB — FOLATE: Folate: 10.2 ng/mL (ref >5.9)

## 2020-12-20 LAB — IRON AND TIBC
Iron: 17 ug/dL — ABNORMAL LOW (ref 45–182)
Saturation Ratios: 10 % — ABNORMAL LOW (ref 17.9–39.5)
TIBC: 167 ug/dL — ABNORMAL LOW (ref 250–450)
UIBC: 150 ug/dL

## 2020-12-20 LAB — URINE CULTURE: Culture: NO GROWTH

## 2020-12-20 MED ORDER — CYANOCOBALAMIN 1000 MCG/ML IJ SOLN
1000.0000 ug | Freq: Every day | INTRAMUSCULAR | Status: DC
  Administered 2020-12-20 – 2020-12-26 (×5): 1000 ug via SUBCUTANEOUS
  Filled 2020-12-20 (×6): qty 1

## 2020-12-20 NOTE — Evaluation (Signed)
Occupational Therapy Evaluation Patient Details Name: John Walton MRN: [id number] DOB: 12-13-1932 Today's Date: 12/20/2020   History of Present Illness Pt is an 87 y/o male admitted 9/19 secondary to dysarthria, visual deficits, and L hand numbness. 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 Pt also found to have R carotid stenosis and per notes likely for Right carotid endarterectomy during admission. PMH includes glaucoma, CKD, DM, HTN, PVD.   Clinical Impression   Odysseus required assist with bathing tasks, and ambulated with RW prior to the above admission. He lives home with his wife with a ramped entrance and elevator/chair access to the upstairs bed/bath. Pt was very tangential throughout the session, perseverating on pharmaceutical career, and was difficult to re-direct to tasks and determine baseline functioning vs. Impairment cognition. Pt has bilateral lower field deficit, but reported impaired vision at baseline due to glaucoma. He complete a clock draw task with 0 errors and could read large print in the room at various distances. Pt required min A for functional ambulation with RW, and up to max A for lower body ADLs. Pt is also limited by poor R shoulder ROM due to a fall in December. Pt will benefit from OT acutely to address the deficits and limitations below. Recommend CIR at d/c for intensive rehab to progress towards his baseline prior to d/c home.   Spoke with wife who stated pt currently has OT 1x/wk, PT 2x/wk and RA 1x/wk for wound care and vital management. Pts wife is home with husband however cannot provide physical assist.       Recommendations for follow up therapy are one component of a multi-disciplinary discharge planning process, led by the attending physician.  Recommendations may be updated based on patient status, additional functional criteria and insurance authorization.   Follow Up Recommendations    CIR   Equipment  Recommendations  3 in 1 bedside commode       Precautions / Restrictions Precautions Precautions: Fall Precaution Comments: peg Restrictions Weight Bearing Restrictions: No      Mobility Bed Mobility Overal bed mobility: Needs Assistance Bed Mobility: Supine to Sit     Supine to sit: Min guard     General bed mobility comments: min guard for safety only + increased time and effort, HOB elevated    Transfers Overall transfer level: Needs assistance Equipment used: Rolling walker (2 wheeled) Transfers: Sit to/from Stand Sit to Stand: Min assist         General transfer comment: Pt requiring min A for lift assist    Balance Overall balance assessment: Needs assistance Sitting-balance support: Feet supported Sitting balance-Leahy Scale: Fair     Standing balance support: Bilateral upper extremity supported;During functional activity Standing balance-Leahy Scale: Poor Standing balance comment: Reliant on BUE support                           ADL either performed or assessed with clinical judgement   ADL Overall ADL's : Needs assistance/impaired Eating/Feeding: Sitting;Set up Eating/Feeding Details (indicate cue type and reason): pt needed set up A for visual deficits Grooming: Min guard;Standing Grooming Details (indicate cue type and reason): otherwise set up sitting Upper Body Bathing: Set up;Sitting   Lower Body Bathing: Maximal assistance;Sit to/from stand   Upper Body Dressing : Sitting;Minimal assistance Upper Body Dressing Details (indicate cue type and reason): pt requried min A verbal cues to complete the task due to pt being tangentail and difficult  to re-direct to the task Lower Body Dressing: Maximal assistance;Sit to/from stand   Toilet Transfer: Minimal assistance;RW;Ambulation   Toileting- Water quality scientist and Hygiene: Min guard;Sitting/lateral lean       Functional mobility during ADLs: Minimal assistance;Rolling  walker;Cueing for safety General ADL Comments: MaxA for lower body tasks due to limited ROM, strength, activity tolerance and balance in standing. MinA for mobility for powering into standing     Vision Baseline Vision/History: 3 Glaucoma Ability to See in Adequate Light: 1 Impaired Patient Visual Report: Other (comment) (Difficult to assess due to pt's cognition and difficulty to attend to a task or directly answer question. pt stated he has glaucoma, is receiving treatment and has planned surgery - apparent bilat lower quadrant field deficit; unsure if this is baseline) Vision Assessment?: Vision impaired- to be further tested in functional context Additional Comments: pt has deficits in his bilateral lower fields; unsure of baseline impairment from glaucoma     Perception Perception Perception Tested?: No   Praxis      Pertinent Vitals/Pain Pain Assessment: Faces Faces Pain Scale: No hurt Pain Intervention(s): Monitored during session     Hand Dominance Right   Extremity/Trunk Assessment Upper Extremity Assessment Upper Extremity Assessment: RUE deficits/detail;LUE deficits/detail RUE Deficits / Details: R shoulder limited to about 90* AROM and 120* PROM; overall 4/5 strength; pt able to use R hand functionally for writing task RUE Sensation: WNL RUE Coordination: decreased fine motor LUE Deficits / Details: Left shoulder ROM limited to ~30* active ROM & 90* PROM, pt reports this is from a fall in December. Grip strength overall WFL LUE Sensation: WNL LUE Coordination: decreased fine motor   Lower Extremity Assessment Lower Extremity Assessment: Generalized weakness   Cervical / Trunk Assessment Cervical / Trunk Assessment: Kyphotic   Communication Communication Communication: No difficulties (tangential)   Cognition Arousal/Alertness: Awake/alert Behavior During Therapy: WFL for tasks assessed/performed Overall Cognitive Status: No family/caregiver present to  determine baseline cognitive functioning                                 General Comments: tangential throughout session and difficult to re-direct to task. Pt referring to pharmaceutical buisness that he is seemingly still very active in throughout session   General Comments  VSS on RA; pt demonstraetd good safety awareness    Exercises     Shoulder Instructions      Home Living Family/patient expects to be discharged to:: Private residence Living Arrangements: Spouse/significant other Available Help at Discharge: Family Type of Home: House Home Access: Ramped entrance     [place]: Two level Alternate Level Stairs-Number of Steps: flight with chair lift/elevator   Bathroom Shower/Tub: Occupational psychologist: Standard     Home Equipment: Environmental consultant - 2 wheels      Lives With: Spouse    Prior Functioning/Environment Level of Independence: Independent with assistive device(s)        Comments: Was using RW for mobility; had a fall in December 2021        OT Problem List: Decreased strength;Decreased range of motion;Impaired balance (sitting and/or standing);Decreased activity tolerance;Decreased safety awareness;Decreased knowledge of use of DME or AE;Decreased knowledge of precautions;Pain      OT Treatment/Interventions: Self-care/ADL training;Therapeutic exercise;Balance training;DME and/or AE instruction;Therapeutic activities;Patient/family education    OT Goals(Current goals can be found in the care plan section) Acute Rehab OT Goals Patient Stated Goal: to go home  OT Goal Formulation: With patient Time For Goal Achievement: 01/03/21 Potential to Achieve Goals: Fair ADL Goals Pt Will Perform Grooming: with supervision;standing Pt Will Perform Lower Body Bathing: with set-up;sit to/from stand Pt Will Perform Lower Body Dressing: with set-up;sit to/from stand Pt Will Transfer to Toilet: with supervision;ambulating Pt Will Perform  Toileting - Clothing Manipulation and hygiene: Independently  OT Frequency: Min 2X/week   Barriers to D/C:            Co-evaluation              AM-PAC OT "6 Clicks" Daily Activity     Outcome Measure Help from another person eating meals?: A Little Help from another person taking care of personal grooming?: A Little Help from another person toileting, which includes using toliet, bedpan, or urinal?: A Lot Help from another person bathing (including washing, rinsing, drying)?: A Lot Help from another person to put on and taking off regular upper body clothing?: A Little Help from another person to put on and taking off regular lower body clothing?: A Lot 6 Click Score: 15   End of Session Equipment Utilized During Treatment: Gait belt;Rolling walker Nurse Communication: Mobility status;Precautions  Activity Tolerance: Patient tolerated treatment well Patient left: in chair;with call bell/phone within reach;with chair alarm set  OT Visit Diagnosis: Unsteadiness on feet (R26.81);Muscle weakness (generalized) (M62.81);Pain                Time: 3474-2595 OT Time Calculation (min): 35 min Charges:  OT General Charges $OT Visit: 1 Visit OT Evaluation $OT Eval Moderate Complexity: 1 Mod OT Treatments $Therapeutic Activity: 8-22 mins   Nikeisha Klutz A Ellias Mcelreath 12/20/2020, 12:51 PM

## 2020-12-20 NOTE — Plan of Care (Signed)
Pt is alert oriented x 4. Pt NIH -0. No distress noted.   Problem: Education: Goal: Knowledge of General Education information will improve Description: Including pain rating scale, medication(s)/side effects and non-pharmacologic comfort measures Outcome: Progressing   Problem: Health Behavior/Discharge Planning: Goal: Ability to manage health-related needs will improve Outcome: Progressing   Problem: Clinical Measurements: Goal: Ability to maintain clinical measurements within normal limits will improve Outcome: Progressing Goal: Will remain free from infection Outcome: Progressing Goal: Diagnostic test results will improve Outcome: Progressing Goal: Respiratory complications will improve Outcome: Progressing Goal: Cardiovascular complication will be avoided Outcome: Progressing   Problem: Activity: Goal: Risk for activity intolerance will decrease Outcome: Progressing   Problem: Nutrition: Goal: Adequate nutrition will be maintained Outcome: Progressing   Problem: Coping: Goal: Level of anxiety will decrease Outcome: Progressing   Problem: Elimination: Goal: Will not experience complications related to bowel motility Outcome: Progressing   Problem: Elimination: Goal: Will not experience complications related to bowel motility Outcome: Progressing Goal: Will not experience complications related to urinary retention Outcome: Progressing   Problem: Safety: Goal: Ability to remain free from injury will improve Outcome: Progressing   Problem: Skin Integrity: Goal: Risk for impaired skin integrity will decrease Outcome: Progressing

## 2020-12-20 NOTE — Progress Notes (Signed)
Inpatient Rehab Admissions Coordinator Note:   Per PT recommendations patient was screened for CIR candidacy by Michel Santee, PT. At this time, pt appears to be a potential candidate for CIR. I will place an order for rehab consult for full assessment, per our protocol.  Please contact me any with questions.Shann Medal, PT, DPT [phone] 12/20/20 9:29 AM

## 2020-12-20 NOTE — Plan of Care (Signed)

## 2020-12-20 NOTE — Progress Notes (Signed)
PROGRESS NOTE        PATIENT DETAILS Name: John Walton Age: 87 y.o. Sex: male Date of Birth: April 26, 1932 Admit Date: 12/18/2020 Admitting Physician Etta Quill, DO EVO:[id number], Fransico Him, MD  Brief Narrative: Patient is a 87 y.o. male with history of known right carotid artery stenosis, HTN, HLD, DM-2, CKD stage IV-recent TURP on 9/15-presenting with left eye blurry vision, left hand numbness/weakness and dysarthria.  She was found to have acute CVA and subsequently admitted to the hospitalist service.  See below for further details.    Subjective: Lying comfortably in bed-no major issues overnight.  Objective: Vitals: Blood pressure (!) 147/62, pulse 80, temperature 98 F (36.7 C), temperature source Oral, resp. rate 17, height 5\' 6"  (1.676 m), weight 65 kg, SpO2 98 %.   Exam: Gen Exam:Alert awake-not in any distress.  Hard of hearing. HEENT:atraumatic, normocephalic Chest: B/L clear to auscultation anteriorly CVS:S1S2 regular Abdomen:soft non tender, non distended Extremities:no edema Neurology: Appears to be nonfocal today. Skin: no rash   Pertinent Labs/Radiology: WBC:6.9 Hb:7.6 Na:138 K:4.1 Creatinine:2.29  Microbiology: 9/19>>Urine Culture: No growth  Stroke work up: 9/19>> CT head: No acute intracranial abnormality. 9/19>> MRI brain: Patchy multifocal acute infarcts involving right frontal/parietal and occipital lobes. 9/20>> A1c: 6.5 9/20>> LDL: 58 9/20>> carotid Doppler: 80-99% stenosis on right, 1-39% on left.   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  On aspirin/statin-neurology has signed off-plans are for carotid endarterectomy on 9/23.  He no longer has blurry vision-speech is clear-and do not think he has any significant left-sided weakness today.     Symptomatic right carotid artery stenosis: Plans are for carotid endarterectomy-on aspirin and statin.  Echo showing positive regional wall motion abnormality-we will get preoperative assessment by cardiology.  Acute blood loss anemia superimposed on anemia due to chronic disease/renal failure: Recent TURP with significant amount of hematuria that seems to be slowly getting better.  Hemoglobin relatively stable after 1 unit of PRBC-continue Aranesp weekly-follow CBC and transfuse accordingly.   BPH-s/p TURP: Did have hematuria postoperatively-that per patient/spouse has improved markedly over the past few days-only a pink tinge to his urine.  Spoke with patient's primary urologist-Dr. [name] on 9/20-okay to place patient on antiplatelets-okay to proceed with endarterectomy-and okay for intraoperative heparin.  If significant hematuria reoccurs-recommends we place a Foley catheter.  ?  UTI versus asymptomatic bacteriuria: Recent TURP-UA with significant amount of RBCs/WBCs-no symptoms of UTI-given 1 dose of IV Rocephin-cultures are negative-continue to monitor of antimicrobial therapy.  AKI on CKD stage IV: AKI likely hemodynamically mediated in the setting of hematuria/recent surgery-creatinine improving and close to baseline.  Avoid nephrotoxic agents-follow closely.    DM-2: Diet controlled at home-continue SSI-CBG stable.  Recent Labs    12/19/20 2056 12/20/20 0531 12/20/20 1158  GLUCAP 178* 112* 147*      HTN: BP stable-allow permissive hypertension.  HLD: On statin  Hyperthyroidism: On methimazole-TSH stable.  History of transient dysphagia due to severe esophageal spasm in the setting of allergy to Robitussin-PEG tube remains in place: Although patient able to consume orally without any issues.  Per spouse-PEG tube is used for occasional nutritional supplements  Procedures: None Consults: Neurology, vascular surgery, cardiology DVT Prophylaxis: SQ Heparin Code Status:Full  code Family Communication: Spouse  at bedside on 9/20-no one at bedside today.  Time spent: 35 minutes-Greater than 50% of this time was spent in counseling, explanation of diagnosis, planning of further management, and coordination of care.  Diet: Diet Order             Diet Heart Room service appropriate? Yes; Fluid consistency: Thin  Diet effective now                     Disposition Plan: Status is: Inpatient  Remains inpatient appropriate because:Inpatient level of care appropriate due to severity of illness  Dispo: The patient is from: Home              Anticipated d/c is to: Home              Patient currently is not medically stable to d/c.   Difficult to place patient No  Barriers to Discharge: Acute CVA due to right carotid artery stenosis-severe anemia due to blood loss-needs inpatient optimization/neuro/vascular surgery evaluation before consideration of discharge.  Antimicrobial agents: Anti-infectives (From admission, onward)    Start     Dose/Rate Route Frequency Ordered Stop   12/18/20 2345  cefTRIAXone (ROCEPHIN) 1 g in sodium chloride 0.9 % 100 mL IVPB        1 g 200 mL/hr over 30 Minutes Intravenous  Once 12/18/20 2343 12/19/20 0120        MEDICATIONS: Scheduled Meds:  aspirin EC  325 mg Oral Daily   atorvastatin  40 mg Oral QHS   brinzolamide  1 drop Both Eyes BID   And   brimonidine  1 drop Both Eyes BID   calcium-vitamin D  1 tablet Oral TID with meals   collagenase  1 application Topical QODAY   cyanocobalamin  1,000 mcg Subcutaneous Daily   feeding supplement (PROSource TF)  45 mL Per Tube BID   free water  120 mL Per Tube BID   heparin injection (subcutaneous)  5,000 Units Subcutaneous Q8H   insulin aspart  0-9 Units Subcutaneous TID WC   latanoprost  1 drop Both Eyes QHS   Lifitegrast  1 drop Both Eyes BID   methimazole  10 mg Oral Q breakfast   polyvinyl alcohol  1 drop Both Eyes TID   sucralfate  1 g Oral TID AC   Continuous  Infusions: PRN Meds:.acetaminophen **OR** acetaminophen (TYLENOL) oral liquid 160 mg/5 mL **OR** acetaminophen, HYDROcodone-acetaminophen   I have personally reviewed following labs and imaging studies  LABORATORY DATA: CBC: Recent Labs  Lab 12/18/20 1944 12/18/20 1952 12/19/20 0742 12/20/20 0332  WBC 11.4*  --  6.9 5.0  NEUTROABS 9.1*  --   --   --   HGB 7.2* 7.1* 7.6* 7.5*  HCT 23.4* 21.0* 24.2* 22.6*  MCV 90.3  --  90.6 86.6  PLT 380  --  277 268     Basic Metabolic Panel: Recent Labs  Lab 12/14/20 1100 12/18/20 1944 12/18/20 1952 12/19/20 0742 12/20/20 0332  NA 134* 136 138 138 138  K 4.2 4.9 4.6 4.1 4.2  CL 108 108 109 111 109  CO2 21* 21*  --  19* 20*  GLUCOSE 112* 160* 159* 89 115*  BUN 39* 43* 42* 42* 38*  CREATININE 2.39* 2.45* 2.50* 2.29* 1.92*  CALCIUM 8.6* 8.4*  --  8.1* 8.1*  PHOS  --   --   --   --  4.3     GFR: Estimated Creatinine Clearance: 24.9 mL/min (A) (by  C-G formula based on SCr of 1.92 mg/dL (H)).  Liver Function Tests: Recent Labs  Lab 12/18/20 1944 12/20/20 0332  AST 13*  --   ALT 8  --   ALKPHOS 83  --   BILITOT 0.1*  --   PROT 6.9  --   ALBUMIN 3.0* 2.2*    No results for input(s): LIPASE, AMYLASE in the last 168 hours. No results for input(s): AMMONIA in the last 168 hours.  Coagulation Profile: Recent Labs  Lab 12/18/20 1944  INR 1.2     Cardiac Enzymes: No results for input(s): CKTOTAL, CKMB, CKMBINDEX, TROPONINI in the last 168 hours.  BNP (last 3 results) No results for input(s): PROBNP in the last 8760 hours.  Lipid Profile: Recent Labs    12/19/20 0742  CHOL 114  HDL 36*  LDLCALC 58  TRIG 99  CHOLHDL 3.2     Thyroid Function Tests: Recent Labs    12/19/20 0800 12/19/20 0803  TSH  --  1.259  FREET4 1.06  --     Anemia Panel: Recent Labs    12/20/20 0332  VITAMINB12 260  FOLATE 10.2  FERRITIN 120  TIBC 167*  IRON 17*  RETICCTPCT 2.0    Urine analysis:    Component Value  Date/Time   COLORURINE YELLOW 12/18/2020 1934   APPEARANCEUR TURBID (A) 12/18/2020 1934   LABSPEC 1.025 12/18/2020 1934   PHURINE 5.5 12/18/2020 1934   GLUCOSEU NEGATIVE 12/18/2020 1934   GLUCOSEU 500 (A) 07/11/2014 0850   HGBUR LARGE (A) 12/18/2020 1934   BILIRUBINUR NEGATIVE 12/18/2020 1934   KETONESUR NEGATIVE 12/18/2020 1934   PROTEINUR >300 (A) 12/18/2020 1934   UROBILINOGEN 0.2 07/11/2014 0850   NITRITE NEGATIVE 12/18/2020 1934   LEUKOCYTESUR SMALL (A) 12/18/2020 1934    Sepsis Labs: Lactic Acid, Venous    Component Value Date/Time   LATICACIDVEN 1.9 07/16/2020 1601    MICROBIOLOGY: Recent Results (from the past 240 hour(s))  SARS Coronavirus 2 (TAT 6-24 hrs)     Status: None   Collection Time: 12/12/20 12:00 AM  Result Value Ref Range Status   SARS Coronavirus 2 RESULT: NEGATIVE  Final    Comment: RESULT: NEGATIVESARS-CoV-2 INTERPRETATION:A NEGATIVE  test result means that SARS-CoV-2 RNA was not present in the specimen above the limit of detection of this test. This does not preclude a possible SARS-CoV-2 infection and should not be used as the  sole basis for patient management decisions. Negative results must be combined with clinical observations, patient history, and epidemiological information. Optimum specimen types and timing for peak viral levels during infections caused by SARS-CoV-2  have not been determined. Collection of multiple specimens or types of specimens may be necessary to detect virus. Improper specimen collection and handling, sequence variability under primers/probes, or organism present below the limit of detection may  lead to false negative results. Positive and negative predictive values of testing are highly dependent on prevalence. False negative test results are more likely when prevalence of disease is high.The expected result is NEGATIVE.Fact S heet for  Healthcare Providers: LocalChronicle.no Sheet for Patients:  SalonLookup.es Reference Range - Negative   Surgical PCR screen     Status: None   Collection Time: 12/14/20 10:53 PM   Specimen: Nasal Mucosa; Nasal Swab  Result Value Ref Range Status   MRSA, PCR NEGATIVE NEGATIVE Final   Staphylococcus aureus NEGATIVE NEGATIVE Final    Comment: (NOTE) The Xpert SA Assay (FDA approved for NASAL specimens in patients 25 years of age  and older), is one component of a comprehensive surveillance program. It is not intended to diagnose infection nor to guide or monitor treatment. Performed at [facility], [place] [address], [place], [place] [address]   Urine Culture     Status: None   Collection Time: 12/18/20  7:34 PM   Specimen: Urine, Clean Catch  Result Value Ref Range Status   Specimen Description URINE, CLEAN CATCH  Final   Special Requests NONE  Final   Culture   Final    NO GROWTH Performed at [facility], [place] [address] [place], [place] [address]    Report Status 12/20/2020 FINAL  Final  Resp Panel by RT-PCR (Flu A&B, Covid) Nasopharyngeal Swab     Status: None   Collection Time: 12/18/20  7:45 PM   Specimen: Nasopharyngeal Swab; Nasopharyngeal(NP) swabs in vial transport medium  Result Value Ref Range Status   SARS Coronavirus 2 by RT PCR NEGATIVE NEGATIVE Final    Comment: (NOTE) SARS-CoV-2 target nucleic acids are NOT DETECTED.  The SARS-CoV-2 RNA is generally detectable in upper respiratory specimens during the acute phase of infection. The lowest concentration of SARS-CoV-2 viral copies this assay can detect is 138 copies/mL. A negative result does not preclude SARS-Cov-2 infection and should not be used as the sole basis for treatment or other patient management decisions. A negative result may occur with  improper specimen collection/handling, submission of specimen other than nasopharyngeal swab, presence of viral mutation(s) within the areas targeted by this assay, and  inadequate number of viral copies(<138 copies/mL). A negative result must be combined with clinical observations, patient history, and epidemiological information. The expected result is Negative.  Fact Sheet for Patients:  EntrepreneurPulse.com.au  Fact Sheet for Healthcare Providers:  IncredibleEmployment.be  This test is no t yet approved or cleared by the [place] FDA and  has been authorized for detection and/or diagnosis of SARS-CoV-2 by FDA under an Emergency Use Authorization (EUA). This EUA will remain  in effect (meaning this test can be used) for the duration of the COVID-19 declaration under Section 564(b)(1) of the Act, 21 U.S.C.section 360bbb-3(b)(1), unless the authorization is terminated  or revoked sooner.       Influenza A by PCR NEGATIVE NEGATIVE Final   Influenza B by PCR NEGATIVE NEGATIVE Final    Comment: (NOTE) The Xpert Xpress SARS-CoV-2/FLU/RSV plus assay is intended as an aid in the diagnosis of influenza from Nasopharyngeal swab specimens and should not be used as a sole basis for treatment. Nasal washings and aspirates are unacceptable for Xpert Xpress SARS-CoV-2/FLU/RSV testing.  Fact Sheet for Patients: EntrepreneurPulse.com.au  Fact Sheet for Healthcare Providers: IncredibleEmployment.be  This test is not yet approved or cleared by the [place] FDA and has been authorized for detection and/or diagnosis of SARS-CoV-2 by FDA under an Emergency Use Authorization (EUA). This EUA will remain in effect (meaning this test can be used) for the duration of the COVID-19 declaration under Section 564(b)(1) of the Act, 21 U.S.C. section 360bbb-3(b)(1), unless the authorization is terminated or revoked.  Performed at [facility], [place] [address], [place], [place] [address]     RADIOLOGY STUDIES/RESULTS: CT HEAD WO CONTRAST  Result Date: 12/18/2020 CLINICAL DATA:   TIA. EXAM: CT HEAD WITHOUT CONTRAST TECHNIQUE: Contiguous axial images were obtained from the base of the skull through the vertex without intravenous contrast. COMPARISON:  CT head 03/07/2020. FINDINGS: Brain: No evidence of acute infarction, hemorrhage, hydrocephalus, extra-axial collection or mass  lesion/mass effect. There is stable mild diffuse atrophy. There is also stable mild periventricular white matter hypodensity, likely chronic small vessel ischemic change. 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 Vascular: Atherosclerotic calcifications are present within the cavernous internal carotid arteries. Skull: Normal. Negative for fracture or focal lesion. Sinuses/Orbits: No acute finding. Other: None. IMPRESSION: No acute intracranial abnormality. Electronically Signed   By: Ronney Asters M.D.   On: 12/18/2020 21:12   MR ANGIO HEAD WO CONTRAST  Result Date: 12/19/2020 CLINICAL DATA:  Stroke, follow-up EXAM: MRA HEAD WITHOUT CONTRAST TECHNIQUE: Angiographic images of the Circle of Willis were acquired using MRA technique without intravenous contrast. COMPARISON:  No pertinent prior exam. FINDINGS: Intracranial internal carotid arteries are patent. Middle and anterior cerebral arteries are patent. Intracranial vertebral arteries, basilar artery, posterior cerebral arteries are patent. Bilateral posterior communicating arteries are present. There is no significant stenosis or aneurysm. IMPRESSION: Normal MRA head. Electronically Signed   By: Macy Mis M.D.   On: 12/19/2020 12:32   MR BRAIN WO CONTRAST  Result Date: 12/19/2020 CLINICAL DATA:  Initial evaluation for neuro deficit, stroke suspected. EXAM: MRI HEAD WITHOUT CONTRAST TECHNIQUE: Multiplanar, multiecho pulse sequences of the brain and surrounding structures were obtained without intravenous contrast. COMPARISON:  CT from earlier the same day. FINDINGS: Brain: Generalized age-related cerebral atrophy.  Patchy and confluent T2/FLAIR hyperintensity involving the periventricular deep white matter both cerebral hemispheres as well as the pons, most consistent with chronic small vessel ischemic disease moderate in nature. Encephalomalacia and gliosis involving the anterior right frontal region consistent with a chronic right MCA distribution infarct. Patchy multifocal areas of restricted diffusion seen involving the cortical gray matter of the right frontal, parietal, and occipital lobes, consistent with acute ischemic infarcts. These measure up to approximately 1 cm in size, and are somewhat watershed in distribution. No associated hemorrhage or mass effect. No other evidence for acute or subacute ischemia. Gray-white matter differentiation otherwise maintained. Few scattered additional chronic micro hemorrhages noted involving the right greater than left cerebral hemispheres, likely small vessel/hypertensive in nature. No mass lesion, midline shift or mass effect. No hydrocephalus or extra-axial fluid collection. Pituitary gland suprasellar region within normal limits. Midline structures intact. Vascular: Major intracranial vascular flow voids are grossly maintained. Skull and upper cervical spine: Craniocervical junction within normal limits. Bone marrow signal intensity normal. No scalp soft tissue abnormality. Sinuses/Orbits: Prior bilateral ocular lens replacement. Mild scattered mucosal thickening noted within the ethmoidal air cells. Paranasal sinuses are otherwise clear. No significant mastoid effusion. Inner ear structures grossly normal. Other: None. IMPRESSION: 1. Patchy multifocal acute ischemic infarcts involving the cortical gray matter of the right frontal, parietal, and occipital lobes as above. No associated hemorrhage or mass effect. 2. 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 3. Underlying moderate chronic microvascular ischemic disease. Electronically Signed   By:  Jeannine Boga M.D.   On: 12/19/2020 00:18   ECHOCARDIOGRAM COMPLETE  Result Date: 12/19/2020    ECHOCARDIOGRAM REPORT   Patient Name:   John Walton Date of Exam: 12/19/2020 Medical Rec #:  [id number]       Height:       66.0 in Accession #:    [id number]      Weight:       143.3 lb Date of Birth:  Nov 30, 1932       BSA:          1.736 m Patient Age:  88 years        BP:           126/60 mmHg Patient Gender: M               HR:           57 bpm. Exam Location:  Inpatient Procedure: 2D Echo, Cardiac Doppler and Color Doppler Indications:     Stroke  History:         Patient has prior history of Echocardiogram examinations, most                  recent 03/08/2020. Abnormal ECG, Arrythmias:RBBB; Risk                  Factors:Hypertension and Dyslipidemia.  Sonographer:     Roseanna Rainbow RDCS Referring Phys:  [id number] [employer] [place] Diagnosing Phys: Fransico Him MD IMPRESSIONS  1. Left ventricular ejection fraction, by estimation, is 55 to 60%. The left ventricle has normal function. The left ventricle demonstrates regional wall motion abnormalities (see scoring diagram/findings for description). Left ventricular diastolic parameters were normal. There is hypokinesis of the left ventricular, apical inferior wall.  2. Right ventricular systolic function is normal. The right ventricular size is normal. Tricuspid regurgitation signal is inadequate for assessing PA pressure.  3. The mitral valve is normal in structure. Trivial mitral valve regurgitation. No evidence of mitral stenosis.  4. The aortic valve is calcified. There is severe calcifcation of the aortic valve. There is severe thickening of the aortic valve. Aortic valve regurgitation is trivial. Mild to moderate aortic valve stenosis. Aortic valve area, by VTI measures 1.93 cm. Aortic valve mean gradient measures 16.0 mmHg. Aortic valve Vmax measures 2.49 m/s.  5. Aortic dilatation noted. There is mild dilatation of the aortic root, measuring 42 mm. There is  mild dilatation of the ascending aorta, measuring 41 mm. FINDINGS  Left Ventricle: Left ventricular ejection fraction, by estimation, is 55 to 60%. The left ventricle has normal function. The left ventricle demonstrates regional wall motion abnormalities. The left ventricular internal cavity size was normal in size. There is no left ventricular hypertrophy. Left ventricular diastolic parameters were normal. Normal left ventricular filling pressure. Right Ventricle: The right ventricular size is normal. No increase in right ventricular wall thickness. Right ventricular systolic function is normal. Tricuspid regurgitation signal is inadequate for assessing PA pressure. Left Atrium: Left atrial size was normal in size. Right Atrium: Right atrial size was normal in size. Pericardium: There is no evidence of pericardial effusion. Mitral Valve: The mitral valve is normal in structure. Trivial mitral valve regurgitation. No evidence of mitral valve stenosis. MV peak gradient, 9.1 mmHg. The mean mitral valve gradient is 3.5 mmHg. Tricuspid Valve: The tricuspid valve is normal in structure. Tricuspid valve regurgitation is trivial. No evidence of tricuspid stenosis. Aortic Valve: The aortic valve is calcified. There is severe calcifcation of the aortic valve. There is severe thickening of the aortic valve. Aortic valve regurgitation is trivial. Mild to moderate aortic stenosis is present. Aortic valve mean gradient measures 16.0 mmHg. Aortic valve peak gradient measures 24.8 mmHg. Aortic valve area, by VTI measures 1.93 cm. Pulmonic Valve: The pulmonic valve was normal in structure. Pulmonic valve regurgitation is mild. No evidence of pulmonic stenosis. Aorta: Aortic dilatation noted. There is mild dilatation of the aortic root, measuring 42 mm. There is mild dilatation of the ascending aorta, measuring 41 mm. Venous: The inferior vena cava was not well visualized. IAS/Shunts: No atrial  level shunt detected by color flow  Doppler.  LEFT VENTRICLE PLAX 2D LVIDd:         4.60 cm     Diastology LVIDs:         3.30 cm     LV e' medial:    8.49 cm/s LV PW:         1.00 cm     LV E/e' medial:  13.1 LV IVS:        1.00 cm     LV e' lateral:   9.79 cm/s LVOT diam:     2.30 cm     LV E/e' lateral: 11.3 LV SV:         106 LV SV Index:   61 LVOT Area:     4.15 cm  LV Volumes (MOD) LV vol d, MOD A2C: 63.4 ml LV vol d, MOD A4C: 89.9 ml LV vol s, MOD A2C: 41.3 ml LV vol s, MOD A4C: 44.5 ml LV SV MOD A2C:     22.1 ml LV SV MOD A4C:     89.9 ml LV SV MOD BP:      35.4 ml RIGHT VENTRICLE             IVC RV S prime:     11.10 cm/s  IVC diam: 1.70 cm TAPSE (M-mode): 2.5 cm LEFT ATRIUM             Index       RIGHT ATRIUM           Index LA diam:        3.30 cm 1.90 cm/m  RA Area:     12.00 cm LA Vol (A2C):   46.5 ml 26.79 ml/m RA Volume:   27.50 ml  15.84 ml/m LA Vol (A4C):   26.9 ml 15.50 ml/m LA Biplane Vol: 34.3 ml 19.76 ml/m  AORTIC VALVE                    PULMONIC VALVE AV Area (Vmax):    2.12 cm     PR End Diast Vel: 1.42 msec AV Area (Vmean):   1.82 cm AV Area (VTI):     1.93 cm AV Vmax:           249.00 cm/s AV Vmean:          189.000 cm/s AV VTI:            0.550 m AV Peak Grad:      24.8 mmHg AV Mean Grad:      16.0 mmHg LVOT Vmax:         127.00 cm/s LVOT Vmean:        82.600 cm/s LVOT VTI:          0.255 m LVOT/AV VTI ratio: 0.46  AORTA Ao Root diam: 4.20 cm Ao Asc diam:  4.10 cm MITRAL VALVE MV Area (PHT): 4.49 cm     SHUNTS MV Area VTI:   3.41 cm     Systemic VTI:  0.26 m MV Peak grad:  9.1 mmHg     Systemic Diam: 2.30 cm MV Mean grad:  3.5 mmHg MV Vmax:       1.51 m/s MV Vmean:      84.4 cm/s MV Decel Time: 169 msec MV E velocity: 111.00 cm/s MV A velocity: 133.00 cm/s MV E/A ratio:  0.83 Fransico Him MD Electronically signed by Fransico Him MD Signature Date/Time: 12/19/2020/11:22:31 AM    Final (Updated)  VAS US CAROTID  Result Date: 12/19/2020 Carotid Arterial Duplex Study Patient Name:  BURNIS HALLING  Date of  Exam:   12/19/2020 Medical Rec #: [id number]        Accession #:    [id number] Date of Birth: April 17, 1932        Patient Gender: M Patient Age:   54 years Exam Location:  [facility] Procedure:      VAS US CAROTID Referring Phys: Lesleigh [name] --------------------------------------------------------------------------------  Indications:      CVA. Risk Factors:     Hypertension, hyperlipidemia, Diabetes, coronary artery                   disease. Comparison Study: 09/27/20 Performing Technologist: Archie Patten RVS  Examination Guidelines: A complete evaluation includes B-mode imaging, spectral Doppler, color Doppler, and power Doppler as needed of all accessible portions of each vessel. Bilateral testing is considered an integral part of a complete examination. Limited examinations for reoccurring indications may be performed as noted.  Right Carotid Findings: +---------+--------+-------+--------+---------------------------------+--------+          PSV cm/sEDV    StenosisPlaque Description               Comments                  cm/s                                                     +---------+--------+-------+--------+---------------------------------+--------+ CCA Prox 72      13             heterogenous                              +---------+--------+-------+--------+---------------------------------+--------+ CCA      63      14             heterogenous                              Distal                                                                    +---------+--------+-------+--------+---------------------------------+--------+ ICA Prox 428     122    80-99%  heterogenous, calcific and                                                irregular                                 +---------+--------+-------+--------+---------------------------------+--------+ ICA Mid  327     72                                                        +---------+--------+-------+--------+---------------------------------+--------+  ICA      152     39                                                       Distal                                                                    +---------+--------+-------+--------+---------------------------------+--------+ ECA      114                                                              +---------+--------+-------+--------+---------------------------------+--------+ +----------+--------+-------+--------+-------------------+           PSV cm/sEDV cmsDescribeArm Pressure (mmHG) +----------+--------+-------+--------+-------------------+ [id number]                                        +----------+--------+-------+--------+-------------------+ +---------+--------+--+--------+--+---------+ VertebralPSV cm/s56EDV cm/s17Antegrade +---------+--------+--+--------+--+---------+  Left Carotid Findings: +----------+--------+--------+--------+------------------+--------+           PSV cm/sEDV cm/sStenosisPlaque DescriptionComments +----------+--------+--------+--------+------------------+--------+ CCA Prox  112     17              heterogenous               +----------+--------+--------+--------+------------------+--------+ CCA Distal115     18              heterogenous               +----------+--------+--------+--------+------------------+--------+ ICA Prox  120     24      1-39%   heterogenous               +----------+--------+--------+--------+------------------+--------+ ICA Distal74      21                                         +----------+--------+--------+--------+------------------+--------+ ECA       119                                                +----------+--------+--------+--------+------------------+--------+ +----------+--------+--------+--------+-------------------+           PSV cm/sEDV cm/sDescribeArm Pressure (mmHG)  +----------+--------+--------+--------+-------------------+ [id number]                                          +----------+--------+--------+--------+-------------------+ +---------+--------+--+--------+--+---------+ VertebralPSV cm/s58EDV cm/s19Antegrade +---------+--------+--+--------+--+---------+   Summary: Right Carotid: Velocities in the right ICA are consistent with a 80-99%                stenosis. Left Carotid: Velocities in  the left ICA are consistent with a 1-39% stenosis. Vertebrals: Bilateral vertebral arteries demonstrate antegrade flow. *See table(s) above for measurements and observations.     Preliminary      LOS: 1 day   Oren Binet, MD  [facility]    To contact the attending provider between 7A-7P or the covering provider during after hours 7P-7A, please log into the web site [email or web address] and access using universal [place] password for that web site. If you do not have the password, please call the hospital operator.  12/20/2020, 12:15 PM

## 2020-12-20 NOTE — Progress Notes (Signed)
Physical Therapy Treatment Patient Details Name: John Walton MRN: [id number] DOB: [birth year] Today's Date: 12/20/2020   History of Present Illness Pt is an 87 y/o male admitted 9/19 secondary to dysarthria, visual deficits, and L hand numbness. 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 Pt also found to have R carotid stenosis and per notes likely for Right carotid endarterectomy during admission. PMH includes glaucoma, CKD, DM, HTN, PVD.    PT Comments    Pt was seen for mobility in his room with pt mainly being concerned about his catheter being off.  Nursing in to replace it, but deferred to PT to see him first.  Pt wore a paper garment for security, and was able to walk 100' with min guard around the room in laps.  He is perseverating on his history of injuries, and repeats that he is not in pain.   Cardiology in to discuss his pending procedure, and will follow along with him to increase strength and safety of mobility as he tolerates before and after procedure.  Will need arrangements for his incontinence as pt states it is not controllable if it happens.      Recommendations for follow up therapy are one component of a multi-disciplinary discharge planning process, led by the attending physician.  Recommendations may be updated based on patient status, additional functional criteria and insurance authorization.  Follow Up Recommendations  CIR     Equipment Recommendations  Other (comment)    Recommendations for Other Services Rehab consult     Precautions / Restrictions Precautions Precautions: Fall Precaution Comments: peg Restrictions Weight Bearing Restrictions: No     Mobility  Bed Mobility Overal bed mobility: Needs Assistance             General bed mobility comments: up in chair when PT arrives    Transfers Overall transfer level: Needs assistance Equipment used: Rolling walker (2 wheeled) Transfers: Sit to/from Stand Sit to  Stand: Min guard;Min assist         General transfer comment: pt requesting PT not to touch him to assist him but is not quite safe alone  Ambulation/Gait Ambulation/Gait assistance: Min guard Gait Distance (Feet): 100 Feet (walking in his room) Assistive device: Rolling walker (2 wheeled) Gait Pattern/deviations: Step-through pattern;Wide base of support (wide turns) Gait velocity: reduced Gait velocity interpretation: <1.31 ft/sec, indicative of household ambulator General Gait Details: pt has on a paper garment to give him the freedom to walk in the room   Stairs             Wheelchair Mobility    Modified Rankin (Stroke Patients Only)       Balance Overall balance assessment: Needs assistance Sitting-balance support: Feet supported Sitting balance-Leahy Scale: Good     Standing balance support: Bilateral upper extremity supported;During functional activity Standing balance-Leahy Scale: Fair Standing balance comment: less than fair dynamically                            Cognition Arousal/Alertness: Awake/alert Behavior During Therapy: WFL for tasks assessed/performed Overall Cognitive Status: Difficult to assess                                 General Comments: pt is continually asking PT for direction on what to do next, but is distracted by his thoughts about his recent and distant history of  medical issues      Exercises General Exercises - Lower Extremity Ankle Circles/Pumps: AROM;5 reps Long Arc Quad: Strengthening;10 reps Heel Slides: Strengthening;10 reps Hip ABduction/ADduction: Strengthening;10 reps    General Comments General comments (skin integrity, edema, etc.): pt was up to stadn at chair and walk, wife in room and pt very excited about his history he wanted to discuss      Pertinent Vitals/Pain Pain Assessment: Faces Faces Pain Scale: No hurt    Home Living                      Prior Function             PT Goals (current goals can now be found in the care plan section) Acute Rehab PT Goals Patient Stated Goal: home with wife Progress towards PT goals: Progressing toward goals    Frequency    Min 4X/week      PT Plan Current plan remains appropriate    Co-evaluation              AM-PAC PT "6 Clicks" Mobility   Outcome Measure  Help needed turning from your back to your side while in a flat bed without using bedrails?: A Little Help needed moving from lying on your back to sitting on the side of a flat bed without using bedrails?: A Little Help needed moving to and from a bed to a chair (including a wheelchair)?: A Little Help needed standing up from a chair using your arms (e.g., wheelchair or bedside chair)?: A Little Help needed to walk in hospital room?: A Little Help needed climbing 3-5 steps with a railing? : A Lot 6 Click Score: 17    End of Session   Activity Tolerance: Patient tolerated treatment well Patient left: in chair;with call bell/phone within reach;with chair alarm set Nurse Communication: Mobility status PT Visit Diagnosis: Unsteadiness on feet (R26.81);Muscle weakness (generalized) (M62.81)     Time: 5329-9242 PT Time Calculation (min) (ACUTE ONLY): 24 min  Charges:  $Gait Training: 8-22 mins $Therapeutic Exercise: 8-22 mins            Ramond Dial 12/20/2020, 4:22 PM  Mee Hives, PT MS Acute Rehab Dept. Number: [place]

## 2020-12-20 NOTE — Progress Notes (Signed)
   VASCULAR SURGERY ASSESSMENT & PLAN:   SYMPTOMATIC RIGHT CAROTID STENOSIS: This patient has a greater than 80% symptomatic right carotid stenosis.  I have recommended right carotid endarterectomy in order to lower his risk of future stroke.  He is scheduled for surgery on Friday.  He is on aspirin and is on a statin.   SUBJECTIVE:   No specific complaints this morning.  PHYSICAL EXAM:   Vitals:   12/19/20 1604 12/19/20 2012 12/19/20 2336 12/20/20 0428  BP: (!) 138/58 (!) 154/75 140/74 136/66  Pulse: 88 94 98 84  Resp: 18 18 16 16   Temp: 97.9 F (36.6 C) 98.3 F (36.8 C) 98.1 F (36.7 C) 97.8 F (36.6 C)  TempSrc: Oral Oral Oral Oral  SpO2: 99% 100% 98% 98%  Weight:      Height:       Good strength in the upper extremities and lower extremities bilaterally.  LABS:   Lab Results  Component Value Date   WBC 5.0 12/20/2020   HGB 7.5 (L) 12/20/2020   HCT 22.6 (L) 12/20/2020   MCV 86.6 12/20/2020   PLT 268 12/20/2020   Lab Results  Component Value Date   CREATININE 1.92 (H) 12/20/2020   Lab Results  Component Value Date   INR 1.2 12/18/2020   CBG (last 3)  Recent Labs    12/19/20 1658 12/19/20 2056 12/20/20 0531  GLUCAP 141* 178* 112*    PROBLEM LIST:    Principal Problem:   Acute ischemic stroke (HCC) Active Problems:   Essential hypertension   CKD (chronic kidney disease), stage IV (HCC)   Dysphagia   Protein-calorie malnutrition, severe   DM (diabetes mellitus), type 2 (HCC)   S/P TURP (status post transurethral resection of prostate)   ABLA (acute blood loss anemia)   Symptomatic stenosis of right carotid artery   CURRENT MEDS:    aspirin EC  325 mg Oral Daily   atorvastatin  40 mg Oral QHS   brinzolamide  1 drop Both Eyes BID   And   brimonidine  1 drop Both Eyes BID   calcium-vitamin D  1 tablet Oral TID with meals   collagenase  1 application Topical QODAY   feeding supplement (PROSource TF)  45 mL Per Tube BID   free water  120 mL  Per Tube BID   heparin injection (subcutaneous)  5,000 Units Subcutaneous Q8H   insulin aspart  0-9 Units Subcutaneous TID WC   latanoprost  1 drop Both Eyes QHS   Lifitegrast  1 drop Both Eyes BID   methimazole  10 mg Oral Q breakfast   polyvinyl alcohol  1 drop Both Eyes TID   sucralfate  1 g Oral TID [employer] Office: [phone] 12/20/2020

## 2020-12-20 NOTE — Progress Notes (Signed)
Inpatient Rehabilitation Admissions Coordinator   Inpatient rehab consult received, I met wt bedside with patient and his wife. I discussed goals and expectations of a possible Cir . I explained that I will follow up postoperatively with his progress to continue to assess if CIR admit likely. He is [place] primary and Medicare secondary per wife therefore [facility] would have to approve after therapy progress postoperatively.  I have notified the pre service center.  Danne Baxter, RN, MSN Rehab Admissions Coordinator [phone] 12/20/2020 3:34 PM

## 2020-12-20 NOTE — Consult Note (Addendum)
Cardiology Consultation:   Patient ID: John Walton MRN: [id number]; DOB: [birth year]  Admit date: 12/18/2020 Date of Consult: 12/20/2020  PCP:  [name], MD   [facility] Providers Cardiologist:  None   {    Patient Profile:   John Walton is a 87 y.o. 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 Cardiology is consulted for pre-op evaluation of right CEA today at the request of Dr Sloan Leiter.  History of Present Illness:   John Walton has no known cardiac disease  and has not seen a cardiologist in the past.   He is currently admitted for right vision changes and left arm weakness and numbness due to patchy multifocal acute ischemic infarcts involving the corticalgray matter of the right frontal, parietal, and occipital lobes. Etiology of CVA was felt due to right high grade ICA stenosis per neurology. He was seen by vascular surgery for symptomatic right carotid stenosis >80% and right carotid endarterectomy was recommended. Cardiology is consulted for pre-op evaluation at the request of Dr Sloan Leiter.  Echo from 12/19/20 showed EF 55-60%, hypokinesis of LV, apical inferior wall, trivial MR, trivial AR, mild to moderate AS with aortic valve area, by VTI measures 1.93 cm. Aortic valve mean gradient measures 16.0 mmHg. Aortic valve Vmax measures 2.49 m/s. mild dilatation of the aortic root 42 mm, mild dilatation of the ascending aorta 41 mm.  He is hard of hearing. He states he has never been diagnosed with any cardiac disease in the past. He denied hx of MI, CAD, CHF, arrhythmia. His wife is at bedside, states he had EKG done in the past and had "electrical blockage". He denied any chest pain or pressure, SOB, dizziness, syncope, orthopnea, PND. He recalls passed out once 5 yrs ago due to vertigo. He  is active, uses walker to ambulate, able to walk up one flight of stairs with some exertional SOB, states he is exercise regularly and walks a lot at home without problems. He is able to feed himself, needs assistance for shower. He is having good PO intake, denied any bowel issue. He had TURP where he no longer need Foley, but is incontinent of urine at times.   Labs today with BUN 38,  Cr 1.92 and GFR 34. Iron deficient. Hgb 7.5. TSH WNL. CXR at admission showed no acute findings. EKG at admission showed SR with old RBBB. Telemetry showed SR with occasional PVCs. He is afebrile, AP 80s, normotensive, non-hypoxic on exam.      Past Medical History:  Diagnosis Date   Anemia    Chicken pox    Chronic kidney disease    Coronary artery disease    Diabetes mellitus without complication (HCC)    Frequent headaches    Glaucoma    Hay fever    History of blood transfusion    HTN (hypertension)    Hyperlipidemia    Hyperlipidemia    Hypothyroidism    Migraines    Peripheral vascular disease ([place])    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[facility])     Past Surgical History:  Procedure Laterality Date   CHOLECYSTECTOMY, LAPAROSCOPIC     IR GASTROSTOMY TUBE MOD SED  03/29/2020   TONSILLECTOMY  04/01/1940   TOTAL HIP ARTHROPLASTY Left 03/08/2020   Procedure: TOTAL HIP ARTHROPLASTY;  Surgeon: Marybelle Killings, MD;  Location: [facility];  Service: Orthopedics;  Laterality: Left;   TRANSURETHRAL RESECTION OF PROSTATE N/A 12/14/2020   Procedure: TRANSURETHRAL RESECTION OF THE PROSTATE (TURP);  Surgeon: Franchot Gallo, MD;  Location: [facility];  Service: Urology;  Laterality: N/A;  2 HRS     Home Medications:  Prior to Admission medications   Medication Sig Start Date End Date Taking? Authorizing Provider  atorvastatin (LIPITOR) 40 MG tablet Place 1 tablet (40 mg total) into feeding tube daily. Patient taking differently: Take 40 mg by mouth at bedtime. 04/03/20  Yes Aline August, MD  calcium-vitamin D  (OSCAL WITH D) 500-200 MG-UNIT tablet Place 1 tablet into feeding tube with breakfast, with lunch, and with evening meal. Patient taking differently: Take 1 tablet by mouth with breakfast, with lunch, and with evening meal. 04/03/20 11/17/21 Yes Alekh, Kshitiz, MD  carboxymethylcellulose (REFRESH PLUS) 0.5 % SOLN Place 1 drop into both eyes in the morning, at noon, and at bedtime.   Yes [provider]  collagenase (SANTYL) ointment Apply 1 application topically every other day. APPLIED TO HEEL WOUND   Yes [provider]  HYDROcodone-Acetaminophen (NORCO PO) Take 1 tablet by mouth every 6 (six) hours as needed (pain).   Yes [provider]  latanoprost (XALATAN) 0.005 % ophthalmic solution Place 1 drop into both eyes at bedtime.  03/13/19  Yes [provider]  Lifitegrast Shirley Friar) 5 % SOLN Place 1 drop into both eyes in the morning and at bedtime.   Yes [provider]  methimazole (TAPAZOLE) 10 MG tablet Take 10 mg by mouth in the morning.   Yes [provider]  Nutritional Supplements (PROSOURCE) LIQD Give 30 mLs by tube in the morning and at bedtime.   Yes [provider]  SIMBRINZA 1-0.2 % SUSP Place 1 drop into both eyes in the morning and at bedtime.  05/16/19  Yes [provider]  sucralfate (CARAFATE) 1 GM/10ML suspension Take 10 mLs (1 g total) by mouth 4 (four) times daily -  with meals and at bedtime. Patient taking differently: Take 1 g by mouth 3 (three) times daily before meals. 04/03/20  Yes Aline August, MD  Water For Irrigation, Sterile (FREE WATER) SOLN Place 120 mLs into feeding tube 5 (five) times daily. Patient taking differently: Place 120 mLs into feeding tube in the morning and at bedtime. 04/03/20  Yes Aline August, MD  White Petrolatum-Mineral Oil (TEARS AGAIN) OINT Place 1 application into both eyes in the morning and at bedtime.   Yes [provider]  Insulin Pen Needle 29G X 12.7MM MISC Use as  directed 03/13/20   Allie Bossier, MD    Inpatient Medications: Scheduled Meds:  aspirin EC  325 mg Oral Daily   atorvastatin  40 mg Oral QHS   brinzolamide  1 drop Both Eyes BID   And   brimonidine  1 drop Both Eyes BID   calcium-vitamin D  1 tablet Oral TID with meals   collagenase  1 application Topical QODAY   cyanocobalamin  1,000 mcg Subcutaneous Daily   feeding supplement (PROSource TF)  45 mL Per Tube BID   free water  120 mL Per Tube BID   heparin injection (subcutaneous)  5,000 Units Subcutaneous Q8H   insulin aspart  0-9 Units Subcutaneous TID WC   latanoprost  1 drop Both Eyes QHS   Lifitegrast  1 drop Both Eyes BID   methimazole  10 mg Oral Q breakfast   polyvinyl alcohol  1  drop Both Eyes TID   sucralfate  1 g Oral TID AC   Continuous Infusions:  PRN Meds: acetaminophen **OR** acetaminophen (TYLENOL) oral liquid 160 mg/5 mL **OR** acetaminophen, HYDROcodone-acetaminophen  Allergies:    Allergies  Allergen Reactions   Other Swelling    ALL COUGH MEDICATIONS   Robitussin Cold Cough+ Chest [Dextromethorphan-Guaifenesin] Swelling    Laryngeal edema - any cough syrup    Social History:   Social History   Socioeconomic History   Marital status: Married    Spouse name: Not on file   Number of children: Not on file   Years of education: Not on file   Highest education level: Not on file  Occupational History   Not on file  Tobacco Use   Smoking status: Former    Types: Cigarettes    Start date: 04/01/1968    Quit date: 01/11/1977    Years since quitting: 43.9   Smokeless tobacco: Never  Vaping Use   Vaping Use: Never used  Substance and Sexual Activity   Alcohol use: Not Currently   Drug use: No   Sexual activity: Never  Other Topics Concern   Not on file  Social History Narrative   Not on file   Social Determinants of Health   Financial Resource Strain: Not on file  Food Insecurity: Not on file  Transportation Needs: Not on file  Physical  Activity: Not on file  Stress: Not on file  Social Connections: Not on file  Intimate Partner Violence: Not on file    Family History:    Family History  Problem Relation Age of Onset   Liver disease Mother    Skin cancer Father    Skin cancer Paternal Grandfather    Diabetes Maternal Grandfather      ROS:  Constitutional: Denied fever, chills, malaise, night sweats Eyes: Denied vision change or loss Ears/Nose/Mouth/Throat: Denied ear ache, sore throat, coughing, sinus pain Cardiovascular: see HPI  Respiratory:exertional SOB Gastrointestinal: Denied nausea, vomiting, abdominal pain, diarrhea Genital/Urinary: see HPI  Musculoskeletal: mild global decondition and weakness  Skin: Denied rash, wound Neuro: Denied headache, dizziness, syncope Psych: Denied history of depression/anxiety  Endocrine: history of diabetes    Physical Exam/Data:   Vitals:   12/19/20 2012 12/19/20 2336 12/20/20 0428 12/20/20 0857  BP: (!) 154/75 140/74 136/66 (!) 147/62  Pulse: 94 98 84 80  Resp: 18 16 16 17   Temp: 98.3 F (36.8 C) 98.1 F (36.7 C) 97.8 F (36.6 C) 98 F (36.7 C)  TempSrc: Oral Oral Oral Oral  SpO2: 100% 98% 98% 98%  Weight:      Height:        Intake/Output Summary (Last 24 hours) at 12/20/2020 1538 Last data filed at 12/20/2020 0529 Gross per 24 hour  Intake 360 ml  Output 601 ml  Net -241 ml   Last 3 Weights 12/18/2020 12/14/2020 10/02/2020  Weight (lbs) 143 lb 4.8 oz 142 lb 8 oz 144 lb  Weight (kg) 65 kg 64.638 kg 65.318 kg     Body mass index is 23.13 kg/m.   Vitals:  Vitals:   12/20/20 0428 12/20/20 0857  BP: 136/66 (!) 147/62  Pulse: 84 80  Resp: 16 17  Temp: 97.8 F (36.6 C) 98 F (36.7 C)  SpO2: 98% 98%   General Appearance: In no apparent distress, sitting in chair  HEENT: Normocephalic, atraumatic.Marland Kitchen Neck: Supple, trachea midline, no JVDs, + right carotid bruits. Cardiovascular: Regular rate and rhythm, normal I4-P3, systolic murmur grade  II of  RUSB Respiratory: Resting breathing unlabored, lungs sounds clear to auscultation bilaterally, no use of accessory muscles. On room air.  No wheezes, rales or rhonchi.   Gastrointestinal: Bowel sounds positive, abdomen soft, non-tender, non-distended.  Extremities: Able to move all extremities in bed without difficulty, trace BLE edema  Genitourinary: Suction Foley with amber color urine noted  Musculoskeletal: Normal muscle bulk and tone, mild global weakness and decondition Skin: Intact, warm, dry. No rashes or petechiae noted in exposed areas.  Neurologic: Alert, oriented to person, place and time. HOH. Fluent speech, follows commands appropriately, moving all extremity spontaneously Psychiatric: Normal affect. Mood is appropriate.    EKG:  The EKG was personally reviewed and demonstrates:  SR with old RBBB  Telemetry:  Telemetry was personally reviewed and demonstrates:  SR with occasional PVCs   Relevant CV Studies:  Echo from 12/19/20:  1. Left ventricular ejection fraction, by estimation, is 55 to 60%. The  left ventricle has normal function. The left ventricle demonstrates  regional wall motion abnormalities (see scoring diagram/findings for  description). Left ventricular diastolic  parameters were normal. There is hypokinesis of the left ventricular,  apical inferior wall.   2. Right ventricular systolic function is normal. The right ventricular  size is normal. Tricuspid regurgitation signal is inadequate for assessing  PA pressure.   3. The mitral valve is normal in structure. Trivial mitral valve  regurgitation. No evidence of mitral stenosis.   4. The aortic valve is calcified. There is severe calcifcation of the  aortic valve. There is severe thickening of the aortic valve. Aortic valve  regurgitation is trivial. Mild to moderate aortic valve stenosis. Aortic  valve area, by VTI measures 1.93  cm. Aortic valve mean gradient measures 16.0 mmHg. Aortic valve Vmax   measures 2.49 m/s.   5. Aortic dilatation noted. There is mild dilatation of the aortic root,  measuring 42 mm. There is mild dilatation of the ascending aorta,  measuring 41 mm.   Laboratory Data:  High Sensitivity Troponin:  No results for input(s): TROPONINIHS in the last 720 hours.   Chemistry Recent Labs  Lab 12/18/20 1944 12/18/20 1952 12/19/20 0742 12/20/20 0332  NA 136 138 138 138  K 4.9 4.6 4.1 4.2  CL 108 109 111 109  CO2 21*  --  19* 20*  GLUCOSE 160* 159* 89 115*  BUN 43* 42* 42* 38*  CREATININE 2.45* 2.50* 2.29* 1.92*  CALCIUM 8.4*  --  8.1* 8.1*  GFRNONAA 25*  --  27* 34*  ANIONGAP 7  --  8 9    Recent Labs  Lab 12/18/20 1944 12/20/20 0332  PROT 6.9  --   ALBUMIN 3.0* 2.2*  AST 13*  --   ALT 8  --   ALKPHOS 83  --   BILITOT 0.1*  --    Lipids  Recent Labs  Lab 12/19/20 0742  CHOL 114  TRIG 99  HDL 36*  LDLCALC 58  CHOLHDL 3.2    Hematology Recent Labs  Lab 12/18/20 1944 12/18/20 1952 12/19/20 0742 12/20/20 0332  WBC 11.4*  --  6.9 5.0  RBC 2.59*  --  2.67* 2.61*  2.59*  HGB 7.2* 7.1* 7.6* 7.5*  HCT 23.4* 21.0* 24.2* 22.6*  MCV 90.3  --  90.6 86.6  MCH 27.8  --  28.5 28.7  MCHC 30.8  --  31.4 33.2  RDW 14.1  --  14.0 13.9  PLT 380  --  277 268  Thyroid  Recent Labs  Lab 12/19/20 0800 12/19/20 0803  TSH  --  1.259  FREET4 1.06  --     BNPNo results for input(s): BNP, PROBNP in the last 168 hours.  DDimer No results for input(s): DDIMER in the last 168 hours.   Radiology/Studies:  CT HEAD WO CONTRAST  Result Date: 12/18/2020 CLINICAL DATA:  TIA. EXAM: CT HEAD WITHOUT CONTRAST TECHNIQUE: Contiguous axial images were obtained from the base of the skull through the vertex without intravenous contrast. COMPARISON:  CT head 03/07/2020. FINDINGS: Brain: No evidence of acute infarction, hemorrhage, hydrocephalus, extra-axial collection or mass lesion/mass effect. There is stable mild diffuse atrophy. There is also stable mild  periventricular white matter hypodensity, likely chronic small vessel ischemic change. 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 Vascular: Atherosclerotic calcifications are present within the cavernous internal carotid arteries. Skull: Normal. Negative for fracture or focal lesion. Sinuses/Orbits: No acute finding. Other: None. IMPRESSION: No acute intracranial abnormality. Electronically Signed   By: Ronney Asters M.D.   On: 12/18/2020 21:12   MR ANGIO HEAD WO CONTRAST  Result Date: 12/19/2020 CLINICAL DATA:  Stroke, follow-up EXAM: MRA HEAD WITHOUT CONTRAST TECHNIQUE: Angiographic images of the Circle of Willis were acquired using MRA technique without intravenous contrast. COMPARISON:  No pertinent prior exam. FINDINGS: Intracranial internal carotid arteries are patent. Middle and anterior cerebral arteries are patent. Intracranial vertebral arteries, basilar artery, posterior cerebral arteries are patent. Bilateral posterior communicating arteries are present. There is no significant stenosis or aneurysm. IMPRESSION: Normal MRA head. Electronically Signed   By: Macy Mis M.D.   On: 12/19/2020 12:32   MR BRAIN WO CONTRAST  Result Date: 12/19/2020 CLINICAL DATA:  Initial evaluation for neuro deficit, stroke suspected. EXAM: MRI HEAD WITHOUT CONTRAST TECHNIQUE: Multiplanar, multiecho pulse sequences of the brain and surrounding structures were obtained without intravenous contrast. COMPARISON:  CT from earlier the same day. FINDINGS: Brain: Generalized age-related cerebral atrophy. Patchy and confluent T2/FLAIR hyperintensity involving the periventricular deep white matter both cerebral hemispheres as well as the pons, most consistent with chronic small vessel ischemic disease moderate in nature. Encephalomalacia and gliosis involving the anterior right frontal region consistent with a chronic right MCA distribution infarct. Patchy multifocal areas of  restricted diffusion seen involving the cortical gray matter of the right frontal, parietal, and occipital lobes, consistent with acute ischemic infarcts. These measure up to approximately 1 cm in size, and are somewhat watershed in distribution. No associated hemorrhage or mass effect. No other evidence for acute or subacute ischemia. Gray-white matter differentiation otherwise maintained. Few scattered additional chronic micro hemorrhages noted involving the right greater than left cerebral hemispheres, likely small vessel/hypertensive in nature. No mass lesion, midline shift or mass effect. No hydrocephalus or extra-axial fluid collection. Pituitary gland suprasellar region within normal limits. Midline structures intact. Vascular: Major intracranial vascular flow voids are grossly maintained. Skull and upper cervical spine: Craniocervical junction within normal limits. Bone marrow signal intensity normal. No scalp soft tissue abnormality. Sinuses/Orbits: Prior bilateral ocular lens replacement. Mild scattered mucosal thickening noted within the ethmoidal air cells. Paranasal sinuses are otherwise clear. No significant mastoid effusion. Inner ear structures grossly normal. Other: None. IMPRESSION: 1. Patchy multifocal acute ischemic infarcts involving the cortical gray matter of the right frontal, parietal, and occipital lobes as above. No associated hemorrhage or mass effect. 2. 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 3. Underlying moderate chronic microvascular ischemic disease. Electronically  Signed   By: Jeannine Boga M.D.   On: 12/19/2020 00:18   ECHOCARDIOGRAM COMPLETE  Result Date: 12/19/2020    ECHOCARDIOGRAM REPORT   Patient Name:   TYDUS SANMIGUEL Date of Exam: 12/19/2020 Medical Rec #:  [id number]       Height:       66.0 in Accession #:    [id number]      Weight:       143.3 lb Date of Birth:  [birth year]       BSA:          1.736 m Patient Age:    14 years         BP:           126/60 mmHg Patient Gender: M               HR:           57 bpm. Exam Location:  Inpatient Procedure: 2D Echo, Cardiac Doppler and Color Doppler Indications:     Stroke  History:         Patient has prior history of Echocardiogram examinations, most                  recent 03/08/2020. Abnormal ECG, Arrythmias:RBBB; Risk                  Factors:Hypertension and Dyslipidemia.  Sonographer:     Roseanna Rainbow RDCS Referring Phys:  [id number] [employer] [facility] Diagnosing Phys: Fransico Him MD IMPRESSIONS  1. Left ventricular ejection fraction, by estimation, is 55 to 60%. The left ventricle has normal function. The left ventricle demonstrates regional wall motion abnormalities (see scoring diagram/findings for description). Left ventricular diastolic parameters were normal. There is hypokinesis of the left ventricular, apical inferior wall.  2. Right ventricular systolic function is normal. The right ventricular size is normal. Tricuspid regurgitation signal is inadequate for assessing PA pressure.  3. The mitral valve is normal in structure. Trivial mitral valve regurgitation. No evidence of mitral stenosis.  4. The aortic valve is calcified. There is severe calcifcation of the aortic valve. There is severe thickening of the aortic valve. Aortic valve regurgitation is trivial. Mild to moderate aortic valve stenosis. Aortic valve area, by VTI measures 1.93 cm. Aortic valve mean gradient measures 16.0 mmHg. Aortic valve Vmax measures 2.49 m/s.  5. Aortic dilatation noted. There is mild dilatation of the aortic root, measuring 42 mm. There is mild dilatation of the ascending aorta, measuring 41 mm. FINDINGS  Left Ventricle: Left ventricular ejection fraction, by estimation, is 55 to 60%. The left ventricle has normal function. The left ventricle demonstrates regional wall motion abnormalities. The left ventricular internal cavity size was normal in size. There is no left ventricular hypertrophy. Left ventricular  diastolic parameters were normal. Normal left ventricular filling pressure. Right Ventricle: The right ventricular size is normal. No increase in right ventricular wall thickness. Right ventricular systolic function is normal. Tricuspid regurgitation signal is inadequate for assessing PA pressure. Left Atrium: Left atrial size was normal in size. Right Atrium: Right atrial size was normal in size. Pericardium: There is no evidence of pericardial effusion. Mitral Valve: The mitral valve is normal in structure. Trivial mitral valve regurgitation. No evidence of mitral valve stenosis. MV peak gradient, 9.1 mmHg. The mean mitral valve gradient is 3.5 mmHg. Tricuspid Valve: The tricuspid valve is normal in structure. Tricuspid valve regurgitation is trivial. No evidence of tricuspid stenosis. Aortic Valve: The aortic valve is  calcified. There is severe calcifcation of the aortic valve. There is severe thickening of the aortic valve. Aortic valve regurgitation is trivial. Mild to moderate aortic stenosis is present. Aortic valve mean gradient measures 16.0 mmHg. Aortic valve peak gradient measures 24.8 mmHg. Aortic valve area, by VTI measures 1.93 cm. Pulmonic Valve: The pulmonic valve was normal in structure. Pulmonic valve regurgitation is mild. No evidence of pulmonic stenosis. Aorta: Aortic dilatation noted. There is mild dilatation of the aortic root, measuring 42 mm. There is mild dilatation of the ascending aorta, measuring 41 mm. Venous: The inferior vena cava was not well visualized. IAS/Shunts: No atrial level shunt detected by color flow Doppler.  LEFT VENTRICLE PLAX 2D LVIDd:         4.60 cm     Diastology LVIDs:         3.30 cm     LV e' medial:    8.49 cm/s LV PW:         1.00 cm     LV E/e' medial:  13.1 LV IVS:        1.00 cm     LV e' lateral:   9.79 cm/s LVOT diam:     2.30 cm     LV E/e' lateral: 11.3 LV SV:         106 LV SV Index:   61 LVOT Area:     4.15 cm  LV Volumes (MOD) LV vol d, MOD A2C: 63.4  ml LV vol d, MOD A4C: 89.9 ml LV vol s, MOD A2C: 41.3 ml LV vol s, MOD A4C: 44.5 ml LV SV MOD A2C:     22.1 ml LV SV MOD A4C:     89.9 ml LV SV MOD BP:      35.4 ml RIGHT VENTRICLE             IVC RV S prime:     11.10 cm/s  IVC diam: 1.70 cm TAPSE (M-mode): 2.5 cm LEFT ATRIUM             Index       RIGHT ATRIUM           Index LA diam:        3.30 cm 1.90 cm/m  RA Area:     12.00 cm LA Vol (A2C):   46.5 ml 26.79 ml/m RA Volume:   27.50 ml  15.84 ml/m LA Vol (A4C):   26.9 ml 15.50 ml/m LA Biplane Vol: 34.3 ml 19.76 ml/m  AORTIC VALVE                    PULMONIC VALVE AV Area (Vmax):    2.12 cm     PR End Diast Vel: 1.42 msec AV Area (Vmean):   1.82 cm AV Area (VTI):     1.93 cm AV Vmax:           249.00 cm/s AV Vmean:          189.000 cm/s AV VTI:            0.550 m AV Peak Grad:      24.8 mmHg AV Mean Grad:      16.0 mmHg LVOT Vmax:         127.00 cm/s LVOT Vmean:        82.600 cm/s LVOT VTI:          0.255 m LVOT/AV VTI ratio: 0.46  AORTA Ao Root diam: 4.20 cm Ao Asc diam:  4.10 cm MITRAL VALVE MV Area (PHT): 4.49 cm     SHUNTS MV Area VTI:   3.41 cm     Systemic VTI:  0.26 m MV Peak grad:  9.1 mmHg     Systemic Diam: 2.30 cm MV Mean grad:  3.5 mmHg MV Vmax:       1.51 m/s MV Vmean:      84.4 cm/s MV Decel Time: 169 msec MV E velocity: 111.00 cm/s MV A velocity: 133.00 cm/s MV E/A ratio:  0.83 Fransico Him MD Electronically signed by Fransico Him MD Signature Date/Time: 12/19/2020/11:22:31 AM    Final (Updated)    VAS US CAROTID  Result Date: 12/19/2020 Carotid Arterial Duplex Study Patient Name:  NAKSH RADI  Date of Exam:   12/19/2020 Medical Rec #: [id number]        Accession #:    [id number] Date of Birth: October 01, 1932        Patient Gender: M Patient Age:   54 years Exam Location:  [facility] Procedure:      VAS US CAROTID Referring Phys: Lesleigh [name] --------------------------------------------------------------------------------  Indications:      CVA. Risk Factors:      Hypertension, hyperlipidemia, Diabetes, coronary artery                   disease. Comparison Study: 09/27/20 Performing Technologist: Archie Patten RVS  Examination Guidelines: A complete evaluation includes B-mode imaging, spectral Doppler, color Doppler, and power Doppler as needed of all accessible portions of each vessel. Bilateral testing is considered an integral part of a complete examination. Limited examinations for reoccurring indications may be performed as noted.  Right Carotid Findings: +---------+--------+-------+--------+---------------------------------+--------+          PSV cm/sEDV    StenosisPlaque Description               Comments                  cm/s                                                     +---------+--------+-------+--------+---------------------------------+--------+ CCA Prox 72      13             heterogenous                              +---------+--------+-------+--------+---------------------------------+--------+ CCA      63      14             heterogenous                              Distal                                                                    +---------+--------+-------+--------+---------------------------------+--------+ ICA Prox 428     122    80-99%  heterogenous, calcific and  irregular                                 +---------+--------+-------+--------+---------------------------------+--------+ ICA Mid  327     72                                                       +---------+--------+-------+--------+---------------------------------+--------+ ICA      152     39                                                       Distal                                                                    +---------+--------+-------+--------+---------------------------------+--------+ ECA      114                                                               +---------+--------+-------+--------+---------------------------------+--------+ +----------+--------+-------+--------+-------------------+           PSV cm/sEDV cmsDescribeArm Pressure (mmHG) +----------+--------+-------+--------+-------------------+ [id number]                                        +----------+--------+-------+--------+-------------------+ +---------+--------+--+--------+--+---------+ VertebralPSV cm/s56EDV cm/s17Antegrade +---------+--------+--+--------+--+---------+  Left Carotid Findings: +----------+--------+--------+--------+------------------+--------+           PSV cm/sEDV cm/sStenosisPlaque DescriptionComments +----------+--------+--------+--------+------------------+--------+ CCA Prox  112     17              heterogenous               +----------+--------+--------+--------+------------------+--------+ CCA Distal115     18              heterogenous               +----------+--------+--------+--------+------------------+--------+ ICA Prox  120     24      1-39%   heterogenous               +----------+--------+--------+--------+------------------+--------+ ICA Distal74      21                                         +----------+--------+--------+--------+------------------+--------+ ECA       119                                                +----------+--------+--------+--------+------------------+--------+ +----------+--------+--------+--------+-------------------+  PSV cm/sEDV cm/sDescribeArm Pressure (mmHG) +----------+--------+--------+--------+-------------------+ [id number]                                          +----------+--------+--------+--------+-------------------+ +---------+--------+--+--------+--+---------+ VertebralPSV cm/s58EDV cm/s19Antegrade +---------+--------+--+--------+--+---------+   Summary: Right Carotid: Velocities in the right ICA are consistent with  a 80-99%                stenosis. Left Carotid: Velocities in the left ICA are consistent with a 1-39% stenosis. Vertebrals: Bilateral vertebral arteries demonstrate antegrade flow. *See table(s) above for measurements and observations.     Preliminary      Assessment and Plan:   Pre-op cardiac assessment  - RCRI score 3, 11 % perioperative risk for major cardiac event  - DASI score 21.45, 5.38 METs - Overall, he is at moderate to high risk for CEA due to his advanced age, acute CVA, CKD, DM, etc - no significant cardiac hx in the past, Echo with preserved EF, dicussed with MD, no further cardiac workup is needed at this time for planned CEA, no absolute contraindication for planned surgery   Mild to Mod Aortic stenosis  - occasional BLE edema improves with walking, no CHF symptoms, compensated currently   - will need routine Echo monitor outpatient   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  - no A fib noted on telemetry, care per neurology and IM  Right high grade carotid stenosis, symptomatic  - right CEA planned per vascular surgery   HTN - BP fairly controlled for his age, does not appear on any antihypertensive at home, if SBP >150 after permissive HTN period, may consider initiate low dose Coreg   HLD  - on statin   Type 2 DM  AKI on  CKD IV  BPH  Hyperthyroidism - managed per IM       Risk Assessment/Risk Scores:      :[id number]}          For questions or updates, please contact [facility] Please consult [email or web address] for contact info under    Signed, Margie Billet, NP  12/20/2020 3:38 PM  I have examined the patient and reviewed assessment and plan and discussed with patient.  Agree with above as stated.    Prior to hospitalization, he gets around at home with a walker.  He goes upstairs with minimal shortness of breath.  No anginal symptoms.  He had a TURP a week ago and tolerated this well.  His risk stems from his age and other  comorbidities.  I do not think his risk can be reduced with any further ischemic work-up.  LVEF is normal.  Would proceed with surgery and if any cardiovascular issues come up, we will deal with them postoperatively.  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  He mentions having treatments for migraines, asperger's, and stroke.  He reiterated to me that his company is changing how medicine will be practiced and that " the world needs (him) to live."  [employer]

## 2020-12-21 DIAGNOSIS — N184 Chronic kidney disease, stage 4 (severe): Secondary | ICD-10-CM | POA: Diagnosis not present

## 2020-12-21 DIAGNOSIS — Z7982 Long term (current) use of aspirin: Secondary | ICD-10-CM

## 2020-12-21 DIAGNOSIS — D62 Acute posthemorrhagic anemia: Secondary | ICD-10-CM | POA: Diagnosis not present

## 2020-12-21 DIAGNOSIS — Z79899 Other long term (current) drug therapy: Secondary | ICD-10-CM

## 2020-12-21 DIAGNOSIS — I639 Cerebral infarction, unspecified: Secondary | ICD-10-CM | POA: Diagnosis not present

## 2020-12-21 DIAGNOSIS — D649 Anemia, unspecified: Secondary | ICD-10-CM | POA: Diagnosis not present

## 2020-12-21 LAB — GLUCOSE, CAPILLARY
Glucose-Capillary: 138 mg/dL — ABNORMAL HIGH (ref 70–99)
Glucose-Capillary: 211 mg/dL — ABNORMAL HIGH (ref 70–99)
Glucose-Capillary: 160 mg/dL — ABNORMAL HIGH (ref 70–99)

## 2020-12-21 LAB — BASIC METABOLIC PANEL
Anion gap: 8 (ref 5–15)
BUN: 40 mg/dL — ABNORMAL HIGH (ref 8–23)
CO2: 21 mmol/L — ABNORMAL LOW (ref 22–32)
Calcium: 8.5 mg/dL — ABNORMAL LOW (ref 8.9–10.3)
Chloride: 108 mmol/L (ref 98–111)
Creatinine, Ser: 1.76 mg/dL — ABNORMAL HIGH (ref 0.61–1.24)
GFR, Estimated: 37 mL/min — ABNORMAL LOW (ref >60)
Glucose, Bld: 141 mg/dL — ABNORMAL HIGH (ref 70–99)
Potassium: 4.2 mmol/L (ref 3.5–5.1)
Sodium: 137 mmol/L (ref 135–145)

## 2020-12-21 LAB — PREPARE RBC (CROSSMATCH)

## 2020-12-21 LAB — CBC
HCT: 22.5 % — ABNORMAL LOW (ref 39.0–52.0)
Hemoglobin: 7.4 g/dL — ABNORMAL LOW (ref 13.0–17.0)
MCH: 28.7 pg (ref 26.0–34.0)
MCHC: 32.9 g/dL (ref 30.0–36.0)
MCV: 87.2 fL (ref 80.0–100.0)
Platelets: 305 10*3/uL (ref 150–400)
RBC: 2.58 MIL/uL — ABNORMAL LOW (ref 4.22–5.81)
RDW: 14.1 % (ref 11.5–15.5)
WBC: 6 10*3/uL (ref 4.0–10.5)
nRBC: 0 % (ref 0.0–0.2)

## 2020-12-21 MED ORDER — DIPHENHYDRAMINE HCL 50 MG/ML IJ SOLN
25.0000 mg | Freq: Once | INTRAMUSCULAR | Status: AC
  Administered 2020-12-21: 25 mg via INTRAVENOUS
  Filled 2020-12-21: qty 1

## 2020-12-21 MED ORDER — CEFAZOLIN SODIUM-DEXTROSE 2-4 GM/100ML-% IV SOLN
2.0000 g | INTRAVENOUS | Status: AC
  Administered 2020-12-22: 2 g via INTRAVENOUS
  Filled 2020-12-21: qty 100

## 2020-12-21 MED ORDER — ACETAMINOPHEN 325 MG PO TABS
650.0000 mg | ORAL_TABLET | Freq: Once | ORAL | Status: DC
  Filled 2020-12-21: qty 2

## 2020-12-21 MED ORDER — FERROUS SULFATE 325 (65 FE) MG PO TABS
325.0000 mg | ORAL_TABLET | Freq: Two times a day (BID) | ORAL | Status: DC
  Administered 2020-12-21 – 2020-12-26 (×9): 325 mg via ORAL
  Filled 2020-12-21 (×9): qty 1

## 2020-12-21 MED ORDER — SODIUM CHLORIDE 0.9% IV SOLUTION: Freq: Once | INTRAVENOUS | Status: AC

## 2020-12-21 MED ORDER — CEFUROXIME SODIUM 1.5 G IV SOLR
1.5000 g | INTRAVENOUS | Status: AC
Start: 2020-12-22 — End: 2020-12-23

## 2020-12-21 NOTE — Progress Notes (Signed)
PROGRESS NOTE        PATIENT DETAILS Name: John Walton Age: 87 y.o. Sex: male Date of Birth: 1932/09/08 Admit Date: 12/18/2020 Admitting Physician Etta Quill, DO [id number], Fransico Him, MD  Brief Narrative: Patient is a 87 y.o. male with history of known right carotid artery stenosis, HTN, HLD, DM-2, CKD stage IV-recent TURP on 9/15-presenting with left eye blurry vision, left hand numbness/weakness and dysarthria.  She was found to have acute CVA and subsequently admitted to the hospitalist service.  See below for further details.    Subjective: Awake/alert-no chest pain or shortness of breath.  Denies any left-sided weakness.  Objective: Vitals: Blood pressure (!) 126/54, pulse 84, temperature 98.5 F (36.9 C), temperature source Oral, resp. rate 16, height 5\' 6"  (1.676 m), weight 65 kg, SpO2 100 %.   Exam: Gen Exam:Alert awake-not in any distress HEENT:atraumatic, normocephalic Chest: B/L clear to auscultation anteriorly CVS:S1S2 regular Abdomen:soft non tender, non distended Extremities:no edema Neurology: Non focal Skin: no rash   Pertinent Labs/Radiology: WBC: 6.0 Hb: 7.4 Na:138 K: 4.2 Creatinine: 1.76  Microbiology: 9/19>>Urine Culture: No growth  Stroke work up: 9/19>> CT head: No acute intracranial abnormality. 9/19>> MRI brain: Patchy multifocal acute infarcts involving right frontal/parietal and occipital lobes. 9/20>> A1c: 6.5 9/20>> LDL: 58 9/20>> carotid Doppler: 80-99% stenosis on right, 1-39% on left.   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  No further recommendations from neurology-patient remains on aspirin and statin.  Carotid endarterectomy scheduled for 9/23.  Dysarthria-mild left-sided weakness has resolved.    Symptomatic right carotid artery  stenosis: Vascular surgery following-plans are for carotid endarterectomy on 9/23.  Given the fact that he had wall motion abnormalities on echo-cardiology consulted-okay to proceed with surgery.  Acute blood loss anemia superimposed on anemia due to chronic disease/renal failure: Recent TURP with significant amount of hematuria that seems to be slowly getting better.  Hemoglobin stable after 1 unit of PRBC-on Aranesp-agree with vascular surgery-given her multiple comorbidities we will go ahead and transfuse him 1 unit of PRBC preoperatively  BPH-s/p TURP: Did have hematuria postoperatively-that per patient/spouse has improved markedly over the past few days-only a pink tinge to his urine.  Spoke with patient's primary urologist-Dr. [name] on 9/20-okay to place patient on antiplatelets-okay to proceed with endarterectomy-and okay for intraoperative heparin.  If significant hematuria reoccurs-recommends we place a Foley catheter.  ?  UTI versus asymptomatic bacteriuria: Recent TURP-UA with significant amount of RBCs/WBCs-no symptoms of UTI-given 1 dose of IV Rocephin-cultures are negative-continue to monitor of antimicrobial therapy.  AKI on CKD stage IV: AKI likely hemodynamically mediated in the setting of hematuria/recent surgery-creatinine improving and close to baseline.  Avoid nephrotoxic agents-follow closely.    DM-2: Diet controlled at home-continue SSI-CBG stable.  Recent Labs    12/20/20 1749 12/20/20 2209 12/21/20 0607  GLUCAP 198* 146* 138*     HTN: BP stable-allow permissive hypertension.  HLD: On statin  Hyperthyroidism: On methimazole-TSH stable.  History of transient dysphagia due to severe esophageal spasm in the setting of allergy to Robitussin-PEG tube remains in place: Although patient able to consume orally without any issues.  Per spouse-PEG tube is used for occasional nutritional supplements  Procedures: None Consults: Neurology, vascular surgery,  cardiology DVT Prophylaxis:  SQ Heparin Code Status:Full code Family Communication: Spouse at bedside on 9/20-no one at bedside today.  Time spent: 35 minutes-Greater than 50% of this time was spent in counseling, explanation of diagnosis, planning of further management, and coordination of care.  Diet: Diet Order             Diet NPO time specified Except for: Sips with Meds  Diet effective midnight           Diet NPO time specified Except for: Sips with Meds  Diet effective midnight           Diet Heart Room service appropriate? Yes; Fluid consistency: Thin  Diet effective now                     Disposition Plan: Status is: Inpatient  Remains inpatient appropriate because:Inpatient level of care appropriate due to severity of illness  Dispo: The patient is from: Home              Anticipated d/c is to: Home              Patient currently is not medically stable to d/c.   Difficult to place patient No  Barriers to Discharge: Acute CVA due to right carotid artery stenosis-severe anemia due to blood loss-needs inpatient optimization/neuro/vascular surgery evaluation before consideration of discharge.  Antimicrobial agents: Anti-infectives (From admission, onward)    Start     Dose/Rate Route Frequency Ordered Stop   12/22/20 0800  cefUROXime (ZINACEF) 1.5 g in sodium chloride 0.9 % 100 mL IVPB        1.5 g 200 mL/hr over 30 Minutes Intravenous On call to O.R. 12/21/20 0865 12/23/20 0559   12/22/20 0700  ceFAZolin (ANCEF) IVPB 2g/100 mL premix       Note to Pharmacy: Send with pt to OR   2 g 200 mL/hr over 30 Minutes Intravenous On call 12/21/20 0752 12/23/20 0700   12/18/20 2345  cefTRIAXone (ROCEPHIN) 1 g in sodium chloride 0.9 % 100 mL IVPB        1 g 200 mL/hr over 30 Minutes Intravenous  Once 12/18/20 2343 12/19/20 0120        MEDICATIONS: Scheduled Meds:  aspirin EC  325 mg Oral Daily   atorvastatin  40 mg Oral QHS   brinzolamide  1 drop Both Eyes BID    And   brimonidine  1 drop Both Eyes BID   calcium-vitamin D  1 tablet Oral TID with meals   collagenase  1 application Topical QODAY   cyanocobalamin  1,000 mcg Subcutaneous Daily   feeding supplement (PROSource TF)  45 mL Per Tube BID   ferrous sulfate  325 mg Oral BID WC   free water  120 mL Per Tube BID   heparin injection (subcutaneous)  5,000 Units Subcutaneous Q8H   insulin aspart  0-9 Units Subcutaneous TID WC   latanoprost  1 drop Both Eyes QHS   Lifitegrast  1 drop Both Eyes BID   methimazole  10 mg Oral Q breakfast   polyvinyl alcohol  1 drop Both Eyes TID   sucralfate  1 g Oral TID AC   Continuous Infusions:  [START ON 12/22/2020]  ceFAZolin (ANCEF) IV     [START ON 12/22/2020] cefUROXime (ZINACEF)  IV     PRN Meds:.acetaminophen **OR** acetaminophen (TYLENOL) oral liquid 160 mg/5 mL **OR** acetaminophen, HYDROcodone-acetaminophen   I have personally reviewed following labs and imaging studies  LABORATORY DATA: CBC:  Recent Labs  Lab 12/18/20 1944 12/18/20 1952 12/19/20 0742 12/20/20 0332 12/21/20 0322  WBC 11.4*  --  6.9 5.0 6.0  NEUTROABS 9.1*  --   --   --   --   HGB 7.2* 7.1* 7.6* 7.5* 7.4*  HCT 23.4* 21.0* 24.2* 22.6* 22.5*  MCV 90.3  --  90.6 86.6 87.2  PLT 380  --  277 268 305     Basic Metabolic Panel: Recent Labs  Lab 12/18/20 1944 12/18/20 1952 12/19/20 0742 12/20/20 0332 12/21/20 0322  NA 136 138 138 138 137  K 4.9 4.6 4.1 4.2 4.2  CL 108 109 111 109 108  CO2 21*  --  19* 20* 21*  GLUCOSE 160* 159* 89 115* 141*  BUN 43* 42* 42* 38* 40*  CREATININE 2.45* 2.50* 2.29* 1.92* 1.76*  CALCIUM 8.4*  --  8.1* 8.1* 8.5*  PHOS  --   --   --  4.3  --      GFR: Estimated Creatinine Clearance: 27.2 mL/min (A) (by C-G formula based on SCr of 1.76 mg/dL (H)).  Liver Function Tests: Recent Labs  Lab 12/18/20 1944 12/20/20 0332  AST 13*  --   ALT 8  --   ALKPHOS 83  --   BILITOT 0.1*  --   PROT 6.9  --   ALBUMIN 3.0* 2.2*    No  results for input(s): LIPASE, AMYLASE in the last 168 hours. No results for input(s): AMMONIA in the last 168 hours.  Coagulation Profile: Recent Labs  Lab 12/18/20 1944  INR 1.2     Cardiac Enzymes: No results for input(s): CKTOTAL, CKMB, CKMBINDEX, TROPONINI in the last 168 hours.  BNP (last 3 results) No results for input(s): PROBNP in the last 8760 hours.  Lipid Profile: Recent Labs    12/19/20 0742  CHOL 114  HDL 36*  LDLCALC 58  TRIG 99  CHOLHDL 3.2     Thyroid Function Tests: Recent Labs    12/19/20 0800 12/19/20 0803  TSH  --  1.259  FREET4 1.06  --      Anemia Panel: Recent Labs    12/20/20 0332  VITAMINB12 260  FOLATE 10.2  FERRITIN 120  TIBC 167*  IRON 17*  RETICCTPCT 2.0     Urine analysis:    Component Value Date/Time   COLORURINE YELLOW 12/18/2020 1934   APPEARANCEUR TURBID (A) 12/18/2020 1934   LABSPEC 1.025 12/18/2020 1934   PHURINE 5.5 12/18/2020 1934   GLUCOSEU NEGATIVE 12/18/2020 1934   GLUCOSEU 500 (A) 07/11/2014 0850   HGBUR LARGE (A) 12/18/2020 1934   BILIRUBINUR NEGATIVE 12/18/2020 1934   KETONESUR NEGATIVE 12/18/2020 1934   PROTEINUR >300 (A) 12/18/2020 1934   UROBILINOGEN 0.2 07/11/2014 0850   NITRITE NEGATIVE 12/18/2020 1934   LEUKOCYTESUR SMALL (A) 12/18/2020 1934    Sepsis Labs: Lactic Acid, Venous    Component Value Date/Time   LATICACIDVEN 1.9 07/16/2020 1601    MICROBIOLOGY: Recent Results (from the past 240 hour(s))  SARS Coronavirus 2 (TAT 6-24 hrs)     Status: None   Collection Time: 12/12/20 12:00 AM  Result Value Ref Range Status   SARS Coronavirus 2 RESULT: NEGATIVE  Final    Comment: RESULT: NEGATIVESARS-CoV-2 INTERPRETATION:A NEGATIVE  test result means that SARS-CoV-2 RNA was not present in the specimen above the limit of detection of this test. This does not preclude a possible SARS-CoV-2 infection and should not be used as the  sole basis for patient management decisions.  Negative results  must be combined with clinical observations, patient history, and epidemiological information. Optimum specimen types and timing for peak viral levels during infections caused by SARS-CoV-2  have not been determined. Collection of multiple specimens or types of specimens may be necessary to detect virus. Improper specimen collection and handling, sequence variability under primers/probes, or organism present below the limit of detection may  lead to false negative results. Positive and negative predictive values of testing are highly dependent on prevalence. False negative test results are more likely when prevalence of disease is high.The expected result is NEGATIVE.Fact S heet for  Healthcare Providers: LocalChronicle.no Sheet for Patients: SalonLookup.es Reference Range - Negative   Surgical PCR screen     Status: None   Collection Time: 12/14/20 10:53 PM   Specimen: Nasal Mucosa; Nasal Swab  Result Value Ref Range Status   MRSA, PCR NEGATIVE NEGATIVE Final   Staphylococcus aureus NEGATIVE NEGATIVE Final    Comment: (NOTE) The Xpert SA Assay (FDA approved for NASAL specimens in patients 36 years of age and older), is one component of a comprehensive surveillance program. It is not intended to diagnose infection nor to guide or monitor treatment. Performed at [facility], [place] [address] [place], [place] [address]   Urine Culture     Status: None   Collection Time: 12/18/20  7:34 PM   Specimen: Urine, Clean Catch  Result Value Ref Range Status   Specimen Description URINE, CLEAN CATCH  Final   Special Requests NONE  Final   Culture   Final    NO GROWTH Performed at [facility], [place] [address], [place],  [address]    Report Status 12/20/2020 FINAL  Final  Resp Panel by RT-PCR (Flu A&B, Covid) Nasopharyngeal Swab     Status: None   Collection Time: 12/18/20  7:45 PM   Specimen:  Nasopharyngeal Swab; Nasopharyngeal(NP) swabs in vial transport medium  Result Value Ref Range Status   SARS Coronavirus 2 by RT PCR NEGATIVE NEGATIVE Final    Comment: (NOTE) SARS-CoV-2 target nucleic acids are NOT DETECTED.  The SARS-CoV-2 RNA is generally detectable in upper respiratory specimens during the acute phase of infection. The lowest concentration of SARS-CoV-2 viral copies this assay can detect is 138 copies/mL. A negative result does not preclude SARS-Cov-2 infection and should not be used as the sole basis for treatment or other patient management decisions. A negative result may occur with  improper specimen collection/handling, submission of specimen other than nasopharyngeal swab, presence of viral mutation(s) within the areas targeted by this assay, and inadequate number of viral copies(<138 copies/mL). A negative result must be combined with clinical observations, patient history, and epidemiological information. The expected result is Negative.  Fact Sheet for Patients:  EntrepreneurPulse.com.au  Fact Sheet for Healthcare Providers:  IncredibleEmployment.be  This test is no t yet approved or cleared by the [place] FDA and  has been authorized for detection and/or diagnosis of SARS-CoV-2 by FDA under an Emergency Use Authorization (EUA). This EUA will remain  in effect (meaning this test can be used) for the duration of the COVID-19 declaration under Section 564(b)(1) of the Act, 21 U.S.C.section 360bbb-3(b)(1), unless the authorization is terminated  or revoked sooner.       Influenza A by PCR NEGATIVE NEGATIVE Final   Influenza B by PCR NEGATIVE NEGATIVE Final    Comment: (NOTE) The Xpert Xpress SARS-CoV-2/FLU/RSV plus assay is intended as an aid in the diagnosis of influenza from  Nasopharyngeal swab specimens and should not be used as a sole basis for treatment. Nasal washings and aspirates are unacceptable for  Xpert Xpress SARS-CoV-2/FLU/RSV testing.  Fact Sheet for Patients: EntrepreneurPulse.com.au  Fact Sheet for Healthcare Providers: IncredibleEmployment.be  This test is not yet approved or cleared by the [place] FDA and has been authorized for detection and/or diagnosis of SARS-CoV-2 by FDA under an Emergency Use Authorization (EUA). This EUA will remain in effect (meaning this test can be used) for the duration of the COVID-19 declaration under Section 564(b)(1) of the Act, 21 U.S.C. section 360bbb-3(b)(1), unless the authorization is terminated or revoked.  Performed at [facility], [place] [address] [place],  [address]     RADIOLOGY STUDIES/RESULTS: MR ANGIO HEAD WO CONTRAST  Result Date: 12/19/2020 CLINICAL DATA:  Stroke, follow-up EXAM: MRA HEAD WITHOUT CONTRAST TECHNIQUE: Angiographic images of the Circle of Willis were acquired using MRA technique without intravenous contrast. COMPARISON:  No pertinent prior exam. FINDINGS: Intracranial internal carotid arteries are patent. Middle and anterior cerebral arteries are patent. Intracranial vertebral arteries, basilar artery, posterior cerebral arteries are patent. Bilateral posterior communicating arteries are present. There is no significant stenosis or aneurysm. IMPRESSION: Normal MRA head. Electronically Signed   By: Macy Mis M.D.   On: 12/19/2020 12:32     LOS: 2 days   Oren Binet, MD  [facility]    To contact the attending provider between 7A-7P or the covering provider during after hours 7P-7A, please log into the web site [email or web address] and access using universal [place] password for that web site. If you do not have the password, please call the hospital operator.  12/21/2020, 11:36 AM

## 2020-12-21 NOTE — Plan of Care (Signed)
Pt is  alert oriented x 4. NIH: 0, pt c/o pain to bilataeral heels. Pt has primofit in place for uine output, pt uses walker and +1 assist to ambulate. Pt took medications whole. Pegtube intact, placement checked, and flushed per order. Pt resting no distress noted.   Problem: Education: Goal: Knowledge of General Education information will improve Description: Including pain rating scale, medication(s)/side effects and non-pharmacologic comfort measures Outcome: Progressing   Problem: Health Behavior/Discharge Planning: Goal: Ability to manage health-related needs will improve Outcome: Progressing   Problem: Clinical Measurements: Goal: Ability to maintain clinical measurements within normal limits will improve Outcome: Progressing Goal: Will remain free from infection Outcome: Progressing Goal: Diagnostic test results will improve Outcome: Progressing Goal: Respiratory complications will improve Outcome: Progressing Goal: Cardiovascular complication will be avoided Outcome: Progressing   Problem: Activity: Goal: Risk for activity intolerance will decrease Outcome: Progressing   Problem: Nutrition: Goal: Adequate nutrition will be maintained Outcome: Progressing   Problem: Coping: Goal: Level of anxiety will decrease Outcome: Progressing   Problem: Elimination: Goal: Will not experience complications related to bowel motility Outcome: Progressing Goal: Will not experience complications related to urinary retention Outcome: Progressing   Problem: Pain Managment: Goal: General experience of comfort will improve Outcome: Progressing   Problem: Safety: Goal: Ability to remain free from injury will improve Outcome: Progressing   Problem: Skin Integrity: Goal: Risk for impaired skin integrity will decrease Outcome: Progressing   Problem: Education: Goal: Knowledge of disease or condition will improve Outcome: Progressing Goal: Knowledge of secondary prevention will  improve Outcome: Progressing

## 2020-12-21 NOTE — Progress Notes (Signed)
   Symptomatic right ICA stenosis > 80% Plan NPO past MN Consent ordered for Right CEA 12/22/20   Roxy Horseman PA-C [phone]

## 2020-12-21 NOTE — Progress Notes (Signed)
PT Cancellation Note  Patient Details Name: SABASTIEN TYLER MRN: [id number] DOB: 14-Nov-1932   Cancelled Treatment:    Reason Eval/Treat Not Completed: (P) Fatigue/lethargy limiting ability to participate (per RN, pt having blood transfusion and too drowsy/lethargic for bed-level session.) Will continue efforts per PT POC as schedule permits.  Kara Pacer Aleja Yearwood 12/21/2020, 5:58 PM

## 2020-12-21 NOTE — Progress Notes (Signed)
   VASCULAR SURGERY ASSESSMENT & PLAN:   SYMPTOMATIC RIGHT CAROTID STENOSIS: Patient is scheduled for a right carotid endarterectomy tomorrow.  I have again this morning reviewed the indications for the procedure and the potential complications and he is agreeable to proceed.  He is on aspirin and is on a statin.  CARDIAC: Appreciate Dr. Hassell Done help with preoperative cardiac evaluation.  ANEMIA: His hemoglobin is 7.4.  Given his cardiac history I think he would benefit from preoperative transfusion.  I will leave that to the primary service.  SUBJECTIVE:   No complaints this morning.  PHYSICAL EXAM:   Vitals:   12/20/20 1751 12/20/20 1944 12/20/20 2311 12/21/20 0353  BP: (!) 119/50 (!) 144/62 (!) 145/67 (!) 142/61  Pulse: 81 90 85 89  Resp: 20 18 18 16   Temp: 97.9 F (36.6 C) (!) 97.5 F (36.4 C) 98.6 F (37 C) 98 F (36.7 C)  TempSrc: Oral Oral Oral Oral  SpO2: 99% 100% 100% 98%  Weight:      Height:       No focal weakness this morning.  LABS:   Lab Results  Component Value Date   WBC 6.0 12/21/2020   HGB 7.4 (L) 12/21/2020   HCT 22.5 (L) 12/21/2020   MCV 87.2 12/21/2020   PLT 305 12/21/2020   Lab Results  Component Value Date   CREATININE 1.76 (H) 12/21/2020   Lab Results  Component Value Date   INR 1.2 12/18/2020   CBG (last 3)  Recent Labs    12/20/20 1749 12/20/20 2209 12/21/20 0607  GLUCAP 198* 146* 138*    PROBLEM LIST:    Principal Problem:   Acute ischemic stroke (HCC) Active Problems:   Essential hypertension   CKD (chronic kidney disease), stage IV (HCC)   Dysphagia   Protein-calorie malnutrition, severe   DM (diabetes mellitus), type 2 (HCC)   S/P TURP (status post transurethral resection of prostate)   ABLA (acute blood loss anemia)   Symptomatic stenosis of right carotid artery   CURRENT MEDS:    aspirin EC  325 mg Oral Daily   atorvastatin  40 mg Oral QHS   brinzolamide  1 drop Both Eyes BID   And   brimonidine  1  drop Both Eyes BID   calcium-vitamin D  1 tablet Oral TID with meals   collagenase  1 application Topical QODAY   cyanocobalamin  1,000 mcg Subcutaneous Daily   feeding supplement (PROSource TF)  45 mL Per Tube BID   ferrous sulfate  325 mg Oral BID WC   free water  120 mL Per Tube BID   heparin injection (subcutaneous)  5,000 Units Subcutaneous Q8H   insulin aspart  0-9 Units Subcutaneous TID WC   latanoprost  1 drop Both Eyes QHS   Lifitegrast  1 drop Both Eyes BID   methimazole  10 mg Oral Q breakfast   polyvinyl alcohol  1 drop Both Eyes TID   sucralfate  1 g Oral TID [employer] Office: [phone] 12/21/2020

## 2020-12-21 NOTE — H&P (View-Only) (Signed)
   VASCULAR SURGERY ASSESSMENT & PLAN:   SYMPTOMATIC RIGHT CAROTID STENOSIS: Patient is scheduled for a right carotid endarterectomy tomorrow.  I have again this morning reviewed the indications for the procedure and the potential complications and he is agreeable to proceed.  He is on aspirin and is on a statin.  CARDIAC: Appreciate Dr. Hassell Done help with preoperative cardiac evaluation.  ANEMIA: His hemoglobin is 7.4.  Given his cardiac history I think he would benefit from preoperative transfusion.  I will leave that to the primary service.  SUBJECTIVE:   No complaints this morning.  PHYSICAL EXAM:   Vitals:   12/20/20 1751 12/20/20 1944 12/20/20 2311 12/21/20 0353  BP: (!) 119/50 (!) 144/62 (!) 145/67 (!) 142/61  Pulse: 81 90 85 89  Resp: 20 18 18 16   Temp: 97.9 F (36.6 C) (!) 97.5 F (36.4 C) 98.6 F (37 C) 98 F (36.7 C)  TempSrc: Oral Oral Oral Oral  SpO2: 99% 100% 100% 98%  Weight:      Height:       No focal weakness this morning.  LABS:   Lab Results  Component Value Date   WBC 6.0 12/21/2020   HGB 7.4 (L) 12/21/2020   HCT 22.5 (L) 12/21/2020   MCV 87.2 12/21/2020   PLT 305 12/21/2020   Lab Results  Component Value Date   CREATININE 1.76 (H) 12/21/2020   Lab Results  Component Value Date   INR 1.2 12/18/2020   CBG (last 3)  Recent Labs    12/20/20 1749 12/20/20 2209 12/21/20 0607  GLUCAP 198* 146* 138*    PROBLEM LIST:    Principal Problem:   Acute ischemic stroke (HCC) Active Problems:   Essential hypertension   CKD (chronic kidney disease), stage IV (HCC)   Dysphagia   Protein-calorie malnutrition, severe   DM (diabetes mellitus), type 2 (HCC)   S/P TURP (status post transurethral resection of prostate)   ABLA (acute blood loss anemia)   Symptomatic stenosis of right carotid artery   CURRENT MEDS:    aspirin EC  325 mg Oral Daily   atorvastatin  40 mg Oral QHS   brinzolamide  1 drop Both Eyes BID   And   brimonidine  1  drop Both Eyes BID   calcium-vitamin D  1 tablet Oral TID with meals   collagenase  1 application Topical QODAY   cyanocobalamin  1,000 mcg Subcutaneous Daily   feeding supplement (PROSource TF)  45 mL Per Tube BID   ferrous sulfate  325 mg Oral BID WC   free water  120 mL Per Tube BID   heparin injection (subcutaneous)  5,000 Units Subcutaneous Q8H   insulin aspart  0-9 Units Subcutaneous TID WC   latanoprost  1 drop Both Eyes QHS   Lifitegrast  1 drop Both Eyes BID   methimazole  10 mg Oral Q breakfast   polyvinyl alcohol  1 drop Both Eyes TID   sucralfate  1 g Oral TID [employer] Office: [phone] 12/21/2020

## 2020-12-21 NOTE — Anesthesia Preprocedure Evaluation (Addendum)
Anesthesia Evaluation  Patient identified by MRN, date of birth, ID band Patient awake    Reviewed: Allergy & Precautions, NPO status , Patient's Chart, lab work & pertinent test results  History of Anesthesia Complications Negative for: history of anesthetic complications  Airway Mallampati: II  TM Distance: >3 FB Neck ROM: Full    Dental  (+) Poor Dentition, Dental Advisory Given   Pulmonary neg pulmonary ROS, former smoker,    Pulmonary exam normal        Cardiovascular hypertension, + CAD and + Peripheral Vascular Disease  + Valvular Problems/Murmurs (mild/mod) AS  Rhythm:Regular Rate:Normal + Systolic murmurs IMPRESSIONS   1. Left ventricular ejection fraction, by estimation, is 55 to 60%. The left ventricle has normal function. The left ventricle demonstrates regional wall motion abnormalities (see scoring diagram/findings for description). Left ventricular diastolic  parameters were normal. There is hypokinesis of the left ventricular, apical inferior wall. 2. Right ventricular systolic function is normal. The right ventricular size is normal. Tricuspid regurgitation signal is inadequate for assessing PA pressure. 3. The mitral valve is normal in structure. Trivial mitral valve regurgitation. No evidence of mitral stenosis. 4. The aortic valve is calcified. There is severe calcifcation of the aortic valve. There is severe thickening of the aortic valve. Aortic valve regurgitation is trivial. Mild to moderate aortic valve stenosis. Aortic valve area, by VTI measures 1.93  cm. Aortic valve mean gradient measures 16.0 mmHg. Aortic valve Vmax measures 2.49 m/s. 5. Aortic dilatation noted. There is mild dilatation of the aortic root, measuring 42 mm. There is mild dilatation of the ascending aorta, measuring 41 mm.    Neuro/Psych  Headaches, TIAnegative psych ROS   GI/Hepatic   Endo/Other  diabetes, Type  2Hypothyroidism   Renal/GU CRFRenal disease Bladder dysfunction  BPH    Musculoskeletal  (+) Arthritis ,   Abdominal   Peds  Hematology  (+) anemia ,   Anesthesia Other Findings   Reproductive/Obstetrics                            Anesthesia Physical  Anesthesia Plan  ASA: 3  Anesthesia Plan: General   Post-op Pain Management:    Induction: Intravenous  PONV Risk Score and Plan: 2 and Ondansetron, Treatment may vary due to age or medical condition and Dexamethasone  Airway Management Planned: Oral ETT  Additional Equipment: None and Arterial line  Intra-op Plan:   Post-operative Plan: Extubation in OR  Informed Consent: I have reviewed the patients History and Physical, chart, labs and discussed the procedure including the risks, benefits and alternatives for the proposed anesthesia with the patient or authorized representative who has indicated his/her understanding and acceptance.     Dental advisory given  Plan Discussed with: CRNA and Anesthesiologist  Anesthesia Plan Comments: (See PAT note 11/21/2020, Konrad Felix Ward, PA-C Pt followed by vascular surgery for right heel ulcer due to PVD and right carotid stenosis.  Last seen 09/27/2020. Per OV note, "On exam this patient had a left carotid bruit. For this reason we obtained a carotid duplex scan which showsa 60 to 79% right carotid stenosis with no significant stenosis on the left. Interestingly, the bruit was on the opposite side and I rechecked on exam. Regardless, the patient has an asymptomatic 60 to 79% right carotid stenosis. We would not consider right carotid endarterectomy unless the patient develops new right hemispheric symptoms of the stenosis progressed to greater than 80%. I have ordered  a follow-up carotid duplex scan in 6 months. He is on a statin. He refuses to take aspirin."  Pt seen by cardiology 06/22/2020. Per OV note, "He is at low risk of major cardiac  complications with the proposed low risk procedure.  He is not taking anticoagulation. He is not taking aspirin regularly and if he is, can stop five days prior."  Lab Results      Component                Value               Date                      WBC                      6.4                 11/21/2020                HGB                      10.0 (L)            11/21/2020                HCT                      32.0 (L)            11/21/2020                MCV                      88.2                11/21/2020                PLT                      264                 11/21/2020           Lab Results      Component                Value               Date                      NA                       136                 11/21/2020                K                        5.4 (H)             11/21/2020                CO2                      23                  11/21/2020  GLUCOSE                  237 (H)             11/21/2020                BUN                      46 (H)              11/21/2020                CREATININE               2.11 (H)            11/21/2020                CALCIUM                  8.8 (L)             11/21/2020                GFRNONAA                 30 (L)              11/21/2020                GFRAA                    40 (L)              04/23/2015            Echo 03/08/2020 1. Left ventricular ejection fraction, by estimation, is 55 to 60%. The  left ventricle has normal function. The left ventricle has no regional  wall motion abnormalities. There is moderate left ventricular hypertrophy.  Left ventricular diastolic  parameters are indeterminate.  2. Right ventricular systolic function is normal. The right ventricular  size is normal. Tricuspid regurgitation signal is inadequate for assessing  PA pressure.  3. The mitral valve is normal in structure. No evidence of mitral valve  regurgitation.  4. Aortic dilatation noted. There is  mild dilatation of the aortic root,  measuring 40 mm.  5. The inferior vena cava is normal in size with greater than 50%  respiratory variability, suggesting right atrial pressure of 3 mmHg.  6. The aortic valve is calcified. There is moderate calcification of the  aortic valve. Aortic valve regurgitation is not visualized. Mild to  moderate aortic valve stenosis. Vmax 2.2 m/s, MG 12 mmHg, AVA 1.5 cm^2, DI  0.33 )       Anesthesia Quick Evaluation

## 2020-12-21 NOTE — Plan of Care (Signed)

## 2020-12-22 ENCOUNTER — Inpatient Hospital Stay (HOSPITAL_COMMUNITY): Payer: No Typology Code available for payment source | Admitting: Certified Registered"

## 2020-12-22 ENCOUNTER — Encounter (HOSPITAL_COMMUNITY)
Admission: EM | Disposition: A | Discharge status: Home Health | Payer: Self-pay | Source: Home / Self Care | Attending: Internal Medicine

## 2020-12-22 DIAGNOSIS — I6521 Occlusion and stenosis of right carotid artery: Secondary | ICD-10-CM

## 2020-12-22 DIAGNOSIS — D62 Acute posthemorrhagic anemia: Secondary | ICD-10-CM | POA: Diagnosis not present

## 2020-12-22 DIAGNOSIS — D649 Anemia, unspecified: Secondary | ICD-10-CM | POA: Diagnosis not present

## 2020-12-22 DIAGNOSIS — N390 Urinary tract infection, site not specified: Secondary | ICD-10-CM

## 2020-12-22 DIAGNOSIS — I639 Cerebral infarction, unspecified: Secondary | ICD-10-CM | POA: Diagnosis not present

## 2020-12-22 DIAGNOSIS — R319 Hematuria, unspecified: Secondary | ICD-10-CM

## 2020-12-22 HISTORY — PX: ENDARTERECTOMY: SHX5162

## 2020-12-22 LAB — CBC
HCT: 27.6 % — ABNORMAL LOW (ref 39.0–52.0)
Hemoglobin: 8.9 g/dL — ABNORMAL LOW (ref 13.0–17.0)
MCH: 28.4 pg (ref 26.0–34.0)
MCHC: 32.2 g/dL (ref 30.0–36.0)
MCV: 88.2 fL (ref 80.0–100.0)
Platelets: 328 10*3/uL (ref 150–400)
RBC: 3.13 MIL/uL — ABNORMAL LOW (ref 4.22–5.81)
RDW: 13.9 % (ref 11.5–15.5)
WBC: 5.8 10*3/uL (ref 4.0–10.5)
nRBC: 0 % (ref 0.0–0.2)

## 2020-12-22 LAB — BASIC METABOLIC PANEL
Anion gap: 7 (ref 5–15)
BUN: 36 mg/dL — ABNORMAL HIGH (ref 8–23)
CO2: 23 mmol/L (ref 22–32)
Calcium: 9.1 mg/dL (ref 8.9–10.3)
Chloride: 107 mmol/L (ref 98–111)
Creatinine, Ser: 1.88 mg/dL — ABNORMAL HIGH (ref 0.61–1.24)
GFR, Estimated: 34 mL/min — ABNORMAL LOW (ref >60)
Glucose, Bld: 101 mg/dL — ABNORMAL HIGH (ref 70–99)
Potassium: 4.4 mmol/L (ref 3.5–5.1)
Sodium: 137 mmol/L (ref 135–145)

## 2020-12-22 LAB — TYPE AND SCREEN
ABO/RH(D): A POS
Antibody Screen: NEGATIVE
Unit division: 0

## 2020-12-22 LAB — GLUCOSE, CAPILLARY
Glucose-Capillary: 128 mg/dL — ABNORMAL HIGH (ref 70–99)
Glucose-Capillary: 120 mg/dL — ABNORMAL HIGH (ref 70–99)
Glucose-Capillary: 107 mg/dL — ABNORMAL HIGH (ref 70–99)

## 2020-12-22 LAB — BPAM RBC
Blood Product Expiration Date: 202210252359
ISSUE DATE / TIME: 202209221640
Unit Type and Rh: 6200

## 2020-12-22 LAB — POCT ACTIVATED CLOTTING TIME
Activated Clotting Time: 242 seconds
Activated Clotting Time: 231 seconds

## 2020-12-22 SURGERY — ENDARTERECTOMY, CAROTID
Anesthesia: General | Laterality: Right

## 2020-12-22 MED ORDER — FENTANYL CITRATE (PF) 100 MCG/2ML IJ SOLN
25.0000 ug | INTRAMUSCULAR | Status: DC | PRN
  Administered 2020-12-22 (×2): 25 ug via INTRAVENOUS

## 2020-12-22 MED ORDER — PROPOFOL 10 MG/ML IV BOLUS
INTRAVENOUS | Status: AC
  Filled 2020-12-22: qty 20

## 2020-12-22 MED ORDER — PROTAMINE SULFATE 10 MG/ML IV SOLN
INTRAVENOUS | Status: DC | PRN
  Administered 2020-12-22: 40 mg via INTRAVENOUS

## 2020-12-22 MED ORDER — FENTANYL CITRATE (PF) 250 MCG/5ML IJ SOLN
INTRAMUSCULAR | Status: AC
  Filled 2020-12-22: qty 5

## 2020-12-22 MED ORDER — ROCURONIUM BROMIDE 10 MG/ML (PF) SYRINGE
PREFILLED_SYRINGE | INTRAVENOUS | Status: DC | PRN
  Administered 2020-12-22: 50 mg via INTRAVENOUS
  Administered 2020-12-22: 20 mg via INTRAVENOUS

## 2020-12-22 MED ORDER — DEXAMETHASONE SODIUM PHOSPHATE 10 MG/ML IJ SOLN
INTRAMUSCULAR | Status: AC
  Filled 2020-12-22: qty 1

## 2020-12-22 MED ORDER — 0.9 % SODIUM CHLORIDE (POUR BTL) OPTIME
TOPICAL | Status: DC | PRN
  Administered 2020-12-22: 2000 mL

## 2020-12-22 MED ORDER — ONDANSETRON HCL 4 MG/2ML IJ SOLN
INTRAMUSCULAR | Status: DC | PRN
  Administered 2020-12-22: 4 mg via INTRAVENOUS

## 2020-12-22 MED ORDER — SUCCINYLCHOLINE CHLORIDE 200 MG/10ML IV SOSY
PREFILLED_SYRINGE | INTRAVENOUS | Status: DC | PRN
  Administered 2020-12-22: 100 mg via INTRAVENOUS

## 2020-12-22 MED ORDER — ALUM & MAG HYDROXIDE-SIMETH 200-200-20 MG/5ML PO SUSP: 15.0000 mL | ORAL | Status: DC | PRN

## 2020-12-22 MED ORDER — LACTATED RINGERS IV SOLN: INTRAVENOUS | Status: DC | PRN

## 2020-12-22 MED ORDER — DOCUSATE SODIUM 100 MG PO CAPS
100.0000 mg | ORAL_CAPSULE | Freq: Every day | ORAL | Status: DC
  Administered 2020-12-23 – 2020-12-26 (×4): 100 mg via ORAL
  Filled 2020-12-22 (×4): qty 1

## 2020-12-22 MED ORDER — CEFAZOLIN SODIUM-DEXTROSE 2-4 GM/100ML-% IV SOLN
2.0000 g | Freq: Once | INTRAVENOUS | Status: AC
  Administered 2020-12-22: 2 g via INTRAVENOUS
  Filled 2020-12-22: qty 100

## 2020-12-22 MED ORDER — HEPARIN SODIUM (PORCINE) 5000 UNIT/ML IJ SOLN
5000.0000 [IU] | Freq: Three times a day (TID) | INTRAMUSCULAR | Status: DC
  Administered 2020-12-23 – 2020-12-26 (×10): 5000 [IU] via SUBCUTANEOUS
  Filled 2020-12-22 (×9): qty 1

## 2020-12-22 MED ORDER — SODIUM CHLORIDE 0.9 % IV SOLN: INTRAVENOUS | Status: AC

## 2020-12-22 MED ORDER — HEPARIN SODIUM (PORCINE) 1000 UNIT/ML IJ SOLN
INTRAMUSCULAR | Status: DC | PRN
  Administered 2020-12-22: 1000 [IU] via INTRAVENOUS
  Administered 2020-12-22: 6500 [IU] via INTRAVENOUS

## 2020-12-22 MED ORDER — HYDRALAZINE HCL 20 MG/ML IJ SOLN: 5.0000 mg | INTRAMUSCULAR | Status: DC | PRN

## 2020-12-22 MED ORDER — LIDOCAINE-EPINEPHRINE (PF) 1 %-1:200000 IJ SOLN
INTRAMUSCULAR | Status: AC
  Filled 2020-12-22: qty 30

## 2020-12-22 MED ORDER — MORPHINE SULFATE (PF) 2 MG/ML IV SOLN: 2.0000 mg | INTRAVENOUS | Status: DC | PRN

## 2020-12-22 MED ORDER — HYDROCODONE-ACETAMINOPHEN 5-325 MG PO TABS
1.0000 | ORAL_TABLET | ORAL | Status: DC | PRN
  Administered 2020-12-22: 1 via ORAL
  Administered 2020-12-23 – 2020-12-24 (×2): 2 via ORAL
  Filled 2020-12-22: qty 2
  Filled 2020-12-22: qty 1
  Filled 2020-12-22: qty 2

## 2020-12-22 MED ORDER — PROMETHAZINE HCL 25 MG/ML IJ SOLN: 6.2500 mg | INTRAMUSCULAR | Status: DC | PRN

## 2020-12-22 MED ORDER — SODIUM CHLORIDE 0.9 % IV SOLN
INTRAVENOUS | Status: DC | PRN
  Administered 2020-12-22: 30 ug/min via INTRAVENOUS

## 2020-12-22 MED ORDER — LABETALOL HCL 5 MG/ML IV SOLN
10.0000 mg | INTRAVENOUS | Status: DC | PRN
  Administered 2020-12-22: 10 mg via INTRAVENOUS
  Filled 2020-12-22: qty 4

## 2020-12-22 MED ORDER — SUGAMMADEX SODIUM 200 MG/2ML IV SOLN
INTRAVENOUS | Status: DC | PRN
  Administered 2020-12-22: 200 mg via INTRAVENOUS

## 2020-12-22 MED ORDER — LIDOCAINE HCL (PF) 1 % IJ SOLN
INTRAMUSCULAR | Status: AC
  Filled 2020-12-22: qty 5

## 2020-12-22 MED ORDER — FENTANYL CITRATE (PF) 250 MCG/5ML IJ SOLN
INTRAMUSCULAR | Status: DC | PRN
  Administered 2020-12-22: 50 ug via INTRAVENOUS

## 2020-12-22 MED ORDER — LIDOCAINE HCL (PF) 2 % IJ SOLN
INTRAMUSCULAR | Status: AC
  Filled 2020-12-22: qty 5

## 2020-12-22 MED ORDER — PROPOFOL 10 MG/ML IV BOLUS
INTRAVENOUS | Status: DC | PRN
Start: 2020-12-22 — End: 2020-12-22
  Administered 2020-12-22: 100 mg via INTRAVENOUS

## 2020-12-22 MED ORDER — DEXTRAN 40 IN SALINE 10-0.9 % IV SOLN
INTRAVENOUS | Status: AC | PRN
  Administered 2020-12-22: 500 mL

## 2020-12-22 MED ORDER — SODIUM CHLORIDE 0.9 % IV SOLN
500.0000 mL | Freq: Once | INTRAVENOUS | Status: DC | PRN
Start: 2020-12-22 — End: 2020-12-26

## 2020-12-22 MED ORDER — FENTANYL CITRATE (PF) 100 MCG/2ML IJ SOLN
INTRAMUSCULAR | Status: AC
  Filled 2020-12-22: qty 2

## 2020-12-22 MED ORDER — ONDANSETRON HCL 4 MG/2ML IJ SOLN
INTRAMUSCULAR | Status: AC
  Filled 2020-12-22: qty 2

## 2020-12-22 MED ORDER — ROCURONIUM BROMIDE 10 MG/ML (PF) SYRINGE
PREFILLED_SYRINGE | INTRAVENOUS | Status: AC
  Filled 2020-12-22: qty 10

## 2020-12-22 MED ORDER — METOPROLOL TARTRATE 5 MG/5ML IV SOLN: 2.0000 mg | INTRAVENOUS | Status: DC | PRN

## 2020-12-22 MED ORDER — LIDOCAINE-EPINEPHRINE (PF) 1 %-1:200000 IJ SOLN
INTRAMUSCULAR | Status: DC | PRN
  Administered 2020-12-22 (×2): 10 mL

## 2020-12-22 MED ORDER — HEPARIN 6000 UNIT IRRIGATION SOLUTION
Status: DC | PRN
  Administered 2020-12-22: 1

## 2020-12-22 MED ORDER — PANTOPRAZOLE SODIUM 40 MG PO TBEC
40.0000 mg | DELAYED_RELEASE_TABLET | Freq: Every day | ORAL | Status: DC
  Administered 2020-12-23 – 2020-12-26 (×4): 40 mg via ORAL
  Filled 2020-12-22 (×4): qty 1

## 2020-12-22 MED ORDER — ONDANSETRON HCL 4 MG/2ML IJ SOLN: 4.0000 mg | Freq: Four times a day (QID) | INTRAMUSCULAR | Status: DC | PRN

## 2020-12-22 MED ORDER — ASPIRIN EC 81 MG PO TBEC
81.0000 mg | DELAYED_RELEASE_TABLET | Freq: Every day | ORAL | Status: DC
  Administered 2020-12-23 – 2020-12-26 (×4): 81 mg via ORAL
  Filled 2020-12-22 (×4): qty 1

## 2020-12-22 MED ORDER — PHENOL 1.4 % MT LIQD: 1.0000 | OROMUCOSAL | Status: DC | PRN

## 2020-12-22 MED ORDER — HEPARIN 6000 UNIT IRRIGATION SOLUTION
Status: AC
  Filled 2020-12-22: qty 500

## 2020-12-22 MED ORDER — LIDOCAINE 2% (20 MG/ML) 5 ML SYRINGE
INTRAMUSCULAR | Status: DC | PRN
  Administered 2020-12-22: 100 mg via INTRAVENOUS

## 2020-12-22 SURGICAL SUPPLY — 44 items
ADH SKN CLS APL DERMABOND .7 (GAUZE/BANDAGES/DRESSINGS) ×1
BAG COUNTER SPONGE SURGICOUNT (BAG) ×1 IMPLANT
BAG DECANTER FOR FLEXI CONT (MISCELLANEOUS) ×2 IMPLANT
BAG SPNG CNTER NS LX DISP (BAG)
CANISTER SUCT 3000ML PPV (MISCELLANEOUS) ×2 IMPLANT
CANNULA VESSEL 3MM 2 BLNT TIP (CANNULA) ×6 IMPLANT
CATH ROBINSON RED A/P 18FR (CATHETERS) ×2 IMPLANT
CLIP VESOCCLUDE MED 24/CT (CLIP) ×2 IMPLANT
CLIP VESOCCLUDE SM WIDE 24/CT (CLIP) ×2 IMPLANT
DERMABOND ADVANCED (GAUZE/BANDAGES/DRESSINGS) ×1
DERMABOND ADVANCED .7 DNX12 (GAUZE/BANDAGES/DRESSINGS) ×1 IMPLANT
DRAIN CHANNEL 15F RND FF W/TCR (WOUND CARE) IMPLANT
ELECT REM PT RETURN 9FT ADLT (ELECTROSURGICAL) ×2
ELECTRODE REM PT RTRN 9FT ADLT (ELECTROSURGICAL) ×1 IMPLANT
EVACUATOR SILICONE 100CC (DRAIN) IMPLANT
GLOVE SRG 8 PF TXTR STRL LF DI (GLOVE) ×1 IMPLANT
GLOVE SURG ENC MOIS LTX SZ7.5 (GLOVE) ×2 IMPLANT
GLOVE SURG UNDER POLY LF SZ8 (GLOVE) ×2
GOWN STRL REUS W/ TWL LRG LVL3 (GOWN DISPOSABLE) ×3 IMPLANT
GOWN STRL REUS W/TWL LRG LVL3 (GOWN DISPOSABLE) ×6
KIT BASIN OR (CUSTOM PROCEDURE TRAY) ×2 IMPLANT
KIT SHUNT ARGYLE CAROTID ART 6 (VASCULAR PRODUCTS) IMPLANT
KIT TURNOVER KIT B (KITS) ×2 IMPLANT
NDL HYPO 25GX1X1/2 BEV (NEEDLE) ×1 IMPLANT
NEEDLE HYPO 25GX1X1/2 BEV (NEEDLE) ×4 IMPLANT
NS IRRIG 1000ML POUR BTL (IV SOLUTION) ×4 IMPLANT
PACK CAROTID (CUSTOM PROCEDURE TRAY) ×2 IMPLANT
PAD ARMBOARD 7.5X6 YLW CONV (MISCELLANEOUS) ×4 IMPLANT
POSITIONER HEAD DONUT 9IN (MISCELLANEOUS) ×2 IMPLANT
SET WALTER ACTIVATION W/DRAPE (SET/KITS/TRAYS/PACK) ×1 IMPLANT
SHUNT CAROTID BYPASS 10 (VASCULAR PRODUCTS) IMPLANT
SHUNT CAROTID BYPASS 12 (VASCULAR PRODUCTS) IMPLANT
SHUNT CAROTID BYPASS 12FRX15.5 (VASCULAR PRODUCTS) ×1 IMPLANT
SPONGE SURGIFOAM ABS GEL 100 (HEMOSTASIS) IMPLANT
SUT MNCRL AB 4-0 PS2 18 (SUTURE) ×2 IMPLANT
SUT PROLENE 6 0 BV (SUTURE) ×6 IMPLANT
SUT PROLENE 6 0 C 1 24 (SUTURE) ×1 IMPLANT
SUT SILK 2 0 PERMA HAND 18 BK (SUTURE) IMPLANT
SUT VIC AB 3-0 SH 27 (SUTURE) ×2
SUT VIC AB 3-0 SH 27X BRD (SUTURE) ×1 IMPLANT
SYR 20ML LL LF (SYRINGE) ×2 IMPLANT
SYR CONTROL 10ML LL (SYRINGE) ×3 IMPLANT
TOWEL GREEN STERILE (TOWEL DISPOSABLE) ×2 IMPLANT
WATER STERILE IRR 1000ML POUR (IV SOLUTION) ×2 IMPLANT

## 2020-12-22 NOTE — Progress Notes (Signed)
PROGRESS NOTE        PATIENT DETAILS Name: John Walton Age: 87 y.o. Sex: male Date of Birth: 1932/08/28 Admit Date: 12/18/2020 Admitting Physician Etta Quill, DO VOZ:DGUYQIH, Fransico Him, MD  Brief Narrative: Patient is a 87 y.o. male with history of known right carotid artery stenosis, HTN, HLD, DM-2, CKD stage IV-recent TURP on 9/15-presenting with left eye blurry vision, left hand numbness/weakness and dysarthria.  She was found to have acute CVA and subsequently admitted to the hospitalist service.  See below for further details.    Subjective: Seen in PACU-lying comfortably in bed-hard of hearing-asking for a "pain pill"  Objective: Vitals: Blood pressure (!) 151/62, pulse 76, temperature 97.7 F (36.5 C), resp. rate 16, height 5\' 6"  (1.676 m), weight 65 kg, SpO2 99 %.   Exam: Gen Exam:Alert awake-not in any distress HEENT:atraumatic, normocephalic Chest: B/L clear to auscultation anteriorly CVS:S1S2 regular Abdomen:soft non tender, non distended Extremities:no edema Neurology: moving all 4 ext Skin: no rash   Pertinent Labs/Radiology: WBC: 5.8 Hb: 8.9 Na:137 K: 4.4 Creatinine: 1.88  Microbiology: 9/19>>Urine Culture: No growth  Stroke work up: 9/19>> CT head: No acute intracranial abnormality. 9/19>> MRI brain: Patchy multifocal acute infarcts involving right frontal/parietal and occipital lobes. 9/20>> A1c: 6.5 9/20>> LDL: 58 9/20>> carotid Doppler: 80-99% stenosis on right, 1-39% on left.   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  Dysarthria/Right sided mild weakness has resolved. No further recommendations from neurology-patient remains on aspirin and statin. Underwent Right Carotid endarterectomy on 9/23.    Symptomatic right carotid artery stenosis: Vascular  surgery following-patient is s/p carotid endarterectomy on 9/23.  Given the fact that he had wall motion abnormalities on echo-cardiology consulted-okay to proceed with surgery.  Acute blood loss anemia (due to hematuria/recent TURP) superimposed on anemia due to chronic disease/renal failure: s/p 2 units of PRBC transfusion-Hb now stable. Have placed on weekly aranesp. Follow Hb  BPH-s/p TURP: Did have hematuria postoperatively-that per patient/spouse has improved markedly over the past few days-only a pink tinge to his urine.  Spoke with patient's primary urologist-Dr. [name] on 9/20-okay to place patient on antiplatelets-okay to proceed with endarterectomy-and okay for intraoperative heparin.  If significant hematuria reoccurs-recommends we place a Foley catheter.  ?  UTI versus asymptomatic bacteriuria: Recent TURP-UA with significant amount of RBCs/WBCs-no symptoms of UTI-given 1 dose of IV Rocephin-cultures are negative-continue to monitor of antimicrobial therapy.  AKI on CKD stage IV: AKI likely hemodynamically mediated in the setting of hematuria/recent surgery-creatinine improving and close to baseline.  Avoid nephrotoxic agents-follow closely.    DM-2: Diet controlled at home-continue SSI-CBG stable.  Recent Labs    12/21/20 1243 12/21/20 1654 12/22/20 1000  GLUCAP 211* 160* 128*     HTN: BP stable-allow permissive hypertension.  HLD: On statin  Hyperthyroidism: On methimazole-TSH stable.  History of transient dysphagia due to severe esophageal spasm in the setting of allergy to Robitussin-PEG tube remains in place: Although patient able to consume orally without any issues.  Per spouse-PEG tube is used for occasional nutritional supplements  Procedures: None Consults: Neurology, vascular surgery, cardiology DVT Prophylaxis: SQ Heparin Code Status:Full code Family Communication: Spouse at bedside on 9/20-no one at bedside today.  Time spent: 25 minutes-Greater than  50%  of this time was spent in counseling, explanation of diagnosis, planning of further management, and coordination of care.  Diet: Diet Order     None         Disposition Plan: Status is: Inpatient  Remains inpatient appropriate because:Inpatient level of care appropriate due to severity of illness  Dispo: The patient is from: Home              Anticipated d/c is to: Home              Patient currently is not medically stable to d/c.   Difficult to place patient No  Barriers to Discharge: Acute CVA due to right carotid artery stenosis-severe anemia due to blood loss-needs inpatient optimization/neuro/vascular surgery evaluation before consideration of discharge.  Antimicrobial agents: Anti-infectives (From admission, onward)    Start     Dose/Rate Route Frequency Ordered Stop   12/22/20 0800  [MAR Hold]  cefUROXime (ZINACEF) 1.5 g in sodium chloride 0.9 % 100 mL IVPB        (MAR Hold since Fri 12/22/2020 at 0646.Hold Reason: Transfer to a Procedural area)   1.5 g 200 mL/hr over 30 Minutes Intravenous On call to O.R. 12/21/20 0644 12/23/20 0559   12/22/20 0700  [MAR Hold]  ceFAZolin (ANCEF) IVPB 2g/100 mL premix        (MAR Hold since Fri 12/22/2020 at 0646.Hold Reason: Transfer to a Procedural area)  Note to Pharmacy: Send with pt to OR   2 g 200 mL/hr over 30 Minutes Intravenous On call 12/21/20 0752 12/22/20 0738   12/18/20 2345  cefTRIAXone (ROCEPHIN) 1 g in sodium chloride 0.9 % 100 mL IVPB        1 g 200 mL/hr over 30 Minutes Intravenous  Once 12/18/20 2343 12/19/20 0120        MEDICATIONS: Scheduled Meds:  [MAR Hold] acetaminophen  650 mg Oral Once   [MAR Hold] aspirin EC  325 mg Oral Daily   [MAR Hold] atorvastatin  40 mg Oral QHS   [MAR Hold] brinzolamide  1 drop Both Eyes BID   And   [MAR Hold] brimonidine  1 drop Both Eyes BID   [MAR Hold] calcium-vitamin D  1 tablet Oral TID with meals   [MAR Hold] collagenase  1 application Topical QODAY   [MAR Hold]  cyanocobalamin  1,000 mcg Subcutaneous Daily   [MAR Hold] feeding supplement (PROSource TF)  45 mL Per Tube BID   [MAR Hold] ferrous sulfate  325 mg Oral BID WC   [MAR Hold] free water  120 mL Per Tube BID   [MAR Hold] heparin injection (subcutaneous)  5,000 Units Subcutaneous Q8H   [MAR Hold] insulin aspart  0-9 Units Subcutaneous TID WC   [MAR Hold] latanoprost  1 drop Both Eyes QHS   [MAR Hold] Lifitegrast  1 drop Both Eyes BID   [MAR Hold] methimazole  10 mg Oral Q breakfast   [MAR Hold] polyvinyl alcohol  1 drop Both Eyes TID   [MAR Hold] sucralfate  1 g Oral TID AC   Continuous Infusions:  [MAR Hold] cefUROXime (ZINACEF)  IV     PRN Meds:.[MAR Hold] acetaminophen **OR** [MAR Hold] acetaminophen (TYLENOL) oral liquid 160 mg/5 mL **OR** [MAR Hold] acetaminophen, fentaNYL (SUBLIMAZE) injection, [MAR Hold] HYDROcodone-acetaminophen, promethazine   I have personally reviewed following labs and imaging studies  LABORATORY DATA: CBC: Recent Labs  Lab 12/18/20 1944 12/18/20 1952 12/19/20 0742 12/20/20 0332 12/21/20 0322 12/22/20 0322  WBC 11.4*  --  6.9 5.0 6.0 5.8  NEUTROABS 9.1*  --   --   --   --   --   HGB 7.2* 7.1* 7.6* 7.5* 7.4* 8.9*  HCT 23.4* 21.0* 24.2* 22.6* 22.5* 27.6*  MCV 90.3  --  90.6 86.6 87.2 88.2  PLT 380  --  277 268 305 328     Basic Metabolic Panel: Recent Labs  Lab 12/18/20 1944 12/18/20 1952 12/19/20 0742 12/20/20 0332 12/21/20 0322 12/22/20 0322  NA 136 138 138 138 137 137  K 4.9 4.6 4.1 4.2 4.2 4.4  CL 108 109 111 109 108 107  CO2 21*  --  19* 20* 21* 23  GLUCOSE 160* 159* 89 115* 141* 101*  BUN 43* 42* 42* 38* 40* 36*  CREATININE 2.45* 2.50* 2.29* 1.92* 1.76* 1.88*  CALCIUM 8.4*  --  8.1* 8.1* 8.5* 9.1  PHOS  --   --   --  4.3  --   --      GFR: Estimated Creatinine Clearance: 25.5 mL/min (A) (by C-G formula based on SCr of 1.88 mg/dL (H)).  Liver Function Tests: Recent Labs  Lab 12/18/20 1944 12/20/20 0332  AST 13*  --    ALT 8  --   ALKPHOS 83  --   BILITOT 0.1*  --   PROT 6.9  --   ALBUMIN 3.0* 2.2*    No results for input(s): LIPASE, AMYLASE in the last 168 hours. No results for input(s): AMMONIA in the last 168 hours.  Coagulation Profile: Recent Labs  Lab 12/18/20 1944  INR 1.2     Cardiac Enzymes: No results for input(s): CKTOTAL, CKMB, CKMBINDEX, TROPONINI in the last 168 hours.  BNP (last 3 results) No results for input(s): PROBNP in the last 8760 hours.  Lipid Profile: No results for input(s): CHOL, HDL, LDLCALC, TRIG, CHOLHDL, LDLDIRECT in the last 72 hours.   Thyroid Function Tests: No results for input(s): TSH, T4TOTAL, FREET4, T3FREE, THYROIDAB in the last 72 hours.   Anemia Panel: Recent Labs    12/20/20 0332  VITAMINB12 260  FOLATE 10.2  FERRITIN 120  TIBC 167*  IRON 17*  RETICCTPCT 2.0     Urine analysis:    Component Value Date/Time   COLORURINE YELLOW 12/18/2020 1934   APPEARANCEUR TURBID (A) 12/18/2020 1934   LABSPEC 1.025 12/18/2020 1934   PHURINE 5.5 12/18/2020 1934   GLUCOSEU NEGATIVE 12/18/2020 1934   GLUCOSEU 500 (A) 07/11/2014 0850   HGBUR LARGE (A) 12/18/2020 1934   BILIRUBINUR NEGATIVE 12/18/2020 1934   KETONESUR NEGATIVE 12/18/2020 1934   PROTEINUR >300 (A) 12/18/2020 1934   UROBILINOGEN 0.2 07/11/2014 0850   NITRITE NEGATIVE 12/18/2020 1934   LEUKOCYTESUR SMALL (A) 12/18/2020 1934    Sepsis Labs: Lactic Acid, Venous    Component Value Date/Time   LATICACIDVEN 1.9 07/16/2020 1601    MICROBIOLOGY: Recent Results (from the past 240 hour(s))  Surgical PCR screen     Status: None   Collection Time: 12/14/20 10:53 PM   Specimen: Nasal Mucosa; Nasal Swab  Result Value Ref Range Status   MRSA, PCR NEGATIVE NEGATIVE Final   Staphylococcus aureus NEGATIVE NEGATIVE Final    Comment: (NOTE) The Xpert SA Assay (FDA approved for NASAL specimens in patients 42 years of age and older), is one component of a comprehensive surveillance  program. It is not intended to diagnose infection nor to guide or monitor treatment. Performed at [facility], [place] [address], [place], [place] [address]  Urine Culture     Status: None   Collection Time: 12/18/20  7:34 PM   Specimen: Urine, Clean Catch  Result Value Ref Range Status   Specimen Description URINE, CLEAN CATCH  Final   Special Requests NONE  Final   Culture   Final    NO GROWTH Performed at [facility], [address] [place], [place] [address]    Report Status 12/20/2020 FINAL  Final  Resp Panel by RT-PCR (Flu A&B, Covid) Nasopharyngeal Swab     Status: None   Collection Time: 12/18/20  7:45 PM   Specimen: Nasopharyngeal Swab; Nasopharyngeal(NP) swabs in vial transport medium  Result Value Ref Range Status   SARS Coronavirus 2 by RT PCR NEGATIVE NEGATIVE Final    Comment: (NOTE) SARS-CoV-2 target nucleic acids are NOT DETECTED.  The SARS-CoV-2 RNA is generally detectable in upper respiratory specimens during the acute phase of infection. The lowest concentration of SARS-CoV-2 viral copies this assay can detect is 138 copies/mL. A negative result does not preclude SARS-Cov-2 infection and should not be used as the sole basis for treatment or other patient management decisions. A negative result may occur with  improper specimen collection/handling, submission of specimen other than nasopharyngeal swab, presence of viral mutation(s) within the areas targeted by this assay, and inadequate number of viral copies(<138 copies/mL). A negative result must be combined with clinical observations, patient history, and epidemiological information. The expected result is Negative.  Fact Sheet for Patients:  EntrepreneurPulse.com.au  Fact Sheet for Healthcare Providers:  IncredibleEmployment.be  This test is no t yet approved or cleared by the [place] FDA and  has been authorized for detection and/or  diagnosis of SARS-CoV-2 by FDA under an Emergency Use Authorization (EUA). This EUA will remain  in effect (meaning this test can be used) for the duration of the COVID-19 declaration under Section 564(b)(1) of the Act, 21 U.S.C.section 360bbb-3(b)(1), unless the authorization is terminated  or revoked sooner.       Influenza A by PCR NEGATIVE NEGATIVE Final   Influenza B by PCR NEGATIVE NEGATIVE Final    Comment: (NOTE) The Xpert Xpress SARS-CoV-2/FLU/RSV plus assay is intended as an aid in the diagnosis of influenza from Nasopharyngeal swab specimens and should not be used as a sole basis for treatment. Nasal washings and aspirates are unacceptable for Xpert Xpress SARS-CoV-2/FLU/RSV testing.  Fact Sheet for Patients: EntrepreneurPulse.com.au  Fact Sheet for Healthcare Providers: IncredibleEmployment.be  This test is not yet approved or cleared by the [place] FDA and has been authorized for detection and/or diagnosis of SARS-CoV-2 by FDA under an Emergency Use Authorization (EUA). This EUA will remain in effect (meaning this test can be used) for the duration of the COVID-19 declaration under Section 564(b)(1) of the Act, 21 U.S.C. section 360bbb-3(b)(1), unless the authorization is terminated or revoked.  Performed at [facility], [place] [address] [place], [place] [address]     RADIOLOGY STUDIES/RESULTS: No results found.   LOS: 3 days   Oren Binet, MD  [facility]    To contact the attending provider between 7A-7P or the covering provider during after hours 7P-7A, please log into the web site [email or web address] and access using universal [place] password for that web site. If you do not have the password, please call the hospital operator.  12/22/2020, 1:49 PM

## 2020-12-22 NOTE — Plan of Care (Signed)
Pt is alert oriented x 4.  Ambulates with walker. Pt has pegtube, placement checked, flushed per order. Pt has his personal briefs on. Pt had bath then CHG bath before surgery. Personal belongings sent with pts Wife.    Problem: Education: Goal: Knowledge of General Education information will improve Description: Including pain rating scale, medication(s)/side effects and non-pharmacologic comfort measures Outcome: Progressing   Problem: Health Behavior/Discharge Planning: Goal: Ability to manage health-related needs will improve Outcome: Progressing   Problem: Clinical Measurements: Goal: Ability to maintain clinical measurements within normal limits will improve Outcome: Progressing Goal: Will remain free from infection Outcome: Progressing Goal: Diagnostic test results will improve Outcome: Progressing Goal: Respiratory complications will improve Outcome: Progressing Goal: Cardiovascular complication will be avoided Outcome: Progressing   Problem: Activity: Goal: Risk for activity intolerance will decrease Outcome: Progressing   Problem: Nutrition: Goal: Adequate nutrition will be maintained Outcome: Progressing   Problem: Coping: Goal: Level of anxiety will decrease Outcome: Progressing   Problem: Elimination: Goal: Will not experience complications related to bowel motility Outcome: Progressing Goal: Will not experience complications related to urinary retention Outcome: Progressing   Problem: Pain Managment: Goal: General experience of comfort will improve Outcome: Progressing   Problem: Safety: Goal: Ability to remain free from injury will improve Outcome: Progressing   Problem: Skin Integrity: Goal: Risk for impaired skin integrity will decrease Outcome: Progressing   Problem: Education: Goal: Knowledge of disease or condition will improve Outcome: Progressing Goal: Knowledge of secondary prevention will improve Outcome: Progressing

## 2020-12-22 NOTE — Anesthesia Procedure Notes (Signed)
Arterial Line Insertion Start/End9/23/2022 7:02 AM, 12/22/2020 7:07 AM Performed by: Duane Boston, MD, Annamary Carolin, CRNA, CRNA  Patient location: Pre-op. Preanesthetic checklist: patient identified, IV checked, site marked, risks and benefits discussed, surgical consent, monitors and equipment checked, pre-op evaluation, timeout performed and anesthesia consent Lidocaine 1% used for infiltration Left, radial was placed Catheter size: 20 G Hand hygiene performed , maximum sterile barriers used  and Seldinger technique used Allen's test indicative of satisfactory collateral circulation Attempts: 1 Procedure performed without using ultrasound guided technique. Following insertion, dressing applied and Biopatch. Post procedure assessment: normal  Patient tolerated the procedure well with no immediate complications. Additional procedure comments: With ease x1.

## 2020-12-22 NOTE — Discharge Summary (Signed)
Patient ID: John Walton MRN: [id number] DOB/AGE: 85-12-1932 85 y.o.  Admit date: 12/14/2020 Discharge date:12/15/2020  Primary Care Physician:  [name], MD  Discharge Diagnoses:   Present on Admission:  BPH with obstruction/lower urinary tract symptoms    Discharge Medications: Allergies as of 12/15/2020       Reactions   Robitussin Cold Cough+ Chest [dextromethorphan-guaifenesin] Swelling   Laryngeal edema - any cough syrup        Medication List     TAKE these medications    atorvastatin 40 MG tablet Commonly known as: LIPITOR Place 1 tablet (40 mg total) into feeding tube daily. What changed:  how to take this when to take this   calcium-vitamin D 500-200 MG-UNIT tablet Commonly known as: OSCAL WITH D Place 1 tablet into feeding tube with breakfast, with lunch, and with evening meal. What changed: how to take this   carboxymethylcellulose 0.5 % Soln Commonly known as: REFRESH PLUS Place 1 drop into both eyes in the morning, at noon, and at bedtime.   collagenase ointment Commonly known as: SANTYL Apply 1 application topically every other day. APPLIED TO HEEL WOUND   free water Soln Place 120 mLs into feeding tube 5 (five) times daily. What changed: when to take this   Insulin Pen Needle 29G X 12.7MM Misc Use as directed   latanoprost 0.005 % ophthalmic solution Commonly known as: XALATAN Place 1 drop into both eyes at bedtime.   methimazole 10 MG tablet Commonly known as: TAPAZOLE Take 10 mg by mouth in the morning.   ProSource Liqd Give 30 mLs by tube in the morning and at bedtime.   Simbrinza 1-0.2 % Susp Generic drug: Brinzolamide-Brimonidine Place 1 drop into both eyes in the morning and at bedtime.   sucralfate 1 GM/10ML suspension Commonly known as: CARAFATE Take 10 mLs (1 g total) by mouth 4 (four) times daily -  with meals and at bedtime. What changed: when to take this   Tears Again Oint Place 1 application into both  eyes in the morning and at bedtime.   Xiidra 5 % Soln Generic drug: Lifitegrast Place 1 drop into both eyes in the morning and at bedtime.         Significant Diagnostic Studies:  CT HEAD WO CONTRAST  Result Date: 12/18/2020 CLINICAL DATA:  TIA. EXAM: CT HEAD WITHOUT CONTRAST TECHNIQUE: Contiguous axial images were obtained from the base of the skull through the vertex without intravenous contrast. COMPARISON:  CT head 03/07/2020. FINDINGS: Brain: No evidence of acute infarction, hemorrhage, hydrocephalus, extra-axial collection or mass lesion/mass effect. There is stable mild diffuse atrophy. There is also stable mild periventricular white matter hypodensity, likely chronic small vessel ischemic change. 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 Vascular: Atherosclerotic calcifications are present within the cavernous internal carotid arteries. Skull: Normal. Negative for fracture or focal lesion. Sinuses/Orbits: No acute finding. Other: None. IMPRESSION: No acute intracranial abnormality. Electronically Signed   By: Ronney Asters M.D.   On: 12/18/2020 21:12   MR ANGIO HEAD WO CONTRAST  Result Date: 12/19/2020 CLINICAL DATA:  Stroke, follow-up EXAM: MRA HEAD WITHOUT CONTRAST TECHNIQUE: Angiographic images of the Circle of Willis were acquired using MRA technique without intravenous contrast. COMPARISON:  No pertinent prior exam. FINDINGS: Intracranial internal carotid arteries are patent. Middle and anterior cerebral arteries are patent. Intracranial vertebral arteries, basilar artery, posterior cerebral arteries are patent. Bilateral posterior communicating arteries are present. There is  no significant stenosis or aneurysm. IMPRESSION: Normal MRA head. Electronically Signed   By: Macy Mis M.D.   On: 12/19/2020 12:32   MR BRAIN WO CONTRAST  Result Date: 12/19/2020 CLINICAL DATA:  Initial evaluation for neuro deficit, stroke suspected. EXAM: MRI  HEAD WITHOUT CONTRAST TECHNIQUE: Multiplanar, multiecho pulse sequences of the brain and surrounding structures were obtained without intravenous contrast. COMPARISON:  CT from earlier the same day. FINDINGS: Brain: Generalized age-related cerebral atrophy. Patchy and confluent T2/FLAIR hyperintensity involving the periventricular deep white matter both cerebral hemispheres as well as the pons, most consistent with chronic small vessel ischemic disease moderate in nature. Encephalomalacia and gliosis involving the anterior right frontal region consistent with a chronic right MCA distribution infarct. Patchy multifocal areas of restricted diffusion seen involving the cortical gray matter of the right frontal, parietal, and occipital lobes, consistent with acute ischemic infarcts. These measure up to approximately 1 cm in size, and are somewhat watershed in distribution. No associated hemorrhage or mass effect. No other evidence for acute or subacute ischemia. Gray-white matter differentiation otherwise maintained. Few scattered additional chronic micro hemorrhages noted involving the right greater than left cerebral hemispheres, likely small vessel/hypertensive in nature. No mass lesion, midline shift or mass effect. No hydrocephalus or extra-axial fluid collection. Pituitary gland suprasellar region within normal limits. Midline structures intact. Vascular: Major intracranial vascular flow voids are grossly maintained. Skull and upper cervical spine: Craniocervical junction within normal limits. Bone marrow signal intensity normal. No scalp soft tissue abnormality. Sinuses/Orbits: Prior bilateral ocular lens replacement. Mild scattered mucosal thickening noted within the ethmoidal air cells. Paranasal sinuses are otherwise clear. No significant mastoid effusion. Inner ear structures grossly normal. Other: None. IMPRESSION: 1. Patchy multifocal acute ischemic infarcts involving the cortical gray matter of the right  frontal, parietal, and occipital lobes as above. No associated hemorrhage or mass effect. 2. 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 3. Underlying moderate chronic microvascular ischemic disease. Electronically Signed   By: Jeannine Boga M.D.   On: 12/19/2020 00:18   ECHOCARDIOGRAM COMPLETE  Result Date: 12/19/2020    ECHOCARDIOGRAM REPORT   Patient Name:   John Walton Date of Exam: 12/19/2020 Medical Rec #:  [id number]       Height:       66.0 in Accession #:    [id number]      Weight:       143.3 lb Date of Birth:  01/31/1933       BSA:          1.736 m Patient Age:    66 years        BP:           126/60 mmHg Patient Gender: M               HR:           57 bpm. Exam Location:  Inpatient Procedure: 2D Echo, Cardiac Doppler and Color Doppler Indications:     Stroke  History:         Patient has prior history of Echocardiogram examinations, most                  recent 03/08/2020. Abnormal ECG, Arrythmias:RBBB; Risk                  Factors:Hypertension and Dyslipidemia.  Sonographer:     Roseanna Rainbow RDCS Referring Phys:  [id number] [employer] [facility] Diagnosing Phys: Fransico Him MD IMPRESSIONS  1.  Left ventricular ejection fraction, by estimation, is 55 to 60%. The left ventricle has normal function. The left ventricle demonstrates regional wall motion abnormalities (see scoring diagram/findings for description). Left ventricular diastolic parameters were normal. There is hypokinesis of the left ventricular, apical inferior wall.  2. Right ventricular systolic function is normal. The right ventricular size is normal. Tricuspid regurgitation signal is inadequate for assessing PA pressure.  3. The mitral valve is normal in structure. Trivial mitral valve regurgitation. No evidence of mitral stenosis.  4. The aortic valve is calcified. There is severe calcifcation of the aortic valve. There is severe thickening of the aortic valve. Aortic valve regurgitation is trivial. Mild to  moderate aortic valve stenosis. Aortic valve area, by VTI measures 1.93 cm. Aortic valve mean gradient measures 16.0 mmHg. Aortic valve Vmax measures 2.49 m/s.  5. Aortic dilatation noted. There is mild dilatation of the aortic root, measuring 42 mm. There is mild dilatation of the ascending aorta, measuring 41 mm. FINDINGS  Left Ventricle: Left ventricular ejection fraction, by estimation, is 55 to 60%. The left ventricle has normal function. The left ventricle demonstrates regional wall motion abnormalities. The left ventricular internal cavity size was normal in size. There is no left ventricular hypertrophy. Left ventricular diastolic parameters were normal. Normal left ventricular filling pressure. Right Ventricle: The right ventricular size is normal. No increase in right ventricular wall thickness. Right ventricular systolic function is normal. Tricuspid regurgitation signal is inadequate for assessing PA pressure. Left Atrium: Left atrial size was normal in size. Right Atrium: Right atrial size was normal in size. Pericardium: There is no evidence of pericardial effusion. Mitral Valve: The mitral valve is normal in structure. Trivial mitral valve regurgitation. No evidence of mitral valve stenosis. MV peak gradient, 9.1 mmHg. The mean mitral valve gradient is 3.5 mmHg. Tricuspid Valve: The tricuspid valve is normal in structure. Tricuspid valve regurgitation is trivial. No evidence of tricuspid stenosis. Aortic Valve: The aortic valve is calcified. There is severe calcifcation of the aortic valve. There is severe thickening of the aortic valve. Aortic valve regurgitation is trivial. Mild to moderate aortic stenosis is present. Aortic valve mean gradient measures 16.0 mmHg. Aortic valve peak gradient measures 24.8 mmHg. Aortic valve area, by VTI measures 1.93 cm. Pulmonic Valve: The pulmonic valve was normal in structure. Pulmonic valve regurgitation is mild. No evidence of pulmonic stenosis. Aorta: Aortic  dilatation noted. There is mild dilatation of the aortic root, measuring 42 mm. There is mild dilatation of the ascending aorta, measuring 41 mm. Venous: The inferior vena cava was not well visualized. IAS/Shunts: No atrial level shunt detected by color flow Doppler.  LEFT VENTRICLE PLAX 2D LVIDd:         4.60 cm     Diastology LVIDs:         3.30 cm     LV e' medial:    8.49 cm/s LV PW:         1.00 cm     LV E/e' medial:  13.1 LV IVS:        1.00 cm     LV e' lateral:   9.79 cm/s LVOT diam:     2.30 cm     LV E/e' lateral: 11.3 LV SV:         106 LV SV Index:   61 LVOT Area:     4.15 cm  LV Volumes (MOD) LV vol d, MOD A2C: 63.4 ml LV vol d, MOD A4C: 89.9 ml LV  vol s, MOD A2C: 41.3 ml LV vol s, MOD A4C: 44.5 ml LV SV MOD A2C:     22.1 ml LV SV MOD A4C:     89.9 ml LV SV MOD BP:      35.4 ml RIGHT VENTRICLE             IVC RV S prime:     11.10 cm/s  IVC diam: 1.70 cm TAPSE (M-mode): 2.5 cm LEFT ATRIUM             Index       RIGHT ATRIUM           Index LA diam:        3.30 cm 1.90 cm/m  RA Area:     12.00 cm LA Vol (A2C):   46.5 ml 26.79 ml/m RA Volume:   27.50 ml  15.84 ml/m LA Vol (A4C):   26.9 ml 15.50 ml/m LA Biplane Vol: 34.3 ml 19.76 ml/m  AORTIC VALVE                    PULMONIC VALVE AV Area (Vmax):    2.12 cm     PR End Diast Vel: 1.42 msec AV Area (Vmean):   1.82 cm AV Area (VTI):     1.93 cm AV Vmax:           249.00 cm/s AV Vmean:          189.000 cm/s AV VTI:            0.550 m AV Peak Grad:      24.8 mmHg AV Mean Grad:      16.0 mmHg LVOT Vmax:         127.00 cm/s LVOT Vmean:        82.600 cm/s LVOT VTI:          0.255 m LVOT/AV VTI ratio: 0.46  AORTA Ao Root diam: 4.20 cm Ao Asc diam:  4.10 cm MITRAL VALVE MV Area (PHT): 4.49 cm     SHUNTS MV Area VTI:   3.41 cm     Systemic VTI:  0.26 m MV Peak grad:  9.1 mmHg     Systemic Diam: 2.30 cm MV Mean grad:  3.5 mmHg MV Vmax:       1.51 m/s MV Vmean:      84.4 cm/s MV Decel Time: 169 msec MV E velocity: 111.00 cm/s MV A velocity: 133.00 cm/s  MV E/A ratio:  0.83 Fransico Him MD Electronically signed by Fransico Him MD Signature Date/Time: 12/19/2020/11:22:31 AM    Final (Updated)    VAS US CAROTID  Result Date: 12/21/2020 Carotid Arterial Duplex Study Patient Name:  John Walton  Date of Exam:   12/19/2020 Medical Rec #: [id number]        Accession #:    [id number] Date of Birth: October 28, 1932        Patient Gender: M Patient Age:   87 years Exam Location:  [facility] Procedure:      VAS US CAROTID Referring Phys: Lesleigh [name] --------------------------------------------------------------------------------  Indications:      CVA. Risk Factors:     Hypertension, hyperlipidemia, Diabetes, coronary artery                   disease. Comparison Study: 09/27/20 Performing Technologist: Archie Patten RVS  Examination Guidelines: A complete evaluation includes B-mode imaging, spectral Doppler, color Doppler, and power Doppler as needed of all accessible portions of each  vessel. Bilateral testing is considered an integral part of a complete examination. Limited examinations for reoccurring indications may be performed as noted.  Right Carotid Findings: +---------+--------+-------+--------+---------------------------------+--------+          PSV cm/sEDV    StenosisPlaque Description               Comments                  cm/s                                                     +---------+--------+-------+--------+---------------------------------+--------+ CCA Prox 72      13             heterogenous                              +---------+--------+-------+--------+---------------------------------+--------+ CCA      63      14             heterogenous                              Distal                                                                    +---------+--------+-------+--------+---------------------------------+--------+ ICA Prox 428     122    80-99%  heterogenous, calcific and                                                 irregular                                 +---------+--------+-------+--------+---------------------------------+--------+ ICA Mid  327     72                                                       +---------+--------+-------+--------+---------------------------------+--------+ ICA      152     39                                                       Distal                                                                    +---------+--------+-------+--------+---------------------------------+--------+  ECA      114                                                              +---------+--------+-------+--------+---------------------------------+--------+ +----------+--------+-------+--------+-------------------+           PSV cm/sEDV cmsDescribeArm Pressure (mmHG) +----------+--------+-------+--------+-------------------+ [id number]                                        +----------+--------+-------+--------+-------------------+ +---------+--------+--+--------+--+---------+ VertebralPSV cm/s56EDV cm/s17Antegrade +---------+--------+--+--------+--+---------+  Left Carotid Findings: +----------+--------+--------+--------+------------------+--------+           PSV cm/sEDV cm/sStenosisPlaque DescriptionComments +----------+--------+--------+--------+------------------+--------+ CCA Prox  112     17              heterogenous               +----------+--------+--------+--------+------------------+--------+ CCA Distal115     18              heterogenous               +----------+--------+--------+--------+------------------+--------+ ICA Prox  120     24      1-39%   heterogenous               +----------+--------+--------+--------+------------------+--------+ ICA Distal74      21                                         +----------+--------+--------+--------+------------------+--------+ ECA       119                                                 +----------+--------+--------+--------+------------------+--------+ +----------+--------+--------+--------+-------------------+           PSV cm/sEDV cm/sDescribeArm Pressure (mmHG) +----------+--------+--------+--------+-------------------+ [id number]                                          +----------+--------+--------+--------+-------------------+ +---------+--------+--+--------+--+---------+ VertebralPSV cm/s58EDV cm/s19Antegrade +---------+--------+--+--------+--+---------+   Summary: Right Carotid: Velocities in the right ICA are consistent with a 80-99%                stenosis. Left Carotid: Velocities in the left ICA are consistent with a 1-39% stenosis. Vertebrals: Bilateral vertebral arteries demonstrate antegrade flow. *See table(s) above for measurements and observations.  Electronically signed by Antony Contras MD on 12/21/2020 at 9:30:41 AM.    Final     Brief H and P: For complete details please refer to admission H and P, but in brief pt admitted for TURP for catheter-dependent BPH.  Hospital Course:  Pt underwent TURP on day of admission and had uncomplicated postoperative course. He was discharged on POD 1 following successful voiding trial.   Day of Discharge BP 129/66 (BP Location: Left Arm)   Pulse 94   Temp 98.3 F (36.8 C) (Oral)   Resp 20   Ht 5\' 6"  (1.676 m)   Wt 64.6 kg   SpO2  99%   BMI 23.00 kg/m   No results found for this or any previous visit (from the past 24 hour(s)).  Physical Exam: General: Alert and awake oriented x3 not in any acute distress. HEENT: anicteric sclera, pupils reactive to light and accommodation CVS: S1-S2 clear no murmur rubs or gallops Chest: clear to auscultation bilaterally, no wheezing rales or rhonchi Abdomen: soft nontender, nondistended, normal bowel sounds, no organomegaly Extremities: no cyanosis, clubbing or edema noted bilaterally Neuro: Cranial nerves II-XII  intact, no focal neurological deficits  Disposition:  Home  Diet:  Regular  Activity:  Reviewed with pt   TESTS THAT NEED FOLLOW-UP   Pathology review  DISCHARGE FOLLOW-UP   Follow-up Information     Karen Kays, NP Follow up.   Specialty: Nurse Practitioner Why: 10.6.2022 @ 1 PM Contact information: [address] [place] [address] [phone]                 Time spent on Discharge:    10 mins  Signed: Jorja Loa 12/22/2020, 6:34 AM

## 2020-12-22 NOTE — Anesthesia Postprocedure Evaluation (Signed)
Anesthesia Post Note  Patient: John Walton  Procedure(s) Performed: RIGHT CAROTID ENDARTERECTOMY (Right)     Patient location during evaluation: PACU Anesthesia Type: General Level of consciousness: sedated Pain management: pain level controlled Vital Signs Assessment: post-procedure vital signs reviewed and stable Respiratory status: spontaneous breathing and respiratory function stable Cardiovascular status: stable Postop Assessment: no apparent nausea or vomiting Anesthetic complications: no   No notable events documented.  Last Vitals:  Vitals:   12/22/20 1200 12/22/20 1217  BP: (!) 132/53 (!) 139/55  Pulse: 77 77  Resp: 15 18  Temp:    SpO2: 99% 98%    Last Pain:  Vitals:   12/22/20 1200  TempSrc:   PainSc: Asleep                 Danyiel Crespin DANIEL

## 2020-12-22 NOTE — Interval H&P Note (Signed)
History and Physical Interval Note:  12/22/2020 7:14 AM  John Walton  has presented today for surgery, with the diagnosis of Symptomatic Right Carotid Stenosis.  The various methods of treatment have been discussed with the patient and family. After consideration of risks, benefits and other options for treatment, the patient has consented to  Procedure(s): RIGHT CAROTID ENDARTERECTOMY (Right) as a surgical intervention.  The patient's history has been reviewed, patient examined, no change in status, stable for surgery.  I have reviewed the patient's chart and labs.  Questions were answered to the patient's satisfaction.     Deitra Mayo

## 2020-12-22 NOTE — Progress Notes (Signed)
PT Cancellation Note  Patient Details Name: John Walton MRN: [id number] DOB: Jan 27, 1933   Cancelled Treatment:    Reason Eval/Treat Not Completed: Patient at procedure or test/unavailable Pt in OR. Will follow up when available or as time allows.   [name] 12/22/2020, 9:47 AM Marisa Severin, PT, DPT Acute Rehabilitation Services Pager [phone] Office [phone]

## 2020-12-22 NOTE — Progress Notes (Signed)
Inpatient Rehabilitation Admissions Coordinator   I will follow up after postoperative PT and OT evals to assist with planning dispo needs of CIR vs home with HH/OP.  Danne Baxter, RN, MSN Rehab Admissions Coordinator [phone] 12/22/2020 10:58 AM

## 2020-12-22 NOTE — Transfer of Care (Signed)
Immediate Anesthesia Transfer of Care Note  Patient: John Walton  Procedure(s) Performed: RIGHT CAROTID ENDARTERECTOMY (Right)  Patient Location: PACU  Anesthesia Type:General  Level of Consciousness: awake, alert , oriented and patient cooperative  Airway & Oxygen Therapy: Patient Spontanous Breathing and Patient connected to nasal cannula oxygen  Post-op Assessment: Report given to RN, Post -op Vital signs reviewed and stable and Patient moving all extremities X 4  Post vital signs: Reviewed and stable  Last Vitals:  Vitals Value Taken Time  BP 146/56 12/22/20 1001  Temp    Pulse 85 12/22/20 1002  Resp 13 12/22/20 1002  SpO2 100 % 12/22/20 1002  Vitals shown include unvalidated device data.  Last Pain:  Vitals:   12/22/20 0436  TempSrc: Oral  PainSc:          Complications: No notable events documented.

## 2020-12-22 NOTE — Progress Notes (Signed)
   VASCULAR SURGERY POST OP:   Doing well post op.  BP well controlled.   He is on ASA and a statin.   SUBJECTIVE:   No complaints except sore throat.  PHYSICAL EXAM:   Vitals:   12/22/20 1400 12/22/20 1430 12/22/20 1445 12/22/20 1512  BP: (!) 156/59 (!) 147/62 (!) 159/63 (!) 163/65  Pulse: 75 78 78 80  Resp: (!) 24 16 20 12   Temp:    98.3 F (36.8 C)  TempSrc:    Oral  SpO2: 99% 96% 98% 99%  Weight:      Height:       Neuro: no focal weakness Incision looks fine  LABS:   Lab Results  Component Value Date   WBC 5.8 12/22/2020   HGB 8.9 (L) 12/22/2020   HCT 27.6 (L) 12/22/2020   MCV 88.2 12/22/2020   PLT 328 12/22/2020   Lab Results  Component Value Date   CREATININE 1.88 (H) 12/22/2020   Lab Results  Component Value Date   INR 1.2 12/18/2020   CBG (last 3)  Recent Labs    12/21/20 1243 12/21/20 1654 12/22/20 1000  GLUCAP 211* 160* 128*    PROBLEM LIST:    Principal Problem:   Acute ischemic stroke (HCC) Active Problems:   Essential hypertension   CKD (chronic kidney disease), stage IV (HCC)   Dysphagia   Protein-calorie malnutrition, severe   DM (diabetes mellitus), type 2 (HCC)   S/P TURP (status post transurethral resection of prostate)   ABLA (acute blood loss anemia)   Symptomatic stenosis of right carotid artery   CURRENT MEDS:    acetaminophen  650 mg Oral Once   aspirin EC  325 mg Oral Daily   atorvastatin  40 mg Oral QHS   brinzolamide  1 drop Both Eyes BID   And   brimonidine  1 drop Both Eyes BID   calcium-vitamin D  1 tablet Oral TID with meals   collagenase  1 application Topical QODAY   cyanocobalamin  1,000 mcg Subcutaneous Daily   feeding supplement (PROSource TF)  45 mL Per Tube BID   fentaNYL       ferrous sulfate  325 mg Oral BID WC   free water  120 mL Per Tube BID   heparin injection (subcutaneous)  5,000 Units Subcutaneous Q8H   insulin aspart  0-9 Units Subcutaneous TID WC   latanoprost  1 drop Both Eyes  QHS   Lifitegrast  1 drop Both Eyes BID   methimazole  10 mg Oral Q breakfast   polyvinyl alcohol  1 drop Both Eyes TID   sucralfate  1 g Oral TID [employer] Office: [phone] 12/22/2020

## 2020-12-22 NOTE — Op Note (Signed)
NAME: John Walton    MRN: [id number] DOB: February 20, 1933    DATE OF OPERATION: 12/22/2020  PREOP DIAGNOSIS:    Symptomatic right carotid stenosis  POSTOP DIAGNOSIS:    Same  PROCEDURE:    Right carotid endarterectomy with primary closure  SURGEON: Judeth Cornfield. Scot Dock, MD  ASSIST: Arlee Muslim, PA  ANESTHESIA: General  EBL: 50 cc  INDICATIONS:    John Walton is a 87 y.o. male who presented with a right brain stroke.  He had a greater than 80% right carotid stenosis and right carotid endarterectomy was recommended in order to lower his risk of future stroke.  FINDINGS:   The plaque extended very high but I was able to get above this.  The artery was fairly large and easily took a 12 mm dilator.  I closed this primarily.  TECHNIQUE:   The patient was taken to the operating room after an arterial line was placed by anesthesia.  He received a general anesthetic.  The neck was carefully positioned.  I infiltrated 1% lidocaine with epinephrine.  The right neck was prepped and draped in usual sterile fashion.  An incision was made along the anterior border of the sternocleidomastoid and the dissection carried down to the common carotid artery which was dissected free and controlled with Rummel tourniquet.  The patient was heparinized.  ACT was monitored throughout the procedure.  The facial vein was advised between 2-0 silk ties.  I then controlled the internal carotid artery distally.  The plaque extended fairly high.  I elected to use the [facility].  The external carotid artery was also controlled.  Clamps were then placed on the internal and the common and the external carotid artery.  A longitudinal arteriotomy was made in the common carotid artery and this was extended through the plaque into the internal carotid artery above the plaque.  The 12 shot was placed into the internal carotid artery backbled and then placed into the common carotid artery and secured  with Rummel tourniquet.  Flow was reestablished to the shunt and checked with the Doppler.  An endarterectomy plane was established proximally and the plaque was sharply divided.  Eversion endarterectomy was performed of the external carotid artery.  Distally there was a nice tapering the plaque and no tacking sutures were required.  The artery was irrigated with copious amounts of heparin and dextran and all loose debris removed.  The artery was quite large and I felt that if we patch this it would be aneurysmal.  I elected to therefore close this primarily.  The artery was closed with a running 6-0 Prolene suture from each end.  Prior to completing the closure the shunt was removed.  The arteries were backbled and flushed appropriately and the anastomosis completed.  Flow was reestablished first to the external carotid artery and into the internal carotid artery.  There was an excellent signal in the internal carotid artery at the completion.  The external carotid artery had poor flow I therefore elected to open this.  It was clamped proximally and distally and a small transverse arteriotomy was made.  A plaque was removed and then the arteriotomy was closed with running 6-0 Prolene suture.  At the completion was an excellent signal in the external carotid artery.  Hemostasis was obtained in the wound.  The heparin was partially reversed with protamine.  The wound was closed with a deep layer of 3-0 Vicryl.  The platysma was closed with running  3-0 Vicryl.  The skin was closed with 4-0 Monocryl.  Dermabond was applied.  The patient awoke neurologically intact.  All needle and sponge counts were correct.  He was transferred to recovery room in stable condition.  Given the complexity of the case a first assistant was necessary in order to expedient the procedure and safely perform the technical aspects of the operation.  Deitra Mayo, MD, FACS [facility]  DATE OF  DICTATION:   12/22/2020

## 2020-12-22 NOTE — Anesthesia Procedure Notes (Signed)
Procedure Name: Intubation Date/Time: 12/22/2020 7:31 AM Performed by: Annamary Carolin, CRNA Pre-anesthesia Checklist: Patient identified, Emergency Drugs available, Suction available and Patient being monitored Patient Re-evaluated:Patient Re-evaluated prior to induction Oxygen Delivery Method: Circle System Utilized Preoxygenation: Pre-oxygenation with 100% oxygen Induction Type: IV induction Ventilation: Mask ventilation without difficulty Laryngoscope Size: Mac and 3 Grade View: Grade I Tube type: Oral Number of attempts: 1 Airway Equipment and Method: Stylet Placement Confirmation: ETT inserted through vocal cords under direct vision, positive ETCO2 and breath sounds checked- equal and bilateral Secured at: 23 cm Tube secured with: Tape Dental Injury: Teeth and Oropharynx as per pre-operative assessment  Comments: With ease

## 2020-12-22 NOTE — Progress Notes (Signed)
Pt received bath and CHG bath prior to surgery. Personal belongings sent with Pts wife.

## 2020-12-23 ENCOUNTER — Encounter (HOSPITAL_COMMUNITY): Payer: Self-pay | Admitting: Vascular Surgery

## 2020-12-23 DIAGNOSIS — I639 Cerebral infarction, unspecified: Secondary | ICD-10-CM

## 2020-12-23 DIAGNOSIS — D649 Anemia, unspecified: Secondary | ICD-10-CM | POA: Diagnosis not present

## 2020-12-23 DIAGNOSIS — D62 Acute posthemorrhagic anemia: Secondary | ICD-10-CM | POA: Diagnosis not present

## 2020-12-23 DIAGNOSIS — N390 Urinary tract infection, site not specified: Secondary | ICD-10-CM | POA: Diagnosis not present

## 2020-12-23 LAB — GLUCOSE, CAPILLARY
Glucose-Capillary: 101 mg/dL — ABNORMAL HIGH (ref 70–99)
Glucose-Capillary: 133 mg/dL — ABNORMAL HIGH (ref 70–99)
Glucose-Capillary: 206 mg/dL — ABNORMAL HIGH (ref 70–99)
Glucose-Capillary: 180 mg/dL — ABNORMAL HIGH (ref 70–99)

## 2020-12-23 LAB — BASIC METABOLIC PANEL
Anion gap: 5 (ref 5–15)
BUN: 30 mg/dL — ABNORMAL HIGH (ref 8–23)
CO2: 23 mmol/L (ref 22–32)
Calcium: 8.5 mg/dL — ABNORMAL LOW (ref 8.9–10.3)
Chloride: 109 mmol/L (ref 98–111)
Creatinine, Ser: 1.81 mg/dL — ABNORMAL HIGH (ref 0.61–1.24)
GFR, Estimated: 36 mL/min — ABNORMAL LOW (ref >60)
Glucose, Bld: 108 mg/dL — ABNORMAL HIGH (ref 70–99)
Potassium: 4.5 mmol/L (ref 3.5–5.1)
Sodium: 137 mmol/L (ref 135–145)

## 2020-12-23 LAB — CBC
HCT: 26.6 % — ABNORMAL LOW (ref 39.0–52.0)
Hemoglobin: 8.4 g/dL — ABNORMAL LOW (ref 13.0–17.0)
MCH: 28.3 pg (ref 26.0–34.0)
MCHC: 31.6 g/dL (ref 30.0–36.0)
MCV: 89.6 fL (ref 80.0–100.0)
Platelets: 314 10*3/uL (ref 150–400)
RBC: 2.97 MIL/uL — ABNORMAL LOW (ref 4.22–5.81)
RDW: 13.9 % (ref 11.5–15.5)
WBC: 7.3 10*3/uL (ref 4.0–10.5)
nRBC: 0 % (ref 0.0–0.2)

## 2020-12-23 NOTE — Progress Notes (Signed)
PROGRESS NOTE        PATIENT DETAILS Name: John Walton Age: 87 y.o. Sex: male Date of Birth: [birth year] Admit Date: 12/18/2020 Admitting Physician Etta Quill, DO PIR:JJOACZY, Fransico Him, MD  Brief Narrative: Patient is a 87 y.o. male with history of known right carotid artery stenosis, HTN, HLD, DM-2, CKD stage IV-recent TURP on 9/15-presenting with left eye blurry vision, left hand numbness/weakness and dysarthria.  She was found to have acute CVA and subsequently admitted to the hospitalist service.  See below for further details.    Subjective: Awake/alert-lying comfortably in bed-denies any chest pain or shortness of breath.  Objective: Vitals: Blood pressure (!) 98/47, pulse 89, temperature 98.5 F (36.9 C), temperature source Oral, resp. rate 18, height 5\' 6"  (1.676 m), weight 65 kg, SpO2 97 %.   Exam: Gen Exam:Alert awake-not in any distress HEENT:atraumatic, normocephalic Chest: B/L clear to auscultation anteriorly CVS:S1S2 regular Abdomen:soft non tender, non distended Extremities:no edema Neurology: Non focal Skin: no rash   Pertinent Labs/Radiology: WBC: 7.3 Hb: 8.4 Na: 137 K: 4.5 Creatinine: 1.81  Microbiology: 9/19>>Urine Culture: No growth  Stroke work up: 9/19>> CT head: No acute intracranial abnormality. 9/19>> MRI brain: Patchy multifocal acute infarcts involving right frontal/parietal and occipital lobes. 9/20>> A1c: 6.5 9/20>> LDL: 58 9/20>> carotid Doppler: 80-99% stenosis on right, 1-39% on left.   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  Dysarthria/Right sided mild weakness has resolved. No further recommendations from neurology-patient remains on aspirin and statin. Underwent Right Carotid endarterectomy on 9/23.    Symptomatic right carotid artery  stenosis: Vascular surgery following-patient is s/p carotid endarterectomy on 9/23.  Given the fact that he had wall motion abnormalities on echo-cardiology consulted-okay to proceed with surgery.  Acute blood loss anemia (due to hematuria/recent TURP) superimposed on anemia due to chronic disease/renal failure: s/p 2 units of PRBC transfusion-Hb now stable. Have placed on weekly aranesp. Follow Hb  BPH-s/p TURP: Did have hematuria post TURP-this has since resolved.  Spoke with primary urologist-Dr. [name] on 9/20-okay to place patient on antiplatelets-okay to proceed with endarterectomy-and okay for intraoperative heparin.  If significant hematuria reoccurs-recommends we place a Foley catheter.  ?  UTI versus asymptomatic bacteriuria: Recent TURP-UA with significant amount of RBCs/WBCs-no symptoms of UTI-given 1 dose of IV Rocephin-cultures are negative-continue to monitor of antimicrobial therapy.  AKI on CKD stage IV: AKI likely hemodynamically mediated in the setting of hematuria/recent surgery-creatinine improving and close to baseline.  Avoid nephrotoxic agents-follow closely.    DM-2: Diet controlled at home-continue SSI-CBG stable.  Recent Labs    12/22/20 2107 12/23/20 0606 12/23/20 1214  GLUCAP 107* 101* 133*     HTN: BP stable-monitor off antihypertensives and resume as able.  HLD: On statin  Hyperthyroidism: On methimazole-TSH stable.  History of transient dysphagia due to severe esophageal spasm in the setting of allergy to Robitussin-PEG tube remains in place: Although patient able to consume orally without any issues.  Per spouse-PEG tube is used for occasional nutritional supplements  Procedures: 9/23>> right CEA  Consults: Neurology, vascular surgery, cardiology DVT Prophylaxis: SQ Heparin Code Status:Full code Family Communication: Spouse at bedside on 9/20-no one at bedside today.  Time spent: 25 minutes-Greater than 50% of this time was spent in  counseling,  explanation of diagnosis, planning of further management, and coordination of care.  Diet: Diet Order             Diet heart healthy/carb modified Room service appropriate? No; Fluid consistency: Thin  Diet effective now                     Disposition Plan: Status is: Inpatient  Remains inpatient appropriate because:Inpatient level of care appropriate due to severity of illness  Dispo: The patient is from: Home              Anticipated d/c is to: CIR              Patient currently is medically stable to d/c.   Difficult to place patient No  Barriers to Discharge: Awaiting CIR bed.  Antimicrobial agents: Anti-infectives (From admission, onward)    Start     Dose/Rate Route Frequency Ordered Stop   12/22/20 2000  ceFAZolin (ANCEF) IVPB 2g/100 mL premix        2 g 200 mL/hr over 30 Minutes Intravenous  Once 12/22/20 1544 12/22/20 2214   12/22/20 0800  cefUROXime (ZINACEF) 1.5 g in sodium chloride 0.9 % 100 mL IVPB        1.5 g 200 mL/hr over 30 Minutes Intravenous On call to O.R. 12/21/20 7371 12/23/20 0559   12/22/20 0700  ceFAZolin (ANCEF) IVPB 2g/100 mL premix       Note to Pharmacy: Send with pt to OR   2 g 200 mL/hr over 30 Minutes Intravenous On call 12/21/20 0752 12/22/20 0738   12/18/20 2345  cefTRIAXone (ROCEPHIN) 1 g in sodium chloride 0.9 % 100 mL IVPB        1 g 200 mL/hr over 30 Minutes Intravenous  Once 12/18/20 2343 12/19/20 0120        MEDICATIONS: Scheduled Meds:  acetaminophen  650 mg Oral Once   aspirin EC  81 mg Oral Daily   atorvastatin  40 mg Oral QHS   brinzolamide  1 drop Both Eyes BID   And   brimonidine  1 drop Both Eyes BID   calcium-vitamin D  1 tablet Oral TID with meals   collagenase  1 application Topical QODAY   cyanocobalamin  1,000 mcg Subcutaneous Daily   docusate sodium  100 mg Oral Daily   feeding supplement (PROSource TF)  45 mL Per Tube BID   ferrous sulfate  325 mg Oral BID WC   free water  120 mL Per Tube BID    heparin  5,000 Units Subcutaneous Q8H   insulin aspart  0-9 Units Subcutaneous TID WC   latanoprost  1 drop Both Eyes QHS   Lifitegrast  1 drop Both Eyes BID   methimazole  10 mg Oral Q breakfast   pantoprazole  40 mg Oral Daily   polyvinyl alcohol  1 drop Both Eyes TID   sucralfate  1 g Oral TID AC   Continuous Infusions:  sodium chloride     PRN Meds:.sodium chloride, acetaminophen **OR** acetaminophen (TYLENOL) oral liquid 160 mg/5 mL **OR** acetaminophen, alum & mag hydroxide-simeth, hydrALAZINE, HYDROcodone-acetaminophen, labetalol, metoprolol tartrate, morphine injection, ondansetron, phenol   I have personally reviewed following labs and imaging studies  LABORATORY DATA: CBC: Recent Labs  Lab 12/18/20 1944 12/18/20 1952 12/19/20 0742 12/20/20 0332 12/21/20 0322 12/22/20 0322 12/23/20 0059  WBC 11.4*  --  6.9 5.0 6.0 5.8 7.3  NEUTROABS 9.1*  --   --   --   --   --   --  HGB 7.2*   < > 7.6* 7.5* 7.4* 8.9* 8.4*  HCT 23.4*   < > 24.2* 22.6* 22.5* 27.6* 26.6*  MCV 90.3  --  90.6 86.6 87.2 88.2 89.6  PLT 380  --  277 268 305 328 314   < > = values in this interval not displayed.     Basic Metabolic Panel: Recent Labs  Lab 12/19/20 0742 12/20/20 0332 12/21/20 0322 12/22/20 0322 12/23/20 0059  NA 138 138 137 137 137  K 4.1 4.2 4.2 4.4 4.5  CL 111 109 108 107 109  CO2 19* 20* 21* 23 23  GLUCOSE 89 115* 141* 101* 108*  BUN 42* 38* 40* 36* 30*  CREATININE 2.29* 1.92* 1.76* 1.88* 1.81*  CALCIUM 8.1* 8.1* 8.5* 9.1 8.5*  PHOS  --  4.3  --   --   --      GFR: Estimated Creatinine Clearance: 26.4 mL/min (A) (by C-G formula based on SCr of 1.81 mg/dL (H)).  Liver Function Tests: Recent Labs  Lab 12/18/20 1944 12/20/20 0332  AST 13*  --   ALT 8  --   ALKPHOS 83  --   BILITOT 0.1*  --   PROT 6.9  --   ALBUMIN 3.0* 2.2*    No results for input(s): LIPASE, AMYLASE in the last 168 hours. No results for input(s): AMMONIA in the last 168  hours.  Coagulation Profile: Recent Labs  Lab 12/18/20 1944  INR 1.2     Cardiac Enzymes: No results for input(s): CKTOTAL, CKMB, CKMBINDEX, TROPONINI in the last 168 hours.  BNP (last 3 results) No results for input(s): PROBNP in the last 8760 hours.  Lipid Profile: No results for input(s): CHOL, HDL, LDLCALC, TRIG, CHOLHDL, LDLDIRECT in the last 72 hours.   Thyroid Function Tests: No results for input(s): TSH, T4TOTAL, FREET4, T3FREE, THYROIDAB in the last 72 hours.   Anemia Panel: No results for input(s): VITAMINB12, FOLATE, FERRITIN, TIBC, IRON, RETICCTPCT in the last 72 hours.   Urine analysis:    Component Value Date/Time   COLORURINE YELLOW 12/18/2020 1934   APPEARANCEUR TURBID (A) 12/18/2020 1934   LABSPEC 1.025 12/18/2020 1934   PHURINE 5.5 12/18/2020 1934   GLUCOSEU NEGATIVE 12/18/2020 1934   GLUCOSEU 500 (A) 07/11/2014 0850   HGBUR LARGE (A) 12/18/2020 1934   BILIRUBINUR NEGATIVE 12/18/2020 1934   KETONESUR NEGATIVE 12/18/2020 1934   PROTEINUR >300 (A) 12/18/2020 1934   UROBILINOGEN 0.2 07/11/2014 0850   NITRITE NEGATIVE 12/18/2020 1934   LEUKOCYTESUR SMALL (A) 12/18/2020 1934    Sepsis Labs: Lactic Acid, Venous    Component Value Date/Time   LATICACIDVEN 1.9 07/16/2020 1601    MICROBIOLOGY: Recent Results (from the past 240 hour(s))  Surgical PCR screen     Status: None   Collection Time: 12/14/20 10:53 PM   Specimen: Nasal Mucosa; Nasal Swab  Result Value Ref Range Status   MRSA, PCR NEGATIVE NEGATIVE Final   Staphylococcus aureus NEGATIVE NEGATIVE Final    Comment: (NOTE) The Xpert SA Assay (FDA approved for NASAL specimens in patients 50 years of age and older), is one component of a comprehensive surveillance program. It is not intended to diagnose infection nor to guide or monitor treatment. Performed at [facility], [place] [address] [place], [place] [address]   Urine Culture     Status: None   Collection Time:  12/18/20  7:34 PM   Specimen: Urine, Clean Catch  Result Value Ref Range Status   Specimen  Description URINE, CLEAN CATCH  Final   Special Requests NONE  Final   Culture   Final    NO GROWTH Performed at [facility], [place] [address], [place], [place] [address]    Report Status 12/20/2020 FINAL  Final  Resp Panel by RT-PCR (Flu A&B, Covid) Nasopharyngeal Swab     Status: None   Collection Time: 12/18/20  7:45 PM   Specimen: Nasopharyngeal Swab; Nasopharyngeal(NP) swabs in vial transport medium  Result Value Ref Range Status   SARS Coronavirus 2 by RT PCR NEGATIVE NEGATIVE Final    Comment: (NOTE) SARS-CoV-2 target nucleic acids are NOT DETECTED.  The SARS-CoV-2 RNA is generally detectable in upper respiratory specimens during the acute phase of infection. The lowest concentration of SARS-CoV-2 viral copies this assay can detect is 138 copies/mL. A negative result does not preclude SARS-Cov-2 infection and should not be used as the sole basis for treatment or other patient management decisions. A negative result may occur with  improper specimen collection/handling, submission of specimen other than nasopharyngeal swab, presence of viral mutation(s) within the areas targeted by this assay, and inadequate number of viral copies(<138 copies/mL). A negative result must be combined with clinical observations, patient history, and epidemiological information. The expected result is Negative.  Fact Sheet for Patients:  EntrepreneurPulse.com.au  Fact Sheet for Healthcare Providers:  IncredibleEmployment.be  This test is no t yet approved or cleared by the [place] FDA and  has been authorized for detection and/or diagnosis of SARS-CoV-2 by FDA under an Emergency Use Authorization (EUA). This EUA will remain  in effect (meaning this test can be used) for the duration of the COVID-19 declaration under Section 564(b)(1) of the Act,  21 U.S.C.section 360bbb-3(b)(1), unless the authorization is terminated  or revoked sooner.       Influenza A by PCR NEGATIVE NEGATIVE Final   Influenza B by PCR NEGATIVE NEGATIVE Final    Comment: (NOTE) The Xpert Xpress SARS-CoV-2/FLU/RSV plus assay is intended as an aid in the diagnosis of influenza from Nasopharyngeal swab specimens and should not be used as a sole basis for treatment. Nasal washings and aspirates are unacceptable for Xpert Xpress SARS-CoV-2/FLU/RSV testing.  Fact Sheet for Patients: EntrepreneurPulse.com.au  Fact Sheet for Healthcare Providers: IncredibleEmployment.be  This test is not yet approved or cleared by the [place] FDA and has been authorized for detection and/or diagnosis of SARS-CoV-2 by FDA under an Emergency Use Authorization (EUA). This EUA will remain in effect (meaning this test can be used) for the duration of the COVID-19 declaration under Section 564(b)(1) of the Act, 21 U.S.C. section 360bbb-3(b)(1), unless the authorization is terminated or revoked.  Performed at [facility], [place] [address] [place], [place] [address]     RADIOLOGY STUDIES/RESULTS: No results found.   LOS: 4 days   Oren Binet, MD  [facility]    To contact the attending provider between 7A-7P or the covering provider during after hours 7P-7A, please log into the web site [email or web address] and access using universal [place] password for that web site. If you do not have the password, please call the hospital operator.  12/23/2020, 3:08 PM

## 2020-12-23 NOTE — Progress Notes (Addendum)
Occupational Therapy Treatment Patient Details Name: John Walton MRN: [id number] DOB: 14-Jun-1932 Today's Date: 12/23/2020   History of present illness Pt is an 87 y/o male admitted 9/19 secondary to dysarthria, visual deficits, and L hand numbness. 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 Pt also found to have R carotid stenosis and is now s/p R carotid endarterectomy on 9/23. PMH includes glaucoma, CKD, DM, HTN, PVD.   OT comments  Pt seen post R carotid endarterectomy.  Patient requires min guard for transfers and mobility in room using RW.  Mod assist for LB ADLs and min guard for grooming. He remains limited by decreased activity tolerance, impaired balance, weakness.  Questionable cognition, pt tangential and it is difficult to determine due to [facility].  Spouse reports she assisted with LB dressing at baseline (socks). Believe he will continue to benefit from acute OT and post acute CIR to optimize independence, safety and decrease burden of care.  Will follow.    Recommendations for follow up therapy are one component of a multi-disciplinary discharge planning process, led by the attending physician.  Recommendations may be updated based on patient status, additional functional criteria and insurance authorization.    Follow Up Recommendations  CIR    Equipment Recommendations  3 in 1 bedside commode    Recommendations for Other Services Rehab consult    Precautions / Restrictions Precautions Precautions: Fall Precaution Comments: peg, incontinent after prostate surgery last month, wife brought briefs Restrictions Weight Bearing Restrictions: No       Mobility Bed Mobility Overal bed mobility: Needs Assistance Bed Mobility: Supine to Sit     Supine to sit: Min assist     General bed mobility comments: OOB in recliner upon entry    Transfers Overall transfer level: Needs assistance Equipment used: Rolling walker (2 wheeled) Transfers: Sit  to/from Stand Sit to Stand: Min guard         General transfer comment: min guard with cueing for hand placement    Balance Overall balance assessment: Needs assistance Sitting-balance support: No upper extremity supported;Feet supported Sitting balance-Leahy Scale: Good     Standing balance support: Bilateral upper extremity supported;No upper extremity supported;During functional activity Standing balance-Leahy Scale: Fair Standing balance comment: pt relies on UE support, leaning foward at sink for BUE support washing hands                           ADL either performed or assessed with clinical judgement   ADL Overall ADL's : Needs assistance/impaired     Grooming: Wash/dry hands;Min guard;Standing               Lower Body Dressing: Moderate assistance;Sit to/from stand Lower Body Dressing Details (indicate cue type and reason): require assist for socks, min guard sit to stand and able to simulate donning pants with mod assist Toilet Transfer: Min guard;Ambulation;RW Toilet Transfer Details (indicate cue type and reason): simulated in room         Functional mobility during ADLs: Min guard;Rolling walker;Cueing for safety General ADL Comments: pt limited by impaired balance, decreased activity tolerance and generalized weakness.     Vision   Additional Comments: difficult to assess, spouse reports blurry vision initally with CVA but cleared.  Currently pt reports vision is baseline, but is affected by glare and low light.  Functionally able to avoid obstalces and scan to locate items as needed during ADLS.   Perception  Praxis      Cognition Arousal/Alertness: Awake/alert Behavior During Therapy: WFL for tasks assessed/performed Overall Cognitive Status: Difficult to assess Area of Impairment: Attention;Problem solving                   Current Attention Level: Selective         Problem Solving: Slow processing;Requires verbal  cues General Comments: pt with slowed processed, tangential and requires redirection/repeated cues. difficult to determine HOH vs cog deficits.  Spouse reports progressive STM deficits.        Exercises     Shoulder Instructions       General Comments VSS on RA    Pertinent Vitals/ Pain       Pain Assessment: No/denies pain Pain Intervention(s): Monitored during session  Home Living                                          Prior Functioning/Environment              Frequency  Min 2X/week        Progress Toward Goals  OT Goals(current goals can now be found in the care plan section)  Progress towards OT goals: Progressing toward goals  Acute Rehab OT Goals Patient Stated Goal: home with wife OT Goal Formulation: With patient  Plan Discharge plan remains appropriate;Frequency remains appropriate    Co-evaluation                 AM-PAC OT "6 Clicks" Daily Activity     Outcome Measure   Help from another person eating meals?: A Little Help from another person taking care of personal grooming?: A Little Help from another person toileting, which includes using toliet, bedpan, or urinal?: A Lot Help from another person bathing (including washing, rinsing, drying)?: A Lot Help from another person to put on and taking off regular upper body clothing?: A Little Help from another person to put on and taking off regular lower body clothing?: A Lot 6 Click Score: 15    End of Session Equipment Utilized During Treatment: Rolling walker  OT Visit Diagnosis: Unsteadiness on feet (R26.81);Muscle weakness (generalized) (M62.81);Pain   Activity Tolerance Patient tolerated treatment well   Patient Left in chair;with call bell/phone within reach;with chair alarm set;with family/visitor present   Nurse Communication Mobility status;Precautions        Time: 6004-5997 OT Time Calculation (min): 25 min  Charges: OT General Charges $OT Visit:  1 Visit OT Treatments $Self Care/Home Management : 23-37 mins  [place] Pager [phone] Office [phone]   Delight Stare 12/23/2020, 2:16 PM

## 2020-12-23 NOTE — Progress Notes (Addendum)
  Progress Note    12/23/2020 9:09 AM 1 Day Post-Op  Subjective:  no complaints   Vitals:   12/23/20 0600 12/23/20 0724  BP: (!) 163/60 (!) 158/63  Pulse: 73 83  Resp: 11 16  Temp:  (!) 97.5 F (36.4 C)  SpO2: 94% 97%   Physical Exam: Cardiac:  regular Lungs:  non labored Incisions:  right neck incision is clean, dry and intact without swelling or hematoma Extremities:  moving all extremities without deficits Neurologic: CN intact. Speech coherent. Smile symmetric. Tongue midline  CBC    Component Value Date/Time   WBC 7.3 12/23/2020 0059   RBC 2.97 (L) 12/23/2020 0059   HGB 8.4 (L) 12/23/2020 0059   HCT 26.6 (L) 12/23/2020 0059   PLT 314 12/23/2020 0059   MCV 89.6 12/23/2020 0059   MCH 28.3 12/23/2020 0059   MCHC 31.6 12/23/2020 0059   RDW 13.9 12/23/2020 0059   LYMPHSABS 1.0 12/18/2020 1944   MONOABS 1.1 (H) 12/18/2020 1944   EOSABS 0.1 12/18/2020 1944   BASOSABS 0.0 12/18/2020 1944    BMET    Component Value Date/Time   NA 137 12/23/2020 0059   K 4.5 12/23/2020 0059   CL 109 12/23/2020 0059   CO2 23 12/23/2020 0059   GLUCOSE 108 (H) 12/23/2020 0059   BUN 30 (H) 12/23/2020 0059   CREATININE 1.81 (H) 12/23/2020 0059   CALCIUM 8.5 (L) 12/23/2020 0059   GFRNONAA 36 (L) 12/23/2020 0059   GFRAA 40 (L) 04/23/2015 1231    INR    Component Value Date/Time   INR 1.2 12/18/2020 1944     Intake/Output Summary (Last 24 hours) at 12/23/2020 0909 Last data filed at 12/23/2020 0517 Gross per 24 hour  Intake 1690.85 ml  Output 750 ml  Net 940.85 ml     Assessment/Plan:  87 y.o. male is s/p right CEA 1 Day Post-Op   Doing well post op. Minimal pain. Neurologically intact. Right neck incision c/d/I without swelling or hematoma. Tolerating diet. Voiding without difficulty. Hemodynamically stable. Will continue Aspirin and Statin. Okay for discharge from vascular standpoint. Will have follow up in 2 weeks in our office   Karoline Caldwell, [facility] [phone] 12/23/2020 9:09 AM   I have interviewed and examined patient with PA and agree with assessment and plan above.   Kourtnie Sachs C. Donzetta Matters, MD [facility] [place] Office: [phone] Pager: [phone]

## 2020-12-23 NOTE — Progress Notes (Signed)
Aline removed per order. Procedure well tolerated.

## 2020-12-23 NOTE — Discharge Instructions (Addendum)
Follow with Primary MD Tisovec, Fransico Him, MD in 7 days   Get CBC, CMP, B12, anaemia Panel, TSH   -  checked next visit within 1 week by Primary MD   Activity: As tolerated with Full fall precautions use walker/cane & assistance as needed  Disposition Home     Diet: Heart Healthy Low Carb  Special Instructions: If you have smoked or chewed Tobacco  in the last 2 yrs please stop smoking, stop any regular Alcohol  and or any Recreational drug use.  On your next visit with your primary care physician please Get Medicines reviewed and adjusted.  Please request your Prim.MD to go over all Hospital Tests and Procedure/Radiological results at the follow up, please get all Hospital records sent to your Prim MD by signing hospital release before you go home.  If you experience worsening of your admission symptoms, develop shortness of breath, life threatening emergency, suicidal or homicidal thoughts you must seek medical attention immediately by calling 911 or calling your MD immediately  if symptoms less severe.  You Must read complete instructions/literature along with all the possible adverse reactions/side effects for all the Medicines you take and that have been prescribed to you. Take any new Medicines after you have completely understood and accpet all the possible adverse reactions/side effects.         [facility]  Discharge Instructions   Carotid Surgery  Please refer to the following instructions for your post-procedure care. Your surgeon or physician assistant will discuss any changes with you.  Activity  You are encouraged to walk as much as you can. You can slowly return to normal activities but must avoid strenuous activity and heavy lifting until your doctor tell you it's okay. Avoid activities such as vacuuming or swinging a golf club. You can drive after one week if you are comfortable and you are no longer taking prescription pain  medications. It is normal to feel tired for serval weeks after your surgery. It is also normal to have difficulty with sleep habits, eating, and bowel movements after surgery. These will go away with time.  Bathing/Showering  Shower daily after you go home. Do not soak in a bathtub, hot tub, or swim until the incision heals completely.  Incision Care  Shower every day. Clean your incision with mild soap and water. Pat the area dry with a clean towel. You do not need a bandage unless otherwise instructed. Do not apply any ointments or creams to your incision. You may have skin glue on your incision. Do not peel it off. It will come off on its own in about one week. Your incision may feel thickened and raised for several weeks after your surgery. This is normal and the skin will soften over time.   For Men Only: It's okay to shave around the incision but do not shave the incision itself for 2 weeks. It is common to have numbness under your chin that could last for several months.  Diet  Resume your normal diet. There are no special food restrictions following this procedure. A low fat/low cholesterol diet is recommended for all patients with vascular disease. In order to heal from your surgery, it is CRITICAL to get adequate nutrition. Your body requires vitamins, minerals, and protein. Vegetables are the best source of vitamins and minerals. Vegetables also provide the perfect balance of protein. Processed food has little nutritional value, so try to avoid this.  Medications  Resume taking  all of your medications unless your doctor or physician assistant tells you not to. If your incision is causing pain, you may take over-the- counter pain relievers such as acetaminophen (Tylenol). If you were prescribed a stronger pain medication, please be aware these medications can cause nausea and constipation. Prevent nausea by taking the medication with a snack or meal. Avoid constipation by drinking plenty  of fluids and eating foods with a high amount of fiber, such as fruits, vegetables, and grains.  Do not take Tylenol if you are taking prescription pain medications.  Follow Up  Our office will schedule a follow up appointment 2-3 weeks following discharge.  Please call us immediately for any of the following conditions  Increased pain, redness, drainage (pus) from your incision site. Fever of 101 degrees or higher. If you should develop stroke (slurred speech, difficulty swallowing, weakness on one side of your body, loss of vision) you should call 911 and go to the nearest emergency room.  Reduce your risk of vascular disease:  Stop smoking. If you would like help call [facility] at 1-800-QUIT-NOW 815-107-4830) or [place] at [phone]. Manage your cholesterol Maintain a desired weight Control your diabetes Keep your blood pressure down  If you have any questions, please call the office at [phone].

## 2020-12-23 NOTE — Progress Notes (Addendum)
Physical Therapy Treatment Patient Details Name: John Walton MRN: [id number] DOB: 1932/08/03 Today's Date: 12/23/2020   History of Present Illness Pt is an 87 y/o male admitted 9/19 secondary to dysarthria, visual deficits, and L hand numbness. 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 Pt also found to have R carotid stenosis and is now s/p R carotid endarterectomy on 9/23. PMH includes glaucoma, CKD, DM, HTN, PVD.    PT Comments    The pt presents after R carotid endarterectomy with minimal detectable change in performance compared to sessions prior to surgery. The pt continues to benefit from minA to miNG to complete bed mobility and initial sit-stand transfers due to deficits in power and dynamic stability. He completed 125 ft hallway ambulation and required minG and frequent cues for posture, positioning in RW, and directions to complete safety. At this time the pt is progressing well with mobility but could still benefit from CIR level therapies to improve safety and independence as well as to facilitate return to prior level of independence. Will continue to follow and re-evaluate d/c plan as the pt progresses.   Recommendations for follow up therapy are one component of a multi-disciplinary discharge planning process, led by the attending physician.  Recommendations may be updated based on patient status, additional functional criteria and insurance authorization.  Follow Up Recommendations  CIR     Equipment Recommendations   (defer to post acute)    Recommendations for Other Services       Precautions / Restrictions Precautions Precautions: Fall Precaution Comments: peg, incontinent after prostate surgery last month, wife brought briefs Restrictions Weight Bearing Restrictions: No     Mobility  Bed Mobility Overal bed mobility: Needs Assistance Bed Mobility: Supine to Sit     Supine to sit: Min assist     General bed mobility comments:  minA to complete with HOB elevated, significantly increased time    Transfers Overall transfer level: Needs assistance Equipment used: Rolling walker (2 wheeled) Transfers: Sit to/from Stand Sit to Stand: Min assist;Min guard         General transfer comment: minA initially, but able to progress within session to minG with increased time  Ambulation/Gait Ambulation/Gait assistance: Min guard Gait Distance (Feet): 125 Feet Assistive device: Rolling walker (2 wheeled) Gait Pattern/deviations: Step-through pattern;Decreased stride length;Trunk flexed;Narrow base of support Gait velocity: 0.2 m/s Gait velocity interpretation: <1.31 ft/sec, indicative of household ambulator General Gait Details: pt with narrow BOS and trunk flexed. repeated cues for upright posture and proximity to RW       Balance Overall balance assessment: Needs assistance Sitting-balance support: Feet supported Sitting balance-Leahy Scale: Good     Standing balance support: Single extremity supported Standing balance-Leahy Scale: Fair Standing balance comment: pt dependent on at least single UE support for static standing, BUE support for gait                            Cognition Arousal/Alertness: Awake/alert Behavior During Therapy: WFL for tasks assessed/performed Overall Cognitive Status: Difficult to assess Area of Impairment: Attention;Problem solving                   Current Attention Level: Selective         Problem Solving: Slow processing General Comments: pt with slowed processing, very tangential and at times needing repeated cues for mobility. difficult to discern [facility] vs cog      Exercises  General Comments General comments (skin integrity, edema, etc.): VSS on RA      Pertinent Vitals/Pain Pain Assessment: No/denies pain Pain Intervention(s): Monitored during session     PT Goals (current goals can now be found in the care plan section) Acute Rehab  PT Goals Patient Stated Goal: home with wife PT Goal Formulation: With patient Time For Goal Achievement: 01/02/21 Potential to Achieve Goals: Good Progress towards PT goals: Progressing toward goals    Frequency    Min 4X/week      PT Plan Current plan remains appropriate       AM-PAC PT "6 Clicks" Mobility   Outcome Measure  Help needed turning from your back to your side while in a flat bed without using bedrails?: A Little Help needed moving from lying on your back to sitting on the side of a flat bed without using bedrails?: A Little Help needed moving to and from a bed to a chair (including a wheelchair)?: A Little Help needed standing up from a chair using your arms (e.g., wheelchair or bedside chair)?: A Little Help needed to walk in hospital room?: A Little Help needed climbing 3-5 steps with a railing? : A Lot 6 Click Score: 17    End of Session Equipment Utilized During Treatment: Gait belt Activity Tolerance: Patient tolerated treatment well Patient left: in chair;with call bell/phone within reach;with chair alarm set Nurse Communication: Mobility status PT Visit Diagnosis: Unsteadiness on feet (R26.81);Muscle weakness (generalized) (M62.81)     Time: 1275-1700 PT Time Calculation (min) (ACUTE ONLY): 36 min  Charges:  $Gait Training: 8-22 mins $Therapeutic Activity: 8-22 mins                     [place] Carbo, PT, DPT   Acute Rehabilitation Department Pager #: [phone]   Sandra Cockayne 12/23/2020, 12:24 PM

## 2020-12-24 DIAGNOSIS — I639 Cerebral infarction, unspecified: Secondary | ICD-10-CM | POA: Diagnosis not present

## 2020-12-24 LAB — GLUCOSE, CAPILLARY
Glucose-Capillary: 166 mg/dL — ABNORMAL HIGH (ref 70–99)
Glucose-Capillary: 183 mg/dL — ABNORMAL HIGH (ref 70–99)
Glucose-Capillary: 172 mg/dL — ABNORMAL HIGH (ref 70–99)
Glucose-Capillary: 176 mg/dL — ABNORMAL HIGH (ref 70–99)

## 2020-12-24 MED ORDER — HYDROCODONE-ACETAMINOPHEN 5-325 MG PO TABS
1.0000 | ORAL_TABLET | Freq: Four times a day (QID) | ORAL | Status: DC | PRN
  Administered 2020-12-24 – 2020-12-25 (×2): 1 via ORAL
  Filled 2020-12-24 (×2): qty 1

## 2020-12-24 MED ORDER — CARVEDILOL 3.125 MG PO TABS
3.1250 mg | ORAL_TABLET | Freq: Two times a day (BID) | ORAL | Status: DC
  Administered 2020-12-24: 3.125 mg via ORAL
  Filled 2020-12-24: qty 1

## 2020-12-24 NOTE — Progress Notes (Signed)
PROGRESS NOTE        PATIENT DETAILS Name: John Walton Age: 87 y.o. Sex: male Date of Birth: 1933-01-30 Admit Date: 12/18/2020 Admitting Physician Etta Quill, DO [id number], Fransico Him, MD  Brief Narrative: Patient is a 87 y.o. male with history of known right carotid artery stenosis, HTN, HLD, DM-2, CKD stage IV-recent TURP on 9/15-presenting with left eye blurry vision, left hand numbness/weakness and dysarthria.  She was found to have acute CVA and subsequently admitted to the hospitalist service.  See below for further details.    Subjective: Patient in bed, appears comfortable, denies any headache, no fever, no chest pain or pressure, no shortness of breath , no abdominal pain. No new focal weakness.   Objective: Vitals: Blood pressure (!) 182/80, pulse 88, temperature 98 F (36.7 C), temperature source Axillary, resp. rate 10, height 5\' 6"  (1.676 m), weight 65 kg, SpO2 98 %.   Exam:  Awake Alert, No new F.N deficits, Normal affect [place].AT,PERRAL Supple Neck,No JVD, No cervical lymphadenopathy appriciated.  Symmetrical Chest wall movement, Good air movement bilaterally, CTAB RRR,No Gallops, Rubs or new Murmurs, No Parasternal Heave +ve B.Sounds, Abd Soft, PEG in place. No Cyanosis, Clubbing or edema, No new Rash or bruise   Microbiology: 9/19>>Urine Culture: No growth  Stroke work up: 9/19>> CT head: No acute intracranial abnormality. 9/19>> MRI brain: Patchy multifocal acute infarcts involving right frontal/parietal and occipital lobes. 9/20>> A1c: 6.5 9/20>> LDL: 58 9/20>> carotid Doppler: 80-99% stenosis on right, 1-39% on left.   9/20>> TTE: EF 55-60%,+ve regional wall motion abnormalities  Assessment/Plan:  Acute right frontal/parietal and occipital lobe stroke: Likely thromboembolic-due to known right carotid artery stenosis.  Dysarthria/Right sided mild weakness has resolved. No further recommendations from neurology-patient  remains on aspirin and statin. Underwent Right Carotid endarterectomy on 9/23.    Symptomatic right carotid artery stenosis: Vascular surgery following-patient is s/p carotid endarterectomy on 9/23.  Given the fact that he had wall motion abnormalities on echo-cardiology consulted-okay to proceed with surgery.  Acute blood loss anemia (due to hematuria/recent TURP) superimposed on anemia due to chronic disease/renal failure: s/p 2 units of PRBC transfusion-Hb now stable. Have placed on weekly aranesp. Follow Hb  BPH-s/p TURP: Did have hematuria post TURP-this has since resolved.  Spoke with primary urologist-Dr. [name] on 9/20-okay to place patient on antiplatelets-okay to proceed with endarterectomy-and okay for intraoperative heparin.  If significant hematuria reoccurs-recommends we place a Foley catheter.  ?  UTI versus asymptomatic bacteriuria: Recent TURP-UA with significant amount of RBCs/WBCs-no symptoms of UTI-given 1 dose of IV Rocephin-cultures are negative-continue to monitor of antimicrobial therapy.  AKI on CKD stage IV: AKI likely hemodynamically mediated in the setting of hematuria/recent surgery-creatinine improving and close to baseline.  Avoid nephrotoxic agents-follow closely.    DM-2: Diet controlled at home-continue SSI-CBG stable.  Recent Labs    12/23/20 1622 12/23/20 2106 12/24/20 0625  GLUCAP 206* 180* 166*    HTN: BP slightly on the higher side will add low-dose Coreg and monitor.  HLD: On statin  Hyperthyroidism: On methimazole-TSH stable.  History of transient dysphagia due to severe esophageal spasm in the setting of allergy to Robitussin-PEG tube remains in place: Although patient able to consume orally without any issues.  Per spouse-PEG tube is used for occasional nutritional supplements  TTE showing preserved EF but with positive wall motion  abnormality.  Continue aspirin and statin for secondary prevention, will add Coreg if blood pressure tolerates,  chest pain-free, post discharge follow with cardiology outpatient.   Procedures: 9/23>> right CEA  Consults: Neurology, vascular surgery, cardiology DVT Prophylaxis: SQ Heparin Code Status:Full code Family Communication: Spouse at bedside on 9/20-no one at bedside today.  Time spent: 25 minutes-Greater than 50% of this time was spent in counseling, explanation of diagnosis, planning of further management, and coordination of care.  Diet: Diet Order             Diet heart healthy/carb modified Room service appropriate? No; Fluid consistency: Thin  Diet effective now                     Disposition Plan: Status is: Inpatient  Remains inpatient appropriate because:Inpatient level of care appropriate due to severity of illness  Dispo: The patient is from: Home              Anticipated d/c is to: CIR              Patient currently is medically stable to d/c.   Difficult to place patient No  Barriers to Discharge: Awaiting CIR bed.  Antimicrobial agents: Anti-infectives (From admission, onward)    Start     Dose/Rate Route Frequency Ordered Stop   12/22/20 2000  ceFAZolin (ANCEF) IVPB 2g/100 mL premix        2 g 200 mL/hr over 30 Minutes Intravenous  Once 12/22/20 1544 12/22/20 2214   12/22/20 0800  cefUROXime (ZINACEF) 1.5 g in sodium chloride 0.9 % 100 mL IVPB        1.5 g 200 mL/hr over 30 Minutes Intravenous On call to O.R. 12/21/20 6644 12/23/20 0559   12/22/20 0700  ceFAZolin (ANCEF) IVPB 2g/100 mL premix       Note to Pharmacy: Send with pt to OR   2 g 200 mL/hr over 30 Minutes Intravenous On call 12/21/20 0752 12/22/20 0738   12/18/20 2345  cefTRIAXone (ROCEPHIN) 1 g in sodium chloride 0.9 % 100 mL IVPB        1 g 200 mL/hr over 30 Minutes Intravenous  Once 12/18/20 2343 12/19/20 0120        MEDICATIONS: Scheduled Meds:  acetaminophen  650 mg Oral Once   aspirin EC  81 mg Oral Daily   atorvastatin  40 mg Oral QHS   brinzolamide  1 drop Both Eyes BID    And   brimonidine  1 drop Both Eyes BID   calcium-vitamin D  1 tablet Oral TID with meals   collagenase  1 application Topical QODAY   cyanocobalamin  1,000 mcg Subcutaneous Daily   docusate sodium  100 mg Oral Daily   feeding supplement (PROSource TF)  45 mL Per Tube BID   ferrous sulfate  325 mg Oral BID WC   free water  120 mL Per Tube BID   heparin  5,000 Units Subcutaneous Q8H   insulin aspart  0-9 Units Subcutaneous TID WC   latanoprost  1 drop Both Eyes QHS   Lifitegrast  1 drop Both Eyes BID   methimazole  10 mg Oral Q breakfast   pantoprazole  40 mg Oral Daily   polyvinyl alcohol  1 drop Both Eyes TID   sucralfate  1 g Oral TID AC   Continuous Infusions:  sodium chloride     PRN Meds:.sodium chloride, acetaminophen **OR** acetaminophen (TYLENOL) oral liquid 160 mg/5 mL **OR**  acetaminophen, alum & mag hydroxide-simeth, hydrALAZINE, HYDROcodone-acetaminophen, labetalol, metoprolol tartrate, morphine injection, ondansetron, phenol   I have personally reviewed following labs and imaging studies  LABORATORY DATA:  Recent Labs  Lab 12/18/20 1944 12/18/20 1952 12/19/20 0742 12/20/20 0332 12/21/20 0322 12/22/20 0322 12/23/20 0059  WBC 11.4*  --  6.9 5.0 6.0 5.8 7.3  HGB 7.2*   < > 7.6* 7.5* 7.4* 8.9* 8.4*  HCT 23.4*   < > 24.2* 22.6* 22.5* 27.6* 26.6*  PLT 380  --  277 268 305 328 314  MCV 90.3  --  90.6 86.6 87.2 88.2 89.6  MCH 27.8  --  28.5 28.7 28.7 28.4 28.3  MCHC 30.8  --  31.4 33.2 32.9 32.2 31.6  RDW 14.1  --  14.0 13.9 14.1 13.9 13.9  LYMPHSABS 1.0  --   --   --   --   --   --   MONOABS 1.1*  --   --   --   --   --   --   EOSABS 0.1  --   --   --   --   --   --   BASOSABS 0.0  --   --   --   --   --   --    < > = values in this interval not displayed.    Recent Labs  Lab 12/18/20 1944 12/18/20 1952 12/19/20 0742 12/19/20 0803 12/20/20 0332 12/21/20 0322 12/22/20 0322 12/23/20 0059  NA 136   < > 138  --  138 137 137 137  K 4.9   < > 4.1  --   4.2 4.2 4.4 4.5  CL 108   < > 111  --  109 108 107 109  CO2 21*  --  19*  --  20* 21* 23 23  GLUCOSE 160*   < > 89  --  115* 141* 101* 108*  BUN 43*   < > 42*  --  38* 40* 36* 30*  CREATININE 2.45*   < > 2.29*  --  1.92* 1.76* 1.88* 1.81*  CALCIUM 8.4*  --  8.1*  --  8.1* 8.5* 9.1 8.5*  AST 13*  --   --   --   --   --   --   --   ALT 8  --   --   --   --   --   --   --   ALKPHOS 83  --   --   --   --   --   --   --   BILITOT 0.1*  --   --   --   --   --   --   --   ALBUMIN 3.0*  --   --   --  2.2*  --   --   --   INR 1.2  --   --   --   --   --   --   --   TSH  --   --   --  1.259  --   --   --   --   HGBA1C  --   --  6.5*  --   --   --   --   --    < > = values in this interval not displayed.         RADIOLOGY STUDIES/RESULTS: No results found.   LOS: 5 days   Signature  Lala Lund M.D  on 12/24/2020 at 11:12 AM   -  To page go to [email or web address]

## 2020-12-25 DIAGNOSIS — Z9889 Other specified postprocedural states: Secondary | ICD-10-CM

## 2020-12-25 DIAGNOSIS — R531 Weakness: Secondary | ICD-10-CM | POA: Diagnosis not present

## 2020-12-25 DIAGNOSIS — R2990 Unspecified symptoms and signs involving the nervous system: Secondary | ICD-10-CM

## 2020-12-25 DIAGNOSIS — I6521 Occlusion and stenosis of right carotid artery: Secondary | ICD-10-CM | POA: Diagnosis not present

## 2020-12-25 DIAGNOSIS — I639 Cerebral infarction, unspecified: Secondary | ICD-10-CM | POA: Diagnosis not present

## 2020-12-25 LAB — GLUCOSE, CAPILLARY
Glucose-Capillary: 126 mg/dL — ABNORMAL HIGH (ref 70–99)
Glucose-Capillary: 218 mg/dL — ABNORMAL HIGH (ref 70–99)
Glucose-Capillary: 188 mg/dL — ABNORMAL HIGH (ref 70–99)
Glucose-Capillary: 190 mg/dL — ABNORMAL HIGH (ref 70–99)

## 2020-12-25 MED ORDER — CARVEDILOL 6.25 MG PO TABS
6.2500 mg | ORAL_TABLET | Freq: Two times a day (BID) | ORAL | Status: DC
  Administered 2020-12-25: 6.25 mg via ORAL
  Filled 2020-12-25: qty 1

## 2020-12-25 MED ORDER — LACTATED RINGERS IV BOLUS
500.0000 mL | Freq: Once | INTRAVENOUS | Status: AC
  Administered 2020-12-25: 500 mL via INTRAVENOUS

## 2020-12-25 MED ORDER — CARVEDILOL 3.125 MG PO TABS
3.1250 mg | ORAL_TABLET | Freq: Two times a day (BID) | ORAL | Status: DC
  Administered 2020-12-25 – 2020-12-26 (×2): 3.125 mg via ORAL
  Filled 2020-12-25 (×2): qty 1

## 2020-12-25 NOTE — Progress Notes (Signed)
Inpatient Rehabilitation Admissions Coordinator   I spoke with pt's wife by phone. I updated her that he has progressed with therapy and is not in need of a CIR admit at this level. She is disappointed , but aware., Acute team and TOC made aware. We will sign off at this time.  Danne Baxter, RN, MSN Rehab Admissions Coordinator [phone] 12/25/2020 10:07 AM

## 2020-12-25 NOTE — TOC Initial Note (Addendum)
Transition of Care (TOC) - Initial/Assessment Note  Marvetta Gibbons RN, BSN Transitions of Care Unit 4E- RN Case Manager See Treatment Team for direct phone #    Patient Details  Name: John Walton MRN: [id number] Date of Birth: 28-Feb-1933  Transition of Care [facility]) CM/SW Contact:    Dawayne Patricia, RN Phone Number: 12/25/2020, 1:34 PM  Clinical Narrative:                 Pt has progressed well post op CEA, per Pamala Hurry with CIR pt does not need intensity of inpt rehab at this time and have recommended lower level therapy. Per updated PT/OT notes today recommendations have been made for outpt therapy followup.  CM spoke with pt and wife at the bedside to discuss transition needs. Per conversation with wife pt was active with [facility] prior to admit for HHRN/PT/OT and aide. Discussed the difference between [facility] and outpt therapy. Wife would like pt to be able to get out and about to go to outpt therapy however she states she feels he still needs HH as well. Explained to wife that pt can not do both HH and outpt at the same, discussed pt staying with [facility] on initial discharge then transitioning to outpt therapy when [facility] feels appropriate.  Wife agreeable to resuming [facility] services for now- would like to stay with [facility] and would like to transition to outpt neuro when [facility] feels appropriate.   Confirmed with wife- address, phone #s and PCP- pt also goes to the [place]- primary care there is Smitty Pluck with the [facility] clinic. (fax- [phone])  Pt has needed DME- including RW, 3n1, w/c and lift chair.  Wife also reports that pt has been approved for 11 hours of aide assistance through the [place] that will start post discharge.   Will plan for transition home tomorrow, wife agreeable and will plan to transport pt home tomorrow.   Call made to [facility] with [facility] services confirmed and request for resumption accepted.    Expected Discharge Plan: [place] Barriers to Discharge: No Barriers Identified   Patient Goals and CMS Choice Patient states their goals for this hospitalization and ongoing recovery are:: return home CMS Medicare.gov Compare Post Acute Care list provided to:: Patient Represenative (must comment) (wife) Choice offered to / list presented to : Patient, Spouse  Expected Discharge Plan and Services Expected Discharge Plan: [place]   Discharge Planning Services: CM Consult Post Acute Care Choice: Home Health, Resumption of Svcs/PTA Provider Living arrangements for the past 2 months: Single Family Home                 DME Arranged: N/A DME Agency: NA       HH Arranged: RN, PT, OT, Nurse's Aide HH Agency: [facility] Date HH Agency Contacted: 12/25/20 Time HH Agency Contacted: 84 Representative spoke with at [place]: [place] Arrangements/Services Living arrangements for the past 2 months: [place] with:: Spouse Patient language and need for interpreter reviewed:: Yes Do you feel safe going back to the place where you live?: Yes      Need for Family Participation in Patient Care: Yes (Comment) Care giver support system in place?: Yes (comment) Current home services: DME (RW,3n1,w/c, chair lift) Criminal Activity/Legal Involvement Pertinent to Current Situation/Hospitalization: No - Comment as needed  Activities of Daily Living      Permission Sought/Granted Permission sought  to share information with : Facility Art therapist granted to share information with : Yes, Verbal Permission Granted     Permission granted to share info w AGENCY: [facility]        Emotional Assessment Appearance:: Appears stated age Attitude/Demeanor/Rapport: Engaged Affect (typically observed): Appropriate, Accepting Orientation: : Oriented to Self, Oriented to Place, Oriented to  Time, Oriented to Situation Alcohol / Substance Use: Not  Applicable Psych Involvement: No (comment)  Admission diagnosis:  Weakness [R53.1] Acute ischemic stroke ([place]) [I63.9] Stroke-like symptoms [R29.90] Urinary tract infection with hematuria, site unspecified [N39.0, R31.9] Anemia, unspecified type [D64.9] Patient Active Problem List   Diagnosis Date Noted   Acute ischemic stroke ([place]) 12/19/2020   S/P TURP (status post transurethral resection of prostate) 12/19/2020   ABLA (acute blood loss anemia) 12/19/2020   Symptomatic stenosis of right carotid artery 12/19/2020   BPH with obstruction/lower urinary tract symptoms 12/14/2020   Hypocalcemia    Protein-calorie malnutrition, severe 03/29/2020   Hypokalemia 03/29/2020   Hypernatremia 03/29/2020   DM (diabetes mellitus), type 2 ([place]) 03/29/2020   Fever 03/29/2020   Debility 03/29/2020   Dysphagia 03/26/2020   Bilateral hydronephrosis 03/08/2020   Unilateral primary osteoarthritis, left hip    Femoral neck fracture ([place]) 03/07/2020   CKD (chronic kidney disease), stage IV ([place]) 03/07/2020   Normocytic anemia 03/07/2020   BPH (benign prostatic hyperplasia) 03/07/2020   Abdominal mass 03/07/2020   Scalp hematoma 03/07/2020   Hypertensive urgency 03/07/2020   Pain due to onychomycosis of toenails of both feet 08/13/2019   RBBB 09/02/2014   Screening, ischemic heart disease 07/11/2014   Medicare annual wellness visit, subsequent 07/11/2014   Prostate cancer screening 07/11/2014   Essential hypertension 01/11/2014   Hyperlipidemia 01/11/2014   PCP:  [name], MD Pharmacy:   [id number] - [place], [place] - [address] [place] [address] Phone: [phone] Fax: [place], [place] - [address] [place] [address] Phone: [phone] Fax: [phone]     Social Determinants of Health (SDOH) Interventions    Readmission Risk  Interventions Readmission Risk Prevention Plan 12/25/2020  Transportation Screening Complete  Medication Review Press photographer) Complete  PCP or Specialist appointment within 3-5 days of discharge Complete  HRI or [place] Complete  SW Recovery Care/Counseling Consult Complete  Palliative Care Screening Not [place] Not Applicable  Some recent data might be hidden

## 2020-12-25 NOTE — Progress Notes (Addendum)
Occupational Therapy Treatment Patient Details Name: John Walton MRN: [id number] DOB: [birth year] Today's Date: 12/25/2020   History of present illness Pt is an 87 y/o male admitted 9/19 secondary to dysarthria, visual deficits, and L hand numbness. 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 Pt also found to have R carotid stenosis and is now s/p R carotid endarterectomy on 9/23. PMH includes glaucoma, CKD, DM, HTN, PVD.   OT comments  Pt progressing well towards OT goals. Pt able to mobilize in hallway with Supervision assist using RW, complete ADLs standing at sink with Setup assist and able to scan to L visual field effectively (use of glasses during session). Cognition WFL for tasks assessed this AM. Based on functional abilities today, updated DC recs to [facility] follow-up.   Addendum: Discussed pt case with PT who collaborated with pt wife on DC plans. Both pt/wife agreeable for OP therapies and able to provide transportation assist. DC recs updated to reflect this.    Recommendations for follow up therapy are one component of a multi-disciplinary discharge planning process, led by the attending physician.  Recommendations may be updated based on patient status, additional functional criteria and insurance authorization.    Follow Up Recommendations  Outpatient (neuro) OT   Equipment Recommendations  3 in 1 bedside commode    Recommendations for Other Services      Precautions / Restrictions Precautions Precautions: Fall Precaution Comments: peg, incontinent after prostate surgery last month, wife brought briefs Restrictions Weight Bearing Restrictions: No       Mobility Bed Mobility Overal bed mobility: Needs Assistance Bed Mobility: Supine to Sit     Supine to sit: Supervision          Transfers Overall transfer level: Needs assistance Equipment used: Rolling walker (2 wheeled) Transfers: Sit to/from Stand Sit to Stand: Supervision               Balance Overall balance assessment: Needs assistance Sitting-balance support: No upper extremity supported;Feet supported Sitting balance-Leahy Scale: Good     Standing balance support: Bilateral upper extremity supported;No upper extremity supported;During functional activity Standing balance-Leahy Scale: Fair Standing balance comment: fair static standing at sink, UE support for mobility                           ADL either performed or assessed with clinical judgement   ADL Overall ADL's : Needs assistance/impaired     Grooming: Set up;Standing;Oral care Grooming Details (indicate cue type and reason): able to scan to find ADL items to L of sink, no issues opening containers             Lower Body Dressing: Minimal assistance;Sit to/from stand Lower Body Dressing Details (indicate cue type and reason): Min A overall for mgmt of wrap around briefs. pt able to assist in holding up briefs to secure             Functional mobility during ADLs: Supervision/safety;Rolling walker General ADL Comments: Pt with improved ability to scan to L visual field, locate items and maintain balance while looking to L. no physical assist needed for mobility.     Vision   Vision Assessment?: Vision impaired- to be further tested in functional context Additional Comments: WFL, able to scan to L field to locate items well, etc. Improved with use of glasses   Perception     Praxis      Cognition Arousal/Alertness: Awake/alert Behavior During  Therapy: WFL for tasks assessed/performed Overall Cognitive Status: Difficult to assess Area of Impairment: Attention;Problem solving                   Current Attention Level: Selective         Problem Solving: Slow processing;Requires verbal cues General Comments: Spouse reports progressive STM deficits though pt WFL during session; HOH impacting response time and following directions but pt overall  sharp/witty        Exercises     Shoulder Instructions       General Comments      Pertinent Vitals/ Pain       Pain Assessment: No/denies pain  Home Living                                          Prior Functioning/Environment              Frequency  Min 2X/week        Progress Toward Goals  OT Goals(current goals can now be found in the care plan section)  Progress towards OT goals: Progressing toward goals  Acute Rehab OT Goals Patient Stated Goal: home with wife OT Goal Formulation: With patient Time For Goal Achievement: 01/03/21 Potential to Achieve Goals: Fair ADL Goals Pt Will Perform Grooming: with supervision;standing Pt Will Perform Lower Body Bathing: with set-up;sit to/from stand Pt Will Perform Lower Body Dressing: with set-up;sit to/from stand Pt Will Transfer to Toilet: with supervision;ambulating Pt Will Perform Toileting - Clothing Manipulation and hygiene: Independently  Plan Discharge plan remains appropriate;Frequency remains appropriate    Co-evaluation                 AM-PAC OT "6 Clicks" Daily Activity     Outcome Measure   Help from another person eating meals?: A Little Help from another person taking care of personal grooming?: A Little Help from another person toileting, which includes using toliet, bedpan, or urinal?: A Little Help from another person bathing (including washing, rinsing, drying)?: A Little Help from another person to put on and taking off regular upper body clothing?: A Little Help from another person to put on and taking off regular lower body clothing?: A Little 6 Click Score: 18    End of Session Equipment Utilized During Treatment: Rolling walker  OT Visit Diagnosis: Unsteadiness on feet (R26.81);Muscle weakness (generalized) (M62.81);Pain   Activity Tolerance Patient tolerated treatment well   Patient Left in bed;with call bell/phone within reach;with bed alarm set    Nurse Communication Mobility status        Time: 8315-1761 OT Time Calculation (min): 29 min  Charges: OT General Charges $OT Visit: 1 Visit OT Treatments $Self Care/Home Management : 8-22 mins $Therapeutic Activity: 8-22 mins  Malachy Chamber, OTR/L Acute Rehab Services Office: [phone]   Layla Maw 12/25/2020, 9:17 AM

## 2020-12-25 NOTE — Progress Notes (Signed)
Physical Therapy Treatment Patient Details Name: John Walton MRN: [id number] DOB: 30-Nov-1932 Today's Date: 12/25/2020   History of Present Illness Pt is an 87 y/o male admitted 9/19 secondary to dysarthria, visual deficits, and L hand numbness. 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 Pt also found to have R carotid stenosis and is now s/p R carotid endarterectomy on 9/23. PMH includes glaucoma, CKD, DM, HTN, PVD.    PT Comments    Patient progressing well towards PT goals. Improved ambulation distance with supervision for safety and cues for RW proximity. Noted to have decreased foot clearance on left but no tripping occurred. Pt also bumping into things in hallway on right x2 but able to self correct. Decreased gait speed indicating pt is at increased risk for falls. Discussed in length disposition options and problem solving home environment. Would benefit from OPPT neuro for strengthening ,balance and safe mobility. Wife in agreement. Will follow.   Recommendations for follow up therapy are one component of a multi-disciplinary discharge planning process, led by the attending physician.  Recommendations may be updated based on patient status, additional functional criteria and insurance authorization.  Follow Up Recommendations  Outpatient PT;Supervision - Intermittent (neuro)     Equipment Recommendations  None recommended by PT    Recommendations for Other Services       Precautions / Restrictions Precautions Precautions: Fall Precaution Comments: peg, incontinent after prostate surgery last month, wife brought briefs Restrictions Weight Bearing Restrictions: No     Mobility  Bed Mobility Overal bed mobility: Needs Assistance Bed Mobility: Supine to Sit     Supine to sit: Supervision     General bed mobility comments: HOB flat and no rails to simulate home, pillows stacked behind head like at home.    Transfers Overall transfer level:  Needs assistance Equipment used: Rolling walker (2 wheeled) Transfers: Sit to/from Stand Sit to Stand: Supervision         General transfer comment: Supervision for safety. Stood from EOB x3, use of momentum initially to rise. Transferred to chair post ambulation.  Ambulation/Gait Ambulation/Gait assistance: Supervision Gait Distance (Feet): 250 Feet Assistive device: Rolling walker (2 wheeled) Gait Pattern/deviations: Step-through pattern;Decreased stride length;Trunk flexed;Narrow base of support Gait velocity: 1.09 ft/sec Gait velocity interpretation: <1.31 ft/sec, indicative of household ambulator General Gait Details: Slow, mostly steady gait with flexed trunk; cues for RW proximity/management as pt running into things on right side x2 but able to self correct.   Stairs             Wheelchair Mobility    Modified Rankin (Stroke Patients Only)       Balance Overall balance assessment: Needs assistance Sitting-balance support: Feet supported;No upper extremity supported Sitting balance-Leahy Scale: Good     Standing balance support: During functional activity Standing balance-Leahy Scale: Fair Standing balance comment: WI                            Cognition Arousal/Alertness: Awake/alert Behavior During Therapy: WFL for tasks assessed/performed Overall Cognitive Status: Difficult to assess Area of Impairment: Attention;Problem solving;Awareness                   Current Attention Level: Selective       Awareness: Emergent Problem Solving: Slow processing;Requires verbal cues General Comments: Reports having no symptoms and this "was only a mild stroke and I am all better now" despite having weakness.  Exercises      General Comments General comments (skin integrity, edema, etc.): Wife present; discussed in length disposition options and problem solving home environment.      Pertinent Vitals/Pain Pain Assessment:  No/denies pain    Home Living                      Prior Function            PT Goals (current goals can now be found in the care plan section) Acute Rehab PT Goals Patient Stated Goal: home with wife Progress towards PT goals: Progressing toward goals    Frequency    Min 4X/week      PT Plan Discharge plan needs to be updated    Co-evaluation              AM-PAC PT "6 Clicks" Mobility   Outcome Measure  Help needed turning from your back to your side while in a flat bed without using bedrails?: None Help needed moving from lying on your back to sitting on the side of a flat bed without using bedrails?: None Help needed moving to and from a bed to a chair (including a wheelchair)?: A Little Help needed standing up from a chair using your arms (e.g., wheelchair or bedside chair)?: A Little Help needed to walk in hospital room?: A Little Help needed climbing 3-5 steps with a railing? : A Lot 6 Click Score: 19    End of Session Equipment Utilized During Treatment: Gait belt Activity Tolerance: Patient tolerated treatment well Patient left: in chair;with call bell/phone within reach;with chair alarm set;with family/visitor present Nurse Communication: Mobility status PT Visit Diagnosis: Unsteadiness on feet (R26.81);Muscle weakness (generalized) (M62.81)     Time: 6222-9798 PT Time Calculation (min) (ACUTE ONLY): 25 min  Charges:  $Gait Training: 8-22 mins $Self Care/Home Management: 8-22                     Marisa Severin, PT, DPT Acute Rehabilitation Services Pager [phone] Office [phone]      [place] 12/25/2020, 10:52 AM

## 2020-12-25 NOTE — Progress Notes (Addendum)
IRBY FAILS is a 87 y.o. 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 Cardiology was consulted for pre-op evaluation of right CEA on 9/21. Cleared without additional testing.   [facility] will sign off.   Medication Recommendations:  N/A Other recommendations (labs, testing, etc):  N/A - see consult note Follow up as an outpatient:  PRN  Patient seen and examined.  Agree with above.  above.  Day 3 s/p Right CEA.  Doing well post op.  Sinus rhythm in the 70s.  No JVD.  Lungs are clear.  Rhythm is regular without ectopy.  Abdomen soft, no edema.  EF 55 to 60% on echo with apical inferior hypocontractility.  Mild dilation of aortic root at 42 mm and mild dilation of ascending aorta at 41 mm.  Patient is clinically stable; Awaiting CIR bed but according to wife the patient will be discharged tomorrow with outpatient rehab. Will sign off.   Troy Sine, MD, [facility] 12/25/2020 1:28 PM

## 2020-12-25 NOTE — Progress Notes (Signed)
   VASCULAR SURGERY ASSESSMENT & PLAN:   POD 3 S/P RIGHT CEA: Doing well. Awaiting CIR bed.   F/U with [place] has been arranged.    SUBJECTIVE:   No complaints.  PHYSICAL EXAM:   Vitals:   12/24/20 1229 12/24/20 2000 12/24/20 2322 12/25/20 0419  BP: (!) 149/64 139/71 (!) 161/71 (!) 164/73  Pulse: 85 88 90 85  Resp: 17 20 20 18   Temp: 98 F (36.7 C) 98.3 F (36.8 C) 98.3 F (36.8 C) 98 F (36.7 C)  TempSrc: Oral Oral Oral Oral  SpO2: 98% 98% 98% 98%  Weight:      Height:       Neuro intact.  Incision looks good.   LABS:   Lab Results  Component Value Date   WBC 7.3 12/23/2020   HGB 8.4 (L) 12/23/2020   HCT 26.6 (L) 12/23/2020   MCV 89.6 12/23/2020   PLT 314 12/23/2020   Lab Results  Component Value Date   CREATININE 1.81 (H) 12/23/2020   Lab Results  Component Value Date   INR 1.2 12/18/2020   CBG (last 3)  Recent Labs    12/24/20 1236 12/24/20 1635 12/24/20 2005  GLUCAP 183* 172* 176*    PROBLEM LIST:    Principal Problem:   Acute ischemic stroke (HCC) Active Problems:   Essential hypertension   CKD (chronic kidney disease), stage IV (HCC)   Dysphagia   Protein-calorie malnutrition, severe   DM (diabetes mellitus), type 2 (HCC)   S/P TURP (status post transurethral resection of prostate)   ABLA (acute blood loss anemia)   Symptomatic stenosis of right carotid artery   CURRENT MEDS:    acetaminophen  650 mg Oral Once   aspirin EC  81 mg Oral Daily   atorvastatin  40 mg Oral QHS   brinzolamide  1 drop Both Eyes BID   And   brimonidine  1 drop Both Eyes BID   calcium-vitamin D  1 tablet Oral TID with meals   carvedilol  6.25 mg Oral BID WC   collagenase  1 application Topical QODAY   cyanocobalamin  1,000 mcg Subcutaneous Daily   docusate sodium  100 mg Oral Daily   feeding supplement (PROSource TF)  45 mL Per Tube BID   ferrous sulfate  325 mg Oral BID WC   free water  120 mL Per Tube BID   heparin  5,000 Units Subcutaneous Q8H    insulin aspart  0-9 Units Subcutaneous TID WC   latanoprost  1 drop Both Eyes QHS   Lifitegrast  1 drop Both Eyes BID   methimazole  10 mg Oral Q breakfast   pantoprazole  40 mg Oral Daily   polyvinyl alcohol  1 drop Both Eyes TID   sucralfate  1 g Oral TID [employer] Office: [phone] 12/25/2020

## 2020-12-25 NOTE — Progress Notes (Signed)
PROGRESS NOTE        PATIENT DETAILS Name: John Walton Age: 87 y.o. Sex: male Date of Birth: Feb 26, 1933 Admit Date: 12/18/2020 Admitting Physician Etta Quill, DO JJK:[id number], Fransico Him, MD  Brief Narrative: Patient is a 87 y.o. male with history of known right carotid artery stenosis, HTN, HLD, DM-2, CKD stage IV-recent TURP on 9/15-presenting with left eye blurry vision, left hand numbness/weakness and dysarthria.  She was found to have acute CVA and subsequently admitted to the hospitalist service.  See below for further details.    Subjective:  Patient in bed, appears comfortable, denies any headache, no fever, no chest pain or pressure, no shortness of breath , no abdominal pain. No new focal weakness.    Objective: Vitals: Blood pressure 138/75, pulse 94, temperature 98 F (36.7 C), temperature source Oral, resp. rate 16, height 5\' 6"  (1.676 m), weight 65 kg, SpO2 97 %.   Exam:  Awake Alert, No new F.N deficits, Normal affect [place].AT,PERRAL Supple Neck,No JVD, No cervical lymphadenopathy appriciated.  Symmetrical Chest wall movement, Good air movement bilaterally, CTAB RRR,No Gallops, Rubs or new Murmurs, No Parasternal Heave +ve B.Sounds, Abd Soft, No tenderness, PEG in place. No Cyanosis, Clubbing or edema, No new Rash or bruise    Microbiology: 9/19>>Urine Culture: No growth  Stroke work up: 9/19>> CT head: No acute intracranial abnormality. 9/19>> MRI brain: Patchy multifocal acute infarcts involving right frontal/parietal and occipital lobes. 9/20>> A1c: 6.5 9/20>> LDL: 58 9/20>> carotid Doppler: 80-99% stenosis on right, 1-39% on left.   9/20>> TTE: EF 55-60%,+ve regional wall motion abnormalities  Assessment/Plan:  Acute right frontal/parietal and occipital lobe stroke: Likely thromboembolic-due to known right carotid artery stenosis.  Dysarthria/Right sided mild weakness has resolved. No further recommendations from  neurology-patient remains on aspirin and statin. Underwent Right Carotid endarterectomy on 9/23.    Symptomatic right carotid artery stenosis: Vascular surgery following-patient is s/p carotid endarterectomy on 9/23.  Given the fact that he had wall motion abnormalities on echo-cardiology consulted-okay to proceed with surgery.  Acute blood loss anemia (due to hematuria/recent TURP) superimposed on anemia due to chronic disease/renal failure: s/p 2 units of PRBC transfusion-Hb now stable. Have placed on weekly aranesp. Follow Hb  BPH-s/p TURP: Did have hematuria post TURP-this has since resolved.  Spoke with primary urologist-Dr. [name] on 9/20-okay to place patient on antiplatelets-okay to proceed with endarterectomy-and okay for intraoperative heparin.  If significant hematuria reoccurs-recommends we place a Foley catheter.  ?  UTI versus asymptomatic bacteriuria: Recent TURP-UA with significant amount of RBCs/WBCs-no symptoms of UTI-given 1 dose of IV Rocephin-cultures are negative-continue to monitor of antimicrobial therapy.  AKI on CKD stage IV: AKI likely hemodynamically mediated in the setting of hematuria/recent surgery-creatinine improving and close to baseline.  Avoid nephrotoxic agents-follow closely.    DM-2: Diet controlled at home-continue SSI-CBG stable.  Recent Labs    12/24/20 2005 12/25/20 0759 12/25/20 1144  GLUCAP 176* 126* 218*    HTN: BP slightly on the higher side will add low-dose Coreg and monitor.  HLD: On statin  Hyperthyroidism: On methimazole-TSH stable.  History of transient dysphagia due to severe esophageal spasm in the setting of allergy to Robitussin-PEG tube remains in place: Although patient able to consume orally without any issues.  Per spouse-PEG tube is used for occasional nutritional supplements  TTE showing preserved EF but  with positive wall motion abnormality.  Continue aspirin and statin for secondary prevention, will add Coreg if blood  pressure tolerates, chest pain-free, post discharge follow with cardiology outpatient.   Procedures: 9/23>> right CEA  Consults: Neurology, vascular surgery, cardiology DVT Prophylaxis: SQ Heparin Code Status:Full code Family Communication: Spouse Opal Sidles [phone] - 12/25/20  Time spent: 25 minutes-Greater than 50% of this time was spent in counseling, explanation of diagnosis, planning of further management, and coordination of care.  Diet: Diet Order             Diet regular Room service appropriate? Yes with Assist; Fluid consistency: Thin  Diet effective now                     Disposition Plan: Status is: Inpatient  Remains inpatient appropriate because:Inpatient level of care appropriate due to severity of illness  Dispo: The patient is from: Home              Anticipated d/c is to: CIR              Patient currently is medically stable to d/c.   Difficult to place patient No  Barriers to Discharge: Awaiting CIR bed.  Antimicrobial agents: Anti-infectives (From admission, onward)    Start     Dose/Rate Route Frequency Ordered Stop   12/22/20 2000  ceFAZolin (ANCEF) IVPB 2g/100 mL premix        2 g 200 mL/hr over 30 Minutes Intravenous  Once 12/22/20 1544 12/22/20 2214   12/22/20 0800  cefUROXime (ZINACEF) 1.5 g in sodium chloride 0.9 % 100 mL IVPB        1.5 g 200 mL/hr over 30 Minutes Intravenous On call to O.R. 12/21/20 1505 12/23/20 0559   12/22/20 0700  ceFAZolin (ANCEF) IVPB 2g/100 mL premix       Note to Pharmacy: Send with pt to OR   2 g 200 mL/hr over 30 Minutes Intravenous On call 12/21/20 0752 12/22/20 0738   12/18/20 2345  cefTRIAXone (ROCEPHIN) 1 g in sodium chloride 0.9 % 100 mL IVPB        1 g 200 mL/hr over 30 Minutes Intravenous  Once 12/18/20 2343 12/19/20 0120        MEDICATIONS: Scheduled Meds:  acetaminophen  650 mg Oral Once   aspirin EC  81 mg Oral Daily   atorvastatin  40 mg Oral QHS   brinzolamide  1 drop Both Eyes BID    And   brimonidine  1 drop Both Eyes BID   calcium-vitamin D  1 tablet Oral TID with meals   carvedilol  6.25 mg Oral BID WC   collagenase  1 application Topical QODAY   cyanocobalamin  1,000 mcg Subcutaneous Daily   docusate sodium  100 mg Oral Daily   feeding supplement (PROSource TF)  45 mL Per Tube BID   ferrous sulfate  325 mg Oral BID WC   free water  120 mL Per Tube BID   heparin  5,000 Units Subcutaneous Q8H   insulin aspart  0-9 Units Subcutaneous TID WC   latanoprost  1 drop Both Eyes QHS   Lifitegrast  1 drop Both Eyes BID   methimazole  10 mg Oral Q breakfast   pantoprazole  40 mg Oral Daily   polyvinyl alcohol  1 drop Both Eyes TID   sucralfate  1 g Oral TID AC   Continuous Infusions:  sodium chloride     PRN Meds:.sodium chloride, acetaminophen **  OR** acetaminophen (TYLENOL) oral liquid 160 mg/5 mL **OR** acetaminophen, alum & mag hydroxide-simeth, hydrALAZINE, HYDROcodone-acetaminophen, labetalol, metoprolol tartrate, morphine injection, ondansetron, phenol   I have personally reviewed following labs and imaging studies  LABORATORY DATA:  Recent Labs  Lab 12/18/20 1944 12/18/20 1952 12/19/20 0742 12/20/20 0332 12/21/20 0322 12/22/20 0322 12/23/20 0059  WBC 11.4*  --  6.9 5.0 6.0 5.8 7.3  HGB 7.2*   < > 7.6* 7.5* 7.4* 8.9* 8.4*  HCT 23.4*   < > 24.2* 22.6* 22.5* 27.6* 26.6*  PLT 380  --  277 268 305 328 314  MCV 90.3  --  90.6 86.6 87.2 88.2 89.6  MCH 27.8  --  28.5 28.7 28.7 28.4 28.3  MCHC 30.8  --  31.4 33.2 32.9 32.2 31.6  RDW 14.1  --  14.0 13.9 14.1 13.9 13.9  LYMPHSABS 1.0  --   --   --   --   --   --   MONOABS 1.1*  --   --   --   --   --   --   EOSABS 0.1  --   --   --   --   --   --   BASOSABS 0.0  --   --   --   --   --   --    < > = values in this interval not displayed.    Recent Labs  Lab 12/18/20 1944 12/18/20 1952 12/19/20 0742 12/19/20 0803 12/20/20 0332 12/21/20 0322 12/22/20 0322 12/23/20 0059  NA 136   < > 138  --  138  137 137 137  K 4.9   < > 4.1  --  4.2 4.2 4.4 4.5  CL 108   < > 111  --  109 108 107 109  CO2 21*  --  19*  --  20* 21* 23 23  GLUCOSE 160*   < > 89  --  115* 141* 101* 108*  BUN 43*   < > 42*  --  38* 40* 36* 30*  CREATININE 2.45*   < > 2.29*  --  1.92* 1.76* 1.88* 1.81*  CALCIUM 8.4*  --  8.1*  --  8.1* 8.5* 9.1 8.5*  AST 13*  --   --   --   --   --   --   --   ALT 8  --   --   --   --   --   --   --   ALKPHOS 83  --   --   --   --   --   --   --   BILITOT 0.1*  --   --   --   --   --   --   --   ALBUMIN 3.0*  --   --   --  2.2*  --   --   --   INR 1.2  --   --   --   --   --   --   --   TSH  --   --   --  1.259  --   --   --   --   HGBA1C  --   --  6.5*  --   --   --   --   --    < > = values in this interval not displayed.     RADIOLOGY STUDIES/RESULTS: No results found.   LOS: 6 days  Signature  Lala Lund M.D on 12/25/2020 at 11:51 AM   -  To page go to [email or web address]

## 2020-12-26 DIAGNOSIS — I639 Cerebral infarction, unspecified: Secondary | ICD-10-CM | POA: Diagnosis not present

## 2020-12-26 LAB — GLUCOSE, CAPILLARY
Glucose-Capillary: 109 mg/dL — ABNORMAL HIGH (ref 70–99)
Glucose-Capillary: 184 mg/dL — ABNORMAL HIGH (ref 70–99)

## 2020-12-26 MED ORDER — VITAMIN B-12 1000 MCG PO TABS: 1000.0000 ug | ORAL_TABLET | Freq: Every day | ORAL | 0 refills | Status: DC

## 2020-12-26 MED ORDER — CARVEDILOL 3.125 MG PO TABS: 3.1250 mg | ORAL_TABLET | Freq: Two times a day (BID) | ORAL | 0 refills | Status: DC

## 2020-12-26 MED ORDER — PANTOPRAZOLE SODIUM 40 MG PO TBEC: 40.0000 mg | DELAYED_RELEASE_TABLET | Freq: Every day | ORAL | 0 refills | Status: DC

## 2020-12-26 MED ORDER — DOCUSATE SODIUM 100 MG PO CAPS: 100.0000 mg | ORAL_CAPSULE | Freq: Two times a day (BID) | ORAL | 0 refills | Status: AC | PRN

## 2020-12-26 MED ORDER — ACETAMINOPHEN 500 MG PO TABS
1000.0000 mg | ORAL_TABLET | Freq: Once | ORAL | Status: DC
Start: 2020-12-26 — End: 2020-12-26
  Filled 2020-12-26: qty 2

## 2020-12-26 MED ORDER — FERROUS SULFATE 325 (65 FE) MG PO TABS: 325.0000 mg | ORAL_TABLET | Freq: Every day | ORAL | 0 refills | Status: DC

## 2020-12-26 MED ORDER — ASPIRIN EC 81 MG PO TBEC: 81.0000 mg | DELAYED_RELEASE_TABLET | Freq: Every day | ORAL | 0 refills | Status: DC

## 2020-12-26 MED ORDER — HYDRALAZINE HCL 20 MG/ML IJ SOLN: 5.0000 mg | Freq: Four times a day (QID) | INTRAMUSCULAR | Status: DC | PRN

## 2020-12-26 NOTE — Progress Notes (Addendum)
Patient given discharge instructions. PIVs removed. Telemetry box removed, CCMD notified. Patient taken to vehicle in wheelchair by staff.  Kamiryn Bezanson L Kayde Atkerson, RN    

## 2020-12-26 NOTE — Progress Notes (Signed)
Mobility Specialist Progress Note    12/26/20 1101  Mobility  Activity Ambulated in hall  Level of Assistance Modified independent, requires aide device or extra time  Assistive Device Front wheel walker  Distance Ambulated (ft) 342 ft  Mobility Ambulated with assistance in hallway  Mobility Response Tolerated well  Mobility performed by Mobility specialist  Bed Position Chair  $Mobility charge 1 Mobility   Pre-Mobility: 83 HR, 110/68 BP Post-Mobility: 91 HR, 131/57 BP  Pt received in chair and agreeable to ambulation. Took 3 very short standing breaks to take a deep breath while walking but otherwise asymptomatic. Returned to chair with wife and RN present in room.   Hildred Alamin Mobility Specialist  Mobility Specialist Phone: [phone]

## 2020-12-26 NOTE — Progress Notes (Signed)
Physical Therapy Treatment Patient Details Name: John Walton MRN: [id number] DOB: June 03, 1932 Today's Date: 12/26/2020   History of Present Illness Pt is an 87 y/o male admitted 9/19 secondary to dysarthria, visual deficits, and L hand numbness. 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 Pt also found to have R carotid stenosis and is now s/p R carotid endarterectomy on 9/23. PMH includes glaucoma, CKD, DM, HTN, PVD.    PT Comments    Patient progressing well this morning. Pt upset upon arrival due to not having anyone come help him get up and eat breakfast/drink coffee and now it is cold. Requires Min A for bed mobility today but able to stand with supervision and increased time/effort and perform short distance ambulation in room with goal to get to chair to drink coffee. No bumping into things this AM as compared to yesterday. Cognitive seems improved this morning with pt talking in depth about his FDA approved herbal drug for headaches. Pt eager to return home. Per CM, wife now prefers HHPT with eventual transition to OPPT as appropriate. Will follow.   Recommendations for follow up therapy are one component of a multi-disciplinary discharge planning process, led by the attending physician.  Recommendations may be updated based on patient status, additional functional criteria and insurance authorization.  Follow Up Recommendations  Outpatient PT;Supervision - Intermittent     Equipment Recommendations  None recommended by PT    Recommendations for Other Services       Precautions / Restrictions Precautions Precautions: Fall Precaution Comments: peg, incontinent after prostate surgery last month, wife brought briefs Restrictions Weight Bearing Restrictions: No     Mobility  Bed Mobility Overal bed mobility: Needs Assistance Bed Mobility: Supine to Sit     Supine to sit: Min assist;HOB elevated     General bed mobility comments: Assist needed  for trunk to get to EOB, normally has pillows stacked behind head at home.    Transfers Overall transfer level: Needs assistance Equipment used: Rolling walker (2 wheeled) Transfers: Sit to/from Stand Sit to Stand: Supervision         General transfer comment: Supervision for safety. Stood from [employer], use of momentum initially to rise. Transferred to chair.  Ambulation/Gait Ambulation/Gait assistance: Supervision Gait Distance (Feet): 20 Feet Assistive device: Rolling walker (2 wheeled) Gait Pattern/deviations: Step-through pattern;Decreased stride length;Trunk flexed;Narrow base of support   Gait velocity interpretation: <1.31 ft/sec, indicative of household ambulator General Gait Details: Slow, steady gait with flexed trunk; cues for RW proximity. Wanted to get to chair to drink coffee and eat.   Stairs             Wheelchair Mobility    Modified Rankin (Stroke Patients Only) Modified Rankin (Stroke Patients Only) Pre-Morbid Rankin Score: Slight disability Modified Rankin: Moderately severe disability     Balance Overall balance assessment: Needs assistance Sitting-balance support: Feet supported;No upper extremity supported Sitting balance-Leahy Scale: Good     Standing balance support: During functional activity Standing balance-Leahy Scale: Fair Standing balance comment: Able to take hands off RW for short periods but needs UE support for walking/dynamic tasks.                            Cognition Arousal/Alertness: Awake/alert Behavior During Therapy: WFL for tasks assessed/performed Overall Cognitive Status: No family/caregiver present to determine baseline cognitive functioning  General Comments: appears WFL for basic mobility tasks however minimizes stroke symptoms, "i feel normal" despite having some weakness. Able to talk in depth about the herbal drug he created that has passed the FDA and  supposed to publish trial results soon. Irritable this AM as reporting no one helped him get up to get his breakfast or drink his cofrfee and now it is cold.      Exercises      General Comments        Pertinent Vitals/Pain Pain Assessment: No/denies pain    Home Living                      Prior Function            PT Goals (current goals can now be found in the care plan section) Progress towards PT goals: Progressing toward goals    Frequency    Min 4X/week      PT Plan Current plan remains appropriate    Co-evaluation              AM-PAC PT "6 Clicks" Mobility   Outcome Measure  Help needed turning from your back to your side while in a flat bed without using bedrails?: None Help needed moving from lying on your back to sitting on the side of a flat bed without using bedrails?: A Little Help needed moving to and from a bed to a chair (including a wheelchair)?: A Little Help needed standing up from a chair using your arms (e.g., wheelchair or bedside chair)?: A Little Help needed to walk in hospital room?: A Little Help needed climbing 3-5 steps with a railing? : A Lot 6 Click Score: 18    End of Session Equipment Utilized During Treatment: Gait belt Activity Tolerance: Patient tolerated treatment well Patient left: in chair;with call bell/phone within reach;with chair alarm set Nurse Communication: Mobility status PT Visit Diagnosis: Unsteadiness on feet (R26.81);Muscle weakness (generalized) (M62.81)     Time: 0109-3235 PT Time Calculation (min) (ACUTE ONLY): 27 min  Charges:  $Therapeutic Activity: 23-37 mins                     John Walton, PT, DPT Acute Rehabilitation Services Pager [phone] Office [phone]      John Walton 12/26/2020, 9:25 AM

## 2020-12-26 NOTE — TOC Transition Note (Signed)
Transition of Care [facility]) - CM/SW Discharge Note   Patient Details  Name: John Walton MRN: [id number] Date of Birth: 1932-08-21  [facility] CM/SW Contact:  John Bender, RN Phone Number: 12/26/2020, 3:55 PM   Clinical Narrative:    Patient stable for discharge. Home health resumed with [facility]. Notified John Walton with [facility] of discharge.Wife to transport home. Discharge summary will fax to PCP at [facility].   Final next level of care: John Walton Barriers to Discharge: No Barriers Identified   Patient Goals and CMS Choice Patient states their goals for this hospitalization and ongoing recovery are:: return home CMS Medicare.gov Compare Post Acute Care list provided to:: Patient Represenative (must comment) (wife) Choice offered to / list presented to : Patient, Spouse  Discharge Placement             Home with home health          Discharge Plan and Services   Discharge Planning Services: CM Consult Post Acute Care Choice: Home Health, Resumption of Svcs/PTA Provider          DME Arranged: N/A DME Agency: NA       HH Arranged: RN, PT, OT, Nurse's Aide HH Agency: [place] Date HH Agency Contacted: 12/25/20 Time [place]: 68 Representative spoke with at [place]: John Walton ([place]) Interventions     Readmission Risk Interventions Readmission Risk Prevention Plan 12/25/2020  Transportation Screening Complete  Medication Review [employer]) Complete  PCP or Specialist appointment within 3-5 days of discharge Complete  HRI or [place] Complete  SW Recovery Care/Counseling Consult Complete  [place] Not Applicable  Some recent data might be hidden

## 2020-12-26 NOTE — Discharge Summary (Signed)
John Walton OZH:[id number] DOB: 09/12/1932 DOA: 12/18/2020  PCP: [name], MD  Admit date: 12/18/2020  Discharge date: 12/26/2020  Admitted From: Home Disposition:  Home   Recommendations for Outpatient Follow-up:   Follow up with PCP in 1-2 weeks  PCP Please obtain BMP/CBC, 2 view CXR in 1week,  (see Discharge instructions)   PCP Please follow up on the following pending results: Monitor CBC, CMP, Anaemia  Panel, B12, TSh   Home Health: PT- OT, RN   Equipment/Devices: as below  Consultations: Neuro, VVS, Cards Discharge Condition: Stable   CODE STATUS: Full    Diet Recommendation: Heart Healthy - Low Carb  Diet Order             Diet regular Room service appropriate? Yes with Assist; Fluid consistency: Thin  Diet effective now                    Chief Complaint  Patient presents with   Neurologic Problem     Brief history of present illness from the day of admission and additional interim summary    Patient is a 87 y.o. male with history of known right carotid artery stenosis, HTN, HLD, DM-2, CKD stage IV-recent TURP on 9/15-presenting with left eye blurry vision, left hand numbness/weakness and dysarthria.  She was found to have acute CVA and subsequently admitted to the hospitalist service.  See below for further details.     Microbiology: 9/19>>Urine Culture: No growth   Stroke work up: 9/19>> CT head: No acute intracranial abnormality. 9/19>> MRI brain: Patchy multifocal acute infarcts involving right frontal/parietal and occipital lobes. 9/20>> A1c: 6.5 9/20>> LDL: 58 9/20>> carotid Doppler: 80-99% stenosis on right, 1-39% on left.   9/20>> TTE: EF 55-60%,+ve regional wall motion abnormalities                                                                  Hospital  Course    Acute right frontal/parietal and occipital lobe stroke: Likely thromboembolic-due to known right carotid artery stenosis.  Dysarthria/Right sided mild weakness has resolved. No further recommendations from neurology-patient remains on aspirin and statin. Underwent Right Carotid endarterectomy on 9/23, stable and close to baseline, DC home with [facility], PCP and Neuro outpt follow up.     Symptomatic right carotid artery stenosis: Vascular surgery following-patient is s/p carotid endarterectomy on 9/23.  Given the fact that he had wall motion abnormalities on echo-cardiology consulted-okay to proceed with surgery.   Acute blood loss anemia (due to hematuria/recent TURP) superimposed on anemia due to chronic disease/renal failure: s/p 2 units of PRBC transfusion-Hb now stable. Have placed on weekly aranesp. Follow Hb in 1-2 weeks by PCP.   BPH-s/p TURP: Did have hematuria post TURP-this has since resolved.     ?  UTI versus asymptomatic bacteriuria: Recent TURP-UA with significant amount of RBCs/WBCs-no symptoms of UTI-given 1 dose of IV Rocephin-cultures are negative-continue to monitor of antimicrobial therapy.   AKI on CKD stage IV: AKI likely hemodynamically mediated in the setting of hematuria/recent surgery-creatinine improving and close to baseline.     HTN: BP stable on Coreg.   HLD: On statin   Hyperthyroidism: On methimazole-TSH stable.   History of transient dysphagia due to severe esophageal spasm in the setting of allergy to Robitussin-PEG tube remains in place: Although patient able to consume orally without any issues.  Per spouse-PEG tube is used for occasional nutritional supplements   TTE showing preserved EF but with positive wall motion abnormality.  Continue aspirin and statin for secondary prevention, will add Coreg if blood pressure tolerates, chest pain-free, post discharge follow with cardiology outpatient.  DM-2: Diet controlled at home-continue SSI-CBG  stable.  Lab Results  Component Value Date   HGBA1C 6.5 (H) 12/19/2020      Discharge diagnosis     Principal Problem:   Acute ischemic stroke [facility]) Active Problems:   Essential hypertension   CKD (chronic kidney disease), stage IV (HCC)   Dysphagia   Protein-calorie malnutrition, severe   DM (diabetes mellitus), type 2 (HCC)   Weakness   S/P TURP (status post transurethral resection of prostate)   ABLA (acute blood loss anemia)   Symptomatic stenosis of right carotid artery   Stroke-like symptoms    Discharge instructions    Discharge Instructions     Ambulatory referral to Neurology   Complete by: As directed    Follow up with stroke clinic NP (Jessica Vanschaick or Cecille Rubin, if both not available, consider Zachery Dauer, or Ahern) at [facility] in about 4 weeks. Thanks.   Discharge instructions   Complete by: As directed    Follow with Primary MD Tisovec, Fransico Him, MD in 7 days   Get CBC, CMP, B12, anaemia Panel, TSH  -  checked next visit within 1 week by Primary MD   Activity: As tolerated with Full fall precautions use walker/cane & assistance as needed  Disposition Home     Diet: Heart Healthy Low Carb  Special Instructions: If you have smoked or chewed Tobacco  in the last 2 yrs please stop smoking, stop any regular Alcohol  and or any Recreational drug use.  On your next visit with your primary care physician please Get Medicines reviewed and adjusted.  Please request your Prim.MD to go over all Hospital Tests and Procedure/Radiological results at the follow up, please get all Hospital records sent to your Prim MD by signing hospital release before you go home.  If you experience worsening of your admission symptoms, develop shortness of breath, life threatening emergency, suicidal or homicidal thoughts you must seek medical attention immediately by calling 911 or calling your MD immediately  if symptoms less severe.  You Must read complete  instructions/literature along with all the possible adverse reactions/side effects for all the Medicines you take and that have been prescribed to you. Take any new Medicines after you have completely understood and accpet all the possible adverse reactions/side effects.   Discharge wound care:   Complete by: As directed    Keep Neck post op site clean and dry at all times   Increase activity slowly   Complete by: As directed        Discharge Medications   Allergies as of 12/26/2020       Reactions  Other Swelling   ALL COUGH MEDICATIONS   Robitussin Cold Cough+ Chest [dextromethorphan-guaifenesin] Swelling   Laryngeal edema - any cough syrup        Medication List     TAKE these medications    aspirin EC 81 MG tablet Take 1 tablet (81 mg total) by mouth daily.   atorvastatin 40 MG tablet Commonly known as: LIPITOR Place 1 tablet (40 mg total) into feeding tube daily. What changed:  how to take this when to take this   calcium-vitamin D 500-200 MG-UNIT tablet Commonly known as: OSCAL WITH D Place 1 tablet into feeding tube with breakfast, with lunch, and with evening meal. What changed: how to take this   carboxymethylcellulose 0.5 % Soln Commonly known as: REFRESH PLUS Place 1 drop into both eyes in the morning, at noon, and at bedtime.   carvedilol 3.125 MG tablet Commonly known as: COREG Take 1 tablet (3.125 mg total) by mouth 2 (two) times daily with a meal.   collagenase ointment Commonly known as: SANTYL Apply 1 application topically every other day. APPLIED TO HEEL WOUND   docusate sodium 100 MG capsule Commonly known as: Colace Take 1 capsule (100 mg total) by mouth 2 (two) times daily as needed for mild constipation.   ferrous sulfate 325 (65 FE) MG tablet Take 1 tablet (325 mg total) by mouth daily with breakfast.   free water Soln Place 120 mLs into feeding tube 5 (five) times daily. What changed: when to take this   Insulin Pen Needle 29G  X 12.7MM Misc Use as directed   latanoprost 0.005 % ophthalmic solution Commonly known as: XALATAN Place 1 drop into both eyes at bedtime.   methimazole 10 MG tablet Commonly known as: TAPAZOLE Take 10 mg by mouth in the morning.   NORCO PO Take 1 tablet by mouth every 6 (six) hours as needed (pain).   pantoprazole 40 MG tablet Commonly known as: PROTONIX Take 1 tablet (40 mg total) by mouth daily. Start taking on: December 27, 2020   ProSource Liqd Give 30 mLs by tube in the morning and at bedtime.   Simbrinza 1-0.2 % Susp Generic drug: Brinzolamide-Brimonidine Place 1 drop into both eyes in the morning and at bedtime.   sucralfate 1 GM/10ML suspension Commonly known as: CARAFATE Take 10 mLs (1 g total) by mouth 4 (four) times daily -  with meals and at bedtime. What changed: when to take this   Tears Again Oint Place 1 application into both eyes in the morning and at bedtime.   vitamin B-12 1000 MCG tablet Commonly known as: CYANOCOBALAMIN Take 1 tablet (1,000 mcg total) by mouth daily.   Xiidra 5 % Soln Generic drug: Lifitegrast Place 1 drop into both eyes in the morning and at bedtime.               Discharge Care Instructions  (From admission, onward)           Start     Ordered   12/26/20 0000  Discharge wound care:       Comments: Keep Neck post op site clean and dry at all times   12/26/20 0840             Follow-up Information     [facility]. Schedule an appointment as soon as possible for a visit in 1 month(s).   Specialty: Neurology Why: stroke clinic Contact information: [place] [phone]  [facility] Follow up in 2 week(s).   Why: The office will call the patient with an appointment Contact information: [place]        Jerline Pain, MD. Schedule an appointment as  soon as possible for a visit in 1 week(s).   Specialty: Cardiology Contact information: [address] [place] [address] [phone]         Tisovec, Fransico Him, MD. Schedule an appointment as soon as possible for a visit in 1 week(s).   Specialty: Internal Medicine Contact information: [place] [address] [place], [place] Follow up.   Specialty: Home Health Services Why: resume [place] services (RN/PT/PT/aide)- they will contact you to schedule visit within 48hr post discharge [facility] will f/u on timing to transition to outpt neuro rehab) Contact information: [place] [address] [phone]                 Major procedures and Radiology Reports - PLEASE review detailed and final reports thoroughly  -        CT HEAD WO CONTRAST  Result Date: 12/18/2020 CLINICAL DATA:  TIA. EXAM: CT HEAD WITHOUT CONTRAST TECHNIQUE: Contiguous axial images were obtained from the base of the skull through the vertex without intravenous contrast. COMPARISON:  CT head 03/07/2020. FINDINGS: Brain: No evidence of acute infarction, hemorrhage, hydrocephalus, extra-axial collection or mass lesion/mass effect. There is stable mild diffuse atrophy. There is also stable mild periventricular white matter hypodensity, likely chronic small vessel ischemic change. 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 Vascular: Atherosclerotic calcifications are present within the cavernous internal carotid arteries. Skull: Normal. Negative for fracture or focal lesion. Sinuses/Orbits: No acute finding. Other: None. IMPRESSION: No acute intracranial abnormality. Electronically Signed   By: Ronney Asters M.D.   On: 12/18/2020 21:12   MR ANGIO HEAD WO CONTRAST  Result Date: 12/19/2020 CLINICAL DATA:  Stroke, follow-up EXAM: MRA HEAD WITHOUT CONTRAST TECHNIQUE: Angiographic images of the Circle  of Willis were acquired using MRA technique without intravenous contrast. COMPARISON:  No pertinent prior exam. FINDINGS: Intracranial internal carotid arteries are patent. Middle and anterior cerebral arteries are patent. Intracranial vertebral arteries, basilar artery, posterior cerebral arteries are patent. Bilateral posterior communicating arteries are present. There is no significant stenosis or aneurysm. IMPRESSION: Normal MRA head. Electronically Signed   By: Macy Mis M.D.   On: 12/19/2020 12:32   MR BRAIN WO CONTRAST  Result Date: 12/19/2020 CLINICAL DATA:  Initial evaluation for neuro deficit, stroke suspected. EXAM: MRI HEAD WITHOUT CONTRAST TECHNIQUE: Multiplanar, multiecho pulse sequences of the brain and surrounding structures were obtained without intravenous contrast. COMPARISON:  CT from earlier the same day. FINDINGS: Brain: Generalized age-related cerebral atrophy. Patchy and confluent T2/FLAIR hyperintensity involving the periventricular deep white matter both cerebral hemispheres as well as the pons, most consistent with chronic small vessel ischemic disease moderate in nature. Encephalomalacia and gliosis involving the anterior right frontal region consistent with a chronic right MCA distribution infarct. Patchy multifocal areas of restricted diffusion seen involving the cortical gray matter of the right frontal, parietal, and occipital lobes, consistent with acute ischemic infarcts. These measure up to approximately 1 cm in size, and are somewhat watershed in distribution. No associated hemorrhage or mass effect. No other evidence for acute or subacute ischemia. Gray-white matter differentiation  otherwise maintained. Few scattered additional chronic micro hemorrhages noted involving the right greater than left cerebral hemispheres, likely small vessel/hypertensive in nature. No mass lesion, midline shift or mass effect. No hydrocephalus or extra-axial fluid collection. Pituitary gland  suprasellar region within normal limits. Midline structures intact. Vascular: Major intracranial vascular flow voids are grossly maintained. Skull and upper cervical spine: Craniocervical junction within normal limits. Bone marrow signal intensity normal. No scalp soft tissue abnormality. Sinuses/Orbits: Prior bilateral ocular lens replacement. Mild scattered mucosal thickening noted within the ethmoidal air cells. Paranasal sinuses are otherwise clear. No significant mastoid effusion. Inner ear structures grossly normal. Other: None. IMPRESSION: 1. Patchy multifocal acute ischemic infarcts involving the cortical gray matter of the right frontal, parietal, and occipital lobes as above. No associated hemorrhage or mass effect. 2. 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 3. Underlying moderate chronic microvascular ischemic disease. Electronically Signed   By: Jeannine Boga M.D.   On: 12/19/2020 00:18   ECHOCARDIOGRAM COMPLETE  Result Date: 12/19/2020    ECHOCARDIOGRAM REPORT   Patient Name:   BRYNDON CUMBIE Date of Exam: 12/19/2020 Medical Rec #:  [id number]       Height:       66.0 in Accession #:    [id number]      Weight:       143.3 lb Date of Birth:  1932-04-13       BSA:          1.736 m Patient Age:    32 years        BP:           126/60 mmHg Patient Gender: M               HR:           57 bpm. Exam Location:  Inpatient Procedure: 2D Echo, Cardiac Doppler and Color Doppler Indications:     Stroke  History:         Patient has prior history of Echocardiogram examinations, most                  recent 03/08/2020. Abnormal ECG, Arrythmias:RBBB; Risk                  Factors:Hypertension and Dyslipidemia.  Sonographer:     Roseanna Rainbow RDCS Referring Phys:  [id number] [place] Diagnosing Phys: Fransico Him MD IMPRESSIONS  1. Left ventricular ejection fraction, by estimation, is 55 to 60%. The left ventricle has normal function. The left ventricle demonstrates regional wall  motion abnormalities (see scoring diagram/findings for description). Left ventricular diastolic parameters were normal. There is hypokinesis of the left ventricular, apical inferior wall.  2. Right ventricular systolic function is normal. The right ventricular size is normal. Tricuspid regurgitation signal is inadequate for assessing PA pressure.  3. The mitral valve is normal in structure. Trivial mitral valve regurgitation. No evidence of mitral stenosis.  4. The aortic valve is calcified. There is severe calcifcation of the aortic valve. There is severe thickening of the aortic valve. Aortic valve regurgitation is trivial. Mild to moderate aortic valve stenosis. Aortic valve area, by VTI measures 1.93 cm. Aortic valve mean gradient measures 16.0 mmHg. Aortic valve Vmax measures 2.49 m/s.  5. Aortic dilatation noted. There is mild dilatation of the aortic root, measuring 42 mm. There is mild dilatation of the ascending aorta, measuring 41 mm. FINDINGS  Left Ventricle: Left ventricular ejection fraction, by estimation, is 55 to 60%.  The left ventricle has normal function. The left ventricle demonstrates regional wall motion abnormalities. The left ventricular internal cavity size was normal in size. There is no left ventricular hypertrophy. Left ventricular diastolic parameters were normal. Normal left ventricular filling pressure. Right Ventricle: The right ventricular size is normal. No increase in right ventricular wall thickness. Right ventricular systolic function is normal. Tricuspid regurgitation signal is inadequate for assessing PA pressure. Left Atrium: Left atrial size was normal in size. Right Atrium: Right atrial size was normal in size. Pericardium: There is no evidence of pericardial effusion. Mitral Valve: The mitral valve is normal in structure. Trivial mitral valve regurgitation. No evidence of mitral valve stenosis. MV peak gradient, 9.1 mmHg. The mean mitral valve gradient is 3.5 mmHg.  Tricuspid Valve: The tricuspid valve is normal in structure. Tricuspid valve regurgitation is trivial. No evidence of tricuspid stenosis. Aortic Valve: The aortic valve is calcified. There is severe calcifcation of the aortic valve. There is severe thickening of the aortic valve. Aortic valve regurgitation is trivial. Mild to moderate aortic stenosis is present. Aortic valve mean gradient measures 16.0 mmHg. Aortic valve peak gradient measures 24.8 mmHg. Aortic valve area, by VTI measures 1.93 cm. Pulmonic Valve: The pulmonic valve was normal in structure. Pulmonic valve regurgitation is mild. No evidence of pulmonic stenosis. Aorta: Aortic dilatation noted. There is mild dilatation of the aortic root, measuring 42 mm. There is mild dilatation of the ascending aorta, measuring 41 mm. Venous: The inferior vena cava was not well visualized. IAS/Shunts: No atrial level shunt detected by color flow Doppler.  LEFT VENTRICLE PLAX 2D LVIDd:         4.60 cm     Diastology LVIDs:         3.30 cm     LV e' medial:    8.49 cm/s LV PW:         1.00 cm     LV E/e' medial:  13.1 LV IVS:        1.00 cm     LV e' lateral:   9.79 cm/s LVOT diam:     2.30 cm     LV E/e' lateral: 11.3 LV SV:         106 LV SV Index:   61 LVOT Area:     4.15 cm  LV Volumes (MOD) LV vol d, MOD A2C: 63.4 ml LV vol d, MOD A4C: 89.9 ml LV vol s, MOD A2C: 41.3 ml LV vol s, MOD A4C: 44.5 ml LV SV MOD A2C:     22.1 ml LV SV MOD A4C:     89.9 ml LV SV MOD BP:      35.4 ml RIGHT VENTRICLE             IVC RV S prime:     11.10 cm/s  IVC diam: 1.70 cm TAPSE (M-mode): 2.5 cm LEFT ATRIUM             Index       RIGHT ATRIUM           Index LA diam:        3.30 cm 1.90 cm/m  RA Area:     12.00 cm LA Vol (A2C):   46.5 ml 26.79 ml/m RA Volume:   27.50 ml  15.84 ml/m LA Vol (A4C):   26.9 ml 15.50 ml/m LA Biplane Vol: 34.3 ml 19.76 ml/m  AORTIC VALVE  PULMONIC VALVE AV Area (Vmax):    2.12 cm     PR End Diast Vel: 1.42 msec AV Area (Vmean):    1.82 cm AV Area (VTI):     1.93 cm AV Vmax:           249.00 cm/s AV Vmean:          189.000 cm/s AV VTI:            0.550 m AV Peak Grad:      24.8 mmHg AV Mean Grad:      16.0 mmHg LVOT Vmax:         127.00 cm/s LVOT Vmean:        82.600 cm/s LVOT VTI:          0.255 m LVOT/AV VTI ratio: 0.46  AORTA Ao Root diam: 4.20 cm Ao Asc diam:  4.10 cm MITRAL VALVE MV Area (PHT): 4.49 cm     SHUNTS MV Area VTI:   3.41 cm     Systemic VTI:  0.26 m MV Peak grad:  9.1 mmHg     Systemic Diam: 2.30 cm MV Mean grad:  3.5 mmHg MV Vmax:       1.51 m/s MV Vmean:      84.4 cm/s MV Decel Time: 169 msec MV E velocity: 111.00 cm/s MV A velocity: 133.00 cm/s MV E/A ratio:  0.83 Fransico Him MD Electronically signed by Fransico Him MD Signature Date/Time: 12/19/2020/11:22:31 AM    Final (Updated)    VAS US CAROTID  Result Date: 12/21/2020 Carotid Arterial Duplex Study Patient Name:  UGONNA KEIRSEY  Date of Exam:   12/19/2020 Medical Rec #: [id number]        Accession #:    [id number] Date of Birth: 04-06-1932        Patient Gender: M Patient Age:   39 years Exam [facility]  [facility] Procedure:      VAS US CAROTID Referring Phys: Lesleigh [name] --------------------------------------------------------------------------------  Indications:      CVA. Risk Factors:     Hypertension, hyperlipidemia, Diabetes, coronary artery                   disease. Comparison Study: 09/27/20 Performing Technologist: Archie Patten RVS  Examination Guidelines: A complete evaluation includes B-mode imaging, spectral Doppler, color Doppler, and power Doppler as needed of all accessible portions of each vessel. Bilateral testing is considered an integral part of a complete examination. Limited examinations for reoccurring indications may be performed as noted.  Right Carotid Findings: +---------+--------+-------+--------+---------------------------------+--------+          PSV cm/sEDV    StenosisPlaque Description               Comments                   cm/s                                                     +---------+--------+-------+--------+---------------------------------+--------+ CCA Prox 72      13             heterogenous                              +---------+--------+-------+--------+---------------------------------+--------+ CCA      63  14             heterogenous                              Distal                                                                    +---------+--------+-------+--------+---------------------------------+--------+ ICA Prox 428     122    80-99%  heterogenous, calcific and                                                irregular                                 +---------+--------+-------+--------+---------------------------------+--------+ ICA Mid  327     72                                                       +---------+--------+-------+--------+---------------------------------+--------+ ICA      152     39                                                       Distal                                                                    +---------+--------+-------+--------+---------------------------------+--------+ ECA      114                                                              +---------+--------+-------+--------+---------------------------------+--------+ +----------+--------+-------+--------+-------------------+           PSV cm/sEDV cmsDescribeArm Pressure (mmHG) +----------+--------+-------+--------+-------------------+ [id number]                                        +----------+--------+-------+--------+-------------------+ +---------+--------+--+--------+--+---------+ VertebralPSV cm/s56EDV cm/s17Antegrade +---------+--------+--+--------+--+---------+  Left Carotid Findings: +----------+--------+--------+--------+------------------+--------+           PSV cm/sEDV cm/sStenosisPlaque  DescriptionComments +----------+--------+--------+--------+------------------+--------+ CCA Prox  112     17              heterogenous               +----------+--------+--------+--------+------------------+--------+ CCA  [id number]     18              heterogenous               +----------+--------+--------+--------+------------------+--------+ ICA Prox  120     24      1-39%   heterogenous               +----------+--------+--------+--------+------------------+--------+ ICA Distal74      21                                         +----------+--------+--------+--------+------------------+--------+ ECA       119                                                +----------+--------+--------+--------+------------------+--------+ +----------+--------+--------+--------+-------------------+           PSV cm/sEDV cm/sDescribeArm Pressure (mmHG) +----------+--------+--------+--------+-------------------+ [id number]                                          +----------+--------+--------+--------+-------------------+ +---------+--------+--+--------+--+---------+ VertebralPSV cm/s58EDV cm/s19Antegrade +---------+--------+--+--------+--+---------+   Summary: Right Carotid: Velocities in the right ICA are consistent with a 80-99%                stenosis. Left Carotid: Velocities in the left ICA are consistent with a 1-39% stenosis. Vertebrals: Bilateral vertebral arteries demonstrate antegrade flow. *See table(s) above for measurements and observations.  Electronically signed by Antony Contras MD on 12/21/2020 at 9:30:41 AM.    Final      Today   Subjective    John Walton today has no headache,no chest abdominal pain,no new weakness tingling or numbness, feels much better wants to go home today.     Objective   Blood pressure (!) 145/45, pulse 83, temperature 98.7 F (37.1 C), temperature source Oral, resp. rate 18, height 5\' 6"  (1.676 m), weight 65 kg, SpO2 98  %.   Intake/Output Summary (Last 24 hours) at 12/26/2020 0846 Last data filed at 12/26/2020 0448 Gross per 24 hour  Intake --  Output 900 ml  Net -900 ml    Exam  Awake Alert, No new F.N deficits, Normal affect [place].AT,PERRAL Supple Neck,No JVD, No cervical lymphadenopathy appriciated.  Symmetrical Chest wall movement, Good air movement bilaterally, CTAB RRR,No Gallops,Rubs or new Murmurs, No Parasternal Heave +ve B.Sounds, Abd Soft, Non tender, No organomegaly appriciated, No rebound -guarding or rigidity. No Cyanosis, R. Neck scar is clean   Data Review   CBC w Diff:  Lab Results  Component Value Date   WBC 7.3 12/23/2020   HGB 8.4 (L) 12/23/2020   HCT 26.6 (L) 12/23/2020   PLT 314 12/23/2020   LYMPHOPCT 8 12/18/2020   MONOPCT 10 12/18/2020   EOSPCT 1 12/18/2020   BASOPCT 0 12/18/2020    CMP:  Lab Results  Component Value Date   NA 137 12/23/2020   K 4.5 12/23/2020   CL 109 12/23/2020   CO2 23 12/23/2020   BUN 30 (H) 12/23/2020   CREATININE 1.81 (H) 12/23/2020   PROT 6.9 12/18/2020   ALBUMIN 2.2 (L) 12/20/2020   BILITOT 0.1 (L) 12/18/2020   ALKPHOS 83 12/18/2020   AST 13 (  L) 12/18/2020   ALT 8 12/18/2020  . Lab Results  Component Value Date   HGBA1C 6.5 (H) 12/19/2020    Lab Results  Component Value Date   CHOL 114 12/19/2020   HDL 36 (L) 12/19/2020   LDLCALC 58 12/19/2020   LDLDIRECT 137.0 07/11/2014   TRIG 99 12/19/2020   CHOLHDL 3.2 12/19/2020     Total Time in preparing paper work, data evaluation and todays exam - 65 minutes  Lala Lund M.D on 12/26/2020 at 8:46 AM  [facility]

## 2021-01-11 ENCOUNTER — Ambulatory Visit (INDEPENDENT_AMBULATORY_CARE_PROVIDER_SITE_OTHER): Payer: No Typology Code available for payment source | Admitting: Family

## 2021-01-11 ENCOUNTER — Ambulatory Visit (INDEPENDENT_AMBULATORY_CARE_PROVIDER_SITE_OTHER): Payer: Medicare Other | Admitting: Physician Assistant

## 2021-01-11 ENCOUNTER — Encounter (HOSPITAL_BASED_OUTPATIENT_CLINIC_OR_DEPARTMENT_OTHER): Payer: Self-pay | Admitting: Family

## 2021-01-11 ENCOUNTER — Other Ambulatory Visit: Payer: Self-pay

## 2021-01-11 VITALS — BP 122/58 | HR 85 | Ht 66.0 in | Wt 145.0 lb

## 2021-01-11 VITALS — BP 143/73 | HR 85 | Temp 97.7°F | Resp 18 | Ht 66.0 in | Wt 145.0 lb

## 2021-01-11 DIAGNOSIS — Z8673 Personal history of transient ischemic attack (TIA), and cerebral infarction without residual deficits: Secondary | ICD-10-CM

## 2021-01-11 DIAGNOSIS — I10 Essential (primary) hypertension: Secondary | ICD-10-CM | POA: Diagnosis not present

## 2021-01-11 DIAGNOSIS — I350 Nonrheumatic aortic (valve) stenosis: Secondary | ICD-10-CM

## 2021-01-11 DIAGNOSIS — I6521 Occlusion and stenosis of right carotid artery: Secondary | ICD-10-CM

## 2021-01-11 DIAGNOSIS — I452 Bifascicular block: Secondary | ICD-10-CM | POA: Diagnosis not present

## 2021-01-11 DIAGNOSIS — Z9889 Other specified postprocedural states: Secondary | ICD-10-CM

## 2021-01-11 NOTE — Progress Notes (Signed)
Office Visit    Patient Name: John Walton Date of Encounter: 01/11/2021  PCP:  [name], MD   [place]  Cardiologist:  Larae Grooms, MD  Advanced Practice Provider:  No care team member to display Electrophysiologist:  None   Chief Complaint    John Walton is a 87 y.o. male with a hx of hypertension, hyperlipidemia, DM2, CKD IV, migraine, hypothyroidism, chronic anemia, CVA, bifascicular heart block, carotid artery disease s/p right CEA  presents today for hospital follow up   Past Medical History    Past Medical History:  Diagnosis Date   Anemia    Chicken pox    Chronic kidney disease    Coronary artery disease    Diabetes mellitus without complication ([place])    Frequent headaches    Glaucoma    Hay fever    History of blood transfusion    HTN (hypertension)    Hyperlipidemia    Hyperlipidemia    Hypothyroidism    Migraines    Peripheral vascular disease ([place])    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[facility])    Past Surgical History:  Procedure Laterality Date   CHOLECYSTECTOMY, LAPAROSCOPIC     ENDARTERECTOMY Right 12/22/2020   Procedure: RIGHT CAROTID ENDARTERECTOMY;  Surgeon: Angelia Mould, MD;  Location: [place];  Service: Vascular;  Laterality: Right;   IR GASTROSTOMY TUBE MOD SED  03/29/2020   TONSILLECTOMY  04/01/1938   TOTAL HIP ARTHROPLASTY Left 03/08/2020   Procedure: TOTAL HIP ARTHROPLASTY;  Surgeon: Marybelle Killings, MD;  Location: [facility];  Service: Orthopedics;  Laterality: Left;   TRANSURETHRAL RESECTION OF PROSTATE N/A 12/14/2020   Procedure: TRANSURETHRAL RESECTION OF THE PROSTATE (TURP);  Surgeon: Franchot Gallo, MD;  Location: [facility];  Service: Urology;  Laterality: N/A;  2 HRS    Allergies  Allergies  Allergen Reactions   Other Swelling    ALL COUGH MEDICATIONS   Robitussin Cold Cough+ Chest [Dextromethorphan-Guaifenesin] Swelling    Laryngeal edema - any cough syrup    History of  Present Illness    John Walton is a 87 y.o. male with a hx of hypertension, hyperlipidemia, DM2, CKD IV, migraine, hypothyroidism, chronic anemia, CVA, bifascicular block, carotid artery disease s/p right CEA last seen while hospitalized.  He was admitted for right vision changes and left arm weakness and numbness found of acute CVA.  He was seen by vascular surgery during admission for symptomatic right carotid stenosis greater than 80% and right carotid endarterectomy was performed.  Echo 12/20/2018 LVEF 55 to 60%, hypokinesis of LV, apical inferior wall, trivial MR, trivial AR, mild to moderate AS with aortic valve area, by VTI measures 1.93 cm. Aortic valve mean gradient measures 16.0 mmHg. Aortic valve Vmax measures 2.49 m/s. mild dilatation of the aortic root 42 mm, mild dilatation of the ascending aorta 41 mm.   He presents today for follow up with his wife. He reports feeling overall well. Reports only mild incisional soreness.  He is working with physical therapy at home. Still with some limits to his right arm mobility. Weakness markedly improved. Reports no shortness of breath at rest and only very mild dyspnea on exertion. Reports no chest pain, pressure, or tightness. No edema, orthopnea, PND. Reports no palpitations.    EKGs/Labs/Other Studies Reviewed:   The following studies were reviewed today:  Echo from 12/19/20:   1. Left ventricular ejection fraction, by estimation, is 55 to 60%. The  left ventricle  has normal function. The left ventricle demonstrates  regional wall motion abnormalities (see scoring diagram/findings for  description). Left ventricular diastolic  parameters were normal. There is hypokinesis of the left ventricular,  apical inferior wall.   2. Right ventricular systolic function is normal. The right ventricular  size is normal. Tricuspid regurgitation signal is inadequate for assessing  PA pressure.   3. The mitral valve is normal in structure. Trivial  mitral valve  regurgitation. No evidence of mitral stenosis.   4. The aortic valve is calcified. There is severe calcifcation of the  aortic valve. There is severe thickening of the aortic valve. Aortic valve  regurgitation is trivial. Mild to moderate aortic valve stenosis. Aortic  valve area, by VTI measures 1.93  cm. Aortic valve mean gradient measures 16.0 mmHg. Aortic valve Vmax  measures 2.49 m/s.   5. Aortic dilatation noted. There is mild dilatation of the aortic root,  measuring 42 mm. There is mild dilatation of the ascending aorta,  measuring 41 mm.     EKG:  No EKG today  Recent Labs: 04/04/2020: Magnesium 2.0 12/18/2020: ALT 8 12/19/2020: TSH 1.259 12/23/2020: BUN 30; Creatinine, Ser 1.81; Hemoglobin 8.4; Platelets 314; Potassium 4.5; Sodium 137  Recent Lipid Panel    Component Value Date/Time   CHOL 114 12/19/2020 0742   TRIG 99 12/19/2020 0742   HDL 36 (L) 12/19/2020 0742   CHOLHDL 3.2 12/19/2020 0742   VLDL 20 12/19/2020 0742   LDLCALC 58 12/19/2020 0742   LDLDIRECT 137.0 07/11/2014 0850   Home Medications   Current Meds  Medication Sig   aspirin EC 81 MG tablet Take 1 tablet (81 mg total) by mouth daily.   atorvastatin (LIPITOR) 40 MG tablet Place 1 tablet (40 mg total) into feeding tube daily. (Patient taking differently: Take 40 mg by mouth at bedtime.)   calcium-vitamin D (OSCAL WITH D) 500-200 MG-UNIT tablet Place 1 tablet into feeding tube with breakfast, with lunch, and with evening meal. (Patient taking differently: Take 1 tablet by mouth with breakfast, with lunch, and with evening meal.)   carboxymethylcellulose (REFRESH PLUS) 0.5 % SOLN Place 1 drop into both eyes in the morning, at noon, and at bedtime.   carvedilol (COREG) 3.125 MG tablet Take 1 tablet (3.125 mg total) by mouth 2 (two) times daily with a meal.   collagenase (SANTYL) ointment Apply 1 application topically every other day. APPLIED TO HEEL WOUND   docusate sodium (COLACE) 100 MG capsule  Take 1 capsule (100 mg total) by mouth 2 (two) times daily as needed for mild constipation.   ferrous sulfate 325 (65 FE) MG tablet Take 1 tablet (325 mg total) by mouth daily with breakfast.   HYDROcodone-Acetaminophen (NORCO PO) Take 1 tablet by mouth every 6 (six) hours as needed (pain).   Insulin Pen Needle 29G X 12.7MM MISC Use as directed   latanoprost (XALATAN) 0.005 % ophthalmic solution Place 1 drop into both eyes at bedtime.    Lifitegrast (XIIDRA) 5 % SOLN Place 1 drop into both eyes in the morning and at bedtime.   methimazole (TAPAZOLE) 10 MG tablet Take 10 mg by mouth in the morning.   Nutritional Supplements (PROSOURCE) LIQD Give 30 mLs by tube in the morning and at bedtime.   pantoprazole (PROTONIX) 40 MG tablet Take 1 tablet by mouth daily.   SIMBRINZA 1-0.2 % SUSP Place 1 drop into both eyes in the morning and at bedtime.    sucralfate (CARAFATE) 1 GM/10ML suspension Take 10  mLs (1 g total) by mouth 4 (four) times daily -  with meals and at bedtime. (Patient taking differently: Take 1 g by mouth 3 (three) times daily before meals.)   vitamin B-12 (CYANOCOBALAMIN) 500 MCG tablet Take 1 tablet by mouth daily.   Water For Irrigation, Sterile (FREE WATER) SOLN Place 120 mLs into feeding tube 5 (five) times daily. (Patient taking differently: Place 120 mLs into feeding tube in the morning and at bedtime.)   White Petrolatum-Mineral Oil (TEARS AGAIN) OINT Place 1 application into both eyes in the morning and at bedtime.   [DISCONTINUED] pantoprazole (PROTONIX) 40 MG tablet Take 1 tablet (40 mg total) by mouth daily.   [DISCONTINUED] vitamin B-12 (CYANOCOBALAMIN) 1000 MCG tablet Take 1 tablet (1,000 mcg total) by mouth daily.     Review of Systems      All other systems reviewed and are otherwise negative except as noted above.  Physical Exam    VS:  BP (!) 122/58   Pulse 85   Ht 5\' 6"  (1.676 m)   Wt 145 lb (65.8 kg)   SpO2 96%   BMI 23.40 kg/m  , BMI Body mass index is 23.4  kg/m.  Wt Readings from Last 3 Encounters:  01/11/21 145 lb (65.8 kg)  12/18/20 143 lb 4.8 oz (65 kg)  12/14/20 142 lb 8 oz (64.6 kg)    GEN: Well nourished, well developed, in no acute distress. HEENT: normal. Neck: Supple, no JVD, carotid bruits, or masses. Cardiac: RRR, no murmurs, rubs, or gallops. No clubbing, cyanosis, edema.  Radials/PT 2+ and equal bilaterally.  Respiratory:  Respirations regular and unlabored, clear to auscultation bilaterally. GI: Soft, nontender, nondistended. MS: No deformity or atrophy. Skin: Warm and dry, no rash. Neuro:  Strength and sensation are intact. Psych: Normal affect.  Assessment & Plan   HTN - BP well controlled. Continue current antihypertensive regimen.    Hyperlipidemia - 12/19/20 LDL 58. Continue Atorvastatin 40mg  QD. Denies myalgias.   DM2 - Continue to follow with PCP. 12/19/20 A1c 6.5. Continue to follow with PCP.   CKD IV - Careful titration of diuretic and antihypertensive.    Hypothyroidism - Continue to follow with PCP.   Mild to moderate aortic stenosis - No chest pain, dyspnea, syncope. Repeat echo in 1 year for monitoring ordered today. Continue optimal Bp control.   Anemia - Likely postoperative. No melena, hematuria. Recommend continued dietary intake of iron.   History of CVA - mobility improving with HH PT. Continue optimal BP and lipid control. Continue Aspirin, statin. Continue to follow with neurology.   Bifascicular heart block - Continue to monitor with periodic EKG. No lightheadedness, dizziness, near syncope, syncope.   Carotid artery disease s/p right CEA - Incision healing well. Upcoming follow up with vascular surgery this afternoon. Continue Aspirin, Statin.   Disposition: Follow up in 4 month(s) with Dr. Irish Lack or APP.  Signed, Loel Dubonnet, NP 01/11/2021, 11:11 AM [place]

## 2021-01-11 NOTE — Progress Notes (Signed)
POST OPERATIVE OFFICE NOTE    CC:  F/u for surgery  HPI:  This is a 87 y.o. male who is s/p right carotid endarterectomy by Dr. Scot Dock on 12/22/20 for symptomatic high grade stenosis. Patient had a right brain stroke initially presenting with LUE weakness and dysarthria. He was found to have >80% right ICA stenosis. He tolerated the surgery well and had no new neurological events post operatively. He was discharged home on Aspirin and statin post op day 4.   Since surgery he says he has been doing great. He has had no pain. His neck incision is healing well. He is having PT 2 x/week. He says he is slowly getting back to being able to do all his ADLs. He is able to stand on his own and ambulate with walker short distances. He denies any vision changes, slurred speech, facial drooping, weakness of arms or legs. He is compliant with his Aspirin and Statin. He was seen by Cardiology earlier today and they have stopped his BB due to hypotension.  Allergies  Allergen Reactions   Other Swelling    ALL COUGH MEDICATIONS   Robitussin Cold Cough+ Chest [Dextromethorphan-Guaifenesin] Swelling    Laryngeal edema - any cough syrup    Current Outpatient Medications  Medication Sig Dispense Refill   aspirin EC 81 MG tablet Take 1 tablet (81 mg total) by mouth daily. 30 tablet 0   atorvastatin (LIPITOR) 40 MG tablet Place 1 tablet (40 mg total) into feeding tube daily. (Patient taking differently: Take 40 mg by mouth at bedtime.) 30 tablet 0   calcium-vitamin D (OSCAL WITH D) 500-200 MG-UNIT tablet Place 1 tablet into feeding tube with breakfast, with lunch, and with evening meal. (Patient taking differently: Take 1 tablet by mouth with breakfast, with lunch, and with evening meal.) 90 tablet 0   carboxymethylcellulose (REFRESH PLUS) 0.5 % SOLN Place 1 drop into both eyes in the morning, at noon, and at bedtime.     collagenase (SANTYL) ointment Apply 1 application topically every other day. APPLIED TO  HEEL WOUND     docusate sodium (COLACE) 100 MG capsule Take 1 capsule (100 mg total) by mouth 2 (two) times daily as needed for mild constipation. 30 capsule 0   ferrous sulfate 325 (65 FE) MG tablet Take 1 tablet (325 mg total) by mouth daily with breakfast. 30 tablet 0   Insulin Pen Needle 29G X 12.7MM MISC Use as directed 100 each 0   latanoprost (XALATAN) 0.005 % ophthalmic solution Place 1 drop into both eyes at bedtime.      Lifitegrast (XIIDRA) 5 % SOLN Place 1 drop into both eyes in the morning and at bedtime.     methimazole (TAPAZOLE) 10 MG tablet Take 10 mg by mouth in the morning.     Nutritional Supplements (PROSOURCE) LIQD Give 30 mLs by tube in the morning and at bedtime.     pantoprazole (PROTONIX) 40 MG tablet Take 1 tablet by mouth daily.     SIMBRINZA 1-0.2 % SUSP Place 1 drop into both eyes in the morning and at bedtime.      sucralfate (CARAFATE) 1 GM/10ML suspension Take 10 mLs (1 g total) by mouth 4 (four) times daily -  with meals and at bedtime. (Patient taking differently: Take 1 g by mouth 3 (three) times daily before meals.) 420 mL 0   vitamin B-12 (CYANOCOBALAMIN) 500 MCG tablet Take 1 tablet by mouth daily.     Water For Irrigation, Sterile (  FREE WATER) SOLN Place 120 mLs into feeding tube 5 (five) times daily. (Patient taking differently: Place 120 mLs into feeding tube in the morning and at bedtime.) 1000 mL 15   White Petrolatum-Mineral Oil (TEARS AGAIN) OINT Place 1 application into both eyes in the morning and at bedtime.     HYDROcodone-Acetaminophen (NORCO PO) Take 1 tablet by mouth every 6 (six) hours as needed (pain). (Patient not taking: Reported on 01/11/2021)     No current facility-administered medications for this visit.     ROS:  See HPI  Physical Exam:  Vitals:   01/11/21 1538 01/11/21 1541  BP: 128/63 (!) 143/73  Pulse: 85 85  Resp: 18   Temp: 97.7 F (36.5 C)   TempSrc: Temporal   SpO2: 99%   Weight: 145 lb (65.8 kg)   Height: 5\' 6"   (1.676 m)     General: well appearing, very pleasant, not in any discomfort Cardiac:regular Lungs: non labored Incision:  right neck incision is healing very well, Dermabond still present on parts of incision Extremities:  well perfused and warm. Moving all extremities without deficits Neuro: CN intact. Alert and oriented. Speech coherent   Assessment/Plan:  This is a 87 y.o. male who is s/p right carotid endarterectomy by Dr. Scot Dock on 12/22/20 for symptomatic high grade stenosis. Doing excellent post operatively. Incision is healing well. He is progressing with his therapies. He has had no new neurological symptoms.  - continue Aspirin and statin - He will follow up in 9 months with Carotid Duplex   Karoline Caldwell, PA-C [facility] [phone]  On call MD:  Dr. Virl Cagey

## 2021-01-11 NOTE — Patient Instructions (Addendum)
Medication Instructions:  As your blood pressure is well controlled, we will  STOP Carvedilol (Coreg)  If your blood pressure at home is consistently more than 135 for the top number at rest please call and let us know.   *If you need a refill on your cardiac medications before your next appointment, please call your pharmacy*   Lab Work: None ordered today   Testing/Procedures: Your echocardiogram in the hospital shows your aortic valve is mild to moderately stiff. We will plan to repeat the echocardiogram in one year for monitoring.   If you develop chest pain, passing out, or worsening dyspnea this could be a sign of worsening aortic stenosis and we would want you to call and let us know.   Follow-Up: At [facility], you and your health needs are our priority.  As part of our continuing mission to provide you with exceptional heart care, we have created designated Provider Care Teams.  These Care Teams include your primary Cardiologist (physician) and Advanced Practice Providers (APPs -  Physician Assistants and Nurse Practitioners) who all work together to provide you with the care you need, when you need it.  We recommend signing up for the patient portal called "MyChart".  Sign up information is provided on this After Visit Summary.  MyChart is used to connect with patients for Virtual Visits (Telemedicine).  Patients are able to view lab/test results, encounter notes, upcoming appointments, etc.  Non-urgent messages can be sent to your provider as well.   To learn more about what you can do with MyChart, go to [email or web address].    Your next appointment:   3-4 month(s)  The format for your next appointment:   In Person  Provider:   You may see Larae Grooms, MD or one of the following Advanced Practice Providers on your designated Care Team:   Melina Copa, PA-C Ermalinda Barrios, PA-C Loel Dubonnet, NP   Other Instructions  Heart Healthy Diet  Recommendations: A low-salt diet is recommended. Meats should be grilled, baked, or boiled. Avoid fried foods. Focus on lean protein sources like fish or chicken with vegetables and fruits. The American Heart Association is a Microbiologist!  Exercise recommendations: The American Heart Association recommends 150 minutes of moderate intensity exercise weekly. Try 30 minutes of moderate intensity exercise 4-5 times per week. This could include walking, jogging, or swimming.   Keep up the good work with physical therapy!  Tips to Measure your Blood Pressure Correctly  Here's what you can do to ensure a correct reading:  Don't drink a caffeinated beverage or smoke during the 30 minutes before the test.  Sit quietly for five minutes before the test begins.  During the measurement, sit in a chair with your feet on the floor and your arm supported so your elbow is at about heart level.  The inflatable part of the cuff should completely cover at least 80% of your upper arm, and the cuff should be placed on bare skin, not over a shirt.  Don't talk during the measurement.  Have your blood pressure measured twice, with a brief break in between. If the readings are different by 5 points or more, have it done a third time.  In 2017, new guidelines from the [employer], the [employer] of Cardiology, and nine other health organizations lowered the diagnosis of high blood pressure to 130/80 mm Hg or higher for all adults. The guidelines also redefined the various blood pressure categories to now include  normal, elevated, Stage 1 hypertension, Stage 2 hypertension, and hypertensive crisis (see "Blood pressure categories").  Blood pressure categories  Blood pressure category SYSTOLIC (upper number)  DIASTOLIC (lower number)  Normal Less than 120 mm Hg and Less than 80 mm Hg  Elevated 120-129 mm Hg and Less than 80 mm Hg  High blood pressure: Stage 1 hypertension 130-139 mm Hg or 80-89  mm Hg  High blood pressure: Stage 2 hypertension 140 mm Hg or higher or 90 mm Hg or higher  Hypertensive crisis (consult your doctor immediately) Higher than 180 mm Hg and/or Higher than 120 mm Hg  Source: American Heart Association and American Stroke Association. For more on getting your blood pressure under control, buy Controlling Your Blood Pressure, a Special Health Report from [facility].

## 2021-01-17 ENCOUNTER — Other Ambulatory Visit: Payer: Self-pay

## 2021-01-17 DIAGNOSIS — I6521 Occlusion and stenosis of right carotid artery: Secondary | ICD-10-CM

## 2021-02-13 ENCOUNTER — Encounter: Payer: Self-pay | Admitting: Adult Health

## 2021-02-13 ENCOUNTER — Ambulatory Visit (INDEPENDENT_AMBULATORY_CARE_PROVIDER_SITE_OTHER): Payer: Medicare Other | Admitting: Adult Health

## 2021-02-13 VITALS — BP 147/73 | HR 87 | Ht 66.0 in | Wt 147.0 lb

## 2021-02-13 DIAGNOSIS — E785 Hyperlipidemia, unspecified: Secondary | ICD-10-CM | POA: Diagnosis not present

## 2021-02-13 DIAGNOSIS — I10 Essential (primary) hypertension: Secondary | ICD-10-CM | POA: Diagnosis not present

## 2021-02-13 DIAGNOSIS — R269 Unspecified abnormalities of gait and mobility: Secondary | ICD-10-CM

## 2021-02-13 DIAGNOSIS — I63031 Cerebral infarction due to thrombosis of right carotid artery: Secondary | ICD-10-CM | POA: Diagnosis not present

## 2021-02-13 NOTE — Patient Instructions (Addendum)
Continue aspirin 81 mg daily  and atorvastatin 40 mg daily for secondary stroke prevention  Continue to follow up with PCP regarding cholesterol, blood pressure and diabetes management  Maintain strict control of hypertension with blood pressure goal below 130/90, diabetes with hemoglobin A1c goal below 7.0% and cholesterol with LDL cholesterol (bad cholesterol) goal below 70 mg/dL.   Continue home health therapies - once completed, please let me know if you would like to participate in additional outpatient therapies     Followup in the future with me in 4 months or call earlier if needed       Thank you for coming to see [place] at [facility]. I hope we have been able to provide you high quality care today.  You may receive a patient satisfaction survey over the next few weeks. We would appreciate your feedback and comments so that we may continue to improve ourselves and the health of our patients.   Stroke Prevention Some medical conditions and lifestyle choices can lead to a higher risk for a stroke. You can help to prevent a stroke by eating healthy foods and exercising. It also helps to not smoke and to manage any health problems you may have. How can this condition affect me? A stroke is an emergency. It should be treated right away. A stroke can lead to brain damage or threaten your life. There is a better chance of surviving and getting better after a stroke if you get medical help right away. What can increase my risk? The following medical conditions may increase your risk of a stroke: Diseases of the heart and blood vessels (cardiovascular disease). High blood pressure (hypertension). Diabetes. High cholesterol. Sickle cell disease. Problems with blood clotting. Being very overweight. Sleeping problems (obstructivesleep apnea). Other risk factors include: Being older than age 15. A history of blood clots, stroke, or mini-stroke (TIA). Race, ethnic  background, or a family history of stroke. Smoking or using tobacco products. Taking birth control pills, especially if you smoke. Heavy alcohol and drug use. Not being active. What actions can I take to prevent this? Manage your health conditions High cholesterol. Eat a healthy diet. If this is not enough to manage your cholesterol, you may need to take medicines. Take medicines as told by your doctor. High blood pressure. Try to keep your blood pressure below 130/80. If your blood pressure cannot be managed through a healthy diet and regular exercise, you may need to take medicines. Take medicines as told by your doctor. Ask your doctor if you should check your blood pressure at home. Have your blood pressure checked every year. Diabetes. Eat a healthy diet and get regular exercise. If your blood sugar (glucose) cannot be managed through diet and exercise, you may need to take medicines. Take medicines as told by your doctor. Talk to your doctor about getting checked for sleeping problems. Signs of a problem can include: Snoring a lot. Feeling very tired. Make sure that you manage any other conditions you have. Nutrition  Follow instructions from your doctor about what to eat or drink. You may be told to: Eat and drink fewer calories each day. Limit how much salt (sodium) you use to 1,500 milligrams (mg) each day. Use only healthy fats for cooking, such as olive oil, canola oil, and sunflower oil. Eat healthy foods. To do this: Choose foods that are high in fiber. These include whole grains, and fresh fruits and vegetables. Eat at least 5 servings of fruits and  vegetables a day. Try to fill one-half of your plate with fruits and vegetables at each meal. Choose low-fat (lean) proteins. These include low-fat cuts of meat, chicken without skin, fish, tofu, beans, and nuts. Eat low-fat dairy products. Avoid foods that: Are high in salt. Have saturated fat. Have trans fat. Have  cholesterol. Are processed or pre-made. Count how many carbohydrates you eat and drink each day. Lifestyle If you drink alcohol: Limit how much you have to: 0-1 drink a day for women who are not pregnant. 0-2 drinks a day for men. Know how much alcohol is in your drink. In the [place] one drink equals one 12 oz bottle of beer (330mL), one 5 oz glass of wine (182mL), or one 1 oz glass of hard liquor (63mL). Do not smoke or use any products that have nicotine or tobacco. If you need help quitting, ask your doctor. Avoid secondhand smoke. Do not use drugs. Activity  Try to stay at a healthy weight. Get at least 30 minutes of exercise on most days, such as: Fast walking. Biking. Swimming. Medicines Take over-the-counter and prescription medicines only as told by your doctor. Avoid taking birth control pills. Talk to your doctor about the risks of taking birth control pills if: You are over 33 years old. You smoke. You get very bad headaches. You have had a blood clot. Where to find more information American Stroke Association: www.strokeassociation.org Get help right away if: You or a loved one has any signs of a stroke. "BE FAST" is an easy way to remember the warning signs: B - Balance. Dizziness, sudden trouble walking, or loss of balance. E - Eyes. Trouble seeing or a change in how you see. F - Face. Sudden weakness or loss of feeling of the face. The face or eyelid may droop on one side. A - Arms. Weakness or loss of feeling in an arm. This happens all of a sudden and most often on one side of the body. S - Speech. Sudden trouble speaking, slurred speech, or trouble understanding what people say. T - Time. Time to call emergency services. Write down what time symptoms started. You or a loved one has other signs of a stroke, such as: A sudden, very bad headache with no known cause. Feeling like you may vomit (nausea). Vomiting. A seizure. These symptoms may be an emergency.  Get help right away. Call your local emergency services (911 in the [place] Do not wait to see if the symptoms will go away. Do not drive yourself to the hospital. Summary You can help to prevent a stroke by eating healthy, exercising, and not smoking. It also helps to manage any health problems you have. Do not smoke or use any products that contain nicotine or tobacco. Get help right away if you or a loved one has any signs of a stroke. This information is not intended to replace advice given to you by your health care provider. Make sure you discuss any questions you have with your health care provider. Document Revised: 10/18/2019 Document Reviewed: 10/18/2019 Elsevier Patient Education  [place].

## 2021-02-13 NOTE — Progress Notes (Signed)
[facility] [address] [place]. [place] [address] [phone]       HOSPITAL FOLLOW UP NOTE  John Walton Date of Birth:  Jun 10, 1932 Medical Record Number:  [id number]   Reason for Referral:  hospital stroke follow up    SUBJECTIVE:   CHIEF COMPLAINT:  Chief Complaint  Patient presents with   Follow-up    RM 3 with spouse John Walton Pt is well and stable no concerns from him, wife states his strength is improving but his memory has declined    HPI:   John Walton is a 87 year old male with history of hypertension, hyperlipidemia, diabetes, CAD, migraine, PVD, CKD, hypothyroidism, baseline anemia, TURP 9/15 with postop hematuria, right carotid stenosis 60 to 79% in 08/2020 who presented on 12/18/2020 for right sided vision changes and left arm weakness and numbness.  Personally reviewed hospitalization pertinent progress notes, lab work and imaging.  Evaluated by Dr. Erlinda Hong.  CT no acute abnormality.  MRI showed right MCA scattered infarcts. MRA head normal.  Carotid Doppler right ICA 80 to 99% stenosis.  EF 55 to 60%, MRA negative.  LDL 58, A1c 6.5.  WBC 11.4, however, hemoglobin 7.2-> 7.1 with baseline around 8.5, received 1 unit PRBC transfusion.  Etiology for stroke likely due to right high-grade ICA stenosis.  Unable to complete CTA head/neck due to elevated creatinine in setting of CKD. VVS consult Dr. Scot Dock - s/p R CEA 9/23.  Given severe anemia, recommended aspirin 325 mg daily monotherapy and continuation of atorvastatin 40 mg daily. PT/OT initially recommended CIR but as he progressed with therapy recommended PT/OT ([place] requested by wife) and discharged home on 12/26/2020   Today, 02/13/2021, John Walton is being seen for initial hospital follow-up accompanied by his wife.   Currently working with [facility] PT/OT 2x/week - he reports he has been doing well but per wife, not as strong since stroke (generalized although greatly improving), memory slightly worsened  post stroke and increased fatigue.  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 Denies new stroke/TIA symptoms  Compliant on aspirin and atorvastatin -denies side effects Blood pressure today 147/73  F/u with [facility] 10/13 - plan to repeat carotid duplex in 9 months F/u with cardiology 10/13 - no changes in  No further concerns at this time     PERTINENT IMAGING  CT head No acute abnormality.  MRI  1. Patchy multifocal acute ischemic infarcts involving the cortical gray matter of the right frontal, parietal, and occipital lobes as above. No associated hemorrhage or mass effect. 2. 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 3. Underlying moderate chronic microvascular ischemic disease. MRA head  normal  2D Echo EF 55 to 60% Caroid [place]; RICA with 80-99% stenosis. LICA 9-24% stenosis LDL 58 HgbA1c 6.5    ROS:   14 system review of systems performed and negative with exception of those listed in HPI  PMH:  Past Medical History:  Diagnosis Date   Anemia    Chicken pox    Chronic  kidney disease    Coronary artery disease    Diabetes mellitus without complication (HCC)    Frequent headaches    Glaucoma    Hay fever    History of blood transfusion    HTN (hypertension)    Hyperlipidemia    Hyperlipidemia    Hypothyroidism    Migraines    Peripheral vascular disease ([place])    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([place])     PSH:  Past Surgical History:  Procedure Laterality  Date   CHOLECYSTECTOMY, LAPAROSCOPIC     ENDARTERECTOMY Right 12/22/2020   Procedure: RIGHT CAROTID ENDARTERECTOMY;  Surgeon: Angelia Mould, MD;  Location: [facility] OR;  Service: Vascular;  Laterality: Right;   IR GASTROSTOMY TUBE MOD SED  03/29/2020   TONSILLECTOMY  04/01/1940   TOTAL HIP ARTHROPLASTY Left 03/08/2020   Procedure: TOTAL HIP ARTHROPLASTY;  Surgeon: Marybelle Killings, MD;  Location: [facility] ORS;  Service: Orthopedics;  Laterality: Left;   TRANSURETHRAL RESECTION OF PROSTATE N/A 12/14/2020   Procedure: TRANSURETHRAL RESECTION OF THE PROSTATE (TURP);  Surgeon: Franchot Gallo, MD;  Location: [facility] ORS;  Service: Urology;  Laterality: N/A;  2 HRS    Social History:  Social History   Socioeconomic History   Marital status: Married    Spouse name: Not on file   Number of children: Not on file   Years of education: Not on file   Highest education level: Not on file  Occupational History   Not on file  Tobacco Use   Smoking status: Former    Types: Cigarettes    Start date: 04/01/1968    Quit date: 01/11/1977    Years since quitting: 44.1   Smokeless tobacco: Never  Vaping Use   Vaping Use: Never used  Substance and Sexual Activity   Alcohol use: Not Currently   Drug use: No   Sexual activity: Never  Other Topics Concern   Not on file  Social History Narrative   Not on file   Social Determinants of Health   Financial Resource Strain: Not on file  Food Insecurity: Not on file  Transportation Needs: Not on file  Physical Activity: Not on file  Stress: Not on file  Social Connections: Not on file  Intimate Partner Violence: Not on file    Family History:  Family History  Problem Relation Age of Onset   Liver disease Mother    Skin cancer Father    Skin cancer Paternal Grandfather    Diabetes Maternal Grandfather     Medications:   Current Outpatient Medications on File Prior to Visit  Medication Sig Dispense Refill   aspirin EC 81 MG tablet Take 1 tablet (81  mg total) by mouth daily. 30 tablet 0   atorvastatin (LIPITOR) 40 MG tablet Place 1 tablet (40 mg total) into feeding tube daily. (Patient taking differently: Take 40 mg by mouth at bedtime.) 30 tablet 0   calcium-vitamin D (OSCAL WITH D) 500-200 MG-UNIT tablet Place 1 tablet into feeding tube with breakfast, with lunch, and with evening meal. (Patient taking differently: Take 1 tablet by mouth with breakfast, with lunch, and with evening meal.) 90 tablet 0   carboxymethylcellulose (REFRESH PLUS) 0.5 % SOLN Place 1 drop into both eyes in the morning, at noon, and at bedtime.     collagenase (SANTYL) ointment Apply 1 application topically every other day. APPLIED TO HEEL WOUND     ferrous sulfate 325 (65 FE) MG tablet Take 1 tablet (325 mg total) by mouth  daily with breakfast. 30 tablet 0   HYDROcodone-Acetaminophen (NORCO PO) Take 1 tablet by mouth every 6 (six) hours as needed (pain).     latanoprost (XALATAN) 0.005 % ophthalmic solution Place 1 drop into both eyes at bedtime.      Lifitegrast (XIIDRA) 5 % SOLN Place 1 drop into both eyes in the morning and at bedtime.     methimazole (TAPAZOLE) 10 MG tablet Take 10 mg by mouth in the morning.     Nutritional Supplements (PROSOURCE) LIQD Give 30 mLs by tube in the morning and at bedtime.     pantoprazole (PROTONIX) 40 MG tablet Take 1 tablet by mouth daily.     SIMBRINZA 1-0.2 % SUSP Place 1 drop into both eyes in the morning and at bedtime.      sucralfate (CARAFATE) 1 GM/10ML suspension Take 10 mLs (1 g total) by mouth 4 (four) times daily -  with meals and at bedtime. (Patient taking differently: Take 1 g by mouth 3 (three) times daily before meals.) 420 mL 0   vitamin B-12 (CYANOCOBALAMIN) 500 MCG tablet Take 1 tablet by mouth daily.     Water For Irrigation, Sterile (FREE WATER) SOLN Place 120 mLs into feeding tube 5 (five) times daily. (Patient taking differently: Place 120 mLs into feeding tube in the morning and at bedtime.) 1000 mL 15    White Petrolatum-Mineral Oil (TEARS AGAIN) OINT Place 1 application into both eyes in the morning and at bedtime.     No current facility-administered medications on file prior to visit.    Allergies:   Allergies  Allergen Reactions   Other Swelling    ALL COUGH MEDICATIONS   Robitussin Cold Cough+ Chest [Dextromethorphan-Guaifenesin] Swelling    Laryngeal edema - any cough syrup      OBJECTIVE:  Physical Exam  Vitals:   02/13/21 0916  BP: (!) 147/73  Pulse: 87  Weight: 147 lb (66.7 kg)  Height: 5\' 6"  (1.676 m)   Body mass index is 23.73 kg/m. No results found.  Post stroke PHQ 2/9 Depression screen PHQ 2/9 02/13/2021  Decreased Interest 0  Down, Depressed, Hopeless 0  PHQ - 2 Score 0     General: well developed, well nourished, very pleasant elderly Caucasian male, seated, in no evident distress Head: head normocephalic and atraumatic.   Neck: supple with no carotid or supraclavicular bruits Cardiovascular: regular rate and rhythm, no murmurs Musculoskeletal: no deformity Skin:  no rash/petichiae Vascular:  Normal pulses all extremities   Neurologic Exam Mental Status: Awake and fully alert.  Fluent speech and language.  Oriented to place and time. Recent memory mildly impaired and remote memory intact. Attention span, concentration and fund of knowledge appropriate during visit. Mood and affect appropriate.  Cranial Nerves: Fundoscopic exam reveals sharp disc margins. Pupils equal, briskly reactive to light. Extraocular movements full without nystagmus. Visual fields full to confrontation.  HOH bilaterally. Facial sensation intact. Face, tongue, palate moves normally and symmetrically.  Motor: Full strength in all tested extremities except slight weakness RUE d/t hx of shoulder injury and slight decreased left hand grip weakness and finger dexterity Sensory.: intact to touch , pinprick , position and vibratory sensation.  Coordination: Rapid alternating movements  normal in all extremities except slightly decreased left hand. Finger-to-nose and heel-to-shin performed accurately bilaterally. Gait and Station: Arises from chair without difficulty. Stance is normal. Gait demonstrates short shuffled steps with use of Rollator walker.  Tandem walk and heel toe not attempted.  Reflexes: 1+  and symmetric. Toes downgoing.     NIHSS  0 Modified Rankin  2      ASSESSMENT: John Walton is a 87 y.o. year old male with recent right MCA scattered infarcts on 12/17/2020 in setting of high-grade right ICA stenosis s/p R CEA 9/23. Vascular risk factors include carotid stenosis, HTN, HLD, DM, CAD, migraines, PVD and advanced age.      PLAN:  Right MCA strokes:  Residual deficit: mild weakness/gait impairment and slight worsening of baseline short term memory loss.  Continue working with [facility] therapies. May need to possibly consider pursuing outpatient therapies once completed Continue aspirin 81 mg daily  and atorvastatin 40 mg daily for secondary stroke prevention.   Discussed secondary stroke prevention measures and importance of close PCP follow up for aggressive stroke risk factor management. I have gone over the pathophysiology of stroke, warning signs and symptoms, risk factors and their management in some detail with instructions to go to the closest emergency room for symptoms of concern. Carotid stenosis: s/p R CEA 9/23 by Dr. Scot Dock. Plans on repeat carotid duplex 9 months postprocedure HTN: BP goal <130/90.  Stable on current regimen per PCP HLD: LDL goal <70. Recent LDL 58 on atorvastatin 40 mg daily.  DMII: A1c goal<7.0. Recent A1c 6.5.  Monitored by PCP on nonpharmacological management    Follow up in 4 months or call earlier if needed   CC:  [facility] provider: Dr. Leonie Man PCP: Osborne Casco, Fransico Him, MD    I spent 58 minutes of face-to-face and non-face-to-face time with patient and wife.  This included previsit chart review including review of recent  hospitalization, lab review, study review, electronic health record documentation, patient education regarding recent stroke including etiology, secondary stroke prevention measures and importance of managing stroke risk factors, residual deficits and typical recovery time and answered all other questions to patient and wife's satisfaction  Frann Rider, AGNP-BC  [facility] [address] [place], [place] [address]  Phone [phone] Fax [phone] Note: This document was prepared with digital dictation and possible smart phrase technology. Any transcriptional errors that result from this process are unintentional.

## 2021-02-23 ENCOUNTER — Emergency Department (HOSPITAL_BASED_OUTPATIENT_CLINIC_OR_DEPARTMENT_OTHER): Payer: No Typology Code available for payment source

## 2021-02-23 ENCOUNTER — Emergency Department (HOSPITAL_BASED_OUTPATIENT_CLINIC_OR_DEPARTMENT_OTHER)
Admission: EM | Admit: 2021-02-23 | Discharge: 2021-02-23 | Disposition: A | Discharge status: Home / Self Care | Payer: No Typology Code available for payment source | Attending: Emergency Medicine | Admitting: Emergency Medicine

## 2021-02-23 ENCOUNTER — Other Ambulatory Visit: Payer: Self-pay

## 2021-02-23 ENCOUNTER — Encounter (HOSPITAL_BASED_OUTPATIENT_CLINIC_OR_DEPARTMENT_OTHER): Payer: Self-pay | Admitting: *Deleted

## 2021-02-23 DIAGNOSIS — N184 Chronic kidney disease, stage 4 (severe): Secondary | ICD-10-CM | POA: Insufficient documentation

## 2021-02-23 DIAGNOSIS — Z87891 Personal history of nicotine dependence: Secondary | ICD-10-CM | POA: Insufficient documentation

## 2021-02-23 DIAGNOSIS — I129 Hypertensive chronic kidney disease with stage 1 through stage 4 chronic kidney disease, or unspecified chronic kidney disease: Secondary | ICD-10-CM | POA: Diagnosis not present

## 2021-02-23 DIAGNOSIS — E039 Hypothyroidism, unspecified: Secondary | ICD-10-CM | POA: Insufficient documentation

## 2021-02-23 DIAGNOSIS — E119 Type 2 diabetes mellitus without complications: Secondary | ICD-10-CM | POA: Insufficient documentation

## 2021-02-23 DIAGNOSIS — N3001 Acute cystitis with hematuria: Secondary | ICD-10-CM | POA: Insufficient documentation

## 2021-02-23 DIAGNOSIS — Z96642 Presence of left artificial hip joint: Secondary | ICD-10-CM | POA: Insufficient documentation

## 2021-02-23 DIAGNOSIS — I959 Hypotension, unspecified: Secondary | ICD-10-CM | POA: Diagnosis not present

## 2021-02-23 DIAGNOSIS — R031 Nonspecific low blood-pressure reading: Secondary | ICD-10-CM

## 2021-02-23 DIAGNOSIS — I2510 Atherosclerotic heart disease of native coronary artery without angina pectoris: Secondary | ICD-10-CM | POA: Insufficient documentation

## 2021-02-23 DIAGNOSIS — Z7982 Long term (current) use of aspirin: Secondary | ICD-10-CM | POA: Insufficient documentation

## 2021-02-23 LAB — LACTIC ACID, PLASMA
Lactic Acid, Venous: 1.8 mmol/L (ref 0.5–1.9)
Lactic Acid, Venous: 1.5 mmol/L (ref 0.5–1.9)

## 2021-02-23 LAB — CBC WITH DIFFERENTIAL/PLATELET
Abs Immature Granulocytes: 0.03 10*3/uL (ref 0.00–0.07)
Basophils Absolute: 0 10*3/uL (ref 0.0–0.1)
Basophils Relative: 0 %
Eosinophils Absolute: 0.3 10*3/uL (ref 0.0–0.5)
Eosinophils Relative: 4 %
HCT: 32 % — ABNORMAL LOW (ref 39.0–52.0)
Hemoglobin: 10.1 g/dL — ABNORMAL LOW (ref 13.0–17.0)
Immature Granulocytes: 0 %
Lymphocytes Relative: 13 %
Lymphs Abs: 0.9 10*3/uL (ref 0.7–4.0)
MCH: 28.1 pg (ref 26.0–34.0)
MCHC: 31.6 g/dL (ref 30.0–36.0)
MCV: 88.9 fL (ref 80.0–100.0)
Monocytes Absolute: 1 10*3/uL (ref 0.1–1.0)
Monocytes Relative: 14 %
Neutro Abs: 5 10*3/uL (ref 1.7–7.7)
Neutrophils Relative %: 69 %
Platelets: 197 10*3/uL (ref 150–400)
RBC: 3.6 MIL/uL — ABNORMAL LOW (ref 4.22–5.81)
RDW: 13.9 % (ref 11.5–15.5)
WBC: 7.2 10*3/uL (ref 4.0–10.5)
nRBC: 0 % (ref 0.0–0.2)

## 2021-02-23 LAB — TROPONIN I (HIGH SENSITIVITY)
Troponin I (High Sensitivity): 5 ng/L (ref <18)
Troponin I (High Sensitivity): 5 ng/L (ref <18)

## 2021-02-23 LAB — URINALYSIS, ROUTINE W REFLEX MICROSCOPIC
Bilirubin Urine: NEGATIVE
Glucose, UA: NEGATIVE mg/dL
Ketones, ur: NEGATIVE mg/dL
Nitrite: NEGATIVE
Protein, ur: 30 mg/dL — AB
Specific Gravity, Urine: 1.01 (ref 1.005–1.030)
pH: 6 (ref 5.0–8.0)

## 2021-02-23 LAB — COMPREHENSIVE METABOLIC PANEL
ALT: 16 U/L (ref 0–44)
AST: 17 U/L (ref 15–41)
Albumin: 3.4 g/dL — ABNORMAL LOW (ref 3.5–5.0)
Alkaline Phosphatase: 85 U/L (ref 38–126)
Anion gap: 8 (ref 5–15)
BUN: 44 mg/dL — ABNORMAL HIGH (ref 8–23)
CO2: 22 mmol/L (ref 22–32)
Calcium: 8.7 mg/dL — ABNORMAL LOW (ref 8.9–10.3)
Chloride: 103 mmol/L (ref 98–111)
Creatinine, Ser: 2.4 mg/dL — ABNORMAL HIGH (ref 0.61–1.24)
GFR, Estimated: 26 mL/min — ABNORMAL LOW (ref >60)
Glucose, Bld: 187 mg/dL — ABNORMAL HIGH (ref 70–99)
Potassium: 4.3 mmol/L (ref 3.5–5.1)
Sodium: 133 mmol/L — ABNORMAL LOW (ref 135–145)
Total Bilirubin: 0.2 mg/dL — ABNORMAL LOW (ref 0.3–1.2)
Total Protein: 7 g/dL (ref 6.5–8.1)

## 2021-02-23 LAB — URINALYSIS, MICROSCOPIC (REFLEX)

## 2021-02-23 MED ORDER — CEPHALEXIN 500 MG PO CAPS: 500.0000 mg | ORAL_CAPSULE | Freq: Two times a day (BID) | ORAL | 0 refills | Status: AC

## 2021-02-23 MED ORDER — SODIUM CHLORIDE 0.9 % IV BOLUS
1000.0000 mL | Freq: Once | INTRAVENOUS | Status: AC
  Administered 2021-02-23: 1000 mL via INTRAVENOUS

## 2021-02-23 MED ORDER — SODIUM CHLORIDE 0.9 % IV SOLN
1.0000 g | Freq: Once | INTRAVENOUS | Status: AC
  Administered 2021-02-23: 1 g via INTRAVENOUS
  Filled 2021-02-23: qty 10

## 2021-02-23 NOTE — ED Notes (Signed)
Pt d/c home per MD order. Discharge summary reviewed, verbalize understanding. Off unit via WC. No s/s of acute distress noted. Discharged home with wife and son.

## 2021-02-23 NOTE — Discharge Instructions (Addendum)
Your laboratory results were within normal limits today.   Your urinalysis did show some signs of infection.  This was also sent for culture.  I have provided a prescription for antibiotics to help treat this infection, please take 1 tablet twice a day for the next 7 days.  Please schedule an appointment with your primary care physician in the upcoming week to recheck your symptoms.   Please follow-up with your primary care physician in the upcoming week for a recheck in symptoms.

## 2021-02-23 NOTE — ED Triage Notes (Signed)
Wife states she took his BP this am and was 86/50. BP noted normal at triage. Wife states he was given a narcotic for wound pain a few hours before his BP was taken.

## 2021-02-23 NOTE — ED Provider Notes (Signed)
[facility] Provider Note   CSN: [id number] Arrival date & time: 02/23/21  1159     History Chief Complaint  Patient presents with   Hypotension    John Walton is a 87 y.o. male.  87 y.o male with a PMH of CKD, DM, frequent headaches, hypertension presents to the ED with a chief complaint of hypotension.  According to wife at the bedside, patient woke up this morning she checks his blood pressure daily, he had 2 consecutive readings with a systolic in 19J, diastolic in the 47W.  Patient states that patient was previously placed on hypertension medication, however he is not taking any of these at this time.  He previously had a stroke in the month of September, and has continued to have low blood pressure at home.  Cording to the nursing aide that checks on him at home, he had a reading of low blood pressure.  Wife does report giving him Norco yesterday in order to help with pain that he has due to a wound on his right heel.  Had a similar episode in the past of hypotension where she was able to given 120 cc of water which helped with his blood pressure.  Patient denies any chest pain, shortness of breath, headache, any complaints at this time.  The history is provided by the patient and medical records.      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[facility])     Patient Active Problem List   Diagnosis Date Noted   Stroke-like symptoms    Acute ischemic stroke ([place]) 12/19/2020   S/P TURP (status post transurethral resection of prostate) 12/19/2020   ABLA (acute blood loss anemia)  12/19/2020   Symptomatic stenosis of right carotid artery 12/19/2020   BPH with obstruction/lower urinary tract symptoms 12/14/2020   Hypocalcemia    Protein-calorie malnutrition, severe 03/29/2020   Hypokalemia 03/29/2020   Hypernatremia 03/29/2020   DM (diabetes mellitus), type 2 ([place]) 03/29/2020   Fever 03/29/2020   Weakness 03/29/2020   Dysphagia 03/26/2020   Bilateral hydronephrosis 03/08/2020   Unilateral primary osteoarthritis, left hip    Femoral neck fracture ([place]) 03/07/2020   CKD (chronic kidney disease), stage IV ([place]) 03/07/2020   Normocytic anemia 03/07/2020   BPH (benign prostatic hyperplasia) 03/07/2020   Abdominal mass 03/07/2020   Scalp hematoma 03/07/2020   Hypertensive urgency 03/07/2020   Pain due to onychomycosis of toenails of both feet 08/13/2019   RBBB 09/02/2014   Screening, ischemic heart disease 07/11/2014   Medicare annual wellness visit, subsequent 07/11/2014   Prostate cancer screening 07/11/2014   Essential hypertension 01/11/2014   Hyperlipidemia 01/11/2014    Past Surgical History:  Procedure Laterality Date   CHOLECYSTECTOMY, LAPAROSCOPIC     ENDARTERECTOMY Right 12/22/2020   Procedure: RIGHT CAROTID ENDARTERECTOMY;  Surgeon: Angelia Mould, MD;  Location: [facility];  Service: Vascular;  Laterality: Right;   IR GASTROSTOMY TUBE MOD SED  03/29/2020   TONSILLECTOMY  04/01/1940   TOTAL HIP  ARTHROPLASTY Left 03/08/2020   Procedure: TOTAL HIP ARTHROPLASTY;  Surgeon: Marybelle Killings, MD;  Location: [facility];  Service: Orthopedics;  Laterality: Left;   TRANSURETHRAL RESECTION OF PROSTATE N/A 12/14/2020   Procedure: TRANSURETHRAL RESECTION OF THE PROSTATE (TURP);  Surgeon: Franchot Gallo, MD;  Location: [facility];  Service: Urology;  Laterality: N/A;  2 HRS       Family History  Problem Relation Age of Onset   Liver disease Mother    Skin cancer Father    Skin cancer Paternal Grandfather    Diabetes Maternal Grandfather     Social History    Tobacco Use   Smoking status: Former    Types: Cigarettes    Start date: 04/01/1968    Quit date: 01/11/1977    Years since quitting: 44.1   Smokeless tobacco: Never  Vaping Use   Vaping Use: Never used  Substance Use Topics   Alcohol use: Not Currently   Drug use: No    Home Medications Prior to Admission medications   Medication Sig Start Date End Date Taking? Authorizing Provider  cephALEXin (KEFLEX) 500 MG capsule Take 1 capsule (500 mg total) by mouth 2 (two) times daily for 7 days. 02/23/21 03/02/21 Yes Janeece Fitting, PA-C  aspirin EC 81 MG tablet Take 1 tablet (81 mg total) by mouth daily. 12/26/20   Thurnell Lose, MD  atorvastatin (LIPITOR) 40 MG tablet Place 1 tablet (40 mg total) into feeding tube daily. Patient taking differently: Take 40 mg by mouth at bedtime. 04/03/20   Aline August, MD  calcium-vitamin D (OSCAL WITH D) 500-200 MG-UNIT tablet Place 1 tablet into feeding tube with breakfast, with lunch, and with evening meal. Patient taking differently: Take 1 tablet by mouth with breakfast, with lunch, and with evening meal. 04/03/20 11/17/21  Aline August, MD  carboxymethylcellulose (REFRESH PLUS) 0.5 % SOLN Place 1 drop into both eyes in the morning, at noon, and at bedtime.    [provider]  collagenase (SANTYL) ointment Apply 1 application topically every other day. APPLIED TO HEEL WOUND    [provider]  ferrous sulfate 325 (65 FE) MG tablet Take 1 tablet (325 mg total) by mouth daily with breakfast. 12/26/20   Thurnell Lose, MD  HYDROcodone-Acetaminophen (NORCO PO) Take 1 tablet by mouth every 6 (six) hours as needed (pain).    [provider]  latanoprost (XALATAN) 0.005 % ophthalmic solution Place 1 drop into both eyes at bedtime.  03/13/19   [provider]  Lifitegrast Shirley Friar) 5 % SOLN Place 1 drop into both eyes in the morning and at bedtime.    [provider]  methimazole (TAPAZOLE) 10 MG tablet Take 10 mg  by mouth in the morning.    [provider]  Nutritional Supplements (PROSOURCE) LIQD Give 30 mLs by tube in the morning and at bedtime.    [provider]  pantoprazole (PROTONIX) 40 MG tablet Take 1 tablet by mouth daily. 01/03/21   [provider]  SIMBRINZA 1-0.2 % SUSP Place 1 drop into both eyes in the morning and at bedtime.  05/16/19   [provider]  sucralfate (CARAFATE) 1 GM/10ML suspension Take 10 mLs (1 g total) by mouth 4 (four) times daily -  with meals and at bedtime. Patient taking differently: Take 1 g by mouth 3 (three) times daily before meals. 04/03/20   Aline August, MD  vitamin B-12 (CYANOCOBALAMIN) 500 MCG tablet Take 1 tablet by mouth daily. 01/03/21  [provider]  Water For Irrigation, Sterile (FREE WATER) SOLN Place 120 mLs into feeding tube 5 (five) times daily. Patient taking differently: Place 120 mLs into feeding tube in the morning and at bedtime. 04/03/20   Aline August, MD  White Petrolatum-Mineral Oil (TEARS AGAIN) OINT Place 1 application into both eyes in the morning and at bedtime.    [provider]    Allergies    Other and Robitussin cold cough+ chest [dextromethorphan-guaifenesin]  Review of Systems   Review of Systems  Constitutional:  Negative for chills and fever.  Respiratory:  Negative for shortness of breath.   Cardiovascular:  Negative for chest pain.  Gastrointestinal:  Negative for abdominal pain, nausea and vomiting.  Neurological:  Negative for light-headedness and headaches.  All other systems reviewed and are negative.  Physical Exam Updated Vital Signs BP (!) 186/87   Pulse 81   Temp 97.9 F (36.6 C) (Oral)   Resp 18   Ht 5\' 6"  (1.676 m)   Wt 66.7 kg   SpO2 99%   BMI 23.73 kg/m   Physical Exam Vitals and nursing note reviewed.  Constitutional:      Appearance: Normal appearance. He is not ill-appearing.  HENT:     Head: Normocephalic and atraumatic.      Mouth/Throat:     Mouth: Mucous membranes are moist.  Eyes:     Pupils: Pupils are equal, round, and reactive to light.  Cardiovascular:     Rate and Rhythm: Normal rate.  Pulmonary:     Effort: Pulmonary effort is normal.     Breath sounds: No wheezing or rales.  Abdominal:     General: Abdomen is flat.     Palpations: Abdomen is soft.     Tenderness: There is no abdominal tenderness.     Comments: Feeding tube in place without any surrounding erythema.  Abdomen is soft.  Musculoskeletal:     Cervical back: Normal range of motion and neck supple.     Comments: Visible wound along the right heel, no erythema, no drainage.  Without any streaking in the skin.  Skin:    General: Skin is warm and dry.  Neurological:     Mental Status: He is alert and oriented to person, place, and time.     Comments: Alert, oriented, thought content appropriate. Speech fluent without evidence of aphasia. Able to follow 2 step commands without difficulty.  Cranial Nerves:  II:  Peripheral visual fields grossly normal, pupils, round, reactive to light III,IV, VI: ptosis not present, extra-ocular motions intact bilaterally  V,VII: smile symmetric, facial light touch sensation equal VIII: hearing grossly normal bilaterally  IX,X: midline uvula rise  XI: bilateral shoulder shrug equal and strong XII: midline tongue extension  Motor:  5/5 in upper and lower extremities bilaterally including strong and equal grip strength and dorsiflexion/plantar flexion Sensory: light touch normal in all extremities.  Cerebellar: normal finger-to-nose with bilateral upper extremities, pronator drift negative        ED Results / Procedures / Treatments   Labs (all labs ordered are listed, but only abnormal results are displayed) Labs Reviewed  CBC WITH DIFFERENTIAL/PLATELET - Abnormal; Notable for the following components:      Result Value   RBC 3.60 (*)    Hemoglobin 10.1 (*)    HCT 32.0 (*)    All other  components within normal limits  COMPREHENSIVE METABOLIC PANEL - Abnormal; Notable for the following components:   Sodium 133 (*)  Glucose, Bld 187 (*)    BUN 44 (*)    Creatinine, Ser 2.40 (*)    Calcium 8.7 (*)    Albumin 3.4 (*)    Total Bilirubin 0.2 (*)    GFR, Estimated 26 (*)    All other components within normal limits  URINALYSIS, ROUTINE W REFLEX MICROSCOPIC - Abnormal; Notable for the following components:   APPearance HAZY (*)    Hgb urine dipstick MODERATE (*)    Protein, ur 30 (*)    Leukocytes,Ua MODERATE (*)    All other components within normal limits  URINALYSIS, MICROSCOPIC (REFLEX) - Abnormal; Notable for the following components:   Bacteria, UA RARE (*)    All other components within normal limits  URINE CULTURE  LACTIC ACID, PLASMA  LACTIC ACID, PLASMA  TROPONIN I (HIGH SENSITIVITY)  TROPONIN I (HIGH SENSITIVITY)    EKG None  Radiology DG Chest 2 View  Result Date: 02/23/2021 CLINICAL DATA:  Hypotension. EXAM: CHEST - 2 VIEW COMPARISON:  07/16/2020 FINDINGS: Heart size and vascularity normal. Lungs clear without infiltrate or effusion. Chronic rotator cuff tear. IMPRESSION: No active cardiopulmonary disease. Electronically Signed   By: Franchot Gallo M.D.   On: 02/23/2021 14:13    Procedures Procedures   Medications Ordered in ED Medications  cefTRIAXone (ROCEPHIN) 1 g in sodium chloride 0.9 % 100 mL IVPB (has no administration in time range)  sodium chloride 0.9 % bolus 1,000 mL (0 mLs Intravenous Stopped 02/23/21 1538)    ED Course  I have reviewed the triage vital signs and the nursing notes.  Pertinent labs & imaging results that were available during my care of the patient were reviewed by me and considered in my medical decision making (see chart for details).  Clinical Course as of 02/23/21 1715  Fri Feb 23, 2021  1708 Chalmers GuestMarland Kitchen): MODERATE [JS]  1708 WBC, UA: 21-50 [JS]    Clinical Course User Index [JS] Janeece Fitting, PA-C    MDM Rules/Calculators/A&P   Patient with extensive past medical history presents to the ED with a chief complaint of hypotension per wife.  According to his wife, she usually measures his blood pressure daily in the mornings, had 2 consecutive readings with a systolic in the 25K to 53Z.  Patient was also evaluated by his home nurse today, she also reported another blood pressure with a systolic in the 76B.  According to wife at the bedside, patient has been more fatigued, sleepy since the month of September.  However, this has been an ongoing issue since his prior stroke in the month of September.  In addition, patient does have a wound to his right heel, this has been an ongoing problem for several months however it is overall well-appearing.  My evaluation patient is overall nontoxic, non-ill-appearing.  His vitals are within normal limits including a normotensive pressure reading, he is not complaining of any pain at this time, without any shortness of breath, without any chest pain, without any headache.  He is without any complaint.  His neurological exam is within the limits today.  His lungs are clear to auscultation without any wheezing, rhonchi, rales.  Abdomen is soft, nontender to palpation, with PEG tube in place. Wound visualized by me to his right heel without any erythema, no signs of infection or drainage noted.  Labs on today's visit reveal a CMP with slight decrease in his sodium, slight elevation his creatinine is likely consistent with dehydration.  He has had decrease in oral intake  per wife as well.  LFTs are within normal limits.  CBC with no leukocytosis, hemoglobin slightly decreased, but actually improved from his previous visit.  Lactic acid was obtained, this was negative will not repeat at this time.  First troponin was negative, not obtain second 1 at this time as he denies any chest pain, any shortness of breath.  His EKG is without any ST changes.  X-ray without any  signs of pneumonia, no acute infection at this time.  Urinalysis with some moderate hemoglobin, 30 protein, moderate leukocytes but rare bacteria along with 21-50 white blood cell count, this specimen has been sent for culture.  He is without any urinary symptoms at this time.  However, due to likely changes in mental status was given 1 g of Rocephin on today's visit.  We will go home on a prescription for Keflex.  He does not have any prior UA cultures noted.  Results were discussed with wife at length, she is aware of plan and treatment at this time.  Patient understands agrees to management, patient stable for discharge.     Portions of this note were generated with [employer]. Dictation errors may occur despite best attempts at proofreading.  Final Clinical Impression(s) / ED Diagnoses Final diagnoses:  Low blood pressure reading  Acute cystitis with hematuria    Rx / DC Orders ED Discharge Orders          Ordered    cephALEXin (KEFLEX) 500 MG capsule  2 times daily        02/23/21 1711             Janeece Fitting, PA-C 02/23/21 1715    Lucrezia Starch, MD 02/24/21 [phone]

## 2021-02-26 LAB — URINE CULTURE

## 2021-04-16 ENCOUNTER — Ambulatory Visit (INDEPENDENT_AMBULATORY_CARE_PROVIDER_SITE_OTHER): Payer: Medicare Other | Admitting: Family

## 2021-04-16 ENCOUNTER — Other Ambulatory Visit: Payer: Self-pay

## 2021-04-16 ENCOUNTER — Encounter (HOSPITAL_BASED_OUTPATIENT_CLINIC_OR_DEPARTMENT_OTHER): Payer: Self-pay | Admitting: Family

## 2021-04-16 VITALS — BP 132/42 | HR 82 | Ht 66.0 in | Wt 145.0 lb

## 2021-04-16 DIAGNOSIS — Z8673 Personal history of transient ischemic attack (TIA), and cerebral infarction without residual deficits: Secondary | ICD-10-CM

## 2021-04-16 DIAGNOSIS — I959 Hypotension, unspecified: Secondary | ICD-10-CM | POA: Diagnosis not present

## 2021-04-16 DIAGNOSIS — I452 Bifascicular block: Secondary | ICD-10-CM

## 2021-04-16 DIAGNOSIS — I6521 Occlusion and stenosis of right carotid artery: Secondary | ICD-10-CM

## 2021-04-16 DIAGNOSIS — E782 Mixed hyperlipidemia: Secondary | ICD-10-CM | POA: Diagnosis not present

## 2021-04-16 DIAGNOSIS — I350 Nonrheumatic aortic (valve) stenosis: Secondary | ICD-10-CM

## 2021-04-16 DIAGNOSIS — R5382 Chronic fatigue, unspecified: Secondary | ICD-10-CM

## 2021-04-16 DIAGNOSIS — E039 Hypothyroidism, unspecified: Secondary | ICD-10-CM

## 2021-04-16 DIAGNOSIS — E118 Type 2 diabetes mellitus with unspecified complications: Secondary | ICD-10-CM

## 2021-04-16 NOTE — Progress Notes (Signed)
Office Visit    Patient Name: John Walton Date of Encounter: 04/16/2021  PCP:  [name], MD   [place]  Cardiologist:  Larae Grooms, MD  Advanced Practice Provider:  No care team member to display Electrophysiologist:  None   Chief Complaint    John Walton is a 88 y.o. male with a hx of hypertension, hyperlipidemia, DM2, CKD IV, migraine, hypothyroidism, chronic anemia, CVA, bifascicular heart block, carotid artery disease s/p right CEA  presents today for follow up of hypertension.  Past Medical History    Past Medical History:  Diagnosis Date   Anemia    Chicken pox    Chronic kidney disease    Coronary artery disease    Diabetes mellitus without complication (HCC)    Frequent headaches    Glaucoma    Hay fever    History of blood transfusion    HTN (hypertension)    Hyperlipidemia    Hyperlipidemia    Hypothyroidism    Migraines    Peripheral vascular disease ([place])    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[facility])    Past Surgical History:  Procedure Laterality Date   CHOLECYSTECTOMY, LAPAROSCOPIC     ENDARTERECTOMY Right 12/22/2020   Procedure: RIGHT CAROTID ENDARTERECTOMY;  Surgeon: Angelia Mould, MD;  Location: [facility] OR;  Service: Vascular;  Laterality: Right;   IR GASTROSTOMY TUBE MOD SED  03/29/2020   TONSILLECTOMY  04/01/1940   TOTAL HIP ARTHROPLASTY Left 03/08/2020   Procedure: TOTAL HIP ARTHROPLASTY;  Surgeon: Marybelle Killings, MD;  [facility]S;  Service: Orthopedics;  Laterality: Left;   TRANSURETHRAL RESECTION OF PROSTATE N/A 12/14/2020   Procedure: TRANSURETHRAL RESECTION OF THE PROSTATE (TURP);  Surgeon: Franchot Gallo, MD;  Location: [facility] ORS;  Service: Urology;  Laterality: N/A;  2 HRS    Allergies  Allergies  Allergen Reactions   Other Swelling    ALL COUGH MEDICATIONS   Robitussin Cold Cough+ Chest [Dextromethorphan-Guaifenesin] Swelling    Laryngeal edema - any cough syrup    History  of Present Illness    John Walton is a 88 y.o. male with a hx of hypertension, hyperlipidemia, DM2, CKD IV, migraine, hypothyroidism, chronic anemia, CVA, bifascicular block, carotid artery disease s/p right CEA last seen 01/11/21.  He was admitted for right vision changes and left arm weakness and numbness found of acute CVA.  He was seen by vascular surgery during admission for symptomatic right carotid stenosis greater than 80% and right carotid endarterectomy was performed.  Echo 12/20/2018 LVEF 55 to 60%, hypokinesis of LV, apical inferior wall, trivial MR, trivial AR, mild to moderate AS with aortic valve area, by VTI measures 1.93 cm. Aortic valve mean gradient measures 16.0 mmHg. Aortic valve Vmax measures 2.49 m/s. mild dilatation of the aortic root 42 mm, mild dilatation of the ascending aorta 41 mm.  He was seen 01/11/21 doing overall well since discharge. Still with some mobility limitation to right arm though weakness improved. Carvedilol was discontinued due to hypotension.    Seen in ED 02/23/21 due to hypotension. His creatinine was slightly increased and dehydration thought to be contributory. He was provided IVF. Urinalysis with moderate leukocytes and given 1g IV Rocephin and discharged with Keflex.   He presents today for follow up with his wife. [facility] a [facility] monitor on 03/02/21 (provider Uvaldo Bristle, [place]). They do not quite have the results yet. They next see cardiology at the [facility] in June. Tells me  blood pressure has still been low at home. He is not presently taking antihypertensive agents. He is overall asymptomatic with no lightheadedness, dizziness, near-syncope, syncope.  Still with poor p.o. intake.  He does have PEG tube in place. Has an appointment with nutritionist tomorrow at the [place]. History predominantly assisted by wife as Mr. Codispoti is hard of hearing. He assures me that "there is nothing wrong with me". Mrs. Cerone's chief concern is that he seems to sleep a lot. He is  still receiving HH PT/OT/RN twice per week and also does the exercises on his own. [place] RN is wrapping a heel wound that is not improving and he is following with podiatry regarding this.   EKGs/Labs/Other Studies Reviewed:   The following studies were reviewed today:  Echo from 12/19/20:   1. Left ventricular ejection fraction, by estimation, is 55 to 60%. The  left ventricle has normal function. The left ventricle demonstrates  regional wall motion abnormalities (see scoring diagram/findings for  description). Left ventricular diastolic  parameters were normal. There is hypokinesis of the left ventricular,  apical inferior wall.   2. Right ventricular systolic function is normal. The right ventricular  size is normal. Tricuspid regurgitation signal is inadequate for assessing  PA pressure.   3. The mitral valve is normal in structure. Trivial mitral valve  regurgitation. No evidence of mitral stenosis.   4. The aortic valve is calcified. There is severe calcifcation of the  aortic valve. There is severe thickening of the aortic valve. Aortic valve  regurgitation is trivial. Mild to moderate aortic valve stenosis. Aortic  valve area, by VTI measures 1.93  cm. Aortic valve mean gradient measures 16.0 mmHg. Aortic valve Vmax  measures 2.49 m/s.   5. Aortic dilatation noted. There is mild dilatation of the aortic root,  measuring 42 mm. There is mild dilatation of the ascending aorta,  measuring 41 mm.     EKG:  No EKG today  Recent Labs: 12/19/2020: TSH 1.259 02/23/2021: ALT 16; BUN 44; Creatinine, Ser 2.40; Hemoglobin 10.1; Platelets 197; Potassium 4.3; Sodium 133  Recent Lipid Panel    Component Value Date/Time   CHOL 114 12/19/2020 0742   TRIG 99 12/19/2020 0742   HDL 36 (L) 12/19/2020 0742   CHOLHDL 3.2 12/19/2020 0742   VLDL 20 12/19/2020 0742   LDLCALC 58 12/19/2020 0742   LDLDIRECT 137.0 07/11/2014 0850   Home Medications   Current Meds  Medication Sig   aspirin  EC 81 MG tablet Take 1 tablet (81 mg total) by mouth daily.   atorvastatin (LIPITOR) 40 MG tablet Take 40 mg by mouth daily.   Calcium-Vitamin D 500-3.125 MG-MCG TABS Take 1 tablet by mouth in the morning, at noon, and at bedtime.   carboxymethylcellulose (REFRESH PLUS) 0.5 % SOLN Place 1 drop into both eyes in the morning, at noon, and at bedtime.   collagenase (SANTYL) ointment Apply 1 application topically every other day. APPLIED TO HEEL WOUND   ferrous sulfate 325 (65 FE) MG tablet Take 1 tablet (325 mg total) by mouth daily with breakfast.   HYDROcodone-Acetaminophen (NORCO PO) Take 1 tablet by mouth every 6 (six) hours as needed (pain).   latanoprost (XALATAN) 0.005 % ophthalmic solution Place 1 drop into both eyes at bedtime.    Lifitegrast (XIIDRA) 5 % SOLN Place 1 drop into both eyes in the morning and at bedtime.   methimazole (TAPAZOLE) 10 MG tablet Take 10 mg by mouth in the morning.   methimazole (  TAPAZOLE) 5 MG tablet Take 1 tablet by mouth daily.   Nutritional Supplements (PROSOURCE) LIQD Give 30 mLs by tube in the morning and at bedtime.   SIMBRINZA 1-0.2 % SUSP Place 1 drop into both eyes in the morning and at bedtime.    sucralfate (CARAFATE) 1 GM/10ML suspension Take 10 mLs (1 g total) by mouth 4 (four) times daily -  with meals and at bedtime.   vitamin B-12 (CYANOCOBALAMIN) 500 MCG tablet Take 1 tablet by mouth 2 (two) times daily.   Water For Irrigation, Sterile (FREE WATER) SOLN Place 120 mLs into feeding tube 5 (five) times daily.   White Petrolatum-Mineral Oil (TEARS AGAIN) OINT Place 1 application into both eyes in the morning and at bedtime.     Review of Systems      All other systems reviewed and are otherwise negative except as noted above.  Physical Exam    VS:  BP (!) 132/42    Pulse 82    Ht 5\' 6"  (1.676 m)    Wt 145 lb (65.8 kg)    SpO2 98%    BMI 23.40 kg/m  , BMI Body mass index is 23.4 kg/m.  Wt Readings from Last 3 Encounters:  04/16/21 145 lb  (65.8 kg)  02/23/21 147 lb 0.8 oz (66.7 kg)  02/13/21 147 lb (66.7 kg)    GEN: Well nourished, well developed, in no acute distress. HEENT: normal. Neck: Supple, no JVD, carotid bruits, or masses. Cardiac: RRR, no murmurs, rubs, or gallops. No clubbing, cyanosis, edema.  Radials/PT 2+ and equal bilaterally.  Respiratory:  Respirations regular and unlabored, clear to auscultation bilaterally. GI: Soft, nontender, nondistended. MS: No deformity or atrophy. Skin: Warm and dry, no rash. Neuro:  Strength and sensation are intact. Psych: Normal affect.  Assessment & Plan   HTN -  Now with hypotension. Carvedilol has been discontinued. Not presently on antihypertensive agent. Orthostatic precautions discussed. Encouraged increased PO hydration, regular meals, slow position change.   Fatigue - Multifactorial deconditioning, hypotension, hx of CVA. Working with [facility] therapies. Appt tomorrow with nutrition. Has PEG tube in place. Encouraged to continue to follow with primary care.   Hyperlipidemia - 12/19/20 LDL 58. Continue Atorvastatin 40mg  QD. Denies myalgias.   DM2 - Continue to follow with PCP. 12/19/20 A1c 6.5. Continue to follow with PCP.   CKD IV - Careful titration of diuretic and antihypertensive.    Hypothyroidism - Continue to follow with PCP.   Mild to moderate aortic stenosis - No chest pain, dyspnea, syncope. Repeat echo scheduled for 11/2021. Continue optimal BP control.   History of CVA - mobility improving with HH PT. Continue optimal BP and lipid control. Continue Aspirin, statin. Continue to follow with neurology.   Bifascicular heart block - Continue to monitor with periodic EKG. No lightheadedness, dizziness, near syncope, syncope.  BP running low at home. VA placed ZIO monitor 03/02/21. Results still pending.   Carotid artery disease s/p right CEA - Incision healing well. Continue to follow with vascular. Continue Aspirin, Statin.   Disposition: Follow up in April as  scheduled with Dr. Irish Lack. Offered to delay to 6 month follow up but wife prefers to keep appointment as scheduled.   Signed, Loel Dubonnet, NP 04/16/2021, 1:41 PM [facility]

## 2021-04-16 NOTE — Patient Instructions (Signed)
Medication Instructions:  No medication changes today.   *If you need a refill on your cardiac medications before your next appointment, please call your pharmacy*  Lab Work: None ordered today.   Testing/Procedures: None ordered today.   Follow-Up: At [facility], you and your health needs are our priority.  As part of our continuing mission to provide you with exceptional heart care, we have created designated Provider Care Teams.  These Care Teams include your primary Cardiologist (physician) and Advanced Practice Providers (APPs -  Physician Assistants and Nurse Practitioners) who all work together to provide you with the care you need, when you need it.  We recommend signing up for the patient portal called "MyChart".  Sign up information is provided on this After Visit Summary.  MyChart is used to connect with patients for Virtual Visits (Telemedicine).  Patients are able to view lab/test results, encounter notes, upcoming appointments, etc.  Non-urgent messages can be sent to your provider as well.   To learn more about what you can do with MyChart, go to [email or web address].    Your next appointment:   In April as scheduled with Dr. Irish Lack    Other Instructions  To prevent low blood pressure: Eat and drink regular meals Make position changes slowly. You may eat extra salt or add something with electrolytes to help prevent low blood pressure.

## 2021-05-14 ENCOUNTER — Other Ambulatory Visit: Payer: Self-pay

## 2021-05-14 ENCOUNTER — Ambulatory Visit: Payer: Medicare Other | Attending: Internal Medicine

## 2021-05-14 DIAGNOSIS — R41841 Cognitive communication deficit: Secondary | ICD-10-CM | POA: Insufficient documentation

## 2021-05-14 NOTE — Therapy (Signed)
[facility] [address] [place], [place], [address] Phone: [phone]   Fax:  [phone]  Patient Details  Name: John Walton MRN: [id number] Date of Birth: Aug 02, 1932 Referring Provider:  Domenick Bookbinder, MD   ST - Arrive/Cancel It was learned that pt has had [place] since a hip replacement in 2021, and also was ordered following his CVA in September 2022. Pt and wife wish to continue these services as wife states it is challenging to get pt out of the home at this time.  This SLP attempted to contact [facility] MD, Dr. Jonnie Kind, however was unable to leave a message on her extension obtained from pt's wife [phone]) - the phone rang continuously and no voice mail was activated.  SLP will attempt to call. SLP also told pt's wife to call [facility] and inform Dr. Jonnie Kind that pt needs HHST added to his [facility] services.  Encounter Date: 05/14/2021   Glennis Brink 05/14/2021, 2:01 PM  [facility] [place] [address], [place], [place], [address] Phone: [phone]   Fax:  [phone]

## 2021-06-12 ENCOUNTER — Ambulatory Visit (INDEPENDENT_AMBULATORY_CARE_PROVIDER_SITE_OTHER): Payer: Medicare Other | Admitting: Adult Health

## 2021-06-12 ENCOUNTER — Encounter: Payer: Self-pay | Admitting: Adult Health

## 2021-06-12 ENCOUNTER — Other Ambulatory Visit: Payer: Self-pay

## 2021-06-12 VITALS — BP 140/62 | HR 76 | Ht 66.0 in | Wt 148.0 lb

## 2021-06-12 DIAGNOSIS — I63031 Cerebral infarction due to thrombosis of right carotid artery: Secondary | ICD-10-CM

## 2021-06-12 NOTE — Patient Instructions (Signed)
Your Plan: ? ?Continue current treatment plan and close follow-up with PCP for aggressive stroke risk factor management ? ? ? ? ? ?Thank you for coming to see [place] at [facility]. I hope we have been able to provide you high quality care today. ? ?You may receive a patient satisfaction survey over the next few weeks. We would appreciate your feedback and comments so that we may continue to improve ourselves and the health of our patients. ? ?

## 2021-06-12 NOTE — Progress Notes (Signed)
?[facility] ?[address] ?[place]. [place] [address] ?[phone] ? ?     STROKE FOLLOW UP NOTE ? ?Mr. John Walton ?Date of Birth:  Sep 19, 1932 ?Medical Record Number:  [id number]  ? ?Reason for Referral: stroke follow up ? ? ? ?SUBJECTIVE: ? ? ?CHIEF COMPLAINT:  ?Chief Complaint  ?Patient presents with  ? Follow-up  ?  Rm 2 with wife here for 4 month f/u pt reports he has been doing ok since last visit- has noticed a pain in the top of the left foot.   ? ? ?HPI:  ? ?Update 06/12/2021 JM: patient being seen for stroke follow up accompanied by his wife.  Overall stable since prior visit without new stroke/TIA symptoms. Continued short term memory - currently awaiting to start [facility] SLP. Completed HH OT, still working with [facility] PT working on balance and strengthening with overall ongoing improvement. Able to maintain majority of ADLs but does require some assistance with dressing due to limited motion of shoulder. Does report top of foot pain at night, denies nerve type pain, more just a constant pressure sensation, has been gradually worsening. Does follow with podiatry - has f/u visit next week and wife plans on further discussing. Compliant on aspirin and atorvastatin, denies side effects.  Blood pressure today 140/62. Monitors at home - was having issues with it being too low but this has since improved. Has been trying to increase water intake, wife will give additional fluid and protein via PEG tube.  Closely followed by PCP, [facility], cardiology and [facility].  No further concerns at this time. ? ? ? ? ?History provided for reference purposes only ?Update 02/13/2021, John Walton is being seen for initial hospital follow-up accompanied by his wife.  ? ?Currently working with [facility] PT/OT 2x/week - he reports he has been doing well but per wife, not as strong since stroke (generalized although greatly improving), memory slightly worsened post stroke and increased fatigue.  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 ?Denies new stroke/TIA symptoms ? ?Compliant on aspirin and atorvastatin -denies side effects ?Blood pressure today 147/73 ? ?F/u with [facility] 10/13 - plan to repeat carotid duplex in 9 months ?F/u with cardiology 10/13 - no changes in ? ?No further concerns at this time ? ?Stroke admission 12/18/2020 ?John Walton is a 88 year old male with history of hypertension, hyperlipidemia, diabetes, CAD, migraine, PVD, CKD, hypothyroidism, baseline anemia, TURP 9/15 with postop hematuria, right carotid stenosis 60 to 79% in 08/2020 who presented on 12/18/2020 for right sided vision changes and left arm weakness and numbness.  Personally reviewed hospitalization pertinent progress notes, lab work and imaging.  Evaluated by Dr. Erlinda Hong.  CT no acute abnormality.  MRI showed right MCA scattered infarcts. MRA head normal.  Carotid Doppler right ICA 80 to 99% stenosis.  EF 55 to 60%, MRA negative.  LDL 58, A1c 6.5.  WBC 11.4, however, hemoglobin 7.2-> 7.1 with baseline around 8.5, received 1 unit PRBC transfusion.  Etiology for stroke likely due to right high-grade ICA  stenosis.  Unable to complete CTA head/neck due to elevated creatinine in setting of CKD. VVS consult Dr. Scot Dock - s/p R CEA 9/23.  Given severe anemia, recommended aspirin 325 mg daily monotherapy and continuation of atorvastatin 40 mg daily. PT/OT initially recommended CIR but as he progressed with therapy recommended PT/OT ([place] requested by wife) and discharged home on  12/26/2020 ? ? ? ? ? ? ?PERTINENT IMAGING ? ?CT head No acute abnormality.  ?MRI  1. Patchy multifocal acute ischemic infarcts involving the cortical gray matter of the right frontal, parietal, and occipital lobes as above. No associated hemorrhage or mass effect. 2. 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 3. Underlying moderate chronic microvascular ischemic disease. ?MRA head  normal  ?2D Echo EF 55 to 60% ?Caroid [place]; RICA with 80-99% stenosis. LICA 6-59% stenosis ?LDL 58 ?HgbA1c 6.5 ? ? ? ?ROS:   ?14 system review of systems performed and negative with exception of those listed in HPI ? ?PMH:  ?Past Medical History:  ?Diagnosis Date  ? Anemia   ? Chicken pox   ? Chronic kidney disease   ? Coronary artery disease   ? Diabetes mellitus without complication ([place])   ? Frequent headaches   ? Glaucoma   ? Hay fever   ? History of blood transfusion   ? HTN (hypertension)   ? Hyperlipidemia   ? Hyperlipidemia   ? Hypothyroidism   ? Migraines   ? Peripheral vascular disease ([place])   ? 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[facility])   ? ? ?PSH:  ?Past Surgical History:  ?Procedure Laterality Date  ? CHOLECYSTECTOMY, LAPAROSCOPIC    ? ENDARTERECTOMY Right 12/22/2020  ? Procedure: RIGHT CAROTID ENDARTERECTOMY;  Surgeon: Angelia Mould, MD;  Location: [facility] OR;  Service: Vascular;  Laterality: Right;  ? IR GASTROSTOMY TUBE MOD SED  03/29/2020  ? TONSILLECTOMY  04/01/1940  ? TOTAL HIP ARTHROPLASTY Left 03/08/2020  ? Procedure: TOTAL HIP ARTHROPLASTY;  Surgeon: Marybelle Killings, MD;  [facility];  Service: Orthopedics;  Laterality: Left;  ? TRANSURETHRAL RESECTION OF PROSTATE N/A 12/14/2020  ? Procedure: TRANSURETHRAL RESECTION OF THE PROSTATE (TURP);  Surgeon: Franchot Gallo, MD;  [facility];  Service: Urology;  Laterality: N/A;  2 HRS  ? ? ?Social History:  ?Social History  ? ?Socioeconomic History  ? Marital status: Married  ?  Spouse name: Not on file  ? Number of children: Not on file   ? Years of education: Not on file  ? Highest education level: Not on file  ?Occupational History  ? Not on file  ?Tobacco Use  ? Smoking status: Former  ?  Types: Cigarettes  ?  Start date: 04/01/1968  ?  Quit date: 01/11/1977  ?  Years since quitting: 44.4  ? Smokeless tobacco: Never  ?Vaping Use  ? Vaping Use: Never used  ?Substance and Sexual Activity  ? Alcohol use: Not Currently  ? Drug use: No  ? Sexual activity: Never  ?Other Topics Concern  ? Not on file  ?Social History Narrative  ? Not on file  ? ?Social Determinants of Health  ? ?Financial Resource Strain: Not on file  ?Food Insecurity: Not on file  ?Transportation Needs: Not on file  ?Physical Activity: Not on file  ?Stress: Not on file  ?Social Connections: Not on file  ?Intimate Partner Violence: Not on file  ? ? ?Family History:  ?Family History  ?Problem Relation Age of Onset  ? Liver disease Mother   ?  Skin cancer Father   ? Skin cancer Paternal Grandfather   ? Diabetes Maternal Grandfather   ? ? ?Medications:   ?Current Outpatient Medications on File Prior to Visit  ?Medication Sig Dispense Refill  ? aspirin EC 81 MG tablet Take 1 tablet (81 mg total) by mouth daily. 30 tablet 0  ? atorvastatin (LIPITOR) 40 MG tablet Take 40 mg by mouth daily.    ? Calcium-Vitamin D 500-3.125 MG-MCG TABS Take 1 tablet by mouth in the morning, at noon, and at bedtime.    ? carboxymethylcellulose (REFRESH PLUS) 0.5 % SOLN Place 1 drop into both eyes in the morning, at noon, and at bedtime.    ? collagenase (SANTYL) ointment Apply 1 application topically every other day. APPLIED TO HEEL WOUND    ? ferrous sulfate 325 (65 FE) MG tablet Take 1 tablet (325 mg total) by mouth daily with breakfast. 30 tablet 0  ? HYDROcodone-Acetaminophen (NORCO PO) Take 1 tablet by mouth every 6 (six) hours as needed (pain).    ? latanoprost (XALATAN) 0.005 % ophthalmic solution Place 1 drop into both eyes at bedtime.     ? Lifitegrast (XIIDRA) 5 % SOLN Place 1 drop into both eyes in the  morning and at bedtime.    ? methimazole (TAPAZOLE) 5 MG tablet Take 1 tablet by mouth daily.    ? nutrition supplement, JUVEN, (JUVEN) PACK Take 1 packet by mouth 2 (two) times daily between meals.    ? Nutritional Su

## 2021-06-14 ENCOUNTER — Encounter: Payer: Self-pay | Admitting: Interventional Cardiology

## 2021-06-14 NOTE — Telephone Encounter (Signed)
Error. No encounter needed  ?

## 2021-07-05 ENCOUNTER — Ambulatory Visit: Payer: No Typology Code available for payment source | Admitting: Interventional Cardiology

## 2021-07-23 ENCOUNTER — Other Ambulatory Visit: Payer: Self-pay

## 2021-07-23 DIAGNOSIS — L97909 Non-pressure chronic ulcer of unspecified part of unspecified lower leg with unspecified severity: Secondary | ICD-10-CM

## 2021-07-25 IMAGING — DX DG CHEST 1V PORT
1 series · 1 of 1 positions shown · non-contrast
Comparison: 03/19/2020

CLINICAL DATA: Choose recent choking episode

EXAM:
PORTABLE CHEST 1 VIEW

[chest ap]
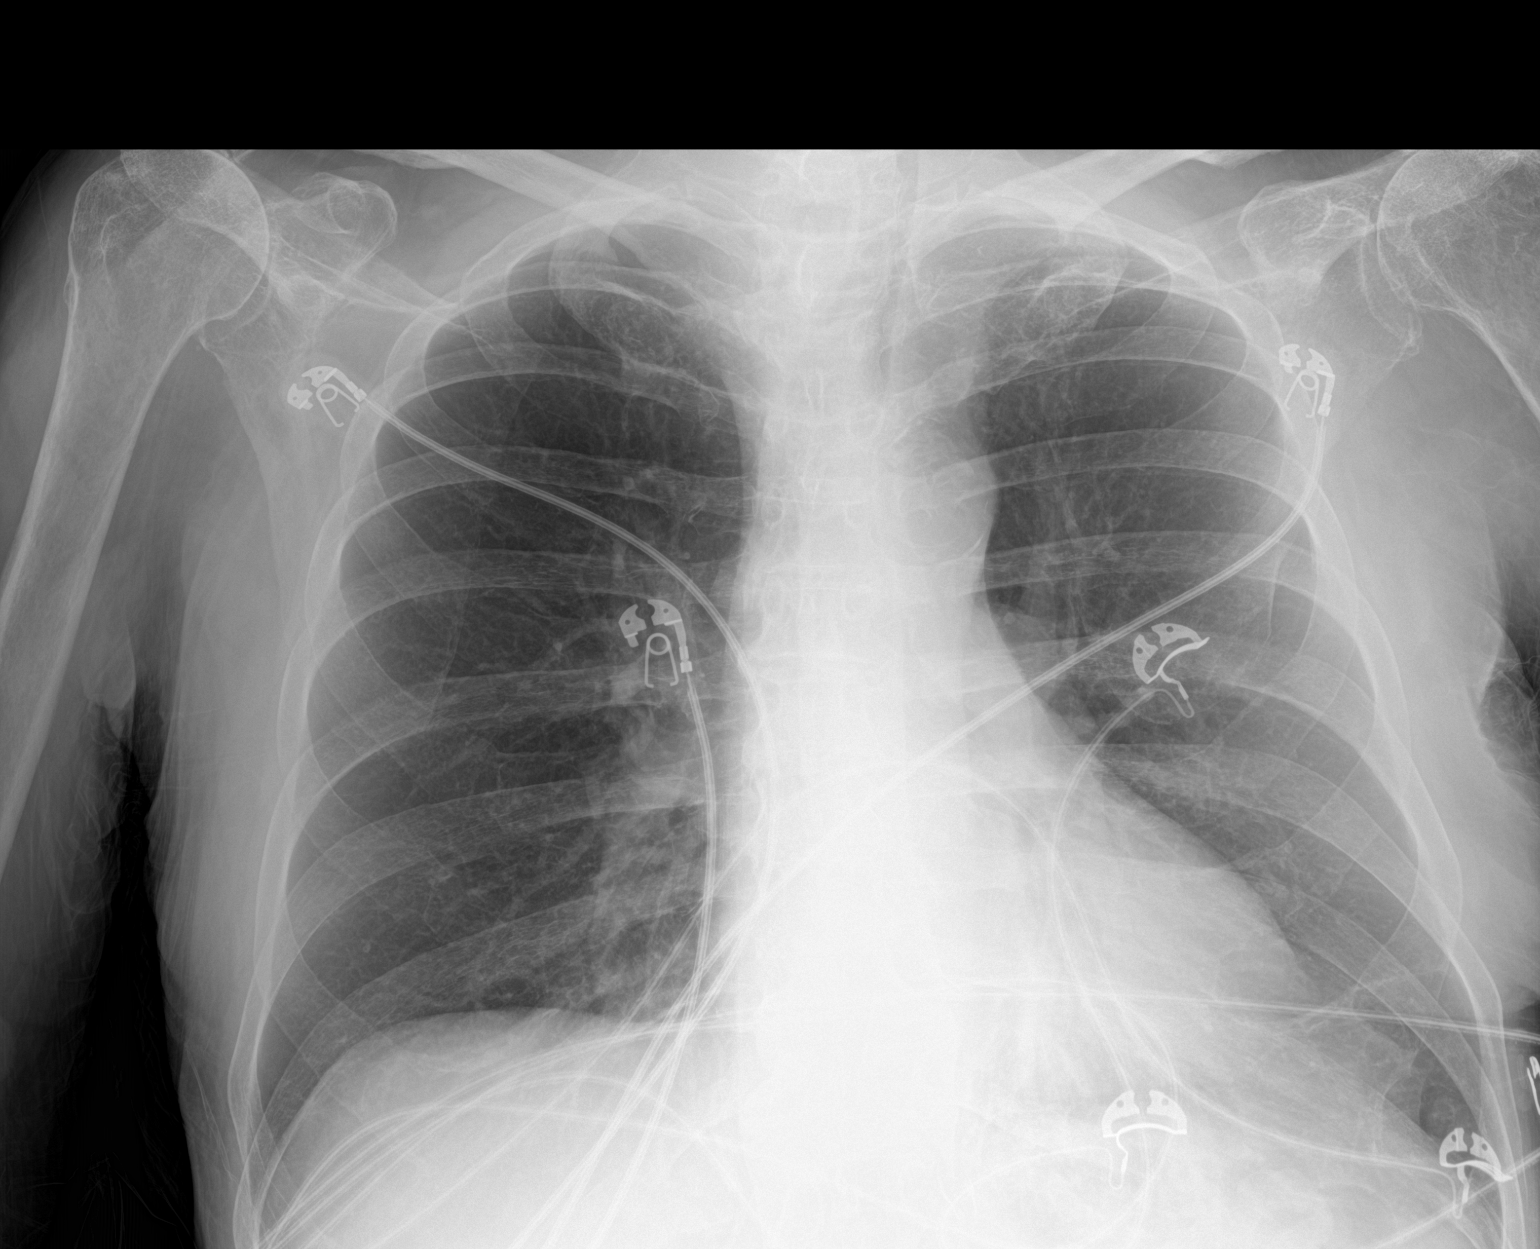

[1 of 1 positions shown; findings below may reference images not displayed]

FINDINGS: Cardiac shadow is within normal limits. Aortic calcifications are
noted. Lungs are well aerated bilaterally. No focal infiltrate or
sizable effusion is seen. No bony abnormality is noted.
IMPRESSION: No active disease.

## 2021-08-02 ENCOUNTER — Ambulatory Visit (INDEPENDENT_AMBULATORY_CARE_PROVIDER_SITE_OTHER): Payer: Medicare Other | Admitting: Vascular Surgery

## 2021-08-02 ENCOUNTER — Ambulatory Visit (HOSPITAL_COMMUNITY)
Admission: RE | Admit: 2021-08-02 | Discharge: 2021-08-02 | Disposition: A | Discharge status: Home / Self Care | Payer: No Typology Code available for payment source | Source: Ambulatory Visit | Attending: Vascular Surgery | Admitting: Vascular Surgery

## 2021-08-02 ENCOUNTER — Encounter: Payer: Self-pay | Admitting: Vascular Surgery

## 2021-08-02 VITALS — BP 133/73 | HR 88 | Temp 98.2°F | Resp 20 | Ht 66.0 in | Wt 152.0 lb

## 2021-08-02 DIAGNOSIS — I70299 Other atherosclerosis of native arteries of extremities, unspecified extremity: Secondary | ICD-10-CM | POA: Diagnosis present

## 2021-08-02 DIAGNOSIS — L97909 Non-pressure chronic ulcer of unspecified part of unspecified lower leg with unspecified severity: Secondary | ICD-10-CM | POA: Insufficient documentation

## 2021-08-02 DIAGNOSIS — I6521 Occlusion and stenosis of right carotid artery: Secondary | ICD-10-CM

## 2021-08-02 DIAGNOSIS — I739 Peripheral vascular disease, unspecified: Secondary | ICD-10-CM

## 2021-08-02 NOTE — Progress Notes (Signed)
? ? ?REASON FOR VISIT:  ? ?Peripheral arterial disease with nonhealing ulcer.  The consult is requested by Dr. Iona Coach. ? ?MEDICAL ISSUES:  ? ?S/P RIGHT CAROTID ENDARTERECTOMY: This patient underwent a right carotid endarterectomy with primary closure on 12/22/2020 for a right brain stroke.  Of note he had no significant disease on the left side.  He would be due for his 48-monthfollow-up carotid duplex scan in June of this year. ? ?RIGHT HEEL WOUND: He was referred for evaluation of right heel wound which at this point has healed as documented in the photograph below.  His arterial Doppler study today shows no evidence of significant arterial disease.  Toe pressure on the right is 125 mmHg which shows excellent perfusion.  No further arterial work-up is indicated.  I have discussed with him the importance of leg elevation as I think the swelling certainly may have contributed to the prolonged nature of that wound.  He had some significant leg swelling.  We have discussed the most effective physician for leg elevation. ? ?I will see him back in June for his follow-up duplex scan. ? ?Of note, he was kind enough to leave [place] one of his botanical therapeutic agents for migraines which he tells me is he has a patent on and may be approved by the FDA soon.  We will have to get the follow-up out map at his next visit. ? ? ?HPI:  ? ?John FERGUSSONis a pleasant 88y.o. male who was referred for evaluation of a wound on the right heel.  He tells me that he developed a wound on the right heel after hip surgery in December 2021.  The wound was chronic but at this point has ultimately healed.  One of the issues apparently was significant leg swelling.  He denies any history of claudication although his activity is very limited.  He denies any history of rest pain.  He is not a smoker. ? ?With respect to his carotid disease he has had no new focal weakness or paresthesias.  He denies expressive or receptive aphasia,  or amaurosis fugax. ? ?He is on aspirin and is on a statin. ? ?Past Medical History:  ?Diagnosis Date  ? Anemia   ? Chicken pox   ? Chronic kidney disease   ? Coronary artery disease   ? [place]   ? Frequent headaches   ? Glaucoma   ? Hay fever   ? History of blood transfusion   ? HTN (hypertension)   ? Hyperlipidemia   ? Hyperlipidemia   ? Hypothyroidism   ? Migraines   ? Peripheral vascular disease ([facility]   ? 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([facility]   ? ? ?Family History  ?Problem Relation Age of Onset  ? Liver disease Mother   ? Skin cancer Father   ? Skin cancer Paternal Grandfather   ? Diabetes Maternal Grandfather   ? ? ?SOCIAL HISTORY: ?Social History  ? ?Tobacco Use  ? Smoking status: Former  ?  Types: Cigarettes  ?  Start date: 04/01/1968  ?  Quit date: 01/11/1977  ?  Years since quitting: 44.5  ? Smokeless tobacco: Never  ?Substance Use Topics  ? Alcohol use: Not Currently  ? ? ?Allergies  ?Allergen Reactions  ? Other Swelling  ?  ALL COUGH MEDICATIONS  ? Robitussin Cold Cough+ Chest [Dextromethorphan-Guaifenesin] Swelling  ?  Laryngeal edema - any cough syrup  ? ? ?Current Outpatient Medications  ?  Medication Sig Dispense Refill  ? aspirin EC 81 MG tablet Take 1 tablet (81 mg total) by mouth daily. 30 tablet 0  ? atorvastatin (LIPITOR) 40 MG tablet Take 40 mg by mouth daily.    ? Calcium-Vitamin D 500-3.125 MG-MCG TABS Take 1 tablet by mouth in the morning, at noon, and at bedtime.    ? carboxymethylcellulose (REFRESH PLUS) 0.5 % SOLN Place 1 drop into both eyes in the morning, at noon, and at bedtime.    ? collagenase (SANTYL) ointment Apply 1 application topically every other day. APPLIED TO HEEL WOUND    ? ferrous sulfate 325 (65 FE) MG tablet Take 1 tablet (325 mg total) by mouth daily with breakfast. 30 tablet 0  ? HYDROcodone-Acetaminophen (NORCO PO) Take 1 tablet by mouth every 6 (six) hours as needed (pain).    ? latanoprost (XALATAN) 0.005 % ophthalmic solution  Place 1 drop into both eyes at bedtime.     ? Lifitegrast (XIIDRA) 5 % SOLN Place 1 drop into both eyes in the morning and at bedtime.    ? methimazole (TAPAZOLE) 5 MG tablet Take 1 tablet by mouth daily.    ? nutrition supplement, JUVEN, (JUVEN) PACK Take 1 packet by mouth 2 (two) times daily between meals.    ? Nutritional Supplements (PROSOURCE) LIQD Give 30 mLs by tube in the morning and at bedtime.    ? SIMBRINZA 1-0.2 % SUSP Place 1 drop into both eyes in the morning and at bedtime.     ? sucralfate (CARAFATE) 1 GM/10ML suspension Take 10 mLs (1 g total) by mouth 4 (four) times daily -  with meals and at bedtime. 420 mL 0  ? vitamin B-12 (CYANOCOBALAMIN) 500 MCG tablet Take 1 tablet by mouth 2 (two) times daily.    ? Water For Irrigation, Sterile (FREE WATER) SOLN Place 120 mLs into feeding tube 5 (five) times daily. 1000 mL 15  ? White Petrolatum-Mineral Oil (TEARS AGAIN) OINT Place 1 application into both eyes in the morning and at bedtime.    ? ?No current facility-administered medications for this visit.  ? ? ?REVIEW OF SYSTEMS:  ?'[X]'$  denotes positive finding, '[ ]'$  denotes negative finding ?Cardiac  Comments:  ?Chest pain or chest pressure:    ?Shortness of breath upon exertion:    ?Short of breath when lying flat:    ?Irregular heart rhythm:    ?    ?Vascular    ?Pain in calf, thigh, or hip brought on by ambulation:    ?Pain in feet at night that wakes you up from your sleep:     ?Blood clot in your veins:    ?Leg swelling:     ?    ?Pulmonary    ?Oxygen at home:    ?Productive cough:     ?Wheezing:     ?    ?Neurologic    ?Sudden weakness in arms or legs:     ?Sudden numbness in arms or legs:     ?Sudden onset of difficulty speaking or slurred speech:    ?Temporary loss of vision in one eye:     ?Problems with dizziness:     ?    ?Gastrointestinal    ?Blood in stool:     ?Vomited blood:     ?    ?Genitourinary    ?Burning when urinating:     ?Blood in urine:    ?    ?Psychiatric    ?Major depression:      ?    ?  Hematologic    ?Bleeding problems:    ?Problems with blood clotting too easily:    ?    ?Skin    ?Rashes or ulcers:    ?    ?Constitutional    ?Fever or chills:    ? ?PHYSICAL EXAM:  ? ?Vitals:  ? 08/02/21 1440  ?BP: 133/73  ?Pulse: 88  ?Resp: 20  ?Temp: 98.2 ?F (36.8 ?C)  ?SpO2: 97%  ?Weight: 152 lb (68.9 kg)  ?Height: '5\' 6"'$  (1.676 m)  ? ? ?GENERAL: The patient is a well-nourished male, in no acute distress. The vital signs are documented above. ?CARDIAC: There is a regular rate and rhythm.  ?VASCULAR: I do not detect carotid bruits. ?He has palpable femoral pulses. ?He has bilateral lower extremity swelling but difficult to assess his pulses. ?He has a healed wound on his right heel. ? ? ?PULMONARY: There is good air exchange bilaterally without wheezing or rales. ?ABDOMEN: Soft and non-tender with normal pitched bowel sounds.  ?MUSCULOSKELETAL: There are no major deformities or cyanosis. ?NEUROLOGIC: No focal weakness or paresthesias are detected. ?SKIN: There are no ulcers or rashes noted. ?PSYCHIATRIC: The patient has a normal affect. ? ?DATA:   ? ?ARTERIAL DOPPLER STUDY: I have independently interpreted his arterial Doppler study today. ? ?On the right side there is a triphasic dorsalis pedis and posterior tibial signal.  ABIs 100%.  Toe pressure is 125 mmHg. ? ?On the left side he has a triphasic dorsalis pedis and posterior tibial signal.  ABI is 100%.  Toe pressure is 96 mmHg. ? ?John Walton ?[facility]  ?Office [phone] ?

## 2021-08-06 ENCOUNTER — Encounter (HOSPITAL_BASED_OUTPATIENT_CLINIC_OR_DEPARTMENT_OTHER): Payer: Self-pay | Admitting: Emergency Medicine

## 2021-08-06 ENCOUNTER — Other Ambulatory Visit: Payer: Self-pay

## 2021-08-06 ENCOUNTER — Emergency Department (HOSPITAL_BASED_OUTPATIENT_CLINIC_OR_DEPARTMENT_OTHER)
Admission: EM | Admit: 2021-08-06 | Discharge: 2021-08-06 | Disposition: A | Discharge status: Home / Self Care | Payer: No Typology Code available for payment source | Attending: Emergency Medicine | Admitting: Emergency Medicine

## 2021-08-06 DIAGNOSIS — E119 Type 2 diabetes mellitus without complications: Secondary | ICD-10-CM | POA: Insufficient documentation

## 2021-08-06 DIAGNOSIS — I10 Essential (primary) hypertension: Secondary | ICD-10-CM | POA: Insufficient documentation

## 2021-08-06 DIAGNOSIS — I959 Hypotension, unspecified: Secondary | ICD-10-CM | POA: Insufficient documentation

## 2021-08-06 DIAGNOSIS — Z7982 Long term (current) use of aspirin: Secondary | ICD-10-CM | POA: Diagnosis not present

## 2021-08-06 DIAGNOSIS — Z79899 Other long term (current) drug therapy: Secondary | ICD-10-CM | POA: Diagnosis not present

## 2021-08-06 LAB — CBC WITH DIFFERENTIAL/PLATELET
Abs Immature Granulocytes: 0.05 10*3/uL (ref 0.00–0.07)
Basophils Absolute: 0 10*3/uL (ref 0.0–0.1)
Basophils Relative: 0 %
Eosinophils Absolute: 0.3 10*3/uL (ref 0.0–0.5)
Eosinophils Relative: 4 %
HCT: 27.1 % — ABNORMAL LOW (ref 39.0–52.0)
Hemoglobin: 8.5 g/dL — ABNORMAL LOW (ref 13.0–17.0)
Immature Granulocytes: 1 %
Lymphocytes Relative: 11 %
Lymphs Abs: 0.7 10*3/uL (ref 0.7–4.0)
MCH: 28.3 pg (ref 26.0–34.0)
MCHC: 31.4 g/dL (ref 30.0–36.0)
MCV: 90.3 fL (ref 80.0–100.0)
Monocytes Absolute: 1.1 10*3/uL — ABNORMAL HIGH (ref 0.1–1.0)
Monocytes Relative: 16 %
Neutro Abs: 4.4 10*3/uL (ref 1.7–7.7)
Neutrophils Relative %: 68 %
Platelets: 237 10*3/uL (ref 150–400)
RBC: 3 MIL/uL — ABNORMAL LOW (ref 4.22–5.81)
RDW: 13.4 % (ref 11.5–15.5)
WBC: 6.5 10*3/uL (ref 4.0–10.5)
nRBC: 0 % (ref 0.0–0.2)

## 2021-08-06 LAB — URINALYSIS, MICROSCOPIC (REFLEX): RBC / HPF: NONE SEEN RBC/hpf (ref 0–5)

## 2021-08-06 LAB — URINALYSIS, ROUTINE W REFLEX MICROSCOPIC
Bilirubin Urine: NEGATIVE
Glucose, UA: NEGATIVE mg/dL
Hgb urine dipstick: NEGATIVE
Ketones, ur: NEGATIVE mg/dL
Nitrite: NEGATIVE
Protein, ur: NEGATIVE mg/dL
Specific Gravity, Urine: 1.01 (ref 1.005–1.030)
pH: 5.5 (ref 5.0–8.0)

## 2021-08-06 LAB — BASIC METABOLIC PANEL
Anion gap: 7 (ref 5–15)
BUN: 94 mg/dL — ABNORMAL HIGH (ref 8–23)
CO2: 22 mmol/L (ref 22–32)
Calcium: 9.4 mg/dL (ref 8.9–10.3)
Chloride: 108 mmol/L (ref 98–111)
Creatinine, Ser: 2.37 mg/dL — ABNORMAL HIGH (ref 0.61–1.24)
GFR, Estimated: 26 mL/min — ABNORMAL LOW (ref >60)
Glucose, Bld: 175 mg/dL — ABNORMAL HIGH (ref 70–99)
Potassium: 4.8 mmol/L (ref 3.5–5.1)
Sodium: 137 mmol/L (ref 135–145)

## 2021-08-06 NOTE — ED Triage Notes (Signed)
Pt bib by wife and wife stated pt bp was 74/48 at home. Pt denies any pain, weakness, dizziness or fatigue. Wife states pt has been more lethargic today.  ?

## 2021-08-06 NOTE — ED Provider Notes (Signed)
?[facility] EMERGENCY DEPARTMENT ?Provider Note ? ? ?CSN: [id number] ?Arrival date & time: 08/06/21  Bosie Helper ? ?  ? ?History ? ?Chief Complaint  ?Patient presents with  ? Hypotension  ? ? ?John Walton is a 88 y.o. male. ? ?Patient is a 88 year old male with past medical history of peripheral vascular disease, diabetes, hypertension, and hyperlipidemia presenting for complaints of hypotension.  Patient states he has had intermittent episodes of hypertension over the last 3 months that he is currently seeing his primary care physician for.  States blood pressure today was 79/43 taken by home nurse.  Arrived to emergency department with blood pressure of 118/50.  Patient denies any signs or symptoms of sepsis including no fevers, chills, nausea, vomiting, diarrhea, coughing, or dysuria.  States he is otherwise feeling in good health.  Denies any lightheadedness or dizziness.  Denies any chest pain or shortness of breath. ? ?The history is provided by the patient. No language interpreter was used.  ? ?  ? ?Home Medications ?Prior to Admission medications   ?Medication Sig Start Date End Date Taking? Authorizing Provider  ?aspirin EC 81 MG tablet Take 1 tablet (81 mg total) by mouth daily. 12/26/20   Thurnell Lose, MD  ?atorvastatin (LIPITOR) 40 MG tablet Take 40 mg by mouth daily.    [provider]  ?Calcium-Vitamin D 500-3.125 MG-MCG TABS Take 1 tablet by mouth in the morning, at noon, and at bedtime.    [provider]  ?carboxymethylcellulose (REFRESH PLUS) 0.5 % SOLN Place 1 drop into both eyes in the morning, at noon, and at bedtime.    [provider]  ?collagenase (SANTYL) ointment Apply 1 application topically every other day. APPLIED TO HEEL WOUND    [provider]  ?ferrous sulfate 325 (65 FE) MG tablet Take 1 tablet (325 mg total) by mouth daily with breakfast. 12/26/20   Thurnell Lose, MD  ?HYDROcodone-Acetaminophen (NORCO PO) Take 1 tablet by mouth every  6 (six) hours as needed (pain).    [provider]  ?latanoprost (XALATAN) 0.005 % ophthalmic solution Place 1 drop into both eyes at bedtime.  03/13/19   [provider]  ?Lifitegrast (Shirley Friar) 5 % SOLN Place 1 drop into both eyes in the morning and at bedtime.    [provider]  ?methimazole (TAPAZOLE) 5 MG tablet Take 1 tablet by mouth daily. 04/04/21   [provider]  ?nutrition supplement, JUVEN, (JUVEN) PACK Take 1 packet by mouth 2 (two) times daily between meals.    [provider]  ?Nutritional Supplements (PROSOURCE) LIQD Give 30 mLs by tube in the morning and at bedtime.    [provider]  ?SIMBRINZA 1-0.2 % SUSP Place 1 drop into both eyes in the morning and at bedtime.  05/16/19   [provider]  ?sucralfate (CARAFATE) 1 GM/10ML suspension Take 10 mLs (1 g total) by mouth 4 (four) times daily -  with meals and at bedtime. 04/03/20   Aline August, MD  ?vitamin B-12 (CYANOCOBALAMIN) 500 MCG tablet Take 1 tablet by mouth 2 (two) times daily. 01/03/21   [provider]  ?Water For Irrigation, Sterile (FREE WATER) SOLN Place 120 mLs into feeding tube 5 (five) times daily. 04/03/20   Aline August, MD  ?Jay Schlichter Oil (TEARS AGAIN) OINT Place 1 application into both eyes in the morning and at bedtime.    [provider]  ?   ? ?Allergies    ?Other and Robitussin  cold cough+ chest [dextromethorphan-guaifenesin]   ? ?Review of Systems   ?Review of Systems  ?Constitutional:  Negative for chills and fever.  ?HENT:  Negative for ear pain and sore throat.   ?Eyes:  Negative for pain and visual disturbance.  ?Respiratory:  Negative for cough and shortness of breath.   ?Cardiovascular:  Negative for chest pain and palpitations.  ?Gastrointestinal:  Negative for abdominal pain and vomiting.  ?Genitourinary:  Negative for dysuria and hematuria.  ?Musculoskeletal:  Negative for arthralgias and back pain.  ?Skin:  Negative for  color change and rash.  ?Neurological:  Negative for seizures and syncope.  ?All other systems reviewed and are negative. ? ?Physical Exam ?Updated Vital Signs ?BP (!) 146/88   Pulse 97   Temp 97.9 ?F (36.6 ?C) (Oral)   Resp (!) 22   Ht '5\' 6"'$  (1.676 m)   Wt 68 kg   SpO2 98%   BMI 24.21 kg/m?  ?Physical Exam ?Vitals and nursing note reviewed.  ?Constitutional:   ?   General: He is not in acute distress. ?   Appearance: He is well-developed.  ?HENT:  ?   Head: Normocephalic and atraumatic.  ?Eyes:  ?   Conjunctiva/sclera: Conjunctivae normal.  ?Cardiovascular:  ?   Rate and Rhythm: Normal rate and regular rhythm.  ?   Heart sounds: No murmur heard. ?Pulmonary:  ?   Effort: Pulmonary effort is normal. No respiratory distress.  ?   Breath sounds: Normal breath sounds.  ?Abdominal:  ?   Palpations: Abdomen is soft.  ?   Tenderness: There is no abdominal tenderness.  ?Musculoskeletal:     ?   General: No swelling.  ?   Cervical back: Neck supple.  ?Skin: ?   General: Skin is warm and dry.  ?   Capillary Refill: Capillary refill takes less than 2 seconds.  ?Neurological:  ?   Mental Status: He is alert and oriented to person, place, and time.  ?   GCS: GCS eye subscore is 4. GCS verbal subscore is 5. GCS motor subscore is 6.  ?Psychiatric:     ?   Mood and Affect: Mood normal.  ? ? ?ED Results / Procedures / Treatments   ?Labs ?(all labs ordered are listed, but only abnormal results are displayed) ?Labs Reviewed  ?CBC WITH DIFFERENTIAL/PLATELET - Abnormal; Notable for the following components:  ?    Result Value  ? RBC 3.00 (*)   ? Hemoglobin 8.5 (*)   ? HCT 27.1 (*)   ? Monocytes Absolute 1.1 (*)   ? All other components within normal limits  ?BASIC METABOLIC PANEL - Abnormal; Notable for the following components:  ? Glucose, Bld 175 (*)   ? BUN 94 (*)   ? Creatinine, Ser 2.37 (*)   ? GFR, Estimated 26 (*)   ? All other components within normal limits  ?URINALYSIS, ROUTINE W REFLEX MICROSCOPIC - Abnormal; Notable  for the following components:  ? Leukocytes,Ua TRACE (*)   ? All other components within normal limits  ?URINALYSIS, MICROSCOPIC (REFLEX) - Abnormal; Notable for the following components:  ? Bacteria, UA RARE (*)   ? All other components within normal limits  ? ? ?EKG ?None ? ?Radiology ?No results found. ? ?Procedures ?Procedures  ? ? ?Medications Ordered in ED ?Medications - No data to display ? ?ED Course/ Medical Decision Making/ A&P ?  ?                        ?  Medical Decision Making ?Amount and/or Complexity of Data Reviewed ?Labs: ordered. ? ? ?23:66 PM ?88 year old male with past medical history of peripheral vascular disease, diabetes, hypertension, and hyperlipidemia presenting for complaints of hypotension.  Patient is alert and oriented x3, no acute distress, afebrile, stable vital signs.   ? ?No alarming findings on physical exam. ? ?ECG stable with no ST segment elevation or depression.  Stable intervals.  Rate 92.  No chest pain or shortness of breath.  Doubt cardiac etiology. ? ?Stable electrolytes. ? ? Patient currently increasing fluid intake including drinking 60 ounces of fluids a day.  Clinical signs of dehydration.  No laboratory findings of dehydration.  Renal function stable with previous studies.   ? ?No signs or symptoms of sepsis.  UA demonstrates no UTI. ? ?Patient has chronic anemia that is stable with his previous laboratory studies.  Denies any active bleeding.  Likely secondary to chronic kidney disease. ? ?Stable orthostatics. ? ?Patient presenting with wife for hypotension that occurred in the home however blood pressures have all been stable including reading from 118/50 to 146/88 while emergency department today.  No clear etiology was found during visit today.  Recommended for close follow-up with PCP for monitoring and management of hypertension.   ? ?Patient in no distress and overall condition improved here in the ED. Detailed discussions were had with the patient regarding  current findings, and need for close f/u with PCP or on call doctor. The patient has been instructed to return immediately if the symptoms worsen in any way for re-evaluation. Patient verbalized understanding an

## 2021-08-06 NOTE — ED Notes (Signed)
Pt BIB spouse for hypotension. Spouse states she checks pt BP every day and today was 79/43 at home. Pt pale, a/ox4, states he went on 15 minute walk today and felt fine, denies dizziness, syncope, CP, SOB, n/v/d, ABD pain.  ?

## 2021-08-06 NOTE — ED Notes (Signed)
Pt NAD, a/ox4. Pt verbalizes understanding of all DC and f/u instructions. All questions answered. Pt assisted into w/c and wheeled to parking lot and assisted into vehicle by this RN. ? ?

## 2021-09-13 ENCOUNTER — Ambulatory Visit (HOSPITAL_COMMUNITY)
Admission: RE | Admit: 2021-09-13 | Discharge: 2021-09-13 | Disposition: A | Discharge status: Home / Self Care | Payer: Medicare Other | Source: Ambulatory Visit | Attending: Vascular Surgery | Admitting: Vascular Surgery

## 2021-09-13 ENCOUNTER — Ambulatory Visit (INDEPENDENT_AMBULATORY_CARE_PROVIDER_SITE_OTHER): Payer: Medicare Other | Admitting: Vascular Surgery

## 2021-09-13 ENCOUNTER — Encounter: Payer: Self-pay | Admitting: Vascular Surgery

## 2021-09-13 VITALS — BP 121/71 | HR 82 | Temp 98.1°F | Resp 20 | Ht 66.0 in | Wt 150.0 lb

## 2021-09-13 DIAGNOSIS — R0989 Other specified symptoms and signs involving the circulatory and respiratory systems: Secondary | ICD-10-CM | POA: Insufficient documentation

## 2021-09-13 DIAGNOSIS — I6521 Occlusion and stenosis of right carotid artery: Secondary | ICD-10-CM | POA: Diagnosis not present

## 2021-09-13 NOTE — Progress Notes (Signed)
REASON FOR VISIT:   Follow-up after right carotid endarterectomy  MEDICAL ISSUES:   RIGHT CAROTID STENOSIS: This patient underwent a right carotid endarterectomy with primary closure on 12/22/2020 for asymptomatic right carotid stenosis.  He was due for a 74-monthfollow-up visit.  He has no evidence of recurrent stenosis on the right and no significant disease on the left.  I have ordered a follow-up carotid duplex scan in 1 year and I will see him back at that time.  He is on aspirin and is on a statin.  HPI:   EBASIM BARTNIKis a pleasant 88y.o. male who I saw on 08/02/2021 with a right heel ulcer.  He had developed a right heel ulcer after hip surgery in December 2021.  At the time of his last visit this wound had healed essentially.  There is a photograph in the chart from the note on that day.  Toe pressure on the right was 125 mmHg and I did not think any further arterial work-up was indicated.  He had been having some significant leg swelling.  We discussed the importance of leg elevation.  Patient is also undergone a right carotid endarterectomy with primary closure on 12/22/2020 for a right brain stroke.  He had no significant disease on the left.  He was due for his follow-up 960-monthuplex scan in June of this year and he was set up for a follow-up visit at this time.  Since I saw him last, he denies any history of stroke, TIAs, expressive or receptive aphasia, or amaurosis fugax.  He is on aspirin and is on a statin.  He has had some issues intermittently with hypotension.  This is usually asymptomatic.  Past Medical History:  Diagnosis Date   Anemia    Carotid artery occlusion    Chicken pox    Chronic kidney disease    Coronary artery disease    Diabetes mellitus without complication (HCC)    Frequent headaches    Glaucoma    Hay fever    History of blood transfusion    HTN (hypertension)    Hyperlipidemia    Hyperlipidemia    Hypothyroidism    Migraines     Peripheral vascular disease ([facility]   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([facility]    Family History  Problem Relation Age of Onset   Liver disease Mother    Skin cancer Father    Skin cancer Paternal Grandfather    Diabetes Maternal Grandfather     SOCIAL HISTORY: Social History   Tobacco Use   Smoking status: Former    Types: Cigarettes    Start date: 04/01/1968    Quit date: 01/11/1977    Years since quitting: 44.7   Smokeless tobacco: Never  Substance Use Topics   Alcohol use: Not Currently    Allergies  Allergen Reactions   Other Swelling    ALL COUGH MEDICATIONS   Robitussin Cold Cough+ Chest [Dextromethorphan-Guaifenesin] Swelling    Laryngeal edema - any cough syrup    Current Outpatient Medications  Medication Sig Dispense Refill   aspirin EC 81 MG tablet Take 1 tablet (81 mg total) by mouth daily. 30 tablet 0   atorvastatin (LIPITOR) 40 MG tablet Take 40 mg by mouth daily.     Calcium-Vitamin D 500-3.125 MG-MCG TABS Take 1 tablet by mouth in the morning, at noon, and at bedtime.     carboxymethylcellulose (REFRESH PLUS) 0.5 % SOLN Place 1 drop into both  eyes in the morning, at noon, and at bedtime.     collagenase (SANTYL) ointment Apply 1 application topically every other day. APPLIED TO HEEL WOUND     ferrous sulfate 325 (65 FE) MG tablet Take 1 tablet (325 mg total) by mouth daily with breakfast. 30 tablet 0   HYDROcodone-Acetaminophen (NORCO PO) Take 1 tablet by mouth every 6 (six) hours as needed (pain).     latanoprost (XALATAN) 0.005 % ophthalmic solution Place 1 drop into both eyes at bedtime.      Lifitegrast (XIIDRA) 5 % SOLN Place 1 drop into both eyes in the morning and at bedtime.     methimazole (TAPAZOLE) 5 MG tablet Take 1 tablet by mouth daily.     nutrition supplement, JUVEN, (JUVEN) PACK Take 1 packet by mouth 2 (two) times daily between meals.     Nutritional Supplements (PROSOURCE) LIQD Give 30 mLs by tube in the morning and at bedtime.      SIMBRINZA 1-0.2 % SUSP Place 1 drop into both eyes in the morning and at bedtime.      sucralfate (CARAFATE) 1 GM/10ML suspension Take 10 mLs (1 g total) by mouth 4 (four) times daily -  with meals and at bedtime. 420 mL 0   vitamin B-12 (CYANOCOBALAMIN) 500 MCG tablet Take 1 tablet by mouth 2 (two) times daily.     Water For Irrigation, Sterile (FREE WATER) SOLN Place 120 mLs into feeding tube 5 (five) times daily. 1000 mL 15   White Petrolatum-Mineral Oil (TEARS AGAIN) OINT Place 1 application into both eyes in the morning and at bedtime.     No current facility-administered medications for this visit.    REVIEW OF SYSTEMS:  '[X]'$  denotes positive finding, '[ ]'$  denotes negative finding Cardiac  Comments:  Chest pain or chest pressure:    Shortness of breath upon exertion:    Short of breath when lying flat:    Irregular heart rhythm:        Vascular    Pain in calf, thigh, or hip brought on by ambulation:    Pain in feet at night that wakes you up from your sleep:     Blood clot in your veins:    Leg swelling:         Pulmonary    Oxygen at home:    Productive cough:     Wheezing:         Neurologic    Sudden weakness in arms or legs:     Sudden numbness in arms or legs:     Sudden onset of difficulty speaking or slurred speech:    Temporary loss of vision in one eye:     Problems with dizziness:         Gastrointestinal    Blood in stool:     Vomited blood:         Genitourinary    Burning when urinating:     Blood in urine:        Psychiatric    Major depression:         Hematologic    Bleeding problems:    Problems with blood clotting too easily:        Skin    Rashes or ulcers:        Constitutional    Fever or chills:     PHYSICAL EXAM:   Vitals:   09/13/21 1538 09/13/21 1540  BP: 116/71 121/71  Pulse: 82   Resp:  20   Temp: 98.1 F (36.7 C)   SpO2: 98%   Weight: 150 lb (68 kg)   Height: '5\' 6"'$  (1.676 m)     GENERAL: The patient is a  well-nourished male, in no acute distress. The vital signs are documented above. CARDIAC: There is a regular rate and rhythm.  VASCULAR: I do not detect carotid bruits. PULMONARY: There is good air exchange bilaterally without wheezing or rales. MUSCULOSKELETAL: There are no major deformities or cyanosis. NEUROLOGIC: No focal weakness or paresthesias are detected. SKIN: He has a small wound on his right heel.    PSYCHIATRIC: The patient has a normal affect.  DATA:    CAROTID DUPLEX: I have independently interpreted his carotid duplex scan today.  On the right side, his right carotid endarterectomy site is widely patent.  There is no evidence of recurrent carotid stenosis.  He has a less than 39% left carotid stenosis.  Deitra Mayo [facility] [phone]

## 2021-10-07 NOTE — Progress Notes (Unsigned)
Cardiology Office Note   Date:  10/07/2021   ID:  John Walton, DOB [birth year], MRN [id number]  PCP:  [name], MD    No chief complaint on file.  PAD  Wt Readings from Last 3 Encounters:  09/13/21 150 lb (68 kg)  08/06/21 150 lb (68 kg)  08/02/21 152 lb (68.9 kg)       History of Present Illness: John Walton is a 88 y.o. male  with a hx of hypertension, hyperlipidemia, DM2, CKD IV, migraine, hypothyroidism, chronic anemia, CVA, bifascicular heart block, carotid artery disease s/p right CEA.  Prior records show: "He was admitted for right vision changes and left arm weakness and numbness found of acute CVA.  He was seen by vascular surgery during admission for symptomatic right carotid stenosis greater than 80% and right carotid endarterectomy was performed.  Echo 12/20/2018 LVEF 55 to 60%, hypokinesis of LV, apical inferior wall, trivial MR, trivial AR, mild to moderate AS with aortic valve area, by VTI measures 1.93 cm. Aortic valve mean gradient measures 16.0 mmHg. Aortic valve Vmax measures 2.49 m/s. mild dilatation of the aortic root 42 mm, mild dilatation of the ascending aorta 41 mm.   He was seen 01/11/21 doing overall well since discharge. Still with some mobility limitation to right arm though weakness improved. Carvedilol was discontinued due to hypotension.    Seen in ED 02/23/21 due to hypotension. His creatinine was slightly increased and dehydration thought to be contributory. He was provided IVF. Urinalysis with moderate leukocytes and given 1g IV Rocephin and discharged with Keflex."  Past Medical History:  Diagnosis Date   Anemia    Carotid artery occlusion    Chicken pox    Chronic kidney disease    Coronary artery disease    Diabetes mellitus without complication (HCC)    Frequent headaches    Glaucoma    Hay fever    History of blood transfusion    HTN (hypertension)    Hyperlipidemia    Hyperlipidemia    Hypothyroidism     Migraines    Peripheral vascular disease ([place])    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[facility])     Past Surgical History:  Procedure Laterality Date   CHOLECYSTECTOMY, LAPAROSCOPIC     ENDARTERECTOMY Right 12/22/2020   Procedure: RIGHT CAROTID ENDARTERECTOMY;  Surgeon: Angelia Mould, MD;  Location: [facility] OR;  Service: Vascular;  Laterality: Right;   IR GASTROSTOMY TUBE MOD SED  03/29/2020   TONSILLECTOMY  04/01/1940   TOTAL HIP ARTHROPLASTY Left 03/08/2020   Procedure: TOTAL HIP ARTHROPLASTY;  Surgeon: Marybelle Killings, MD;  Location: [facility] ORS;  Service: Orthopedics;  Laterality: Left;   TRANSURETHRAL RESECTION OF PROSTATE N/A 12/14/2020   Procedure: TRANSURETHRAL RESECTION OF THE PROSTATE (TURP);  Surgeon: Franchot Gallo, MD;  Location: [facility] ORS;  Service: Urology;  Laterality: N/A;  2 HRS     Current Outpatient Medications  Medication Sig Dispense Refill   aspirin EC 81 MG tablet Take 1 tablet (81 mg total) by mouth daily. 30 tablet 0   atorvastatin (LIPITOR) 40 MG tablet Take 40 mg by mouth daily.     Calcium-Vitamin D 500-3.125 MG-MCG TABS Take 1 tablet by mouth in the morning, at noon, and at bedtime.     carboxymethylcellulose (REFRESH PLUS) 0.5 % SOLN Place 1 drop into both eyes in the morning, at noon, and at bedtime.     collagenase (SANTYL) ointment Apply 1 application topically every other day. APPLIED  TO HEEL WOUND     ferrous sulfate 325 (65 FE) MG tablet Take 1 tablet (325 mg total) by mouth daily with breakfast. 30 tablet 0   HYDROcodone-Acetaminophen (NORCO PO) Take 1 tablet by mouth every 6 (six) hours as needed (pain).     latanoprost (XALATAN) 0.005 % ophthalmic solution Place 1 drop into both eyes at bedtime.      Lifitegrast (XIIDRA) 5 % SOLN Place 1 drop into both eyes in the morning and at bedtime.     methimazole (TAPAZOLE) 5 MG tablet Take 1 tablet by mouth daily.     nutrition supplement, JUVEN, (JUVEN) PACK Take 1 packet by mouth 2 (two) times daily between  meals.     Nutritional Supplements (PROSOURCE) LIQD Give 30 mLs by tube in the morning and at bedtime.     SIMBRINZA 1-0.2 % SUSP Place 1 drop into both eyes in the morning and at bedtime.      sucralfate (CARAFATE) 1 GM/10ML suspension Take 10 mLs (1 g total) by mouth 4 (four) times daily -  with meals and at bedtime. 420 mL 0   vitamin B-12 (CYANOCOBALAMIN) 500 MCG tablet Take 1 tablet by mouth 2 (two) times daily.     Water For Irrigation, Sterile (FREE WATER) SOLN Place 120 mLs into feeding tube 5 (five) times daily. 1000 mL 15   White Petrolatum-Mineral Oil (TEARS AGAIN) OINT Place 1 application into both eyes in the morning and at bedtime.     No current facility-administered medications for this visit.    Allergies:   Other and Robitussin cold cough+ chest [dextromethorphan-guaifenesin]    Social History:  The patient  reports that he quit smoking about 44 years ago. His smoking use included cigarettes. He started smoking about 53 years ago. He has never used smokeless tobacco. He reports that he does not currently use alcohol. He reports that he does not use drugs.   Family History:  The patient's ***family history includes Diabetes in his maternal grandfather; Liver disease in his mother; Skin cancer in his father and paternal grandfather.    ROS:  Please see the history of present illness.   Otherwise, review of systems are positive for ***.   All other systems are reviewed and negative.    PHYSICAL EXAM: VS:  There were no vitals taken for this visit. , BMI There is no height or weight on file to calculate BMI. GEN: Well nourished, well developed, in no acute distress HEENT: normal Neck: no JVD, carotid bruits, or masses Cardiac: ***RRR; no murmurs, rubs, or gallops,no edema  Respiratory:  clear to auscultation bilaterally, normal work of breathing GI: soft, nontender, nondistended, + BS MS: no deformity or atrophy Skin: warm and dry, no rash Neuro:  Strength and sensation  are intact Psych: euthymic mood, full affect   EKG:   The ekg ordered today demonstrates ***   Recent Labs: 12/19/2020: TSH 1.259 02/23/2021: ALT 16 08/06/2021: BUN 94; Creatinine, Ser 2.37; Hemoglobin 8.5; Platelets 237; Potassium 4.8; Sodium 137   Lipid Panel    Component Value Date/Time   CHOL 114 12/19/2020 0742   TRIG 99 12/19/2020 0742   HDL 36 (L) 12/19/2020 0742   CHOLHDL 3.2 12/19/2020 0742   VLDL 20 12/19/2020 0742   LDLCALC 58 12/19/2020 0742   LDLDIRECT 137.0 07/11/2014 0850     Other studies Reviewed: Additional studies/ records that were reviewed today with results demonstrating: ***.   ASSESSMENT AND PLAN:  HTN:  PAD: s/p  CEA.  h/o CVA Hyperlipidemia: DM2: CKD IV: Mild to mod AS:   Current medicines are reviewed at length with the patient today.  The patient concerns regarding his medicines were addressed.  The following changes have been made:  No change***  Labs/ tests ordered today include: *** No orders of the defined types were placed in this encounter.   Recommend 150 minutes/week of aerobic exercise Low fat, low carb, high fiber diet recommended  Disposition:   FU in ***   Signed, Larae Grooms, MD  10/07/2021 10:54 PM    [facility] [address], [place], [place]  [address] Phone: [phone]; Fax: [phone]

## 2021-10-08 ENCOUNTER — Encounter: Payer: Self-pay | Admitting: Interventional Cardiology

## 2021-10-08 ENCOUNTER — Ambulatory Visit (INDEPENDENT_AMBULATORY_CARE_PROVIDER_SITE_OTHER): Payer: Medicare Other | Admitting: Interventional Cardiology

## 2021-10-08 VITALS — BP 98/58 | HR 83 | Ht 66.0 in | Wt 149.6 lb

## 2021-10-08 DIAGNOSIS — N184 Chronic kidney disease, stage 4 (severe): Secondary | ICD-10-CM | POA: Diagnosis not present

## 2021-10-08 DIAGNOSIS — I350 Nonrheumatic aortic (valve) stenosis: Secondary | ICD-10-CM | POA: Diagnosis not present

## 2021-10-08 DIAGNOSIS — E782 Mixed hyperlipidemia: Secondary | ICD-10-CM | POA: Diagnosis not present

## 2021-10-08 DIAGNOSIS — I6521 Occlusion and stenosis of right carotid artery: Secondary | ICD-10-CM | POA: Diagnosis not present

## 2021-10-08 DIAGNOSIS — E118 Type 2 diabetes mellitus with unspecified complications: Secondary | ICD-10-CM

## 2021-10-08 NOTE — Patient Instructions (Signed)

## 2021-11-12 ENCOUNTER — Telehealth (HOSPITAL_BASED_OUTPATIENT_CLINIC_OR_DEPARTMENT_OTHER): Payer: Self-pay

## 2021-11-12 NOTE — Telephone Encounter (Signed)
   Patient Name: John Walton  DOB: 1932/11/09 MRN: [id number]  Primary Cardiologist: Larae Grooms, MD  Chart reviewed as part of pre-operative protocol coverage. Patient has upcoming retinal surgery planned and we were asked to give our recommendations for holding Aspirin. He was recently seen by Dr. Irish Lack on 10/08/2021 at which time he was stable from a cardiac standpoint with no chest pain, shortness of breath, orthopnea, PND, lower extremity edema, palpitations, or syncope. He was continuing to ambulate with a walker. He was advised to follow-up in 1 year. From a cardiac standpoint, we recommend continuation of Aspirin throughout the perioperative period.  However, if the surgeon feels that cessation of Aspirin is required in the perioperative period, it may be stopped 5-7 days prior to surgery with a plan to resume it as soon as felt to be feasible from a surgical standpoint in the post-operative period. However, patient also has a history of carotid artery disease and prior stroke s/p right CEA. This is followed by Vascular Surgery (Dr. Scot Dock). Therefore, I would also recommended touching base with Vascular Surgery or PCP for recommendations on holding Aspirin.  I will route this recommendation to the requesting party via Epic fax function and remove from pre-op pool.  Please call with questions.  Darreld Mclean, PA-C 11/12/2021, 2:09 PM

## 2021-11-12 NOTE — Telephone Encounter (Signed)
   Pre-operative Risk Assessment    Patient Name: John Walton  DOB: 10-21-1932 MRN: [id number]      Request for Surgical Clearance    Procedure:   Retinal Surgery  Date of Surgery:  Clearance 11/19/21                                 Surgeon:  Lorrene Reid, MD Surgeon's Group or Practice Name:  [facility] Phone number:  [phone] Fax number:  [phone]   Type of Clearance Requested:   - Pharmacy:  Hold Aspirin 7 days prior   Type of Anesthesia:  MAC   Additional requests/questions:   None  [name]   11/12/2021, 1:40 PM

## 2021-11-13 NOTE — Progress Notes (Signed)
John Walton with [facility] called requesting pt. To be cleared to hold his [name] 7 days prior to his retine procedure scheduled on 11/19/2021. I spoke and reviewed pt's chart with our in house PA [facility]). Per our in house PA (Samantha Venice, [place]) John Walton is cleared to hold Aspirin 7 days prior to procedure. Patient should resume Aspirin at the discretion of Retina Surgeon. Correspondence also faxed to [phone].

## 2021-12-20 ENCOUNTER — Other Ambulatory Visit (HOSPITAL_BASED_OUTPATIENT_CLINIC_OR_DEPARTMENT_OTHER): Payer: Medicare Other

## 2021-12-20 ENCOUNTER — Ambulatory Visit (INDEPENDENT_AMBULATORY_CARE_PROVIDER_SITE_OTHER): Payer: Medicare Other

## 2021-12-20 DIAGNOSIS — I350 Nonrheumatic aortic (valve) stenosis: Secondary | ICD-10-CM | POA: Diagnosis not present

## 2021-12-23 LAB — ECHOCARDIOGRAM COMPLETE
AR max vel: 0.84 cm2
AV Area VTI: 0.95 cm2
AV Area mean vel: 0.79 cm2
AV Mean grad: 15.3 mmHg
AV Peak grad: 25.4 mmHg
Ao pk vel: 2.52 m/s
Area-P 1/2: 5.09 cm2
S' Lateral: 2.39 cm

## 2021-12-24 ENCOUNTER — Other Ambulatory Visit (HOSPITAL_BASED_OUTPATIENT_CLINIC_OR_DEPARTMENT_OTHER): Payer: Self-pay

## 2021-12-24 DIAGNOSIS — I10 Essential (primary) hypertension: Secondary | ICD-10-CM

## 2021-12-25 ENCOUNTER — Telehealth (HOSPITAL_BASED_OUTPATIENT_CLINIC_OR_DEPARTMENT_OTHER): Payer: Self-pay

## 2021-12-25 NOTE — Telephone Encounter (Addendum)
Called results to patient and left results on VM (ok per DPR), instructions left to call office back if patient has any questions!     ----- Message from Loel Dubonnet, NP sent at 12/24/2021  7:53 AM EDT ----- Echo with normal heart muscle function.  Moderate thickness of the heart muscle.  Right ventricle mildly enlarged.  Mild stenosis of the aortic valve.  Overall stable compared to previous.  Continue optimal blood pressure control.  Repeat echocardiogram in 1 year for monitoring.

## 2022-01-16 ENCOUNTER — Encounter (HOSPITAL_COMMUNITY): Payer: Self-pay | Admitting: Emergency Medicine

## 2022-01-16 ENCOUNTER — Emergency Department (HOSPITAL_COMMUNITY)
Admission: EM | Admit: 2022-01-16 | Discharge: 2022-01-16 | Disposition: A | Discharge status: Home / Self Care | Payer: No Typology Code available for payment source | Attending: Emergency Medicine | Admitting: Emergency Medicine

## 2022-01-16 DIAGNOSIS — Z7982 Long term (current) use of aspirin: Secondary | ICD-10-CM | POA: Diagnosis not present

## 2022-01-16 DIAGNOSIS — I129 Hypertensive chronic kidney disease with stage 1 through stage 4 chronic kidney disease, or unspecified chronic kidney disease: Secondary | ICD-10-CM | POA: Diagnosis not present

## 2022-01-16 DIAGNOSIS — I2510 Atherosclerotic heart disease of native coronary artery without angina pectoris: Secondary | ICD-10-CM | POA: Diagnosis not present

## 2022-01-16 DIAGNOSIS — E860 Dehydration: Secondary | ICD-10-CM

## 2022-01-16 DIAGNOSIS — E039 Hypothyroidism, unspecified: Secondary | ICD-10-CM | POA: Insufficient documentation

## 2022-01-16 DIAGNOSIS — N189 Chronic kidney disease, unspecified: Secondary | ICD-10-CM | POA: Diagnosis not present

## 2022-01-16 DIAGNOSIS — I959 Hypotension, unspecified: Secondary | ICD-10-CM | POA: Insufficient documentation

## 2022-01-16 DIAGNOSIS — E119 Type 2 diabetes mellitus without complications: Secondary | ICD-10-CM | POA: Diagnosis not present

## 2022-01-16 LAB — URINALYSIS, ROUTINE W REFLEX MICROSCOPIC
Bacteria, UA: NONE SEEN
Bilirubin Urine: NEGATIVE
Glucose, UA: 50 mg/dL — AB
Hgb urine dipstick: NEGATIVE
Ketones, ur: NEGATIVE mg/dL
Nitrite: NEGATIVE
Protein, ur: NEGATIVE mg/dL
Specific Gravity, Urine: 1.01 (ref 1.005–1.030)
pH: 6 (ref 5.0–8.0)

## 2022-01-16 LAB — CBC WITH DIFFERENTIAL/PLATELET
Abs Immature Granulocytes: 0.05 10*3/uL (ref 0.00–0.07)
Basophils Absolute: 0 10*3/uL (ref 0.0–0.1)
Basophils Relative: 0 %
Eosinophils Absolute: 0.2 10*3/uL (ref 0.0–0.5)
Eosinophils Relative: 4 %
HCT: 28.7 % — ABNORMAL LOW (ref 39.0–52.0)
Hemoglobin: 9.2 g/dL — ABNORMAL LOW (ref 13.0–17.0)
Immature Granulocytes: 1 %
Lymphocytes Relative: 12 %
Lymphs Abs: 0.7 10*3/uL (ref 0.7–4.0)
MCH: 29.7 pg (ref 26.0–34.0)
MCHC: 32.1 g/dL (ref 30.0–36.0)
MCV: 92.6 fL (ref 80.0–100.0)
Monocytes Absolute: 0.7 10*3/uL (ref 0.1–1.0)
Monocytes Relative: 13 %
Neutro Abs: 4.2 10*3/uL (ref 1.7–7.7)
Neutrophils Relative %: 70 %
Platelets: 241 10*3/uL (ref 150–400)
RBC: 3.1 MIL/uL — ABNORMAL LOW (ref 4.22–5.81)
RDW: 12.9 % (ref 11.5–15.5)
WBC: 5.9 10*3/uL (ref 4.0–10.5)
nRBC: 0 % (ref 0.0–0.2)

## 2022-01-16 LAB — COMPREHENSIVE METABOLIC PANEL
ALT: 17 U/L (ref 0–44)
AST: 18 U/L (ref 15–41)
Albumin: 3.4 g/dL — ABNORMAL LOW (ref 3.5–5.0)
Alkaline Phosphatase: 86 U/L (ref 38–126)
Anion gap: 7 (ref 5–15)
BUN: 91 mg/dL — ABNORMAL HIGH (ref 8–23)
CO2: 24 mmol/L (ref 22–32)
Calcium: 9 mg/dL (ref 8.9–10.3)
Chloride: 107 mmol/L (ref 98–111)
Creatinine, Ser: 2.28 mg/dL — ABNORMAL HIGH (ref 0.61–1.24)
GFR, Estimated: 27 mL/min — ABNORMAL LOW (ref >60)
Glucose, Bld: 325 mg/dL — ABNORMAL HIGH (ref 70–99)
Potassium: 4.9 mmol/L (ref 3.5–5.1)
Sodium: 138 mmol/L (ref 135–145)
Total Bilirubin: 0.5 mg/dL (ref 0.3–1.2)
Total Protein: 6.8 g/dL (ref 6.5–8.1)

## 2022-01-16 LAB — TSH: TSH: 6.116 u[IU]/mL — ABNORMAL HIGH (ref 0.350–4.500)

## 2022-01-16 LAB — CBG MONITORING, ED: Glucose-Capillary: 189 mg/dL — ABNORMAL HIGH (ref 70–99)

## 2022-01-16 MED ORDER — SODIUM CHLORIDE 0.9 % IV BOLUS
1000.0000 mL | Freq: Once | INTRAVENOUS | Status: AC
  Administered 2022-01-16: 1000 mL via INTRAVENOUS

## 2022-01-16 MED ORDER — INSULIN ASPART 100 UNIT/ML IJ SOLN
5.0000 [IU] | Freq: Once | INTRAMUSCULAR | Status: DC
  Filled 2022-01-16: qty 0.05

## 2022-01-16 NOTE — ED Triage Notes (Signed)
Pt arriving for hypotension. Sent by provider at the [place]. Wife reports pt has not had adequate fluid intake. No other symptoms

## 2022-01-16 NOTE — Discharge Instructions (Signed)
I think your low blood pressure was secondary to dehydration.  Please attempt to drink more water or take more water through your G-tube daily.  Follow-up with your primary care physician for further recommendations.

## 2022-01-16 NOTE — ED Provider Notes (Signed)
[facility] DEPT Provider Note   CSN: [id number] Arrival date & time: 01/16/22  1254     History  Chief Complaint  Patient presents with   Hypotension    John Walton is a 88 y.o. male.  Patient is an 88 year old male with past medical history of chronic anemia, chronic kidney disease, coronary artery disease, hypertension, hypothyroidism, diabetes presenting for complaints of hypotension.  Patient states several months ago he was admitted to the hospital for anaphylaxis reaction for a Robitussin allergy.  States it took him 3 months to get function of his throat and had to have a G-tube placed at that time.  States he is still using the G-tube for nutrition however is able to swallow and tolerate food by mouth.  States he has had a decreased appetite over the last few weeks and has decreased oral intake of fluids.  Outside of that patient denies any other symptoms and states he is otherwise feeling well.  He denies any fevers, chills, nausea, vomiting, diarrhea.  Denies sick contacts.  Denies any bleeding including no black or bloody stools.  Denies any feelings of lightheadedness, dizziness, chest pain, palpitations, or syncope.  The history is provided by the patient. No language interpreter was used.       Home Medications Prior to Admission medications   Medication Sig Start Date End Date Taking? Authorizing Provider  acetaminophen (TYLENOL) 325 MG tablet Take by mouth as needed for pain.    [provider]  aspirin EC 81 MG tablet Take 1 tablet (81 mg total) by mouth daily. 12/26/20   Thurnell Lose, MD  atorvastatin (LIPITOR) 40 MG tablet Take 40 mg by mouth daily.    [provider]  Calcium-Vitamin D 500-3.125 MG-MCG TABS Take 1 tablet by mouth in the morning, at noon, and at bedtime.    [provider]  carboxymethylcellulose (REFRESH PLUS) 0.5 % SOLN Place 1 drop into both eyes 6 (six) times daily.    [provider]  collagenase (SANTYL) ointment Apply 1 application topically every other day. APPLIED TO HEEL WOUND    [provider]  ferrous sulfate 325 (65 FE) MG tablet Take 1 tablet (325 mg total) by mouth daily with breakfast. 12/26/20   Thurnell Lose, MD  HYDROcodone-Acetaminophen (NORCO PO) Take 1 tablet by mouth every 6 (six) hours as needed (pain).    [provider]  latanoprost (XALATAN) 0.005 % ophthalmic solution Place 1 drop into both eyes at bedtime.  03/13/19   [provider]  Lifitegrast Shirley Friar) 5 % SOLN Place 1 drop into both eyes in the morning and at bedtime.    [provider]  methimazole (TAPAZOLE) 10 MG tablet Take 10 mg by mouth daily.    [provider]  nutrition supplement, JUVEN, (JUVEN) PACK Take 1 packet by mouth 2 (two) times daily between meals.    [provider]  Nutritional Supplements (PROSOURCE) LIQD Give 30 mLs by tube in the morning and at bedtime.    [provider]  pantoprazole (PROTONIX) 40 MG tablet as needed for heartburn.    [provider]  SIMBRINZA 1-0.2 % SUSP Place 1 drop into both eyes in the morning and at bedtime.  05/16/19   [provider]  vitamin B-12 (CYANOCOBALAMIN) 500 MCG tablet Take 1 tablet by mouth 2 (two) times daily. 01/03/21   [provider]  Water For Irrigation, Sterile (FREE WATER) SOLN Place 120 mLs into  feeding tube 5 (five) times daily. 04/03/20   Aline August, MD  White Petrolatum-Mineral Oil (TEARS AGAIN) OINT Place 1 application into both eyes in the morning and at bedtime.    [provider]      Allergies    Other, Influenza vaccines, and Robitussin cold cough+ chest [dextromethorphan-guaifenesin]    Review of Systems   Review of Systems  Constitutional:  Negative for chills and fever.  HENT:  Negative for ear pain and sore throat.   Eyes:  Negative for pain and visual disturbance.  Respiratory:  Negative for  cough and shortness of breath.   Cardiovascular:  Negative for chest pain and palpitations.  Gastrointestinal:  Negative for abdominal pain and vomiting.  Genitourinary:  Negative for dysuria and hematuria.  Musculoskeletal:  Negative for arthralgias and back pain.  Skin:  Negative for color change and rash.  Neurological:  Negative for seizures and syncope.  All other systems reviewed and are negative.   Physical Exam Updated Vital Signs BP 138/61 (BP Location: Right Arm)   Pulse 87   Temp 98.1 F (36.7 C) (Oral)   Resp 18   Ht '5\' 6"'$  (1.676 m)   Wt 68 kg   SpO2 99%   BMI 24.21 kg/m  Physical Exam Vitals and nursing note reviewed.  Constitutional:      General: He is not in acute distress.    Appearance: He is well-developed.  HENT:     Head: Normocephalic and atraumatic.     Mouth/Throat:     Mouth: Mucous membranes are dry.  Eyes:     Conjunctiva/sclera: Conjunctivae normal.  Cardiovascular:     Rate and Rhythm: Normal rate and regular rhythm.     Heart sounds: No murmur heard. Pulmonary:     Effort: Pulmonary effort is normal. No respiratory distress.     Breath sounds: Normal breath sounds.  Abdominal:     Palpations: Abdomen is soft.     Tenderness: There is no abdominal tenderness.  Musculoskeletal:        General: No swelling.     Cervical back: Neck supple.  Skin:    General: Skin is dry.     Capillary Refill: Capillary refill takes more than 3 seconds.  Neurological:     Mental Status: He is alert and oriented to person, place, and time.     GCS: GCS eye subscore is 4. GCS verbal subscore is 5. GCS motor subscore is 6.  Psychiatric:        Mood and Affect: Mood normal.     ED Results / Procedures / Treatments   Labs (all labs ordered are listed, but only abnormal results are displayed) Labs Reviewed  COMPREHENSIVE METABOLIC PANEL - Abnormal; Notable for the following components:      Result Value   Glucose, Bld 325 (*)    BUN 91 (*)     Creatinine, Ser 2.28 (*)    Albumin 3.4 (*)    GFR, Estimated 27 (*)    All other components within normal limits  CBC WITH DIFFERENTIAL/PLATELET - Abnormal; Notable for the following components:   RBC 3.10 (*)    Hemoglobin 9.2 (*)    HCT 28.7 (*)    All other components within normal limits  URINALYSIS, ROUTINE W REFLEX MICROSCOPIC    EKG None  Radiology No results found.  Procedures Procedures    Medications Ordered in ED Medications  sodium chloride 0.9 % bolus 1,000 mL (has no administration in time  range)    ED Course/ Medical Decision Making/ A&P                           Medical Decision Making Amount and/or Complexity of Data Reviewed Labs: ordered.   31:80 PM 88 year old male with past medical history of chronic anemia, chronic kidney disease, coronary artery disease, hypertension, hypothyroidism, diabetes presenting for complaints of hypotension.  Patient is alert and oriented x3, no acute distress, afebrile, stable vital signs.  Physical exam demonstrates dry mucous membranes with skin tenting.  Concern for dehydration.  IV fluid bolus of normal saline given.        Final Clinical Impression(s) / ED Diagnoses Final diagnoses:  None    Rx / DC Orders ED Discharge Orders     None         [name] Cure, DO 93/73/42 0030

## 2022-01-16 NOTE — ED Provider Triage Note (Signed)
Emergency Medicine Provider Triage Evaluation Note  John Walton , a 88 y.o. male  was evaluated in triage.  Pt complains of low blood pressure.  Patient was evaluated for a routine checkup this morning at the [place] and it was noted that his blood pressure was in the 80/40 range.  He was sent to the emergency department with a recommendation of getting IV fluids.  Patient's family states that he does not drink enough water and is chronically dehydrated.  He has history of low blood pressure.  Patient had a stroke in September of last year with mild residual left-sided weakness.  No new symptoms today including chest pain, shortness of breath, weakness, abdominal pain  History from patient's wife at patient's request due to patient's hearing difficulties  Review of Systems  Positive: As above Negative: As above  Physical Exam  BP (!) 86/58   Pulse 84   Temp 98.1 F (36.7 C) (Oral)   Resp 18   Ht '5\' 6"'$  (1.676 m)   Wt 68 kg   SpO2 100%   BMI 24.21 kg/m  Gen:   Awake, no distress   Resp:  Normal effort  MSK:   Moves extremities without difficulty  Other:    Medical Decision Making  Medically screening exam initiated at 2:35 PM.  Appropriate orders placed.  John Walton was informed that the remainder of the evaluation will be completed by another provider, this initial triage assessment does not replace that evaluation, and the importance of remaining in the ED until their evaluation is complete.     Dorothyann Peng, PA-C 01/16/22 1436

## 2022-01-17 LAB — T4, FREE: Free T4: 0.58 ng/dL — ABNORMAL LOW (ref 0.61–1.12)

## 2022-03-01 ENCOUNTER — Other Ambulatory Visit (HOSPITAL_COMMUNITY): Payer: Self-pay | Admitting: Internal Medicine

## 2022-03-01 DIAGNOSIS — N189 Chronic kidney disease, unspecified: Secondary | ICD-10-CM

## 2022-03-06 ENCOUNTER — Ambulatory Visit (HOSPITAL_BASED_OUTPATIENT_CLINIC_OR_DEPARTMENT_OTHER)
Admission: RE | Admit: 2022-03-06 | Discharge: 2022-03-06 | Disposition: A | Discharge status: Home / Self Care | Payer: No Typology Code available for payment source | Source: Ambulatory Visit | Attending: Internal Medicine | Admitting: Internal Medicine

## 2022-03-06 DIAGNOSIS — N189 Chronic kidney disease, unspecified: Secondary | ICD-10-CM | POA: Diagnosis present

## 2022-04-18 IMAGING — MR MR HEAD W/O CM
5 of 10 series · 24 of 48 positions shown · non-contrast
Comparison: CT from earlier the same day.

CLINICAL DATA: Initial evaluation for neuro deficit, stroke
suspected.

EXAM:
MRI HEAD WITHOUT CONTRAST
TECHNIQUE: Multiplanar, multiecho pulse sequences of the brain and surrounding
structures were obtained without intravenous contrast.

[Series 2: DWI · axial · 3.0mm · 0.94mm/px · z∈[-40,+116]mm · 9 of 106 slices shown (1 of 2)]
[im 1/106]
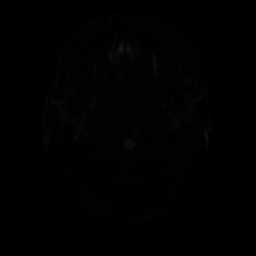
[im 14/106]
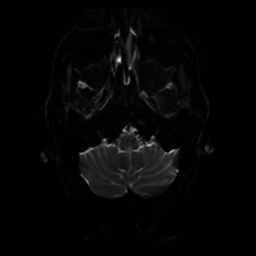
[im 27/106]
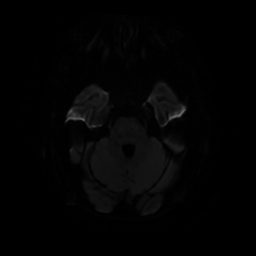
[im 40/106]
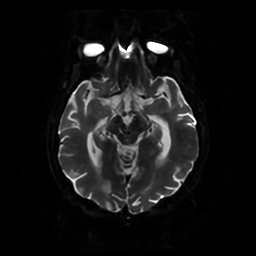
[im 53/106]
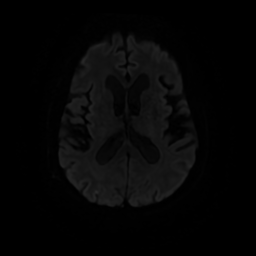
[im 66/106]
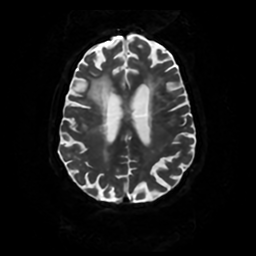
[im 79/106]
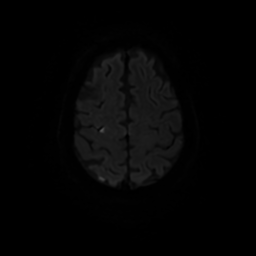
[im 92/106]
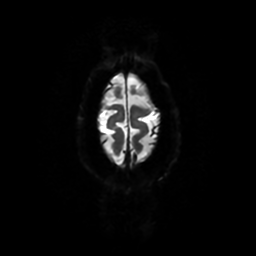
[im 106/106]
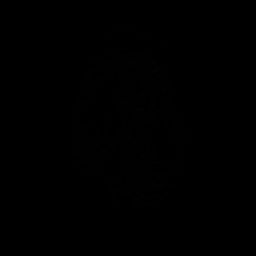

[Series 3: DWI · coronal · 4.0mm · 0.94mm/px · 6 of 73 slices shown (2 of 2)]
[im 1/73]
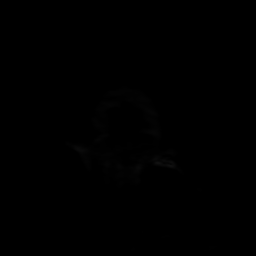
[im 15/73]
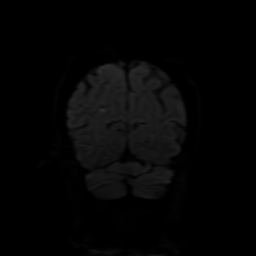
[im 29/73]
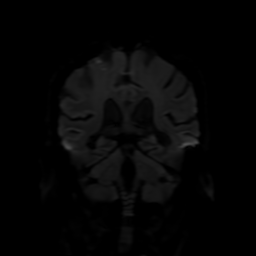
[im 44/73]
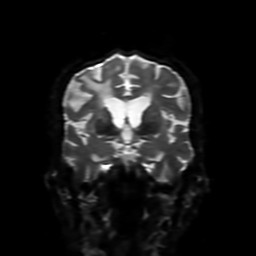
[im 58/73]
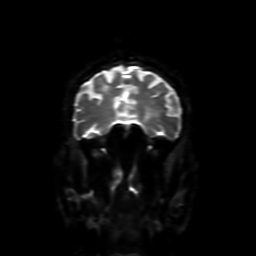
[im 73/73]
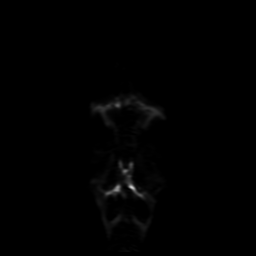

[Series 4: FLAIR · sagittal · 5.0mm · 0.23mm/px · 2 of 27 slices shown (1 of 2)]
[im 1/27]
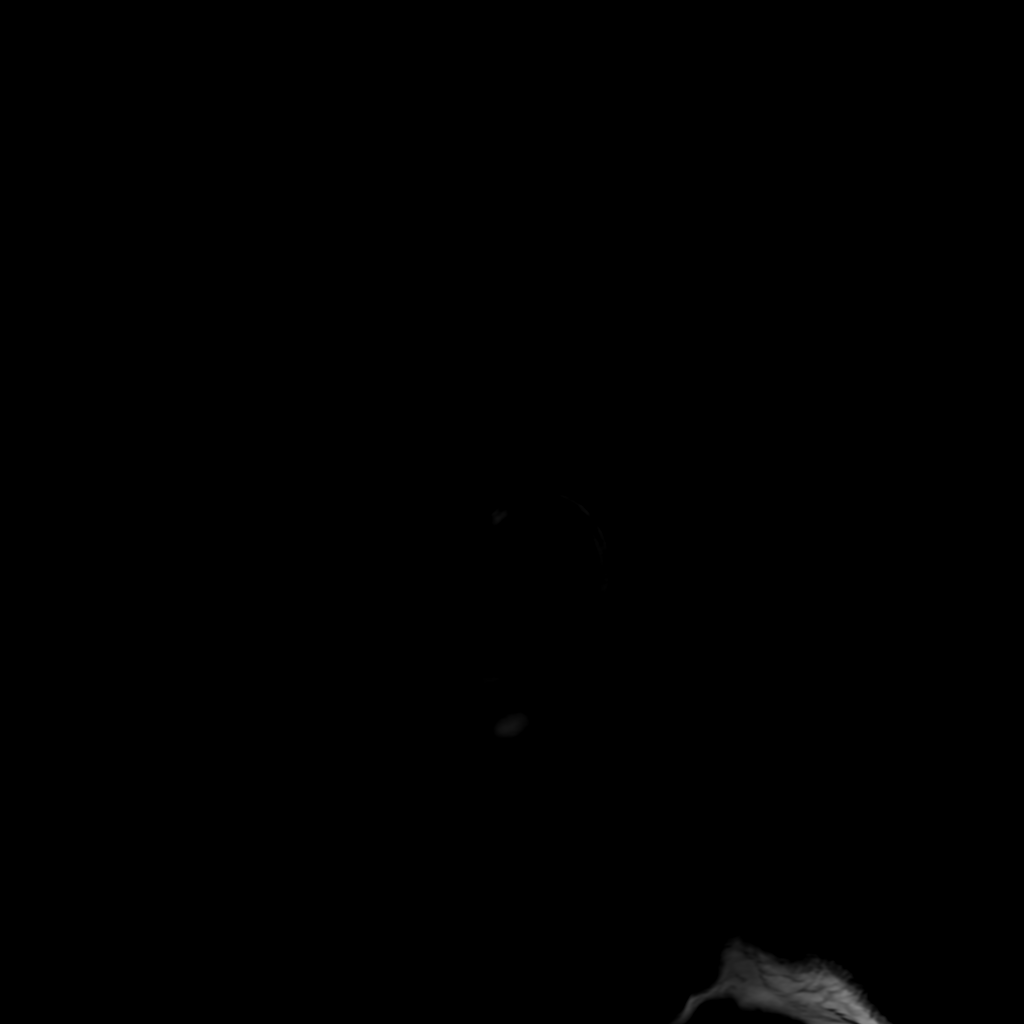
[im 27/27]
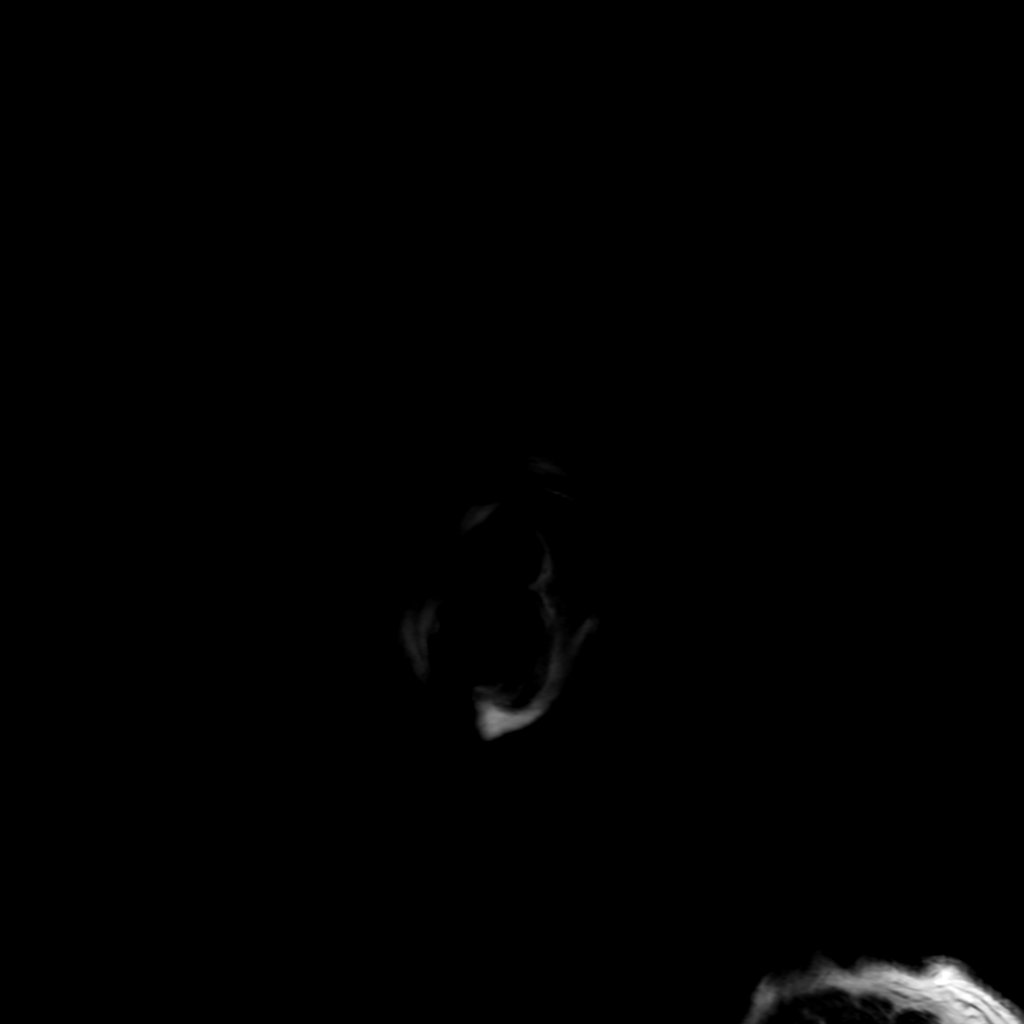

[Series 6: FLAIR · axial · 4.0mm · 0.45mm/px · z∈[-37,+113]mm · 3 of 35 slices shown (2 of 2)]
[im 1/35]
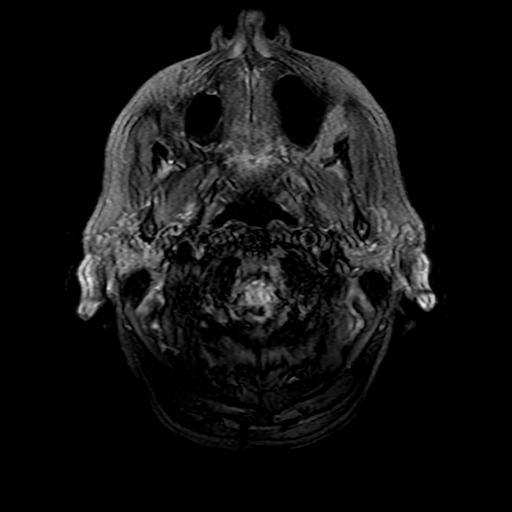
[im 18/35]
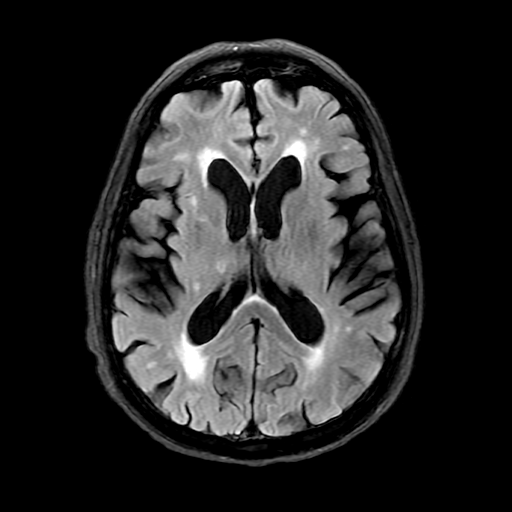
[im 35/35]
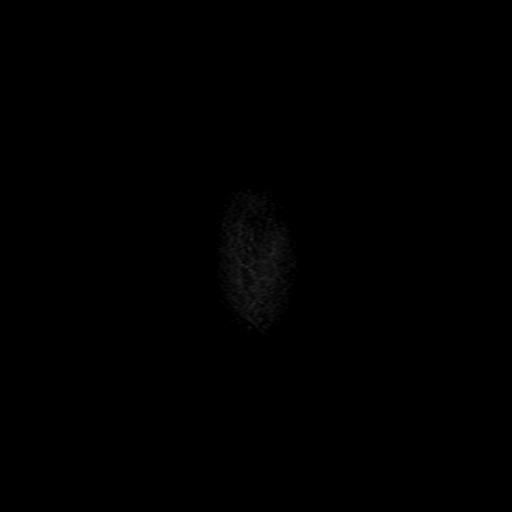

[Series 250: ADC · axial · 3.0mm · 0.94mm/px · z∈[-40,+116]mm · 4 of 53 slices shown]
[im 1/53]
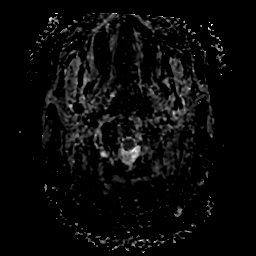
[im 18/53]
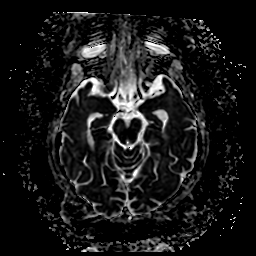
[im 35/53]
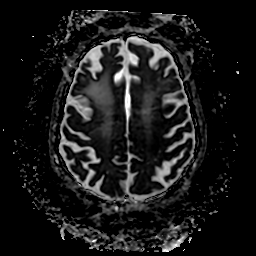
[im 53/53]
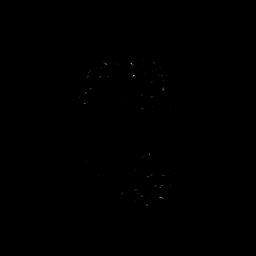

[24 of 48 positions shown; findings below may reference images not displayed]

FINDINGS: Brain: Generalized age-related cerebral atrophy. Patchy and
confluent T2/FLAIR hyperintensity involving the periventricular deep
white matter both cerebral hemispheres as well as the pons, most
consistent with chronic small vessel ischemic disease moderate in
nature. Encephalomalacia and gliosis involving the anterior right
frontal region consistent with a chronic right MCA distribution
infarct.

Patchy multifocal areas of restricted diffusion seen involving the
cortical gray matter of the right frontal, parietal, and occipital
lobes, consistent with acute ischemic infarcts. These measure up to
approximately 1 cm in size, and are somewhat watershed in
distribution. No associated hemorrhage or mass effect. No other
evidence for acute or subacute ischemia. Gray-white matter
differentiation otherwise maintained. Few scattered additional
chronic micro hemorrhages noted involving the right greater than
left cerebral hemispheres, likely small vessel/hypertensive in
nature.

No mass lesion, midline shift or mass effect. No hydrocephalus or
extra-axial fluid collection. Pituitary gland suprasellar region
within normal limits. Midline structures intact.

Vascular: Major intracranial vascular flow voids are grossly
maintained.

Skull and upper cervical spine: Craniocervical junction within
normal limits. Bone marrow signal intensity normal. No scalp soft
tissue abnormality.

Sinuses/Orbits: Prior bilateral ocular lens replacement. Mild
scattered mucosal thickening noted within the ethmoidal air cells.
Paranasal sinuses are otherwise clear. No significant mastoid
effusion. Inner ear structures grossly normal.

Other: None.
IMPRESSION: 1. Patchy multifocal acute ischemic infarcts involving the cortical
gray matter of the right frontal, parietal, and occipital lobes as
above. No associated hemorrhage or mass effect.
2. Chronic right MCA distribution infarct involving the right
frontal lobe.
3. Underlying moderate chronic microvascular ischemic disease.

## 2022-07-29 ENCOUNTER — Encounter (HOSPITAL_COMMUNITY): Payer: Self-pay | Admitting: Emergency Medicine

## 2022-07-29 ENCOUNTER — Emergency Department (HOSPITAL_COMMUNITY)
Admission: EM | Admit: 2022-07-29 | Discharge: 2022-07-29 | Disposition: A | Discharge status: Home / Self Care | Payer: No Typology Code available for payment source | Attending: Emergency Medicine | Admitting: Emergency Medicine

## 2022-07-29 ENCOUNTER — Other Ambulatory Visit: Payer: Self-pay

## 2022-07-29 ENCOUNTER — Emergency Department (HOSPITAL_COMMUNITY): Payer: No Typology Code available for payment source

## 2022-07-29 DIAGNOSIS — N3000 Acute cystitis without hematuria: Secondary | ICD-10-CM | POA: Insufficient documentation

## 2022-07-29 DIAGNOSIS — E1122 Type 2 diabetes mellitus with diabetic chronic kidney disease: Secondary | ICD-10-CM | POA: Insufficient documentation

## 2022-07-29 DIAGNOSIS — E039 Hypothyroidism, unspecified: Secondary | ICD-10-CM | POA: Diagnosis not present

## 2022-07-29 DIAGNOSIS — Z79899 Other long term (current) drug therapy: Secondary | ICD-10-CM | POA: Insufficient documentation

## 2022-07-29 DIAGNOSIS — I2510 Atherosclerotic heart disease of native coronary artery without angina pectoris: Secondary | ICD-10-CM | POA: Insufficient documentation

## 2022-07-29 DIAGNOSIS — I959 Hypotension, unspecified: Secondary | ICD-10-CM | POA: Diagnosis not present

## 2022-07-29 DIAGNOSIS — H3412 Central retinal artery occlusion, left eye: Secondary | ICD-10-CM | POA: Insufficient documentation

## 2022-07-29 DIAGNOSIS — R61 Generalized hyperhidrosis: Secondary | ICD-10-CM | POA: Diagnosis present

## 2022-07-29 DIAGNOSIS — I129 Hypertensive chronic kidney disease with stage 1 through stage 4 chronic kidney disease, or unspecified chronic kidney disease: Secondary | ICD-10-CM | POA: Insufficient documentation

## 2022-07-29 DIAGNOSIS — E860 Dehydration: Secondary | ICD-10-CM | POA: Insufficient documentation

## 2022-07-29 DIAGNOSIS — I350 Nonrheumatic aortic (valve) stenosis: Secondary | ICD-10-CM | POA: Insufficient documentation

## 2022-07-29 DIAGNOSIS — R531 Weakness: Secondary | ICD-10-CM

## 2022-07-29 DIAGNOSIS — N184 Chronic kidney disease, stage 4 (severe): Secondary | ICD-10-CM | POA: Insufficient documentation

## 2022-07-29 DIAGNOSIS — K219 Gastro-esophageal reflux disease without esophagitis: Secondary | ICD-10-CM | POA: Insufficient documentation

## 2022-07-29 LAB — URINALYSIS, ROUTINE W REFLEX MICROSCOPIC
Bilirubin Urine: NEGATIVE
Glucose, UA: NEGATIVE mg/dL
Hgb urine dipstick: NEGATIVE
Ketones, ur: NEGATIVE mg/dL
Nitrite: NEGATIVE
Protein, ur: 30 mg/dL — AB
Specific Gravity, Urine: 1.011 (ref 1.005–1.030)
pH: 5 (ref 5.0–8.0)

## 2022-07-29 LAB — COMPREHENSIVE METABOLIC PANEL
ALT: 17 U/L (ref 0–44)
AST: 18 U/L (ref 15–41)
Albumin: 3.2 g/dL — ABNORMAL LOW (ref 3.5–5.0)
Alkaline Phosphatase: 75 U/L (ref 38–126)
Anion gap: 7 (ref 5–15)
BUN: 46 mg/dL — ABNORMAL HIGH (ref 8–23)
CO2: 21 mmol/L — ABNORMAL LOW (ref 22–32)
Calcium: 9 mg/dL (ref 8.9–10.3)
Chloride: 110 mmol/L (ref 98–111)
Creatinine, Ser: 2.07 mg/dL — ABNORMAL HIGH (ref 0.61–1.24)
GFR, Estimated: 30 mL/min — ABNORMAL LOW (ref >60)
Glucose, Bld: 160 mg/dL — ABNORMAL HIGH (ref 70–99)
Potassium: 4.4 mmol/L (ref 3.5–5.1)
Sodium: 138 mmol/L (ref 135–145)
Total Bilirubin: 0.3 mg/dL (ref 0.3–1.2)
Total Protein: 6.5 g/dL (ref 6.5–8.1)

## 2022-07-29 LAB — CBC
HCT: 32.4 % — ABNORMAL LOW (ref 39.0–52.0)
Hemoglobin: 9.8 g/dL — ABNORMAL LOW (ref 13.0–17.0)
MCH: 27.5 pg (ref 26.0–34.0)
MCHC: 30.2 g/dL (ref 30.0–36.0)
MCV: 91 fL (ref 80.0–100.0)
Platelets: 234 10*3/uL (ref 150–400)
RBC: 3.56 MIL/uL — ABNORMAL LOW (ref 4.22–5.81)
RDW: 13.2 % (ref 11.5–15.5)
WBC: 7.5 10*3/uL (ref 4.0–10.5)
nRBC: 0 % (ref 0.0–0.2)

## 2022-07-29 LAB — CBG MONITORING, ED
Glucose-Capillary: 136 mg/dL — ABNORMAL HIGH (ref 70–99)
Glucose-Capillary: 154 mg/dL — ABNORMAL HIGH (ref 70–99)

## 2022-07-29 LAB — MAGNESIUM: Magnesium: 2.1 mg/dL (ref 1.7–2.4)

## 2022-07-29 MED ORDER — CEFPODOXIME PROXETIL 100 MG PO TABS: 100.0000 mg | ORAL_TABLET | Freq: Two times a day (BID) | ORAL | 0 refills | Status: AC

## 2022-07-29 NOTE — ED Provider Notes (Signed)
[place] EMERGENCY DEPARTMENT AT [facility] Provider Note   CSN: [id number] Arrival date & time: 07/29/22  1914     History  Chief Complaint  Patient presents with   Weakness    John Walton is a 89 y.o. male with HTN, HLD, CKD stage IV, T2DM, right bundle branch block, BPH status post TURP, history of CRAO, GERD, aortic stenosis who presents with diaphoresis.   Patient presents with wife who provides additional history.  She states that he called out this morning at approximately 8:05 AM stating that he felt "horrible."  Wife called EMS and they found him pale and diaphoretic, wife said he looked "ashen."  He did not fall, hit his head, or lose consciousness.  His initial blood pressure was 86/42 but then when they moved back to the ambulance before any intervention his blood pressure was in the 130s systolic.  Patient denies any complaints and does not remember the event this morning.  Wife states that he is currently at his mental status baseline.  He has not had any other complaints except overnight he had some pain in his heel for which they are following with podiatry, wife gave him a Norco after which she had improved.  Patient states that he feels totally fine, denies any chest pain, lightheadedness, palpitations, shortness of breath, abdominal pain, nausea any diarrhea constipation. Patient hasn't noticed any urinary symptoms.   He is fed with the G-tube and wife states that there has been some redness around it but patient denies any pain, swelling, purulent drainage.  They do have home health that helps them with this.  They actually have a follow-up endoscopy this week on Wednesday to have the G-tube exchanged as per usual. Wife at son at bedside state that patient typically doesn't share with them when he is having pain/discomfort.  HPI     Home Medications Prior to Admission medications   Medication Sig Start Date End Date Taking? Authorizing Provider   cefpodoxime (VANTIN) 100 MG tablet Take 1 tablet (100 mg total) by mouth 2 (two) times daily for 5 days. 07/29/22 08/03/22 Yes Loetta Rough, MD  acetaminophen (TYLENOL) 325 MG tablet Take by mouth as needed for pain.    [provider]  aspirin EC 81 MG tablet Take 1 tablet (81 mg total) by mouth daily. 12/26/20   Leroy Sea, MD  atorvastatin (LIPITOR) 40 MG tablet Take 40 mg by mouth daily.    [provider]  Calcium-Vitamin D 500-3.125 MG-MCG TABS Take 1 tablet by mouth in the morning, at noon, and at bedtime.    [provider]  carboxymethylcellulose (REFRESH PLUS) 0.5 % SOLN Place 1 drop into both eyes 6 (six) times daily.    [provider]  collagenase (SANTYL) ointment Apply 1 application topically every other day. APPLIED TO HEEL WOUND    [provider]  ferrous sulfate 325 (65 FE) MG tablet Take 1 tablet (325 mg total) by mouth daily with breakfast. 12/26/20   Leroy Sea, MD  HYDROcodone-Acetaminophen (NORCO PO) Take 1 tablet by mouth every 6 (six) hours as needed (pain).    [provider]  latanoprost (XALATAN) 0.005 % ophthalmic solution Place 1 drop into both eyes at bedtime.  03/13/19   [provider]  Lifitegrast Benay Spice) 5 % SOLN Place 1 drop into both eyes in the morning and at bedtime.    [provider]  methimazole (TAPAZOLE) 10 MG tablet Take 10 mg  by mouth daily.    [provider]  nutrition supplement, JUVEN, (JUVEN) PACK Take 1 packet by mouth 2 (two) times daily between meals.    [provider]  Nutritional Supplements (PROSOURCE) LIQD Give 30 mLs by tube in the morning and at bedtime.    [provider]  pantoprazole (PROTONIX) 40 MG tablet as needed for heartburn.    [provider]  SIMBRINZA 1-0.2 % SUSP Place 1 drop into both eyes in the morning and at bedtime.  05/16/19   [provider]  vitamin B-12 (CYANOCOBALAMIN) 500 MCG tablet  Take 1 tablet by mouth 2 (two) times daily. 01/03/21   [provider]  Water For Irrigation, Sterile (FREE WATER) SOLN Place 120 mLs into feeding tube 5 (five) times daily. 04/03/20   Glade Lloyd, MD  White Petrolatum-Mineral Oil (TEARS AGAIN) OINT Place 1 application into both eyes in the morning and at bedtime.    [provider]      Allergies    Other, Influenza vaccines, and Robitussin cold cough+ chest [dextromethorphan-guaifenesin]    Review of Systems   Review of Systems Review of systems Negative for f/c.  A 10 point review of systems was performed and is negative unless otherwise reported in HPI.  Physical Exam Updated Vital Signs BP (!) 155/64   Pulse 72   Temp (!) 94.3 F (34.6 C) (Axillary) Comment: unable to obtain oral temp  Resp 17   Ht 5\' 6"  (1.676 m)   Wt 68 kg   SpO2 97%   BMI 24.21 kg/m  Physical Exam General: Normal appearing elderly male, lying in bed. Very hard of hearing. HEENT: PERRLA, EOMI, Sclera anicteric, MMM, trachea midline. Clear oropharynx. Tongue protrudes midline. Cardiology: RRR, no murmurs/rubs/gallops. BL radial and DP pulses equal bilaterally.  Resp: Normal respiratory rate and effort. CTAB, no wheezes, rhonchi, crackles.  Abd: G tube present in LUQ with surrounding erythema/granulation tissue and mild bleeding on gauze. No induration, fluctuance, purulent drainage. Nontender. Soft, non-tender, non-distended. No rebound tenderness or guarding.  GU: Deferred. MSK: No peripheral edema or signs of trauma. Extremities without deformity or TTP. No cyanosis or clubbing. Skin: warm, dry.  Neuro: A&Ox4, CNs II-XII grossly intact. MAEs. Sensation grossly intact.  Psych: Normal mood and affect.   ED Results / Procedures / Treatments   Labs (all labs ordered are listed, but only abnormal results are displayed) Labs Reviewed  CBC - Abnormal; Notable for the following components:      Result Value   RBC 3.56 (*)    Hemoglobin 9.8  (*)    HCT 32.4 (*)    All other components within normal limits  URINALYSIS, ROUTINE W REFLEX MICROSCOPIC - Abnormal; Notable for the following components:   APPearance HAZY (*)    Protein, ur 30 (*)    Leukocytes,Ua SMALL (*)    Bacteria, UA FEW (*)    All other components within normal limits  COMPREHENSIVE METABOLIC PANEL - Abnormal; Notable for the following components:   CO2 21 (*)    Glucose, Bld 160 (*)    BUN 46 (*)    Creatinine, Ser 2.07 (*)    Albumin 3.2 (*)    GFR, Estimated 30 (*)    All other components within normal limits  CBG MONITORING, ED - Abnormal; Notable for the following components:   Glucose-Capillary 136 (*)    All other components within normal limits  CBG MONITORING, ED - Abnormal; Notable for the following components:  Glucose-Capillary 154 (*)    All other components within normal limits  MAGNESIUM    EKG EKG Interpretation  Date/Time:  Monday July 29 2022 09:43:52 EDT Ventricular Rate:  67 PR Interval:  196 QRS Duration: 148 QT Interval:  468 QTC Calculation: 494 R Axis:   -71 Text Interpretation: Normal sinus rhythm Left axis deviation Right bundle branch block Similar to prior Confirmed by Vivi Barrack [phone]) on 07/29/2022 10:06:46 AM  Radiology DG Chest 2 View  Result Date: 07/29/2022 CLINICAL DATA:  Weakness EXAM: CHEST - 2 VIEW COMPARISON:  CXR 02/23/21 FINDINGS: No pleural effusion. No pneumothorax. Bibasilar atelectasis. Normal cardiac and mediastinal contours. No radiographically apparent displaced rib fractures. Surgical clips in the right upper quadrant. Partially visualized gastrostomy tube in place. Vertebral body heights are maintained. Degenerative changes of the bilateral AC joints. IMPRESSION: Bibasilar atelectasis. Electronically Signed   By: Lorenza Cambridge M.D.   On: 07/29/2022 10:11    Procedures Procedures    Medications Ordered in ED Medications - No data to display  ED Course/ Medical Decision Making/ A&P                           Medical Decision Making Amount and/or Complexity of Data Reviewed Labs: ordered. Decision-making details documented in ED Course. Radiology: ordered. Decision-making details documented in ED Course.  Risk Prescription drug management.    This patient presents to the ED for concern of mild hypotension, feeling "horrible," since resolved; this involves an extensive number of treatment options, and is a complaint that carries with it a high risk of complications and morbidity.  I considered the following differential and admission for this acute, potentially life threatening condition.   MDM:    Patient was noted to have hypotension when EMS arrived that resolved without intervention.  Patient's wife and son states that he does not drink any water and that he is typically very dehydrated.  This could have contributed to his hypotension.  Patient has been hypertensive here in the emergency department and completely asymptomatic, does not feel lightheaded or have any chest pain.  His EKG does not demonstrate any ischemic signs and I have very low suspicion for ACS or arrhythmia without any symptoms.  He has no GI losses or electrolyte abnormalities, no hypoglycemia, and his hemoglobin is at baseline without any report of hematochezia/melena.  He has had no fevers or chills and complains of no localizing infectious symptoms however his UA demonstrates few bacteria, small leukocytes, WBC 11-20.  I discussed with the patient's wife and his son who states that patient does not typically tell them when he has any symptoms though patient denies any urinary symptoms currently.  I performed shared decision-making with the patient's wife and son about possible antibiotic treatment and given that the patient was hypotensive earlier with some signs of UTI on UA, typically not fully disclosing discomfort or symptoms that he may have, I will treat him with cefpodoxime 100 mg twice daily x 5 days.   Patient's workup is otherwise very reassuring and he is remained hemodynamically stable while here in the emergency department and asymptomatic.  Very low concern for acute life-threatening cause of shock such as cardiogenic, distributive, obstructive shock.  He will be discharged in the care of his wife and son.  Instructed to follow-up for his G-tube exchange on Wednesday as originally scheduled and follow-up with his PCP within 1 week.  All questions answered to patient satisfaction.  Discharged with discharge instructions and return precautions.  Instructed to stay well-hydrated at home.   Clinical Course as of 07/29/22 1344  Mon Jul 29, 2022  1005 Glucose-Capillary(!): 136 [HN]  1042 Hemoglobin(!): 9.8 At baseline [HN]  1043 WBC: 7.5 No leukocytosis [HN]  1043 DG Chest 2 View FINDINGS: No pleural effusion. No pneumothorax. Bibasilar atelectasis. Normal cardiac and mediastinal contours. No radiographically apparent displaced rib fractures. Surgical clips in the right upper quadrant. Partially visualized gastrostomy tube in place. Vertebral body heights are maintained. Degenerative changes of the bilateral AC joints.  IMPRESSION: Bibasilar atelectasis.   [HN]  1147 CMP close to patient's baseline [HN]  1147 Magnesium: 2.1 [HN]    Clinical Course User Index [HN] Loetta Rough, MD    Labs: I Ordered, and personally interpreted labs.  The pertinent results include: Those listed above  Imaging Studies ordered: I ordered imaging studies including CXR I independently visualized and interpreted imaging. I agree with the radiologist interpretation  Additional history obtained from wife, son, chart review.    Cardiac Monitoring: The patient was maintained on a cardiac monitor.  I personally viewed and interpreted the cardiac monitored which showed an underlying rhythm of: NSR  Reevaluation: After the interventions noted above, I reevaluated the patient and found that they have  :resolved  Social Determinants of Health: Lives w/ wife  Disposition:  DC  Co morbidities that complicate the patient evaluation  Past Medical History:  Diagnosis Date   Anemia    Carotid artery occlusion    Chicken pox    Chronic kidney disease    Coronary artery disease    Diabetes mellitus without complication (HCC)    Frequent headaches    Glaucoma    Hay fever    History of blood transfusion    HTN (hypertension)    Hyperlipidemia    Hyperlipidemia    Hypothyroidism    Migraines    Peripheral vascular disease (HCC)    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(HCC)      Medicines Meds ordered this encounter  Medications   cefpodoxime (VANTIN) 100 MG tablet    Sig: Take 1 tablet (100 mg total) by mouth 2 (two) times daily for 5 days.    Dispense:  10 tablet    Refill:  0    I have reviewed the patients home medicines and have made adjustments as needed  Problem List / ED Course: Problem List Items Addressed This Visit   None Visit Diagnoses     Hypotension, unspecified hypotension type    -  Primary   Generalized weakness       Acute cystitis without hematuria       Dehydration                       This note was created using dictation software, which may contain spelling or grammatical errors.    Loetta Rough, MD 07/29/22 [phone]

## 2022-07-29 NOTE — Discharge Instructions (Addendum)
Thank you for coming to [facility] Emergency Department. You were seen for episode of weakness and low blood pressure. We did an exam, labs, and imaging, and these showed likely a urinary tract infection. Given that his blood pressure was low earlier we will treat his UTI with cefpodoxime twice per day for 5 days. It is likely that his dehydration contributed to his low blood pressure, please make sure that he drinks water. Please go to his previously-scheduled G tube exchange as originally scheduled.  Please follow up with your primary care provider within 1 week.   Do not hesitate to return to the ED or call 911 if you experience: -Worsening symptoms -Abdominal pain, nausea/vomiting so severe he cannot eat/drink anything -Lightheadedness, passing out -Fevers/chills -Anything else that concerns you

## 2022-07-29 NOTE — ED Triage Notes (Signed)
Per [facility] pt coming from home- called out by wife saying patient complained of feeling horrible at 8:05 am. On arrival patient was pale and diaphoretic. Initial BP 86/42 when moved out to ambulance and BP was improved. Patient denies any complaints.

## 2022-07-29 NOTE — ED Notes (Signed)
Pt ambulated to the bathroom x1 assist. Slow but steady on his feet. Typically uses a walker at home.

## 2022-08-23 ENCOUNTER — Other Ambulatory Visit: Payer: Self-pay | Admitting: *Deleted

## 2022-08-23 DIAGNOSIS — I6521 Occlusion and stenosis of right carotid artery: Secondary | ICD-10-CM

## 2022-09-03 NOTE — Progress Notes (Unsigned)
HISTORY AND PHYSICAL     CC:  follow up. Requesting Provider:  Gaspar Garbe, MD  HPI: This is a 89 y.o. male here for follow up for carotid artery stenosis.  Pt is s/p right CEA with primary closure for symptomatic carotid artery stenosis on 12/22/2020 by Dr. Edilia Bo.    Pt was last seen 09/13/2021 and at that time he was doing well without neurological sx.  He was having some leg swelling and Dr. Edilia Bo recommended leg elevation.  Pt also had hx of a right heel ulcer after having hip surgery in 2021 and he was able to heal this.  His toe pressure was 125 mmHg on the right and Dr. Edilia Bo did not think this warranted further workup.   Pt returns today for follow up.    Pt *** any amaurosis fugax, speech difficulties, weakness, numbness, paralysis or clumsiness or facial droop.    ***  The pt is on a statin for cholesterol management.  The pt is on a daily aspirin.   Other AC:  none The pt is not on medication for hypertension.   The pt is not on medication for diabetes Tobacco hx:  former  Pt does *** have family hx of AAA.  Past Medical History:  Diagnosis Date   Anemia    Carotid artery occlusion    Chicken pox    Chronic kidney disease    Coronary artery disease    Diabetes mellitus without complication (HCC)    Frequent headaches    Glaucoma    Hay fever    History of blood transfusion    HTN (hypertension)    Hyperlipidemia    Hyperlipidemia    Hypothyroidism    Migraines    Peripheral vascular disease (HCC)    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[facility])     Past Surgical History:  Procedure Laterality Date   CHOLECYSTECTOMY, LAPAROSCOPIC     ENDARTERECTOMY Right 12/22/2020   Procedure: RIGHT CAROTID ENDARTERECTOMY;  Surgeon: Chuck Hint, MD;  Location: [facility] OR;  Service: Vascular;  Laterality: Right;   IR GASTROSTOMY TUBE MOD SED  03/29/2020   TONSILLECTOMY  [birth year]/01/1940   TOTAL HIP ARTHROPLASTY Left 03/08/2020   Procedure: TOTAL HIP  ARTHROPLASTY;  Surgeon: Eldred Manges, MD;  Location: [facility] ORS;  Service: Orthopedics;  Laterality: Left;   TRANSURETHRAL RESECTION OF PROSTATE N/A 12/14/2020   Procedure: TRANSURETHRAL RESECTION OF THE PROSTATE (TURP);  Surgeon: Marcine Matar, MD;  Location: [facility] ORS;  Service: Urology;  Laterality: N/A;  2 HRS    Allergies  Allergen Reactions   Other Swelling    ALL COUGH MEDICATIONS   Influenza Vaccines     Other reaction(s): Unknown   Robitussin Cold Cough+ Chest [Dextromethorphan-Guaifenesin] Swelling    Laryngeal edema - any cough syrup    Current Outpatient Medications  Medication Sig Dispense Refill   acetaminophen (TYLENOL) 325 MG tablet Take by mouth as needed for pain.     aspirin EC 81 MG tablet Take 1 tablet (81 mg total) by mouth daily. 30 tablet 0   atorvastatin (LIPITOR) 40 MG tablet Take 40 mg by mouth daily.     Calcium-Vitamin D 500-3.125 MG-MCG TABS Take 1 tablet by mouth in the morning, at noon, and at bedtime.     carboxymethylcellulose (REFRESH PLUS) 0.5 % SOLN Place 1 drop into both eyes 6 (six) times daily.     collagenase (SANTYL) ointment Apply 1 application topically every other day. APPLIED TO HEEL WOUND  ferrous sulfate 325 (65 FE) MG tablet Take 1 tablet (325 mg total) by mouth daily with breakfast. 30 tablet 0   HYDROcodone-Acetaminophen (NORCO PO) Take 1 tablet by mouth every 6 (six) hours as needed (pain).     latanoprost (XALATAN) 0.005 % ophthalmic solution Place 1 drop into both eyes at bedtime.      Lifitegrast (XIIDRA) 5 % SOLN Place 1 drop into both eyes in the morning and at bedtime.     methimazole (TAPAZOLE) 10 MG tablet Take 10 mg by mouth daily.     nutrition supplement, JUVEN, (JUVEN) PACK Take 1 packet by mouth 2 (two) times daily between meals.     Nutritional Supplements (PROSOURCE) LIQD Give 30 mLs by tube in the morning and at bedtime.     pantoprazole (PROTONIX) 40 MG tablet as needed for heartburn.     SIMBRINZA 1-0.2 % SUSP Place  1 drop into both eyes in the morning and at bedtime.      vitamin B-12 (CYANOCOBALAMIN) 500 MCG tablet Take 1 tablet by mouth 2 (two) times daily.     Water For Irrigation, Sterile (FREE WATER) SOLN Place 120 mLs into feeding tube 5 (five) times daily. 1000 mL 15   White Petrolatum-Mineral Oil (TEARS AGAIN) OINT Place 1 application into both eyes in the morning and at bedtime.     No current facility-administered medications for this visit.    Family History  Problem Relation Age of Onset   Liver disease Mother    Skin cancer Father    Skin cancer Paternal Grandfather    Diabetes Maternal Grandfather     Social History   Socioeconomic History   Marital status: Married    Spouse name: Not on file   Number of children: Not on file   Years of education: Not on file   Highest education level: Not on file  Occupational History   Not on file  Tobacco Use   Smoking status: Former    Types: Cigarettes    Start date: 04/01/1968    Quit date: 01/11/1977    Years since quitting: 45.6   Smokeless tobacco: Never  Vaping Use   Vaping Use: Never used  Substance and Sexual Activity   Alcohol use: Not Currently   Drug use: No   Sexual activity: Never  Other Topics Concern   Not on file  Social History Narrative   Not on file   Social Determinants of Health   Financial Resource Strain: Not on file  Food Insecurity: Not on file  Transportation Needs: Not on file  Physical Activity: Not on file  Stress: Not on file  Social Connections: Not on file  Intimate Partner Violence: Not on file     REVIEW OF SYSTEMS:  *** [X]  denotes positive finding, [ ]  denotes negative finding Cardiac  Comments:  Chest pain or chest pressure:    Shortness of breath upon exertion:    Short of breath when lying flat:    Irregular heart rhythm:        Vascular    Pain in calf, thigh, or hip brought on by ambulation:    Pain in feet at night that wakes you up from your sleep:     Blood clot in  your veins:    Leg swelling:         Pulmonary    Oxygen at home:    Productive cough:     Wheezing:  Neurologic    Sudden weakness in arms or legs:     Sudden numbness in arms or legs:     Sudden onset of difficulty speaking or slurred speech:    Temporary loss of vision in one eye:     Problems with dizziness:         Gastrointestinal    Blood in stool:     Vomited blood:         Genitourinary    Burning when urinating:     Blood in urine:        Psychiatric    Major depression:         Hematologic    Bleeding problems:    Problems with blood clotting too easily:        Skin    Rashes or ulcers:        Constitutional    Fever or chills:      PHYSICAL EXAMINATION:  ***  General:  WDWN in NAD; vital signs documented above Gait: Not observed HENT: WNL, normocephalic Pulmonary: normal non-labored breathing Cardiac: {Desc; regular/irreg:14544} HR, {With/Without:20273} carotid bruit*** Abdomen: soft, NT; aortic pulse is *** palpable Skin: {With/Without:20273} rashes Vascular Exam/Pulses:  Right Left  Radial {Exam; arterial pulse strength 0-4:30167} {Exam; arterial pulse strength 0-4:30167}  Popliteal {Exam; arterial pulse strength 0-4:30167} {Exam; arterial pulse strength 0-4:30167}  DP {Exam; arterial pulse strength 0-4:30167} {Exam; arterial pulse strength 0-4:30167}  PT {Exam; arterial pulse strength 0-4:30167} {Exam; arterial pulse strength 0-4:30167}   Extremities: {With/Without:20273} open wounds Musculoskeletal: no muscle wasting or atrophy  Neurologic: A&O X 3; moving all extremities equally; speech is fluent/normal Psychiatric:  The pt has {Desc; normal/abnormal:11317::"Normal"} affect.   Non-Invasive Vascular Imaging:   Carotid Duplex on 09/05/2022 Right:  ***% ICA stenosis Left:  ***% ICA stenosis ***  Previous Carotid duplex on 09/13/2021: Right: normal Left:   1-39% ICA stenosis    ASSESSMENT/PLAN:: 89 y.o. male here for follow up  carotid artery stenosis and is s/p  right CEA with primary closure for symptomatic carotid artery stenosis on 12/22/2020 by Dr. Edilia Bo.    -duplex today reveals *** -discussed s/s of stroke with pt and he understands should he develop any of these sx, he will go to the nearest ER or call 911. -pt will f/u in ***  with carotid duplex -pt will call sooner should he have any issues. -continue statin/asa    Doreatha Massed, [facility] [phone]  Clinic MD:  Edilia Bo

## 2022-09-05 ENCOUNTER — Encounter: Payer: Self-pay | Admitting: Physician Assistant

## 2022-09-05 ENCOUNTER — Ambulatory Visit (HOSPITAL_COMMUNITY)
Admission: RE | Admit: 2022-09-05 | Discharge: 2022-09-05 | Disposition: A | Discharge status: Home / Self Care | Payer: No Typology Code available for payment source | Source: Ambulatory Visit | Attending: Vascular Surgery | Admitting: Vascular Surgery

## 2022-09-05 ENCOUNTER — Ambulatory Visit (INDEPENDENT_AMBULATORY_CARE_PROVIDER_SITE_OTHER): Payer: No Typology Code available for payment source | Admitting: Physician Assistant

## 2022-09-05 VITALS — BP 121/64 | HR 90 | Temp 97.3°F | Resp 18

## 2022-09-05 DIAGNOSIS — I6522 Occlusion and stenosis of left carotid artery: Secondary | ICD-10-CM | POA: Diagnosis not present

## 2022-09-05 DIAGNOSIS — Z9889 Other specified postprocedural states: Secondary | ICD-10-CM

## 2022-09-05 DIAGNOSIS — I6521 Occlusion and stenosis of right carotid artery: Secondary | ICD-10-CM

## 2022-09-18 ENCOUNTER — Other Ambulatory Visit: Payer: Self-pay

## 2022-09-18 DIAGNOSIS — I6522 Occlusion and stenosis of left carotid artery: Secondary | ICD-10-CM

## 2022-09-18 DIAGNOSIS — I6521 Occlusion and stenosis of right carotid artery: Secondary | ICD-10-CM

## 2022-12-20 ENCOUNTER — Ambulatory Visit (HOSPITAL_BASED_OUTPATIENT_CLINIC_OR_DEPARTMENT_OTHER): Payer: Medicare Other

## 2022-12-20 DIAGNOSIS — I10 Essential (primary) hypertension: Secondary | ICD-10-CM

## 2022-12-20 LAB — ECHOCARDIOGRAM COMPLETE
AR max vel: 1.16 cm2
AV Area VTI: 1.32 cm2
AV Area mean vel: 1.13 cm2
AV Mean grad: 9 mmHg
AV Peak grad: 17.5 mmHg
Ao pk vel: 2.09 m/s
Area-P 1/2: 4.49 cm2
S' Lateral: 2.8 cm

## 2022-12-24 ENCOUNTER — Other Ambulatory Visit (HOSPITAL_BASED_OUTPATIENT_CLINIC_OR_DEPARTMENT_OTHER): Payer: Self-pay

## 2022-12-24 DIAGNOSIS — I10 Essential (primary) hypertension: Secondary | ICD-10-CM

## 2023-02-12 ENCOUNTER — Emergency Department (HOSPITAL_COMMUNITY): Payer: No Typology Code available for payment source

## 2023-02-12 ENCOUNTER — Emergency Department (HOSPITAL_COMMUNITY)
Admission: EM | Admit: 2023-02-12 | Discharge: 2023-02-12 | Disposition: A | Discharge status: Home / Self Care | Payer: No Typology Code available for payment source | Attending: Emergency Medicine | Admitting: Emergency Medicine

## 2023-02-12 ENCOUNTER — Encounter (HOSPITAL_COMMUNITY): Payer: Self-pay | Admitting: Emergency Medicine

## 2023-02-12 DIAGNOSIS — I10 Essential (primary) hypertension: Secondary | ICD-10-CM | POA: Diagnosis not present

## 2023-02-12 DIAGNOSIS — W06XXXA Fall from bed, initial encounter: Secondary | ICD-10-CM | POA: Insufficient documentation

## 2023-02-12 DIAGNOSIS — Z7982 Long term (current) use of aspirin: Secondary | ICD-10-CM | POA: Insufficient documentation

## 2023-02-12 DIAGNOSIS — S0990XA Unspecified injury of head, initial encounter: Secondary | ICD-10-CM | POA: Insufficient documentation

## 2023-02-12 DIAGNOSIS — E119 Type 2 diabetes mellitus without complications: Secondary | ICD-10-CM | POA: Insufficient documentation

## 2023-02-12 DIAGNOSIS — W19XXXA Unspecified fall, initial encounter: Secondary | ICD-10-CM

## 2023-02-12 NOTE — ED Notes (Signed)
PT stated he was getting out of bed when he slipped and fell.  Pt was not in a c-collar as he had been up and ambulatory since the fall.  Pt had been to his doctor before coming here.  Pt takes Aspirin, but no blood thinners.  Pt is acting at baseline and is not in any pain.  Pt's wife stated that his PCP encouraged them to come here due to his age and hx of stroke. Pt denied n/v/d, dizziness, SHOB, headache, or visual changes.

## 2023-02-12 NOTE — ED Triage Notes (Signed)
Pt here from home with c/o slip and fall hitting the back of his heads, n loc no thinners

## 2023-02-12 NOTE — Discharge Instructions (Signed)
Continue to take careful effort when transferring between bed to walking.  Return to the ER if you begin experiencing any sort of confusion, blurry vision, chest pain, shortness of breath.

## 2023-02-12 NOTE — ED Provider Notes (Signed)
[place] [facility] AT [facility] Provider Note   CSN: [id number] Arrival date & time: 02/12/23  1645     History  Chief Complaint  Patient presents with   John Walton is a 89 y.o. male.  Patient presents to the ED today after a mechanical fall where he slipped when getting out of his bed 5 hours previously.  He slipped hitting his posterior first and then falling backwards to hit his head on the ground.  He states "nothing hurts".  Denies headache, change in vision, neck pain, back pain, sacral pain, weakness, numbness.   Fall Pertinent negatives include no chest pain, no abdominal pain, no headaches and no shortness of breath.       Home Medications Prior to Admission medications   Medication Sig Start Date End Date Taking? Authorizing Provider  acetaminophen (TYLENOL) 325 MG tablet Take by mouth as needed for pain.    [provider]  aspirin EC 81 MG tablet Take 1 tablet (81 mg total) by mouth daily. 12/26/20   Leroy Sea, MD  atorvastatin (LIPITOR) 40 MG tablet Take 40 mg by mouth daily.    [provider]  Calcium-Vitamin D 500-3.125 MG-MCG TABS Take 1 tablet by mouth in the morning, at noon, and at bedtime.    [provider]  carboxymethylcellulose (REFRESH PLUS) 0.5 % SOLN Place 1 drop into both eyes 6 (six) times daily.    [provider]  collagenase (SANTYL) ointment Apply 1 application topically every other day. APPLIED TO HEEL WOUND    [provider]  ferrous sulfate 325 (65 FE) MG tablet Take 1 tablet (325 mg total) by mouth daily with breakfast. 12/26/20   Leroy Sea, MD  HYDROcodone-Acetaminophen (NORCO PO) Take 1 tablet by mouth every 6 (six) hours as needed (pain).    [provider]  latanoprost (XALATAN) 0.005 % ophthalmic solution Place 1 drop into both eyes at bedtime.  03/13/19   [provider]  Lifitegrast Benay Spice) 5 % SOLN Place 1 drop into  both eyes in the morning and at bedtime.    [provider]  methimazole (TAPAZOLE) 10 MG tablet Take 10 mg by mouth daily.    [provider]  nutrition supplement, JUVEN, (JUVEN) PACK Take 1 packet by mouth 2 (two) times daily between meals.    [provider]  Nutritional Supplements (PROSOURCE) LIQD Give 30 mLs by tube in the morning and at bedtime.    [provider]  pantoprazole (PROTONIX) 40 MG tablet as needed for heartburn.    [provider]  SIMBRINZA 1-0.2 % SUSP Place 1 drop into both eyes in the morning and at bedtime.  05/16/19   [provider]  vitamin B-12 (CYANOCOBALAMIN) 500 MCG tablet Take 1 tablet by mouth 2 (two) times daily. 01/03/21   [provider]  Water For Irrigation, Sterile (FREE WATER) SOLN Place 120 mLs into feeding tube 5 (five) times daily. 04/03/20   Glade Lloyd, MD  White Petrolatum-Mineral Oil (TEARS AGAIN) OINT Place 1 application into both eyes in the morning and at bedtime.    [provider]      Allergies    Other, Influenza vaccines, and Robitussin cold cough+ chest [dextromethorphan-guaifenesin]    Review of Systems   Review of Systems  Constitutional:  Negative for fatigue.  Respiratory:  Negative for chest tightness and shortness of breath.   Cardiovascular:  Negative for chest pain.  Gastrointestinal:  Negative for abdominal pain.  Musculoskeletal:  Negative for arthralgias, back pain, gait problem, myalgias, neck pain and neck stiffness.  Neurological:  Negative for dizziness, seizures, syncope, weakness, numbness and headaches.  Psychiatric/Behavioral:  Negative for behavioral problems and confusion.     Physical Exam Updated Vital Signs BP (!) 151/58 (BP Location: Right Arm)   Pulse 76   Temp 98.1 F (36.7 C) (Oral)   Resp 17   SpO2 100%  Physical Exam Constitutional:      Appearance: Normal appearance. He is normal weight.  Eyes:     Extraocular Movements:  Extraocular movements intact.     Pupils: Pupils are equal, round, and reactive to light.  Cardiovascular:     Rate and Rhythm: Normal rate and regular rhythm.     Heart sounds: Murmur (Previously diagnosed aortic stenosis heard.) heard.  Pulmonary:     Effort: Pulmonary effort is normal.     Breath sounds: Normal breath sounds.  Musculoskeletal:        General: No swelling, tenderness, deformity or signs of injury. Normal range of motion.     Cervical back: Normal range of motion and neck supple. No rigidity or tenderness.  Skin:    General: Skin is warm and dry.  Neurological:     General: No focal deficit present.     Mental Status: He is alert and oriented to person, place, and time. Mental status is at baseline.     ED Results / Procedures / Treatments   Labs (all labs ordered are listed, but only abnormal results are displayed) Labs Reviewed - No data to display  EKG None  Radiology No results found.  Procedures Procedures    Medications Ordered in ED Medications - No data to display  ED Course/ Medical Decision Making/ A&P                                 Medical Decision Making Amount and/or Complexity of Data Reviewed Radiology: ordered.     Patient presents to the ED for concern of mechanical fall, this involves an extensive number of treatment options, and is a complaint that carries with it a high risk of complications and morbidity.  The differential diagnosis includes subdural head bleed, epidural head bleed, syncope, spinal injury, sacral injury   Co morbidities that complicate the patient evaluation  Advanced age Hypertension Diabetes type 2 Aortic stenosis Carotid stenosis   Additional history obtained:  Additional history obtained from  Family, Nursing, Outside Medical Records, and Past Admission   External records from outside source obtained and reviewed including previous office visits.   Lab Tests:  No labs necessary due to  lack of confusion, dizziness, fatigue, abdominal pain, chest pain, back pain, headache, fever.    Imaging Studies ordered:  I ordered imaging studies including CT of head and neck I independently visualized and interpreted imaging which showed no acute pathology. I agree with the radiologist interpretation   Cardiac Monitoring:  No cardiac monitoring was necessary for this patient has no concern for ACS.   Medicines ordered and prescription drug management:  No medications administered in the ER were necessary. I have reviewed the patients home medicines and have made adjustments as needed   Test Considered:  CBC, CMP, UA were considered if confusion, syncope, electrolyte abnormalities could have been present.  However patient showed no concerning symptoms or signs.  Critical Interventions:  No critical  inventions were necessary.   Consultations Obtained:  I requested consultation with attending,  and discussed lab and imaging findings as well as pertinent plan - they recommend: Continuing with head and neck CT.   Problem List / ED Course:  Mechanical fall --patient presents today 5 hours post fall where he slid off the bed hitting his posterior first and then falling backwards onto his head.  He states he has no concerning symptoms, denying headache, dizziness, change in vision, fatigue, confusion, neck pain, back pain, sacral pain, hip pain, chest pain, shortness of breath, abdominal pain.    Reevaluation:  After CT, patient still states that he is not experiencing any symptoms currently.  And is ready to go home.   Social Determinants of Health:  Advanced age   Dispostion:  After consideration of the diagnostic results and the patients response to treatment, I feel that the patent would benefit from going home as there is no concern for injury post fall and no concerning symptoms suggesting pathology present.  CT scans are normal.  Counseled patient on safe  movements and proper technique when transferring from bed to walker.   Final Clinical Impression(s) / ED Diagnoses Final diagnoses:  None    Rx / DC Orders ED Discharge Orders     None         Lavonia Drafts 02/12/23 2033    Melene Plan, DO 02/12/23 2037

## 2023-06-04 ENCOUNTER — Telehealth (HOSPITAL_BASED_OUTPATIENT_CLINIC_OR_DEPARTMENT_OTHER): Payer: Self-pay

## 2023-06-04 ENCOUNTER — Other Ambulatory Visit (HOSPITAL_BASED_OUTPATIENT_CLINIC_OR_DEPARTMENT_OTHER): Payer: Self-pay | Admitting: Neurology

## 2023-06-04 DIAGNOSIS — R413 Other amnesia: Secondary | ICD-10-CM

## 2023-06-12 ENCOUNTER — Ambulatory Visit (HOSPITAL_BASED_OUTPATIENT_CLINIC_OR_DEPARTMENT_OTHER): Admission: RE | Admit: 2023-06-12 | Source: Ambulatory Visit

## 2023-06-16 ENCOUNTER — Ambulatory Visit (HOSPITAL_BASED_OUTPATIENT_CLINIC_OR_DEPARTMENT_OTHER)
Admission: RE | Admit: 2023-06-16 | Discharge: 2023-06-16 | Disposition: A | Discharge status: Home / Self Care | Source: Ambulatory Visit | Attending: Neurology | Admitting: Neurology

## 2023-06-16 DIAGNOSIS — R413 Other amnesia: Secondary | ICD-10-CM | POA: Insufficient documentation

## 2023-06-17 ENCOUNTER — Other Ambulatory Visit: Payer: Self-pay

## 2023-06-17 ENCOUNTER — Emergency Department (HOSPITAL_BASED_OUTPATIENT_CLINIC_OR_DEPARTMENT_OTHER)

## 2023-06-17 ENCOUNTER — Observation Stay (HOSPITAL_BASED_OUTPATIENT_CLINIC_OR_DEPARTMENT_OTHER)
Admission: EM | Admit: 2023-06-17 | Discharge: 2023-06-20 | Disposition: A | Discharge status: Home / Self Care | Attending: Internal Medicine | Admitting: Internal Medicine

## 2023-06-17 ENCOUNTER — Encounter (HOSPITAL_BASED_OUTPATIENT_CLINIC_OR_DEPARTMENT_OTHER): Payer: Self-pay | Admitting: Emergency Medicine

## 2023-06-17 DIAGNOSIS — Z1152 Encounter for screening for COVID-19: Secondary | ICD-10-CM | POA: Diagnosis not present

## 2023-06-17 DIAGNOSIS — Z794 Long term (current) use of insulin: Secondary | ICD-10-CM | POA: Insufficient documentation

## 2023-06-17 DIAGNOSIS — Z79899 Other long term (current) drug therapy: Secondary | ICD-10-CM | POA: Diagnosis not present

## 2023-06-17 DIAGNOSIS — E039 Hypothyroidism, unspecified: Secondary | ICD-10-CM | POA: Diagnosis not present

## 2023-06-17 DIAGNOSIS — R112 Nausea with vomiting, unspecified: Principal | ICD-10-CM | POA: Insufficient documentation

## 2023-06-17 DIAGNOSIS — N179 Acute kidney failure, unspecified: Principal | ICD-10-CM | POA: Insufficient documentation

## 2023-06-17 DIAGNOSIS — I129 Hypertensive chronic kidney disease with stage 1 through stage 4 chronic kidney disease, or unspecified chronic kidney disease: Secondary | ICD-10-CM | POA: Diagnosis not present

## 2023-06-17 DIAGNOSIS — R197 Diarrhea, unspecified: Secondary | ICD-10-CM | POA: Insufficient documentation

## 2023-06-17 DIAGNOSIS — E785 Hyperlipidemia, unspecified: Secondary | ICD-10-CM | POA: Diagnosis not present

## 2023-06-17 DIAGNOSIS — E1122 Type 2 diabetes mellitus with diabetic chronic kidney disease: Secondary | ICD-10-CM | POA: Insufficient documentation

## 2023-06-17 DIAGNOSIS — N400 Enlarged prostate without lower urinary tract symptoms: Secondary | ICD-10-CM | POA: Insufficient documentation

## 2023-06-17 DIAGNOSIS — E43 Unspecified severe protein-calorie malnutrition: Secondary | ICD-10-CM | POA: Insufficient documentation

## 2023-06-17 DIAGNOSIS — Z7901 Long term (current) use of anticoagulants: Secondary | ICD-10-CM | POA: Diagnosis not present

## 2023-06-17 DIAGNOSIS — N184 Chronic kidney disease, stage 4 (severe): Secondary | ICD-10-CM | POA: Diagnosis not present

## 2023-06-17 DIAGNOSIS — Z87891 Personal history of nicotine dependence: Secondary | ICD-10-CM | POA: Diagnosis not present

## 2023-06-17 DIAGNOSIS — E860 Dehydration: Secondary | ICD-10-CM

## 2023-06-17 DIAGNOSIS — R531 Weakness: Secondary | ICD-10-CM | POA: Insufficient documentation

## 2023-06-17 DIAGNOSIS — E119 Type 2 diabetes mellitus without complications: Secondary | ICD-10-CM

## 2023-06-17 DIAGNOSIS — I10 Essential (primary) hypertension: Secondary | ICD-10-CM | POA: Diagnosis present

## 2023-06-17 DIAGNOSIS — R110 Nausea: Secondary | ICD-10-CM | POA: Diagnosis present

## 2023-06-17 DIAGNOSIS — Z931 Gastrostomy status: Secondary | ICD-10-CM | POA: Diagnosis not present

## 2023-06-17 DIAGNOSIS — I959 Hypotension, unspecified: Secondary | ICD-10-CM | POA: Insufficient documentation

## 2023-06-17 DIAGNOSIS — R1110 Vomiting, unspecified: Secondary | ICD-10-CM | POA: Diagnosis present

## 2023-06-17 LAB — I-STAT VENOUS BLOOD GAS, ED
Acid-base deficit: 4 mmol/L — ABNORMAL HIGH (ref 0.0–2.0)
Bicarbonate: 22.2 mmol/L (ref 20.0–28.0)
Calcium, Ion: 1.18 mmol/L (ref 1.15–1.40)
HCT: 33 % — ABNORMAL LOW (ref 39.0–52.0)
Hemoglobin: 11.2 g/dL — ABNORMAL LOW (ref 13.0–17.0)
O2 Saturation: 46 %
Potassium: 4.8 mmol/L (ref 3.5–5.1)
Sodium: 138 mmol/L (ref 135–145)
TCO2: 24 mmol/L (ref 22–32)
pCO2, Ven: 44.7 mmHg (ref 44–60)
pH, Ven: 7.304 (ref 7.25–7.43)
pO2, Ven: 28 mmHg — CL (ref 32–45)

## 2023-06-17 LAB — CBC WITH DIFFERENTIAL/PLATELET
Abs Immature Granulocytes: 0.11 10*3/uL — ABNORMAL HIGH (ref 0.00–0.07)
Basophils Absolute: 0 10*3/uL (ref 0.0–0.1)
Basophils Relative: 0 %
Eosinophils Absolute: 0.2 10*3/uL (ref 0.0–0.5)
Eosinophils Relative: 2 %
HCT: 33.3 % — ABNORMAL LOW (ref 39.0–52.0)
Hemoglobin: 10.5 g/dL — ABNORMAL LOW (ref 13.0–17.0)
Immature Granulocytes: 1 %
Lymphocytes Relative: 10 %
Lymphs Abs: 0.8 10*3/uL (ref 0.7–4.0)
MCH: 27.5 pg (ref 26.0–34.0)
MCHC: 31.5 g/dL (ref 30.0–36.0)
MCV: 87.2 fL (ref 80.0–100.0)
Monocytes Absolute: 0.9 10*3/uL (ref 0.1–1.0)
Monocytes Relative: 12 %
Neutro Abs: 5.9 10*3/uL (ref 1.7–7.7)
Neutrophils Relative %: 75 %
Platelets: 249 10*3/uL (ref 150–400)
RBC: 3.82 MIL/uL — ABNORMAL LOW (ref 4.22–5.81)
RDW: 14.1 % (ref 11.5–15.5)
WBC: 7.9 10*3/uL (ref 4.0–10.5)
nRBC: 0 % (ref 0.0–0.2)

## 2023-06-17 LAB — URINALYSIS, ROUTINE W REFLEX MICROSCOPIC
Bilirubin Urine: NEGATIVE
Glucose, UA: NEGATIVE mg/dL
Hgb urine dipstick: NEGATIVE
Ketones, ur: NEGATIVE mg/dL
Nitrite: NEGATIVE
Protein, ur: 30 mg/dL — AB
Specific Gravity, Urine: 1.01 (ref 1.005–1.030)
pH: 5.5 (ref 5.0–8.0)

## 2023-06-17 LAB — COMPREHENSIVE METABOLIC PANEL
ALT: 15 U/L (ref 0–44)
AST: 15 U/L (ref 15–41)
Albumin: 3.6 g/dL (ref 3.5–5.0)
Alkaline Phosphatase: 77 U/L (ref 38–126)
Anion gap: 10 (ref 5–15)
BUN: 56 mg/dL — ABNORMAL HIGH (ref 8–23)
CO2: 21 mmol/L — ABNORMAL LOW (ref 22–32)
Calcium: 8.8 mg/dL — ABNORMAL LOW (ref 8.9–10.3)
Chloride: 102 mmol/L (ref 98–111)
Creatinine, Ser: 2.76 mg/dL — ABNORMAL HIGH (ref 0.61–1.24)
GFR, Estimated: 21 mL/min — ABNORMAL LOW (ref >60)
Glucose, Bld: 208 mg/dL — ABNORMAL HIGH (ref 70–99)
Potassium: 4.7 mmol/L (ref 3.5–5.1)
Sodium: 133 mmol/L — ABNORMAL LOW (ref 135–145)
Total Bilirubin: 0.3 mg/dL (ref 0.0–1.2)
Total Protein: 7.5 g/dL (ref 6.5–8.1)

## 2023-06-17 LAB — RESP PANEL BY RT-PCR (RSV, FLU A&B, COVID)  RVPGX2
Influenza A by PCR: NEGATIVE
Influenza B by PCR: NEGATIVE
Resp Syncytial Virus by PCR: NEGATIVE
SARS Coronavirus 2 by RT PCR: NEGATIVE

## 2023-06-17 LAB — URINALYSIS, MICROSCOPIC (REFLEX): RBC / HPF: NONE SEEN RBC/hpf (ref 0–5)

## 2023-06-17 LAB — T4, FREE: Free T4: 0.83 ng/dL (ref 0.61–1.12)

## 2023-06-17 LAB — RAPID URINE DRUG SCREEN, HOSP PERFORMED
Amphetamines: NOT DETECTED
Barbiturates: NOT DETECTED
Benzodiazepines: NOT DETECTED
Cocaine: NOT DETECTED
Opiates: POSITIVE — AB
Tetrahydrocannabinol: NOT DETECTED

## 2023-06-17 LAB — TSH: TSH: 4.074 u[IU]/mL (ref 0.350–4.500)

## 2023-06-17 LAB — LACTIC ACID, PLASMA
Lactic Acid, Venous: 2.1 mmol/L (ref 0.5–1.9)
Lactic Acid, Venous: 1.3 mmol/L (ref 0.5–1.9)

## 2023-06-17 LAB — TROPONIN I (HIGH SENSITIVITY): Troponin I (High Sensitivity): 7 ng/L (ref <18)

## 2023-06-17 LAB — BRAIN NATRIURETIC PEPTIDE: B Natriuretic Peptide: 289.6 pg/mL — ABNORMAL HIGH (ref 0.0–100.0)

## 2023-06-17 MED ORDER — LIDOCAINE VISCOUS HCL 2 % MT SOLN
15.0000 mL | Freq: Once | OROMUCOSAL | Status: AC
  Administered 2023-06-17: 15 mL via ORAL
  Filled 2023-06-17: qty 15

## 2023-06-17 MED ORDER — SODIUM CHLORIDE 0.9 % IV BOLUS
1000.0000 mL | Freq: Once | INTRAVENOUS | Status: AC
  Administered 2023-06-17: 1000 mL via INTRAVENOUS

## 2023-06-17 MED ORDER — ALUM & MAG HYDROXIDE-SIMETH 200-200-20 MG/5ML PO SUSP
30.0000 mL | Freq: Once | ORAL | Status: AC
Start: 2023-06-17 — End: 2023-06-17
  Administered 2023-06-17: 30 mL via ORAL
  Filled 2023-06-17: qty 30

## 2023-06-17 MED ORDER — ONDANSETRON HCL 4 MG/2ML IJ SOLN
4.0000 mg | Freq: Once | INTRAMUSCULAR | Status: AC
  Administered 2023-06-17: 4 mg via INTRAVENOUS
  Filled 2023-06-17: qty 2

## 2023-06-17 MED ORDER — SODIUM CHLORIDE 0.9 % IV BOLUS: 1000.0000 mL | Freq: Once | INTRAVENOUS | Status: DC

## 2023-06-17 NOTE — ED Triage Notes (Signed)
 Pt reports seeing [facility] RN today and his BP was low with systolic in the 80s and diastolic in the 50s, also reports tachycardia; pt denies any other sxs including weakness

## 2023-06-17 NOTE — Plan of Care (Signed)
 Plan of Care Note for accepted transfer   Patient name: John Walton WUJ:[id number] DOB: [birth year]  Facility requesting transfer: [facility] ED Requesting Provider: Dr. Wallace Cullens Facility course: 89 year old male with history of right carotid artery stenosis, hypertension, diabetes, hyperlipidemia, CKD stage IIIb-IV, CVA, BPH status post TURP, hyperthyroidism, PAD, CAD, aortic stenosis presented ED with complaints of nausea, vomiting, and diarrhea for the past few days.  Found to be hypotensive with SBP in the 70s and tachycardic to the 110s on arrival.  Afebrile.  Labs showing no leukocytosis, hemoglobin 10.5 (stable), sodium 133, bicarb 21, glucose 208, BUN 56, creatinine 2.7 (previously 2.0 in April 2024), TSH and free T4 pending, lactate 2.1> 1.3, BNP 289, COVID/influenza/RSV PCR negative, troponin negative x 1, UDS positive for opiates.  UA with negative nitrite, trace leukocytes, and microscopy showing 0-5 WBCs and rare bacteria.  CT head negative for acute intracranial abnormality.  Chest x-ray negative for acute finding.  CT abdomen pelvis without contrast showing: "IMPRESSION: 1. Interval PEG tube insertion with the balloon inflated in the body of stomach. 2. Mild to moderate gastric distention with air, fluid and food products. No outlet obstructing mass is seen. No bowel obstruction or inflammation. 3. Markedly enlarged prostate with median lobe irregular lobular protrusion into the bladder, and diffuse thickening of the bladder with slight irregularity and scattered bladder diverticula. Prostatic carcinoma invading the bladder can't be excluded. No obstruction is seen of the distal ureters. 4. Aortic and coronary artery atherosclerosis. 5. Dense calcification with thickening of the aortic valve leaflets. Echocardiographic follow-up is recommended. 6. Constipation and diverticulosis. 7. Osteopenia and degenerative change. 8. Gynecomastia."  Hypotension resolved after 2  L IV fluids and tachycardia improved.  Plan of care: The patient is accepted for admission to Progressive unit at [facility].  [facility] will assume care on arrival to accepting facility. Until arrival, care as per EDP. However, [facility] available 24/7 for questions and assistance.  Check [email or web address] for on-call coverage.  Nursing staff, please call [facility] Admits & Consults System-Wide number under Amion on patient's arrival so appropriate admitting provider can evaluate the pt.

## 2023-06-17 NOTE — ED Notes (Signed)
 Pt assisted to recliner, per pt request.  2 person assistance.

## 2023-06-17 NOTE — ED Provider Notes (Signed)
 [place] [facility] Provider Note  CSN: [id number] Arrival date & time: 06/17/23 1805  Chief Complaint(s) Hypotension  HPI John Walton is a 89 y.o. male with past medical history as below, significant for CKD, CAD, DM, hypertension, HLD, hypothyroid, PVD, prior alcohol abuse who presents to the ED with complaint of hypotension  Patient is here with spouse.  He has been having nausea vomiting diarrhea over the past few days, multiple episodes of liquid bowel movement.  No BRBPR or melena.  Poor p.o. intake last 24 hours.  No fevers or chills.  Has been compliant with home medications.  He was seen by home health nurse and his blood pressure was low and his heart was elevated and was advised come the ER for evaluation.  Past Medical History Past Medical History:  Diagnosis Date   Anemia    Carotid artery occlusion    Chicken pox    Chronic kidney disease    Coronary artery disease    Diabetes mellitus without complication (HCC)    Frequent headaches    Glaucoma    Hay fever    History of blood transfusion    HTN (hypertension)    Hyperlipidemia    Hyperlipidemia    Hypothyroidism    Migraines    Peripheral vascular disease (HCC)    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[facility])    Patient Active Problem List   Diagnosis Date Noted   Central retinal artery occlusion, left eye 07/29/2022   Gastro-esophageal reflux disease without esophagitis 07/29/2022   Nonrheumatic aortic (valve) stenosis 07/29/2022   Stroke-like symptoms    Acute ischemic stroke (HCC) 12/19/2020   S/P TURP (status post transurethral resection of prostate) 12/19/2020   ABLA (acute blood loss anemia) 12/19/2020   Symptomatic stenosis of right carotid artery 12/19/2020   BPH with obstruction/lower urinary tract symptoms 12/14/2020   Hypocalcemia    Protein-calorie malnutrition, severe 03/29/2020   Hypokalemia 03/29/2020   Hypernatremia 03/29/2020   DM (diabetes  mellitus), type 2 (HCC) 03/29/2020   Fever 03/29/2020   Weakness 03/29/2020   Dysphagia 03/26/2020   Bilateral hydronephrosis 03/08/2020   Unilateral primary osteoarthritis, left hip    Femoral neck fracture (HCC) 03/07/2020   CKD (chronic kidney disease), stage IV (HCC) 03/07/2020   Normocytic anemia 03/07/2020   BPH (benign prostatic hyperplasia) 03/07/2020   Abdominal mass 03/07/2020   Scalp hematoma 03/07/2020   Hypertensive urgency 03/07/2020   Pain due to onychomycosis of toenails of both feet 08/13/2019   RBBB 09/02/2014   Screening, ischemic heart disease 07/11/2014   Medicare annual wellness visit, subsequent 07/11/2014   Prostate cancer screening 07/11/2014   Essential hypertension 01/11/2014   Hyperlipidemia 01/11/2014   Home Medication(s) Prior to Admission medications   Medication Sig Start Date End Date Taking? Authorizing Provider  acetaminophen (TYLENOL) 325 MG tablet Take by mouth as needed for pain.    [provider]  aspirin EC 81 MG tablet Take 1 tablet (81 mg total) by mouth daily. 12/26/20   Leroy Sea, MD  atorvastatin (LIPITOR) 40 MG tablet Take 40 mg by mouth daily.    [provider]  Calcium-Vitamin D 500-3.125 MG-MCG TABS Take 1 tablet by mouth in the morning, at noon, and at bedtime.    [provider]  carboxymethylcellulose (REFRESH PLUS) 0.5 % SOLN Place 1 drop into both eyes 6 (six) times daily.    [provider]  collagenase (SANTYL) ointment Apply 1 application topically every  other day. APPLIED TO HEEL WOUND    [provider]  ferrous sulfate 325 (65 FE) MG tablet Take 1 tablet (325 mg total) by mouth daily with breakfast. 12/26/20   Leroy Sea, MD  HYDROcodone-Acetaminophen (NORCO PO) Take 1 tablet by mouth every 6 (six) hours as needed (pain).    [provider]  latanoprost (XALATAN) 0.005 % ophthalmic solution Place 1 drop into both eyes at bedtime.  03/13/19   [provider]  Lifitegrast Benay Spice) 5 % SOLN Place 1 drop into both eyes in the morning and at bedtime.    [provider]  methimazole (TAPAZOLE) 10 MG tablet Take 10 mg by mouth daily.    [provider]  nutrition supplement, JUVEN, (JUVEN) PACK Take 1 packet by mouth 2 (two) times daily between meals.    [provider]  Nutritional Supplements (PROSOURCE) LIQD Give 30 mLs by tube in the morning and at bedtime.    [provider]  pantoprazole (PROTONIX) 40 MG tablet as needed for heartburn.    [provider]  SIMBRINZA 1-0.2 % SUSP Place 1 drop into both eyes in the morning and at bedtime.  05/16/19   [provider]  vitamin B-12 (CYANOCOBALAMIN) 500 MCG tablet Take 1 tablet by mouth 2 (two) times daily. 01/03/21   [provider]  Water For Irrigation, Sterile (FREE WATER) SOLN Place 120 mLs into feeding tube 5 (five) times daily. 04/03/20   Glade Lloyd, MD  White Petrolatum-Mineral Oil (TEARS AGAIN) OINT Place 1 application into both eyes in the morning and at bedtime.    [provider]                                                                                                                                    Past Surgical History Past Surgical History:  Procedure Laterality Date   CHOLECYSTECTOMY, LAPAROSCOPIC     ENDARTERECTOMY Right 12/22/2020   Procedure: RIGHT CAROTID ENDARTERECTOMY;  Surgeon: Chuck Hint, MD;  Location: [facility] OR;  Service: Vascular;  Laterality: Right;   IR GASTROSTOMY TUBE MOD SED  03/29/2020   TONSILLECTOMY  04/01/1940   TOTAL HIP ARTHROPLASTY Left 03/08/2020   Procedure: TOTAL HIP ARTHROPLASTY;  Surgeon: Eldred Manges, MD;  Location: [facility] ORS;  Service: Orthopedics;  Laterality: Left;   TRANSURETHRAL RESECTION OF PROSTATE N/A 12/14/2020   Procedure: TRANSURETHRAL RESECTION OF THE PROSTATE (TURP);  Surgeon: Marcine Matar, MD;  Location: [facility] ORS;  Service: Urology;   Laterality: N/A;  2 HRS   Family History Family History  Problem Relation Age of Onset   Liver disease Mother    Skin cancer Father    Skin cancer Paternal Grandfather    Diabetes Maternal Grandfather     Social History Social History   Tobacco Use   Smoking status: Former    Current packs/day: 0.00    Types: Cigarettes  Start date: 04/01/1968    Quit date: 01/11/1977    Years since quitting: 46.4   Smokeless tobacco: Never  Vaping Use   Vaping status: Never Used  Substance Use Topics   Alcohol use: Not Currently   Drug use: No   Allergies Other, Influenza vaccines, and Robitussin cold cough+ chest [dextromethorphan-guaifenesin]  Review of Systems A thorough review of systems was obtained and all systems are negative except as noted in the HPI and PMH.   Physical Exam Vital Signs  I have reviewed the triage vital signs BP 138/80   Pulse (!) 101   Temp 97.7 F (36.5 C) (Oral)   Resp 16   Ht 5\' 6"  (1.676 m)   Wt 68.9 kg   SpO2 99%   BMI 24.53 kg/m  Physical Exam Vitals and nursing note reviewed.  Constitutional:      General: He is in acute distress.     Appearance: He is well-developed.  HENT:     Head: Normocephalic and atraumatic.     Right Ear: External ear normal.     Left Ear: External ear normal.     Mouth/Throat:     Mouth: Mucous membranes are dry.  Eyes:     General: No scleral icterus. Cardiovascular:     Rate and Rhythm: Regular rhythm. Tachycardia present.     Pulses: Normal pulses.     Heart sounds: Normal heart sounds.  Pulmonary:     Effort: Pulmonary effort is normal. No respiratory distress.     Breath sounds: Normal breath sounds.  Abdominal:     General: Abdomen is flat.     Palpations: Abdomen is soft.     Tenderness: There is no abdominal tenderness.    Musculoskeletal:     Cervical back: No rigidity.     Right lower leg: No edema.     Left lower leg: No edema.  Skin:    General: Skin is warm and dry.     Capillary  Refill: Capillary refill takes less than 2 seconds.  Neurological:     Mental Status: He is alert.  Psychiatric:        Mood and Affect: Mood normal.        Behavior: Behavior normal.     ED Results and Treatments Labs (all labs ordered are listed, but only abnormal results are displayed) Labs Reviewed  CBC WITH DIFFERENTIAL/PLATELET - Abnormal; Notable for the following components:      Result Value   RBC 3.82 (*)    Hemoglobin 10.5 (*)    HCT 33.3 (*)    Abs Immature Granulocytes 0.11 (*)    All other components within normal limits  COMPREHENSIVE METABOLIC PANEL - Abnormal; Notable for the following components:   Sodium 133 (*)    CO2 21 (*)    Glucose, Bld 208 (*)    BUN 56 (*)    Creatinine, Ser 2.76 (*)    Calcium 8.8 (*)    GFR, Estimated 21 (*)    All other components within normal limits  LACTIC ACID, PLASMA - Abnormal; Notable for the following components:   Lactic Acid, Venous 2.1 (*)    All other components within normal limits  URINALYSIS, ROUTINE W REFLEX MICROSCOPIC - Abnormal; Notable for the following components:   Protein, ur 30 (*)    Leukocytes,Ua TRACE (*)    All other components within normal limits  RAPID URINE DRUG SCREEN, HOSP PERFORMED - Abnormal; Notable for the following components:  Opiates POSITIVE (*)    All other components within normal limits  BRAIN NATRIURETIC PEPTIDE - Abnormal; Notable for the following components:   B Natriuretic Peptide 289.6 (*)    All other components within normal limits  URINALYSIS, MICROSCOPIC (REFLEX) - Abnormal; Notable for the following components:   Bacteria, UA RARE (*)    All other components within normal limits  I-STAT VENOUS BLOOD GAS, ED - Abnormal; Notable for the following components:   pO2, Ven 28 (*)    Acid-base deficit 4.0 (*)    HCT 33.0 (*)    Hemoglobin 11.2 (*)    All other components within normal limits  RESP PANEL BY RT-PCR (RSV, FLU A&B, COVID)  RVPGX2  LACTIC ACID, PLASMA  TSH   T4, FREE  TROPONIN I (HIGH SENSITIVITY)                                                                                                                          Radiology CT ABDOMEN PELVIS WO CONTRAST Result Date: 06/17/2023 CLINICAL DATA:  Epigastric pain. EXAM: CT ABDOMEN AND PELVIS WITHOUT CONTRAST TECHNIQUE: Multidetector CT imaging of the abdomen and pelvis was performed following the standard protocol without IV contrast. RADIATION DOSE REDUCTION: This exam was performed according to the departmental dose-optimization program which includes automated exposure control, adjustment of the mA and/or kV according to patient size and/or use of iterative reconstruction technique. COMPARISON:  CT abdomen and pelvis without contrast 03/09/2020. FINDINGS: Lower chest: Lung bases are clear of infiltrates with scattered linear scarring or atelectasis in the lower lobes. The heart is slightly enlarged. There are left main and heavy 2 vessel coronary artery calcifications in the LAD and circumflex arteries. No pericardial effusion. There is dense calcification with thickening of the aortic valve leaflets. Echocardiographic follow-up is recommended. There is bilateral subareolar gynecomastia. Hepatobiliary: No focal liver abnormality is seen without contrast. Status post cholecystectomy. No biliary dilatation. Pancreas: moderately atrophic. Otherwise unremarkable without contrast. Spleen: Unremarkable without contrast.  No splenomegaly. Adrenals/Urinary Tract: There is no adrenal mass. No contour deforming abnormality of the unenhanced kidneys. Perinephric stranding appears similar. There is a 1 cm Bosniak 1 cyst in the superior pole of the left kidney, Hounsfield density is 16. No follow-up imaging recommended. No other focal abnormality is seen in the unenhanced kidneys. There is no urinary stone or obstruction. There is slightly irregular wall thickening of bladder with scattered small diverticula. The irregularly  enlarged prostate protrudes into bladder with marked enlargement of prostate up to 6.8 cm transverse and median lobe lobular protrusion into the bladder. Prostatic carcinoma with bladder invasion is not excluded although at this time there is no obstruction of the ureteral insertions. The bladder is not optimally visualized inferiorly due to spray artifact from a left hip replacement. Stomach/Bowel: PEG tube interval insertion with the balloon inflated in the body of stomach. There is mild to moderate gastric distention with air, fluid and food products. No outlet obstructing mass is  seen. Again noted is a 4.5 cm periampullary duodenal diverticulum. The remaining small bowel is unremarkable and normal caliber. The appendix is not seen. The large bowel is normal in thickness, with mild fecal retention and uncomplicated sigmoid diverticulosis. Vascular/Lymphatic: Aortic atherosclerosis. No enlarged abdominal or pelvic lymph nodes. Reproductive: Markedly enlarged prostate as described above with median lobe irregular lobular protrusion into the bladder and diffuse thickening of the bladder with slight irregularity. Prostate carcinoma invading the bladder can't be excluded. Other: No abdominal wall hernia or abnormality. No abdominopelvic ascites. Musculoskeletal: There is osteopenia with degenerative changes of spine including thoracic spine bridging enthesopathy and prominent lumbar marginal and facet osteophytes. Left hip replacement. 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 No acute or other significant osseous findings or new findings. IMPRESSION: 1. Interval PEG tube insertion with the balloon inflated in the body of stomach. 2. Mild to moderate gastric distention with air, fluid and food products. No outlet obstructing mass is seen. No bowel obstruction or inflammation. 3. Markedly enlarged prostate with median lobe irregular lobular  protrusion into the bladder, and diffuse thickening of the bladder with slight irregularity and scattered bladder diverticula. Prostatic carcinoma invading the bladder can't be excluded. No obstruction is seen of the distal ureters. 4. Aortic and coronary artery atherosclerosis. 5. Dense calcification with thickening of the aortic valve leaflets. Echocardiographic follow-up is recommended. 6. Constipation and diverticulosis. 7. Osteopenia and degenerative change. 8. Gynecomastia. Electronically Signed   By: Almira Bar M.D.   On: 06/17/2023 22:07   DG Chest Portable 1 View Result Date: 06/17/2023 CLINICAL DATA:  Hypotension and tachycardia. EXAM: PORTABLE CHEST 1 VIEW COMPARISON:  PA and lateral chest 07/29/2022. FINDINGS: The heart size and mediastinal contours are within normal limits. There is calcific plaque in the transverse aorta. Both lungs are clear. The visualized skeletal structures are intact, with degenerative changes in spine and bilateral chronic rotator cuff arthropathy. Multiple overlying monitor wires.  Cholecystectomy clips. IMPRESSION: No evidence of acute chest disease. Aortic atherosclerosis. Electronically Signed   By: Almira Bar M.D.   On: 06/17/2023 20:11    Pertinent labs & imaging results that were available during my care of the patient were reviewed by me and considered in my medical decision making (see MDM for details).  Medications Ordered in ED Medications  sodium chloride 0.9 % bolus 1,000 mL (0 mLs Intravenous Stopped 06/17/23 2010)  sodium chloride 0.9 % bolus 1,000 mL (0 mLs Intravenous Stopped 06/17/23 2227)  alum & mag hydroxide-simeth (MAALOX/MYLANTA) 200-200-20 MG/5ML suspension 30 mL (30 mLs Oral Given 06/17/23 2313)    And  lidocaine (XYLOCAINE) 2 % viscous mouth solution 15 mL (15 mLs Oral Given 06/17/23 2313)  Procedures .Critical Care  Performed by: Sloan Leiter, DO Authorized by: Sloan Leiter, DO   Critical care provider statement:    Critical care time (minutes):  30   Critical care time was exclusive of:  Separately billable procedures and treating other patients   Critical care was necessary to treat or prevent imminent or life-threatening deterioration of the following conditions:  Circulatory failure   Critical care was time spent personally by me on the following activities:  Development of treatment plan with patient or surrogate, discussions with consultants, evaluation of patient's response to treatment, examination of patient, ordering and review of laboratory studies, ordering and review of radiographic studies, ordering and performing treatments and interventions, pulse oximetry, re-evaluation of patient's condition, review of old charts and obtaining history from patient or surrogate   Care discussed with: admitting provider     (including critical care time)  Medical Decision Making / ED Course    Medical Decision Making:    DEJON LUKAS is a 89 y.o. male with past medical history as below, significant for CKD, CAD, DM, hypertension, HLD, hypothyroid, PVD, prior alcohol abuse who presents to the ED with complaint of hypotension. The complaint involves an extensive differential diagnosis and also carries with it a high risk of complications and morbidity.  Serious etiology was considered. Ddx includes but is not limited to: Electrolyte derangement, dehydration, medication effect, metabolic syndrome, infection, etc.   Complete initial physical exam performed, notably the patient was in mild distress, he is hypotensive and tachycardic, airway is intact, no hypoxia.    Reviewed and confirmed nursing documentation for past medical history, family history, social history.  Vital signs reviewed.    Clinical Course as of 06/17/23 2323  Tue Jun 17, 2023  1828 Echocardiogram  9/24 LVEF 65 to 70%, indeterminate diastolic filling, mild aortic stenosis   [SG]  1938 Creatinine(!): 2.76 Worsened from baseline which is around 2 [SG]  1938 Hemoglobin(!): 10.5 Similar to prior [SG]  1938 BP(!): 74/53 BP improving with fluids [SG]  2011 105/65 (78)  Pt feeling better [SG]    Clinical Course User Index [SG] Tanda Rockers A, DO    Brief summary: Patient hypotensive on arrival, did improve precipitously with IV fluids.  His creatinine is elevated compared to his baseline.  He has had diarrhea over the past 2 days, poor p.o. intake, likely prerenal, hypovolemia.  He also has elevated BUN.  Chest x-ray was unrevealing.  CT shows gastric distention without obstruction, enlarged prostate with concern for possible prostate carcinoma, no obvious obstruction per radiology.  Calcification noted to aortic valve leaflets recommending follow-up echocardiogram.  Constipation noted with diverticulosis.  Patient with hypotension on arrival, fairly profound, did improve with fluids.  He does have elevated creatinine from his baseline, concern for AKI.  Recommend observation given significant hypotension on arrival continue IV fluid resuscitation. Pt/family agreeabkle  Dr Loney Loh accepting for admit     Additional history obtained: -Additional history obtained from spouse -External records from outside source obtained and reviewed including: Chart review including previous notes, labs, imaging, consultation notes including  Care primarily at the [facility] medications Prior labs   Lab Tests: -I ordered, reviewed, and interpreted labs.   The pertinent results include:   Labs Reviewed  CBC WITH DIFFERENTIAL/PLATELET - Abnormal; Notable for the following components:      Result Value   RBC 3.82 (*)    Hemoglobin 10.5 (*)    HCT 33.3 (*)    Abs  Immature Granulocytes 0.11 (*)    All other components within normal limits  COMPREHENSIVE METABOLIC PANEL - Abnormal; Notable for the  following components:   Sodium 133 (*)    CO2 21 (*)    Glucose, Bld 208 (*)    BUN 56 (*)    Creatinine, Ser 2.76 (*)    Calcium 8.8 (*)    GFR, Estimated 21 (*)    All other components within normal limits  LACTIC ACID, PLASMA - Abnormal; Notable for the following components:   Lactic Acid, Venous 2.1 (*)    All other components within normal limits  URINALYSIS, ROUTINE W REFLEX MICROSCOPIC - Abnormal; Notable for the following components:   Protein, ur 30 (*)    Leukocytes,Ua TRACE (*)    All other components within normal limits  RAPID URINE DRUG SCREEN, HOSP PERFORMED - Abnormal; Notable for the following components:   Opiates POSITIVE (*)    All other components within normal limits  BRAIN NATRIURETIC PEPTIDE - Abnormal; Notable for the following components:   B Natriuretic Peptide 289.6 (*)    All other components within normal limits  URINALYSIS, MICROSCOPIC (REFLEX) - Abnormal; Notable for the following components:   Bacteria, UA RARE (*)    All other components within normal limits  I-STAT VENOUS BLOOD GAS, ED - Abnormal; Notable for the following components:   pO2, Ven 28 (*)    Acid-base deficit 4.0 (*)    HCT 33.0 (*)    Hemoglobin 11.2 (*)    All other components within normal limits  RESP PANEL BY RT-PCR (RSV, FLU A&B, COVID)  RVPGX2  LACTIC ACID, PLASMA  TSH  T4, FREE  TROPONIN I (HIGH SENSITIVITY)    Notable for as above  EKG   EKG Interpretation Date/Time:    Ventricular Rate:    PR Interval:    QRS Duration:    QT Interval:    QTC Calculation:   R Axis:      Text Interpretation:           Imaging Studies ordered: I ordered imaging studies including chest x-ray, CT abdomen pelvis I independently visualized the following imaging with scope of interpretation limited to determining acute life threatening conditions related to emergency care; findings noted above I independently visualized and interpreted imaging. I agree with the radiologist  interpretation   Medicines ordered and prescription drug management: Meds ordered this encounter  Medications   sodium chloride 0.9 % bolus 1,000 mL   DISCONTD: sodium chloride 0.9 % bolus 1,000 mL   sodium chloride 0.9 % bolus 1,000 mL   AND Linked Order Group    alum & mag hydroxide-simeth (MAALOX/MYLANTA) 200-200-20 MG/5ML suspension 30 mL    lidocaine (XYLOCAINE) 2 % viscous mouth solution 15 mL    -I have reviewed the patients home medicines and have made adjustments as needed   Consultations Obtained: I requested consultation with the hospitalist,  and discussed lab and imaging findings as well as pertinent plan    Cardiac Monitoring: The patient was maintained on a cardiac monitor.  I personally viewed and interpreted the cardiac monitored which showed an underlying rhythm of: nsr Continuous pulse oximetry interpreted by myself, 99% on RA.    Social Determinants of Health:  Diagnosis or treatment significantly limited by social determinants of health: former smoker   Reevaluation: After the interventions noted above, I reevaluated the patient and found that they have improved  Co morbidities that complicate the patient evaluation  Past Medical  History:  Diagnosis Date   Anemia    Carotid artery occlusion    Chicken pox    Chronic kidney disease    Coronary artery disease    Diabetes mellitus without complication (HCC)    Frequent headaches    Glaucoma    Hay fever    History of blood transfusion    HTN (hypertension)    Hyperlipidemia    Hyperlipidemia    Hypothyroidism    Migraines    Peripheral vascular disease (HCC)    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(HCC)       Dispostion: Disposition decision including need for hospitalization was considered, and patient admitted to the hospital.    Final Clinical Impression(s) / ED Diagnoses Final diagnoses:  AKI (acute kidney injury) (HCC)  Hypotension, unspecified hypotension type  Dehydration         Sloan Leiter, DO 06/17/23 2323

## 2023-06-17 NOTE — ED Notes (Signed)
 Pt transported to imaging.

## 2023-06-18 ENCOUNTER — Emergency Department (HOSPITAL_BASED_OUTPATIENT_CLINIC_OR_DEPARTMENT_OTHER)

## 2023-06-18 DIAGNOSIS — Z1152 Encounter for screening for COVID-19: Secondary | ICD-10-CM | POA: Diagnosis not present

## 2023-06-18 DIAGNOSIS — E785 Hyperlipidemia, unspecified: Secondary | ICD-10-CM | POA: Diagnosis not present

## 2023-06-18 DIAGNOSIS — I959 Hypotension, unspecified: Secondary | ICD-10-CM | POA: Diagnosis not present

## 2023-06-18 DIAGNOSIS — E1122 Type 2 diabetes mellitus with diabetic chronic kidney disease: Secondary | ICD-10-CM | POA: Diagnosis not present

## 2023-06-18 DIAGNOSIS — Z794 Long term (current) use of insulin: Secondary | ICD-10-CM | POA: Diagnosis not present

## 2023-06-18 DIAGNOSIS — Z931 Gastrostomy status: Secondary | ICD-10-CM | POA: Diagnosis not present

## 2023-06-18 DIAGNOSIS — E039 Hypothyroidism, unspecified: Secondary | ICD-10-CM | POA: Diagnosis not present

## 2023-06-18 DIAGNOSIS — N179 Acute kidney failure, unspecified: Secondary | ICD-10-CM | POA: Diagnosis not present

## 2023-06-18 DIAGNOSIS — R1110 Vomiting, unspecified: Secondary | ICD-10-CM | POA: Diagnosis not present

## 2023-06-18 DIAGNOSIS — I129 Hypertensive chronic kidney disease with stage 1 through stage 4 chronic kidney disease, or unspecified chronic kidney disease: Secondary | ICD-10-CM | POA: Diagnosis not present

## 2023-06-18 DIAGNOSIS — R112 Nausea with vomiting, unspecified: Secondary | ICD-10-CM | POA: Diagnosis not present

## 2023-06-18 DIAGNOSIS — R110 Nausea: Secondary | ICD-10-CM | POA: Diagnosis present

## 2023-06-18 DIAGNOSIS — R531 Weakness: Secondary | ICD-10-CM | POA: Diagnosis not present

## 2023-06-18 DIAGNOSIS — N400 Enlarged prostate without lower urinary tract symptoms: Secondary | ICD-10-CM | POA: Diagnosis not present

## 2023-06-18 DIAGNOSIS — R197 Diarrhea, unspecified: Secondary | ICD-10-CM | POA: Diagnosis not present

## 2023-06-18 DIAGNOSIS — N184 Chronic kidney disease, stage 4 (severe): Secondary | ICD-10-CM | POA: Diagnosis not present

## 2023-06-18 DIAGNOSIS — Z79899 Other long term (current) drug therapy: Secondary | ICD-10-CM | POA: Diagnosis not present

## 2023-06-18 DIAGNOSIS — Z7901 Long term (current) use of anticoagulants: Secondary | ICD-10-CM | POA: Diagnosis not present

## 2023-06-18 DIAGNOSIS — Z87891 Personal history of nicotine dependence: Secondary | ICD-10-CM | POA: Diagnosis not present

## 2023-06-18 DIAGNOSIS — E43 Unspecified severe protein-calorie malnutrition: Secondary | ICD-10-CM | POA: Diagnosis not present

## 2023-06-18 LAB — CBC
HCT: 31.8 % — ABNORMAL LOW (ref 39.0–52.0)
Hemoglobin: 9.4 g/dL — ABNORMAL LOW (ref 13.0–17.0)
MCH: 27.6 pg (ref 26.0–34.0)
MCHC: 29.6 g/dL — ABNORMAL LOW (ref 30.0–36.0)
MCV: 93.3 fL (ref 80.0–100.0)
Platelets: 216 10*3/uL (ref 150–400)
RBC: 3.41 MIL/uL — ABNORMAL LOW (ref 4.22–5.81)
RDW: 14 % (ref 11.5–15.5)
WBC: 10.8 10*3/uL — ABNORMAL HIGH (ref 4.0–10.5)
nRBC: 0 % (ref 0.0–0.2)

## 2023-06-18 LAB — CBG MONITORING, ED: Glucose-Capillary: 146 mg/dL — ABNORMAL HIGH (ref 70–99)

## 2023-06-18 LAB — CREATININE, SERUM
Creatinine, Ser: 2.38 mg/dL — ABNORMAL HIGH (ref 0.61–1.24)
GFR, Estimated: 26 mL/min — ABNORMAL LOW (ref >60)

## 2023-06-18 MED ORDER — LOPERAMIDE HCL 2 MG PO CAPS: 2.0000 mg | ORAL_CAPSULE | ORAL | Status: DC | PRN

## 2023-06-18 MED ORDER — PROSOURCE TF20 ENFIT COMPATIBL EN LIQD
60.0000 mL | Freq: Two times a day (BID) | ENTERAL | Status: DC
Start: 2023-06-18 — End: 2023-06-20
  Administered 2023-06-18 – 2023-06-20 (×4): 60 mL
  Filled 2023-06-18 (×5): qty 60

## 2023-06-18 MED ORDER — METHIMAZOLE 5 MG PO TABS
5.0000 mg | ORAL_TABLET | Freq: Every day | ORAL | Status: DC
  Administered 2023-06-18 – 2023-06-20 (×3): 5 mg via ORAL
  Filled 2023-06-18 (×3): qty 1

## 2023-06-18 MED ORDER — ATORVASTATIN CALCIUM 40 MG PO TABS
40.0000 mg | ORAL_TABLET | Freq: Every day | ORAL | Status: DC
  Filled 2023-06-18: qty 1

## 2023-06-18 MED ORDER — LATANOPROST 0.005 % OP SOLN
1.0000 [drp] | Freq: Every day | OPHTHALMIC | Status: DC
  Administered 2023-06-18 – 2023-06-19 (×2): 1 [drp] via OPHTHALMIC
  Filled 2023-06-18: qty 2.5

## 2023-06-18 MED ORDER — ACETAMINOPHEN 325 MG PO TABS: 650.0000 mg | ORAL_TABLET | Freq: Four times a day (QID) | ORAL | Status: DC | PRN

## 2023-06-18 MED ORDER — FREE WATER
120.0000 mL | Freq: Every day | Status: DC
  Administered 2023-06-18 – 2023-06-19 (×3): 120 mL
  Administered 2023-06-19: 20 mL
  Administered 2023-06-19 – 2023-06-20 (×5): 120 mL

## 2023-06-18 MED ORDER — PANTOPRAZOLE SODIUM 40 MG PO TBEC
40.0000 mg | DELAYED_RELEASE_TABLET | Freq: Every day | ORAL | Status: DC
  Administered 2023-06-18 – 2023-06-20 (×3): 40 mg via ORAL
  Filled 2023-06-18 (×3): qty 1

## 2023-06-18 MED ORDER — BRIMONIDINE TARTRATE 0.2 % OP SOLN
1.0000 [drp] | Freq: Three times a day (TID) | OPHTHALMIC | Status: DC
  Administered 2023-06-18 – 2023-06-19 (×2): 1 [drp] via OPHTHALMIC
  Filled 2023-06-18: qty 5

## 2023-06-18 MED ORDER — CARBOXYMETHYLCELLULOSE SODIUM 0.5 % OP SOLN: 1.0000 [drp] | OPHTHALMIC | Status: DC

## 2023-06-18 MED ORDER — VARENICLINE TARTRATE 0.03 MG/ACT NA SOLN
1.0000 | Freq: Two times a day (BID) | NASAL | Status: DC
  Administered 2023-06-18 – 2023-06-20 (×4): 1 via NASAL

## 2023-06-18 MED ORDER — PROSOURCE PO LIQD: 45.0000 mL | Freq: Two times a day (BID) | ORAL | Status: DC

## 2023-06-18 MED ORDER — SODIUM CHLORIDE 0.9 % IV SOLN: INTRAVENOUS | Status: AC

## 2023-06-18 MED ORDER — HEPARIN SODIUM (PORCINE) 5000 UNIT/ML IJ SOLN
5000.0000 [IU] | Freq: Three times a day (TID) | INTRAMUSCULAR | Status: DC
  Administered 2023-06-18 – 2023-06-20 (×6): 5000 [IU] via SUBCUTANEOUS
  Filled 2023-06-18 (×6): qty 1

## 2023-06-18 MED ORDER — FERROUS SULFATE 325 (65 FE) MG PO TABS
325.0000 mg | ORAL_TABLET | Freq: Every day | ORAL | Status: DC
Start: 2023-06-19 — End: 2023-06-20
  Administered 2023-06-19 – 2023-06-20 (×2): 325 mg via ORAL
  Filled 2023-06-18 (×2): qty 1

## 2023-06-18 MED ORDER — PREDNISOLONE ACETATE 1 % OP SUSP
1.0000 [drp] | Freq: Every day | OPHTHALMIC | Status: DC
  Administered 2023-06-19 – 2023-06-20 (×2): 1 [drp] via OPHTHALMIC
  Filled 2023-06-18 (×2): qty 5

## 2023-06-18 MED ORDER — ACETAMINOPHEN 650 MG RE SUPP: 650.0000 mg | Freq: Four times a day (QID) | RECTAL | Status: DC | PRN

## 2023-06-18 MED ORDER — ONDANSETRON HCL 4 MG PO TABS: 4.0000 mg | ORAL_TABLET | Freq: Four times a day (QID) | ORAL | Status: DC | PRN

## 2023-06-18 MED ORDER — ONDANSETRON HCL 4 MG/2ML IJ SOLN: 4.0000 mg | Freq: Four times a day (QID) | INTRAMUSCULAR | Status: DC | PRN

## 2023-06-18 MED ORDER — BRINZOLAMIDE 1 % OP SUSP
1.0000 [drp] | Freq: Three times a day (TID) | OPHTHALMIC | Status: DC
  Administered 2023-06-18 – 2023-06-19 (×2): 1 [drp] via OPHTHALMIC
  Filled 2023-06-18: qty 10

## 2023-06-18 MED ORDER — ASPIRIN 81 MG PO TBEC
81.0000 mg | DELAYED_RELEASE_TABLET | Freq: Every day | ORAL | Status: DC
  Administered 2023-06-18 – 2023-06-20 (×3): 81 mg via ORAL
  Filled 2023-06-18 (×3): qty 1

## 2023-06-18 MED ORDER — PROSOURCE TF20 ENFIT COMPATIBL EN LIQD
60.0000 mL | Freq: Two times a day (BID) | ENTERAL | Status: DC
  Filled 2023-06-18: qty 60

## 2023-06-18 MED ORDER — POLYVINYL ALCOHOL 1.4 % OP SOLN
1.0000 [drp] | OPHTHALMIC | Status: DC
  Administered 2023-06-18 – 2023-06-20 (×24): 1 [drp] via OPHTHALMIC
  Filled 2023-06-18: qty 15

## 2023-06-18 NOTE — ED Notes (Signed)
 Called [facility] for update on beds, Currently no available beds. Called @ 06:05 am

## 2023-06-18 NOTE — H&P (Signed)
 History and Physical    GOR VESTAL NWG:[id number] DOB: 01/26/1933 DOA: 06/17/2023  PCP: [facility]   Patient coming from: Home  I have personally briefly reviewed patient's old medical records in [facility]  Chief Complaint: Nausea,  vomiting and diarrhea leading to hypotension.  HPI: John Walton is a 89 y.o. male with PMH significant for right carotid artery stenosis, hypertension, diabetes, hyperlipidemia, CKD stage IIIb-IV, CVA, BPH status post TURP, hyperthyroidism, PAD, CAD, aortic stenosis presented ED with complaints of nausea, vomiting, and diarrhea for the past few days.  Patient had home health nurse visit today and he was found to be hypotensive.  His blood pressure was found to be systolic 80 /heart rate found to be 110.  RN told them to take him to the hospital. He reports having dizziness and lightheadedness.  He denies any recent travel, sick contacts or any change in medications.  ED Course: He was hypotensive and tachycardic on arrival, other vitals were stable HR 117, RR 18, BP 74/53, temp 98.7, SpO2 100% on room air Lab include sodium 138, potassium 4.8, chloride 102, bicarb 21, glucose 2 8, BUN 56, creatinine 2.76, calcium 8.8, anion gap 10, alkaline phosphatase 77, albumin 3.6, AST 15, ALT 15, total protein 7.5, BNP 289.6, troponin 7, lactic acid 2.1 which improved to 1.3 with IV hydration, WBC 7.9, hemoglobin 11.2, hematocrit 33.0, platelet 249, RSV influenza, COVID negative UA trace leukocytes otherwise unremarkable. Urine drug screen positive for opiates. CT A/P ; Mild to moderate gastric distention with air, fluid and food products. No outlet obstructing mass is seen. No bowel obstructionor inflammation.  Review of Systems:   Review of Systems  Constitutional: Negative.   HENT: Negative.    Eyes: Negative.   Respiratory: Negative.    Cardiovascular: Negative.   Gastrointestinal:  Positive for abdominal pain, nausea and vomiting.   Genitourinary: Negative.   Musculoskeletal: Negative.   Skin: Negative.   Neurological:  Positive for dizziness and weakness.  Endo/Heme/Allergies: Negative.   Psychiatric/Behavioral: Negative.      Past Medical History:  Diagnosis Date   Anemia    Carotid artery occlusion    Chicken pox    Chronic kidney disease    Coronary artery disease    Diabetes mellitus without complication (HCC)    Frequent headaches    Glaucoma    Hay fever    History of blood transfusion    HTN (hypertension)    Hyperlipidemia    Hyperlipidemia    Hypothyroidism    Migraines    Peripheral vascular disease (HCC)    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[facility])     Past Surgical History:  Procedure Laterality Date   CHOLECYSTECTOMY, LAPAROSCOPIC     ENDARTERECTOMY Right 12/22/2020   Procedure: RIGHT CAROTID ENDARTERECTOMY;  Surgeon: Chuck Hint, MD;  Location: [facility] OR;  Service: Vascular;  Laterality: Right;   IR GASTROSTOMY TUBE MOD SED  03/29/2020   TONSILLECTOMY  04/01/1940   TOTAL HIP ARTHROPLASTY Left 03/08/2020   Procedure: TOTAL HIP ARTHROPLASTY;  Surgeon: Eldred Manges, MD;  Location: [facility] ORS;  Service: Orthopedics;  Laterality: Left;   TRANSURETHRAL RESECTION OF PROSTATE N/A 12/14/2020   Procedure: TRANSURETHRAL RESECTION OF THE PROSTATE (TURP);  Surgeon: Marcine Matar, MD;  Location: [facility] ORS;  Service: Urology;  Laterality: N/A;  2 HRS     reports that he quit smoking about 46 years ago. His smoking use included cigarettes. He started smoking about 55 years ago. He has never  used smokeless tobacco. He reports that he does not currently use alcohol. He reports that he does not use drugs.  Allergies  Allergen Reactions   Other Swelling and Other (See Comments)    ALL COUGH MEDICATIONS   Influenza Vaccines Other (See Comments)    Unknown   Robitussin Cold Cough+ Chest [Dextromethorphan-Guaifenesin] Swelling and Other (See Comments)    Laryngeal edema - any cough syrup; Wife  states Robitussin and ANY cough syrup closes patient's throat    Family History  Problem Relation Age of Onset   Liver disease Mother    Skin cancer Father    Skin cancer Paternal Grandfather    Diabetes Maternal Grandfather    Family history reviewed and not pertinent.  Prior to Admission medications   Medication Sig Start Date End Date Taking? Authorizing Provider  acetaminophen (TYLENOL) 325 MG tablet Take 650 mg by mouth every 6 (six) hours as needed for pain.   Yes [provider]  aspirin EC 81 MG tablet Take 1 tablet (81 mg total) by mouth daily. 12/26/20  Yes Leroy Sea, MD  atorvastatin (LIPITOR) 40 MG tablet Take 40 mg by mouth at bedtime.   Yes [provider]  Calcium Carb-Cholecalciferol (OYSTER SHELL CALCIUM W/D) 500-5 MG-MCG TABS Take 1 tablet by mouth in the morning and at bedtime. 10/31/20  Yes [provider]  Calcium-Vitamin D 500-3.125 MG-MCG TABS Take 1 tablet by mouth in the morning and at bedtime.   Yes [provider]  carboxymethylcellulose (REFRESH PLUS) 0.5 % SOLN Place 1 drop into both eyes every hour while awake.   Yes [provider]  ferrous sulfate 325 (65 FE) MG tablet Take 1 tablet (325 mg total) by mouth daily with breakfast. 12/26/20  Yes Leroy Sea, MD  HYDROcodone-Acetaminophen (NORCO PO) Take 1 tablet by mouth as needed (pain).   Yes [provider]  latanoprost (XALATAN) 0.005 % ophthalmic solution Place 1 drop into both eyes at bedtime.  03/13/19  Yes [provider]  Lifitegrast Benay Spice) 5 % SOLN Place 1 drop into both eyes in the morning and at bedtime.   Yes [provider]  loperamide (IMODIUM) 2 MG capsule Take 2 mg by mouth as needed for diarrhea or loose stools.   Yes [provider]  methimazole (TAPAZOLE) 5 MG tablet Take 5 mg by mouth daily. 04/07/23  Yes [provider]  Nutritional Supplements (FEEDING SUPPLEMENT, OSMOLITE 1.5 CAL,) LIQD  Place 237 mLs into feeding tube as needed (when not getting enough nutrition). 04/03/20  Yes [provider]  Nutritional Supplements (PROSOURCE) LIQD Give 45 mLs by tube in the morning and at bedtime.   Yes [provider]  pantoprazole (PROTONIX) 40 MG tablet Take 40 mg by mouth as needed for heartburn.   Yes [provider]  prednisoLONE acetate (PRED FORTE) 1 % ophthalmic suspension Place 1 drop into the left eye daily. 05/13/23  Yes [provider]  SIMBRINZA 1-0.2 % SUSP Place 1 drop into both eyes in the morning and at bedtime.  05/16/19  Yes [provider]  triamcinolone ointment (KENALOG) 0.1 % Apply 1 Application topically daily as needed (for gastrostomy site). 03/10/23  Yes [provider]  Varenicline Tartrate 0.03 MG/ACT SOLN Place 1 spray into both nostrils 2 (two) times daily. 06/16/23  Yes [provider]  vitamin B-12 (CYANOCOBALAMIN) 500 MCG tablet Take 1 tablet by mouth 2 (two) times daily. 01/03/21  Yes [provider]  Water  For Irrigation, Sterile (FREE WATER) SOLN Place 120 mLs into feeding tube 5 (five) times daily. 04/03/20  Yes Glade Lloyd, MD  White Petrolatum-Mineral Oil (TEARS AGAIN) OINT Place 1 application  into both eyes in the morning and at bedtime. Refresh PM   Yes [provider]    Physical Exam: Vitals:   06/18/23 1100 06/18/23 1132 06/18/23 1204 06/18/23 1307  BP:   (!) 112/56 129/64  Pulse: 92 99 90   Resp: 19 12 (!) 23 18  Temp:   98.8 F (37.1 C) 97.7 F (36.5 C)  TempSrc:   Oral Oral  SpO2: 94% 94% 95% 97%  Weight:      Height:        Constitutional: NAD, calm, comfortable, deconditioned, not in any acute distress. Vitals:   06/18/23 1100 06/18/23 1132 06/18/23 1204 06/18/23 1307  BP:   (!) 112/56 129/64  Pulse: 92 99 90   Resp: 19 12 (!) 23 18  Temp:   98.8 F (37.1 C) 97.7 F (36.5 C)  TempSrc:   Oral Oral  SpO2: 94% 94% 95% 97%  Weight:      Height:        Eyes: PERRL, lids and conjunctivae normal ENMT: Dry Mucous membranes . Posterior pharynx clear of any exudate or lesions. Neck: normal, supple, no masses, no thyromegaly Respiratory: CTA bilaterally, no wheezing, no crackles. Normal respiratory effort. No accessory muscle use.  Cardiovascular: S1 S2 heard, regular rate and rhythm, no murmurs / rubs / gallops. No extremity edema.  Abdomen: Soft,  no tenderness, no masses palpated. No hepatosplenomegaly. Bowel sounds positive.  Musculoskeletal: no clubbing / cyanosis. No joint deformity upper and lower extremities. Good ROM, no contractures.  Skin: no rashes, lesions, ulcers. No induration Neurologic: CN 2-12 grossly intact. Sensation intact, DTR normal. Strength 5/5 in all 4.  Psychiatric: Normal judgment and insight. Alert and oriented x 2. Normal mood.    Labs on Admission: I have personally reviewed following labs and imaging studies  CBC: Recent Labs  Lab 06/17/23 1828 06/17/23 1900 06/18/23 1438  WBC 7.9  --  10.8*  NEUTROABS 5.9  --   --   HGB 10.5* 11.2* 9.4*  HCT 33.3* 33.0* 31.8*  MCV 87.2  --  93.3  PLT 249  --  216   Basic Metabolic Panel: Recent Labs  Lab 06/17/23 1828 06/17/23 1900  NA 133* 138  K 4.7 4.8  CL 102  --   CO2 21*  --   GLUCOSE 208*  --   BUN 56*  --   CREATININE 2.76*  --   CALCIUM 8.8*  --    GFR: Estimated Creatinine Clearance: 16.7 mL/min (A) (by C-G formula based on SCr of 2.76 mg/dL (H)). Liver Function Tests: Recent Labs  Lab 06/17/23 1828  AST 15  ALT 15  ALKPHOS 77  BILITOT 0.3  PROT 7.5  ALBUMIN 3.6   No results for input(s): "LIPASE", "AMYLASE" in the last 168 hours. No results for input(s): "AMMONIA" in the last 168 hours. Coagulation Profile: No results for input(s): "INR", "PROTIME" in the last 168 hours. Cardiac Enzymes: No results for input(s): "CKTOTAL", "CKMB", "CKMBINDEX", "TROPONINI" in the last 168 hours. BNP (last 3 results) No results for input(s):  "PROBNP" in the last 8760 hours. HbA1C: No results for input(s): "HGBA1C" in the last 72 hours. CBG: Recent Labs  Lab 06/18/23 1200  GLUCAP 146*   Lipid Profile: No results for input(s): "CHOL", "HDL", "LDLCALC", "TRIG", "CHOLHDL", "LDLDIRECT"  in the last 72 hours. Thyroid Function Tests: Recent Labs    06/17/23 1828  TSH 4.074  FREET4 0.83   Anemia Panel: No results for input(s): "VITAMINB12", "FOLATE", "FERRITIN", "TIBC", "IRON", "RETICCTPCT" in the last 72 hours. Urine analysis:    Component Value Date/Time   COLORURINE YELLOW 06/17/2023 2147   APPEARANCEUR CLEAR 06/17/2023 2147   LABSPEC 1.010 06/17/2023 2147   PHURINE 5.5 06/17/2023 2147   GLUCOSEU NEGATIVE 06/17/2023 2147   GLUCOSEU 500 (A) 07/11/2014 0850   HGBUR NEGATIVE 06/17/2023 2147   BILIRUBINUR NEGATIVE 06/17/2023 2147   KETONESUR NEGATIVE 06/17/2023 2147   PROTEINUR 30 (A) 06/17/2023 2147   UROBILINOGEN 0.2 07/11/2014 0850   NITRITE NEGATIVE 06/17/2023 2147   LEUKOCYTESUR TRACE (A) 06/17/2023 2147    Radiological Exams on Admission: DG Chest Portable 1 View Result Date: 06/18/2023 CLINICAL DATA:  Possible aspiration EXAM: PORTABLE CHEST 1 VIEW COMPARISON:  X-ray and abdomen pelvis CT 06/17/2023 FINDINGS: Underinflation. Stable cardiopericardial silhouette calcified aorta. Mild linear opacity lung bases again seen. Atelectasis is favored but recommend follow-up. No pneumothorax, effusion. No frank consolidation. Degenerative changes along the spine. IMPRESSION: Bandlike opacity seen at the bases as seen on prior CT. Atelectasis is favored but recommend follow-up Electronically Signed   By: Karen Kays M.D.   On: 06/18/2023 14:14   CT ABDOMEN PELVIS WO CONTRAST Result Date: 06/17/2023 CLINICAL DATA:  Epigastric pain. EXAM: CT ABDOMEN AND PELVIS WITHOUT CONTRAST TECHNIQUE: Multidetector CT imaging of the abdomen and pelvis was performed following the standard protocol without IV contrast. RADIATION DOSE  REDUCTION: This exam was performed according to the departmental dose-optimization program which includes automated exposure control, adjustment of the mA and/or kV according to patient size and/or use of iterative reconstruction technique. COMPARISON:  CT abdomen and pelvis without contrast 03/09/2020. FINDINGS: Lower chest: Lung bases are clear of infiltrates with scattered linear scarring or atelectasis in the lower lobes. The heart is slightly enlarged. There are left main and heavy 2 vessel coronary artery calcifications in the LAD and circumflex arteries. No pericardial effusion. There is dense calcification with thickening of the aortic valve leaflets. Echocardiographic follow-up is recommended. There is bilateral subareolar gynecomastia. Hepatobiliary: No focal liver abnormality is seen without contrast. Status post cholecystectomy. No biliary dilatation. Pancreas: moderately atrophic. Otherwise unremarkable without contrast. Spleen: Unremarkable without contrast.  No splenomegaly. Adrenals/Urinary Tract: There is no adrenal mass. No contour deforming abnormality of the unenhanced kidneys. Perinephric stranding appears similar. There is a 1 cm Bosniak 1 cyst in the superior pole of the left kidney, Hounsfield density is 16. No follow-up imaging recommended. No other focal abnormality is seen in the unenhanced kidneys. There is no urinary stone or obstruction. There is slightly irregular wall thickening of bladder with scattered small diverticula. The irregularly enlarged prostate protrudes into bladder with marked enlargement of prostate up to 6.8 cm transverse and median lobe lobular protrusion into the bladder. Prostatic carcinoma with bladder invasion is not excluded although at this time there is no obstruction of the ureteral insertions. The bladder is not optimally visualized inferiorly due to spray artifact from a left hip replacement. Stomach/Bowel: PEG tube interval insertion with the balloon  inflated in the body of stomach. There is mild to moderate gastric distention with air, fluid and food products. No outlet obstructing mass is seen. Again noted is a 4.5 cm periampullary duodenal diverticulum. The remaining small bowel is unremarkable and normal caliber. The appendix is not seen. The large bowel is normal in thickness, with  mild fecal retention and uncomplicated sigmoid diverticulosis. Vascular/Lymphatic: Aortic atherosclerosis. No enlarged abdominal or pelvic lymph nodes. Reproductive: Markedly enlarged prostate as described above with median lobe irregular lobular protrusion into the bladder and diffuse thickening of the bladder with slight irregularity. Prostate carcinoma invading the bladder can't be excluded. Other: No abdominal wall hernia or abnormality. No abdominopelvic ascites. Musculoskeletal: There is osteopenia with degenerative changes of spine including thoracic spine bridging enthesopathy and prominent lumbar marginal and facet osteophytes. Left hip replacement. 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 No acute or other significant osseous findings or new findings. IMPRESSION: 1. Interval PEG tube insertion with the balloon inflated in the body of stomach. 2. Mild to moderate gastric distention with air, fluid and food products. No outlet obstructing mass is seen. No bowel obstruction or inflammation. 3. Markedly enlarged prostate with median lobe irregular lobular protrusion into the bladder, and diffuse thickening of the bladder with slight irregularity and scattered bladder diverticula. Prostatic carcinoma invading the bladder can't be excluded. No obstruction is seen of the distal ureters. 4. Aortic and coronary artery atherosclerosis. 5. Dense calcification with thickening of the aortic valve leaflets. Echocardiographic follow-up is recommended. 6. Constipation and diverticulosis. 7. Osteopenia and  degenerative change. 8. Gynecomastia. Electronically Signed   By: Almira Bar M.D.   On: 06/17/2023 22:07   DG Chest Portable 1 View Result Date: 06/17/2023 CLINICAL DATA:  Hypotension and tachycardia. EXAM: PORTABLE CHEST 1 VIEW COMPARISON:  PA and lateral chest 07/29/2022. FINDINGS: The heart size and mediastinal contours are within normal limits. There is calcific plaque in the transverse aorta. Both lungs are clear. The visualized skeletal structures are intact, with degenerative changes in spine and bilateral chronic rotator cuff arthropathy. Multiple overlying monitor wires.  Cholecystectomy clips. IMPRESSION: No evidence of acute chest disease. Aortic atherosclerosis. Electronically Signed   By: Almira Bar M.D.   On: 06/17/2023 20:11    EKG: Independently reviewed.  Normal Sinus rhythm. Obtain repeat EKG  Assessment/Plan Principal Problem:   Vomiting and diarrhea Active Problems:   Essential hypertension   Hyperlipidemia   CKD (chronic kidney disease), stage IV (HCC)   BPH (benign prostatic hyperplasia)   Protein-calorie malnutrition, severe   DM (diabetes mellitus), type 2 (HCC)   AKI (acute kidney injury) (HCC)  AKI on CKD stage IIIb: Baseline serum creatinine between 1.8-2.2, creatinine on admission 2.76 Likely due to dehydration from nausea and vomiting. Continue IV hydration.  Avoid nephrotoxic medications  Nausea and vomiting: Continue IV fluids resuscitation Continue Zofran as needed for nausea and vomiting CT A/P; shows mild to moderate gastric distention with air-fluid and food particles , no bowel obstruction or obstructing masses seen  Hypotension: He was found to be hypotensive on arrival in the ED.   Blood pressure has improved with IV fluid resuscitation.  There is no infectious process.  Hyperlipidemia: Continue Lipitor 40 mg daily.  Hyperthyroidism: Continue Tapazole.  Diabetes mellitus type 2; Start regular insulin sliding scale. Carb modified  diet Obtain hemoglobin A1c.  Status post PEG tube: Continue sterile irrigation. Continue tube feeds.  Generalized weakness: PT and OT evaluation   DVT prophylaxis: Heparin Code Status: Full code Family Communication: No family at bedside Disposition Plan:     Status is: Observation The patient remains OBS appropriate and will d/c before 2 midnights.  Admitted for nausea vomiting diarrhea leading to hypotension which has been improved.  Now continue gentle IV  hydration for AKI.   Consults called: None Admission status: Observation   Willeen Niece MD [facility]   If 7PM-7AM, please contact night-coverage   06/18/2023, 3:20 PM

## 2023-06-18 NOTE — Care Management Obs Status (Signed)
 MEDICARE OBSERVATION STATUS NOTIFICATION   Patient Details  Name: John Walton MRN: [id number] Date of Birth: Feb 26, 1933   Medicare Observation Status Notification Given:  Yes    MahabirOlegario Messier, RN 06/18/2023, 3:55 PM

## 2023-06-18 NOTE — TOC Initial Note (Signed)
 Transition of Care [facility]) - Initial/Assessment Note    Patient Details  Name: John Walton MRN: [id number] Date of Birth: 01/29/1933  [facility] CM/SW Contact:    Lanier Clam, RN Phone Number: 06/18/2023, 3:07 PM  Clinical Narrative:Spoke to spouse Erskine Squibb about d/c plans-prefer home but may need St SNF-has gone to [place] in past. Has rw,w/c-transfers-has private duty care-28hrs/week-first light. Has PEG-prosource;free water;spouse indep w/PEG care/flushes;[facility] Ronaldo Miyamoto [facility]-[phone];CSW Delila Pereyra left vm [phone]-Will await PT eval & recc.                   Expected Discharge Plan: Home w Home Health Services (TBD) Barriers to Discharge: Continued Medical Work up   Patient Goals and CMS Choice Patient states their goals for this hospitalization and ongoing recovery are:: Home CMS Medicare.gov Compare Post Acute Care list provided to:: Patient Represenative (must comment) ([name](spouse)) Choice offered to / list presented to : Spouse [place] ownership interest in [facility].provided to:: Spouse    Expected Discharge Plan and Services   Discharge Planning Services: CM Consult Post Acute Care Choice: Skilled Nursing Facility Living arrangements for the past 2 months: Single Family Home                                      Prior Living Arrangements/Services Living arrangements for the past 2 months: Single Family Home Lives with:: Spouse   Do you feel safe going back to the place where you live?: Yes          Current home services: DME, Other (comment) (w/c;rw;custodial level-private duty-28hrs/week)    Activities of Daily Living      Permission Sought/Granted Permission sought to share information with : Case Manager Permission granted to share information with : Yes, Verbal Permission Granted  Share Information with NAME: Case manager           Emotional  Assessment              Admission diagnosis:  Dehydration [E86.0] Vomiting and diarrhea [R11.10, R19.7] AKI (acute kidney injury) (HCC) [N17.9] Nausea vomiting and diarrhea [R11.2, R19.7] Hypotension, unspecified hypotension type [I95.9] Patient Active Problem List   Diagnosis Date Noted   AKI (acute kidney injury) (HCC) 06/18/2023   Vomiting and diarrhea 06/17/2023   Central retinal artery occlusion, left eye 07/29/2022   Gastro-esophageal reflux disease without esophagitis 07/29/2022   Nonrheumatic aortic (valve) stenosis 07/29/2022   Stroke-like symptoms    Acute ischemic stroke (HCC) 12/19/2020   S/P TURP (status post transurethral resection of prostate) 12/19/2020   ABLA (acute blood loss anemia) 12/19/2020   Symptomatic stenosis of right carotid artery 12/19/2020   BPH with obstruction/lower urinary tract symptoms 12/14/2020   Hypocalcemia    Protein-calorie malnutrition, severe 03/29/2020   Hypokalemia 03/29/2020   Hypernatremia 03/29/2020   DM (diabetes mellitus), type 2 (HCC) 03/29/2020   Fever 03/29/2020   Weakness 03/29/2020   Dysphagia 03/26/2020   Bilateral hydronephrosis 03/08/2020   Unilateral primary osteoarthritis, left hip    Femoral neck fracture (HCC) 03/07/2020   CKD (chronic kidney disease), stage IV (HCC) 03/07/2020   Normocytic anemia 03/07/2020   BPH (benign prostatic hyperplasia) 03/07/2020   Abdominal mass 03/07/2020   Scalp hematoma 03/07/2020   Hypertensive urgency 03/07/2020   Pain due to onychomycosis of toenails of both feet 08/13/2019  RBBB 09/02/2014   Screening, ischemic heart disease 07/11/2014   Medicare annual wellness visit, subsequent 07/11/2014   Prostate cancer screening 07/11/2014   Essential hypertension 01/11/2014   Hyperlipidemia 01/11/2014   PCP:  [facility] Pharmacy:   [facility] [address] [place] [address] Phone: [phone] Fax:  [phone]     Social Drivers of Health (SDOH) Social History: SDOH Screenings   Depression (PHQ2-9): Low Risk  (02/13/2021)  Tobacco Use: Medium Risk (06/17/2023)   SDOH Interventions:     Readmission Risk Interventions    12/25/2020    1:34 PM  Readmission Risk Prevention Plan  Transportation Screening Complete  Medication Review (RN Care Manager) Complete  PCP or Specialist appointment within 3-5 days of discharge Complete  HRI or Home Care Consult Complete  SW Recovery Care/Counseling Consult Complete  Palliative Care Screening Not Applicable  Skilled Nursing Facility Not Applicable

## 2023-06-18 NOTE — ED Notes (Signed)
 Pt noted to have increase in coughing. Spoke with wife and reports cough increased after eating grits. Wife reports normally able to eat po without problems, but gets majority of fluids by G tube. Advised wife pt NPO until further notice. EDP aware with new order for portable chest and RT to assess

## 2023-06-19 DIAGNOSIS — R1110 Vomiting, unspecified: Secondary | ICD-10-CM | POA: Diagnosis not present

## 2023-06-19 DIAGNOSIS — R197 Diarrhea, unspecified: Secondary | ICD-10-CM | POA: Diagnosis not present

## 2023-06-19 LAB — CBC
HCT: 28.3 % — ABNORMAL LOW (ref 39.0–52.0)
Hemoglobin: 8.6 g/dL — ABNORMAL LOW (ref 13.0–17.0)
MCH: 27.8 pg (ref 26.0–34.0)
MCHC: 30.4 g/dL (ref 30.0–36.0)
MCV: 91.6 fL (ref 80.0–100.0)
Platelets: 187 10*3/uL (ref 150–400)
RBC: 3.09 MIL/uL — ABNORMAL LOW (ref 4.22–5.81)
RDW: 13.9 % (ref 11.5–15.5)
WBC: 8 10*3/uL (ref 4.0–10.5)
nRBC: 0 % (ref 0.0–0.2)

## 2023-06-19 LAB — COMPREHENSIVE METABOLIC PANEL
ALT: 10 U/L (ref 0–44)
AST: 11 U/L — ABNORMAL LOW (ref 15–41)
Albumin: 2.8 g/dL — ABNORMAL LOW (ref 3.5–5.0)
Alkaline Phosphatase: 56 U/L (ref 38–126)
Anion gap: 5 (ref 5–15)
BUN: 49 mg/dL — ABNORMAL HIGH (ref 8–23)
CO2: 19 mmol/L — ABNORMAL LOW (ref 22–32)
Calcium: 7.9 mg/dL — ABNORMAL LOW (ref 8.9–10.3)
Chloride: 112 mmol/L — ABNORMAL HIGH (ref 98–111)
Creatinine, Ser: 2.2 mg/dL — ABNORMAL HIGH (ref 0.61–1.24)
GFR, Estimated: 28 mL/min — ABNORMAL LOW (ref >60)
Glucose, Bld: 108 mg/dL — ABNORMAL HIGH (ref 70–99)
Potassium: 4.2 mmol/L (ref 3.5–5.1)
Sodium: 136 mmol/L (ref 135–145)
Total Bilirubin: 0.7 mg/dL (ref 0.0–1.2)
Total Protein: 5.9 g/dL — ABNORMAL LOW (ref 6.5–8.1)

## 2023-06-19 LAB — MAGNESIUM: Magnesium: 2.1 mg/dL (ref 1.7–2.4)

## 2023-06-19 LAB — PHOSPHORUS: Phosphorus: 2.7 mg/dL (ref 2.5–4.6)

## 2023-06-19 MED ORDER — BRIMONIDINE TARTRATE 0.2 % OP SOLN
1.0000 [drp] | Freq: Two times a day (BID) | OPHTHALMIC | Status: DC
  Administered 2023-06-19 – 2023-06-20 (×2): 1 [drp] via OPHTHALMIC
  Filled 2023-06-19: qty 5

## 2023-06-19 MED ORDER — ATORVASTATIN CALCIUM 40 MG PO TABS
40.0000 mg | ORAL_TABLET | Freq: Every morning | ORAL | Status: DC
  Administered 2023-06-20: 40 mg via ORAL
  Filled 2023-06-19: qty 1

## 2023-06-19 MED ORDER — BRINZOLAMIDE 1 % OP SUSP
1.0000 [drp] | Freq: Two times a day (BID) | OPHTHALMIC | Status: DC
  Administered 2023-06-19 – 2023-06-20 (×2): 1 [drp] via OPHTHALMIC
  Filled 2023-06-19: qty 10

## 2023-06-19 MED ORDER — MUPIROCIN 2 % EX OINT
TOPICAL_OINTMENT | Freq: Two times a day (BID) | CUTANEOUS | Status: DC
  Filled 2023-06-19: qty 22

## 2023-06-19 NOTE — Plan of Care (Signed)
  Problem: Clinical Measurements: Goal: Ability to maintain clinical measurements within normal limits will improve Outcome: Progressing Goal: Diagnostic test results will improve Outcome: Progressing Goal: Respiratory complications will improve Outcome: Progressing Goal: Cardiovascular complication will be avoided Outcome: Progressing   Problem: Safety: Goal: Ability to remain free from injury will improve Outcome: Progressing

## 2023-06-19 NOTE — Progress Notes (Signed)
 Physical Therapy at bedside evaluating patient per orders.

## 2023-06-19 NOTE — Evaluation (Signed)
 Physical Therapy Evaluation Patient Details Name: John Walton MRN: [id number] DOB: 1932/04/20 Today's Date: 06/19/2023  History of Present Illness  Pt is an 89 y.o. male presenting to ED with complaints of nausea, vomiting, and diarrhea for the past few days. PMH significant for right carotid artery stenosis, hypertension, diabetes, hyperlipidemia, CKD stage IIIb-IV, CVA, BPH status post TURP, hyperthyroidism, PAD, CAD, aortic stenosis, and L THA.   Clinical Impression  Pt is an 89 y.o. male with above HPI resulting in the deficits listed below (see PT Problem List). Pt lives with his wife and reports he is typically MOD I with mobility and ADLs. Pt performed sit to stand transfers with supervision for safety and cues for safe hand placement. Pt ambulated total of ~152ft with RW and CGA. Educated pt on use of AD at this time to maximize safety during recovery, pt agreeable and aware of fall risks. Pt will benefit from skilled PT to maximize functional mobility to increase independence.          If plan is discharge home, recommend the following: A little help with walking and/or transfers;A little help with bathing/dressing/bathroom;Help with stairs or ramp for entrance;Assist for transportation;Assistance with cooking/housework   Can travel by private vehicle        Equipment Recommendations None recommended by PT  Recommendations for Other Services  OT consult    Functional Status Assessment Patient has had a recent decline in their functional status and demonstrates the ability to make significant improvements in function in a reasonable and predictable amount of time.     Precautions / Restrictions Precautions Precautions: Fall Restrictions Weight Bearing Restrictions Per Provider Order: No      Mobility  Bed Mobility               General bed mobility comments: Pt in recliner pre/post session    Transfers Overall transfer level: Needs assistance Equipment  used: Rolling walker (2 wheels) Transfers: Sit to/from Stand Sit to Stand: Supervision           General transfer comment: x2. inital stand pt with use of B UEs on RW and required x2 attempts to stand due to RW lifting from floor. Able to perform again withcues for hand placement on recliner armrest with improved effiiceny and power up with only 1 attempt to rise. supervision for safety    Ambulation/Gait Ambulation/Gait assistance: Contact guard assist Gait Distance (Feet): 100 Feet Assistive device: Rolling walker (2 wheels), None Gait Pattern/deviations: Step-to pattern, Decreased stance time - left, Trunk flexed, Knee flexed in stance - left, Knee flexed in stance - right Gait velocity: decr     General Gait Details: ambuates with B knees flexed and requires cues for close proximity to RW and upright posture. Ambuated ~27ft without use of AD as pt reports he does for "practice" at home. Decreased speed and furniture reaching with decreased step length noted compared to with AD. Increased time to sit and turn to recliner chair using bedrails and chair armrest to steady.  Stairs            Wheelchair Mobility     Tilt Bed    Modified Rankin (Stroke Patients Only)       Balance Overall balance assessment: Needs assistance Sitting-balance support: Feet supported Sitting balance-Leahy Scale: Good     Standing balance support: Bilateral upper extremity supported, No upper extremity supported, During functional activity Standing balance-Leahy Scale: Fair  Pertinent Vitals/Pain Pain Assessment Pain Assessment: No/denies pain    Home Living Family/patient expects to be discharged to:: Private residence Living Arrangements: Spouse/significant other Available Help at Discharge: Family Type of Home: House Home Access: Ramped entrance     Alternate Level Stairs-Number of Steps: has stair lift Home Layout: Two level;1/2 bath  on main level;Bed/bath upstairs (has stair lift) Home Equipment: Rolling Walker (2 wheels);Rollator (4 wheels);Wheelchair - manual;Shower seat Additional Comments: Pt states that he walks for and does exercises every day such as STS (x25) and calf raises as well as other exercises.    Prior Function Prior Level of Function : Independent/Modified Independent             Mobility Comments: uses RW and rollator if going outside       Extremity/Trunk Assessment   Upper Extremity Assessment Upper Extremity Assessment: Generalized weakness    Lower Extremity Assessment Lower Extremity Assessment: RLE deficits/detail;LLE deficits/detail RLE Deficits / Details: grossly 4/5 throughout LLE Deficits / Details: grossly 4-/5 throughout       Communication   Communication Communication: No apparent difficulties Factors Affecting Communication: Hearing impaired    Cognition Arousal: Alert Behavior During Therapy: WFL for tasks assessed/performed   PT - Cognitive impairments: No apparent impairments                         Following commands: Intact       Cueing       General Comments      Exercises     Assessment/Plan    PT Assessment Patient needs continued PT services  PT Problem List Decreased strength;Decreased activity tolerance;Decreased balance;Decreased mobility       PT Treatment Interventions DME instruction;Gait training;Functional mobility training;Therapeutic activities;Therapeutic exercise;Balance training;Patient/family education    PT Goals (Current goals can be found in the Care Plan section)  Acute Rehab PT Goals Patient Stated Goal: Get strength back and maintain independence PT Goal Formulation: With patient Time For Goal Achievement: 07/03/23 Potential to Achieve Goals: Good    Frequency Min 3X/week     Co-evaluation               AM-PAC PT "6 Clicks" Mobility  Outcome Measure Help needed turning from your back to your  side while in a flat bed without using bedrails?: A Little Help needed moving from lying on your back to sitting on the side of a flat bed without using bedrails?: A Little Help needed moving to and from a bed to a chair (including a wheelchair)?: A Little Help needed standing up from a chair using your arms (e.g., wheelchair or bedside chair)?: A Little Help needed to walk in hospital room?: A Little Help needed climbing 3-5 steps with a railing? : A Lot 6 Click Score: 17    End of Session Equipment Utilized During Treatment: Gait belt Activity Tolerance: Patient tolerated treatment well Patient left: in chair;with call bell/phone within reach;with nursing/sitter in room Nurse Communication: Mobility status PT Visit Diagnosis: Unsteadiness on feet (R26.81);Muscle weakness (generalized) (M62.81);Other abnormalities of gait and mobility (R26.89)    Time: 1191-4782 PT Time Calculation (min) (ACUTE ONLY): 22 min   Charges:   PT Evaluation $PT Eval Low Complexity: 1 Low PT Treatments $Therapeutic Activity: 8-22 mins PT General Charges $$ ACUTE PT VISIT: 1 Visit         Lyman Speller PT, DPT  Acute Rehabilitation Services  Office [phone]   06/19/2023, 12:37 PM

## 2023-06-19 NOTE — TOC Progression Note (Addendum)
 Transition of Care [facility]) - Progression Note    Patient Details  Name: John Walton MRN: [id number] Date of Birth: 01-10-1933  [facility] CM/SW Contact  Jomarion Mish, Olegario Messier, RN Phone Number: 06/19/2023, 2:27 PM  Clinical Narrative:  PT no f/u. Spouse [name] aware & agree with d/c plan home.D/c plan home.Has own transport home.     Expected Discharge Plan: Home/Self Care Barriers to Discharge: Continued Medical Work up  Expected Discharge Plan and Services   Discharge Planning Services: CM Consult Post Acute Care Choice: Skilled Nursing Facility Living arrangements for the past 2 months: Single Family Home                                       Social Determinants of Health (SDOH) Interventions SDOH Screenings   Food Insecurity: No Food Insecurity (06/18/2023)  Housing: Patient Declined (06/18/2023)  Transportation Needs: No Transportation Needs (06/18/2023)  Utilities: Not At Risk (06/18/2023)  Depression (PHQ2-9): Low Risk  (02/13/2021)  Social Connections: Patient Declined (06/18/2023)  Tobacco Use: Medium Risk (06/17/2023)    Readmission Risk Interventions    12/25/2020    1:34 PM  Readmission Risk Prevention Plan  Transportation Screening Complete  Medication Review (RN Care Manager) Complete  PCP or Specialist appointment within 3-5 days of discharge Complete  HRI or Home Care Consult Complete  SW Recovery Care/Counseling Consult Complete  Palliative Care Screening Not Applicable  Skilled Nursing Facility Not Applicable

## 2023-06-19 NOTE — Progress Notes (Signed)
 PROGRESS NOTE    John Walton  ZDG:[id number] DOB: [birth year] DOA: 06/17/2023 PCP: [facility]   Brief Narrative: 89 year old male with history of right carotid artery stenosis, hypertension, diabetes, hyperlipidemia, CKD stage IIIb-IV, CVA, BPH status post TURP, hyperthyroidism, PAD, CAD, aortic stenosis presented ED with complaints of nausea, vomiting, and diarrhea for the past few days.he lives at home with his wife.  Home health nurse found his blood pressure was low in the 70s and tachycardia to 130s and asked the patient to go to the ER.  Patient was hypotensive in the ER.  Blood pressure was 74/53.  Tachycardic at 117.  He was also found to be in AKI.   And was admitted for the same. Workup in the ER white count BUN 56 creatinine 2.76 BNP 289 lactic acid 2.1 improved with IV fluids to 1.3 COVID and RSV and flu were negative.  Urine drug screen positive for opiates.   CT abdomen and pelvis mild to moderate gastric distention with air-fluid and food products.  No outlet obstructing mass or obstruction seen. Interval PEG tube insertion with the balloon inflated in the body of stomach.Mild to moderate gastric distention with air, fluid and food products. No outlet obstructing mass is seen. No bowel obstruction or inflammation.Markedly enlarged prostate with median lobe irregular lobular protrusion into the bladder, and diffuse thickening of the bladder with slight irregularity and scattered bladder diverticula. Prostatic carcinoma invading the bladder can't be excluded. No obstruction is seen of the distal ureters.Aortic and coronary artery atherosclerosis. Dense calcification with thickening of the aortic valve leaflets. Echocardiographic follow-up is recommended.Constipation and diverticulosis. Osteopenia and degenerative change. Gynecomastia.   Assessment & Plan:   Principal Problem:   Vomiting and diarrhea Active Problems:   Essential hypertension   Hyperlipidemia    CKD (chronic kidney disease), stage IV (HCC)   BPH (benign prostatic hyperplasia)   Protein-calorie malnutrition, severe   DM (diabetes mellitus), type 2 (HCC)   AKI (acute kidney injury) (HCC)   AKI on CKD stage IIIb: Due to dehydration from nausea vomiting diarrhea and poor p.o. intake.  Creatinine has improved but not yet back to his baseline.  Continue slow IV hydration cautiously.     Nausea and vomiting: Continue IV fluids resuscitation Continue Zofran as needed for nausea and vomiting CT A/P; shows mild to moderate gastric distention with air-fluid and food particles , no bowel obstruction or obstructing masses seen ?  Gastroparesis   Hypotension: He was found to be hypotensive on arrival in the ED.   Blood pressure has improved with IV fluid resuscitation.     Hyperlipidemia: Continue Lipitor 40 mg daily.   Hyperthyroidism: Continue Tapazole.   Diabetes mellitus type 2; last A1c from 2022 was 6.5 follow-up A1c pending. CBG (last 3)  Recent Labs    06/18/23 1200  GLUCAP 146*  Continue SSI   Status post PEG tube: Continue sterile irrigation. Continue tube feeds.   Generalized weakness: PT and OT evaluation   ?  Aspiration speech consulted  Estimated body mass index is 24.53 kg/m as calculated from the following:   Height as of this encounter: 5\' 6"  (1.676 m).   Weight as of this encounter: 68.9 kg.  DVT prophylaxis:Code Status:heparin Family Communication: dw wife Disposition Plan:  Status is: Observation   Consultants: none  Procedures: none Antimicrobials:none  Subjective: Wife at bedside does not feel he is back to his baseline  Cough after liquid intake Ate breakfast ok C/o dizzy at times No N/V/D  since admit  Objective: Vitals:   06/18/23 2108 06/19/23 0107 06/19/23 0153 06/19/23 0915  BP: (!) 142/61 (!) 162/73 (!) 154/78 128/62  Pulse: 89 90  87  Resp: 16 16    Temp: 98.8 F (37.1 C) 98.2 F (36.8 C)    TempSrc: Oral     SpO2: 97%  100%    Weight:      Height:        Intake/Output Summary (Last 24 hours) at 06/19/2023 1109 Last data filed at 06/19/2023 0601 Gross per 24 hour  Intake 828.6 ml  Output --  Net 828.6 ml   Filed Weights   06/17/23 1826  Weight: 68.9 kg    Examination:  General exam: Appears in no acute distress Respiratory system: Clear to auscultation. Respiratory effort normal. Cardiovascular system: S1 & S2 heard, RRR. Systolic murmer Gastrointestinal system: Abdomen is nondistended, soft and nontender. No organomegaly or masses felt. Normal bowel sounds heard.peg in place Central nervous system: Alert and oriented. No focal neurological deficits. Extremities: no edema Data Reviewed: I have personally reviewed following labs and imaging studies  CBC: Recent Labs  Lab 06/17/23 1828 06/17/23 1900 06/18/23 1438 06/19/23 0522  WBC 7.9  --  10.8* 8.0  NEUTROABS 5.9  --   --   --   HGB 10.5* 11.2* 9.4* 8.6*  HCT 33.3* 33.0* 31.8* 28.3*  MCV 87.2  --  93.3 91.6  PLT 249  --  216 187   Basic Metabolic Panel: Recent Labs  Lab 06/17/23 1828 06/17/23 1900 06/18/23 1438 06/19/23 0522  NA 133* 138  --  136  K 4.7 4.8  --  4.2  CL 102  --   --  112*  CO2 21*  --   --  19*  GLUCOSE 208*  --   --  108*  BUN 56*  --   --  49*  CREATININE 2.76*  --  2.38* 2.20*  CALCIUM 8.8*  --   --  7.9*  MG  --   --   --  2.1  PHOS  --   --   --  2.7   GFR: Estimated Creatinine Clearance: 20.9 mL/min (A) (by C-G formula based on SCr of 2.2 mg/dL (H)). Liver Function Tests: Recent Labs  Lab 06/17/23 1828 06/19/23 0522  AST 15 11*  ALT 15 10  ALKPHOS 77 56  BILITOT 0.3 0.7  PROT 7.5 5.9*  ALBUMIN 3.6 2.8*   No results for input(s): "LIPASE", "AMYLASE" in the last 168 hours. No results for input(s): "AMMONIA" in the last 168 hours. Coagulation Profile: No results for input(s): "INR", "PROTIME" in the last 168 hours. Cardiac Enzymes: No results for input(s): "CKTOTAL", "CKMB", "CKMBINDEX",  "TROPONINI" in the last 168 hours. BNP (last 3 results) No results for input(s): "PROBNP" in the last 8760 hours. HbA1C: No results for input(s): "HGBA1C" in the last 72 hours. CBG: Recent Labs  Lab 06/18/23 1200  GLUCAP 146*   Lipid Profile: No results for input(s): "CHOL", "HDL", "LDLCALC", "TRIG", "CHOLHDL", "LDLDIRECT" in the last 72 hours. Thyroid Function Tests: Recent Labs    06/17/23 1828  TSH 4.074  FREET4 0.83   Anemia Panel: No results for input(s): "VITAMINB12", "FOLATE", "FERRITIN", "TIBC", "IRON", "RETICCTPCT" in the last 72 hours. Sepsis Labs: Recent Labs  Lab 06/17/23 1828 06/17/23 2126  LATICACIDVEN 2.1* 1.3    Recent Results (from the past 240 hours)  Resp panel by RT-PCR (RSV, Flu A&B, Covid) Anterior Nasal Swab  Status: None   Collection Time: 06/17/23  6:28 PM   Specimen: Anterior Nasal Swab  Result Value Ref Range Status   SARS Coronavirus 2 by RT PCR NEGATIVE NEGATIVE Final    Comment: (NOTE) SARS-CoV-2 target nucleic acids are NOT DETECTED.  The SARS-CoV-2 RNA is generally detectable in upper respiratory specimens during the acute phase of infection. The lowest concentration of SARS-CoV-2 viral copies this assay can detect is 138 copies/mL. A negative result does not preclude SARS-Cov-2 infection and should not be used as the sole basis for treatment or other patient management decisions. A negative result may occur with  improper specimen collection/handling, submission of specimen other than nasopharyngeal swab, presence of viral mutation(s) within the areas targeted by this assay, and inadequate number of viral copies(<138 copies/mL). A negative result must be combined with clinical observations, patient history, and epidemiological information. The expected result is Negative.  Fact Sheet for Patients:  BloggerCourse.com  Fact Sheet for Healthcare Providers:   SeriousBroker.it  This test is no t yet approved or cleared by the [place] FDA and  has been authorized for detection and/or diagnosis of SARS-CoV-2 by FDA under an Emergency Use Authorization (EUA). This EUA will remain  in effect (meaning this test can be used) for the duration of the COVID-19 declaration under Section 564(b)(1) of the Act, 21 U.S.C.section 360bbb-3(b)(1), unless the authorization is terminated  or revoked sooner.       Influenza A by PCR NEGATIVE NEGATIVE Final   Influenza B by PCR NEGATIVE NEGATIVE Final    Comment: (NOTE) The Xpert Xpress SARS-CoV-2/FLU/RSV plus assay is intended as an aid in the diagnosis of influenza from Nasopharyngeal swab specimens and should not be used as a sole basis for treatment. Nasal washings and aspirates are unacceptable for Xpert Xpress SARS-CoV-2/FLU/RSV testing.  Fact Sheet for Patients: BloggerCourse.com  Fact Sheet for Healthcare Providers: SeriousBroker.it  This test is not yet approved or cleared by the [place] FDA and has been authorized for detection and/or diagnosis of SARS-CoV-2 by FDA under an Emergency Use Authorization (EUA). This EUA will remain in effect (meaning this test can be used) for the duration of the COVID-19 declaration under Section 564(b)(1) of the Act, 21 U.S.C. section 360bbb-3(b)(1), unless the authorization is terminated or revoked.     Resp Syncytial Virus by PCR NEGATIVE NEGATIVE Final    Comment: (NOTE) Fact Sheet for Patients: BloggerCourse.com  Fact Sheet for Healthcare Providers: SeriousBroker.it  This test is not yet approved or cleared by the [place] FDA and has been authorized for detection and/or diagnosis of SARS-CoV-2 by FDA under an Emergency Use Authorization (EUA). This EUA will remain in effect (meaning this test can be used) for  the duration of the COVID-19 declaration under Section 564(b)(1) of the Act, 21 U.S.C. section 360bbb-3(b)(1), unless the authorization is terminated or revoked.  Performed at [facility], [address], [place], [place] [address]          Radiology Studies: DG Chest Portable 1 View Result Date: 06/18/2023 CLINICAL DATA:  Possible aspiration EXAM: PORTABLE CHEST 1 VIEW COMPARISON:  X-ray and abdomen pelvis CT 06/17/2023 FINDINGS: Underinflation. Stable cardiopericardial silhouette calcified aorta. Mild linear opacity lung bases again seen. Atelectasis is favored but recommend follow-up. No pneumothorax, effusion. No frank consolidation. Degenerative changes along the spine. IMPRESSION: Bandlike opacity seen at the bases as seen on prior CT. Atelectasis is favored but recommend follow-up Electronically Signed   By: Piedad Climes.D.  On: 06/18/2023 14:14   CT ABDOMEN PELVIS WO CONTRAST Result Date: 06/17/2023 CLINICAL DATA:  Epigastric pain. EXAM: CT ABDOMEN AND PELVIS WITHOUT CONTRAST TECHNIQUE: Multidetector CT imaging of the abdomen and pelvis was performed following the standard protocol without IV contrast. RADIATION DOSE REDUCTION: This exam was performed according to the departmental dose-optimization program which includes automated exposure control, adjustment of the mA and/or kV according to patient size and/or use of iterative reconstruction technique. COMPARISON:  CT abdomen and pelvis without contrast 03/09/2020. FINDINGS: Lower chest: Lung bases are clear of infiltrates with scattered linear scarring or atelectasis in the lower lobes. The heart is slightly enlarged. There are left main and heavy 2 vessel coronary artery calcifications in the LAD and circumflex arteries. No pericardial effusion. There is dense calcification with thickening of the aortic valve leaflets. Echocardiographic follow-up is recommended. There is bilateral subareolar gynecomastia. Hepatobiliary:  No focal liver abnormality is seen without contrast. Status post cholecystectomy. No biliary dilatation. Pancreas: moderately atrophic. Otherwise unremarkable without contrast. Spleen: Unremarkable without contrast.  No splenomegaly. Adrenals/Urinary Tract: There is no adrenal mass. No contour deforming abnormality of the unenhanced kidneys. Perinephric stranding appears similar. There is a 1 cm Bosniak 1 cyst in the superior pole of the left kidney, Hounsfield density is 16. No follow-up imaging recommended. No other focal abnormality is seen in the unenhanced kidneys. There is no urinary stone or obstruction. There is slightly irregular wall thickening of bladder with scattered small diverticula. The irregularly enlarged prostate protrudes into bladder with marked enlargement of prostate up to 6.8 cm transverse and median lobe lobular protrusion into the bladder. Prostatic carcinoma with bladder invasion is not excluded although at this time there is no obstruction of the ureteral insertions. The bladder is not optimally visualized inferiorly due to spray artifact from a left hip replacement. Stomach/Bowel: PEG tube interval insertion with the balloon inflated in the body of stomach. There is mild to moderate gastric distention with air, fluid and food products. No outlet obstructing mass is seen. Again noted is a 4.5 cm periampullary duodenal diverticulum. The remaining small bowel is unremarkable and normal caliber. The appendix is not seen. The large bowel is normal in thickness, with mild fecal retention and uncomplicated sigmoid diverticulosis. Vascular/Lymphatic: Aortic atherosclerosis. No enlarged abdominal or pelvic lymph nodes. Reproductive: Markedly enlarged prostate as described above with median lobe irregular lobular protrusion into the bladder and diffuse thickening of the bladder with slight irregularity. Prostate carcinoma invading the bladder can't be excluded. Other: No abdominal wall hernia or  abnormality. No abdominopelvic ascites. Musculoskeletal: There is osteopenia with degenerative changes of spine including thoracic spine bridging enthesopathy and prominent lumbar marginal and facet osteophytes. Left hip replacement. 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 No acute or other significant osseous findings or new findings. IMPRESSION: 1. Interval PEG tube insertion with the balloon inflated in the body of stomach. 2. Mild to moderate gastric distention with air, fluid and food products. No outlet obstructing mass is seen. No bowel obstruction or inflammation. 3. Markedly enlarged prostate with median lobe irregular lobular protrusion into the bladder, and diffuse thickening of the bladder with slight irregularity and scattered bladder diverticula. Prostatic carcinoma invading the bladder can't be excluded. No obstruction is seen of the distal ureters. 4. Aortic and coronary artery atherosclerosis. 5. Dense calcification with thickening of the aortic valve leaflets. Echocardiographic follow-up is recommended. 6. Constipation and diverticulosis. 7. Osteopenia and  degenerative change. 8. Gynecomastia. Electronically Signed   By: Almira Bar M.D.   On: 06/17/2023 22:07   DG Chest Portable 1 View Result Date: 06/17/2023 CLINICAL DATA:  Hypotension and tachycardia. EXAM: PORTABLE CHEST 1 VIEW COMPARISON:  PA and lateral chest 07/29/2022. FINDINGS: The heart size and mediastinal contours are within normal limits. There is calcific plaque in the transverse aorta. Both lungs are clear. The visualized skeletal structures are intact, with degenerative changes in spine and bilateral chronic rotator cuff arthropathy. Multiple overlying monitor wires.  Cholecystectomy clips. IMPRESSION: No evidence of acute chest disease. Aortic atherosclerosis. Electronically Signed   By: Almira Bar M.D.   On: 06/17/2023 20:11    Scheduled  Meds:  aspirin EC  81 mg Oral Daily   [START ON 06/20/2023] atorvastatin  40 mg Oral q morning   brinzolamide  1 drop Both Eyes TID   And   brimonidine  1 drop Both Eyes TID   feeding supplement (PROSource TF20)  60 mL Per Tube BID   ferrous sulfate  325 mg Oral Q breakfast   free water  120 mL Per Tube 5 X Daily   heparin  5,000 Units Subcutaneous Q8H   latanoprost  1 drop Both Eyes QHS   methimazole  5 mg Oral Daily   pantoprazole  40 mg Oral Daily   polyvinyl alcohol  1 drop Both Eyes Q1H while awake   prednisoLONE acetate  1 drop Left Eye Daily   Varenicline Tartrate  1 spray Nasal BID   Continuous Infusions:  sodium chloride 75 mL/hr at 06/19/23 0607     LOS: 0 days    Time spent:39 min  Alwyn Ren, MD 06/19/2023, 11:09 AM

## 2023-06-19 NOTE — Progress Notes (Signed)
 SLP Cancellation Note  Patient Details Name: John Walton MRN: [id number] DOB: 1933/03/26   Cancelled treatment:       Reason Eval/Treat Not Completed: (P) Other (comment) (pt receiving care from nurse tech at this time; will continue efforts)  John Infante, MS [facility] SLP Acute Rehab Services Office [phone]  John Walton 06/19/2023, 4:56 PM

## 2023-06-20 ENCOUNTER — Other Ambulatory Visit (HOSPITAL_BASED_OUTPATIENT_CLINIC_OR_DEPARTMENT_OTHER): Payer: Self-pay

## 2023-06-20 DIAGNOSIS — R1110 Vomiting, unspecified: Secondary | ICD-10-CM | POA: Diagnosis not present

## 2023-06-20 DIAGNOSIS — R197 Diarrhea, unspecified: Secondary | ICD-10-CM | POA: Diagnosis not present

## 2023-06-20 MED ORDER — MUPIROCIN 2 % EX OINT
1.0000 | TOPICAL_OINTMENT | Freq: Two times a day (BID) | CUTANEOUS | 0 refills | Status: DC
  Filled 2023-06-20: qty 22, 30d supply, fill #0

## 2023-06-20 MED ORDER — CEPHALEXIN 500 MG PO CAPS
500.0000 mg | ORAL_CAPSULE | Freq: Two times a day (BID) | ORAL | 0 refills | Status: DC
  Filled 2023-06-20: qty 10, 5d supply, fill #0

## 2023-06-20 MED ORDER — CEPHALEXIN 500 MG PO CAPS
500.0000 mg | ORAL_CAPSULE | Freq: Two times a day (BID) | ORAL | Status: DC
  Administered 2023-06-20: 500 mg via ORAL
  Filled 2023-06-20: qty 1

## 2023-06-20 NOTE — TOC Transition Note (Addendum)
 Transition of Care [facility]) - Discharge Note   Patient Details  Name: John Walton MRN: [id number] Date of Birth: [birth year]  Transition of [facility] CM/SW Contact:  Lanier Clam, RN Phone Number: 06/20/2023, 1:33 PM   Clinical Narrative:d/c plan home. [facility] HHOT recc-has private duty care to provide asst w/ADL's.Has own transport home.Patient/spouse aware. No further CM needs.       Final next level of care: Home/Self Care Barriers to Discharge: No Barriers Identified   Patient Goals and CMS Choice Patient states their goals for this hospitalization and ongoing recovery are:: Home CMS Medicare.gov Compare Post Acute Care list provided to:: Patient Choice offered to / list presented to : Patient [place] ownership interest in [facility].provided to:: Patient    Discharge Placement                       Discharge Plan and Services Additional resources added to the After Visit Summary for     Discharge Planning Services: CM Consult Post Acute Care Choice: Skilled Nursing Facility                               Social Drivers of Health (SDOH) Interventions SDOH Screenings   Food Insecurity: No Food Insecurity (06/18/2023)  Housing: Patient Declined (06/18/2023)  Transportation Needs: No Transportation Needs (06/18/2023)  Utilities: Not At Risk (06/18/2023)  Depression (PHQ2-9): Low Risk  (02/13/2021)  Social Connections: Patient Declined (06/18/2023)  Tobacco Use: Medium Risk (06/17/2023)     Readmission Risk Interventions    12/25/2020    1:34 PM  Readmission Risk Prevention Plan  Transportation Screening Complete  Medication Review (RN Care Manager) Complete  PCP or Specialist appointment within 3-5 days of discharge Complete  HRI or Home Care Consult Complete  SW Recovery Care/Counseling Consult Complete  Palliative Care Screening Not Applicable  Skilled Nursing Facility Not Applicable

## 2023-06-20 NOTE — Evaluation (Signed)
 Occupational Therapy Evaluation Patient Details Name: John Walton MRN: [id number] DOB: 01-29-1933 Today's Date: 06/20/2023   History of Present Illness   Pt is an 89 y.o. male presenting to ED with complaints of nausea, vomiting, and diarrhea for the past few days. PMH significant for right carotid artery stenosis, hypertension, diabetes, hyperlipidemia, CKD stage IIIb-IV, CVA, BPH status post TURP, hyperthyroidism, PAD, CAD, aortic stenosis, and L THA.     Clinical Impressions  PTA, patient was living with wife and was independent. Patient now presents with decreased balance, activity tolerance, generalized weakness, visual and hearing impairments impacting BADL's and mobility. Patient requires continued Acute OT services and will benefit from 24/7 family support and assistance with HHOT. Patient denies need for any OT DME at this time reporting he has all needed DME.     If plan is discharge home, recommend the following:   A little help with bathing/dressing/bathroom;Assistance with cooking/housework;A little help with walking and/or transfers;Assist for transportation;Help with stairs or ramp for entrance     Functional Status Assessment   Patient has had a recent decline in their functional status and demonstrates the ability to make significant improvements in function in a reasonable and predictable amount of time.     Equipment Recommendations   None recommended by OT      Precautions/Restrictions   Precautions Precautions: Fall Restrictions Weight Bearing Restrictions Per Provider Order: No     Mobility Bed Mobility Overal bed mobility: Needs Assistance Bed Mobility: Rolling, Supine to Sit Rolling: Contact guard assist, Used rails   Supine to sit: Contact guard, Used rails          Transfers Overall transfer level: Needs assistance Equipment used: Rolling walker (2 wheels) Transfers: Sit to/from Stand Sit to Stand: Supervision            General transfer comment: increased time for forward weight shift and amb around bed to recliner and toilet with CGA      Balance Overall balance assessment: Needs assistance Sitting-balance support: Feet supported Sitting balance-Leahy Scale: Good     Standing balance support: Bilateral upper extremity supported, No upper extremity supported, During functional activity Standing balance-Leahy Scale: Fair                             ADL either performed or assessed with clinical judgement   ADL Overall ADL's : Needs assistance/impaired Eating/Feeding: Set up   Grooming: Wash/dry hands;Wash/dry face;Brushing hair;Set up;Sitting   Upper Body Bathing: Set up;Sitting   Lower Body Bathing: Contact guard assist;Sit to/from stand;Sitting/lateral leans   Upper Body Dressing : Set up;Sitting   Lower Body Dressing: Minimal assistance;Sit to/from stand;Sitting/lateral leans   Toilet Transfer: Contact guard assist Toilet Transfer Details (indicate cue type and reason): ambulatory Toileting- Clothing Manipulation and Hygiene: Minimal assistance       Functional mobility during ADLs: Contact guard assist;Rolling walker (2 wheels) General ADL Comments: increased time and effort     Vision Baseline Vision/History: 1 Wears glasses Ability to See in Adequate Light: 2 Moderately impaired Patient Visual Report: Blurring of vision Vision Assessment?: Vision impaired- to be further tested in functional context     Perception Perception: Within Functional Limits       Praxis Praxis: [facility]       Pertinent Vitals/Pain Pain Assessment Pain Assessment: No/denies pain     Extremity/Trunk Assessment Upper Extremity Assessment Upper Extremity Assessment: Generalized weakness   Lower Extremity Assessment Lower Extremity  Assessment: Defer to PT evaluation   Cervical / Trunk Assessment Cervical / Trunk Assessment: Kyphotic   Communication Communication Communication:  Impaired Factors Affecting Communication: Hearing impaired   Cognition Arousal: Alert Behavior During Therapy: WFL for tasks assessed/performed Cognition: No apparent impairments             OT - Cognition Comments: repetition required due to severe HOH and diminished vision                 Following commands: Intact       Cueing  General Comments   Cueing Techniques: Verbal cues;Tactile cues  no skin issues           Home Living Family/patient expects to be discharged to:: Private residence Living Arrangements: Spouse/significant other Available Help at Discharge: Family Type of Home: House Home Access: Ramped entrance     Home Layout: Two level;1/2 bath on main level;Bed/bath upstairs (has stair lift) Alternate Level Stairs-Number of Steps: has stair lift       Bathroom Toilet: Handicapped height Bathroom Accessibility: Yes How Accessible: Accessible via walker Home Equipment: Rolling Walker (2 wheels);Rollator (4 wheels);Wheelchair - manual;Shower seat   Additional Comments: Pt states that he walks for and does exercises every day such as STS (x25) and calf raises as well as other exercises.      Prior Functioning/Environment Prior Level of Function : Independent/Modified Independent             Mobility Comments: uses RW and rollator if going outside ADLs Comments: independent    OT Problem List: Decreased strength;Decreased activity tolerance;Impaired balance (sitting and/or standing);Impaired vision/perception;Decreased coordination;Decreased knowledge of use of DME or AE;Decreased knowledge of precautions;Cardiopulmonary status limiting activity   OT Treatment/Interventions: Self-care/ADL training;Therapeutic exercise;Neuromuscular education;Energy conservation;DME and/or AE instruction;Therapeutic activities;Visual/perceptual remediation/compensation;Patient/family education;Balance training      OT Goals(Current goals can be found in  the care plan section)   Acute Rehab OT Goals Patient Stated Goal: to get stronger OT Goal Formulation: With patient Time For Goal Achievement: 07/04/23 Potential to Achieve Goals: Good   OT Frequency:  Min 2X/week       AM-PAC OT "6 Clicks" Daily Activity     Outcome Measure Help from another person eating meals?: A Little Help from another person taking care of personal grooming?: A Little Help from another person toileting, which includes using toliet, bedpan, or urinal?: A Little Help from another person bathing (including washing, rinsing, drying)?: A Little Help from another person to put on and taking off regular upper body clothing?: A Little Help from another person to put on and taking off regular lower body clothing?: A Little 6 Click Score: 18   End of Session Equipment Utilized During Treatment: Gait belt;Rolling walker (2 wheels) Nurse Communication: Mobility status  Activity Tolerance: Patient tolerated treatment well Patient left: in chair;with call bell/phone within reach;with chair alarm set  OT Visit Diagnosis: Unsteadiness on feet (R26.81);Muscle weakness (generalized) (M62.81)                Time: 1610-9604 OT Time Calculation (min): 30 min Charges:  OT General Charges $OT Visit: 1 Visit OT Evaluation $OT Eval Moderate Complexity: 1 Mod OT Treatments $Self Care/Home Management : 8-22 mins Trysten Berti OT/L Acute Rehabilitation Department  [phone]  06/20/2023, 11:39 AM

## 2023-06-20 NOTE — Plan of Care (Signed)
  Problem: Clinical Measurements: Goal: Diagnostic test results will improve Outcome: Progressing Goal: Respiratory complications will improve Outcome: Progressing Goal: Cardiovascular complication will be avoided Outcome: Progressing   Problem: Safety: Goal: Ability to remain free from injury will improve Outcome: Progressing

## 2023-06-20 NOTE — Progress Notes (Signed)
   06/20/23 0800  SLP Visit Information  Reason Eval/Treat Not Completed Patient declined, no reason specified (he states "its just a bother in my throat and it doesn't bother me" "I am old and it will be a waste of your time and mine" politely declining eval.)   Rolena Infante, MS [facility] SLP Acute Rehab Services Office [phone]

## 2023-06-20 NOTE — Progress Notes (Addendum)
   06/20/23 1405  SLP Visit Information  SLP Received On 06/20/23  Reason Eval/Treat Not Completed Other (comment) (SLP was contacted by RN that wife desired SLP to return to evaluate pt's swallowing after he refused this am.  Pt is to dc home today per chart review.    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    If wife wants swallow evaluation completed, given history of sensorimotor deficits, lack of sensation to his dysphagia/retention, he will require MBS - which is unable to be accommodated today.   Recommend order an OP MBS and have pt call [phone] to schedule.      SLP has spent excessive time reviewing chart prior to attempt at visit and attempting evaluation this am.  Then reviewed chart again to inform to reasoning for MBS indication including documentation in progress note.  No charge.      Rolena Infante, MS [facility] SLP Acute Rehab Services Office [phone]

## 2023-06-20 NOTE — Discharge Summary (Signed)
 Physician Discharge Summary  John Walton EAV:[id number] DOB: 06-09-1932 DOA: 06/17/2023  [facility]  Admit date: 06/17/2023 Discharge date: 06/20/2023  Admitted From: home Disposition: home Recommendations for Outpatient Follow-up:  Follow up with PCP in 1-2 weeks Please obtain BMP/CBC in one week Please follow up with pcp for MBS   Home Health:yes Equipment/Devices:none Discharge Condition:stable CODE STATUS:full Diet recommendation:cardiac Brief/Interim Summary:  89 year old male with history of right carotid artery stenosis, hypertension, diabetes, hyperlipidemia, CKD stage IIIb-IV, CVA, BPH status post TURP, hyperthyroidism, PAD, CAD, aortic stenosis presented ED with complaints of nausea, vomiting, and diarrhea for the past few days.he lives at home with his wife.  Home health nurse found his blood pressure was low in the 70s and tachycardia to 130s and asked the patient to go to the ER.  Patient was hypotensive in the ER.  Blood pressure was 74/53.  Tachycardic at 117.  He was also found to be in AKI.   And was admitted for the same. Workup in the ER white count BUN 56 creatinine 2.76 BNP 289 lactic acid 2.1 improved with IV fluids to 1.3 COVID and RSV and flu were negative.  Urine drug screen positive for opiates.   CT abdomen and pelvis mild to moderate gastric distention with air-fluid and food products.  No outlet obstructing mass or obstruction seen. Interval PEG tube insertion with the balloon inflated in the body of stomach.Mild to moderate gastric distention with air, fluid and food products. No outlet obstructing mass is seen. No bowel obstruction or inflammation.Markedly enlarged prostate with median lobe irregular lobular protrusion into the bladder, and diffuse thickening of the bladder with slight irregularity and scattered bladder diverticula. Prostatic carcinoma invading the bladder can't be excluded. No obstruction is seen of the distal  ureters.Aortic and coronary artery atherosclerosis. Dense calcification with thickening of the aortic valve leaflets. Echocardiographic follow-up is recommended.Constipation and diverticulosis. Osteopenia and degenerative change. Gynecomastia.     Discharge Diagnoses:  Principal Problem:   Vomiting and diarrhea Active Problems:   Essential hypertension   Hyperlipidemia   CKD (chronic kidney disease), stage IV (HCC)   BPH (benign prostatic hyperplasia)   Protein-calorie malnutrition, severe   DM (diabetes mellitus), type 2 (HCC)   AKI (acute kidney injury) (HCC)     AKI on CKD stage IIIb: Due to dehydration from nausea vomiting diarrhea and poor p.o. intake.  Creatinine has improved and back to his baseline with  IV hydration    Nausea and vomiting:resolved  CT A/P; shows mild to moderate gastric distention with air-fluid and food particles , no bowel obstruction or obstructing masses seen ?  Gastroparesis   Hypotension:resolved with IVF.     Hyperlipidemia: Continue Lipitor 40 mg daily.   Hyperthyroidism: Continue Tapazole.   Diabetes mellitus type 2; last A1c from 2022 was 6.5  CBG (last 3)  Recent Labs (last 2 labs)     Recent Labs    06/18/23 1200  GLUCAP 146*       Status post PEG tube: Continue sterile irrigation. Continue tube feeds.   Generalized weakness: PT and OT evaluation   ?  Aspiration speech consulted patient refused speech evaluation.  She will follow-up with his primary doctors to get an modified barium swallow done as an outpatient  Estimated body mass index is 24.53 kg/m as calculated from the following:   Height as of this encounter: 5\' 6"  (1.676 m).   Weight as of this encounter: 68.9 kg.  Discharge Instructions  Discharge Instructions  Diet - low sodium heart healthy   Complete by: As directed    Increase activity slowly   Complete by: As directed       Allergies as of 06/20/2023       Reactions   Other Swelling, Other  (See Comments)   ALL COUGH MEDICATIONS   Influenza Vaccines Other (See Comments)   Unknown   Robitussin Cold Cough+ Chest [dextromethorphan-guaifenesin] Swelling, Other (See Comments)   Laryngeal edema - any cough syrup; Wife states Robitussin and ANY cough syrup closes patient's throat        Medication List     TAKE these medications    acetaminophen 325 MG tablet Commonly known as: TYLENOL Take 650 mg by mouth every 6 (six) hours as needed for pain.   aspirin EC 81 MG tablet Take 1 tablet (81 mg total) by mouth daily.   atorvastatin 40 MG tablet Commonly known as: LIPITOR Take 40 mg by mouth at bedtime.   Calcium-Vitamin D 500-3.125 MG-MCG Tabs Take 1 tablet by mouth in the morning and at bedtime.   carboxymethylcellulose 0.5 % Soln Commonly known as: REFRESH PLUS Place 1 drop into both eyes every hour while awake.   cyanocobalamin 500 MCG tablet Commonly known as: VITAMIN B12 Take 1 tablet by mouth 2 (two) times daily.   ferrous sulfate 325 (65 FE) MG tablet Take 1 tablet (325 mg total) by mouth daily with breakfast.   free water Soln Place 120 mLs into feeding tube 5 (five) times daily.   latanoprost 0.005 % ophthalmic solution Commonly known as: XALATAN Place 1 drop into both eyes at bedtime.   loperamide 2 MG capsule Commonly known as: IMODIUM Take 2 mg by mouth as needed for diarrhea or loose stools.   methimazole 5 MG tablet Commonly known as: TAPAZOLE Take 5 mg by mouth daily.   mupirocin ointment 2 % Commonly known as: BACTROBAN Place 1 Application into the nose 2 (two) times daily.   NORCO PO Take 1 tablet by mouth as needed (pain).   Oyster Shell Calcium w/D 500-5 MG-MCG Tabs Take 1 tablet by mouth in the morning and at bedtime.   pantoprazole 40 MG tablet Commonly known as: PROTONIX Take 40 mg by mouth as needed for heartburn.   prednisoLONE acetate 1 % ophthalmic suspension Commonly known as: PRED FORTE Place 1 drop into the left  eye daily.   ProSource Liqd Give 45 mLs by tube in the morning and at bedtime.   feeding supplement (OSMOLITE 1.5 CAL) Liqd Place 237 mLs into feeding tube as needed (when not getting enough nutrition).   Simbrinza 1-0.2 % Susp Generic drug: Brinzolamide-Brimonidine Place 1 drop into both eyes in the morning and at bedtime.   Tears Again Oint Place 1 application  into both eyes in the morning and at bedtime. Refresh PM   triamcinolone ointment 0.1 % Commonly known as: KENALOG Apply 1 Application topically daily as needed (for gastrostomy site).   Varenicline Tartrate 0.03 MG/ACT Soln Place 1 spray into both nostrils 2 (two) times daily.   Xiidra 5 % Soln Generic drug: Lifitegrast Place 1 drop into both eyes in the morning and at bedtime.        Follow-up Information     [facility] Follow [facility]   Why: Dr Ronaldo Miyamoto please order an MBS as an outpatient.)) Contact information: [address] [place] [address] [phone]         Gaspar Garbe, MD Follow up.   Specialty:  Internal Medicine Why: please order an MBS as an outpatient as he is coughing with eating and drinking Contact information: [address] [place] [address] [phone]                Allergies  Allergen Reactions   Other Swelling and Other (See Comments)    ALL COUGH MEDICATIONS   Influenza Vaccines Other (See Comments)    Unknown   Robitussin Cold Cough+ Chest [Dextromethorphan-Guaifenesin] Swelling and Other (See Comments)    Laryngeal edema - any cough syrup; Wife states Robitussin and ANY cough syrup closes patient's throat    Consultations:none  Procedures/Studies: DG Chest Portable 1 View Result Date: 06/18/2023 CLINICAL DATA:  Possible aspiration EXAM: PORTABLE CHEST 1 VIEW COMPARISON:  X-ray and abdomen pelvis CT 06/17/2023 FINDINGS: Underinflation. Stable cardiopericardial silhouette calcified aorta. Mild linear opacity lung bases again seen.  Atelectasis is favored but recommend follow-up. No pneumothorax, effusion. No frank consolidation. Degenerative changes along the spine. IMPRESSION: Bandlike opacity seen at the bases as seen on prior CT. Atelectasis is favored but recommend follow-up Electronically Signed   By: Karen Kays M.D.   On: 06/18/2023 14:14   CT ABDOMEN PELVIS WO CONTRAST Result Date: 06/17/2023 CLINICAL DATA:  Epigastric pain. EXAM: CT ABDOMEN AND PELVIS WITHOUT CONTRAST TECHNIQUE: Multidetector CT imaging of the abdomen and pelvis was performed following the standard protocol without IV contrast. RADIATION DOSE REDUCTION: This exam was performed according to the departmental dose-optimization program which includes automated exposure control, adjustment of the mA and/or kV according to patient size and/or use of iterative reconstruction technique. COMPARISON:  CT abdomen and pelvis without contrast 03/09/2020. FINDINGS: Lower chest: Lung bases are clear of infiltrates with scattered linear scarring or atelectasis in the lower lobes. The heart is slightly enlarged. There are left main and heavy 2 vessel coronary artery calcifications in the LAD and circumflex arteries. No pericardial effusion. There is dense calcification with thickening of the aortic valve leaflets. Echocardiographic follow-up is recommended. There is bilateral subareolar gynecomastia. Hepatobiliary: No focal liver abnormality is seen without contrast. Status post cholecystectomy. No biliary dilatation. Pancreas: moderately atrophic. Otherwise unremarkable without contrast. Spleen: Unremarkable without contrast.  No splenomegaly. Adrenals/Urinary Tract: There is no adrenal mass. No contour deforming abnormality of the unenhanced kidneys. Perinephric stranding appears similar. There is a 1 cm Bosniak 1 cyst in the superior pole of the left kidney, Hounsfield density is 16. No follow-up imaging recommended. No other focal abnormality is seen in the unenhanced kidneys.  There is no urinary stone or obstruction. There is slightly irregular wall thickening of bladder with scattered small diverticula. The irregularly enlarged prostate protrudes into bladder with marked enlargement of prostate up to 6.8 cm transverse and median lobe lobular protrusion into the bladder. Prostatic carcinoma with bladder invasion is not excluded although at this time there is no obstruction of the ureteral insertions. The bladder is not optimally visualized inferiorly due to spray artifact from a left hip replacement. Stomach/Bowel: PEG tube interval insertion with the balloon inflated in the body of stomach. There is mild to moderate gastric distention with air, fluid and food products. No outlet obstructing mass is seen. Again noted is a 4.5 cm periampullary duodenal diverticulum. The remaining small bowel is unremarkable and normal caliber. The appendix is not seen. The large bowel is normal in thickness, with mild fecal retention and uncomplicated sigmoid diverticulosis. Vascular/Lymphatic: Aortic atherosclerosis. No enlarged abdominal or pelvic lymph nodes. Reproductive: Markedly enlarged prostate as described above with median lobe irregular  lobular protrusion into the bladder and diffuse thickening of the bladder with slight irregularity. Prostate carcinoma invading the bladder can't be excluded. Other: No abdominal wall hernia or abnormality. No abdominopelvic ascites. Musculoskeletal: There is osteopenia with degenerative changes of spine including thoracic spine bridging enthesopathy and prominent lumbar marginal and facet osteophytes. Left hip replacement. 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 No acute or other significant osseous findings or new findings. IMPRESSION: 1. Interval PEG tube insertion with the balloon inflated in the body of stomach. 2. Mild to moderate gastric distention with air, fluid and food  products. No outlet obstructing mass is seen. No bowel obstruction or inflammation. 3. Markedly enlarged prostate with median lobe irregular lobular protrusion into the bladder, and diffuse thickening of the bladder with slight irregularity and scattered bladder diverticula. Prostatic carcinoma invading the bladder can't be excluded. No obstruction is seen of the distal ureters. 4. Aortic and coronary artery atherosclerosis. 5. Dense calcification with thickening of the aortic valve leaflets. Echocardiographic follow-up is recommended. 6. Constipation and diverticulosis. 7. Osteopenia and degenerative change. 8. Gynecomastia. Electronically Signed   By: Almira Bar M.D.   On: 06/17/2023 22:07   CT HEAD WO CONTRAST ( ) Result Date: 06/17/2023 CLINICAL DATA:  Hypotension, tachycardia, memory loss. EXAM: CT HEAD WITHOUT CONTRAST TECHNIQUE: Contiguous axial images were obtained from the base of the skull through the vertex without intravenous contrast. RADIATION DOSE REDUCTION: This exam was performed according to the departmental dose-optimization program which includes automated exposure control, adjustment of the mA and/or kV according to patient size and/or use of iterative reconstruction technique. COMPARISON:  Head CT 02/12/2023. FINDINGS: Brain: There is encephalomalacia due to a chronic right mid superior frontal lobe infarct. There is mild cerebellar and moderately advanced cerebral atrophy with atrophic ventriculomegaly and moderate to severe small vessel disease of the cerebral white matter. Dystrophic calcifications extend along the falx. No cortical based acute infarct, hemorrhage, mass or midline shift are seen. Vascular: There are calcifications of the carotid siphons. No hyperdense central vessel is seen. Skull: Negative for fractures or focal lesions. Sinuses/Orbits: There are old lens replacements. Otherwise negative orbits. Visualized sinuses are clear. Slightly reverse S-shaped nasal septum.  Left mastoid air cells are clear. There is chronic fluid right mastoid tip. Other right mastoid air cells are clear. Other: None. IMPRESSION: 1. No acute intracranial CT findings or interval changes. 2. Chronic right frontal lobe infarct. 3. Atrophy and small vessel disease. 4. Chronic fluid right mastoid tip. Electronically Signed   By: Almira Bar M.D.   On: 06/17/2023 20:18   DG Chest Portable 1 View Result Date: 06/17/2023 CLINICAL DATA:  Hypotension and tachycardia. EXAM: PORTABLE CHEST 1 VIEW COMPARISON:  PA and lateral chest 07/29/2022. FINDINGS: The heart size and mediastinal contours are within normal limits. There is calcific plaque in the transverse aorta. Both lungs are clear. The visualized skeletal structures are intact, with degenerative changes in spine and bilateral chronic rotator cuff arthropathy. Multiple overlying monitor wires.  Cholecystectomy clips. IMPRESSION: No evidence of acute chest disease. Aortic atherosclerosis. Electronically Signed   By: Almira Bar M.D.   On: 06/17/2023 20:11   (Echo, Carotid, EGD, Colonoscopy, ERCP)    Subjective: Anxious to go home  Discharge Exam: Vitals:   06/20/23 0509 06/20/23 1214  BP: (!) 148/79 127/60  Pulse: (!) 109 90  Resp: 18 18  Temp: 98.8 F (37.1 C) 98 F (36.7 C)  SpO2: 99% 97%   Vitals:   06/19/23 1335 06/19/23 2004 06/20/23 0509 06/20/23 1214  BP: (!) 163/73 (!) 133/39 (!) 148/79 127/60  Pulse: 71 86 (!) 109 90  Resp: 17 18 18 18   Temp: 97.7 F (36.5 C) 97.8 F (36.6 C) 98.8 F (37.1 C) 98 F (36.7 C)  TempSrc: Oral Oral Oral Oral  SpO2: 100% 100% 99% 97%  Weight:      Height:        General: Pt is alert, awake, not in acute distress Cardiovascular: RRR, S1/S2 +, no rubs, no gallops Respiratory: CTA bilaterally, no wheezing, no rhonchi Abdominal: Soft, NT, ND, bowel sounds + Extremities: no edema, no cyanosis    The results of significant diagnostics from this hospitalization (including imaging,  microbiology, ancillary and laboratory) are listed below for reference.     Microbiology: Recent Results (from the past 240 hours)  Resp panel by RT-PCR (RSV, Flu A&B, Covid) Anterior Nasal Swab     Status: None   Collection Time: 06/17/23  6:28 PM   Specimen: Anterior Nasal Swab  Result Value Ref Range Status   SARS Coronavirus 2 by RT PCR NEGATIVE NEGATIVE Final    Comment: (NOTE) SARS-CoV-2 target nucleic acids are NOT DETECTED.  The SARS-CoV-2 RNA is generally detectable in upper respiratory specimens during the acute phase of infection. The lowest concentration of SARS-CoV-2 viral copies this assay can detect is 138 copies/mL. A negative result does not preclude SARS-Cov-2 infection and should not be used as the sole basis for treatment or other patient management decisions. A negative result may occur with  improper specimen collection/handling, submission of specimen other than nasopharyngeal swab, presence of viral mutation(s) within the areas targeted by this assay, and inadequate number of viral copies(<138 copies/mL). A negative result must be combined with clinical observations, patient history, and epidemiological information. The expected result is Negative.  Fact Sheet for Patients:  BloggerCourse.com  Fact Sheet for Healthcare Providers:  SeriousBroker.it  This test is no t yet approved or cleared by the [place] FDA and  has been authorized for detection and/or diagnosis of SARS-CoV-2 by FDA under an Emergency Use Authorization (EUA). This EUA will remain  in effect (meaning this test can be used) for the duration of the COVID-19 declaration under Section 564(b)(1) of the Act, 21 U.S.C.section 360bbb-3(b)(1), unless the authorization is terminated  or revoked sooner.       Influenza A by PCR NEGATIVE NEGATIVE Final   Influenza B by PCR NEGATIVE NEGATIVE Final    Comment: (NOTE) The Xpert Xpress  SARS-CoV-2/FLU/RSV plus assay is intended as an aid in the diagnosis of influenza from Nasopharyngeal swab specimens and should not be used as a sole basis for treatment. Nasal washings and aspirates are unacceptable for Xpert Xpress SARS-CoV-2/FLU/RSV testing.  Fact Sheet for Patients: BloggerCourse.com  Fact Sheet for Healthcare Providers: SeriousBroker.it  This test is not yet approved or cleared by the [place] FDA and has been authorized for detection and/or diagnosis of SARS-CoV-2 by FDA under an Emergency Use Authorization (EUA). This EUA will remain in effect (meaning this test can be used) for the duration of the COVID-19 declaration under Section 564(b)(1) of the Act, 21 U.S.C. section 360bbb-3(b)(1), unless the authorization is terminated or revoked.     Resp Syncytial Virus by PCR NEGATIVE NEGATIVE Final    Comment: (NOTE) Fact Sheet for Patients: BloggerCourse.com  Fact Sheet for Healthcare Providers: SeriousBroker.it  This test is not yet approved or cleared  by the [place] and has been authorized for detection and/or diagnosis of SARS-CoV-2 by FDA under an Emergency Use Authorization (EUA). This EUA will remain in effect (meaning this test can be used) for the duration of the COVID-19 declaration under Section 564(b)(1) of the Act, 21 U.S.C. section 360bbb-3(b)(1), unless the authorization is terminated or revoked.  Performed at [facility], [address] [place], [place] [address]   Aerobic Culture w Gram Stain (superficial specimen)     Status: None (Preliminary result)   Collection Time: 06/19/23  3:39 PM   Specimen: Wound  Result Value Ref Range Status   Specimen Description WOUND PEG SITE  Final   Special Requests NONE  Final   Gram Stain NO WBC SEEN FEW GRAM POSITIVE COCCI   Final   Culture   Final    ABUNDANT  STAPHYLOCOCCUS AUREUS SUSCEPTIBILITIES TO FOLLOW Performed at [facility], [address], [place], [place] [address]    Report Status PENDING  Incomplete     Labs: BNP (last 3 results) Recent Labs    06/17/23 1828  BNP 289.6*   Basic Metabolic Panel: Recent Labs  Lab 06/17/23 1828 06/17/23 1900 06/18/23 1438 06/19/23 0522  NA 133* 138  --  136  K 4.7 4.8  --  4.2  CL 102  --   --  112*  CO2 21*  --   --  19*  GLUCOSE 208*  --   --  108*  BUN 56*  --   --  49*  CREATININE 2.76*  --  2.38* 2.20*  CALCIUM 8.8*  --   --  7.9*  MG  --   --   --  2.1  PHOS  --   --   --  2.7   Liver Function Tests: Recent Labs  Lab 06/17/23 1828 06/19/23 0522  AST 15 11*  ALT 15 10  ALKPHOS 77 56  BILITOT 0.3 0.7  PROT 7.5 5.9*  ALBUMIN 3.6 2.8*   No results for input(s): "LIPASE", "AMYLASE" in the last 168 hours. No results for input(s): "AMMONIA" in the last 168 hours. CBC: Recent Labs  Lab 06/17/23 1828 06/17/23 1900 06/18/23 1438 06/19/23 0522  WBC 7.9  --  10.8* 8.0  NEUTROABS 5.9  --   --   --   HGB 10.5* 11.2* 9.4* 8.6*  HCT 33.3* 33.0* 31.8* 28.3*  MCV 87.2  --  93.3 91.6  PLT 249  --  216 187   Cardiac Enzymes: No results for input(s): "CKTOTAL", "CKMB", "CKMBINDEX", "TROPONINI" in the last 168 hours. BNP: Invalid input(s): "POCBNP" CBG: Recent Labs  Lab 06/18/23 1200  GLUCAP 146*   D-Dimer No results for input(s): "DDIMER" in the last 72 hours. Hgb A1c No results for input(s): "HGBA1C" in the last 72 hours. Lipid Profile No results for input(s): "CHOL", "HDL", "LDLCALC", "TRIG", "CHOLHDL", "LDLDIRECT" in the last 72 hours. Thyroid function studies Recent Labs    06/17/23 1828  TSH 4.074   Anemia work up No results for input(s): "VITAMINB12", "FOLATE", "FERRITIN", "TIBC", "IRON", "RETICCTPCT" in the last 72 hours. Urinalysis    Component Value Date/Time   COLORURINE YELLOW 06/17/2023 2147   APPEARANCEUR CLEAR 06/17/2023 2147   LABSPEC  1.010 06/17/2023 2147   PHURINE 5.5 06/17/2023 2147   GLUCOSEU NEGATIVE 06/17/2023 2147   GLUCOSEU 500 (A) 07/11/2014 0850   HGBUR NEGATIVE 06/17/2023 2147   BILIRUBINUR NEGATIVE 06/17/2023 2147   KETONESUR NEGATIVE 06/17/2023 2147  PROTEINUR 30 (A) 06/17/2023 2147   UROBILINOGEN 0.2 07/11/2014 0850   NITRITE NEGATIVE 06/17/2023 2147   LEUKOCYTESUR TRACE (A) 06/17/2023 2147   Sepsis Labs Recent Labs  Lab 06/17/23 1828 06/18/23 1438 06/19/23 0522  WBC 7.9 10.8* 8.0   Microbiology Recent Results (from the past 240 hours)  Resp panel by RT-PCR (RSV, Flu A&B, Covid) Anterior Nasal Swab     Status: None   Collection Time: 06/17/23  6:28 PM   Specimen: Anterior Nasal Swab  Result Value Ref Range Status   SARS Coronavirus 2 by RT PCR NEGATIVE NEGATIVE Final    Comment: (NOTE) SARS-CoV-2 target nucleic acids are NOT DETECTED.  The SARS-CoV-2 RNA is generally detectable in upper respiratory specimens during the acute phase of infection. The lowest concentration of SARS-CoV-2 viral copies this assay can detect is 138 copies/mL. A negative result does not preclude SARS-Cov-2 infection and should not be used as the sole basis for treatment or other patient management decisions. A negative result may occur with  improper specimen collection/handling, submission of specimen other than nasopharyngeal swab, presence of viral mutation(s) within the areas targeted by this assay, and inadequate number of viral copies(<138 copies/mL). A negative result must be combined with clinical observations, patient history, and epidemiological information. The expected result is Negative.  Fact Sheet for Patients:  BloggerCourse.com  Fact Sheet for Healthcare Providers:  SeriousBroker.it  This test is no t yet approved or cleared by the [place] FDA and  has been authorized for detection and/or diagnosis of SARS-CoV-2 by FDA under an  Emergency Use Authorization (EUA). This EUA will remain  in effect (meaning this test can be used) for the duration of the COVID-19 declaration under Section 564(b)(1) of the Act, 21 U.S.C.section 360bbb-3(b)(1), unless the authorization is terminated  or revoked sooner.       Influenza A by PCR NEGATIVE NEGATIVE Final   Influenza B by PCR NEGATIVE NEGATIVE Final    Comment: (NOTE) The Xpert Xpress SARS-CoV-2/FLU/RSV plus assay is intended as an aid in the diagnosis of influenza from Nasopharyngeal swab specimens and should not be used as a sole basis for treatment. Nasal washings and aspirates are unacceptable for Xpert Xpress SARS-CoV-2/FLU/RSV testing.  Fact Sheet for Patients: BloggerCourse.com  Fact Sheet for Healthcare Providers: SeriousBroker.it  This test is not yet approved or cleared by the [place] FDA and has been authorized for detection and/or diagnosis of SARS-CoV-2 by FDA under an Emergency Use Authorization (EUA). This EUA will remain in effect (meaning this test can be used) for the duration of the COVID-19 declaration under Section 564(b)(1) of the Act, 21 U.S.C. section 360bbb-3(b)(1), unless the authorization is terminated or revoked.     Resp Syncytial Virus by PCR NEGATIVE NEGATIVE Final    Comment: (NOTE) Fact Sheet for Patients: BloggerCourse.com  Fact Sheet for Healthcare Providers: [email or web address]  This test is not yet approved or cleared by the [place] FDA and has been authorized for detection and/or diagnosis of SARS-CoV-2 by FDA under an Emergency Use Authorization (EUA). This EUA will remain in effect (meaning this test can be used) for the duration of the COVID-19 declaration under Section 564(b)(1) of the Act, 21 U.S.C. section 360bbb-3(b)(1), unless the authorization is terminated or revoked.  Performed at [facility], [address] [place], [place] [address]   Aerobic Culture w Gram Stain (superficial specimen)     Status: None (Preliminary result)   Collection Time: 06/19/23  3:39 PM  Specimen: Wound  Result Value Ref Range Status   Specimen Description WOUND PEG SITE  Final   Special Requests NONE  Final   Gram Stain NO WBC SEEN FEW GRAM POSITIVE COCCI   Final   Culture   Final    ABUNDANT STAPHYLOCOCCUS AUREUS SUSCEPTIBILITIES TO FOLLOW Performed at [facility], [address] [place], [place] [address]    Report Status PENDING  Incomplete     Time coordinating discharge: 38 min  SIGNED:   Alwyn Ren, MD  [facility] 06/20/2023, 3:21 PM

## 2023-06-23 LAB — AEROBIC CULTURE W GRAM STAIN (SUPERFICIAL SPECIMEN): Gram Stain: NONE SEEN

## 2023-07-01 ENCOUNTER — Other Ambulatory Visit (HOSPITAL_BASED_OUTPATIENT_CLINIC_OR_DEPARTMENT_OTHER): Payer: Self-pay

## 2023-07-03 ENCOUNTER — Other Ambulatory Visit (HOSPITAL_COMMUNITY): Payer: Self-pay

## 2023-07-03 DIAGNOSIS — R059 Cough, unspecified: Secondary | ICD-10-CM

## 2023-07-03 DIAGNOSIS — R1310 Dysphagia, unspecified: Secondary | ICD-10-CM

## 2023-07-07 NOTE — Therapy (Deleted)
 OUTPATIENT SPEECH LANGUAGE PATHOLOGY SWALLOW EVALUATION   Patient Name: John Walton MRN: [id number] DOB:1932-12-13, 89 y.o., male Today's Date: 07/07/2023  [facility]ING PROVIDER: Teddy Spike, DO  END OF SESSION:   Past Medical History:  Diagnosis Date   Anemia    Carotid artery occlusion    Chicken pox    Chronic kidney disease    Coronary artery disease    Diabetes mellitus without complication (HCC)    Frequent headaches    Glaucoma    Hay fever    History of blood transfusion    HTN (hypertension)    Hyperlipidemia    Hyperlipidemia    Hypothyroidism    Migraines    Peripheral vascular disease (HCC)    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[facility])    Past Surgical History:  Procedure Laterality Date   CHOLECYSTECTOMY, LAPAROSCOPIC     ENDARTERECTOMY Right 12/22/2020   Procedure: RIGHT CAROTID ENDARTERECTOMY;  Surgeon: Chuck Hint, MD;  Location: [facility] OR;  Service: Vascular;  Laterality: Right;   IR GASTROSTOMY TUBE MOD SED  03/29/2020   TONSILLECTOMY  04/01/1940   TOTAL HIP ARTHROPLASTY Left 03/08/2020   Procedure: TOTAL HIP ARTHROPLASTY;  Surgeon: Eldred Manges, MD;  Location: [facility] ORS;  Service: Orthopedics;  Laterality: Left;   TRANSURETHRAL RESECTION OF PROSTATE N/A 12/14/2020   Procedure: TRANSURETHRAL RESECTION OF THE PROSTATE (TURP);  Surgeon: Marcine Matar, MD;  Location: [facility] ORS;  Service: Urology;  Laterality: N/A;  2 HRS   Patient Active Problem List   Diagnosis Date Noted   AKI (acute kidney injury) (HCC) 06/18/2023   Vomiting and diarrhea 06/17/2023   Central retinal artery occlusion, left eye 07/29/2022   Gastro-esophageal reflux disease without esophagitis 07/29/2022   Nonrheumatic aortic (valve) stenosis 07/29/2022   Stroke-like symptoms    Acute ischemic stroke (HCC) 12/19/2020   S/P TURP (status post transurethral resection of prostate) 12/19/2020   ABLA (acute blood loss anemia) 12/19/2020   Symptomatic stenosis of right  carotid artery 12/19/2020   BPH with obstruction/lower urinary tract symptoms 12/14/2020   Hypocalcemia    Protein-calorie malnutrition, severe 03/29/2020   Hypokalemia 03/29/2020   Hypernatremia 03/29/2020   DM (diabetes mellitus), type 2 (HCC) 03/29/2020   Fever 03/29/2020   Weakness 03/29/2020   Dysphagia 03/26/2020   Bilateral hydronephrosis 03/08/2020   Unilateral primary osteoarthritis, left hip    Femoral neck fracture (HCC) 03/07/2020   CKD (chronic kidney disease), stage IV (HCC) 03/07/2020   Normocytic anemia 03/07/2020   BPH (benign prostatic hyperplasia) 03/07/2020   Abdominal mass 03/07/2020   Scalp hematoma 03/07/2020   Hypertensive urgency 03/07/2020   Pain due to onychomycosis of toenails of both feet 08/13/2019   RBBB 09/02/2014   Screening, ischemic heart disease 07/11/2014   Medicare annual wellness visit, subsequent 07/11/2014   Prostate cancer screening 07/11/2014   Essential hypertension 01/11/2014   Hyperlipidemia 01/11/2014    ONSET DATE: 06/30/2023 (referral date)   REFERRING DIAG: R13.10 (ICD-10-CM) - Dysphagia, unspecified  THERAPY DIAG:  No diagnosis found.  Rationale for Evaluation and Treatment: Rehabilitation  SUBJECTIVE:   SUBJECTIVE STATEMENT: *** Pt accompanied by: {accompnied:27141}  PERTINENT HISTORY: ***  PAIN:  Are you having pain? {OPRCPAIN:27236}  FALLS: Has patient fallen in last 6 months?  {OZHYQMVH:84696}  LIVING ENVIRONMENT: Lives with: {OPRC lives with:25569::"lives with their family"} Lives in: {Lives in:[address]70}  PLOF:  Level of assistance: {EXBMWUX:32440} Employment: {SLPemployment:25674}  PATIENT GOALS: ***  OBJECTIVE:  Note: Objective measures were completed at Evaluation unless  otherwise noted. OBJECTIVE:   INSTRUMENTAL SWALLOW STUDY FINDINGS (MBSS) Ordered 07/18/23 Objective swallow impairments: pending MBSS Objective recommended compensations: pending MBSS  COGNITION: Overall cognitive status:  {cognition:24006} Areas of impairment:  {cognitiveimpairmentslp:27409} Functional deficits: ***  SUBJECTIVE DYSPHAGIA REPORTS:  Date of onset: *** Reported symptoms: {dysphagia symptoms:29766}  Current diet: {slpdiet:27196}  Co-morbid voice changes: {yes/no:20286}  FACTORS WHICH MAY INCREASE RISK OF ADVERSE EVENT IN PRESENCE OF ASPIRATION:  General health: {CSE general health:29764}  Risk factors: {CSE risk factors:29763}    ORAL MOTOR EXAMINATION: Overall status: {OMESLP2:27645} Comments: ***  CLINICAL SWALLOW ASSESSMENT:   Dentition: {CSE dentition:29769} Vocal quality at baseline: {VQL:27192} Patient directly observed with POs: {POobserved:27199} Feeding: {slp feeding:27200} Liquids provided by: {SLPliquids:27201} [facility] Swallow Protocol: {Pass/Fail (Optional):210140017} Oral phase signs and symptoms: {SLPoralphase:27202} Pharyngeal phase signs and symptoms: {SLPpharyngealphase:27203}  PATIENT REPORTED OUTCOME MEASURES (PROM): EAT-10: ***                                                                                                                             TREATMENT DATE:  07/08/23:    PATIENT EDUCATION: Education details: *** Person educated: {Person educated:25204} Education method: {Education Method:25205} Education comprehension: {Education Comprehension:25206}   ASSESSMENT:  CLINICAL IMPRESSION: Patient is a 89 y.o. M who was seen today for ST evaluation for dysphagia.   OBJECTIVE IMPAIRMENTS: include dysphagia. These impairments are limiting patient from safety when swallowing. Factors affecting potential to achieve goals and functional outcome are {SLP potential:25450}. Patient will benefit from skilled SLP services to address above impairments and improve overall function.  REHAB POTENTIAL: Good   GOALS: Goals reviewed with patient? Yes  SHORT TERM GOALS: Target date: ***  *** Baseline: Goal status: INITIAL  2.  *** Baseline:  Goal status:  INITIAL  3.  *** Baseline:  Goal status: INITIAL  4.  *** Baseline:  Goal status: INITIAL  5.  *** Baseline:  Goal status: INITIAL  6.  *** Baseline:  Goal status: INITIAL  LONG TERM GOALS: Target date: ***  *** Baseline:  Goal status: INITIAL  2.  *** Baseline:  Goal status: INITIAL  3.  *** Baseline:  Goal status: INITIAL  4.  *** Baseline:  Goal status: INITIAL  5.  *** Baseline:  Goal status: INITIAL  6.  *** Baseline:  Goal status: INITIAL  PLAN:  SLP FREQUENCY: {rehab frequency:25116}  SLP DURATION: {rehab duration:25117}  PLANNED INTERVENTIONS: {SLP treatment/interventions:25449}    Gracy Racer, CCC-SLP 07/07/2023, 9:55 AM

## 2023-07-08 ENCOUNTER — Ambulatory Visit

## 2023-07-10 ENCOUNTER — Encounter: Payer: Self-pay | Admitting: Psychology

## 2023-07-18 ENCOUNTER — Ambulatory Visit (HOSPITAL_COMMUNITY)
Admission: RE | Admit: 2023-07-18 | Discharge: 2023-07-18 | Disposition: A | Discharge status: Home / Self Care | Source: Ambulatory Visit | Attending: *Deleted | Admitting: *Deleted

## 2023-07-18 ENCOUNTER — Ambulatory Visit (HOSPITAL_COMMUNITY)
Admission: RE | Admit: 2023-07-18 | Discharge: 2023-07-18 | Disposition: A | Discharge status: Home / Self Care | Source: Ambulatory Visit | Attending: Internal Medicine | Admitting: Internal Medicine

## 2023-07-18 DIAGNOSIS — M2578 Osteophyte, vertebrae: Secondary | ICD-10-CM | POA: Insufficient documentation

## 2023-07-18 DIAGNOSIS — R1313 Dysphagia, pharyngeal phase: Secondary | ICD-10-CM | POA: Insufficient documentation

## 2023-07-18 DIAGNOSIS — R1310 Dysphagia, unspecified: Secondary | ICD-10-CM

## 2023-07-18 DIAGNOSIS — R059 Cough, unspecified: Secondary | ICD-10-CM | POA: Insufficient documentation

## 2023-07-18 DIAGNOSIS — N1832 Chronic kidney disease, stage 3b: Secondary | ICD-10-CM | POA: Diagnosis not present

## 2023-07-18 NOTE — Progress Notes (Signed)
 Modified Barium Swallow Study  Patient Details  Name: MARKEESE BOYAJIAN MRN: [id number] Date of Birth: 27-Apr-1932  Today's Date: 07/18/2023  Modified Barium Swallow completed.  Full report located under Chart Review in the Imaging Section.  History of Present Illness 89 y.o. male referred for OP MBS.  PMHx of right carotid artery stenosis, hypertension, diabetes, hyperlipidemia, CKD stage IIIb-IV, CVA, BPH status post TURP, hyperthyroidism, PAD, CAD, aortic stenosis, PEG for hydration (per spouse). CT cervical spine 02/12/23:  Large flowing anterior osteophytes throughout the cervical spine and upper thoracic spine. Jan 2022 CT head/ceck demonstrated DISH with large osteophyte at C5-C6 causing mass-effect on the esophagus.  Same finding noted on imaging in [place]  in 2011. MBS 04/03/20 - impaired epiglottic closure secondary to DISH and deconditioning, decreased laryngeal elevation, pharyngeal retention - nectar and thin liquids recommended. Pt referred for OP MBS - frequent coughing wiht PO intake, describes sensation of something "stuck in throat."   Clinical Impression Pt presents with pharyngeal dysphagia that appears to be stable since prior MBS on 04/03/2020.  The primary obstacle to both safety and efficiency of swallow is the presence of large flowing anterior osteophytes along the cervical vertebrae.  These create a narrowing of the pharyngeal space and impact the ability of the epiglottis to invert over the larynx to help protect against aspiration.  Impaired inversion leads to residue that sits on top of the epiglottis (valleculae) and cannot clear, as well as reduced laryngeal closure, allowing intermittent penetration of thin and nectar thick liquids.  Despite this mechanical obstruction, material that enters the larynx is trace - there was never any observed aspiration, just a scant amount of barium that rested on the vocal folds. UES is also tight, with reduced patency, leading to collection  of material in the pyriforms, also difficult to clear.  Esophageal sweep revealed esophageal retention with retrograde flow below pharyngoesophageal segment (PES).  AFter the study, the imaging was reviewed at length with Mr. and Mrs. Helmkamp. We discussed the swallow impairments that are chronic but stable. Mr Ostrovsky has not developed pna - we discussed the risk factors that he can control to reduce susceptibility to pna, primarily oral care and mobility. We discussed crushing pills and/or delivering via G-tube to avoid getting stuck in the valleculae. Finally, I encouraged Mr. Ledyard to continue eating and drinking the foods and liquids that bring him pleasure.  There is no need to modify his diet.  Pt and his wife verbalized understanding.  No further recommendations; no SLP therapy is needed.  Factors that may increase risk of adverse event in presence of aspiration Roderick Civatte & Jessy Morocco 2021): Limited mobility;Frail or deconditioned  Swallow Evaluation Recommendations Recommendations: PO diet PO Diet Recommendation: Regular;Thin liquids (Level 0) Liquid Administration via: Cup;Straw Medication Administration: Crushed with puree Supervision: Patient able to self-feed Oral care recommendations: Oral care QID (4x/day)    Adonia Porada L. Beatris Lincoln, MA CCC/SLP Clinical Specialist - Acute Care SLP Acute Rehabilitation Services Office number [phone]   Myna Asal Laurice 07/18/2023,1:48 PM

## 2023-07-21 ENCOUNTER — Telehealth (HOSPITAL_COMMUNITY): Payer: Self-pay

## 2023-07-21 NOTE — Telephone Encounter (Signed)
 OP MBSS results faxed to [place] at [phone] on 07/21/2023 [facility])

## 2023-07-30 ENCOUNTER — Encounter: Payer: Self-pay | Admitting: Speech Pathology

## 2023-07-30 ENCOUNTER — Other Ambulatory Visit: Payer: Self-pay

## 2023-07-30 ENCOUNTER — Ambulatory Visit: Attending: Internal Medicine | Admitting: Speech Pathology

## 2023-07-30 DIAGNOSIS — R1314 Dysphagia, pharyngoesophageal phase: Secondary | ICD-10-CM | POA: Insufficient documentation

## 2023-07-30 NOTE — Therapy (Signed)
 OUTPATIENT SPEECH LANGUAGE PATHOLOGY SWALLOW EVALUATION   Patient Name: John Walton MRN: [id number] DOB:07/11/1932, 89 y.o., male Today's Date: 07/30/2023  [facility] REFERRING PROVIDER: Wilfred Walton, DA  END OF SESSION:  End of Session - 07/30/23 1441     Visit Number 1    Number of Visits 1    Authorization Type [facility] 15 visits    Activity Tolerance Patient tolerated treatment well             Past Medical History:  Diagnosis Date   Anemia    Carotid artery occlusion    Chicken pox    Chronic kidney disease    Coronary artery disease    Diabetes mellitus without complication (HCC)    Frequent headaches    Glaucoma    Hay fever    History of blood transfusion    HTN (hypertension)    Hyperlipidemia    Hyperlipidemia    Hypothyroidism    Migraines    Peripheral vascular disease (HCC)    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[facility])    Past Surgical History:  Procedure Laterality Date   CHOLECYSTECTOMY, LAPAROSCOPIC     ENDARTERECTOMY Right 12/22/2020   Procedure: RIGHT CAROTID ENDARTERECTOMY;  Surgeon: Dannis Dy, MD;  Location: [facility] OR;  Service: Vascular;  Laterality: Right;   IR GASTROSTOMY TUBE MOD SED  03/29/2020   TONSILLECTOMY  04/01/1940   TOTAL HIP ARTHROPLASTY Left 03/08/2020   Procedure: TOTAL HIP ARTHROPLASTY;  Surgeon: Adah Acron, MD;  Location: [facility];  Service: Orthopedics;  Laterality: Left;   TRANSURETHRAL RESECTION OF PROSTATE N/A 12/14/2020   Procedure: TRANSURETHRAL RESECTION OF THE PROSTATE (TURP);  Surgeon: Trent Frizzle, MD;  Location: [facility] OR[facility];  Service: Urology;  Laterality: N/A;  2 HRS   Patient Active Problem List   Diagnosis Date Noted   AKI (acute kidney injury) (HCC) 06/18/2023   Vomiting and diarrhea 06/17/2023   Central retinal artery occlusion, left eye 07/29/2022   Gastro-esophageal reflux disease without esophagitis 07/29/2022   Nonrheumatic aortic (valve) stenosis 07/29/2022   Stroke-like symptoms    Acute  ischemic stroke (HCC) 12/19/2020   S/P TURP (status post transurethral resection of prostate) 12/19/2020   ABLA (acute blood loss anemia) 12/19/2020   Symptomatic stenosis of right carotid artery 12/19/2020   BPH with obstruction/lower urinary tract symptoms 12/14/2020   Hypocalcemia    Protein-calorie malnutrition, severe 03/29/2020   Hypokalemia 03/29/2020   Hypernatremia 03/29/2020   DM (diabetes mellitus), type 2 (HCC) 03/29/2020   Fever 03/29/2020   Weakness 03/29/2020   Dysphagia 03/26/2020   Bilateral hydronephrosis 03/08/2020   Unilateral primary osteoarthritis, left hip    Femoral neck fracture (HCC) 03/07/2020   CKD (chronic kidney disease), stage IV (HCC) 03/07/2020   Normocytic anemia 03/07/2020   BPH (benign prostatic hyperplasia) 03/07/2020   Abdominal mass 03/07/2020   Scalp hematoma 03/07/2020   Hypertensive urgency 03/07/2020   Pain due to onychomycosis of toenails of both feet 08/13/2019   RBBB 09/02/2014   Screening, ischemic heart disease 07/11/2014   Medicare annual wellness visit, subsequent 07/11/2014   Prostate cancer screening 07/11/2014   Essential hypertension 01/11/2014   Hyperlipidemia 01/11/2014    ONSET DATE: 07/03/2023 Referral date  REFERRING DIAG:  R13.10 (ICD-10-CM) - Dysphagia, unspecified    THERAPY DIAG:  Dysphagia, pharyngoesophageal phase  Rationale for Evaluation and Treatment: Rehabilitation  SUBJECTIVE:   SUBJECTIVE STATEMENT: "He has trouble swallowing - he is clearing his throat more often" Pt accompanied by: significant other  John Walton  PERTINENT HISTORY: 89 y.o. male referred for OP MBS. PMHx of right carotid artery stenosis, hypertension, diabetes, hyperlipidemia, CKD stage IIIb-IV, CVA, BPH status post TURP, hyperthyroidism, PAD, CAD, aortic stenosis, PEG for hydration (per spouse). CT cervical spine 02/12/23: Large flowing anterior osteophytes throughout the cervical spine and upper thoracic spine. Jan 2022 CT head/ceck  demonstrated DISH with large osteophyte at C5-C6 causing mass-effect on the esophagus. Same finding noted on imaging in [place]  in 2011. MBS 04/03/20 - impaired epiglottic closure secondary to DISH and deconditioning, decreased laryngeal elevation, pharyngeal retention - nectar and thin liquids recommended.  PAIN:  Are you having pain? No  FALLS: Has patient fallen in last 6 months?  No  LIVING ENVIRONMENT: Lives with: lives with their family and lives with their spouse Lives in: House/apartment  PLOF:  Level of assistance: Needed assistance with ADLs, Needed assistance with IADLS Employment: Retired  PATIENT GOALS: "There is nothing wrong with me"  OBJECTIVE:  Note: Objective measures were completed at Evaluation unless otherwise noted. OBJECTIVE:     INSTRUMENTAL SWALLOW STUDY FINDINGS (MBSS) 07/18/23 Objective swallow impairments: Pt presents with pharyngeal dysphagia that appears to be stable since prior MBS on 04/03/2020. The primary obstacle to both safety and efficiency of swallow is the presence of large flowing anterior osteophytes along the cervical vertebrae. These create a narrowing of the pharyngeal space and impact the ability of the epiglottis to invert over the larynx to help protect against aspiration. Impaired inversion leads to residue that sits on top of the epiglottis (valleculae) and cannot clear, as well as reduced laryngeal closure, allowing intermittent penetration of thin and nectar thick liquids. Despite this mechanical obstruction, material that enters the larynx is trace - there was never any observed aspiration, just a scant amount of barium that rested on the vocal folds. UES is also tight, with reduced patency, leading to collection of material in the pyriforms, also difficult to clear. Objective recommended compensations: Swallow Evaluation Recommendations Recommendations: PO diet PO Diet Recommendation: Regular;Thin liquids (Level 0) Liquid Administration  via: Cup;Straw Medication Administration: Crushed with puree Supervision: Patient able to self-feed Oral care recommendations: Oral care QID (4x/day)  COGNITION: Overall cognitive status: History of cognitive impairments - at baseline Areas of impairment:  Memory: Impaired: Short term Functional deficits:   SUBJECTIVE DYSPHAGIA REPORTS:  Date of onset: 2021 Reported symptoms: coughing with both solids and liquids, globus sensation, and difficulty chewing foods  Current diet: Dysphagia 3 (mechanical soft) and thin liquids  Co-morbid voice changes: No  FACTORS WHICH MAY INCREASE RISK OF ADVERSE EVENT IN PRESENCE OF ASPIRATION:  General health: frail or deconditioned  Risk factors: reduced cognitive function and GERD or other GI disease    ORAL MOTOR EXAMINATION: Overall status: Impaired:   N/A Comments: edentulous except a few front teeth  CLINICAL SWALLOW ASSESSMENT:   Dentition: poor condition and missing dentition  Vocal quality at baseline: normal Patient directly observed with POs: Yes: dysphagia 3 (soft), thin liquids, and mixed consistentcy  Feeding: able to feed self Liquids provided by: cup [facility] Swallow Protocol:  N/A Oral phase signs and symptoms:  N/A Pharyngeal phase signs and symptoms: immediate throat clear and delayed throat clear  TREATMENT DATE:    4-30/25: Eval only - reviewed general swallow precautions and results of MBSS   PATIENT EDUCATION: Education details: See treatment, general swallow precautions Person educated: Patient and Spouse Education method: Explanation, Verbal cues, and Handouts Education comprehension: verbalized understanding   ASSESSMENT:  CLINICAL IMPRESSION: Patient is a 89 y.o. male who was seen today for dysphagia. MBSS revealed chronic dysphagia due to cervical osteophytes. Wife reports increased in  throat clearing and coughing both with and without PO -Bedside swallow eval consistent with MBSS - frequent throat clearing indicative of pharyngeal residue as noted on MBSS from 2022 and most recent. No f/u with ST at this time. Continue current diet.   OBJECTIVE IMPAIRMENTS: include dysphagia. These impairments are limiting patient from safety when swallowing. Factors affecting potential to achieve goals and functional outcome are previous level of function. Patient will benefit from skilled SLP services to address above impairments and improve overall function.    Karrisa Didio, Dareen Ebbing, CCC-SLP 07/30/2023, 3:44 PM

## 2023-07-30 NOTE — Patient Instructions (Addendum)
   Eliminate distractions during meal time - including too much conversation or visitors  Continue soft moist foods - use gravies, sauces, jellies etc to help the food go down easier  Alternate solids and liquids to wash down any residue in his throat   Wait 30 to 60 minutes prior to laying down after eating  Walking and exercise can help keep lungs clear

## 2023-08-11 ENCOUNTER — Encounter: Attending: Psychology | Admitting: Psychology

## 2023-08-11 ENCOUNTER — Encounter: Payer: Self-pay | Admitting: Psychology

## 2023-08-11 DIAGNOSIS — R413 Other amnesia: Secondary | ICD-10-CM

## 2023-08-11 DIAGNOSIS — F0390 Unspecified dementia without behavioral disturbance: Secondary | ICD-10-CM | POA: Insufficient documentation

## 2023-08-11 NOTE — Progress Notes (Signed)
 NEUROPSYCHOLOGICAL EVALUATION [place]. [facility]  Physical Medicine and Rehabilitation    Patient: John Walton  MRN: [id number] DOB: March 01, 1933  Age: 89 y.o. Sex: male  Race/Ethnicity: White or Caucasian  Years of Education: 14 Handedness: Right  Collateral Information Source: Spouse [name])  Referring Provider: Bonner Gossett, MD  Provider/Clinical Neuropsychologist: Loletta Ripple, PsyD  Date of Service: 08/11/2023 Start Time: 3 PM End Time: 5 PM  Location of Service:  [facility] Physical Medicine & Rehabilitation Department [place]. [facility] [address], [place]. [address] [place], [place] [address] Phone: [phone]  Billing Code/Service:            96116/96121  Individuals Present: Patient was seen, accompanied by his wife with her permission, in-person, by the provider. 1 hour and 15 minutes spent in face-to-face clinical interview and remaining 45 minutes was spent in record review, documentation, and testing protocol construction.    PATIENT CONSENT AND CONFIDENTIALITY The patient's understanding of the reason for referral was intact. Discussed limits of confidentiality including, but not limited to, posting of final evaluation report in the patient's electronic medical record for both the patient and for the referring provider and appropriate medical professionals. Patient was given the opportunity to have their questions answered. The neuropsychological evaluation process was discussed with the patient and they consented to proceed with the evaluation.  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 He was seen by the referring provider with [facility] neurology on 05/14/2023.  Upon interview, the patient and wife indicated hopes to understand what to expect with respect to cognitive functioning in the future.   HISTORY OF PRESENTING CONCERNS: The following was obtained from the patient and his spouse who provided collateral (with patient permission) via clinical interview.  Cognitive Symptom Onset & Course: The patient spouse reported noticeable declines and the patient following a fall in December 2021 .  Additional difficulties were noted after a stroke she reportedly occurred about a year and a half ago.   Current Cognitive Complaints:  Memory: The patient denied any significant concerns with short-term memory.  His wife described greater concerns and reported observing difficulties with remembering conversations and recent events. She reported some forgetfulness with medications. Processing Speed: Denied by patient.  Endorsed by collateral. Attention & Concentration: Denied by patient.  Collateral was uncertain. Language: No difficulties noted by patient or collateral. Visual-Spatial: Uncertain due to significant vision loss. Executive Functioning: Per collateral, the patient has increased difficulty with multitasking.  She also reported a slight increase in irritability/frustration and the patient.  Neither noted any significant changes in problem solving abilities overall although she noted that the patient will not request help when needed at times, although this may be personality related.   Motor/Sensory Complaints:  Sensory changes: Significant hearing and vision loss. Balance/coordination difficulties:Limited mobility. Utilizes wheel chair.  Frequent instances of dizziness/vertigo: Infrequent experiences of dizziness/vertigo. Other motor difficulties: No difficulties with tremors.  Emotional and  Behavioral Functioning:  Depression: Denied any feelings of frequent low mood, anhedonia, hopelessness, suicidal ideation, or other depressive symptomology. Anxiety: The patient denied any significant difficulties with worry/tension/restlessness or other anxiety  related issues. Other: No significant mental health history. One instance of possible hallucination secondary to AMS. No homicidal ideation, paranoia, or symptoms of mania.   Sleep: Reportedly sleeps well at night. Takes long naps during the day. No RBDs.  Appetite: No significant changes or concerns.  Caffeine: None.  Alcohol  Use: Quit in 1983 Tobacco Use: Quit in 1980 Recreational Substance Use: None.    Level of Functional Independence: The patient's independent functioning is impacted by both cognitive, sensory (vision and hearing), and physical health. He requires assistance for most activities of daily living. Most instrumental activities of daily living are managed by his spouse. He does participate to some degree in medication management (taking medication prepared by wife) but will sometimes forget and require reminders. He has had a home health aid for approximately two years. The aid is scheduled for 28 hours weekly, spread over six days.   Medical History/Record Review:  History of traumatic brain injury/concussion:  Fell in late 2024 and may have struck head on ground. Imaging was negative for acute findings.  History of stroke: Yes, 2022.    History of heart attack: No   History of cancer/chemotherapy:No   History of seizure activity: No     Past Medical History:  Diagnosis Date   Anemia    Carotid artery occlusion    Chicken pox    Chronic kidney disease    Coronary artery disease    Diabetes mellitus without complication (HCC)    Frequent headaches    Glaucoma    Hay fever    History of blood transfusion    HTN (hypertension)    Hyperlipidemia    Hyperlipidemia    Hypothyroidism    Migraines    Peripheral  vascular disease (HCC)    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[facility])    Patient Active Problem List   Diagnosis Date Noted   AKI (acute kidney injury) (HCC) 06/18/2023   Vomiting and diarrhea 06/17/2023   Central retinal artery occlusion, left eye 07/29/2022   Gastro-esophageal reflux disease without esophagitis 07/29/2022   Nonrheumatic aortic (valve) stenosis 07/29/2022   Stroke-like symptoms    Acute ischemic stroke (HCC) 12/19/2020   S/P TURP (status post transurethral resection of prostate) 12/19/2020   ABLA (acute blood loss anemia) 12/19/2020   Symptomatic stenosis of right carotid artery 12/19/2020   BPH with obstruction/lower urinary tract symptoms 12/14/2020   Hypocalcemia    Protein-calorie malnutrition, severe 03/29/2020   Hypokalemia 03/29/2020   Hypernatremia 03/29/2020   DM (diabetes mellitus), type 2 (HCC) 03/29/2020   Fever 03/29/2020   Weakness 03/29/2020   Dysphagia 03/26/2020   Bilateral hydronephrosis 03/08/2020   Unilateral primary osteoarthritis, left hip    Femoral neck fracture (HCC) 03/07/2020   CKD (chronic kidney disease), stage IV (HCC) 03/07/2020   Normocytic anemia 03/07/2020   BPH (benign prostatic hyperplasia) 03/07/2020   Abdominal mass 03/07/2020   Scalp hematoma 03/07/2020   Hypertensive urgency 03/07/2020   Pain due to onychomycosis of toenails of both feet 08/13/2019   RBBB 09/02/2014   Screening, ischemic heart disease 07/11/2014   Medicare annual wellness visit, subsequent 07/11/2014   Prostate cancer screening 07/11/2014   Essential hypertension 01/11/2014   Hyperlipidemia 01/11/2014   Imaging/Lab Results: Per records; "06/16/2023 CT HEAD WO CONTRAST ... COMPARISON: Head CT 02/12/2023.  ..Aaron Aas FINDINGS: Brain: There is encephalomalacia due to a chronic right mid superior frontal lobe infarct.   There is mild cerebellar and moderately advanced cerebral atrophy with atrophic ventriculomegaly and moderate to  severe small  vessel disease of the cerebral white matter.   Dystrophic calcifications extend along the falx. No cortical based acute infarct, hemorrhage, mass or midline shift are seen. ... IMPRESSION: 1. No acute intracranial CT findings or interval changes. 2. Chronic right frontal lobe infarct. 3. Atrophy and small vessel disease. 4. Chronic fluid right mastoid tip."  "12/18/2020 MRI HEAD WITHOUT CONTRAST  ... COMPARISON: CT from earlier the same day.  ...   FINDINGS: Brain: Generalized age-related cerebral atrophy. Patchy and confluent T2/FLAIR hyperintensity involving the periventricular deep white matter both cerebral hemispheres as well as the pons, most consistent with chronic small vessel ischemic disease moderate in nature. Encephalomalacia and gliosis involving the anterior right frontal region consistent with a chronic right MCA distribution infarct.  Patchy multifocal areas of restricted diffusion seen involving the cortical gray matter of the right frontal, parietal, and occipital lobes, consistent with acute ischemic infarcts. These measure up to approximately 1 cm in size, and are somewhat watershed in distribution. No associated hemorrhage or mass effect. No other evidence for acute or subacute ischemia. Gray-white matter differentiation otherwise maintained. Few scattered additional chronic micro hemorrhages noted involving the right greater than left cerebral hemispheres, likely small vessel/hypertensive in nature. ... IMPRESSION: 1. Patchy multifocal acute ischemic infarcts involving the cortical gray matter of the right frontal, parietal, and occipital lobes as above. No associated hemorrhage or mass effect. 2. 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 3. Underlying moderate chronic microvascular ischemic disease."   Family Neurologic/Medical Hx: No known family history of dementia.  Family History  Problem Relation Age of Onset    Liver disease Mother    Skin cancer Father    Skin cancer Paternal Grandfather    Diabetes Maternal Grandfather    Medications:  acetaminophen  325 MG tablet aspirin  EC 81 MG tablet atorvastatin  40 MG tablet Calcium -Vitamin D  500-3.125 MG-MCG Tabs carboxymethylcellulose 0.5 % Soln cyanocobalamin  500 MCG tablet ferrous sulfate  325 (65 FE) MG tablet latanoprost  0.005 % ophthalmic solution loperamide  2 MG capsule methimazole  5 MG tablet mupirocin  ointment 2 % NORCO PO Oyster Shell Calcium  w/D 500-5 MG-MCG Tabs pantoprazole  40 MG tablet prednisoLONE  acetate 1 % ophthalmic suspension ProSource Liqd feeding supplement (OSMOLITE 1.5 CAL) Liqd Simbrinza 1-0.2 % Susp Tears Again Oint triamcinolone ointment 0.1 % Varenicline  Tartrate 0.03 MG/ACT Soln Xiidra  5 % Soln  Academic/Vocational History: Highest level of educational attainment: 14 years Employment:Business owner; 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   Psychosocial: Marital Status: Married Children/Grandchildren: 3 children.  Living Situation: With spouse and adult son. Home health aid comes several days during the week.   Mental Status/Behavioral Observations: The patient was seen on an outpatient basis in the [facility] PM&R office for the clinical interview accompanied by his spouse with his permission. Sensorium/Arousal:The patient was alert. Hearing and vision were very poor. He was able to perceive adequately if verbal information was spoken very loudly and if visual information was provided up close and well lit. Orientation: WNL Appearance: Appropriate dress and hygiene. Behavior: Slightly perseverative. Generally pleasant and cooperative.  Speech/Language: Conversational speech was prosodic, fluent, and well-articulated.  When accounting for difficulties with hearing, the patient's receptive language appeared grossly intact. Motor:Utilized  wheelchair for mobility. No tremor was observed.  Social Comportment:Pleasant. Mood:Euthymic Affect:Congruent Thought Process/Content:No indications of psychosis. Ability to Participate in Interview:Readily responded to questions posed during the interview. He  Insight:Limited   SUMMARY / CLINICAL IMPRESSIONS The  patient is an 89 year old man with medical history of CKD, CAD, severe hearing loss, DM2, stroke with residual left-sided weakness, carotid artery stenosis s/p right CEA, blindness in left eye (diabetic retinopathy and glaucoma) who was referred for neuropsychological evaluation by due to concerns for cognitive decline. He was seen by the referring provider with [facility] neurology on 05/14/2023.  Recent imaging is notable for "encephalomalacia due to a chronic right mid superior frontal lobe infarct. There is mild cerebellar and moderately advanced cerebral atrophy with atrophic ventriculomegaly and moderate to severe small vessel disease of the cerebral white matter." The patient reports minimal subjective cognitive difficulties. The patient's spouse reported observing difficulties in short-term memory, processing speed, and multi-tasking. Cognitive changes were reportedly first noticed in 2021 after a fall that resulted in a broken hip. Records indicate the patient had a CVA in 2022. The patient has significant hearing and vision loss and mobility is limited. He subsequently requires assistance with most activities of daily living. There are some indications of difficulties due to cognitive symptoms as well (e.g., forgetting to take medications). The patient's premorbid cognitive abilities are suspected to be above average given his career history. Formal neurocognitive testing is recommended to delineate the patient's cognitive functioning given concerns for cerebrovascular heath and possible impact of cognitive functioning on ADLs.  DISPOSITION / PLAN  The patient has been set up for a formal  neuropsychological assessment to objectively assess her cognitive functioning across domains to establish the patient's cognitive profile. This data, in conjunction with information obtained via clinical interview and medical record review, will help clarify likely etiology and guide treatment recommendations. Once data collection and interpretation have been completed, the findings / diagnosis and recommendations will be reviewed and discussed with the patient during a feedback appointment with the neuropsychologist. Based on the collaborative dialogue with the patient during the feedback, recommendations may be adjusted / tailored as needed. A formal report will be produced and provided to the patient and the referring provider.   Diagnosis: Memory Loss    This report was generated using voice recognition software. While this document has been carefully reviewed, transcription errors may be present. I apologize in advance for any inconvenience. Please contact me if further clarification is needed.             Loletta Ripple, PsyD             Neuropsychologist

## 2023-08-15 ENCOUNTER — Encounter (HOSPITAL_BASED_OUTPATIENT_CLINIC_OR_DEPARTMENT_OTHER)

## 2023-08-15 DIAGNOSIS — R413 Other amnesia: Secondary | ICD-10-CM | POA: Diagnosis not present

## 2023-08-15 NOTE — Progress Notes (Signed)
 Behavioral Observations:  The patient was oriented to self and some aspects of place and time (unable to provide "type" of place or exact day of the month). Hearing and vision were mostly adequate for testing with testing adjusted based on limitations, specifically with eyesight (addressed in relevant section of final report when test performance may have been impacted). He ambulated using a wheelchair. No hand tremor was noted during testing. His speech was prosodic, fluent, and well-articulated. He displayed no clear indications of word-finding difficulties in conversational speech and no paraphasic errors were noted. Receptive language appeared intact. The patient's affect was congruent with mood and his mood was largely neutral to positive. The patient was alert and participated in testing as instructed. He was cooperative throughout the session. His pace was steady. He showed no difficulties with frustration tolerance. The patient's social interactions were unremarkable and consistent with the setting. No frank attentional lapses were appreciated.  Neuropsychology Note  John Walton completed 105 minutes of neuropsychological testing with technician, John Walton, BA, under the supervision of John Hurry, PsyD., Clinical Neuropsychologist. The patient did not appear overtly distressed by the testing session, per behavioral observation or via self-report to the technician. Rest breaks were offered.   Clinical Decision Making: In considering the patient's current level of functioning, level of presumed impairment, nature of symptoms, emotional and behavioral responses during clinical interview, level of literacy, and observed level of motivation/effort, a battery of tests was selected by Dr. Georgeanne Walton during initial consultation on 08/11/2023. This was communicated to the technician. Communication between the neuropsychologist and technician was ongoing throughout the testing session and changes were  made as deemed necessary based on patient performance on testing, technician observations and additional pertinent factors such as those listed above.  Tests Administered: Clock Drawing Test Controlled Oral Word Association Test (FAS & Animals) Neuropsychological Assessment Battery (NAB), select subtest Repeatable Battery for the Assessment of Neuropsychological Status Update (RBANS), Form A Wechsler Adult Intelligence Scale-Fourth Edition (WAIS-IV), select subtests Wechsler Memory Scale-Third Edition (WMS-III), select subtests  Geriatric Depression Scale-Short Form (GDS-SF) Geriatric Anxiety Inventory (GAI)   Results: Note: This summary of test scores accompanies the interpretive report and should not be interpreted by unqualified individuals or in isolation without reference to the report. Test scores are relative to age, gender, and educational history as available and appropriate.   Measurement properties of test scores: IQ, Index, and Standard Scores (SS): Mean = 100; Standard Deviation = 15; Scaled Scores (ss): Mean = 10; Standard Deviation = 3; Z scores (Z): Mean = 0; Standard Deviation = 1; T scores (T); Mean = 50; Standard Deviation = 10  ATTENTION AND WORKING MEMORY    Norm Score Percentile  Range  WAIS-IV          Digit Span  ss = 10 50 %ile Average   DSF  ss = 10 50 %ile Average   Span:    6      DSB  ss = 9 37 %ile Average   Span:    3      DSS  ss = 10 50 %ile Average   Span:    4     RBANS          Digit Span  ss = 9 37 %ile Average   LANGUAGE    Norm Score Percentile  Range  NAB Naming  t = 62 88 %ile High Average  COWAT          FAS  t = 41 18 %ile Low Average   Animals  t = 44 27 %ile Average  DKEFS - Verbal Fluency          Category Switching  ss = 1 0.1 %ile Exceptionally Low   Switching Accuracy  ss = 1 0.1 %ile Exceptionally Low  RBANS           Semantic Fluency  ss = 10 50 %ile Average             EXECUTIVE FUNCTIONING    Norm Score Percentile  Range   WAIS- IV          Vocabulary  ss = 12 75 %ile High Average    Similarities  ss = 11 63 %ile Average   MEMORY    Norm Score Percentile  Range  RBANS          Immediate Memory Index  SS = 85 16 %ile Low Average   Delayed Memory Index  SS = 60 0.4 %ile Exceptionally Low   List Learning  ss = 4 2 %ile Below Average   Story Memory  ss = 11 63 %ile Average   List Recall     10th-16th %ile %ile Low Average   List Recognition     3rd-9th %ile %ile Below Average   Story Recall  ss = 7 16 %ile Low Average   Figure Recall  ss = 1 0.1 %ile Exceptionally Low   VISUAL-SPATIAL    Norm Score Percentile  Range  Clock                   RBANS Visuospatial Index          RBANS Figure Copy  ss = 2 0.4 %ile Exceptionally Low   RBANS Line Orientation      51st-75th %ile   Average   PERSONALITY AND BEHAVIORAL FUNCTIONING      Score/Interpretation  GDS-SF Raw       0  GDS-SF Severity       Minimal.  GAI Raw       0  GAI Severity       Minimal.     Feedback to Patient: John Walton will return on 08/26/2023 for an interactive feedback session with Dr. Georgeanne Walton at which time his test performances, clinical impressions and treatment recommendations will be reviewed in detail. The patient understands he can contact our office should he require our assistance before this time.  105 minutes spent face-to-face with patient administering standardized tests, 30 minutes spent scoring Radiographer, therapeutic). [CPT [id number], [id number]]  Full report to follow.

## 2023-08-21 ENCOUNTER — Encounter: Admitting: Psychology

## 2023-08-21 DIAGNOSIS — F0390 Unspecified dementia without behavioral disturbance: Secondary | ICD-10-CM

## 2023-08-26 ENCOUNTER — Encounter (HOSPITAL_BASED_OUTPATIENT_CLINIC_OR_DEPARTMENT_OTHER): Admitting: Psychology

## 2023-08-26 DIAGNOSIS — F0390 Unspecified dementia without behavioral disturbance: Secondary | ICD-10-CM | POA: Diagnosis not present

## 2023-08-26 DIAGNOSIS — R413 Other amnesia: Secondary | ICD-10-CM | POA: Diagnosis not present

## 2023-09-08 NOTE — Progress Notes (Signed)
   NEUROPSYCHOLOGICAL EVALUATION [place]. [facility]  Physical Medicine and Rehabilitation     Patient: John Walton  MRN: [id number] DOB: [birth year]   Service Provider/Clinical Neuropsychologist: Loletta Ripple, PsyD  Date of Service: 08/26/23 Start Time: 3 PM End Time: 4 PM  Location of Service:  [facility] Physical Medicine & Rehabilitation Department [place]. [facility] [address] [place]. [address] [place], [place] [address] Phone: [phone]   Billing Code/Service: [id number]    Individuals present: Patient, spouse, Provider Loletta Ripple, PsyD)  Provider conducted the 60-minute interactive feedback appointment in-person with the patient and his spouse, with patient permission.  The provider reviewed and discussed the results of neuropsychological evaluation. Follow-up interviewing was conducted as needed to refine interpretation of findings as needed. Review of results included overall findings, diagnosis, and treatment planning/recommendations that were derived from integration of patient data, interpretation of standardized rest results and clinical data, and clinical decision making, which are documented in the patient's electronic medical record with the full report (date listed below). A copy of the full report will also be mailed to the patient.   The patient expressed understanding of the information reviewed. The patient was provided opportunity to ask questions which were then answered by the provider. The provider worked collaboratively to tailor treatment recommendations to the patient when possible. The patient was informed they could reach out to the provider should additional questions related to the evaluation arise.    The final neuropsychological evaluation report, documented in the patient's chart (DATE 08/21/23), was amended to reflect any additional information obtained during the feedback appointment including treatment planning  collaboration.    This report was generated using voice recognition software. While this document has been carefully reviewed, transcription errors may be present. I apologize in advance for any inconvenience. Please contact me if further clarification is needed.             Loletta Ripple, PsyD             Neuropsychologist

## 2023-09-08 NOTE — Progress Notes (Signed)
 NEUROPSYCHOLOGICAL EVALUATION [place]. [facility]  Physical Medicine and Rehabilitation    Patient: John Walton  MRN: [id number] DOB: [birth year]  Age: 89 y.o. Sex: male  Race/Ethnicity: White or Caucasian  Years of Education: 14 Handedness: Right  Collateral Information Source: Spouse [name])  Referring Provider: Bonner Bebeau, MD  Provider/Clinical Neuropsychologist: Loletta Ripple, PsyD  Date of Service: 08/21/23 Start Time: 8 AM End Time: 9 AM  Location of Service:  [facility] Physical Medicine & Rehabilitation Department [place]. [facility] [address] [place] [place], [place] [address] Phone: [phone]  Billing Code/Service: [phone]  Individuals Present: Aleene Hurry, PsyD 1 hour was spent on interpretation of patient data, interpretation of standardized test results and clinical data, clinical decision making, initial treatment planning/recommendations, and report writing. The report will be amended as needed based on any additional information collected during interactive feedback session.    REASON FOR REFERRAL:The patient is an 89 year old man with medical history of CKD, CAD, severe hearing loss, DM2, stroke with residual left-sided weakness, carotid artery stenosis s/p right CEA, blindness in left eye (diabetic retinopathy and glaucoma) who was referred for neuropsychological evaluation by due to concerns for cognitive decline. He was seen by the referring provider with [facility] neurology on 05/14/2023.  Upon interview, the patient and wife indicated hopes to understand what to expect with respect to cognitive functioning in the future.   HISTORY OF PRESENTING CONCERNS: The following was obtained from the patient and his spouse who provided collateral (with patient permission) via clinical interview.  Cognitive Symptom Onset & Course: The patient spouse reported noticeable declines and the patient following a fall in December 2021 .   Additional difficulties were noted after a stroke she reportedly occurred about a year and a half ago.   Current Cognitive Complaints:  Memory: The patient denied any significant concerns with short-term memory.  His wife described greater concerns and reported observing difficulties with remembering conversations and recent events. She reported some forgetfulness with medications. Processing Speed: Denied by patient.  Endorsed by collateral. Attention & Concentration: Denied by patient.  Collateral was uncertain. Language: No difficulties noted by patient or collateral. Visual-Spatial: Uncertain due to significant vision loss. Executive Functioning: Per collateral, the patient has increased difficulty with multitasking.  She also reported a slight increase in irritability/frustration and the patient.  Neither noted any significant changes in problem solving abilities overall although she noted that the patient will not request help when needed at times, although this may be personality related.   Motor/Sensory Complaints:  Sensory changes: Significant hearing and vision loss. Balance/coordination difficulties:Limited mobility. Utilizes wheel chair.  Frequent instances of dizziness/vertigo: Infrequent experiences of dizziness/vertigo. Other motor difficulties: No difficulties with tremors.  Emotional and Behavioral Functioning:  Depression: Denied any feelings of frequent low mood, anhedonia, hopelessness, suicidal ideation, or other depressive symptomology. Anxiety: The patient denied any significant difficulties with worry/tension/restlessness or other anxiety related issues. Other: No significant mental health history. One instance of possible hallucination secondary to AMS. No homicidal ideation, paranoia, or symptoms of mania.   Sleep: Reportedly sleeps well at night. Takes long naps during the day. No RBDs.  Appetite: No significant changes or concerns.  Caffeine: None.  Alcohol  Use:  Quit in 1983 Tobacco Use: Quit in 1980 Recreational Substance Use: None.    Level of Functional Independence: The patient's independent functioning is impacted by both cognitive, sensory (vision and hearing), and physical health. He requires assistance for most activities of daily living.  Most instrumental activities of daily living are managed by his spouse. He does participate to some degree in medication management (taking medication prepared by wife) but will sometimes forget and require reminders. He has had a home health aid for approximately two years. The aid is scheduled for 28 hours weekly, spread over six days.   Medical History/Record Review:  History of traumatic brain injury/concussion:  Fell in late 2024 and may have struck head on ground. Imaging was negative for acute findings.  History of stroke: Yes, 2022.    History of heart attack: No   History of cancer/chemotherapy:No   History of seizure activity: No     Past Medical History:  Diagnosis Date   Anemia    Carotid artery occlusion    Chicken pox    Chronic kidney disease    Coronary artery disease    Diabetes mellitus without complication (HCC)    Frequent headaches    Glaucoma    Hay fever    History of blood transfusion    HTN (hypertension)    Hyperlipidemia    Hyperlipidemia    Hypothyroidism    Migraines    Peripheral vascular disease (HCC)    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[facility])    Patient Active Problem List   Diagnosis Date Noted   AKI (acute kidney injury) (HCC) 06/18/2023   Vomiting and diarrhea 06/17/2023   Central retinal artery occlusion, left eye 07/29/2022   Gastro-esophageal reflux disease without esophagitis 07/29/2022   Nonrheumatic aortic (valve) stenosis 07/29/2022   Stroke-like symptoms    Acute ischemic stroke (HCC) 12/19/2020   S/P TURP (status post transurethral resection of prostate) 12/19/2020   ABLA (acute blood loss anemia) 12/19/2020   Symptomatic stenosis of right  carotid artery 12/19/2020   BPH with obstruction/lower urinary tract symptoms 12/14/2020   Hypocalcemia    Protein-calorie malnutrition, severe 03/29/2020   Hypokalemia 03/29/2020   Hypernatremia 03/29/2020   DM (diabetes mellitus), type 2 (HCC) 03/29/2020   Fever 03/29/2020   Weakness 03/29/2020   Dysphagia 03/26/2020   Bilateral hydronephrosis 03/08/2020   Unilateral primary osteoarthritis, left hip    Femoral neck fracture (HCC) 03/07/2020   CKD (chronic kidney disease), stage IV (HCC) 03/07/2020   Normocytic anemia 03/07/2020   BPH (benign prostatic hyperplasia) 03/07/2020   Abdominal mass 03/07/2020   Scalp hematoma 03/07/2020   Hypertensive urgency 03/07/2020   Pain due to onychomycosis of toenails of both feet 08/13/2019   RBBB 09/02/2014   Screening, ischemic heart disease 07/11/2014   Medicare annual wellness visit, subsequent 07/11/2014   Prostate cancer screening 07/11/2014   Essential hypertension 01/11/2014   Hyperlipidemia 01/11/2014   Imaging/Lab Results: Per records; "06/16/2023 CT HEAD WO CONTRAST ... COMPARISON: Head CT 02/12/2023.  ..Aaron Aas FINDINGS: Brain: There is encephalomalacia due to a chronic right mid superior frontal lobe infarct.   There is mild cerebellar and moderately advanced cerebral atrophy with atrophic ventriculomegaly and moderate to severe small vessel disease of the cerebral white matter.   Dystrophic calcifications extend along the falx. No cortical based acute infarct, hemorrhage, mass or midline shift are seen. ... IMPRESSION: 1. No acute intracranial CT findings or interval changes. 2. Chronic right frontal lobe infarct. 3. Atrophy and small vessel disease. 4. Chronic fluid right mastoid tip."  "12/18/2020 MRI HEAD WITHOUT CONTRAST  ... COMPARISON: CT from earlier the same day.  ...   FINDINGS: Brain: Generalized age-related cerebral atrophy. Patchy and confluent T2/FLAIR hyperintensity involving the periventricular  deep white matter both cerebral  hemispheres as well as the pons, most consistent with chronic small vessel ischemic disease moderate in nature. Encephalomalacia and gliosis involving the anterior right frontal region consistent with a chronic right MCA distribution infarct.  Patchy multifocal areas of restricted diffusion seen involving the cortical gray matter of the right frontal, parietal, and occipital lobes, consistent with acute ischemic infarcts. These measure up to approximately 1 cm in size, and are somewhat watershed in distribution. No associated hemorrhage or mass effect. No other evidence for acute or subacute ischemia. Gray-white matter differentiation otherwise maintained. Few scattered additional chronic micro hemorrhages noted involving the right greater than left cerebral hemispheres, likely small vessel/hypertensive in nature. ... IMPRESSION: 1. Patchy multifocal acute ischemic infarcts involving the cortical gray matter of the right frontal, parietal, and occipital lobes as above. No associated hemorrhage or mass effect. 2. 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 3. Underlying moderate chronic microvascular ischemic disease."   Family Neurologic/Medical Hx: No known family history of dementia.  Family History  Problem Relation Age of Onset   Liver disease Mother    Skin cancer Father    Skin cancer Paternal Grandfather    Diabetes Maternal Grandfather    Medications:  acetaminophen  325 MG tablet aspirin  EC 81 MG tablet atorvastatin  40 MG tablet Calcium -Vitamin D  500-3.125 MG-MCG Tabs carboxymethylcellulose 0.5 % Soln cyanocobalamin  500 MCG tablet ferrous sulfate  325 (65 FE) MG tablet latanoprost  0.005 % ophthalmic solution loperamide  2 MG capsule methimazole  5 MG tablet mupirocin  ointment 2 % NORCO PO Oyster Shell Calcium  w/D 500-5 MG-MCG Tabs pantoprazole  40 MG tablet prednisoLONE  acetate 1 % ophthalmic  suspension ProSource Liqd feeding supplement (OSMOLITE 1.5 CAL) Liqd Simbrinza 1-0.2 % Susp Tears Again Oint triamcinolone ointment 0.1 % Varenicline  Tartrate 0.03 MG/ACT Soln Xiidra  5 % Soln  Academic/Vocational History: Highest level of educational attainment: 14 years Employment:Business owner; 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   Psychosocial: Marital Status: Married Children/Grandchildren: 3 children.  Living Situation: With spouse and adult son. Home health aid comes several days during the week.   NEUROPSYCHODIAGNOSTIC FINDINGS: Behavioral Observations: The patient was oriented to self and some aspects of place and time (unable to provide "type" of place or exact day of the month). Hearing and vision were impaired but compensatory strategies were utilized during administration. No hand tremor was noted during testing. His speech was prosodic, fluent, and well-articulated. He displayed no clear indications of word-finding difficulties in conversational speech and no paraphasic errors were noted. Receptive language appeared intact. The patient's affect was congruent with mood and his mood was largely neutral to positive. The patient was alert and participated in testing as instructed. He was cooperative throughout the session. His pace was steady. He showed no difficulties with frustration tolerance. The patient's social interactions were unremarkable and consistent with the setting. No frank attentional lapses were appreciated.  Tests Administered: Clock Drawing Test Controlled Oral Word Association Test (FAS & Animals) Neuropsychological Assessment Battery (NAB), select subtest Repeatable Battery for the Assessment of Neuropsychological Status Update (RBANS), Form A Wechsler Adult Intelligence Scale-Fourth Edition (WAIS-IV), select subtests Wechsler Memory Scale-Third Edition (WMS-III), select subtests   Geriatric Depression Scale-Short Form (GDS-SF) Geriatric Anxiety Inventory (GAI)  Results: Note:Test scores are relative to age, gender, and educational history as available and appropriate.  Measurement properties of test scores: IQ, Index, and Standard Scores (SS): Mean = 100; Standard Deviation = 15; Scaled Scores (ss): Mean = 10; Standard Deviation =  3; Z scores (Z): Mean = 0; Standard Deviation = 1; T scores (T); Mean = 50; Standard Deviation = 10  Premorbid Estimates    Norm Score Percentile  Range  WAIS- IV          Vocabulary  ss = 12 75 %ile High Average  The patient's premorbid cognitive abilities are estimated to be within the high average range based on his performance on a measure of word knowledge.  ATTENTION AND WORKING MEMORY    Norm Score Percentile  Range  WAIS-IV          Digit Span  ss = 10 50 %ile Average   DSF  ss = 10 50 %ile Average   Span:    6      DSB  ss = 9 37 %ile Average   Span:    3      DSS  ss = 10 50 %ile Average   Span:    4     RBANS          Digit Span  ss = 9 37 %ile Average  The patients auditory-verbal attention was scored in the average range overall.  He scored consistently within the average range on component subtests which included basic span of auditory-verbal attention and auditory working memory.  LANGUAGE    Norm Score Percentile  Range  NAB Naming  t = 62 88 %ile High Average  COWAT          FAS  t = 41 18 %ile Low Average   Animals  t = 44 27 %ile Average  RBANS           Semantic Fluency  ss = 10 50 %ile Average            Confrontation-naming/word retrieval was scored in the high average range by age and education.  Phonemic verbal fluency was low average and semantic verbal fluency was average, but this was not a statistically significant difference.  EXECUTIVE FUNCTIONING    Norm Score Percentile  Range  WAIS- IV           Similarities  ss = 11 63 %ile Average   WMS-III           Mental Control  ss = 9 37 %ile Average    DKEFS - Verbal Fluency          Category Switching  ss = 1 0.1 %ile Exceptionally Low   Switching Accuracy  ss = 1 0.1 %ile Exceptionally Low  The patient's performance on an abstract verbal reasoning task was scored in the average range by age.  His performance on a task involving attention, processing speed, and mental manipulation/set-shifting was average by age.  He had difficulty on a semantic verbal fluency task that involve the set shifting element. The patient's clock drawing showed some difficulties with visual-spatial planning/organization.   MEMORY    Norm Score Percentile  Range  RBANS          Immediate Memory Index  SS = 85 16 %ile Low Average   Delayed Memory Index  SS = 60 0.4 %ile Exceptionally Low   List Learning  ss = 4 2 %ile Below Average   Story Memory  ss = 11 63 %ile Average   List Recall     10th-16th %ile %ile Low Average   List Recognition     3rd-9th %ile %ile Below Average   Story Recall  ss = 7 16 %ile  Low Average   Figure Recall  ss = 1 0.1 %ile Exceptionally Low  The patient's overall immediate memory (immediate recall / learning) was scored in the low average range.  His immediate recall of a word list was scored in the below average range for total words across four learning trials.  He performed better and within the average range for immediate recall of a short story across to presentations.  His delayed free recall of both the word list and the short story was scored in the low average range.  He scored in the below average range with delayed recognition of the word list.  The patient's delayed free recall of a geometric figure was scored in the exceptionally low score range and as he was unable to provide any response.   VISUAL-SPATIAL    Norm Score Percentile  Range  Clock       (See below)            RBANS Visuospatial Index          RBANS Figure Copy  ss = 2 0.4 %ile Exceptionally Low   RBANS Line Orientation      51st-75th %ile   Average  The  patient's figure copy was scored in the exceptionally low score range although visual impairment likely contributed to this to some degree, that being said his basic perception performance on a task involving judgment of line orientation was scored in the average range suggesting some difficulties with visual organization on the figure copy.  His clock drawing showed difficulties primarily with visual planning, although he indicated the incorrect time as well.  PERSONALITY AND BEHAVIORAL FUNCTIONING      Score/Interpretation  GDS-SF Raw       0  GDS-SF Severity       Minimal.  GAI Raw       0  GAI Severity       Minimal.  The patient's endorsements on self-report measures of depression and anxiety showed no clinically significant elevations.  SUMMARY / CLINICAL IMPRESSIONS The patient is an 89 year old man with medical history of CKD, CAD, severe hearing loss, DM2, stroke with residual left-sided weakness, carotid artery stenosis s/p right CEA, blindness in left eye (diabetic retinopathy and glaucoma) who was referred for neuropsychological evaluation by due to concerns for cognitive decline. He was seen by the referring provider with [facility] neurology on 05/14/2023.  Recent imaging is notable for "encephalomalacia due to a chronic right mid superior frontal lobe infarct. There is mild cerebellar and moderately advanced cerebral atrophy with atrophic ventriculomegaly and moderate to severe small vessel disease of the cerebral white matter." The patient reports minimal subjective cognitive difficulties. The patient's spouse reported observing difficulties in short-term memory, processing speed, and multi-tasking. Cognitive changes were reportedly first noticed in 2021 after a fall that resulted in a broken hip. Records indicate the patient had a CVA in 2022. The patient has significant hearing and vision loss and mobility is limited. He subsequently requires assistance with most activities of daily living.  There are some indications of difficulties due to cognitive symptoms as well (e.g., forgetting to take medications). The patient's premorbid cognitive abilities are suspected to be above average given his career history. Formal neurocognitive testing is recommended to delineate the patient's cognitive functioning given concerns for cerebrovascular heath and possible impact of cognitive functioning on ADLs.  The patient's cognitive test profile showed some areas of clear cognitive decline relative to premorbid estimates. Difficulties were primarily seen within memory and  involved reduced learning of new information and problems with retrieval after a delay. He did not show clear or consistent indications of rapid information loss over time. The patient's visual difficulties likely had some impact on his visual-spatial test performances, but his performance on the basic visual-perception task was within normal limits and examination of his figure copy showed errors that could not be reliable accounted for by vision loss. Difficulties with visual planning / organization were also seen in his clock drawing that do not appear attributable to vision loss. Other areas of low performance included phonemic verbal fluency, and an executive functioning measure involving semantic verbal fluency + set-shifting. The patient performed within the average range with auditory-verbal attention + working memory, Engineer, drilling fluency, one task with a set shifting trial (Mental Control), and on an abstract verbal verbal reasoning task. His word-retrieval/confrontation naming performance was high average. The average range performances could potentially reflect declines from his premorbid abilities, which are estimated to be within the high average range based on his vocabulary performance and his professional history. Unfortunately, processing speed could not be assessed reliably as the measures/tests available to the examiner  designed for assessing processing speed all require relatively intact vision. However, qualitative examination of his performance on mental control showed relatively slow speed overall, and slowed processing was something his spouse felt was evident in day to day life.   The patient's cognitive profile shows evidence of cognitive decline with scores well below premorbid estimates, primarily within the domain of memory. There are likely more modest declines globally such that many average range performances reflect a decline from a high-average baseline. It warrants mention that the patient's hearing and vision are impaired. However, testing was adjusted to accommodate this (e.g., verbal information presented loudly) and caution was taken with interpretation when compensation was limited (e.g., visual tasks). The most likely etiology of the patient's cognitive changes involve cerebrovascular disease, which is evident in imaging findings. His cognitive profile showed some alignment with cerebrovascular disease as well. Specifically, his memory profile showed more of a dysexecutive pattern rather than amnestic, some indications of executive dysfunction were seen in his visual planning and semantic verbal fluency, and possible signs of slowed processing. His test results are not suggestive of an Alzheimer's disease process as his memory profile is not amnestic, and language profile was not suggestive of semantic information loss. While there are conditions which can also show primarily executively based deficits (FTD, LBD), the patient's clinical history and behavioral presentation do not align with those conditions.   From a diagnostic perspective, the patient shows evidence of cognitive decline and there are indications that these changes have impacted the patient's independent functioning. A diagnosis of Major Neurocognitive Disorder, mild is appropriate within this context. The patient's independent functioning  could reflect moderate severity, but a mild severity is being indicated at this point given, conservatively. His test profile does not show global impairment by age, and isolating the contributions from cognitive changes on independent functioning is challenged by supports provided due to physical health limitations. At this point, the patient is likely to require ongoing assistance due to difficulties with memory and his physical health. There is significant risk of future cognitive decline given the patient's cerebrovascular health, which emphasizes the importance of ongoing management of the patient's health moving forward.    Diagnosis: Major neurocognitive disorder, mild severity  Recommendations:  Follow-up with referring provider as planned.  The patient is likely to continue to require continued assistance in independent  functioning. It is possible that, over time, there is additional functional decline and greater levels of support may be needed. Planning for current and future needs is therefore encouraged. Doing so now will ensure the patient is able to voice his wishes regarding his future care.  Continued engagement with medical health providers for management of cardiovascular health conditions is highly recommended given close relationship between cardiovascular health (heart health) and cerebrovascular health (brain health). This can reduce risk of cerebrovascular conditions (i.e., stroke) which would likely lead to future cognitive decline.  The patient is encouraged to remain cognitively active and engaged in day-to-day life to the degree that he is safely able to do. Similarly, remaining physically active is also encouraged. Physical and cognitive activity promote cognitive health and can reduce risks of increased rate of cognitive decline. Based on the patient's cognitive profile and reported symptoms, the following may be beneficial compensatory strategies. Information should be  presented clearly and loudly. Due to declines in processing speed, providing information at a reduced pace could be beneficial. Similarly, providing adequate time for responses may be necessary.  The patient is able to learn and retain new information. His difficulties with learning and retrieval suggest that he is likely to benefit from repetition and reminders (cueing).  The Alzheimer's disease organization has two chapter in [place]  and provides answers to many questions for patients and families on their website for any diagnosis of Major Neurocognitive Disorder. They also provide links to caregiver support as well as invite members to local events. If interested, please visit: [email or web address] Please don't hesitate to reach out with any questions or concerns regarding the evaluation and recommendations  This report was generated using voice recognition software. While this document has been carefully reviewed, transcription errors may be present. I apologize in advance for any inconvenience. Please contact me if further clarification is needed.             Loletta Ripple, PsyD             Neuropsychologist

## 2023-12-02 ENCOUNTER — Encounter (HOSPITAL_BASED_OUTPATIENT_CLINIC_OR_DEPARTMENT_OTHER): Payer: Self-pay

## 2023-12-30 ENCOUNTER — Encounter: Payer: Self-pay | Admitting: Internal Medicine

## 2023-12-30 ENCOUNTER — Other Ambulatory Visit (HOSPITAL_COMMUNITY): Payer: Self-pay | Admitting: *Deleted

## 2023-12-30 DIAGNOSIS — R1310 Dysphagia, unspecified: Secondary | ICD-10-CM

## 2024-01-12 ENCOUNTER — Ambulatory Visit (HOSPITAL_COMMUNITY)
Admission: RE | Admit: 2024-01-12 | Discharge: 2024-01-12 | Disposition: A | Discharge status: Home / Self Care | Source: Ambulatory Visit | Attending: Internal Medicine | Admitting: Internal Medicine

## 2024-01-12 ENCOUNTER — Ambulatory Visit (HOSPITAL_COMMUNITY)
Admission: RE | Admit: 2024-01-12 | Discharge: 2024-01-12 | Disposition: A | Source: Ambulatory Visit | Attending: *Deleted | Admitting: *Deleted

## 2024-01-12 DIAGNOSIS — R1313 Dysphagia, pharyngeal phase: Secondary | ICD-10-CM | POA: Insufficient documentation

## 2024-01-12 DIAGNOSIS — R1310 Dysphagia, unspecified: Secondary | ICD-10-CM

## 2024-01-12 NOTE — Evaluation (Signed)
 Modified Barium Swallow Study  Patient Details  Name: John Walton MRN: [id number] Date of Birth: [birth year]  Today's Date: 01/12/2024  Modified Barium Swallow completed.  Full report located under Chart Review in the Imaging Section.  History of Present Illness John Walton is an 89 y.o. male referred for OP MBS.  Pt known to this clinician from 07/18/23 MBS at [facility]. His wife reports more frequent cough/throat-clearing during meals and at night. He has a PMHx of DISH, right carotid artery stenosis, hypertension, diabetes, hyperlipidemia, CKD stage IIIb-IV, CVA, BPH status post TURP, hyperthyroidism, PAD, CAD, aortic stenosis, PEG for hydration. CT cervical spine 02/12/23:  Large flowing anterior osteophytes throughout the cervical spine and upper thoracic spine. Jan 2022 CT head/ceck demonstrated DISH with large osteophyte at C5-C6 causing mass-effect on the esophagus.  Same finding noted on imaging in [place]  in 2011. MBS 04/03/20 - impaired epiglottic closure secondary to DISH and deconditioning, decreased laryngeal elevation, pharyngeal retention - nectar and thin liquids recommended. MBS 07/18/23 - stable swallowing since 2022 study.   Clinical Impression Pt presents with pharyngeal dysphagia that is relatively consistent with prior studies. The primary obstacle to both safety and efficiency of swallowing remains DISH, large flowing anterior osteophytes along the cervical vertebrae.  These create a narrowing of the pharyngeal space and impact the ability of the epiglottis to invert over the larynx to help protect against aspiration.  Impaired inversion leads to residue that sits on top of the epiglottis (valleculae) and cannot clear, as well as reduced laryngeal closure, allowing intermittent penetration of thin liquids.  As in most recent study, the quantity of material that entered the larynx was trace.  Over time, penetrated thin liquid eventually reached the vocal folds, eliciting a  spontaneous throat-clearing. At one point, a speck of barium reached below the vocal folds (aspiration).  Doubt that the pt is aspirating large volumes. His wife reports no pna since last seen by SLP in April.  Study was reviewed with pt, his wife and son - we reviewed video and discussed the benefit of continuing to eat and drink the foods/liquids that John Walton enjoys.  Given that there have been no health consequences of his dysphagia, there is no reason to modify diet.  We discussed crushing pills and/or delivering via G-tube to avoid getting stuck in the valleculae. Pt/family agree with plan. No further recommendations; no SLP therapy is needed.    Factors that may increase risk of adverse event in presence of aspiration John Walton & John Walton 2021): Frail or deconditioned;Limited mobility  Swallow Evaluation Recommendations Recommendations: PO diet PO Diet Recommendation: Regular;Thin liquids (Level 0) Liquid Administration via: Cup;Straw Medication Administration: Via alternative means Supervision: Patient able to self-feed     John Walton L. [name], MA CCC/SLP Clinical Specialist - Acute Care SLP Acute Rehabilitation Services Office number [phone]  John Walton 01/12/2024,4:13 PM

## 2024-01-17 ENCOUNTER — Inpatient Hospital Stay (HOSPITAL_COMMUNITY)
Admission: EM | Admit: 2024-01-17 | Discharge: 2024-01-20 | DRG: 178 | Disposition: A | Discharge status: Home Health | Attending: Family Medicine | Admitting: Family Medicine

## 2024-01-17 ENCOUNTER — Other Ambulatory Visit: Payer: Self-pay

## 2024-01-17 ENCOUNTER — Emergency Department (HOSPITAL_COMMUNITY)

## 2024-01-17 DIAGNOSIS — I2510 Atherosclerotic heart disease of native coronary artery without angina pectoris: Secondary | ICD-10-CM | POA: Diagnosis present

## 2024-01-17 DIAGNOSIS — E1122 Type 2 diabetes mellitus with diabetic chronic kidney disease: Secondary | ICD-10-CM | POA: Diagnosis present

## 2024-01-17 DIAGNOSIS — Z1152 Encounter for screening for COVID-19: Secondary | ICD-10-CM

## 2024-01-17 DIAGNOSIS — F0150 Vascular dementia without behavioral disturbance: Secondary | ICD-10-CM | POA: Diagnosis present

## 2024-01-17 DIAGNOSIS — J189 Pneumonia, unspecified organism: Secondary | ICD-10-CM

## 2024-01-17 DIAGNOSIS — Z96642 Presence of left artificial hip joint: Secondary | ICD-10-CM | POA: Diagnosis present

## 2024-01-17 DIAGNOSIS — Z8673 Personal history of transient ischemic attack (TIA), and cerebral infarction without residual deficits: Secondary | ICD-10-CM

## 2024-01-17 DIAGNOSIS — Z808 Family history of malignant neoplasm of other organs or systems: Secondary | ICD-10-CM

## 2024-01-17 DIAGNOSIS — Z9079 Acquired absence of other genital organ(s): Secondary | ICD-10-CM

## 2024-01-17 DIAGNOSIS — Z87891 Personal history of nicotine dependence: Secondary | ICD-10-CM

## 2024-01-17 DIAGNOSIS — E0590 Thyrotoxicosis, unspecified without thyrotoxic crisis or storm: Secondary | ICD-10-CM | POA: Diagnosis present

## 2024-01-17 DIAGNOSIS — E871 Hypo-osmolality and hyponatremia: Secondary | ICD-10-CM | POA: Diagnosis present

## 2024-01-17 DIAGNOSIS — R000 Tachycardia, unspecified: Secondary | ICD-10-CM | POA: Diagnosis present

## 2024-01-17 DIAGNOSIS — N1330 Unspecified hydronephrosis: Secondary | ICD-10-CM | POA: Diagnosis present

## 2024-01-17 DIAGNOSIS — Z7982 Long term (current) use of aspirin: Secondary | ICD-10-CM

## 2024-01-17 DIAGNOSIS — E785 Hyperlipidemia, unspecified: Secondary | ICD-10-CM | POA: Diagnosis present

## 2024-01-17 DIAGNOSIS — R1313 Dysphagia, pharyngeal phase: Secondary | ICD-10-CM | POA: Diagnosis present

## 2024-01-17 DIAGNOSIS — H109 Unspecified conjunctivitis: Secondary | ICD-10-CM | POA: Diagnosis present

## 2024-01-17 DIAGNOSIS — I129 Hypertensive chronic kidney disease with stage 1 through stage 4 chronic kidney disease, or unspecified chronic kidney disease: Secondary | ICD-10-CM | POA: Diagnosis present

## 2024-01-17 DIAGNOSIS — I350 Nonrheumatic aortic (valve) stenosis: Secondary | ICD-10-CM | POA: Diagnosis present

## 2024-01-17 DIAGNOSIS — E039 Hypothyroidism, unspecified: Secondary | ICD-10-CM | POA: Diagnosis present

## 2024-01-17 DIAGNOSIS — G43909 Migraine, unspecified, not intractable, without status migrainosus: Secondary | ICD-10-CM | POA: Diagnosis present

## 2024-01-17 DIAGNOSIS — Z79899 Other long term (current) drug therapy: Secondary | ICD-10-CM

## 2024-01-17 DIAGNOSIS — E440 Moderate protein-calorie malnutrition: Secondary | ICD-10-CM | POA: Diagnosis present

## 2024-01-17 DIAGNOSIS — Z887 Allergy status to serum and vaccine status: Secondary | ICD-10-CM

## 2024-01-17 DIAGNOSIS — Z931 Gastrostomy status: Secondary | ICD-10-CM

## 2024-01-17 DIAGNOSIS — J690 Pneumonitis due to inhalation of food and vomit: Secondary | ICD-10-CM | POA: Diagnosis not present

## 2024-01-17 DIAGNOSIS — Z833 Family history of diabetes mellitus: Secondary | ICD-10-CM

## 2024-01-17 DIAGNOSIS — I2489 Other forms of acute ischemic heart disease: Secondary | ICD-10-CM | POA: Diagnosis present

## 2024-01-17 DIAGNOSIS — E1151 Type 2 diabetes mellitus with diabetic peripheral angiopathy without gangrene: Secondary | ICD-10-CM | POA: Diagnosis present

## 2024-01-17 DIAGNOSIS — Z888 Allergy status to other drugs, medicaments and biological substances status: Secondary | ICD-10-CM

## 2024-01-17 DIAGNOSIS — R55 Syncope and collapse: Principal | ICD-10-CM | POA: Diagnosis present

## 2024-01-17 DIAGNOSIS — N184 Chronic kidney disease, stage 4 (severe): Secondary | ICD-10-CM | POA: Diagnosis present

## 2024-01-17 DIAGNOSIS — Z515 Encounter for palliative care: Secondary | ICD-10-CM

## 2024-01-17 DIAGNOSIS — R1110 Vomiting, unspecified: Secondary | ICD-10-CM | POA: Diagnosis present

## 2024-01-17 DIAGNOSIS — F1021 Alcohol dependence, in remission: Secondary | ICD-10-CM | POA: Diagnosis present

## 2024-01-17 LAB — COMPREHENSIVE METABOLIC PANEL WITH GFR
ALT: 14 U/L (ref 0–44)
AST: 18 U/L (ref 15–41)
Albumin: 3 g/dL — ABNORMAL LOW (ref 3.5–5.0)
Alkaline Phosphatase: 73 U/L (ref 38–126)
Anion gap: 11 (ref 5–15)
BUN: 46 mg/dL — ABNORMAL HIGH (ref 8–23)
CO2: 19 mmol/L — ABNORMAL LOW (ref 22–32)
Calcium: 8.4 mg/dL — ABNORMAL LOW (ref 8.9–10.3)
Chloride: 101 mmol/L (ref 98–111)
Creatinine, Ser: 2.32 mg/dL — ABNORMAL HIGH (ref 0.61–1.24)
GFR, Estimated: 26 mL/min — ABNORMAL LOW (ref >60)
Glucose, Bld: 214 mg/dL — ABNORMAL HIGH (ref 70–99)
Potassium: 4.8 mmol/L (ref 3.5–5.1)
Sodium: 131 mmol/L — ABNORMAL LOW (ref 135–145)
Total Bilirubin: 0.4 mg/dL (ref 0.0–1.2)
Total Protein: 6.4 g/dL — ABNORMAL LOW (ref 6.5–8.1)

## 2024-01-17 LAB — CBC
HCT: 29.8 % — ABNORMAL LOW (ref 39.0–52.0)
Hemoglobin: 9.8 g/dL — ABNORMAL LOW (ref 13.0–17.0)
MCH: 30.2 pg (ref 26.0–34.0)
MCHC: 32.9 g/dL (ref 30.0–36.0)
MCV: 91.7 fL (ref 80.0–100.0)
Platelets: 246 K/uL (ref 150–400)
RBC: 3.25 MIL/uL — ABNORMAL LOW (ref 4.22–5.81)
RDW: 12.5 % (ref 11.5–15.5)
WBC: 9.5 K/uL (ref 4.0–10.5)
nRBC: 0 % (ref 0.0–0.2)

## 2024-01-17 LAB — TROPONIN I (HIGH SENSITIVITY)
Troponin I (High Sensitivity): 48 ng/L — ABNORMAL HIGH (ref <18)
Troponin I (High Sensitivity): 37 ng/L — ABNORMAL HIGH (ref <18)

## 2024-01-17 LAB — URINALYSIS, ROUTINE W REFLEX MICROSCOPIC
Bilirubin Urine: NEGATIVE
Glucose, UA: 50 mg/dL — AB
Hgb urine dipstick: NEGATIVE
Ketones, ur: NEGATIVE mg/dL
Leukocytes,Ua: NEGATIVE
Nitrite: NEGATIVE
Protein, ur: 30 mg/dL — AB
Specific Gravity, Urine: 1.01 (ref 1.005–1.030)
pH: 5 (ref 5.0–8.0)

## 2024-01-17 MED ORDER — SODIUM CHLORIDE 0.9 % IV SOLN
1.0000 g | Freq: Once | INTRAVENOUS | Status: AC
  Administered 2024-01-17: 1 g via INTRAVENOUS
  Filled 2024-01-17: qty 10

## 2024-01-17 MED ORDER — AZITHROMYCIN 500 MG IV SOLR
500.0000 mg | Freq: Once | INTRAVENOUS | Status: AC
  Administered 2024-01-17: 500 mg via INTRAVENOUS
  Filled 2024-01-17: qty 5

## 2024-01-17 NOTE — ED Triage Notes (Signed)
 Pt BIB [facility] coming from home. Called out for [facility] with coughing and vomiting. Diarrhea yesterday. Syncopal episode with [employer] on a chair. No fall. GI tube in place on arrival. No complaints on arrival.  EMS VS:  500 NS  76HR 135/53 98% 2L CBG 242

## 2024-01-17 NOTE — ED Notes (Signed)
 CCMD has been notified of cardiac monitoring.

## 2024-01-17 NOTE — ED Provider Notes (Cosign Needed)
 [place] [facility] Provider Note   CSN: [id number] Arrival date & time: 01/17/24  1858     Patient presents with: Near Syncope   John Walton is a 89 y.o. male with past medical history of HTN, HLD, RBBB, CKD stage IV, BPH (S/P TURP), dysphagia, GERD, T2DM, vascular dementia presents to the Emergency Department via EMS for evaluation of 2 syncopal episodes today.  Family at bedside reports that he was with home health nurse when she witnessed 1st syncopal episode.  He was gray and diaphoretic.  Following this, they called EMS who reported hypotension of 80 mmHg systolic.  While in EMS care, patient had an additional 30 second syncopal episode. No head injury, fevers. Has PEG tube and has feeding supplements 2x/day.      Near Syncope       Prior to Admission medications   Medication Sig Start Date End Date Taking? Authorizing Provider  acetaminophen  (TYLENOL ) 325 MG tablet Take 650 mg by mouth every 6 (six) hours as needed for pain.    [provider]  aspirin  EC 81 MG tablet Take 1 tablet (81 mg total) by mouth daily. 12/26/20   Singh, Prashant K, MD  atorvastatin  (LIPITOR) 40 MG tablet Take 40 mg by mouth at bedtime.    [provider]  Calcium  Carb-Cholecalciferol  (OYSTER SHELL CALCIUM  W/D) 500-5 MG-MCG TABS Take 1 tablet by mouth in the morning and at bedtime. 10/31/20   [provider]  Calcium -Vitamin D  500-3.125 MG-MCG TABS Take 1 tablet by mouth in the morning and at bedtime.    [provider]  carboxymethylcellulose (REFRESH PLUS) 0.5 % SOLN Place 1 drop into both eyes every hour while awake.    [provider]  ferrous sulfate  325 (65 FE) MG tablet Take 1 tablet (325 mg total) by mouth daily with breakfast. 12/26/20   Singh, Prashant K, MD  HYDROcodone -Acetaminophen  (NORCO PO) Take 1 tablet by mouth as needed (pain).    [provider]  latanoprost  (XALATAN ) 0.005 % ophthalmic  solution Place 1 drop into both eyes at bedtime.  03/13/19   [provider]  Lifitegrast  (XIIDRA ) 5 % SOLN Place 1 drop into both eyes in the morning and at bedtime.    [provider]  loperamide  (IMODIUM ) 2 MG capsule Take 2 mg by mouth as needed for diarrhea or loose stools.    [provider]  methimazole  (TAPAZOLE ) 5 MG tablet Take 5 mg by mouth daily. 04/07/23   [provider]  mupirocin  ointment (BACTROBAN ) 2 % Place 1 Application into the nose 2 (two) times daily. 06/20/23   Will Almarie MATSU, MD  Nutritional Supplements (FEEDING SUPPLEMENT, OSMOLITE 1.5 CAL,) LIQD Place 237 mLs into feeding tube as needed (when not getting enough nutrition). 04/03/20   [provider]  Nutritional Supplements (PROSOURCE) LIQD Give 45 mLs by tube in the morning and at bedtime.    [provider]  pantoprazole  (PROTONIX ) 40 MG tablet Take 40 mg by mouth as needed for heartburn.    [provider]  prednisoLONE  acetate (PRED FORTE ) 1 % ophthalmic suspension Place 1 drop into the left eye daily. 05/13/23   [provider]  SIMBRINZA 1-0.2 % SUSP Place 1 drop into both eyes in the morning and at bedtime.  05/16/19   [provider]  triamcinolone ointment (KENALOG) 0.1 % Apply 1 Application topically daily as needed (for gastrostomy site). 03/10/23   [provider]  Varenicline   Tartrate 0.03 MG/ACT SOLN Place 1 spray into both nostrils 2 (two) times daily. 06/16/23   [provider]  vitamin B-12 (CYANOCOBALAMIN ) 500 MCG tablet Take 1 tablet by mouth 2 (two) times daily. 01/03/21   [provider]  Water  For Irrigation, Sterile (FREE WATER ) SOLN Place 120 mLs into feeding tube 5 (five) times daily. 04/03/20   Cheryle Page, MD  White Petrolatum-Mineral Oil (TEARS AGAIN) OINT Place 1 application  into both eyes in the morning and at bedtime. Refresh PM    [provider]    Allergies: Other, Influenza  vaccines, and Robitussin cold cough+ chest [dextromethorphan -guaifenesin ]    Review of Systems  Cardiovascular:  Positive for near-syncope.    Updated Vital Signs BP (!) 166/60 (BP Location: Left Arm)   Pulse 77   Temp (!) 97.4 F (36.3 C) (Oral)   Resp (!) 24   Ht 5' 6 (1.676 m)   Wt 70 kg   SpO2 100%   BMI 24.91 kg/m   Physical Exam Vitals and nursing note reviewed.  Constitutional:      General: He is not in acute distress.    Appearance: Normal appearance.  HENT:     Head: Normocephalic and atraumatic.  Eyes:     Conjunctiva/sclera: Conjunctivae normal.  Cardiovascular:     Rate and Rhythm: Normal rate.     Pulses: Normal pulses.  Pulmonary:     Effort: Pulmonary effort is normal. No respiratory distress.     Breath sounds: Examination of the left-lower field reveals decreased breath sounds. Decreased breath sounds present.  Abdominal:     General: Bowel sounds are normal. There is no distension.     Palpations: Abdomen is soft.     Tenderness: There is no abdominal tenderness. There is no guarding or rebound.     Comments: PEG tube in place with surrounding erythema, drainage, swelling  Musculoskeletal:     Cervical back: Normal range of motion and neck supple. No rigidity or tenderness.     Right lower leg: No edema.     Left lower leg: No edema.  Skin:    Coloration: Skin is not jaundiced or pale.     Comments: No ecchymosis to chest, abdomen, back  Neurological:     Mental Status: He is alert. Mental status is at baseline.     Comments: A&Ox2 with confusion of current year which is his baseline.  Motor 5/5 and sensation 2/2 BUE and BLE.  Following commands appropriately.  No slurred speech or aphasia.     (all labs ordered are listed, but only abnormal results are displayed) Labs Reviewed  COMPREHENSIVE METABOLIC PANEL WITH GFR - Abnormal; Notable for the following components:      Result Value   Sodium 131 (*)    CO2 19 (*)    Glucose, Bld 214 (*)     BUN 46 (*)    Creatinine, Ser 2.32 (*)    Calcium  8.4 (*)    Total Protein 6.4 (*)    Albumin  3.0 (*)    GFR, Estimated 26 (*)    All other components within normal limits  CBC - Abnormal; Notable for the following components:   RBC 3.25 (*)    Hemoglobin 9.8 (*)    HCT 29.8 (*)    All other components within normal limits  TROPONIN I (HIGH SENSITIVITY) - Abnormal; Notable for the following components:   Troponin I (High Sensitivity) 48 (*)    All other components within  normal limits  URINALYSIS, ROUTINE W REFLEX MICROSCOPIC  CBG MONITORING, ED  TROPONIN I (HIGH SENSITIVITY)    EKG: EKG Interpretation Date/Time:  Saturday January 17 2024 19:16:18 EDT Ventricular Rate:  78 PR Interval:  216 QRS Duration:  154 QT Interval:  437 QTC Calculation: 498 R Axis:   -79  Text Interpretation: Sinus rhythm Borderline prolonged PR interval Confirmed by Cottie Cough [phone]) on 01/17/2024 8:34:36 PM  Radiology: CT Head Wo Contrast Result Date: 01/17/2024 EXAM: CT HEAD WITHOUT CONTRAST 01/17/2024 08:46:41 PM TECHNIQUE: CT of the head was performed without the administration of intravenous contrast. Automated exposure control, iterative reconstruction, and/or weight based adjustment of the mA/kV was utilized to reduce the radiation dose to as low as reasonably achievable. COMPARISON: Comparison is made with prior CT from 06/16/2023. CLINICAL HISTORY: loc. Syncopal episode loc. Syncopal episode FINDINGS: BRAIN AND VENTRICLES: No acute hemorrhage. No evidence of acute infarct. Generalized age-related cerebral atrophy with moderate chronic microvascular ischemic disease. Chronic right frontal and right occipital infarcts noted. Remote lacunar infarct at the right basal ganglia. No hydrocephalus. No extra-axial collection. No mass effect or midline shift. ORBITS: No acute abnormality. SINUSES: No acute abnormality. SOFT TISSUES AND SKULL: No acute soft tissue abnormality. No skull fracture.  Calcified atherosclerosis present about the skull base. IMPRESSION: 1. No acute intracranial abnormality. 2. Generalized age-related cerebral atrophy with moderate chronic microvascular ischemic disease. 3. Chronic right frontal and right occipital infarcts, and a remote lacunar infarct at the right basal ganglia. Electronically signed by: Morene Hoard MD 01/17/2024 08:58 PM EDT RP Workstation: [id number]   DG Chest 2 View Result Date: 01/17/2024 EXAM: 2 VIEW(S) XRAY OF THE CHEST 06/18/2023 COMPARISON: None available. CLINICAL HISTORY: aspiration. SHOB with coughing and vomiting. Diarrhea yesterday. Syncopal episode with [employer] on a chair FINDINGS: LUNGS AND PLEURA: There is low inspiration. Asymmetric patchy consolidation intermixed with linear atelectasis is seen in the left lower lobe base probably a combination of atelectasis, pneumonia, and/or aspiration. There is also a small left pleural effusion. The remaining lungs are clear. No pulmonary edema. No pneumothorax. HEART AND MEDIASTINUM: The cardiomediastinal silhouette and vascular pattern are normal. There is calcification of the transverse aorta. BONES AND SOFT TISSUES: There is bilateral acromiohumeral abutment consistent with chronic rotator cuff arthropathy. No new osseous findings. Extensive thoracic spine bridging enthesopathy. No acute osseous abnormality. IMPRESSION: 1. Asymmetric patchy consolidation and linear atelectasis in the left lower lobe base, possibly representing a combination of atelectasis, pneumonia, and/or aspiration. 2. Small left pleural effusion. 3. Follow-up study recommended to ensure clearing. Electronically signed by: Francis Quam MD 01/17/2024 08:28 PM EDT RP Workstation: [id number]     Medications Ordered in the ED  azithromycin  (ZITHROMAX ) 500 mg in sodium chloride  0.9 % 250 mL IVPB (has no administration in time range)  cefTRIAXone  (ROCEPHIN ) 1 g in sodium chloride  0.9 % 100 mL IVPB (1 g Intravenous New  Bag/Given 01/17/24 2139)    Clinical Course as of 01/17/24 2221  Sat Jan 17, 2024  2033 This is an 89 year old male presenting from home with an episode of near syncope.  Patient not lose consciousness but family members report that became weak and fell up stairs.  Patient appears confused but is very hard of hearing, cannot provide further information.  He reports nausea.  Patient has pending labs, CT head, x-ray of the chest.  He does have a feeding tube and has a history of dehydration, for which he gets free water  flushes at home. [MT]  2034  X-ray shows a possible left-sided infiltrate or pneumonia, possible aspiration [MT]  2034 EKG per interpretation shows sinus rhythm, no acute ischemic changes no prior tracings.  Patient is pending UA, CBC, CT head.  Supplement history provided by family members at bedside [MT]  2120 Likely aspiration pneumonia, will start on antibiotics and anticipate medical admission given advanced age and concern for syncope.  Initial troponin is mildly elevated with repeat troponin pending.  CT head does not show acute stroke [MT]    Clinical Course User Index [MT] Trifan, Donnice PARAS, MD                                 Medical Decision Making Amount and/or Complexity of Data Reviewed Labs: ordered. Radiology: ordered.  Risk Decision regarding hospitalization.   Patient presents to the ED for concern of syncope, this involves an extensive number of treatment options, and is a complaint that carries with it a high risk of complications and morbidity.  The differential diagnosis includes cardiac syncope, ACS, ICH, dehydration, dehydration, electrolyte abnormality, CVA/TIA, UTI, pneumonia, vasovagal, hypoglycemia, symptomatic anemia.  Not an exhaustive list   Co morbidities that complicate the patient evaluation  See HPI   Additional history obtained:  Additional history obtained from EMS, Family, and Nursing   External records from outside source  obtained and reviewed including triage RN note, family at bedside   Lab Tests:  I Ordered, and personally interpreted labs.  The pertinent results include:   NA 131 CBG 214 Creatinine 2.32 BUN 46 Protein 6.4 albumin  3 Troponin 48. 2nd troponin 37 and downtrending Hgb 9.8 UA without infection nor Hgb   Imaging Studies ordered:  I ordered imaging studies including CT head, CXR  I independently visualized and interpreted imaging which showed  CT:  No acute intracranial abnormality. Generalized age-related cerebral atrophy with moderate chronic microvascular ischemic disease. Chronic right frontal and right occipital infarcts, and a remote lacunar infarct at the right basal ganglia CXR: Asymmetric patchy consolidation and linear atelectasis in the left lower lobe base, possibly representing a combination of atelectasis, pneumonia, and/or aspiration. Small left pleural effusion. I agree with the radiologist interpretation   Cardiac Monitoring:  The patient was maintained on a cardiac monitor.  I personally viewed and interpreted the cardiac monitored which showed an underlying rhythm of: NSR with no STE nor T wave abnormalities   Medicines ordered and prescription drug management:  I ordered medication including Rocephin , azithromycin  for PNA  Reevaluation of the patient after these medicines showed that the patient stayed the same I have reviewed the patients home medicines and have made adjustments as needed    Consultations Obtained:  I requested consultation with hospitalist Dr. Alfornia,  and discussed lab and imaging findings as well as pertinent plan - accepts patient for admission   Problem List / ED Course:  Syncope Vital signs hemodynamically stable with no tachycardia nor hypotension No recollection of syncope. No complaints nor prodrome prior to syncope No head injury. No signs of basilar skull fracture.  Did not fall from chair and did not sustain injury as he  was called by home health nurse during for syncopal episode and was in EMS care during second syncopal episode.  No signs of injury on physical exam.  Moving all extremities without difficulty.  No ecchymosis to chest, abdomen, back Does have PEG tube which appears in proper placement.  No drainage from PEG tube site.  No surrounding erythema, warmth, swelling, fluctuance. No abd tenderness. Low suspicion for infection.  Has been using PEG tube without complication at home A&Ox2 per his baseline Neurologically intact.  Following commands appropriately.  No focal deficits.  Low suspicion for CVA/TIA CT head wo ICH Lab work notable for mildly elevated troponin of 48.  Second troponin downtrending.  No history of elevated troponin however does have CKD stage IV which could be contributing to elevated troponin UA wo infection Currently patient has no complaints. No pain, cp, shob   Aspiration PNA CXR notable for asymmetric consolidation in LL lung base with small left pleural effusion As patient has a history of dysphagia, GERD requiring PEG tube placement, patient is likely to be more risk for aspiration pneumonia.  Fortunately, no fever or tachycardia.  No leukocytosis. Did administer azithromycin , Rocephin  for pneumonia No hypoxia.  Maintaining O2 saturation without supplementation   Reevaluation:  After the interventions noted above, I reevaluated the patient and found that they have :improved    Dispostion:  After consideration of the diagnostic results and the patients response to treatment, I feel that the patent would benefit from admission for syncope and PNA.   Discussed ED workup, disposition with patient and patient's family at bedside who expressed understanding agree with plan  Dr. Cottie individually assessed pt, reviewed ED workup, and agrees with plan  Final diagnoses:  Syncope, unspecified syncope type  Vascular dementia, unspecified dementia severity, unspecified  whether behavioral, psychotic, or mood disturbance or anxiety (HCC)  Pneumonia of left lower lobe due to infectious organism    ED Discharge Orders     None        Minnie Tinnie BRAVO, PA 01/17/24 2351    Minnie Tinnie BRAVO, PA 01/18/24 0009    Cottie Donnice PARAS, MD 01/18/24 1318

## 2024-01-17 NOTE — ED Notes (Signed)
 Patient transported to CT

## 2024-01-18 DIAGNOSIS — R55 Syncope and collapse: Secondary | ICD-10-CM | POA: Diagnosis not present

## 2024-01-18 LAB — TSH: TSH: 0.286 u[IU]/mL — ABNORMAL LOW (ref 0.350–4.500)

## 2024-01-18 LAB — C-REACTIVE PROTEIN: CRP: 4.1 mg/dL — ABNORMAL HIGH (ref <1.0)

## 2024-01-18 LAB — CBG MONITORING, ED
Glucose-Capillary: 190 mg/dL — ABNORMAL HIGH (ref 70–99)
Glucose-Capillary: 141 mg/dL — ABNORMAL HIGH (ref 70–99)

## 2024-01-18 LAB — T4, FREE: Free T4: 0.95 ng/dL (ref 0.61–1.12)

## 2024-01-18 LAB — PROCALCITONIN: Procalcitonin: 0.14 ng/mL

## 2024-01-18 MED ORDER — ONDANSETRON HCL 4 MG PO TABS: 4.0000 mg | ORAL_TABLET | Freq: Four times a day (QID) | ORAL | Status: DC | PRN

## 2024-01-18 MED ORDER — ONDANSETRON HCL 4 MG/2ML IJ SOLN: 4.0000 mg | Freq: Four times a day (QID) | INTRAMUSCULAR | Status: DC | PRN

## 2024-01-18 MED ORDER — PROSOURCE TF20 ENFIT COMPATIBL EN LIQD
60.0000 mL | Freq: Two times a day (BID) | ENTERAL | Status: DC
  Administered 2024-01-18 – 2024-01-20 (×4): 60 mL
  Filled 2024-01-18 (×6): qty 60

## 2024-01-18 MED ORDER — METHIMAZOLE 5 MG PO TABS
5.0000 mg | ORAL_TABLET | Freq: Every day | ORAL | Status: DC
  Filled 2024-01-18: qty 1

## 2024-01-18 MED ORDER — INSULIN ASPART 100 UNIT/ML IJ SOLN
0.0000 [IU] | Freq: Three times a day (TID) | INTRAMUSCULAR | Status: DC
  Administered 2024-01-20: 2 [IU] via SUBCUTANEOUS
  Administered 2024-01-20: 3 [IU] via SUBCUTANEOUS
  Administered 2024-01-20: 1 [IU] via SUBCUTANEOUS

## 2024-01-18 MED ORDER — ACETAMINOPHEN 650 MG RE SUPP: 650.0000 mg | Freq: Four times a day (QID) | RECTAL | Status: DC | PRN

## 2024-01-18 MED ORDER — ATORVASTATIN CALCIUM 40 MG PO TABS: 40.0000 mg | ORAL_TABLET | Freq: Every day | ORAL | Status: DC

## 2024-01-18 MED ORDER — ACETAMINOPHEN 325 MG PO TABS: 650.0000 mg | ORAL_TABLET | Freq: Four times a day (QID) | ORAL | Status: DC | PRN

## 2024-01-18 MED ORDER — ASPIRIN 81 MG PO TBEC
81.0000 mg | DELAYED_RELEASE_TABLET | Freq: Every day | ORAL | Status: DC
  Administered 2024-01-19 – 2024-01-20 (×2): 81 mg via ORAL
  Filled 2024-01-18 (×3): qty 1

## 2024-01-18 MED ORDER — PREDNISOLONE ACETATE 1 % OP SUSP
1.0000 [drp] | Freq: Every day | OPHTHALMIC | Status: DC
  Administered 2024-01-18 – 2024-01-20 (×3): 1 [drp] via OPHTHALMIC
  Filled 2024-01-18: qty 5

## 2024-01-18 MED ORDER — ENOXAPARIN SODIUM 40 MG/0.4ML IJ SOSY: 40.0000 mg | PREFILLED_SYRINGE | INTRAMUSCULAR | Status: DC

## 2024-01-18 MED ORDER — HEPARIN SODIUM (PORCINE) 5000 UNIT/ML IJ SOLN
5000.0000 [IU] | Freq: Three times a day (TID) | INTRAMUSCULAR | Status: DC
  Administered 2024-01-18 – 2024-01-20 (×6): 5000 [IU] via SUBCUTANEOUS
  Filled 2024-01-18 (×7): qty 1

## 2024-01-18 MED ORDER — PANTOPRAZOLE SODIUM 40 MG IV SOLR
40.0000 mg | Freq: Every day | INTRAVENOUS | Status: DC
  Administered 2024-01-18 – 2024-01-19 (×2): 40 mg via INTRAVENOUS
  Filled 2024-01-18 (×2): qty 10

## 2024-01-18 MED ORDER — PROSOURCE TF20 ENFIT COMPATIBL EN LIQD
60.0000 mL | Freq: Three times a day (TID) | ENTERAL | Status: DC
  Administered 2024-01-18: 60 mL
  Filled 2024-01-18 (×3): qty 60

## 2024-01-18 MED ORDER — SODIUM CHLORIDE 0.9% FLUSH
3.0000 mL | Freq: Two times a day (BID) | INTRAVENOUS | Status: DC
  Administered 2024-01-18 – 2024-01-20 (×4): 3 mL via INTRAVENOUS

## 2024-01-18 MED ORDER — LACTATED RINGERS IV SOLN: INTRAVENOUS | Status: DC

## 2024-01-18 MED ORDER — INSULIN ASPART 100 UNIT/ML IJ SOLN: 0.0000 [IU] | Freq: Every day | INTRAMUSCULAR | Status: DC

## 2024-01-18 MED ORDER — ATORVASTATIN CALCIUM 40 MG PO TABS
40.0000 mg | ORAL_TABLET | Freq: Every day | ORAL | Status: DC
  Administered 2024-01-18 – 2024-01-19 (×2): 40 mg
  Filled 2024-01-18 (×2): qty 1

## 2024-01-18 MED ORDER — BRINZOLAMIDE 1 % OP SUSP
1.0000 [drp] | Freq: Three times a day (TID) | OPHTHALMIC | Status: DC
  Administered 2024-01-19 – 2024-01-20 (×5): 1 [drp] via OPHTHALMIC
  Filled 2024-01-18 (×2): qty 10

## 2024-01-18 MED ORDER — ACETAMINOPHEN 650 MG RE SUPP
650.0000 mg | Freq: Four times a day (QID) | RECTAL | Status: DC | PRN
Start: 2024-01-18 — End: 2024-01-18

## 2024-01-18 MED ORDER — OSMOLITE 1.5 CAL PO LIQD
237.0000 mL | Freq: Three times a day (TID) | ORAL | Status: DC
  Administered 2024-01-18: 237 mL
  Filled 2024-01-18 (×2): qty 237

## 2024-01-18 MED ORDER — LATANOPROST 0.005 % OP SOLN
1.0000 [drp] | Freq: Every day | OPHTHALMIC | Status: DC
  Administered 2024-01-18 – 2024-01-19 (×2): 1 [drp] via OPHTHALMIC
  Filled 2024-01-18: qty 2.5

## 2024-01-18 MED ORDER — POLYVINYL ALCOHOL 1.4 % OP SOLN
1.0000 [drp] | OPHTHALMIC | Status: DC
  Administered 2024-01-18 – 2024-01-20 (×28): 1 [drp] via OPHTHALMIC
  Filled 2024-01-18 (×2): qty 15

## 2024-01-18 MED ORDER — FREE WATER
300.0000 mL | Status: DC
  Administered 2024-01-18 – 2024-01-20 (×13): 300 mL

## 2024-01-18 MED ORDER — METHIMAZOLE 5 MG PO TABS
5.0000 mg | ORAL_TABLET | Freq: Every day | ORAL | Status: DC
  Administered 2024-01-19 – 2024-01-20 (×2): 5 mg
  Filled 2024-01-18 (×2): qty 1

## 2024-01-18 MED ORDER — BRIMONIDINE TARTRATE 0.2 % OP SOLN
1.0000 [drp] | Freq: Three times a day (TID) | OPHTHALMIC | Status: DC
  Administered 2024-01-19 – 2024-01-20 (×5): 1 [drp] via OPHTHALMIC
  Filled 2024-01-18 (×2): qty 5

## 2024-01-18 MED ORDER — PANTOPRAZOLE SODIUM 40 MG PO TBEC
40.0000 mg | DELAYED_RELEASE_TABLET | Freq: Every day | ORAL | Status: DC
Start: 2024-01-18 — End: 2024-01-18

## 2024-01-18 MED ORDER — SODIUM CHLORIDE 0.9 % IV SOLN: 1.5000 g | Freq: Four times a day (QID) | INTRAVENOUS | Status: DC

## 2024-01-18 MED ORDER — ARTIFICIAL TEARS OPHTHALMIC OINT
TOPICAL_OINTMENT | Freq: Two times a day (BID) | OPHTHALMIC | Status: DC
  Filled 2024-01-18: qty 3.5

## 2024-01-18 MED ORDER — VARENICLINE TARTRATE 0.03 MG/ACT NA SOLN
1.0000 | Freq: Two times a day (BID) | NASAL | Status: DC
  Administered 2024-01-18 – 2024-01-20 (×4): 1 via NASAL
  Filled 2024-01-18 (×2): qty 4.2

## 2024-01-18 MED ORDER — SODIUM CHLORIDE 0.9 % IV SOLN
1.5000 g | Freq: Two times a day (BID) | INTRAVENOUS | Status: DC
  Administered 2024-01-18 – 2024-01-20 (×5): 1.5 g via INTRAVENOUS
  Filled 2024-01-18 (×6): qty 4

## 2024-01-18 MED ORDER — LIFITEGRAST 5 % OP SOLN
1.0000 [drp] | Freq: Two times a day (BID) | OPHTHALMIC | Status: DC
  Administered 2024-01-18 – 2024-01-20 (×4): 1 [drp] via OPHTHALMIC
  Filled 2024-01-18: qty 5

## 2024-01-18 NOTE — ED Notes (Addendum)
 Pt choke on apple juice brought by wife, ec asprin held, secure chat sent to [facility] MD and Berneice RN told in report.

## 2024-01-18 NOTE — ED Notes (Signed)
 Spoke with wife.  She will be here in a bit. Pt is HOH of wife walked me through installing hearing aids so he is hearing better now.

## 2024-01-18 NOTE — H&P (Signed)
 History and Physical    Patient: John Walton FMW:[id number] DOB: September 29, 1932 DOA: 01/17/2024 DOS: the patient was seen and examined on 01/18/2024 PCP: Waymond Ryan Askew, MD  Patient coming from: Home-lives with wife who is primary caretaker.  Retired Investment banker, operational.   Chief Complaint:  Chief Complaint  Patient presents with   Near Syncope   HPI: John Walton is a 89 y.o. male with medical history significant of diabetes mellitus 2, hyperthyroidism on Tapazole , CAD, hypertension, aortic stenosis, dyslipidemia, CKD stage IV with chronic bilateral hydronephrosis.  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  He has chronic aspiration physiology.  He has a PEG tube in place and receives both free water  flushes as well as intermittent bolus tube feedings.  Patient was brought to the ED via EMS for reports of shortness of breath, coughing and vomiting as well as diarrhea.  Patient was sitting on a chair and had a syncopal episode while fire department was evaluating him.  Upon arrival to triage patient was denying complaints.  In the ER he was afebrile and hemodynamically st initial labs revealed hyponatremia in context of glucose 214, renal function stable with a BUN of 46 and creatinine of 2.32.  Mild elevation in troponin 48 and 37 consistent with demand ischemia.  White count was 9500 with a hemoglobin 9.8 platelets 246,000 with no differential obtained.  Urinalysis unremarkable except for mild glycosuria and mild proteinuria.  Chest x-ray revealed asymmetric patchy consolidation and linear atelectasis in the left lower base possibly representing a combination of atelectasis pneumonia and or aspiration.  There was a very small left pleural effusion as well.  CT of the head was unremarkable.  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  There was also generalized age-related cerebral atrophy with moderate chronic microvascular ischemic disease.  Hospital service was asked to evaluate the patient for admission regarding aspiration pneumonia and syncopal episode.  Review of Systems: As above.  Patient has some mild short-term memory deficits but was reporting history that differs from history obtained in triage.  He does not recall having a syncopal episode and questions the validity of that report.  He denied any vomiting or diarrhea.  He stated he did not understand why we thought he might be dehydrated due to the amount of water  that he reports drinking as well as water  being placed into his PEG tube.  Past Medical History:  Diagnosis Date   Anemia    Carotid artery occlusion    Chicken pox    Chronic kidney disease    Coronary artery disease    Diabetes mellitus without complication (HCC)    Frequent headaches    Glaucoma    Hay fever    History of blood transfusion    HTN (hypertension)    Hyperlipidemia    Hyperlipidemia    Hypothyroidism    Migraines    Peripheral vascular disease    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[facility])    Past Surgical History:  Procedure Laterality Date   CHOLECYSTECTOMY, LAPAROSCOPIC     ENDARTERECTOMY Right 12/22/2020   Procedure: RIGHT CAROTID ENDARTERECTOMY;  Surgeon: Eliza Lonni RAMAN, MD;  Location: [facility];  Service: Vascular;  Laterality: Right;   IR GASTROSTOMY TUBE MOD SED  03/29/2020   TONSILLECTOMY  04/01/1940   TOTAL HIP ARTHROPLASTY Left 03/08/2020  Procedure: TOTAL HIP ARTHROPLASTY;  Surgeon: Barbarann Oneil BROCKS, MD;  Location: [facility] ORS;  Service: Orthopedics;  Laterality: Left;   TRANSURETHRAL RESECTION OF PROSTATE N/A 12/14/2020   Procedure: TRANSURETHRAL RESECTION OF THE PROSTATE (TURP);  Surgeon: Matilda Senior, MD;  Location: [facility] ORS;  Service: Urology;  Laterality: N/A;  2 HRS   Social History:  reports that he quit smoking about 47 years ago. His smoking use  included cigarettes. He started smoking about 55 years ago. He has never used smokeless tobacco. He reports that he does not currently use alcohol . He reports that he does not use drugs.  Allergies  Allergen Reactions   Other Swelling and Other (See Comments)    ALL COUGH MEDICATIONS   Influenza Vaccines Other (See Comments)    Unknown   Robitussin Cold Cough+ Chest [Dextromethorphan -Guaifenesin ] Swelling and Other (See Comments)    Laryngeal edema - any cough syrup; Wife states Robitussin and ANY cough syrup closes patient's throat    Family History  Problem Relation Age of Onset   Liver disease Mother    Skin cancer Father    Skin cancer Paternal Grandfather    Diabetes Maternal Grandfather     Prior to Admission medications   Medication Sig Start Date End Date Taking? Authorizing Provider  acetaminophen  (TYLENOL ) 325 MG tablet Take 650 mg by mouth every 6 (six) hours as needed for pain.    [provider]  aspirin  EC 81 MG tablet Take 1 tablet (81 mg total) by mouth daily. 12/26/20   Singh, Prashant K, MD  atorvastatin  (LIPITOR) 40 MG tablet Take 40 mg by mouth at bedtime.    [provider]  Calcium  Carb-Cholecalciferol  (OYSTER SHELL CALCIUM  W/D) 500-5 MG-MCG TABS Take 1 tablet by mouth in the morning and at bedtime. 10/31/20   [provider]  Calcium -Vitamin D  500-3.125 MG-MCG TABS Take 1 tablet by mouth in the morning and at bedtime.    [provider]  carboxymethylcellulose (REFRESH PLUS) 0.5 % SOLN Place 1 drop into both eyes every hour while awake.    [provider]  ferrous sulfate  325 (65 FE) MG tablet Take 1 tablet (325 mg total) by mouth daily with breakfast. 12/26/20   Singh, Prashant K, MD  HYDROcodone -Acetaminophen  (NORCO PO) Take 1 tablet by mouth as needed (pain).    [provider]  latanoprost  (XALATAN ) 0.005 % ophthalmic solution Place 1 drop into both eyes at bedtime.  03/13/19   [provider]   Lifitegrast  (XIIDRA ) 5 % SOLN Place 1 drop into both eyes in the morning and at bedtime.    [provider]  loperamide  (IMODIUM ) 2 MG capsule Take 2 mg by mouth as needed for diarrhea or loose stools.    [provider]  methimazole  (TAPAZOLE ) 5 MG tablet Take 5 mg by mouth daily. 04/07/23   [provider]  mupirocin  ointment (BACTROBAN ) 2 % Place 1 Application into the nose 2 (two) times daily. 06/20/23   Will Almarie MATSU, MD  Nutritional Supplements (FEEDING SUPPLEMENT, OSMOLITE 1.5 CAL,) LIQD Place 237 mLs into feeding tube as needed (when not getting enough nutrition). 04/03/20   [provider]  Nutritional Supplements (PROSOURCE) LIQD Give 45 mLs by tube in the morning and at bedtime.    [provider]  pantoprazole  (PROTONIX ) 40 MG tablet Take 40 mg by mouth as needed for heartburn.    [provider]  prednisoLONE  acetate (PRED FORTE ) 1 % ophthalmic suspension Place 1 drop into the  left eye daily. 05/13/23   [provider]  SIMBRINZA 1-0.2 % SUSP Place 1 drop into both eyes in the morning and at bedtime.  05/16/19   [provider]  triamcinolone ointment (KENALOG) 0.1 % Apply 1 Application topically daily as needed (for gastrostomy site). 03/10/23   [provider]  Varenicline  Tartrate 0.03 MG/ACT SOLN Place 1 spray into both nostrils 2 (two) times daily. 06/16/23   [provider]  vitamin B-12 (CYANOCOBALAMIN ) 500 MCG tablet Take 1 tablet by mouth 2 (two) times daily. 01/03/21   [provider]  Water  For Irrigation, Sterile (FREE WATER ) SOLN Place 120 mLs into feeding tube 5 (five) times daily. 04/03/20   Cheryle Page, MD  White Petrolatum-Mineral Oil (TEARS AGAIN) OINT Place 1 application  into both eyes in the morning and at bedtime. Refresh PM    [provider]    Physical Exam: Vitals:   01/18/24 0630 01/18/24 0729 01/18/24 0820 01/18/24 1128  BP: (!) 119/57  122/68 133/65   Pulse: 92  97 93  Resp: 20  18 (!) 26  Temp:  98.8 F (37.1 C) 98.1 F (36.7 C)   TempSrc:   Oral   SpO2: 98%  97% 99%  Weight:      Height:       Constitutional: NAD, calm, comfortable Respiratory: clear but coarse to auscultation bilaterally, no wheezing, no crackles. Normal respiratory effort. No accessory muscle use.  Room air Cardiovascular: Regular rate and rhythm, no rubs / gallops.  Loud grade 4/6 systolic murmur best heard just lateral to the left sternal border/mediastinum although also heard left sternal border fifth intercostal space as well.  No extremity edema. 2+ pedal pulses.  Abdomen: no tenderness, no masses palpated. No hepatosplenomegaly. Bowel sounds positive.  Musculoskeletal: no clubbing / cyanosis. No joint deformity upper and lower extremities. Good ROM, no contractures. Normal muscle tone.  Skin: no rashes, lesions, ulcers. No induration Neurologic: CN 2-12 grossly intact. Sensation intact, Strength 4/5 x all 4 extremities.  Psychiatric: Alert and oriented x name and place. Normal mood.     Data Reviewed:  Sodium 131, potassium 4.8, CO2 19, glucose 214, BUN 46, creatinine 2.32, calcium  8.4, anion gap 11, albumin  3, GFR 26  Troponin 48 and 37  CRP 4.1, procalcitonin 0.14  WBC 9500 differential not obtained, hemoglobin 9.8, platelets 246,000  TSH 0.286-previous TSH in March of this year was 4.074  Assessment and Plan: Syncopal episode EMS called to patient's home initially due to reports of shortness of breath but during treatment at the home patient had a witnessed syncopal episode Patient also was found to have suboptimal blood pressure readings at the time and was found to be experiencing vomiting and diarrhea per history obtained and therefore was given a 500 cc normal saline bolus Vital signs stable since arrival Patient has underlying aortic stenosis which could have contributed to syncopal episode-see additional workup below Telemetry monitoring  to rule out arrhythmia Continue maintenance IV fluids Orthostatic vital signs  Known aortic stenosis Last echocardiogram September 2023 with notable mild aortic stenosis with a gradient of 9 mmHg and a V-max of 2.09 Has a very loud murmur upon exam and given presentation with syncopal episode I have opted to repeat echocardiogram this admission  DISH with osteophyte compression on esophagus Dysphagia with chronic aspiration X-ray concerning for pneumonia process likely aspiration etiology Elevated CRP and mildly elevated procalcitonin Initiate Unasyn Per speech therapy patient has pharyngeal dysphagia secondary to mechanical factors.  At time of last evaluation on 10/13 quantity of material entered the larynx was trace but over time penetrated thin liquid would reach the vocal folds eliciting a spontaneous throat clearing.  Plans were to crush pills or deliver via G-tube to avoid getting stuck in the vallecula. At home patient apparently was getting 600 cc of free water  per tube 3 times daily and was otherwise eating diet by mouth and had orders to give Osmolite 1.5 237 cc as needed if patient and wife felt oral intake was an adequate.  Patient reports intake is similar to intake prior to diagnosis with dysphagia Given current episode of likely aspiration pneumonia I have opted to decrease the volume of free water  and break it up into more frequent installations.  Until clarified with wife have ordered Osmolite 1.5 Cal 3 times daily scheduled.  Have also asked nutrition to evaluate the patient to ensure current regimen is appropriate.  Diabetes mellitus 2 Sugar at presentation was greater than 200 with associated pseudohyponatremia Follow CBGs and provide SSI Apparently was not on medications prior to admission Last known hemoglobin A1c was 6.5 in 2022-checked this admission  Hyperthyroidism ? 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 Observation status is judged to be reasonable and necessary in order to provide the required intensity of service to ensure the patient's safety. The patient's presenting symptoms, physical exam findings, and initial radiographic and laboratory data in the context of their medical condition is felt to place them at decreased risk for further clinical deterioration. Furthermore, it is anticipated that the patient will be medically stable for discharge from the hospital within 2 midnights of admission.   Author: Isaiah Lever, NP 01/18/2024 11:38 AM  For on call review [email or web address].

## 2024-01-18 NOTE — Hospital Course (Addendum)
 89 y.o. M with CKD IV baseline 2-2.5, CAD/PVD, HTN, DM, stroke, mild left sided weakness, hyperthyroidism on methimazole , hx right CEA, HLD, aortic stenosis and severe dysphagia due to cervical spine osteophytes requiring PEG tube who presented with syncope.  To me, wife says he was essentially at his baseline until the day of admission, he was sitting at the table with [facility] aide when he slumped over and passed out.  EMS was activated, and when they arrived, he passed out again and vomited.    In the ER, WBC normal, Hgb 9.8 stable relative to baseline.  Cr at baseline.  Electrolytes unremarkable, ECG normal rhythm.    CXR showed possible patchy opacity, CTH unchanged.  Given antibiotics and hospitalists asked to evaluate for syncope.     BP 133/65   Pulse 93   Temp 97.8 F (36.6 C) (Oral)   Resp (!) 26   Ht 5' 6 (1.676 m)   Wt 70 kg   SpO2 99%   BMI 24.91 kg/m   Elderly chronically ill appearing adult male Bilateral conjunctivitis, OP dry, no oral lesions.   Tachycardic, regular, loud systolic murmur, no JVD or edema Respiratory rate normal, lungs clear without rales or wheezing. Abdomen soft no tenderness palpation or guarding, no ascites or distention Very hard of hearing, and seems to have short-term memory impairment.  Face symmetric, upper extremity strength weak but symmetric.    Syncope -Fluids overnight - Check orthostatics - Monitor on telemetry - PT/OT - See below re: aortic stenosis  Possible aspiration pneumonia Unclear if this is infiltrate or aspiration.  Given vomiting with EMS, would favor that it is acute today. -Continue Unasyn for now  Aortic stenosis Per wife's report, he recently had an echo at the [facility] with Dr. Ellard, and there are some plans to discuss TAVR although there is uncertainty about it as he had a stroke after anesthesia some years ago. -Obtain echocardiogram  Severe dysphagia Patient has had a PEG tube for years, however since his throat  closed up due to an allergic reaction in 2021.  Recent MBS notes imply however that this is mostly because of DISH and cervical spinal osteophytes.  From MBS notes and wife, it is clear he eats a regular consistency diet, and just uses the PEG for fluid supplementation. - SLP consult - Despite #2 above, he has a long-standing history of dysphagia and his swallowing is at baseline per wife and they are aware of the potential harms of aspiration  Chronic kidney disease stage IV Cr at baseline  Hyperthyroidism TSH low - Continue methimazole  - Follow T3, fT4  Coronary artery disease Peripheral vascular disease Hypertension Cerebrovascular disease - Continue Lipitor - Resume aspirin   Diabetes - Check A1c - SSI

## 2024-01-19 ENCOUNTER — Observation Stay (HOSPITAL_COMMUNITY)

## 2024-01-19 ENCOUNTER — Encounter (HOSPITAL_COMMUNITY): Payer: Self-pay | Admitting: Family Medicine

## 2024-01-19 DIAGNOSIS — I350 Nonrheumatic aortic (valve) stenosis: Secondary | ICD-10-CM

## 2024-01-19 DIAGNOSIS — R55 Syncope and collapse: Secondary | ICD-10-CM | POA: Diagnosis not present

## 2024-01-19 LAB — ECHOCARDIOGRAM COMPLETE
AR max vel: 0.88 cm2
AV Area VTI: 0.78 cm2
AV Area mean vel: 0.85 cm2
AV Mean grad: 22 mmHg
AV Peak grad: 37.9 mmHg
Ao pk vel: 3.08 m/s
Calc EF: 33.4 %
Height: 66 in
MV VTI: 1.85 cm2
S' Lateral: 3.7 cm
Single Plane A2C EF: 35.5 %
Single Plane A4C EF: 33.4 %
Weight: 2469.15 [oz_av]

## 2024-01-19 LAB — CBG MONITORING, ED
Glucose-Capillary: 128 mg/dL — ABNORMAL HIGH (ref 70–99)
Glucose-Capillary: 126 mg/dL — ABNORMAL HIGH (ref 70–99)

## 2024-01-19 LAB — BASIC METABOLIC PANEL WITH GFR
Anion gap: 10 (ref 5–15)
BUN: 48 mg/dL — ABNORMAL HIGH (ref 8–23)
CO2: 19 mmol/L — ABNORMAL LOW (ref 22–32)
Calcium: 8.2 mg/dL — ABNORMAL LOW (ref 8.9–10.3)
Chloride: 104 mmol/L (ref 98–111)
Creatinine, Ser: 2.05 mg/dL — ABNORMAL HIGH (ref 0.61–1.24)
GFR, Estimated: 30 mL/min — ABNORMAL LOW (ref >60)
Glucose, Bld: 162 mg/dL — ABNORMAL HIGH (ref 70–99)
Potassium: 4.6 mmol/L (ref 3.5–5.1)
Sodium: 133 mmol/L — ABNORMAL LOW (ref 135–145)

## 2024-01-19 LAB — CBC
HCT: 27.7 % — ABNORMAL LOW (ref 39.0–52.0)
Hemoglobin: 9 g/dL — ABNORMAL LOW (ref 13.0–17.0)
MCH: 29.9 pg (ref 26.0–34.0)
MCHC: 32.5 g/dL (ref 30.0–36.0)
MCV: 92 fL (ref 80.0–100.0)
Platelets: 229 K/uL (ref 150–400)
RBC: 3.01 MIL/uL — ABNORMAL LOW (ref 4.22–5.81)
RDW: 12.5 % (ref 11.5–15.5)
WBC: 6.6 K/uL (ref 4.0–10.5)
nRBC: 0 % (ref 0.0–0.2)

## 2024-01-19 MED ORDER — OSMOLITE 1.2 CAL PO LIQD
237.0000 mL | Freq: Once | ORAL | Status: AC
  Administered 2024-01-19: 237 mL
  Filled 2024-01-19: qty 237

## 2024-01-19 NOTE — Progress Notes (Signed)
 New Admission Note:   Arrival Method: stretcher Mental Orientation: awake, alert and oriented to person, place and situation, disoriented to time Telemetry: SR Assessment: Completed Skin: c/d/i IV: SL Pain: denies Tubes: PEG tube to abdomen C/D/I Safety Measures: Safety Fall Prevention Plan has been given, discussed and signed Admission: Completed [facility] Orientation: Patient has been orientated to the room, unit and staff.  Family: not present  Orders have been reviewed and implemented. Will continue to monitor the patient. Call light has been placed within reach and bed alarm has been activated.   Doyal Sias, RN

## 2024-01-19 NOTE — Care Management Obs Status (Signed)
 MEDICARE OBSERVATION STATUS NOTIFICATION   Patient Details  Name: KORVER GRAYBEAL MRN: [id number] Date of Birth: 10/15/1932   Medicare Observation Status Notification Given:  Yes    Nena LITTIE Coffee, RN 01/19/2024, 1:25 PM

## 2024-01-19 NOTE — Evaluation (Signed)
 Clinical/Bedside Swallow Evaluation Patient Details  Name: John Walton MRN: [id number] Date of Birth: 1932-07-25  Today's Date: 01/19/2024 Time: SLP Start Time (ACUTE ONLY): 1419 SLP Stop Time (ACUTE ONLY): 1439 SLP Time Calculation (min) (ACUTE ONLY): 20 min  Past Medical History:  Past Medical History:  Diagnosis Date   Anemia    Carotid artery occlusion    Chicken pox    Chronic kidney disease    Coronary artery disease    Diabetes mellitus without complication (HCC)    Frequent headaches    Glaucoma    Hay fever    History of blood transfusion    HTN (hypertension)    Hyperlipidemia    Hyperlipidemia    Hypothyroidism    Migraines    Peripheral vascular disease    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[facility])    Past Surgical History:  Past Surgical History:  Procedure Laterality Date   CHOLECYSTECTOMY, LAPAROSCOPIC     ENDARTERECTOMY Right 12/22/2020   Procedure: RIGHT CAROTID ENDARTERECTOMY;  Surgeon: Eliza Lonni RAMAN, MD;  Location: [facility] OR;  Service: Vascular;  Laterality: Right;   IR GASTROSTOMY TUBE MOD SED  03/29/2020   TONSILLECTOMY  04/01/1940   TOTAL HIP ARTHROPLASTY Left 03/08/2020   Procedure: TOTAL HIP ARTHROPLASTY;  Surgeon: Barbarann Oneil BROCKS, MD;  Location: [facility] ORS;  Service: Orthopedics;  Laterality: Left;   TRANSURETHRAL RESECTION OF PROSTATE N/A 12/14/2020   Procedure: TRANSURETHRAL RESECTION OF THE PROSTATE (TURP);  Surgeon: Matilda Senior, MD;  Location: [facility] ORS;  Service: Urology;  Laterality: N/A;  2 HRS   HPI:  John Walton is an 89 y.o. male who presented to [facility] ED on 10/19 with syncope and vomiting episode.  Pt known to this clinician from 07/18/23 and 01/12/24 OP MBS studies. Hx of chronic pharyngeal dysphagia due to DISH with occasional aspiration but no episodes of pna. PEG for hydration; continues to eat regular diet. PMHx includes right carotid artery stenosis, hypertension, diabetes, hyperlipidemia, CKD stage IIIb-IV, CVA, BPH status post  TURP, hyperthyroidism, PAD, CAD, aortic stenosis. CT cervical spine 02/12/23:  Large flowing anterior osteophytes throughout the cervical spine and upper thoracic spine. MBS 01/19/24 penetration liquids, solid food residue, no large volume aspiration (c/w prior findings).    Assessment / Plan / Recommendation  Clinical Impression  Pt presents with acute on chronic dysphagia with a decreased toleration of thin liquids and consistent symptoms of aspiration.  Recommend temporary modification of diet to regular solids, nectar thick liquids - may help as an extra layer of protection in the event he is more susceptible to developing a pna. Anticipate advancement back to thin liquids in the next 24-48 hours.  D/W pt - he is HOH and has some mild confusion at baseline, but today he was a bit more confused than is typical. Family not at the bedside. SLP will follow while admitted.   SLP Visit Diagnosis: Dysphagia, pharyngeal phase (R13.13)    Aspiration Risk  Mild aspiration risk    Diet Recommendation   Nectar;Age appropriate regular  Medication Administration: Via alternative means    Other  Recommendations Oral Care Recommendations: Oral care BID     Assistance Recommended at Discharge  Family, caregiver  Functional Status Assessment    Frequency and Duration min 2x/week  1 week       Prognosis        Swallow Study   General HPI: John Walton is an 89 y.o. male who presented to [facility] ED on 10/19 with syncope and vomiting  episode.  Pt known to this clinician from 07/18/23 and 01/12/24 OP MBS studies. Hx of chronic pharyngeal dysphagia due to DISH with occasional aspiration but no episodes of pna. PEG for hydration; continues to eat regular diet. PMHx includes right carotid artery stenosis, hypertension, diabetes, hyperlipidemia, CKD stage IIIb-IV, CVA, BPH status post TURP, hyperthyroidism, PAD, CAD, aortic stenosis. CT cervical spine 02/12/23:  Large flowing anterior osteophytes throughout the  cervical spine and upper thoracic spine. MBS 01/19/24 penetration liquids, solid food residue, no large volume aspiration (c/w prior findings). Type of Study: Bedside Swallow Evaluation Previous Swallow Assessment: see HPI Diet Prior to this Study: Regular;Thin liquids (Level 0) Temperature Spikes Noted: No Respiratory Status: Room air History of Recent Intubation: No Behavior/Cognition: Alert;Cooperative;Pleasant mood Oral Cavity Assessment: Within Functional Limits Oral Care Completed by SLP: No Oral Cavity - Dentition: Adequate natural dentition;Missing dentition Vision: Functional for self-feeding Self-Feeding Abilities: Needs assist Patient Positioning: Upright in bed Baseline Vocal Quality: Normal Volitional Cough: Strong Volitional Swallow: Able to elicit    Oral/Motor/Sensory Function Overall Oral Motor/Sensory Function: Within functional limits   Ice Chips Ice chips: Not tested   Thin Liquid Thin Liquid: Impaired Presentation: Cup;Straw Pharyngeal  Phase Impairments: Cough - Immediate    Nectar Thick Nectar Thick Liquid: Within functional limits   Honey Thick Honey Thick Liquid: Not tested   Puree Puree: Within functional limits   Solid     Solid: Within functional limits      John Walton 01/19/2024,3:11 PM   John Bendall L. [name], MA CCC/SLP Clinical Specialist - Acute Care SLP Acute Rehabilitation Services Office number [phone]

## 2024-01-19 NOTE — Evaluation (Signed)
 Occupational Therapy Evaluation Patient Details Name: John Walton MRN: [id number] DOB: [birth year] Today's Date: 01/19/2024   History of Present Illness   89 y.o. M admitted 10/18 with syncope.  PMH: CKD IV baseline 2-2.5, CAD/PVD, HTN, DM, stroke, mild left sided weakness, hyperthyroidism on methimazole , hx right CEA, HLD, aortic stenosis and severe dysphagia due to cervical spine osteophytes requiring PEG tube     Clinical Impressions Pt typically walks with a RW and supervision. He has walkers on both floors of his home and a stair lift. Pt is assisted for bathing and dressing by an aide who comes 6 days a week for a total of 28 hours. His wife assists with IADLs and management of feeding tube. Pt presents with disorientation to time, states he has been here 10 days. No family available to determine baseline cognition. Pt came to EOB with moderate assistance and stood with +2 moderate assistance for orthostatic vitals (see flow sheet). He denied dizziness. He demonstrated posterior bias upon initially sitting and standing. Pt able to take steps with B hand held assist. Anticipate pt will progress to home if level of assist is as he describes. Recommending HHOT for strengthening and to maximize independence in ADLs.     If plan is discharge home, recommend the following:   A little help with walking and/or transfers;A lot of help with bathing/dressing/bathroom;Assistance with cooking/housework;Direct supervision/assist for medications management;Direct supervision/assist for financial management;Assist for transportation;Help with stairs or ramp for entrance     Functional Status Assessment   Patient has had a recent decline in their functional status and demonstrates the ability to make significant improvements in function in a reasonable and predictable amount of time.     Equipment Recommendations   None recommended by OT     Recommendations for Other Services          Precautions/Restrictions   Precautions Precautions: Fall Restrictions Weight Bearing Restrictions Per Provider Order: No     Mobility Bed Mobility Overal bed mobility: Needs Assistance Bed Mobility: Supine to Sit, Sit to Supine     Supine to sit: Mod assist, HOB elevated, +2 for safety/equipment Sit to supine: Supervision   General bed mobility comments: min for LEs (mostly tactile cue) and to raise trunk, able to return to supine without physical asssit    Transfers Overall transfer level: Needs assistance Equipment used: 2 person hand held assist Transfers: Sit to/from Stand Sit to Stand: Mod assist, +2 physical assistance, From elevated surface           General transfer comment: light mod assist from stretcher, increased time to extend knees and stand fully upright, posterior bias initially      Balance Overall balance assessment: Needs assistance   Sitting balance-Leahy Scale: Fair Sitting balance - Comments: posterior lean initially, unable to stabilize feet due to height of stretcher   Standing balance support: Bilateral upper extremity supported, During functional activity Standing balance-Leahy Scale: Poor Standing balance comment: relies on UE support and min assist static stance                           ADL either performed or assessed with clinical judgement   ADL Overall ADL's : Needs assistance/impaired Eating/Feeding: Set up;Sitting   Grooming: Minimal assistance;Standing   Upper Body Bathing: Moderate assistance;Sitting   Lower Body Bathing: Maximal assistance;Sit to/from stand   Upper Body Dressing : Minimal assistance;Sitting   Lower Body Dressing: Maximal assistance;Sit to/from stand  Toilet Transfer: +2 for physical assistance;Minimal assistance;Ambulation           Functional mobility during ADLs: +2 for physical assistance;Minimal assistance General ADL Comments: hand held assist in absence of RW     Vision  Ability to See in Adequate Light: 1 Impaired Patient Visual Report: No change from baseline Additional Comments: reports poor acuity, can see large/bold print on OTs name badge     Perception         Praxis         Pertinent Vitals/Pain Pain Assessment Pain Assessment: No/denies pain     Extremity/Trunk Assessment Upper Extremity Assessment Upper Extremity Assessment: Generalized weakness;Right hand dominant;RUE deficits/detail;LUE deficits/detail RUE Deficits / Details: shoulder flexion limitations due to kyphosis LUE Deficits / Details: shoulder limitations due to kyphosis   Lower Extremity Assessment Lower Extremity Assessment: Defer to PT evaluation   Cervical / Trunk Assessment Cervical / Trunk Assessment: Kyphotic   Communication Communication Communication: Impaired Factors Affecting Communication: Hearing impaired   Cognition Arousal: Alert Behavior During Therapy: WFL for tasks assessed/performed Cognition: No family/caregiver present to determine baseline             OT - Cognition Comments: Pt with poor awareness of time, thinks he has been here 10 days, unaware it is Monday.                 Following commands: Intact       Cueing  General Comments   Cueing Techniques: Verbal cues;Gestural cues  see flow sheet for orthostatic vitals   Exercises     Shoulder Instructions      Home Living Family/patient expects to be discharged to:: Private residence Living Arrangements: Spouse/significant other Available Help at Discharge: Family;Personal care attendant (aide 6 days a week for 28 hours total) Type of Home: House Home Access: Ramped entrance     Home Layout: Two level;1/2 bath on main level;Bed/bath upstairs Alternate Level Stairs-Number of Steps: has stair lift   Bathroom Shower/Tub: Walk-in shower     Bathroom Accessibility: Yes How Accessible: Accessible via walker Home Equipment: Rolling Walker (2 wheels);Rollator (4  wheels);Wheelchair - manual;Shower seat;Grab bars - tub/shower;Grab bars - toilet;Toilet riser;Hospital bed          Prior Functioning/Environment Prior Level of Function : Needs assist             Mobility Comments: uses RW inside and rollator if going outside but pt reports someone is usually with him ADLs Comments: aide helps with bathing and dressing, wife assists with IADLs, 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    OT Problem List: Decreased cognition;Impaired balance (sitting and/or standing);Decreased strength   OT Treatment/Interventions: Self-care/ADL training;DME and/or AE instruction;Therapeutic activities;Patient/family education;Balance training      OT Goals(Current goals can be found in the care plan section)   Acute Rehab OT Goals OT Goal Formulation: With patient Time For Goal Achievement: 02/02/24 Potential to Achieve Goals: Good ADL Goals Pt Will Perform Grooming: with supervision;standing (at least 2 activities) Pt Will Transfer to Toilet: with supervision;ambulating;bedside commode Pt Will Perform Toileting - Clothing Manipulation and hygiene: with supervision;sit to/from stand Additional ADL Goal #1: Pt will complete bed mobility with supervision in preparation for ADLs.   OT Frequency:  Min 2X/week    Co-evaluation PT/OT/SLP Co-Evaluation/Treatment: Yes Reason for Co-Treatment: For patient/therapist safety PT goals addressed during session: Mobility/safety with mobility OT goals addressed during session: ADL's and self-care      AM-PAC OT 6 Clicks  Daily Activity     Outcome Measure Help from another person eating meals?: None Help from another person taking care of personal grooming?: A Little Help from another person toileting, which includes using toliet, bedpan, or urinal?: A Lot Help from another person bathing (including washing, rinsing, drying)?: A Lot Help from another person to put on and taking off regular upper body clothing?:  A Little Help from another person to put on and taking off regular lower body clothing?: A Lot 6 Click Score: 16   End of Session Equipment Utilized During Treatment: Gait belt  Activity Tolerance: Patient tolerated treatment well Patient left: in bed;with call bell/phone within reach  OT Visit Diagnosis: Unsteadiness on feet (R26.81);Other abnormalities of gait and mobility (R26.89);Muscle weakness (generalized) (M62.81);Other symptoms and signs involving cognitive function                Time: 9081-9042 OT Time Calculation (min): 39 min Charges:  OT General Charges $OT Visit: 1 Visit OT Evaluation $OT Eval Moderate Complexity: 1 Mod  John Walton, John Walton Acute Rehabilitation Services Office: [phone]   John Walton 01/19/2024, 1:23 PM

## 2024-01-19 NOTE — Progress Notes (Addendum)
   01/19/24 1332  TOC Brief Assessment  Insurance and Status Reviewed  Patient has primary care physician Yes  Home environment has been reviewed From home c/wife  Prior level of function: Assisted  Prior/Current Home Services Current home services (pca 5 days/wk)  Social Drivers of Health Review SDOH reviewed no interventions necessary  Readmission risk has been reviewed Yes  Transition of care needs no transition of care needs at this time   Current DME: cane, walker, wc, ramp, stair lift. Sitter 5 days/wk  Transition of Care Department [facility]) has reviewed patient and no other TOC needs have been identified at this time. If new patient needs arise, please place a TOC consult.

## 2024-01-19 NOTE — ED Notes (Signed)
 Pt to ECHO

## 2024-01-19 NOTE — Progress Notes (Signed)
   01/19/24 0915  Orthostatic Lying   BP- Lying 177/90  Pulse- Lying 94  Orthostatic Sitting  BP- Sitting 167/76  Pulse- Sitting 101  Orthostatic Standing at 0 minutes  BP- Standing at 0 minutes (!) 167/105  Pulse- Standing at 0 minutes 104  Orthostatic Standing at 3 minutes  BP- Standing at 3 minutes 162/70  Pulse- Standing at 3 minutes 108   PT took orthostatic VS as above.  OF note, once pt sat back down, BP 106/71.  No dizziness reported throughout.  BP once back on stretcher was 135/91. Full note to follow.  Shelley Cocke M,PT Acute Rehab Services [phone]

## 2024-01-19 NOTE — Progress Notes (Addendum)
 Physical Therapy Evaluation Patient Details Name: John Walton MRN: [id number] DOB: 02/28/1933 Today's Date: 01/19/2024  History of Present Illness  89 y.o. M admitted 10/18 with syncope.  PMH: CKD IV baseline 2-2.5, CAD/PVD, HTN, DM, stroke, mild left sided weakness, hyperthyroidism on methimazole , hx right CEA, HLD, aortic stenosis and severe dysphagia due to cervical spine osteophytes requiring PEG tube  Clinical Impression  Pt admitted with above diagnosis. Pt had difficulty intiially sitting on edge of stretcher however progressed to sitting on edge with CGA.  Pt needed +2 min assist to stand with bil UE support.  Took a few steps with bil UE support and should do well with RW which pt has been using for some time..  Should progress to home with HHPT and assist by wife and PCA.  Pt currently with functional limitations due to the deficits listed below (see PT Problem List). Pt will benefit from acute skilled PT to increase their independence and safety with mobility to allow discharge.           If plan is discharge home, recommend the following: Assistance with cooking/housework;Assist for transportation;Help with stairs or ramp for entrance   Can travel by private vehicle        Equipment Recommendations None recommended by PT  Recommendations for Other Services       Functional Status Assessment Patient has had a recent decline in their functional status and demonstrates the ability to make significant improvements in function in a reasonable and predictable amount of time.     Precautions / Restrictions Precautions Precautions: Fall Restrictions Weight Bearing Restrictions Per Provider Order: No      Mobility  Bed Mobility Overal bed mobility: Needs Assistance Bed Mobility: Supine to Sit     Supine to sit: Mod assist, HOB elevated, +2 for safety/equipment     General bed mobility comments: Pt needed a little assist to move LEs to EOB and mod assist for trunk as  he was leaning posteriorly.    Transfers Overall transfer level: Needs assistance Equipment used: 2 person hand held assist Transfers: Sit to/from Stand Sit to Stand: Mod assist, +2 physical assistance, From elevated surface           General transfer comment: Pt required mod assist of 2 to stand (holding onto back of chair for support once up).  Pt still with posterior lean somewhat but was able to stand for orthostatic BPs.    Ambulation/Gait Ambulation/Gait assistance: Min assist, +2 physical assistance Gait Distance (Feet): 4 Feet Assistive device: 2 person hand held assist Gait Pattern/deviations: Step-through pattern, Decreased stride length, Trunk flexed   Gait velocity interpretation: <1.31 ft/sec, indicative of household ambulator   General Gait Details: Pt was able to ambulate a few steps forward and backward wtih +2 HHA and took a few steps to [facility] as well.  Needed min assist as he does need UE support.  Stairs            Wheelchair Mobility     Tilt Bed    Modified Rankin (Stroke Patients Only)       Balance Overall balance assessment: Needs assistance Sitting-balance support: Feet supported, Bilateral upper extremity supported, Single extremity supported Sitting balance-Leahy Scale: Poor Sitting balance - Comments: relies on support as he leans posteriorly. Postural control: Posterior lean Standing balance support: Bilateral upper extremity supported, During functional activity Standing balance-Leahy Scale: Poor Standing balance comment: relies on UE support and +2 min assist static stance  Pertinent Vitals/Pain      Home Living                          Prior Function                       Extremity/Trunk Assessment   Upper Extremity Assessment Upper Extremity Assessment: Defer to OT evaluation    Lower Extremity Assessment Lower Extremity Assessment: Generalized weakness     Cervical / Trunk Assessment Cervical / Trunk Assessment: Kyphotic  Communication   Communication Communication: Impaired Factors Affecting Communication: Hearing impaired    Cognition Arousal: Alert Behavior During Therapy: WFL for tasks assessed/performed   PT - Cognitive impairments: No apparent impairments, No family/caregiver present to determine baseline                         Following commands: Intact       Cueing       General Comments General comments (skin integrity, edema, etc.): See Orthostatic VS in Vital signs    Exercises     Assessment/Plan    PT Assessment Patient needs continued PT services  PT Problem List Decreased activity tolerance;Decreased balance;Decreased mobility;Decreased knowledge of use of DME;Decreased safety awareness;Decreased knowledge of precautions;Cardiopulmonary status limiting activity       PT Treatment Interventions DME instruction;Gait training;Functional mobility training;Therapeutic activities;Therapeutic exercise;Balance training;Neuromuscular re-education;Patient/family education    PT Goals (Current goals can be found in the Care Plan section)  Acute Rehab PT Goals Patient Stated Goal: to go home PT Goal Formulation: With patient Time For Goal Achievement: 02/02/24 Potential to Achieve Goals: Good    Frequency Min 2X/week     Co-evaluation PT/OT/SLP Co-Evaluation/Treatment: Yes Reason for Co-Treatment: Complexity of the patient's impairments (multi-system involvement);For patient/therapist safety PT goals addressed during session: Mobility/safety with mobility         AM-PAC PT 6 Clicks Mobility  Outcome Measure Help needed turning from your back to your side while in a flat bed without using bedrails?: A Lot Help needed moving from lying on your back to sitting on the side of a flat bed without using bedrails?: A Lot Help needed moving to and from a bed to a chair (including a wheelchair)?: A  Little Help needed standing up from a chair using your arms (e.g., wheelchair or bedside chair)?: Total Help needed to walk in hospital room?: Total Help needed climbing 3-5 steps with a railing? : Total 6 Click Score: 10    End of Session Equipment Utilized During Treatment: Gait belt Activity Tolerance: Patient limited by fatigue Patient left:  (on stretcher) Nurse Communication: Mobility status PT Visit Diagnosis: Unsteadiness on feet (R26.81);Muscle weakness (generalized) (M62.81)    Time: 9082-9045 PT Time Calculation (min) (ACUTE ONLY): 37 min   Charges:   PT Evaluation $PT Eval Moderate Complexity: 1 Mod   PT General Charges $$ ACUTE PT VISIT: 1 Visit         Giulliana Mcroberts M,PT Acute Rehab Services [phone]   Stephane JULIANNA Bevel 01/19/2024, 1:08 PM

## 2024-01-19 NOTE — Plan of Care (Signed)

## 2024-01-19 NOTE — Progress Notes (Signed)
  Progress Note   Patient: John Walton FMW:[id number] DOB: [birth year] DOA: 01/17/2024     0 DOS: the patient was seen and examined on 01/19/2024 at 12:05PM      Brief hospital course: 89 y.o. M with CKD IV baseline 2-2.5, CAD/PVD, HTN, DM, stroke, mild left sided weakness, hyperthyroidism on methimazole , hx right CEA, HLD, aortic stenosis and severe dysphagia due to cervical spine osteophytes requiring PEG tube who presented with syncope.  To me, wife says he was essentially at his baseline until the day of admission, he was sitting at the table with [facility] aide when he slumped over and passed out.  EMS was activated, and when they arrived, he passed out again and vomited.    In the ER, WBC normal, Hgb 9.8 stable relative to baseline.  Cr at baseline.  Electrolytes unremarkable, ECG normal rhythm.    CXR showed possible patchy opacity, CTH unchanged.  Given antibiotics and hospitalists asked to evaluate for syncope.     BP 133/65   Pulse 93   Temp 97.8 F (36.6 C) (Oral)   Resp (!) 26   Ht 5' 6 (1.676 m)   Wt 70 kg   SpO2 99%   BMI 24.91 kg/m   Elderly chronically ill appearing adult male Bilateral conjunctivitis, OP dry, no oral lesions.   Tachycardic, regular, loud systolic murmur, no JVD or edema Respiratory rate normal, lungs clear without rales or wheezing. Abdomen soft no tenderness palpation or guarding, no ascites or distention Very hard of hearing, and seems to have short-term memory impairment.  Face symmetric, upper extremity strength weak but symmetric.    Syncope Telemetry overnight normal.  No orthostasis.  Hemoglobin close to baseline.  Electrolytes normal.  Maybe there is an aspiration pneumonia on chest x-ray, although this is asymptomatic.  More likely this is symptomatic aortic stenosis.  I spoke with the patient's cardiologist today, and together we discussed with the patient and his wife aortic valve repair and they declined. - Consult palliative  care  Possible aspiration pneumonia -Continue Unasyn, day 3 of 5  Aortic stenosis See above.  Echocardiogram today confirmed severe aortic stenosis.  Severe dysphagia -Consult speech - Thickened liquids - Regular consistency food, discussed risk and benefit with speech therapy and wife  Chronic kidney disease stage IV Cr at baseline  Hyperthyroidism TSH low, free T4 normal. - Continue methimazole  - Follow T3   Coronary artery disease Peripheral vascular disease Hypertension Cerebrovascular disease - Continue Lipitor - Continue aspirin   Diabetes  CBG (last 3)  Glucose normal here - SSI            Subjective: No further passing out today.  Patient had an echo.  He was able to walk with nursing.     Data Reviewed: Discussed with cardiology Basic metabolic panel shows hyponatremia, stable, renal function stable CBC shows mild anemia, no leukocytosis    Family Communication: Wife at the bedside              Author: Lonni SHAUNNA Dalton, MD 01/19/2024 6:14 PM  For on call review [email or web address].

## 2024-01-19 NOTE — Progress Notes (Signed)
 SLP Cancellation Note  Patient Details Name: [name] MRN: [id number] DOB: 06-16-1932   Cancelled treatment:       Reason Eval/Treat Not Completed: Patient at procedure or test/unavailable; pt OTF with echo; will continue efforts as schedule permits.     Pat Alivya Wegman,M.S.,CCC-SLP 01/19/2024, 10:46 AM

## 2024-01-19 NOTE — ED Notes (Signed)
 Physical Therapy at bedside.

## 2024-01-19 NOTE — Progress Notes (Signed)
 Echocardiogram 2D Echocardiogram has been performed.  Damien FALCON Kamylah Manzo RDCS 01/19/2024, 10:37 AM

## 2024-01-20 ENCOUNTER — Other Ambulatory Visit (HOSPITAL_COMMUNITY): Payer: Self-pay

## 2024-01-20 DIAGNOSIS — E0590 Thyrotoxicosis, unspecified without thyrotoxic crisis or storm: Secondary | ICD-10-CM | POA: Diagnosis present

## 2024-01-20 DIAGNOSIS — I2489 Other forms of acute ischemic heart disease: Secondary | ICD-10-CM | POA: Diagnosis present

## 2024-01-20 DIAGNOSIS — J690 Pneumonitis due to inhalation of food and vomit: Secondary | ICD-10-CM | POA: Diagnosis present

## 2024-01-20 DIAGNOSIS — H109 Unspecified conjunctivitis: Secondary | ICD-10-CM | POA: Diagnosis present

## 2024-01-20 DIAGNOSIS — Z833 Family history of diabetes mellitus: Secondary | ICD-10-CM | POA: Diagnosis not present

## 2024-01-20 DIAGNOSIS — E1122 Type 2 diabetes mellitus with diabetic chronic kidney disease: Secondary | ICD-10-CM | POA: Diagnosis present

## 2024-01-20 DIAGNOSIS — F1021 Alcohol dependence, in remission: Secondary | ICD-10-CM | POA: Diagnosis present

## 2024-01-20 DIAGNOSIS — Z1152 Encounter for screening for COVID-19: Secondary | ICD-10-CM | POA: Diagnosis not present

## 2024-01-20 DIAGNOSIS — Z87891 Personal history of nicotine dependence: Secondary | ICD-10-CM | POA: Diagnosis not present

## 2024-01-20 DIAGNOSIS — Z7189 Other specified counseling: Secondary | ICD-10-CM | POA: Diagnosis not present

## 2024-01-20 DIAGNOSIS — Z7982 Long term (current) use of aspirin: Secondary | ICD-10-CM | POA: Diagnosis not present

## 2024-01-20 DIAGNOSIS — I350 Nonrheumatic aortic (valve) stenosis: Secondary | ICD-10-CM | POA: Diagnosis present

## 2024-01-20 DIAGNOSIS — E440 Moderate protein-calorie malnutrition: Secondary | ICD-10-CM | POA: Diagnosis present

## 2024-01-20 DIAGNOSIS — Z515 Encounter for palliative care: Secondary | ICD-10-CM | POA: Diagnosis not present

## 2024-01-20 DIAGNOSIS — Z96642 Presence of left artificial hip joint: Secondary | ICD-10-CM | POA: Diagnosis present

## 2024-01-20 DIAGNOSIS — E785 Hyperlipidemia, unspecified: Secondary | ICD-10-CM | POA: Diagnosis present

## 2024-01-20 DIAGNOSIS — R55 Syncope and collapse: Secondary | ICD-10-CM | POA: Diagnosis not present

## 2024-01-20 DIAGNOSIS — E1151 Type 2 diabetes mellitus with diabetic peripheral angiopathy without gangrene: Secondary | ICD-10-CM | POA: Diagnosis present

## 2024-01-20 DIAGNOSIS — N1330 Unspecified hydronephrosis: Secondary | ICD-10-CM | POA: Diagnosis present

## 2024-01-20 DIAGNOSIS — E871 Hypo-osmolality and hyponatremia: Secondary | ICD-10-CM | POA: Diagnosis present

## 2024-01-20 DIAGNOSIS — I129 Hypertensive chronic kidney disease with stage 1 through stage 4 chronic kidney disease, or unspecified chronic kidney disease: Secondary | ICD-10-CM | POA: Diagnosis present

## 2024-01-20 DIAGNOSIS — F0150 Vascular dementia without behavioral disturbance: Secondary | ICD-10-CM | POA: Diagnosis present

## 2024-01-20 DIAGNOSIS — Z931 Gastrostomy status: Secondary | ICD-10-CM | POA: Diagnosis not present

## 2024-01-20 DIAGNOSIS — I2510 Atherosclerotic heart disease of native coronary artery without angina pectoris: Secondary | ICD-10-CM | POA: Diagnosis present

## 2024-01-20 DIAGNOSIS — E039 Hypothyroidism, unspecified: Secondary | ICD-10-CM | POA: Diagnosis present

## 2024-01-20 DIAGNOSIS — N184 Chronic kidney disease, stage 4 (severe): Secondary | ICD-10-CM | POA: Diagnosis present

## 2024-01-20 LAB — GLUCOSE, CAPILLARY
Glucose-Capillary: 127 mg/dL — ABNORMAL HIGH (ref 70–99)
Glucose-Capillary: 184 mg/dL — ABNORMAL HIGH (ref 70–99)
Glucose-Capillary: 230 mg/dL — ABNORMAL HIGH (ref 70–99)

## 2024-01-20 MED ORDER — ATORVASTATIN CALCIUM 40 MG PO TABS: 40.0000 mg | ORAL_TABLET | Freq: Every day | ORAL | Status: DC

## 2024-01-20 MED ORDER — AMOXICILLIN-POT CLAVULANATE 400-57 MG/5ML PO SUSR
800.0000 mg | Freq: Two times a day (BID) | ORAL | 0 refills | Status: AC
  Filled 2024-01-20: qty 100, 5d supply, fill #0

## 2024-01-20 NOTE — Progress Notes (Signed)
 Discharge Nurse Summary: DC order noted per MD. DC RN at bedside with patient. Patient agreeable with discharge plan, family at the bedside. AVS printed/reviewed. PIV removed, skin intact. No DME needs. No home meds. TOC meds pending pickup. CP/Edu resolved. Telemonitor not present on assessment. All belongings accounted for. PEG tube insertion site CDI, no s/s bleeding, infection, or drainage. Patient wheeled downstairs for discharge by private auto. TOC meds picked up on the way out.   Rosario EMERSON Lund, RN

## 2024-01-20 NOTE — Discharge Summary (Signed)
 Physician Discharge Summary   Patient: John Walton MRN: [id number] DOB: [birth year]  Admit date:     01/17/2024  Discharge date: 01/20/24  Discharge Physician: Lonni SHAUNNA Dalton   PCP: Waymond Ryan Askew, MD     Recommendations at discharge:  Follow up with outpatient Palliative Care via [facility] Follow up with Cardiology Dr. Tan as needed     Discharge Diagnoses: Principal Problem:   Syncope and collapse Active Problems:   Aspiration pneumonia (HCC)   Malnutrition of moderate degree   Severe aortic stenosis   Dysphagia   Chronic kidney disease stage IV     Hypothyroidism  Coronary artery disease   Peripheral vascular disease   Hypertension   Cerebrovascular disease   Diabetes      Hospital Course: 89 y.o. M with CKD IV baseline 2-2.5, CAD/PVD, HTN, DM, stroke, mild left sided weakness, hyperthyroidism on methimazole , hx right CEA, HLD, aortic stenosis and severe dysphagia due to cervical spine osteophytes requiring PEG tube who presented with syncope.  To me, wife says he was essentially at his baseline until the day of admission, he was sitting at the table with [facility] aide when he slumped over and passed out.  EMS was activated, and when they arrived, he passed out again and vomited.    In the ER, WBC normal, Hgb 9.8 stable relative to baseline.  Cr at baseline.  Electrolytes unremarkable, ECG normal rhythm.    CXR showed possible patchy opacity, CTH unchanged.  Given antibiotics and hospitalists asked to evaluate for syncope.         Syncope EKG normal.  Telemetry monitored for 48 hours normal.  Electrolytes normal and renal function at baseline.  Hemoglobin at baseline.  No further passing out in the hospital.  After having a good sleep and a good meal, he returned to his physical baseline.  In the best case scenario, the syncope was due to #2 below, and will not recur after treatment.  In a worst-case area, it was related to #3 below, which  will continue to progress, have a high burden of symptoms, and require transition to Hospice in the coming months.     Possible aspiration pneumonia Chest x-ray showed patchy infiltrates.  Suspect he had some aspiration pneumonia.  He was treated with Unasyn and improved.  Discharged to complete 5 days with Augmentin. 2.  Aortic stenosis Echocardiogram here showed a low-flow, low gradient aortic stenosis.  This was discussed by phone with his personal cardiologist, and the patient articulated clearly that he does not wish to undergo TAVR.  Palliative care was consulted, and she has a clear right understanding of a possible prognosis if this is truly end-stage aortic stenosis and progressing.  She was connected with outpatient palliative services.    Severe dysphagia Speech therapy were consulted while he was in the hospital, they are familiar with the patient from previous visits.  He appears to be swallowing at his baseline, and will continue the diet as agreed upon with his wife, consistent with her goals of care.  Chronic kidney disease stage IV Creatinine stable relative to baseline 2-2.5.  Hyperthyroidism TSH low, free T4 normal.  Stable on methimazole .  No symptoms of hyperthyroidism.  Coronary artery disease Peripheral vascular disease Hypertension Cerebrovascular disease Stable on aspirin  and Lipitor  Diabetes Glucose normal         The [place]  Controlled Substances Registry was reviewed for this patient prior to discharge.   Consultants: Palilative care  Procedures performed: Echo  Disposition: Home health Diet recommendation:  Discharge Diet Orders (From admission, onward)     Start     Ordered   01/20/24 0000  Diet - low sodium heart healthy        01/20/24 1552             DISCHARGE MEDICATION: Allergies as of 01/20/2024       Reactions   Other Swelling, Other (See Comments)   ALL COUGH MEDICATIONS   Influenza Vaccines Other (See  Comments)   Unknown   Robitussin Cold Cough+ Chest [dextromethorphan -guaifenesin ] Swelling, Other (See Comments)   Laryngeal edema - any cough syrup; Wife states Robitussin and ANY cough syrup closes patient's throat        Medication List     TAKE these medications    acetaminophen  500 MG tablet Commonly known as: TYLENOL  Take 500 mg by mouth as needed for pain.   amoxicillin-clavulanate 400-57 MG/5ML suspension Commonly known as: AUGMENTIN Take 10 mLs (800 mg total) by mouth 2 (two) times daily for 5 doses, then discard remainder   aspirin  EC 81 MG tablet Take 1 tablet (81 mg total) by mouth daily.   atorvastatin  40 MG tablet Commonly known as: LIPITOR Take 40 mg by mouth at bedtime.   carboxymethylcellulose 0.5 % Soln Commonly known as: REFRESH PLUS Place 1 drop into both eyes every hour while awake.   cyanocobalamin  500 MCG tablet Commonly known as: VITAMIN B12 Take 1 tablet by mouth 2 (two) times daily.   ferrous sulfate  325 (65 FE) MG tablet Take 1 tablet (325 mg total) by mouth daily with breakfast.   free water  Soln Place 120 mLs into feeding tube 5 (five) times daily.   latanoprost  0.005 % ophthalmic solution Commonly known as: XALATAN  Place 1 drop into both eyes at bedtime.   loperamide  2 MG capsule Commonly known as: IMODIUM  Take 2 mg by mouth as needed for diarrhea or loose stools.   methimazole  5 MG tablet Commonly known as: TAPAZOLE  Take 5 mg by mouth daily.   mupirocin  ointment 2 % Commonly known as: BACTROBAN  Place 1 Application into the nose 2 (two) times daily. What changed: how to take this   NORCO PO Take 1 tablet by mouth as needed (pain).   Oyster Shell Calcium  w/D 500-5 MG-MCG Tabs Take 1 tablet by mouth in the morning and at bedtime.   pantoprazole  40 MG tablet Commonly known as: PROTONIX  Take 40 mg by mouth as needed for heartburn.   prednisoLONE  acetate 1 % ophthalmic suspension Commonly known as: PRED FORTE  Place 1 drop  into the left eye daily.   ProSource Liqd Give 45 mLs by tube in the morning and at bedtime.   feeding supplement (OSMOLITE 1.5 CAL) Liqd Place 237 mLs into feeding tube continuous.   Simbrinza 1-0.2 % Susp Generic drug: Brinzolamide -Brimonidine  Place 1 drop into both eyes in the morning and at bedtime.   Tears Again Oint Place 1 application  into both eyes in the morning and at bedtime. Refresh PM   Varenicline  Tartrate 0.03 MG/ACT Soln Place 1 spray into both nostrils 2 (two) times daily.   Xiidra  5 % Soln Generic drug: Lifitegrast  Place 1 drop into both eyes in the morning and at bedtime.        Contact information for follow-up providers     Tan, Ryan Ang, MD Follow up.   Specialty: Cardiology Contact information: [address] [place] [address] [phone]  Contact information for after-discharge care     Home Medical Care     [facility] [place] [facility]) .   Service: Home Health Services Contact information: [address] [place]  [phone]                     Discharge Instructions     Diet - low sodium heart healthy   Complete by: As directed    Discharge instructions   Complete by: As directed    **IMPORTANT DISCHARGE INSTRUCTIONS**   From Dr. Jonel: You were admitted for a passing out spell.  Here, we found that your blood counts, your electrolytes, your kidney function were all normal.  We found that the electrical activity of your heart was normal.  We did a chest x-ray and this showed some patchy areas that look like a pneumonia, which is odd because you had no other symptoms to suggest it.  That said, a best case scenario is that this whole episode (passing out, etc) was due to a pneumonia, which we can treat with antibiotics You were treated here with antibiotics, take Augmentin for 2 more days starting tonight Take Augmentin 800-114  mg (10 mL) liquid through the PEG twice daily starting tonight  A worst case scenario is that this passing out spell was evidence of worsening aortic valve disease.  As we discussed with Dr. Waymond on the phone, the TAVR risk is very high and we respect your wish to forego this.  I recommend that you connect with an outpatietn Palliative Care provider through [facility] and continue discussions with them of how things are going.  Resume your other home medicines In a best   Increase activity slowly   Complete by: As directed        Discharge Exam: Filed Weights   01/17/24 1915 01/19/24 1533  Weight: 70 kg 71.1 kg    General: Pt is alert, awake, not in acute distress Cardiovascular: RRR, nl S1-S2, low volume SEM appreciated.   No LE edema.   Respiratory: Normal respiratory rate and rhythm.  CTAB without rales or wheezes. Abdominal: Abdomen soft and non-tender.  No distension or HSM.   Neuro/Psych: Strength symmetric in upper and lower extremities.  Judgment and insight appear normal.   Condition at discharge: good  The results of significant diagnostics from this hospitalization (including imaging, microbiology, ancillary and laboratory) are listed below for reference.   Imaging Studies: ECHOCARDIOGRAM COMPLETE Result Date: 01/19/2024    ECHOCARDIOGRAM REPORT   Patient Name:   John Walton Date of Exam: 01/19/2024 Medical Rec #:  [id number]       Height:       66.0 in Accession #:    [id number]      Weight:       154.3 lb Date of Birth:  1932-10-27       BSA:          1.791 m Patient Age:    89 years        BP:           174/79 mmHg Patient Gender: M               HR:           89 bpm. Exam Location:  Inpatient Procedure: 2D Echo, Cardiac Doppler and Color Doppler (Both Spectral and Color            Flow Doppler were utilized  during procedure). Indications:    Aortic Stenosis i35.0  History:        Patient has prior history of Echocardiogram examinations, most                 recent  12/20/2022. Arrythmias:RBBB; Risk Factors:Hypertension,                 Dyslipidemia and Diabetes.  Sonographer:    Damien Senior RDCS Referring Phys: [id number] ALLISON L ELLIS IMPRESSIONS  1. Left ventricular ejection fraction, by estimation, is 40 to 45%. The left ventricle has mildly decreased function. The left ventricle demonstrates regional wall motion abnormalities (see scoring diagram/findings for description). Left ventricular diastolic parameters are indeterminate.  2. Right ventricular systolic function is low normal. The right ventricular size is normal. There is mildly elevated pulmonary artery systolic pressure. The estimated right ventricular systolic pressure is 37.2 mmHg.  3. Left atrial size was mildly dilated.  4. Right atrial size was mildly dilated.  5. The mitral valve is degenerative. Trivial mitral valve regurgitation. Mild to moderate mitral stenosis. The mean mitral valve gradient is 6.0 mmHg with average heart rate of 85 bpm. Moderate mitral annular calcification.  6. Aortic valve area by VTI is 0.78cm^2 and dimensionless index is 0.21, Stroke volume index 27, consistent with low-flow low-gradient severe aortic stenosis. The aortic valve is calcified. Aortic valve regurgitation is mild. Aortic valve area, by VTI measures 0.78 cm. Aortic valve mean gradient measures 22.0 mmHg. Aortic valve Vmax measures 3.08 m/s.  7. The inferior vena cava is normal in size with <50% respiratory variability, suggesting right atrial pressure of 8 mmHg. Comparison(s): A prior study was performed on 12/19/2020. The ejection fraction was 55-60% with similar wall motion abnormalities. There was mild to moderate aortic stenosis and no mitral stenosis. The aortic root was 42 mm and ascending aorta was 41 mm. FINDINGS  Left Ventricle: Left ventricular ejection fraction, by estimation, is 40 to 45%. The left ventricle has mildly decreased function. The left ventricle demonstrates regional wall motion abnormalities. The  left ventricular internal cavity size was normal in size. There is no left ventricular hypertrophy. Left ventricular diastolic function could not be evaluated due to mitral stenosis. Left ventricular diastolic parameters are indeterminate.  LV Wall Scoring: The apical inferior segment is hypokinetic. Right Ventricle: The right ventricular size is normal. Right vetricular wall thickness was not well visualized. Right ventricular systolic function is low normal. There is mildly elevated pulmonary artery systolic pressure. The tricuspid regurgitant velocity is 2.70 m/s, and with an assumed right atrial pressure of 8 mmHg, the estimated right ventricular systolic pressure is 37.2 mmHg. Left Atrium: Left atrial size was mildly dilated. Right Atrium: Right atrial size was mildly dilated. Pericardium: Trivial pericardial effusion is present. Mitral Valve: The mitral valve is degenerative in appearance. There is moderate calcification of the mitral valve leaflet(s). Moderate mitral annular calcification. Trivial mitral valve regurgitation. Mild to moderate mitral valve stenosis. MV peak gradient, 11.7 mmHg. The mean mitral valve gradient is 6.0 mmHg with average heart rate of 85 bpm. Tricuspid Valve: The tricuspid valve is normal in structure. Tricuspid valve regurgitation is mild. Aortic Valve: Aortic valve area by VTI is 0.78cm^2 and dimensionless index is 0.21, Stroke volume index 27, consistent with low-flow low-gradient severe aortic stenosis. The aortic valve is calcified. Aortic valve regurgitation is mild. Aortic valve mean  gradient measures 22.0 mmHg. Aortic valve peak gradient measures 37.9 mmHg. Aortic valve area, by VTI measures 0.78 cm. Pulmonic Valve:  The pulmonic valve was grossly normal. Pulmonic valve regurgitation is mild. Aorta: The aortic root and ascending aorta are structurally normal, with no evidence of dilitation. Venous: The inferior vena cava is normal in size with less than 50% respiratory  variability, suggesting right atrial pressure of 8 mmHg. IAS/Shunts: No atrial level shunt detected by color flow Doppler.  LEFT VENTRICLE PLAX 2D LVIDd:         4.40 cm LVIDs:         3.70 cm LV PW:         0.90 cm LV IVS:        0.90 cm LVOT diam:     2.30 cm LV SV:         49 LV SV Index:   27 LVOT Area:     4.15 cm  LV Volumes (MOD) LV vol d, MOD A2C: 75.8 ml LV vol d, MOD A4C: 86.7 ml LV vol s, MOD A2C: 48.9 ml LV vol s, MOD A4C: 57.7 ml LV SV MOD A2C:     26.9 ml LV SV MOD A4C:     86.7 ml LV SV MOD BP:      27.2 ml RIGHT VENTRICLE RV S prime:     10.70 cm/s TAPSE (M-mode): 1.7 cm LEFT ATRIUM             Index        RIGHT ATRIUM           Index LA diam:        3.10 cm 1.73 cm/m   RA Area:     16.40 cm LA Vol (A2C):   50.1 ml 27.97 ml/m  RA Volume:   42.20 ml  23.56 ml/m LA Vol (A4C):   51.5 ml 28.75 ml/m LA Biplane Vol: 52.7 ml 29.42 ml/m  AORTIC VALVE AV Area (Vmax):    0.88 cm AV Area (Vmean):   0.85 cm AV Area (VTI):     0.78 cm AV Vmax:           308.00 cm/s AV Vmean:          222.000 cm/s AV VTI:            0.627 m AV Peak Grad:      37.9 mmHg AV Mean Grad:      22.0 mmHg LVOT Vmax:         65.30 cm/s LVOT Vmean:        45.400 cm/s LVOT VTI:          0.118 m LVOT/AV VTI ratio: 0.19  AORTA Ao Root diam: 3.60 cm Ao Asc diam:  3.30 cm MITRAL VALVE              TRICUSPID VALVE MV Area VTI:  1.85 cm    TR Peak grad:   29.2 mmHg MV Peak grad: 11.7 mmHg   TR Vmax:        270.00 cm/s MV Mean grad: 6.0 mmHg MV Vmax:      1.71 m/s    SHUNTS MV Vmean:     111.7 cm/s  Systemic VTI:  0.12 m                           Systemic Diam: 2.30 cm Emeline Calender Electronically signed by Emeline Calender Signature Date/Time: 01/19/2024/3:20:59 PM    Final    CT Head Wo Contrast Result Date: 01/17/2024 EXAM: CT HEAD WITHOUT CONTRAST 01/17/2024 08:46:41  PM TECHNIQUE: CT of the head was performed without the administration of intravenous contrast. Automated exposure control, iterative reconstruction, and/or weight based  adjustment of the mA/kV was utilized to reduce the radiation dose to as low as reasonably achievable. COMPARISON: Comparison is made with prior CT from 06/16/2023. CLINICAL HISTORY: loc. Syncopal episode loc. Syncopal episode FINDINGS: BRAIN AND VENTRICLES: No acute hemorrhage. No evidence of acute infarct. Generalized age-related cerebral atrophy with moderate chronic microvascular ischemic disease. Chronic right frontal and right occipital infarcts noted. Remote lacunar infarct at the right basal ganglia. No hydrocephalus. No extra-axial collection. No mass effect or midline shift. ORBITS: No acute abnormality. SINUSES: No acute abnormality. SOFT TISSUES AND SKULL: No acute soft tissue abnormality. No skull fracture. Calcified atherosclerosis present about the skull base. IMPRESSION: 1. No acute intracranial abnormality. 2. Generalized age-related cerebral atrophy with moderate chronic microvascular ischemic disease. 3. Chronic right frontal and right occipital infarcts, and a remote lacunar infarct at the right basal ganglia. Electronically signed by: Morene Hoard MD 01/17/2024 08:58 PM EDT RP Workstation: [id number]   DG Chest 2 View Result Date: 01/17/2024 EXAM: 2 VIEW(S) XRAY OF THE CHEST 06/18/2023 COMPARISON: None available. CLINICAL HISTORY: aspiration. SHOB with coughing and vomiting. Diarrhea yesterday. Syncopal episode with [employer] on a chair FINDINGS: LUNGS AND PLEURA: There is low inspiration. Asymmetric patchy consolidation intermixed with linear atelectasis is seen in the left lower lobe base probably a combination of atelectasis, pneumonia, and/or aspiration. There is also a small left pleural effusion. The remaining lungs are clear. No pulmonary edema. No pneumothorax. HEART AND MEDIASTINUM: The cardiomediastinal silhouette and vascular pattern are normal. There is calcification of the transverse aorta. BONES AND SOFT TISSUES: There is bilateral acromiohumeral abutment consistent with  chronic rotator cuff arthropathy. No new osseous findings. Extensive thoracic spine bridging enthesopathy. No acute osseous abnormality. IMPRESSION: 1. Asymmetric patchy consolidation and linear atelectasis in the left lower lobe base, possibly representing a combination of atelectasis, pneumonia, and/or aspiration. 2. Small left pleural effusion. 3. Follow-up study recommended to ensure clearing. Electronically signed by: Francis Quam MD 01/17/2024 08:28 PM EDT RP Workstation: [id number]   DG SWALLOW FUNC OP MEDICARE SPEECH PATH Result Date: 01/12/2024 Table formatting from the original result was not included. Modified Barium Swallow Study Patient Details Name: TORIN MODICA MRN: [id number] Date of Birth: Apr 04, 1932 Today's Date: 01/12/2024 HPI/PMH: HPI: Shandy Vi is an 89 y.o. male referred for OP MBS.  Pt known to this clinician from 07/18/23 MBS at [facility]. His wife reports more frequent cough/throat-clearing during meals and at night. He has a PMHx of DISH, right carotid artery stenosis, hypertension, diabetes, hyperlipidemia, CKD stage IIIb-IV, CVA, BPH status post TURP, hyperthyroidism, PAD, CAD, aortic stenosis, PEG for hydration. CT cervical spine 02/12/23:  Large flowing anterior osteophytes throughout the cervical spine and upper thoracic spine. Jan 2022 CT head/ceck demonstrated DISH with large osteophyte at C5-C6 causing mass-effect on the esophagus.  Same finding noted on imaging in [place]  in 2011. MBS 04/03/20 - impaired epiglottic closure secondary to DISH and deconditioning, decreased laryngeal elevation, pharyngeal retention - nectar and thin liquids recommended. MBS 07/18/23 - stable swallowing since 2022 study. Clinical Impression: Clinical Impression: Pt presents with pharyngeal dysphagia that is relatively consistent with prior studies. The primary obstacle to both safety and efficiency of swallowing remains DISH, large flowing anterior osteophytes along the cervical vertebrae.  These  create a narrowing of the pharyngeal space and impact the ability of the epiglottis to invert over the larynx to  help protect against aspiration.  Impaired inversion leads to residue that sits on top of the epiglottis (valleculae) and cannot clear, as well as reduced laryngeal closure, allowing intermittent penetration of thin liquids.  As in most recent study, the quantity of material that entered the larynx was trace.  Over time, penetrated thin liquid eventually reached the vocal folds, eliciting a spontaneous throat-clearing. At one point, a speck of barium reached below the vocal folds (aspiration).  Doubt that the pt is aspirating large volumes. His wife reports no pna since last seen by SLP in April.  Study was reviewed with pt, his wife and son - we reviewed video and discussed the benefit of continuing to eat and drink the foods/liquids that Mr. Fortson enjoys.  Given that there have been no health consequences of his dysphagia, there is no reason to modify diet.  We discussed crushing pills and/or delivering via G-tube to avoid getting stuck in the valleculae. Pt/family agree with plan. No further recommendations; no SLP therapy is needed. Factors that may increase risk of adverse event in presence of aspiration [name] & [name] 2021): Factors that may increase risk of adverse event in presence of aspiration [name] & [name] 2021): Frail or deconditioned; Limited mobility Recommendations/Plan: Swallowing Evaluation Recommendations Swallowing Evaluation Recommendations Recommendations: PO diet PO Diet Recommendation: Regular; Thin liquids (Level 0) Liquid Administration via: Cup; Straw Medication Administration: Via alternative means Supervision: Patient able to self-feed Treatment Plan Treatment Plan Treatment recommendations: No treatment recommended at this time Follow-up recommendations: No SLP follow up Recommendations Recommendations for follow up therapy are one component of a multi-disciplinary  discharge planning process, led by the attending physician.  Recommendations may be updated based on patient status, additional functional criteria and insurance authorization. Assessment: Orofacial Exam: Orofacial Exam Oral Cavity: Oral Hygiene: WFL Oral Cavity - Dentition: Adequate natural dentition; Missing dentition Orofacial Anatomy: WFL Oral Motor/Sensory Function: WFL Anatomy: Anatomy: Suspected cervical osteophytes (dx of DISH) Boluses Administered: Boluses Administered Boluses Administered: Thin liquids (Level 0); Mildly thick liquids (Level 2, nectar thick); Moderately thick liquids (Level 3, honey thick); Puree; Solid  Oral Impairment Domain: Oral Impairment Domain Lip Closure: No labial escape Tongue control during bolus hold: Cohesive bolus between tongue to palatal seal Bolus preparation/mastication: Slow prolonged chewing/mashing with complete recollection Bolus transport/lingual motion: Brisk tongue motion Oral residue: Complete oral clearance Location of oral residue : N/A Initiation of pharyngeal swallow : Pyriform sinuses  Pharyngeal Impairment Domain: Pharyngeal Impairment Domain Soft palate elevation: No bolus between soft palate (SP)/pharyngeal wall (PW) Laryngeal elevation: Complete superior movement of thyroid  cartilage with complete approximation of arytenoids to epiglottic petiole Anterior hyoid excursion: Complete anterior movement Epiglottic movement: Partial inversion Laryngeal vestibule closure: Incomplete, narrow column air/contrast in laryngeal vestibule Pharyngeal stripping wave : Present - complete Pharyngeal contraction (A/P view only): N/A Pharyngoesophageal segment opening: Partial distention/partial duration, partial obstruction of flow Tongue base retraction: No contrast between tongue base and posterior pharyngeal wall (PPW) Pharyngeal residue: Collection of residue within or on pharyngeal structures Location of pharyngeal residue: Valleculae; Pyriform sinuses  Esophageal  Impairment Domain: Esophageal Impairment Domain Esophageal clearance upright position: Complete clearance, esophageal coating Pill: Pill Consistency administered: -- (NT) Penetration/Aspiration Scale Score: Penetration/Aspiration Scale Score 1.  Material does not enter airway: Moderately thick liquids (Level 3, honey thick); Puree; Solid; Mildly thick liquids (Level 2, nectar thick) 6.  Material enters airway, passes BELOW cords then ejected out: Thin liquids (Level 0) Compensatory Strategies: No data recorded  General Information: Caregiver present: Yes  Diet Prior to this Study: Regular; Thin liquids (Level 0)   No data recorded  Respiratory Status: WFL   Supplemental O2: None (Room air)   History of Recent Intubation: No  Behavior/Cognition: Alert; Cooperative; Pleasant mood Self-Feeding Abilities: Able to self-feed Baseline vocal quality/speech: Normal Volitional Cough: Able to elicit Volitional Swallow: Able to elicit Exam Limitations: No limitations Goal Planning: No data recorded No data recorded No data recorded No data recorded No data recorded Pain: Pain Assessment Pain Assessment: No/denies pain End of Session: Start Time:SLP Start Time (ACUTE ONLY): 1125 Stop Time: SLP Stop Time (ACUTE ONLY): 1156 Time Calculation:SLP Time Calculation (min) (ACUTE ONLY): 31 min Charges: SLP Evaluations $ SLP Speech Visit: 1 Visit SLP Evaluations $Outpatient MBS Swallow: 1 Procedure SLP visit diagnosis: SLP Visit Diagnosis: Dysphagia, pharyngeal phase (R13.13) Past Medical History: Past Medical History: Diagnosis Date  Anemia   Carotid artery occlusion   Chicken pox   Chronic kidney disease   Coronary artery disease   Diabetes mellitus without complication (HCC)   Frequent headaches   Glaucoma   Hay fever   History of blood transfusion   HTN (hypertension)   Hyperlipidemia   Hyperlipidemia   Hypothyroidism   Migraines   Peripheral vascular disease   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[facility])  Past Surgical History: Past  Surgical History: Procedure Laterality Date  CHOLECYSTECTOMY, LAPAROSCOPIC    ENDARTERECTOMY Right 12/22/2020  Procedure: RIGHT CAROTID ENDARTERECTOMY;  Surgeon: Eliza Lonni RAMAN, MD;  Location: [facility] OR;  Service: Vascular;  Laterality: Right;  IR GASTROSTOMY TUBE MOD SED  03/29/2020  TONSILLECTOMY  04/01/1940  TOTAL HIP ARTHROPLASTY Left 03/08/2020  Procedure: TOTAL HIP ARTHROPLASTY;  Surgeon: Barbarann Oneil BROCKS, MD;  Location: [facility] ORS;  Service: Orthopedics;  Laterality: Left;  TRANSURETHRAL RESECTION OF PROSTATE N/A 12/14/2020  Procedure: TRANSURETHRAL RESECTION OF THE PROSTATE (TURP);  Surgeon: Matilda Senior, MD;  Location: [facility] ORS;  Service: Urology;  Laterality: N/A;  2 HRS Couture, Alan Bradford 01/12/2024, 4:14 PM Amanda L. [name], MA CCC/SLP Clinical Specialist - Acute Care SLP Acute Rehabilitation Services Office number [phone] CLINICAL DATA:  89 year old male with coughing with meals, DISH. Evaluation for diet tolerance. EXAM: MODIFIED BARIUM SWALLOW TECHNIQUE: Different consistencies of barium were administered orally to the patient by the Speech Pathologist. Imaging of the pharynx was performed in the lateral projection. Kacie Matthews PA-C was present in the fluoroscopy room during this study, which was supervised and interpreted by Newell Eke, MD. Radiologist, not in attendance for the exam. FLUOROSCOPY: Radiation Exposure Index (as provided by the fluoroscopic device): 17.29 mGy Kerma COMPARISON:  None Available. FINDINGS: Vestibular Penetration observed with thin liquids. Eventual aspiration of residue triggering cough. IMPRESSION: Please refer to the Speech Pathologists report for complete details and recommendations. Electronically Signed   By: Newell Eke M.D.   On: 01/12/2024 15:19   Microbiology: Results for orders placed or performed during the hospital encounter of 06/17/23  Resp panel by RT-PCR (RSV, Flu A&B, Covid) Anterior Nasal Swab     Status: None   Collection Time:  06/17/23  6:28 PM   Specimen: Anterior Nasal Swab  Result Value Ref Range Status   SARS Coronavirus 2 by RT PCR NEGATIVE NEGATIVE Final    Comment: (NOTE) SARS-CoV-2 target nucleic acids are NOT DETECTED.  The SARS-CoV-2 RNA is generally detectable in upper respiratory specimens during the acute phase of infection. The lowest concentration of SARS-CoV-2 viral copies this assay can detect is 138 copies/mL. A negative result does not preclude  SARS-Cov-2 infection and should not be used as the sole basis for treatment or other patient management decisions. A negative result may occur with  improper specimen collection/handling, submission of specimen other than nasopharyngeal swab, presence of viral mutation(s) within the areas targeted by this assay, and inadequate number of viral copies(<138 copies/mL). A negative result must be combined with clinical observations, patient history, and epidemiological information. The expected result is Negative.  Fact Sheet for Patients:  BloggerCourse.com  Fact Sheet for Healthcare Providers:  SeriousBroker.it  This test is no t yet approved or cleared by the [place]  FDA and  has been authorized for detection and/or diagnosis of SARS-CoV-2 by FDA under an Emergency Use Authorization (EUA). This EUA will remain  in effect (meaning this test can be used) for the duration of the COVID-19 declaration under Section 564(b)(1) of the Act, 21 U.S.C.section 360bbb-3(b)(1), unless the authorization is terminated  or revoked sooner.       Influenza A by PCR NEGATIVE NEGATIVE Final   Influenza B by PCR NEGATIVE NEGATIVE Final    Comment: (NOTE) The Xpert Xpress SARS-CoV-2/FLU/RSV plus assay is intended as an aid in the diagnosis of influenza from Nasopharyngeal swab specimens and should not be used as a sole basis for treatment. Nasal washings and aspirates are unacceptable for Xpert Xpress  SARS-CoV-2/FLU/RSV testing.  Fact Sheet for Patients: BloggerCourse.com  Fact Sheet for Healthcare Providers: SeriousBroker.it  This test is not yet approved or cleared by the [place]  FDA and has been authorized for detection and/or diagnosis of SARS-CoV-2 by FDA under an Emergency Use Authorization (EUA). This EUA will remain in effect (meaning this test can be used) for the duration of the COVID-19 declaration under Section 564(b)(1) of the Act, 21 U.S.C. section 360bbb-3(b)(1), unless the authorization is terminated or revoked.     Resp Syncytial Virus by PCR NEGATIVE NEGATIVE Final    Comment: (NOTE) Fact Sheet for Patients: BloggerCourse.com  Fact Sheet for Healthcare Providers: SeriousBroker.it  This test is not yet approved or cleared by the [place]  FDA and has been authorized for detection and/or diagnosis of SARS-CoV-2 by FDA under an Emergency Use Authorization (EUA). This EUA will remain in effect (meaning this test can be used) for the duration of the COVID-19 declaration under Section 564(b)(1) of the Act, 21 U.S.C. section 360bbb-3(b)(1), unless the authorization is terminated or revoked.  Performed at [facility], [address] [place], [place] [address]   Aerobic Culture w Gram Stain (superficial specimen)     Status: None   Collection Time: 06/19/23  3:39 PM   Specimen: Wound  Result Value Ref Range Status   Specimen Description WOUND PEG SITE  Final   Special Requests NONE  Final   Gram Stain   Final    NO WBC SEEN FEW GRAM POSITIVE COCCI Performed at [facility], [address] [place], [place] [address]    Culture   Final    ABUNDANT STAPHYLOCOCCUS AUREUS FEW ESCHERICHIA COLI RARE KLEBSIELLA PNEUMONIAE    Report Status 06/23/2023 FINAL  Final   Organism ID, Bacteria STAPHYLOCOCCUS AUREUS  Final   Organism ID,  Bacteria ESCHERICHIA COLI  Final   Organism ID, Bacteria KLEBSIELLA PNEUMONIAE  Final      Susceptibility   Escherichia coli - MIC*    AMPICILLIN 8 SENSITIVE Sensitive     CEFEPIME <=0.12 SENSITIVE Sensitive     CEFTAZIDIME <=1 SENSITIVE Sensitive     CEFTRIAXONE  <=0.25 SENSITIVE Sensitive  CIPROFLOXACIN  <=0.25 SENSITIVE Sensitive     GENTAMICIN <=1 SENSITIVE Sensitive     IMIPENEM <=0.25 SENSITIVE Sensitive     TRIMETH /SULFA  <=20 SENSITIVE Sensitive     AMPICILLIN/SULBACTAM <=2 SENSITIVE Sensitive     PIP/TAZO <=4 SENSITIVE Sensitive ug/mL    * FEW ESCHERICHIA COLI   Klebsiella pneumoniae - MIC*    AMPICILLIN >=32 RESISTANT Resistant     CEFEPIME <=0.12 SENSITIVE Sensitive     CEFTAZIDIME <=1 SENSITIVE Sensitive     CEFTRIAXONE  <=0.25 SENSITIVE Sensitive     CIPROFLOXACIN  <=0.25 SENSITIVE Sensitive     GENTAMICIN <=1 SENSITIVE Sensitive     IMIPENEM <=0.25 SENSITIVE Sensitive     TRIMETH /SULFA  <=20 SENSITIVE Sensitive     AMPICILLIN/SULBACTAM 4 SENSITIVE Sensitive     PIP/TAZO <=4 SENSITIVE Sensitive ug/mL    * RARE KLEBSIELLA PNEUMONIAE   Staphylococcus aureus - MIC*    CIPROFLOXACIN  <=0.5 SENSITIVE Sensitive     ERYTHROMYCIN <=0.25 SENSITIVE Sensitive     GENTAMICIN <=0.5 SENSITIVE Sensitive     OXACILLIN <=0.25 SENSITIVE Sensitive     TETRACYCLINE <=1 SENSITIVE Sensitive     VANCOMYCIN 1 SENSITIVE Sensitive     TRIMETH /SULFA  <=10 SENSITIVE Sensitive     CLINDAMYCIN <=0.25 SENSITIVE Sensitive     RIFAMPIN <=0.5 SENSITIVE Sensitive     Inducible Clindamycin NEGATIVE Sensitive     LINEZOLID 2 SENSITIVE Sensitive     * ABUNDANT STAPHYLOCOCCUS AUREUS    Labs: CBC: Recent Labs  Lab 01/17/24 1936 01/19/24 0351  WBC 9.5 6.6  HGB 9.8* 9.0*  HCT 29.8* 27.7*  MCV 91.7 92.0  PLT 246 229   Basic Metabolic Panel: Recent Labs  Lab 01/17/24 1935 01/19/24 0351  NA 131* 133*  K 4.8 4.6  CL 101 104  CO2 19* 19*  GLUCOSE 214* 162*  BUN 46* 48*  CREATININE 2.32*  2.05*  CALCIUM  8.4* 8.2*   Liver Function Tests: Recent Labs  Lab 01/17/24 1935  AST 18  ALT 14  ALKPHOS 73  BILITOT 0.4  PROT 6.4*  ALBUMIN  3.0*   CBG: Recent Labs  Lab 01/19/24 0741 01/19/24 1234 01/20/24 0854 01/20/24 1121 01/20/24 1621  GLUCAP 128* 126* 127* 184* 230*    Discharge time spent: approximately 45 minutes spent on discharge counseling, evaluation of patient on day of discharge, and coordination of discharge planning with nursing, social work, pharmacy and case management  Signed: Lonni SHAUNNA Dalton, MD [facility] 01/20/2024

## 2024-01-20 NOTE — Progress Notes (Addendum)
 Physical Therapy Treatment Patient Details Name: John Walton MRN: [id number] DOB: Aug 06, 1932 Today's Date: 01/20/2024   History of Present Illness 89 y.o. M admitted 10/18 with syncope.  PMH: CKD IV baseline 2-2.5, CAD/PVD, HTN, DM, stroke, mild left sided weakness, hyperthyroidism on methimazole , hx right CEA, HLD, aortic stenosis and severe dysphagia due to cervical spine osteophytes requiring PEG tube    PT Comments  Pt tolerates treatment well, transferring and ambulating for household distances with support of RW. Pt initially requires increased time and multiple efforts to ascend into standing from bedside, this improves with multiple successful transfer attempts after ambulation. Pt's spouse expresses concern about the pt attempting to mobilize at night as she has no other caregiver support and is recovering from a recent hospitalization. Currently the pt does not require physical assistance to mobilize when utilizing a RW, and has aide assistance available daily from a PCA. Based on physical performance pt appears appropriate for discharge home with HHPT. If returning home the pt will benefit from utilizing their BSC at the bedside overnight to reduce falls risk.   If plan is discharge home, recommend the following: A little help with bathing/dressing/bathroom;Assistance with cooking/housework;Direct supervision/assist for medications management;Direct supervision/assist for financial management;Assist for transportation;Help with stairs or ramp for entrance;Supervision due to cognitive status   Can travel by private vehicle     No  Equipment Recommendations  None recommended by PT    Recommendations for Other Services       Precautions / Restrictions Precautions Precautions: Fall Recall of Precautions/Restrictions: Impaired Precaution/Restrictions Comments: pt with poor memory during session Restrictions Weight Bearing Restrictions Per Provider Order: No     Mobility  Bed  Mobility                    Transfers Overall transfer level: Needs assistance Equipment used: Rolling walker (2 wheels) Transfers: Sit to/from Stand Sit to Stand: Contact guard assist           General transfer comment: increased time and multiple efforts to stand on initial attempt. Pt stands twice more from bedside    Ambulation/Gait Ambulation/Gait assistance: Contact guard assist Gait Distance (Feet): 200 Feet Assistive device: Rolling walker (2 wheels) Gait Pattern/deviations: Step-through pattern, Trunk flexed Gait velocity: reduced Gait velocity interpretation: <1.8 ft/sec, indicate of risk for recurrent falls   General Gait Details: pt with slowed step-through gait, mild increase in trunk flexion throughout ambulation   Stairs             Wheelchair Mobility     Tilt Bed    Modified Rankin (Stroke Patients Only)       Balance Overall balance assessment: Needs assistance Sitting-balance support: No upper extremity supported, Feet supported Sitting balance-Leahy Scale: Good     Standing balance support: Single extremity supported, Reliant on assistive device for balance Standing balance-Leahy Scale: Poor                              Communication Communication Communication: Impaired Factors Affecting Communication: Hearing impaired  Cognition Arousal: Alert Behavior During Therapy: WFL for tasks assessed/performed (mild agitation during discussion regarding potential short term rehab placement)   PT - Cognitive impairments: Memory                       PT - Cognition Comments: pt restating stories he had previously told this PT during today's session Following commands: Intact  Cueing Cueing Techniques: Verbal cues  Exercises      General Comments General comments (skin integrity, edema, etc.): pt in NAD, denies symptoms when mobilizing      Pertinent Vitals/Pain Pain Assessment Pain Assessment:  No/denies pain    Home Living                          Prior Function            PT Goals (current goals can now be found in the care plan section) Acute Rehab PT Goals Patient Stated Goal: to go home Progress towards PT goals: Progressing toward goals    Frequency    Min 2X/week      PT Plan      Co-evaluation              AM-PAC PT 6 Clicks Mobility   Outcome Measure  Help needed turning from your back to your side while in a flat bed without using bedrails?: A Little Help needed moving from lying on your back to sitting on the side of a flat bed without using bedrails?: A Little Help needed moving to and from a bed to a chair (including a wheelchair)?: A Little Help needed standing up from a chair using your arms (e.g., wheelchair or bedside chair)?: A Little Help needed to walk in hospital room?: A Little Help needed climbing 3-5 steps with a railing? : A Lot 6 Click Score: 17    End of Session Equipment Utilized During Treatment: Gait belt Activity Tolerance: Patient tolerated treatment well Patient left: in bed;with call bell/phone within reach;with family/visitor present Nurse Communication: Mobility status PT Visit Diagnosis: Unsteadiness on feet (R26.81);Muscle weakness (generalized) (M62.81)     Time: 8681-8597 PT Time Calculation (min) (ACUTE ONLY): 44 min  Charges:    $Gait Training: 8-22 mins $Therapeutic Activity: 8-22 mins PT General Charges $$ ACUTE PT VISIT: 1 Visit                     Bernardino JINNY Ruth, PT, DPT Acute Rehabilitation Office [phone]    Bernardino JINNY Ruth 01/20/2024, 2:17 PM

## 2024-01-20 NOTE — TOC Transition Note (Signed)
 Transition of Care [facility]) - Discharge Note   Patient Details  Name: John Walton MRN: [id number] Date of Birth: [birth year]  [facility]) CM/SW Contact:  Rosalva Jon Bloch, RN Phone Number: 01/20/2024, 3:51 PM   Clinical Narrative:    Patient will DC to: home Anticipated DC date: 01/20/2024 Family notified: yes Transport by: car  Admitted with syncope. From home with wife. Per MD patient ready for DC today. RN, patient, and patient's family notified of DC. Pt/wife agreeable to home health services. Preference : [facility]. Referral made and accepted by [facility]. Pt without DME needs.  Post hospital f/u noted on AVS.  Wife to provide transportation to home. Pt without RX meds concerns. Rx med will be given to pt prior to d/c from [facility].   RNCM will sign off for now as intervention is no longer needed. Please consult us  again if new needs arise.    Final next level of care: Home w Home Health Services     Patient Goals and CMS Choice     Choice offered to / list presented to : Patient, Spouse      Discharge Placement                       Discharge Plan and Services Additional resources added to the After Visit Summary for                            [facility] Arranged: PT, OT, Nurse's Aide HH Agency: [facility] Date [facility] Agency Contacted: 01/20/24 Time HH Agency Contacted: 1551 Representative spoke with at [facility] Agency: [place]  Social Drivers of Health (SDOH) Interventions SDOH Screenings   Food Insecurity: No Food Insecurity (01/19/2024)  Housing: Unknown (01/19/2024)  Transportation Needs: No Transportation Needs (01/19/2024)  Utilities: Not At Risk (01/19/2024)  Depression (PHQ2-9): Low Risk  (02/13/2021)  Social Connections: Patient Declined (01/19/2024)  Tobacco Use: Medium Risk (01/19/2024)     Readmission Risk Interventions     No data to display

## 2024-01-20 NOTE — Progress Notes (Signed)
 Initial Nutrition Assessment  DOCUMENTATION CODES:   Non-severe (moderate) malnutrition in context of chronic illness  INTERVENTION:  Continue using PEG for medications, free water , and ProSource  ProSource TF 20 60 ml BID: each provides 80 kcal and 20 g protein   Continued regular diet and adequate PO intake Encouraged asking for Ensure or ONS if wanted; pt currently denies wanting them  NUTRITION DIAGNOSIS:   Moderate Malnutrition related to chronic illness as evidenced by moderate fat depletion, moderate muscle depletion, severe muscle depletion.  GOAL:   Patient will meet greater than or equal to 90% of their needs  MONITOR:   PO intake  REASON FOR ASSESSMENT:   Consult Assessment of nutrition requirement/status  ASSESSMENT:   Pt with hx of diabetes, hyperthyroidism, HTN, CKD IV, DISH w/ large osteophyte causing mass effect on esophagus and dysphagia. Pt presents with syncope and collapse, presented with continued dysphagia and possible aspiration pneumonia.   Spoke with SLP and discussed pt's status/ Pt has recurrent aspiration due to dysphagia but has previously been able to manage aspiration by clearing throat. However, when pt experiences acute illness/injury he becomes unable to clear throat properly which results in liquid/food entering lungs. Pt had MBS conducted yesterday which showed pt has most issues with thin liquids and SLP recommended nectar thick liquids temporarily while pt recovered. Pt now doing much better this morning and less lethargic. SLP feels comfortable transitioning back to thin liquids.  Pt presents with PEG tube in place. Pt has had PEG for 4 years but has not been using PEG for supplemental nutrition but rather for free water  and medication administration. Pt's wife reports she gives pt 20 oz of water  3x daily per recommendations of pt's MD to help with fluid intake. Pt also receives Prosource BID for protein intake. Otherwise, pt eats by mouth  and generally eats a large breakfast then a late lunch/early supper. Pt reports he is picky so he sticks to eating the same foods most days and loves to drink coffee. Pt will also drink juice throughout the day. Encouraged pt to continue eating what he enjoys and using safe swallowing techniques to help him eat by mouth.   Physical exam shows moderate fat depletions and severe muscle depletions, indicative of malnutrition. Suspect malnutrition is chronic and ongoing issue for pt due to chronic illness and aging. Pt followed very closely by cardiologist and states he has actually been gaining some weight recently.    Medications: ProSource TF 20 BID SSI 0-5 units daily SSI 0-9 units TID Unasyn   Labs:  CBG x 24 hr: 126-184 mg/dl J8r 6.5  NUTRITION - FOCUSED PHYSICAL EXAM:  Flowsheet Row Most Recent Value  Orbital Region Moderate depletion  Upper Arm Region Moderate depletion  Thoracic and Lumbar Region Moderate depletion  Buccal Region Moderate depletion  Temple Region Moderate depletion  Clavicle Bone Region Severe depletion  Clavicle and Acromion Bone Region Severe depletion  Scapular Bone Region Severe depletion  Dorsal Hand Severe depletion  Patellar Region Moderate depletion  Anterior Thigh Region Moderate depletion  Posterior Calf Region Moderate depletion  Edema (RD Assessment) None  Hair Reviewed  Eyes Reviewed  Mouth Reviewed  [missing teeth]  Skin Reviewed  Nails Reviewed    Diet Order:   Diet Order             Diet regular Room service appropriate? Yes with Assist; Fluid consistency: Thin  Diet effective now  EDUCATION NEEDS:   Education needs have been addressed  Skin:  Skin Assessment: Reviewed RN Assessment  Last BM:  PTA  Height:   Ht Readings from Last 1 Encounters:  01/19/24 5' 6 (1.676 m)    Weight:   Wt Readings from Last 1 Encounters:  01/19/24 71.1 kg    BMI:  Body mass index is 25.3 kg/m.  Estimated  Nutritional Needs:   Kcal:  1500-1700  Protein:  65-80g  Fluid:  1.5-1.7L    Josette Glance, MS, RDN, LDN Clinical Dietitian I Please reach out via secure chat

## 2024-01-20 NOTE — Progress Notes (Addendum)
 Speech Language Pathology Treatment: Dysphagia  Patient Details Name: John Walton MRN: [id number] DOB: July 18, 1932 Today's Date: 01/20/2024 Time: 8994-8980 SLP Time Calculation (min) (ACUTE ONLY): 14 min  Assessment / Plan / Recommendation Clinical Impression    Recs: Advance liquids back to thin; continue regular solids; meds via PEG.  Swallowing function appears to be back to baseline.   F/u after yesterday's assessment. John Walton was at the bedside, expressing concern about the order for thickened liquids. Reiterated our use of thickener as a temporary measure to allow time for John Walton to return to baseline and not expose him to unnecessary risk of more aspiration then he could handle.  He is doing much better today - alert and talkative, seated at the EOB with feet on floor, feeding himself breakfast.  Drank thin coffee, water  with frequent throat-clearing, only one large coughing episode, but overall his presentation is consistent with baseline function.  Will advance back to thin liquids.  No further SLP f/u warranted.    HPI HPI: John Walton is an 89 y.o. male who presented to [facility] ED on 10/19 with syncope and vomiting episode.  Pt known to this clinician from 07/18/23 and 01/12/24 OP MBS studies. Hx of chronic pharyngeal dysphagia due to DISH with occasional aspiration but no episodes of pna. PEG for hydration; continues to eat regular diet. PMHx includes right carotid artery stenosis, hypertension, diabetes, hyperlipidemia, CKD stage IIIb-IV, CVA, BPH status post TURP, hyperthyroidism, PAD, CAD, aortic stenosis. CT cervical spine 02/12/23:  Large flowing anterior osteophytes throughout the cervical spine and upper thoracic spine. MBS 01/19/24 penetration liquids, solid food residue, no large volume aspiration (c/w prior findings).      SLP Plan  All goals met          Recommendations  Diet recommendations: Regular;Thin liquid Liquids provided via: Cup;Straw Medication  Administration: Via alternative means Supervision: Patient able to self feed Compensations: Minimize environmental distractions Postural Changes and/or Swallow Maneuvers:  (site EOB with feet on floor)                  Oral care BID     Dysphagia, pharyngeal phase (R13.13)     All goals met    John Walton L. [name], MA CCC/SLP Clinical Specialist - Acute Care SLP Acute Rehabilitation Services Office number [phone]  John Walton  01/20/2024, 10:24 AM

## 2024-01-20 NOTE — Consult Note (Signed)
 Palliative Care Consult Note                                  Date: 01/20/2024   Patient Name: John Walton  DOB: 1932/09/13  MRN: [id number]  Age / Sex: 89 y.o., male  PCP: Waymond Ryan Askew, MD Referring Physician: Jonel Lonni SQUIBB, *  Reason for Consultation: Establishing goals of care  HPI/Patient Profile: 89 y.o. male  with past medical history of aortic stenosis, CKD 4 (baseline creatinine 2-2.5), CAD, PVD, DM, severe dysphagia due to cervical spine osteophytes requiring PEG tube, stroke with residual mild left-sided weakness, hypertension, and hypothyroidism on methimazole  who presented to the ED on 01/17/2024 with syncope.  Echocardiogram showed severe aortic stenosis.  Palliative Medicine has been consulted for goals of care discussions and complex medical decision making.   Clinical Assessment and Goals of Care:   Extensive chart review has been completed including labs, vital signs, imaging, progress/consult notes, orders, medications and available advance directive documents.    I met with his wife/John Walton to discuss diagnosis, prognosis, GOC, disposition, and options.  I introduced Palliative Medicine as specialized medical care for people living with serious illness. It focuses on providing relief from the symptoms and stress of a serious illness.   Created space and opportunity for patient and family to express thoughts and feelings regarding current medical situation. Values and goals of care were attempted to be elicited.  Life Review: Patient and wife have been married for 50 years. They share 1 daughter Alberteen), who lives in [place], [place] . Patient also has a son Vale) from a previous marriage, who lives out of state.  Functional Status: Patient lives at home with John Walton and her son (patient's step-son) John Walton. At baseline he is ambulatory, and needs some assistance with ADLs. He receives  personal care  services through his [employer] benefits, 28 hours per week.    Discussion: We discussed patient's current medical situation and what it means in the larger context of his ongoing co-morbidities. Hospital course and current clinical status was reviewed.   Reviewed that patient's syncope is most likely due to severe aortic stenosis. Reviewed previous discussion (with Dr. Jonel and patient's cardiologist) about aortic valve repair, and that patient has declined this.   Discussed natural trajectory of severe aortic stenosis, emphasizing it is progressive and ultimately terminal. I shared my concern that as this condition progresses, patient will have worsening symptoms, including light-headedness and shortness of breath.   Discussed with wife my recommendation for outpatient palliative referral, with low threshold to transition to hospice in the future. I explained that hospice would assist with personal care for patient, support for family, and assistance with symptom management when he further declines. Wife understands and agrees.   Wife expresses interest in having patient go to rehab, to improve strength and mobility, prior to returning home. CSW and PT have stopped and explain that patient is not eligible for rehab based on PT assessment earlier today.   Questions and concerns addressed. Emotional support provided.    Review of Systems  Respiratory:  Negative for shortness of breath.     Objective:   Primary Diagnoses: Present on Admission:  Syncope and collapse  Aspiration pneumonia [facility])   Physical Exam Constitutional:      General: He is not in acute distress.    Comments: Frail, chronically ill-appearing  Pulmonary:     Effort: Pulmonary effort  is normal.  Neurological:     Mental Status: He is alert and oriented to person, place, and time.  Psychiatric:        Cognition and Memory: Memory is impaired.     Palliative Assessment/Data: PPS 50%     Assessment & Plan:    SUMMARY OF RECOMMENDATIONS   Discussed that severe aortic stenosis is progressive, and ultimately terminal  Outpatient palliative referral with option to transition to hospice in the future ([facility]) Pending discharge home today  Medical Decision Making: Need to involve wife for any complex decisions, due to patient's memory impairment  Code Status/Advance Care Planning: DNR - Limited  Prognosis:  Difficult to determine, but less than 6 months would not be surprising  Discharge Planning:  Home with Home Health    Thank you for allowing us  to participate in the care of John Walton   Time Total: 78 minutes  Detailed review of medical records (labs, imaging, vital signs), medically appropriate exam, discussed with treatment team, counseling and education to patient, family, & staff, documenting clinical information, coordination of care.   Signed by: Recardo Loll, NP Palliative Medicine Team  Team Phone # [phone]  For individual providers, please see AMION

## 2024-01-21 LAB — T3: T3, Total: 102 ng/dL (ref 71–180)

## 2024-02-11 ENCOUNTER — Encounter (HOSPITAL_BASED_OUTPATIENT_CLINIC_OR_DEPARTMENT_OTHER): Payer: Self-pay

## 2024-02-11 ENCOUNTER — Other Ambulatory Visit (HOSPITAL_BASED_OUTPATIENT_CLINIC_OR_DEPARTMENT_OTHER): Payer: Self-pay

## 2024-02-11 ENCOUNTER — Other Ambulatory Visit: Payer: Self-pay

## 2024-02-11 ENCOUNTER — Emergency Department (HOSPITAL_BASED_OUTPATIENT_CLINIC_OR_DEPARTMENT_OTHER): Admission: EM | Admit: 2024-02-11 | Discharge: 2024-02-11 | Disposition: A | Discharge status: Home / Self Care

## 2024-02-11 ENCOUNTER — Emergency Department (HOSPITAL_BASED_OUTPATIENT_CLINIC_OR_DEPARTMENT_OTHER)

## 2024-02-11 DIAGNOSIS — E1165 Type 2 diabetes mellitus with hyperglycemia: Secondary | ICD-10-CM | POA: Diagnosis not present

## 2024-02-11 DIAGNOSIS — Z7984 Long term (current) use of oral hypoglycemic drugs: Secondary | ICD-10-CM | POA: Diagnosis not present

## 2024-02-11 DIAGNOSIS — R739 Hyperglycemia, unspecified: Secondary | ICD-10-CM

## 2024-02-11 DIAGNOSIS — Z7982 Long term (current) use of aspirin: Secondary | ICD-10-CM | POA: Insufficient documentation

## 2024-02-11 DIAGNOSIS — N390 Urinary tract infection, site not specified: Secondary | ICD-10-CM

## 2024-02-11 LAB — BASIC METABOLIC PANEL WITH GFR
Anion gap: 16 — ABNORMAL HIGH (ref 5–15)
BUN: 61 mg/dL — ABNORMAL HIGH (ref 8–23)
CO2: 18 mmol/L — ABNORMAL LOW (ref 22–32)
Calcium: 9.1 mg/dL (ref 8.9–10.3)
Chloride: 102 mmol/L (ref 98–111)
Creatinine, Ser: 2.5 mg/dL — ABNORMAL HIGH (ref 0.61–1.24)
GFR, Estimated: 24 mL/min — ABNORMAL LOW (ref >60)
Glucose, Bld: 269 mg/dL — ABNORMAL HIGH (ref 70–99)
Potassium: 4.3 mmol/L (ref 3.5–5.1)
Sodium: 136 mmol/L (ref 135–145)

## 2024-02-11 LAB — URINALYSIS, ROUTINE W REFLEX MICROSCOPIC
Bilirubin Urine: NEGATIVE
Glucose, UA: NEGATIVE mg/dL
Ketones, ur: NEGATIVE mg/dL
Nitrite: NEGATIVE
Protein, ur: 100 mg/dL — AB
Specific Gravity, Urine: 1.02 (ref 1.005–1.030)
pH: 5.5 (ref 5.0–8.0)

## 2024-02-11 LAB — CBC
HCT: 26.9 % — ABNORMAL LOW (ref 39.0–52.0)
Hemoglobin: 8.8 g/dL — ABNORMAL LOW (ref 13.0–17.0)
MCH: 29.9 pg (ref 26.0–34.0)
MCHC: 32.7 g/dL (ref 30.0–36.0)
MCV: 91.5 fL (ref 80.0–100.0)
Platelets: 313 K/uL (ref 150–400)
RBC: 2.94 MIL/uL — ABNORMAL LOW (ref 4.22–5.81)
RDW: 12.7 % (ref 11.5–15.5)
WBC: 8.4 K/uL (ref 4.0–10.5)
nRBC: 0 % (ref 0.0–0.2)

## 2024-02-11 LAB — URINALYSIS, MICROSCOPIC (REFLEX): WBC, UA: >50 WBC/hpf (ref 0–5)

## 2024-02-11 LAB — I-STAT VENOUS BLOOD GAS, ED
Acid-base deficit: 4 mmol/L — ABNORMAL HIGH (ref 0.0–2.0)
Bicarbonate: 21.1 mmol/L (ref 20.0–28.0)
Calcium, Ion: 1.2 mmol/L (ref 1.15–1.40)
HCT: 26 % — ABNORMAL LOW (ref 39.0–52.0)
Hemoglobin: 8.8 g/dL — ABNORMAL LOW (ref 13.0–17.0)
O2 Saturation: 90 %
Patient temperature: 98.2
Potassium: 4.2 mmol/L (ref 3.5–5.1)
Sodium: 137 mmol/L (ref 135–145)
TCO2: 22 mmol/L (ref 22–32)
pCO2, Ven: 38.5 mmHg — ABNORMAL LOW (ref 44–60)
pH, Ven: 7.346 (ref 7.25–7.43)
pO2, Ven: 62 mmHg — ABNORMAL HIGH (ref 32–45)

## 2024-02-11 LAB — CBG MONITORING, ED: Glucose-Capillary: 265 mg/dL — ABNORMAL HIGH (ref 70–99)

## 2024-02-11 MED ORDER — CEPHALEXIN 250 MG/5ML PO SUSR
500.0000 mg | Freq: Four times a day (QID) | ORAL | 0 refills | Status: AC
  Filled 2024-02-11: qty 200, 5d supply, fill #0

## 2024-02-11 MED ORDER — LACTATED RINGERS IV BOLUS
1000.0000 mL | Freq: Once | INTRAVENOUS | Status: AC
  Administered 2024-02-11: 1000 mL via INTRAVENOUS

## 2024-02-11 NOTE — ED Triage Notes (Addendum)
 Wife reports dizziness, feeling shaky yesterday morning. Elevated blood sugar and low BP this morning.   Pt denies current dizziness, N/V, chest pain, SHOB   A&Ox3 not oriented to time at baseline-dementia  Recent hospital admission for aortic stenosis-has caused dizziness, LOC in the past

## 2024-02-11 NOTE — Discharge Instructions (Addendum)
 Please follow-up with your primary doctor.  We are prescribing antibiotics.  Please take it as prescribed for the full course.  Return for fevers, chills, severe headache, chest pain, shortness of breath, palpitations, lethargy, altered mental status, abdominal pain, uncontrolled nausea or vomiting, stop urinating or develop any new or worsening symptoms that are concerning to you.

## 2024-02-11 NOTE — ED Provider Notes (Signed)
 [place] [facility] DEPARTMENT AT [facility] Provider Note   CSN: [id number] Arrival date & time: 02/11/24  1425     Patient presents with: Dizziness   John Walton is a 89 y.o. male.   This is an 89 year old male presenting emergency department for elevated blood sugar.  He has reportedly a history of diabetes controlled with diet and exercise is not on antihyperglycemic.  Checked his blood sugar this morning after breakfast was in the 300s.  Patient has no complaints currently.  However yesterday morning had an episode where he was lightheaded and felt shaky.  This lasted for roughly 30 minutes and has resolved.  Denies any fevers, chills, headache, chest pain, shortness of breath, abdominal pain.  No dysuria or urinary symptoms.  States he feels his normal state of health currently.   Dizziness      Prior to Admission medications   Medication Sig Start Date End Date Taking? Authorizing Provider  acetaminophen  (TYLENOL ) 500 MG tablet Take 500 mg by mouth as needed for pain.    [provider]  aspirin  EC 81 MG tablet Take 1 tablet (81 mg total) by mouth daily. 12/26/20   Singh, Prashant K, MD  atorvastatin  (LIPITOR) 40 MG tablet Take 40 mg by mouth at bedtime.    [provider]  Calcium  Carb-Cholecalciferol  (OYSTER SHELL CALCIUM  W/D) 500-5 MG-MCG TABS Take 1 tablet by mouth in the morning and at bedtime. 10/31/20   [provider]  carboxymethylcellulose (REFRESH PLUS) 0.5 % SOLN Place 1 drop into both eyes every hour while awake.    [provider]  ferrous sulfate  325 (65 FE) MG tablet Take 1 tablet (325 mg total) by mouth daily with breakfast. 12/26/20   Singh, Prashant K, MD  HYDROcodone -Acetaminophen  (NORCO PO) Take 1 tablet by mouth as needed (pain).    [provider]  latanoprost  (XALATAN ) 0.005 % ophthalmic solution Place 1 drop into both eyes at bedtime.  03/13/19   [provider]  Lifitegrast  (XIIDRA ) 5  % SOLN Place 1 drop into both eyes in the morning and at bedtime.    [provider]  loperamide  (IMODIUM ) 2 MG capsule Take 2 mg by mouth as needed for diarrhea or loose stools.    [provider]  methimazole  (TAPAZOLE ) 5 MG tablet Take 5 mg by mouth daily. 04/07/23   [provider]  mupirocin  ointment (BACTROBAN ) 2 % Place 1 Application into the nose 2 (two) times daily. Patient taking differently: Apply 1 Application topically 2 (two) times daily. 06/20/23   Will Almarie MATSU, MD  Nutritional Supplements (FEEDING SUPPLEMENT, OSMOLITE 1.5 CAL,) LIQD Place 237 mLs into feeding tube continuous. 04/03/20   [provider]  Nutritional Supplements (PROSOURCE) LIQD Give 45 mLs by tube in the morning and at bedtime.    [provider]  pantoprazole  (PROTONIX ) 40 MG tablet Take 40 mg by mouth as needed for heartburn.    [provider]  prednisoLONE  acetate (PRED FORTE ) 1 % ophthalmic suspension Place 1 drop into the left eye daily. 05/13/23   [provider]  SIMBRINZA 1-0.2 % SUSP Place 1 drop into both eyes in the morning and at bedtime.  05/16/19   [provider]  Varenicline  Tartrate 0.03 MG/ACT SOLN Place 1 spray into both nostrils 2 (two) times daily. 06/16/23   [provider]  vitamin B-12 (CYANOCOBALAMIN ) 500 MCG tablet Take 1 tablet by mouth 2 (two) times daily. 01/03/21   [provider]  Water  For Irrigation, Sterile (FREE WATER ) SOLN Place 120 mLs into feeding tube 5 (five) times daily. 04/03/20   Cheryle Page, MD  White Petrolatum-Mineral Oil (TEARS AGAIN) OINT Place 1 application  into both eyes in the morning and at bedtime. Refresh PM    [provider]    Allergies: Other, Influenza vaccines, and Robitussin cold cough+ chest [dextromethorphan -guaifenesin ]    Review of Systems  Neurological:  Positive for dizziness.    Updated Vital Signs BP (!) 109/52   Pulse 88   Temp 98.2 F (36.8  C) (Oral)   Resp 18   SpO2 100%   Physical Exam Vitals reviewed.  Constitutional:      General: He is not in acute distress.    Appearance: He is not toxic-appearing.  HENT:     Head: Normocephalic.     Nose: Nose normal.  Eyes:     Conjunctiva/sclera: Conjunctivae normal.  Cardiovascular:     Rate and Rhythm: Normal rate and regular rhythm.  Pulmonary:     Effort: Pulmonary effort is normal.  Abdominal:     General: Abdomen is flat. There is no distension.     Tenderness: There is no abdominal tenderness. There is no guarding or rebound.     Comments: G-tube in place  Musculoskeletal:     Right lower leg: Edema present.     Left lower leg: Edema present.  Skin:    General: Skin is warm.     Capillary Refill: Capillary refill takes less than 2 seconds.  Neurological:     General: No focal deficit present.     Mental Status: He is alert.  Psychiatric:        Mood and Affect: Mood normal.        Behavior: Behavior normal.     (all labs ordered are listed, but only abnormal results are displayed) Labs Reviewed  CBC - Abnormal; Notable for the following components:      Result Value   RBC 2.94 (*)    Hemoglobin 8.8 (*)    HCT 26.9 (*)    All other components within normal limits  BASIC METABOLIC PANEL WITH GFR - Abnormal; Notable for the following components:   CO2 18 (*)    Glucose, Bld 269 (*)    BUN 61 (*)    Creatinine, Ser 2.50 (*)    GFR, Estimated 24 (*)    Anion gap 16 (*)    All other components within normal limits  URINALYSIS, ROUTINE W REFLEX MICROSCOPIC - Abnormal; Notable for the following components:   APPearance CLOUDY (*)    Hgb urine dipstick MODERATE (*)    Protein, ur 100 (*)    Leukocytes,Ua LARGE (*)    All other components within normal limits  URINALYSIS, MICROSCOPIC (REFLEX) - Abnormal; Notable for the following components:   Bacteria, UA MANY (*)    All other components within normal limits  CBG MONITORING, ED - Abnormal; Notable  for the following components:   Glucose-Capillary 265 (*)    All other components within normal limits  I-STAT VENOUS BLOOD GAS, ED - Abnormal; Notable for the following components:   pCO2, Ven 38.5 (*)    pO2, Ven 62 (*)    Acid-base deficit 4.0 (*)    HCT 26.0 (*)    Hemoglobin 8.8 (*)    All other components within normal limits    EKG: EKG Interpretation Date/Time:  Wednesday February 11 2024 15:07:21 EST Ventricular Rate:  91 PR  Interval:  206 QRS Duration:  154 QT Interval:  409 QTC Calculation: 504 R Axis:   -79  Text Interpretation: Sinus rhythm RBBB and LAFB Confirmed by Neysa Clap [phone]) on 02/11/2024 3:34:06 PM  Radiology: [facility] Chest Portable 1 View Result Date: 02/11/2024 CLINICAL DATA:  Near syncope EXAM: PORTABLE CHEST 1 VIEW COMPARISON:  Chest x-ray 01/17/2024 FINDINGS: The heart size and mediastinal contours are within normal limits. There are atherosclerotic calcifications of the aortic arch. Both lungs are clear. The visualized skeletal structures are unremarkable. IMPRESSION: No active disease. Electronically Signed   By: Greig Pique M.D.   On: 02/11/2024 16:29     Procedures   Medications Ordered in the ED  lactated ringers  bolus 1,000 mL (1,000 mLs Intravenous New Bag/Given 02/11/24 1554)    Clinical Course as of 02/11/24 1655  Wed Feb 11, 2024  1502 PMH per chart review: CKD IV baseline 2-2.5, CAD/PVD, HTN, DM, stroke, mild left sided weakness, hyperthyroidism on methimazole , hx right CEA, HLD, aortic stenosis and severe dysphagia due to cervical spine osteophytes requiring PEG tube [TY]  1502 Echo on 10/20 while hospitalized for syncope:   Per discharge summary; Aortic stenosis Echocardiogram here showed a low-flow, low gradient aortic stenosis.  This was discussed by phone with his personal cardiologist, and the patient articulated clearly that he does not wish to undergo TAVR.  Palliative care was consulted, and she has a clear right  understanding of a possible prognosis if this is truly end-stage aortic stenosis and progressing.  She was connected with outpatient palliative services  [TY]  1532 CBC(!) No leukocytosis to suggest systemic infection.  Does have anemia, appears to be at baseline when compared to old lab values [TY]  1534 EKG 12-Lead EKG appears to be sinus rhythm.  Right bundle branch pattern.  Does have QTc prolongation [TY]  1600 Basic metabolic panel(!) No electrolyte abnormalities.  Low bicarb, but appears chronically low.  Is hyperglycemic at 269.  Appears to have essentially baseline renal function.  Borderline elevated anion gap.  Will get VBG to further evaluate for DKA [TY]  1634 pH, Ven: 7.346 [TY]  1635 Urinalysis, Routine w reflex microscopic -Urine, Clean Catch(!) Does have evidence of UTI. Will need antibiotics.  [TY]  1654 Will discharge in stable condition.  No emergent medical condition identified.  Antibiotics for UTI.  Follow-up with PCP.  Return precautions given. [TY]    Clinical Course User Index [TY] Neysa Clap PARAS, DO                                 Medical Decision Making This is a well-appearing 89 year old male presenting emergency department with concern for hyperglycemia.  He is afebrile nontachycardic, slightly soft blood pressure 109/52.  Had some chills and shakes yesterday with associated lightheadedness, but has not had the symptoms since yesterday morning.  He reports feeling his usual state and has no complaints.  His wife is concerned about his blood sugar being in the 300s.  Does not take any antihyperglycemic.  Will check basic screening labs, EKG. See ED course for further mdm/disposition   Amount and/or Complexity of Data Reviewed Independent Historian:     Details: Wife notes at baseline mentation. External Data Reviewed:     Details: See ED course Labs: ordered. Decision-making details documented in ED Course.    Details: See ED course Radiology: ordered and  independent interpretation performed.    Details:  Chest x-ray without pneumonia pneumothorax my dependent review. ECG/medicine tests: independent interpretation performed. Decision-making details documented in ED Course.    Details: No ischemic changes  Risk Decision regarding hospitalization. Diagnosis or treatment significantly limited by social determinants of health. Risk Details: Dementia; lives with wife.        Final diagnoses:  Hyperglycemia    ED Discharge Orders     None          Neysa Caron PARAS, DO 02/11/24 1655

## 2024-03-18 ENCOUNTER — Encounter (HOSPITAL_COMMUNITY): Payer: Self-pay | Admitting: Internal Medicine

## 2024-03-18 ENCOUNTER — Inpatient Hospital Stay (HOSPITAL_COMMUNITY)
Admission: EM | Admit: 2024-03-18 | Discharge: 2024-04-01 | Disposition: E | Attending: Internal Medicine | Admitting: Internal Medicine

## 2024-03-18 ENCOUNTER — Emergency Department (HOSPITAL_COMMUNITY)

## 2024-03-18 DIAGNOSIS — Z9079 Acquired absence of other genital organ(s): Secondary | ICD-10-CM

## 2024-03-18 DIAGNOSIS — E871 Hypo-osmolality and hyponatremia: Secondary | ICD-10-CM | POA: Diagnosis present

## 2024-03-18 DIAGNOSIS — I350 Nonrheumatic aortic (valve) stenosis: Secondary | ICD-10-CM | POA: Diagnosis not present

## 2024-03-18 DIAGNOSIS — H409 Unspecified glaucoma: Secondary | ICD-10-CM | POA: Diagnosis present

## 2024-03-18 DIAGNOSIS — E861 Hypovolemia: Secondary | ICD-10-CM | POA: Diagnosis present

## 2024-03-18 DIAGNOSIS — Y838 Other surgical procedures as the cause of abnormal reaction of the patient, or of later complication, without mention of misadventure at the time of the procedure: Secondary | ICD-10-CM | POA: Diagnosis present

## 2024-03-18 DIAGNOSIS — N179 Acute kidney failure, unspecified: Secondary | ICD-10-CM | POA: Diagnosis not present

## 2024-03-18 DIAGNOSIS — E119 Type 2 diabetes mellitus without complications: Secondary | ICD-10-CM | POA: Diagnosis not present

## 2024-03-18 DIAGNOSIS — A419 Sepsis, unspecified organism: Secondary | ICD-10-CM | POA: Diagnosis present

## 2024-03-18 DIAGNOSIS — G9340 Encephalopathy, unspecified: Secondary | ICD-10-CM | POA: Diagnosis present

## 2024-03-18 DIAGNOSIS — I080 Rheumatic disorders of both mitral and aortic valves: Secondary | ICD-10-CM | POA: Diagnosis present

## 2024-03-18 DIAGNOSIS — Z79899 Other long term (current) drug therapy: Secondary | ICD-10-CM

## 2024-03-18 DIAGNOSIS — I10 Essential (primary) hypertension: Secondary | ICD-10-CM

## 2024-03-18 DIAGNOSIS — E860 Dehydration: Secondary | ICD-10-CM | POA: Diagnosis present

## 2024-03-18 DIAGNOSIS — I2489 Other forms of acute ischemic heart disease: Secondary | ICD-10-CM | POA: Diagnosis present

## 2024-03-18 DIAGNOSIS — R7989 Other specified abnormal findings of blood chemistry: Secondary | ICD-10-CM

## 2024-03-18 DIAGNOSIS — I4891 Unspecified atrial fibrillation: Secondary | ICD-10-CM | POA: Diagnosis not present

## 2024-03-18 DIAGNOSIS — E039 Hypothyroidism, unspecified: Secondary | ICD-10-CM | POA: Diagnosis present

## 2024-03-18 DIAGNOSIS — I6521 Occlusion and stenosis of right carotid artery: Secondary | ICD-10-CM | POA: Diagnosis present

## 2024-03-18 DIAGNOSIS — R1310 Dysphagia, unspecified: Secondary | ICD-10-CM | POA: Diagnosis present

## 2024-03-18 DIAGNOSIS — D649 Anemia, unspecified: Secondary | ICD-10-CM

## 2024-03-18 DIAGNOSIS — D631 Anemia in chronic kidney disease: Secondary | ICD-10-CM | POA: Diagnosis present

## 2024-03-18 DIAGNOSIS — K9423 Gastrostomy malfunction: Secondary | ICD-10-CM | POA: Diagnosis present

## 2024-03-18 DIAGNOSIS — R64 Cachexia: Secondary | ICD-10-CM | POA: Diagnosis present

## 2024-03-18 DIAGNOSIS — Z515 Encounter for palliative care: Secondary | ICD-10-CM | POA: Diagnosis not present

## 2024-03-18 DIAGNOSIS — J188 Other pneumonia, unspecified organism: Secondary | ICD-10-CM

## 2024-03-18 DIAGNOSIS — Z8673 Personal history of transient ischemic attack (TIA), and cerebral infarction without residual deficits: Secondary | ICD-10-CM | POA: Diagnosis not present

## 2024-03-18 DIAGNOSIS — I5A Non-ischemic myocardial injury (non-traumatic): Secondary | ICD-10-CM | POA: Diagnosis present

## 2024-03-18 DIAGNOSIS — H9190 Unspecified hearing loss, unspecified ear: Secondary | ICD-10-CM | POA: Diagnosis present

## 2024-03-18 DIAGNOSIS — R6520 Severe sepsis without septic shock: Secondary | ICD-10-CM | POA: Diagnosis present

## 2024-03-18 DIAGNOSIS — R54 Age-related physical debility: Secondary | ICD-10-CM | POA: Diagnosis present

## 2024-03-18 DIAGNOSIS — I5022 Chronic systolic (congestive) heart failure: Secondary | ICD-10-CM | POA: Diagnosis present

## 2024-03-18 DIAGNOSIS — I440 Atrioventricular block, first degree: Secondary | ICD-10-CM | POA: Diagnosis present

## 2024-03-18 DIAGNOSIS — N138 Other obstructive and reflux uropathy: Secondary | ICD-10-CM | POA: Diagnosis present

## 2024-03-18 DIAGNOSIS — J189 Pneumonia, unspecified organism: Secondary | ICD-10-CM | POA: Diagnosis present

## 2024-03-18 DIAGNOSIS — I2510 Atherosclerotic heart disease of native coronary artery without angina pectoris: Secondary | ICD-10-CM | POA: Diagnosis not present

## 2024-03-18 DIAGNOSIS — N401 Enlarged prostate with lower urinary tract symptoms: Secondary | ICD-10-CM | POA: Diagnosis not present

## 2024-03-18 DIAGNOSIS — F1021 Alcohol dependence, in remission: Secondary | ICD-10-CM | POA: Diagnosis present

## 2024-03-18 DIAGNOSIS — J690 Pneumonitis due to inhalation of food and vomit: Secondary | ICD-10-CM | POA: Diagnosis present

## 2024-03-18 DIAGNOSIS — E872 Acidosis, unspecified: Secondary | ICD-10-CM | POA: Diagnosis present

## 2024-03-18 DIAGNOSIS — N184 Chronic kidney disease, stage 4 (severe): Secondary | ICD-10-CM | POA: Diagnosis not present

## 2024-03-18 DIAGNOSIS — E1122 Type 2 diabetes mellitus with diabetic chronic kidney disease: Secondary | ICD-10-CM | POA: Diagnosis present

## 2024-03-18 DIAGNOSIS — N189 Chronic kidney disease, unspecified: Secondary | ICD-10-CM | POA: Diagnosis not present

## 2024-03-18 DIAGNOSIS — Z66 Do not resuscitate: Secondary | ICD-10-CM | POA: Diagnosis present

## 2024-03-18 DIAGNOSIS — I209 Angina pectoris, unspecified: Secondary | ICD-10-CM

## 2024-03-18 DIAGNOSIS — Z9049 Acquired absence of other specified parts of digestive tract: Secondary | ICD-10-CM

## 2024-03-18 DIAGNOSIS — Z808 Family history of malignant neoplasm of other organs or systems: Secondary | ICD-10-CM

## 2024-03-18 DIAGNOSIS — F0150 Vascular dementia without behavioral disturbance: Secondary | ICD-10-CM | POA: Diagnosis present

## 2024-03-18 DIAGNOSIS — R531 Weakness: Secondary | ICD-10-CM

## 2024-03-18 DIAGNOSIS — E0590 Thyrotoxicosis, unspecified without thyrotoxic crisis or storm: Secondary | ICD-10-CM | POA: Diagnosis present

## 2024-03-18 DIAGNOSIS — Z1152 Encounter for screening for COVID-19: Secondary | ICD-10-CM | POA: Diagnosis not present

## 2024-03-18 DIAGNOSIS — R791 Abnormal coagulation profile: Secondary | ICD-10-CM | POA: Diagnosis present

## 2024-03-18 DIAGNOSIS — I129 Hypertensive chronic kidney disease with stage 1 through stage 4 chronic kidney disease, or unspecified chronic kidney disease: Secondary | ICD-10-CM | POA: Diagnosis not present

## 2024-03-18 DIAGNOSIS — I130 Hypertensive heart and chronic kidney disease with heart failure and stage 1 through stage 4 chronic kidney disease, or unspecified chronic kidney disease: Secondary | ICD-10-CM | POA: Diagnosis present

## 2024-03-18 DIAGNOSIS — I452 Bifascicular block: Secondary | ICD-10-CM | POA: Diagnosis present

## 2024-03-18 DIAGNOSIS — E875 Hyperkalemia: Secondary | ICD-10-CM | POA: Diagnosis present

## 2024-03-18 DIAGNOSIS — K219 Gastro-esophageal reflux disease without esophagitis: Secondary | ICD-10-CM | POA: Diagnosis present

## 2024-03-18 DIAGNOSIS — E1165 Type 2 diabetes mellitus with hyperglycemia: Secondary | ICD-10-CM | POA: Diagnosis present

## 2024-03-18 DIAGNOSIS — Z87891 Personal history of nicotine dependence: Secondary | ICD-10-CM

## 2024-03-18 DIAGNOSIS — E43 Unspecified severe protein-calorie malnutrition: Secondary | ICD-10-CM | POA: Diagnosis present

## 2024-03-18 DIAGNOSIS — E785 Hyperlipidemia, unspecified: Secondary | ICD-10-CM | POA: Diagnosis present

## 2024-03-18 DIAGNOSIS — R627 Adult failure to thrive: Secondary | ICD-10-CM | POA: Diagnosis present

## 2024-03-18 DIAGNOSIS — R053 Chronic cough: Secondary | ICD-10-CM | POA: Diagnosis present

## 2024-03-18 DIAGNOSIS — Z7982 Long term (current) use of aspirin: Secondary | ICD-10-CM

## 2024-03-18 DIAGNOSIS — E1151 Type 2 diabetes mellitus with diabetic peripheral angiopathy without gangrene: Secondary | ICD-10-CM | POA: Diagnosis present

## 2024-03-18 DIAGNOSIS — J9601 Acute respiratory failure with hypoxia: Secondary | ICD-10-CM | POA: Diagnosis not present

## 2024-03-18 DIAGNOSIS — Z833 Family history of diabetes mellitus: Secondary | ICD-10-CM

## 2024-03-18 DIAGNOSIS — Z96642 Presence of left artificial hip joint: Secondary | ICD-10-CM | POA: Diagnosis present

## 2024-03-18 DIAGNOSIS — Z6822 Body mass index (BMI) 22.0-22.9, adult: Secondary | ICD-10-CM

## 2024-03-18 LAB — PROCALCITONIN: Procalcitonin: 2.16 ng/mL

## 2024-03-18 LAB — RESP PANEL BY RT-PCR (RSV, FLU A&B, COVID)  RVPGX2
Influenza A by PCR: NEGATIVE
Influenza B by PCR: NEGATIVE
Resp Syncytial Virus by PCR: NEGATIVE
SARS Coronavirus 2 by RT PCR: NEGATIVE

## 2024-03-18 LAB — BASIC METABOLIC PANEL WITH GFR
Anion gap: 15 (ref 5–15)
Anion gap: 10 (ref 5–15)
BUN: 71 mg/dL — ABNORMAL HIGH (ref 8–23)
BUN: 46 mg/dL — ABNORMAL HIGH (ref 8–23)
CO2: 12 mmol/L — ABNORMAL LOW (ref 22–32)
CO2: 8 mmol/L — ABNORMAL LOW (ref 22–32)
Calcium: 8.2 mg/dL — ABNORMAL LOW (ref 8.9–10.3)
Calcium: 4.8 mg/dL — CL (ref 8.9–10.3)
Chloride: 93 mmol/L — ABNORMAL LOW (ref 98–111)
Chloride: 115 mmol/L — ABNORMAL HIGH (ref 98–111)
Creatinine, Ser: 2.69 mg/dL — ABNORMAL HIGH (ref 0.61–1.24)
Creatinine, Ser: 1.56 mg/dL — ABNORMAL HIGH (ref 0.61–1.24)
GFR, Estimated: 22 mL/min — ABNORMAL LOW (ref >60)
GFR, Estimated: 42 mL/min — ABNORMAL LOW (ref >60)
Glucose, Bld: 209 mg/dL — ABNORMAL HIGH (ref 70–99)
Glucose, Bld: 130 mg/dL — ABNORMAL HIGH (ref 70–99)
Potassium: 5.3 mmol/L — ABNORMAL HIGH (ref 3.5–5.1)
Potassium: 3.1 mmol/L — ABNORMAL LOW (ref 3.5–5.1)
Sodium: 120 mmol/L — ABNORMAL LOW (ref 135–145)
Sodium: 133 mmol/L — ABNORMAL LOW (ref 135–145)

## 2024-03-18 LAB — CBC WITH DIFFERENTIAL/PLATELET
Abs Immature Granulocytes: 0.15 K/uL — ABNORMAL HIGH (ref 0.00–0.07)
Basophils Absolute: 0 K/uL (ref 0.0–0.1)
Basophils Relative: 0 %
Eosinophils Absolute: 0 K/uL (ref 0.0–0.5)
Eosinophils Relative: 0 %
HCT: 28.6 % — ABNORMAL LOW (ref 39.0–52.0)
Hemoglobin: 9.6 g/dL — ABNORMAL LOW (ref 13.0–17.0)
Immature Granulocytes: 1 %
Lymphocytes Relative: 2 %
Lymphs Abs: 0.3 K/uL — ABNORMAL LOW (ref 0.7–4.0)
MCH: 29.8 pg (ref 26.0–34.0)
MCHC: 33.6 g/dL (ref 30.0–36.0)
MCV: 88.8 fL (ref 80.0–100.0)
Monocytes Absolute: 0.6 K/uL (ref 0.1–1.0)
Monocytes Relative: 4 %
Neutro Abs: 13.7 K/uL — ABNORMAL HIGH (ref 1.7–7.7)
Neutrophils Relative %: 93 %
Platelets: 363 K/uL (ref 150–400)
RBC: 3.22 MIL/uL — ABNORMAL LOW (ref 4.22–5.81)
RDW: 12.9 % (ref 11.5–15.5)
WBC: 14.7 K/uL — ABNORMAL HIGH (ref 4.0–10.5)
nRBC: 0 % (ref 0.0–0.2)

## 2024-03-18 LAB — PROTIME-INR
INR: 1.2 (ref 0.8–1.2)
Prothrombin Time: 16.3 s — ABNORMAL HIGH (ref 11.4–15.2)

## 2024-03-18 LAB — COMPREHENSIVE METABOLIC PANEL WITH GFR
ALT: 11 U/L (ref 0–44)
AST: 29 U/L (ref 15–41)
Albumin: 3.7 g/dL (ref 3.5–5.0)
Alkaline Phosphatase: 104 U/L (ref 38–126)
Anion gap: 15 (ref 5–15)
BUN: 72 mg/dL — ABNORMAL HIGH (ref 8–23)
CO2: 13 mmol/L — ABNORMAL LOW (ref 22–32)
Calcium: 9 mg/dL (ref 8.9–10.3)
Chloride: 89 mmol/L — ABNORMAL LOW (ref 98–111)
Creatinine, Ser: 2.81 mg/dL — ABNORMAL HIGH (ref 0.61–1.24)
GFR, Estimated: 21 mL/min — ABNORMAL LOW (ref >60)
Glucose, Bld: 213 mg/dL — ABNORMAL HIGH (ref 70–99)
Potassium: 5.4 mmol/L — ABNORMAL HIGH (ref 3.5–5.1)
Sodium: 117 mmol/L — CL (ref 135–145)
Total Bilirubin: 0.5 mg/dL (ref 0.0–1.2)
Total Protein: 7.4 g/dL (ref 6.5–8.1)

## 2024-03-18 LAB — CBG MONITORING, ED
Glucose-Capillary: 214 mg/dL — ABNORMAL HIGH (ref 70–99)
Glucose-Capillary: 184 mg/dL — ABNORMAL HIGH (ref 70–99)
Glucose-Capillary: 149 mg/dL — ABNORMAL HIGH (ref 70–99)

## 2024-03-18 LAB — OSMOLALITY: Osmolality: 291 mosm/kg (ref 275–295)

## 2024-03-18 LAB — I-STAT CG4 LACTIC ACID, ED: Lactic Acid, Venous: 2 mmol/L (ref 0.5–1.9)

## 2024-03-18 LAB — TROPONIN T, HIGH SENSITIVITY
Troponin T High Sensitivity: 208 ng/L (ref 0–19)
Troponin T High Sensitivity: 217 ng/L (ref 0–19)

## 2024-03-18 MED ORDER — LIFITEGRAST 5 % OP SOLN: 1.0000 [drp] | Freq: Two times a day (BID) | OPHTHALMIC | Status: DC

## 2024-03-18 MED ORDER — PREDNISOLONE ACETATE 1 % OP SUSP
1.0000 [drp] | Freq: Every day | OPHTHALMIC | Status: DC
  Administered 2024-03-19 – 2024-03-23 (×5): 1 [drp] via OPHTHALMIC
  Filled 2024-03-18: qty 5

## 2024-03-18 MED ORDER — ATORVASTATIN CALCIUM 40 MG PO TABS
40.0000 mg | ORAL_TABLET | Freq: Every day | ORAL | Status: DC
  Administered 2024-03-18 – 2024-03-22 (×4): 40 mg via ORAL
  Filled 2024-03-18 (×4): qty 1

## 2024-03-18 MED ORDER — ARTIFICIAL TEARS OPHTHALMIC OINT
TOPICAL_OINTMENT | Freq: Two times a day (BID) | OPHTHALMIC | Status: DC
  Administered 2024-03-19: 1 via OPHTHALMIC
  Filled 2024-03-18: qty 3.5

## 2024-03-18 MED ORDER — SODIUM CHLORIDE 0.9 % IV SOLN
500.0000 mg | Freq: Once | INTRAVENOUS | Status: AC
  Administered 2024-03-18: 16:00:00 500 mg via INTRAVENOUS
  Filled 2024-03-18: qty 5

## 2024-03-18 MED ORDER — METHIMAZOLE 5 MG PO TABS
5.0000 mg | ORAL_TABLET | Freq: Every day | ORAL | Status: DC
  Administered 2024-03-19 – 2024-03-23 (×4): 5 mg via ORAL
  Filled 2024-03-18 (×5): qty 1

## 2024-03-18 MED ORDER — ACETAMINOPHEN 650 MG RE SUPP: 650.0000 mg | Freq: Four times a day (QID) | RECTAL | Status: DC | PRN

## 2024-03-18 MED ORDER — INSULIN ASPART 100 UNIT/ML IJ SOLN
0.0000 [IU] | INTRAMUSCULAR | Status: DC
  Administered 2024-03-22: 2 [IU] via SUBCUTANEOUS
  Administered 2024-03-22: 5 [IU] via SUBCUTANEOUS
  Administered 2024-03-22: 1 [IU] via SUBCUTANEOUS
  Administered 2024-03-22: 9 [IU] via SUBCUTANEOUS
  Administered 2024-03-23: 1 [IU] via SUBCUTANEOUS
  Filled 2024-03-18 (×2): qty 2
  Filled 2024-03-18: qty 9
  Filled 2024-03-18: qty 1
  Filled 2024-03-18: qty 5
  Filled 2024-03-18 (×3): qty 1
  Filled 2024-03-18 (×2): qty 2
  Filled 2024-03-18: qty 5

## 2024-03-18 MED ORDER — BRIMONIDINE TARTRATE 0.2 % OP SOLN
1.0000 [drp] | Freq: Two times a day (BID) | OPHTHALMIC | Status: DC
  Administered 2024-03-18 – 2024-03-23 (×11): 1 [drp] via OPHTHALMIC
  Filled 2024-03-18: qty 5

## 2024-03-18 MED ORDER — SODIUM CHLORIDE 0.9 % IV SOLN
2.0000 g | Freq: Once | INTRAVENOUS | Status: AC
  Administered 2024-03-18: 15:00:00 2 g via INTRAVENOUS
  Filled 2024-03-18: qty 20

## 2024-03-18 MED ORDER — ACETAMINOPHEN 325 MG PO TABS
650.0000 mg | ORAL_TABLET | Freq: Four times a day (QID) | ORAL | Status: DC | PRN
  Administered 2024-03-23: 650 mg
  Filled 2024-03-18 (×2): qty 2

## 2024-03-18 MED ORDER — TEARS AGAIN OP OINT: 1.0000 | TOPICAL_OINTMENT | Freq: Two times a day (BID) | OPHTHALMIC | Status: DC

## 2024-03-18 MED ORDER — LACTATED RINGERS IV SOLN: INTRAVENOUS | Status: DC

## 2024-03-18 MED ORDER — ASPIRIN 81 MG PO TBEC
81.0000 mg | DELAYED_RELEASE_TABLET | Freq: Every day | ORAL | Status: DC
  Administered 2024-03-19 – 2024-03-23 (×4): 81 mg via ORAL
  Filled 2024-03-18 (×5): qty 1

## 2024-03-18 MED ORDER — SODIUM CHLORIDE 0.9 % IV SOLN
2.0000 g | INTRAVENOUS | Status: DC
  Administered 2024-03-19 – 2024-03-23 (×5): 2 g via INTRAVENOUS
  Filled 2024-03-18 (×5): qty 20

## 2024-03-18 MED ORDER — SODIUM CHLORIDE 0.9 % IV SOLN
500.0000 mg | INTRAVENOUS | Status: DC
  Administered 2024-03-19 – 2024-03-23 (×5): 500 mg via INTRAVENOUS
  Filled 2024-03-18 (×5): qty 5

## 2024-03-18 MED ORDER — SODIUM CHLORIDE 0.9 % IV BOLUS
1000.0000 mL | Freq: Once | INTRAVENOUS | Status: AC
  Administered 2024-03-18: 15:00:00 1000 mL via INTRAVENOUS

## 2024-03-18 MED ORDER — LATANOPROST 0.005 % OP SOLN
1.0000 [drp] | Freq: Every day | OPHTHALMIC | Status: DC
  Administered 2024-03-18 – 2024-03-23 (×6): 1 [drp] via OPHTHALMIC
  Filled 2024-03-18: qty 2.5

## 2024-03-18 MED ORDER — BRINZOLAMIDE 1 % OP SUSP
1.0000 [drp] | Freq: Two times a day (BID) | OPHTHALMIC | Status: DC
  Administered 2024-03-18 – 2024-03-23 (×11): 1 [drp] via OPHTHALMIC
  Filled 2024-03-18 (×2): qty 10

## 2024-03-18 MED ORDER — LACTATED RINGERS IV BOLUS (SEPSIS): 1000.0000 mL | Freq: Once | INTRAVENOUS | Status: DC

## 2024-03-18 MED ORDER — HEPARIN SODIUM (PORCINE) 5000 UNIT/ML IJ SOLN
5000.0000 [IU] | Freq: Three times a day (TID) | INTRAMUSCULAR | Status: DC
  Administered 2024-03-18 – 2024-03-19 (×2): 5000 [IU] via SUBCUTANEOUS
  Filled 2024-03-18 (×2): qty 1

## 2024-03-18 MED ORDER — SODIUM CHLORIDE 0.9% FLUSH
3.0000 mL | Freq: Two times a day (BID) | INTRAVENOUS | Status: DC
  Administered 2024-03-18 – 2024-03-23 (×12): 3 mL via INTRAVENOUS

## 2024-03-18 MED ADMIN — Morphine Sulfate IV Soln PF 2 MG/ML: 2 mg | INTRAVENOUS | @ 16:00:00 | NDC 63323045200

## 2024-03-18 MED ADMIN — Lactated Ringer's Solution: 500 mL | INTRAVENOUS | @ 13:00:00 | NDC 00338011703

## 2024-03-18 MED ADMIN — Benzonatate Cap 100 MG: 100 mg | ORAL | @ 16:00:00 | NDC 42806071401

## 2024-03-18 MED FILL — Benzonatate Cap 100 MG: 100.0000 mg | ORAL | Qty: 1 | Status: AC

## 2024-03-18 MED FILL — Morphine Sulfate IV Soln PF 2 MG/ML: 2.0000 mg | INTRAVENOUS | Qty: 1 | Status: AC

## 2024-03-18 NOTE — Progress Notes (Signed)
° °  Brief Progress Note   _____________________________________________________________________________________________________________  Patient Name: John Walton Patient DOB: 01/17/1933 Date: @TODAY @      Data: Reviewed vital signs, labs, and clinical notes.    Action: No action required at this time.    Response:  Na 117. Awaiting bed assignment.  _____________________________________________________________________________________________________________  [facility] RN Expeditor Kirstin Kugler S Raihana Balderrama Please contact us  directly via secure chat (search for [facility]) or by calling us  at [phone] [facility]).

## 2024-03-18 NOTE — ED Notes (Signed)
 Patient transported to X-ray

## 2024-03-18 NOTE — ED Notes (Signed)
 Patient reports that he will refuse all insulin  as he manages his BG at home with diet and exercise. Does not want to start taking insulin . Educated on risk of hyperglycemia if not corrected.

## 2024-03-18 NOTE — Consult Note (Signed)
 Cardiology Consultation   Patient ID: [name] MRN: [id number]; DOB: 11/07/1932  Admit date: 03/18/2024 Date of Consult: 03/18/2024  PCP:  John Ryan Askew, MD   [facility] Providers Cardiologist:  Candyce Reek, MD        Patient Profile: John Walton is a 89 y.o. male with a hx of AS, mitral stenosis, debility who is being seen 03/18/2024 for the evaluation of elevated troponin at the request of Dr. Dasie.  History of Present Illness: John Walton is an 89 year old patient with DNR, chronic G-tube, low-flow low gradient severe aortic stenosis, moderate mitral stenosis, mildly reduced ejection fraction 40 to 45% admitted with weakness lethargy found to be severe hyponatremia, lactic acidosis, multilobar pneumonia.  Troponin was drawn and was in the 200 range.  EKG shows chronic right bundle branch block and left anterior fascicular block with sinus tachycardia rate 105 with no ischemic changes.  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) 04/01/2020   Sep 12, 2023 Entered By: DARWIN ANES D Comment: Possibly right middle cerebral artery with left hemiparesis and dysphagia   Sep 12, 2023 Entered By: DARWIN ANES D Comment: PEG tube is placed but not used     Chicken pox    Chronic kidney disease    Coronary artery disease    Diabetes mellitus without complication (HCC)    Frequent headaches    Glaucoma    Hay fever    History of blood transfusion    HTN (hypertension)    Hyperlipidemia    Hyperlipidemia    Hypothyroidism    Migraines    Peripheral vascular disease    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(HCC)    Stroke-like symptoms     Past Surgical History:   Procedure Laterality Date   CHOLECYSTECTOMY, LAPAROSCOPIC     ENDARTERECTOMY Right 12/22/2020   Procedure: RIGHT CAROTID ENDARTERECTOMY;  Surgeon: Eliza Lonni RAMAN, MD;  Location: [facility] OR;  Service: Vascular;  Laterality: Right;   IR GASTROSTOMY TUBE MOD SED  03/29/2020   TONSILLECTOMY  04/01/1940   TOTAL HIP ARTHROPLASTY Left 03/08/2020   Procedure: TOTAL HIP ARTHROPLASTY;  Surgeon: Barbarann Anes BROCKS, MD;  Location: [facility] ORS;  Service: Orthopedics;  Laterality: Left;   TRANSURETHRAL RESECTION OF PROSTATE N/A 12/14/2020   Procedure: TRANSURETHRAL RESECTION OF THE PROSTATE (TURP);  Surgeon: Matilda Senior, MD;  Location: [facility] ORS;  Service: Urology;  Laterality: N/A;  2 HRS     Home Medications:  Prior to Admission medications  Medication Sig Start Date End Date Taking? Authorizing Provider  acetaminophen  (TYLENOL ) 500 MG tablet Take 500 mg by mouth as needed for pain.   Yes [provider]  aspirin  EC 81 MG tablet Take 1 tablet (81 mg total) by mouth daily. 12/26/20  Yes Singh, Prashant K, MD  atorvastatin  (LIPITOR) 40 MG tablet Take 40 mg by mouth at bedtime.   Yes [provider]  Calcium  Carb-Cholecalciferol  (OYSTER SHELL CALCIUM  W/D) 500-5 MG-MCG TABS Take 1 tablet by mouth daily. 10/31/20  Yes [provider]  carboxymethylcellulose (REFRESH PLUS) 0.5 % SOLN Place 1 drop into both eyes every hour while awake.   Yes [provider]  Ensure (ENSURE) Take 237 mLs by mouth  3 (three) times daily between meals.   Yes [provider]  ferrous sulfate  325 (65 FE) MG tablet Take 1 tablet (325 mg total) by mouth daily with breakfast. 12/26/20  Yes Singh, Prashant K, MD  latanoprost  (XALATAN ) 0.005 % ophthalmic solution Place 1 drop into both eyes at bedtime.  03/13/19  Yes [provider]  Lifitegrast  (XIIDRA ) 5 % SOLN Place 1 drop into both eyes in the morning and at bedtime.   Yes [provider]  loperamide  (IMODIUM ) 2 MG capsule Take 2  mg by mouth as needed for diarrhea or loose stools.   Yes [provider]  methimazole  (TAPAZOLE ) 5 MG tablet Take 5 mg by mouth daily. 04/07/23  Yes [provider]  mupirocin  ointment (BACTROBAN ) 2 % Place 1 Application into the nose 2 (two) times daily. Patient taking differently: Apply 1 Application topically 2 (two) times daily. 06/20/23  Yes Will Almarie MATSU, MD  Nutritional Supplements (FEEDING SUPPLEMENT, OSMOLITE 1.5 CAL,) LIQD Place 237 mLs into feeding tube continuous. 04/03/20  Yes [provider]  Nutritional Supplements (PROSOURCE) LIQD Give 45 mLs by tube in the morning and at bedtime.   Yes [provider]  prednisoLONE  acetate (PRED FORTE ) 1 % ophthalmic suspension Place 1 drop into the left eye daily. 05/13/23  Yes [provider]  SIMBRINZA 1-0.2 % SUSP Place 1 drop into both eyes in the morning and at bedtime.  05/16/19  Yes [provider]  Varenicline  Tartrate 0.03 MG/ACT SOLN Place 1 spray into both nostrils 2 (two) times daily. 06/16/23  Yes [provider]  vitamin B-12 (CYANOCOBALAMIN ) 500 MCG tablet Take 1 tablet by mouth 2 (two) times daily. 01/03/21  Yes [provider]  Water  For Irrigation, Sterile (FREE WATER ) SOLN Place 120 mLs into feeding tube 5 (five) times daily. 04/03/20  Yes Cheryle Page, MD  White Petrolatum -Mineral Oil (TEARS AGAIN) OINT Place 1 application  into both eyes in the morning and at bedtime. Refresh PM   Yes [provider]    Scheduled Meds:  Continuous Infusions:  azithromycin      sodium chloride      PRN Meds:   Allergies:   Allergies[1]  Social History:   Social History   Socioeconomic History   Marital status: Married    Spouse name: Not on file   Number of children: Not on file   Years of education: Not on file   Highest education level: Not on file  Occupational History   Not on file  Tobacco Use   Smoking status: Former    Current packs/day:  0.00    Types: Cigarettes    Start date: 04/01/1968    Quit date: 01/11/1977    Years since quitting: 47.2   Smokeless tobacco: Never  Vaping Use   Vaping status: Never Used  Substance and Sexual Activity   Alcohol  use: Not Currently   Drug use: No   Sexual activity: Never  Other Topics Concern   Not on file  Social History Narrative   Not on file   Social Drivers of Health   Tobacco Use: Medium Risk (02/11/2024)   Patient History    Smoking Tobacco Use: Former    Smokeless Tobacco Use: Never    Passive Exposure: Not on Actuary Strain: Not on file  Food Insecurity: No Food Insecurity (01/19/2024)   Epic    Worried About Radiation Protection Practitioner of Food in the Last Year: Never true    Ran Out  of Food in the Last Year: Never true  Transportation Needs: No Transportation Needs (01/19/2024)   Epic    Lack of Transportation (Medical): No    Lack of Transportation (Non-Medical): No  Physical Activity: Not on file  Stress: Not on file  Social Connections: Patient Declined (01/19/2024)   Social Connection and Isolation Panel    Frequency of Communication with Friends and Family: Patient declined    Frequency of Social Gatherings with Friends and Family: Patient declined    Attends Religious Services: Patient declined    Database Administrator or Organizations: Patient declined    Attends Banker Meetings: Patient declined    Marital Status: Patient declined  Intimate Partner Violence: Not At Risk (01/19/2024)   Epic    Fear of Current or Ex-Partner: No    Emotionally Abused: No    Physically Abused: No    Sexually Abused: No  Depression (PHQ2-9): Not on file  Alcohol  Screen: Not on file  Housing: Unknown (01/19/2024)   Epic    Unable to Pay for Housing in the Last Year: Patient declined    Number of Times Moved in the Last Year: 0    Homeless in the Last Year: Patient declined  Utilities: Not At Risk (01/19/2024)   Epic    Threatened with loss of  utilities: No  Health Literacy: Not on file    Family History:    Family History  Problem Relation Age of Onset   Liver disease Mother    Skin cancer Father    Skin cancer Paternal Grandfather    Diabetes Maternal Grandfather      ROS:  Please see the history of present illness.  Currently challenging to obtain a clear history.  Getting most of it from his son-in-law. All other ROS reviewed and negative.     Physical Exam/Data: Vitals:   03/18/24 1152 03/18/24 1315 03/18/24 1415  BP: (!) 105/54 112/64 121/67  Pulse: (!) 108 (!) 105 (!) 106  Resp: 19 (!) 29 (!) 30  Temp: 98.3 F (36.8 C) 98.1 F (36.7 C)   TempSrc: Oral Oral   SpO2: 100% 97% 90%   No intake or output data in the 24 hours ending 03/18/24 1520    01/19/2024    3:33 PM 01/17/2024    7:15 PM 06/17/2023    6:26 PM  Last 3 Weights  Weight (lbs) 156 lb 12 oz 154 lb 5.2 oz 152 lb  Weight (kg) 71.1 kg 70 kg 68.947 kg     There is no height or weight on file to calculate BMI.  General: Fairly thin, disheveled elderly ill-appearing HEENT: normal Neck: no JVD Vascular: No carotid bruits; Distal pulses 2+ bilaterally Cardiac:  normal S1, S2; tachycardic regular; 3/6 systolic murmur Lungs: Rhonchi bilaterally  abd: soft, nontender, no hepatomegaly, G-tube in place Ext: no edema Musculoskeletal:  No deformities, BUE and BLE strength normal and equal Skin: warm and dry  Neuro:  CNs 2-12 intact, no focal abnormalities noted Psych: Difficult to ascertain  EKG:  The EKG was personally reviewed and demonstrates: Right bundle branch block left intrafascicular block sinus rhythm first-degree AV block Telemetry:  Telemetry was personally reviewed and demonstrates: No arrhythmia  Relevant CV Studies: As above  Laboratory Data: High Sensitivity Troponin:  No results for input(s): TROPONINIHS in the last 720 hours.  Recent Labs  Lab 03/18/24 1320  TRNPT 208*      Chemistry Recent Labs  Lab 03/18/24 1320   NA  117*  K 5.4*  CL 89*  CO2 13*  GLUCOSE 213*  BUN 72*  CREATININE 2.81*  CALCIUM  9.0  GFRNONAA 21*  ANIONGAP 15    Recent Labs  Lab 03/18/24 1320  PROT 7.4  ALBUMIN  3.7  AST 29  ALT 11  ALKPHOS 104  BILITOT 0.5   Lipids No results for input(s): CHOL, TRIG, HDL, LABVLDL, LDLCALC, CHOLHDL in the last 168 hours.  Hematology Recent Labs  Lab 03/18/24 1320  WBC 14.7*  RBC 3.22*  HGB 9.6*  HCT 28.6*  MCV 88.8  MCH 29.8  MCHC 33.6  RDW 12.9  PLT 363   Thyroid  No results for input(s): TSH, FREET4 in the last 168 hours.  BNPNo results for input(s): BNP, PROBNP in the last 168 hours.  DDimer No results for input(s): DDIMER in the last 168 hours.  Radiology/Studies:  DG Chest 2 View Result Date: 03/18/2024 EXAM: 2 VIEW(S) XRAY OF THE CHEST 03/18/2024 01:55:00 PM COMPARISON: 02/11/2024 CLINICAL HISTORY: cough and shortness of breath FINDINGS: LUNGS AND PLEURA: Patchy opacities in right mid lung and left upper lung. Trace right pleural effusion. No pneumothorax. HEART AND MEDIASTINUM: Aortic atherosclerotic calcification. No acute abnormality of the cardiac silhouette. BONES AND SOFT TISSUES: No acute osseous abnormality. IMPRESSION: 1. Patchy opacities in right mid lung and left upper lung. Compatible with multifocal pneumonia. 2. Trace right pleural effusion. Electronically signed by: Waddell Calk MD 03/18/2024 02:16 PM EST RP Workstation: [id number]     Assessment and Plan:  89 year old with elevated troponin in the setting of increased weakness poor p.o. intake with G-tube in place malaise for the past 3 days with productive cough and workup compatible with pneumonia.  Has underlying significant aortic valvular disease as well as mitral valvular disease followed by Dr. Waymond at [facility]  Elevated troponin Moderate mitral stenosis Low-flow low gradient severe aortic stenosis -Followed by Dr. Waymond at [facility]. - Troponin level is 208, which is  representative of myocardial injury in the setting of metabolic derangement, pneumonia, lactic acidosis, acute kidney injury.  No further cardiac workup or ischemic workup is necessary. -Recent echocardiogram on 01/19/2024 shows moderate mitral stenosis as well as low-flow low gradient severe aortic valve stenosis.  EF is 40 to 45%.  Continue with supportive care.  Unfortunately not a surgical candidate.  PAD - Status post right carotid endarterectomy.  Stable.  Chronic kidney disease stage IV - Continuing to monitor.  Avoid nephrotoxic agents.  Chronic G-tube - Has been present since January 2023  Multilobar pneumonia - Probable in part from chronic aspiration. ABX per primary team.   Chronically debilitated. Hyponatremia - Severe.  Fluid resuscitation.  Please let us  know if we can be of further assistance for you.  Continue with resuscitative efforts in the setting of hyponatremia and multilobar pneumonia.  Risk Assessment/Risk Scores:          For questions or updates, please contact [facility] Please consult [email or web address] for contact info under      Signed, Oneil Parchment, MD  03/18/2024 3:20 PM     [1]  Allergies Allergen Reactions   Other Swelling and Other (See Comments)    ALL COUGH MEDICATIONS   Influenza Vaccines Other (See Comments)    Unknown   Robitussin Cold Cough+ Chest [Dextromethorphan -Guaifenesin ] Swelling and Other (See Comments)    Laryngeal edema - any cough syrup; Wife states Robitussin and ANY cough syrup closes patient's throat

## 2024-03-18 NOTE — ED Triage Notes (Signed)
 Pt arrives via [facility] from home c/o increased weakness, poor PO intake (pt has g-tube in place), malaise for the last three days. Pt with strong, productive cough in triage. Denies CP, SOB, fevers.   EMS last VS HR 108, 150/90, 95% on RA, CBG 274

## 2024-03-18 NOTE — ED Provider Notes (Cosign Needed)
 [place] [facility] AT [facility] Provider Note   CSN: [id number] Arrival date & time: 03/18/24  1143     Patient presents with: Weakness   CASANOVA SCHURMAN is a 89 y.o. male.   Weakness Associated symptoms: cough and shortness of breath    Patient is a an 89 year old male presenting ED today from home with concerns for increased generalized weakness, poor p.o. intake and malaise with strong, productive cough.  Noted to be producing white sputum. Reports cough worsening over a long time.  Reports that he came in today because he needed help getting out of bed, for which he called EMS.  Noting that he has had decreased intake because his wife who he lives with is out of town visiting his daughter in [place].  Previous medical history of HTN, HLD, CKD stage IV, type 2 diabetes migraines, CAD, anemia requiring blood transfusion, status post G-tube  Son-in-law at bedside he said that he is esophageal bone spurs, noting that he has been seen by the [facility] in [place].  And has had progressive symptoms for the last 3 days.  Denies fever, headache, vision changes, chest pain, hemoptysis, nausea, vomiting, diarrhea, dysuria, melena, hematochezia, lower leg swelling.     Prior to Admission medications  Medication Sig Start Date End Date Taking? Authorizing Provider  acetaminophen  (TYLENOL ) 500 MG tablet Take 500 mg by mouth as needed for pain.   Yes [provider]  aspirin  EC 81 MG tablet Take 1 tablet (81 mg total) by mouth daily. 12/26/20  Yes Singh, Prashant K, MD  atorvastatin  (LIPITOR) 40 MG tablet Take 40 mg by mouth at bedtime.   Yes [provider]  Calcium  Carb-Cholecalciferol  (OYSTER SHELL CALCIUM  W/D) 500-5 MG-MCG TABS Take 1 tablet by mouth daily. 10/31/20  Yes [provider]  carboxymethylcellulose (REFRESH PLUS) 0.5 % SOLN Place 1 drop into both eyes every hour while awake.   Yes [provider]  Ensure (ENSURE)  Take 237 mLs by mouth 3 (three) times daily between meals.   Yes [provider]  ferrous sulfate  325 (65 FE) MG tablet Take 1 tablet (325 mg total) by mouth daily with breakfast. 12/26/20  Yes Singh, Prashant K, MD  latanoprost  (XALATAN ) 0.005 % ophthalmic solution Place 1 drop into both eyes at bedtime.  03/13/19  Yes [provider]  Lifitegrast  (XIIDRA ) 5 % SOLN Place 1 drop into both eyes in the morning and at bedtime.   Yes [provider]  loperamide  (IMODIUM ) 2 MG capsule Take 2 mg by mouth as needed for diarrhea or loose stools.   Yes [provider]  methimazole  (TAPAZOLE ) 5 MG tablet Take 5 mg by mouth daily. 04/07/23  Yes [provider]  mupirocin  ointment (BACTROBAN ) 2 % Place 1 Application into the nose 2 (two) times daily. Patient taking differently: Apply 1 Application topically 2 (two) times daily. 06/20/23  Yes Will Almarie MATSU, MD  Nutritional Supplements (FEEDING SUPPLEMENT, OSMOLITE 1.5 CAL,) LIQD Place 237 mLs into feeding tube continuous. 04/03/20  Yes [provider]  Nutritional Supplements (PROSOURCE) LIQD Give 45 mLs by tube in the morning and at bedtime.   Yes [provider]  prednisoLONE  acetate (PRED FORTE ) 1 % ophthalmic suspension Place 1 drop into the left eye daily. 05/13/23  Yes [provider]  SIMBRINZA 1-0.2 % SUSP Place 1 drop into both eyes in the morning and at bedtime.  05/16/19  Yes [provider]  Varenicline  Tartrate 0.03 MG/ACT  SOLN Place 1 spray into both nostrils 2 (two) times daily. 06/16/23  Yes [provider]  vitamin B-12 (CYANOCOBALAMIN ) 500 MCG tablet Take 1 tablet by mouth 2 (two) times daily. 01/03/21  Yes [provider]  Water  For Irrigation, Sterile (FREE WATER ) SOLN Place 120 mLs into feeding tube 5 (five) times daily. 04/03/20  Yes Cheryle Page, MD  White Petrolatum -Mineral Oil (TEARS AGAIN) OINT Place 1 application  into both eyes in the morning  and at bedtime. Refresh PM   Yes [provider]    Allergies: Other, Influenza vaccines, and Robitussin cold cough+ chest [dextromethorphan -guaifenesin ]    Review of Systems  Respiratory:  Positive for cough and shortness of breath.   Neurological:  Positive for weakness.  All other systems reviewed and are negative.   Updated Vital Signs BP (!) 104/53   Pulse (!) 108   Temp 98.1 F (36.7 C) (Oral)   Resp (!) 27   SpO2 92%   Physical Exam Vitals and nursing note reviewed.  Constitutional:      General: He is not in acute distress.    Appearance: Normal appearance. He is not ill-appearing or diaphoretic.  HENT:     Head: Normocephalic and atraumatic.     Mouth/Throat:     Mouth: Mucous membranes are moist.     Pharynx: Oropharynx is clear. No oropharyngeal exudate or posterior oropharyngeal erythema.  Eyes:     General: No scleral icterus.       Right eye: No discharge.        Left eye: No discharge.     Extraocular Movements: Extraocular movements intact.     Conjunctiva/sclera: Conjunctivae normal.     Pupils: Pupils are equal, round, and reactive to light.  Cardiovascular:     Rate and Rhythm: Regular rhythm. Tachycardia present.     Pulses: Normal pulses.     Heart sounds: Normal heart sounds. No murmur heard.    No friction rub. No gallop.  Pulmonary:     Effort: Pulmonary effort is normal. No respiratory distress.     Breath sounds: No stridor. Rales present. No wheezing or rhonchi.  Chest:     Chest wall: No tenderness.  Abdominal:     General: Abdomen is flat. There is no distension.     Palpations: Abdomen is soft.     Tenderness: There is no abdominal tenderness. There is no right CVA tenderness, left CVA tenderness, guarding or rebound.  Musculoskeletal:        General: No swelling, deformity or signs of injury.     Cervical back: Normal range of motion. No rigidity.     Right lower leg: No edema.     Left lower leg: No edema.  Skin:     General: Skin is warm and dry.     Findings: No bruising, erythema or lesion.  Neurological:     General: No focal deficit present.     Mental Status: He is alert and oriented to person, place, and time. Mental status is at baseline.     Cranial Nerves: No cranial nerve deficit.     Sensory: No sensory deficit.     Motor: No weakness.     Coordination: Coordination normal.     Comments: No facial asymmetry, no ataxia, no apraxia, no aphasia, no arm drift, normal coordination with finger-to-nose, normal sensation to both upper and lower extremities bilaterally, normal grip strength bilaterally, normal strength to both flexion and extension to both upper lower  extremities 5+ bilaterally, no visual field deficits, no nystagmus.   Psychiatric:        Mood and Affect: Mood normal.     (all labs ordered are listed, but only abnormal results are displayed) Labs Reviewed  CBC WITH DIFFERENTIAL/PLATELET - Abnormal; Notable for the following components:      Result Value   WBC 14.7 (*)    RBC 3.22 (*)    Hemoglobin 9.6 (*)    HCT 28.6 (*)    Neutro Abs 13.7 (*)    Lymphs Abs 0.3 (*)    Abs Immature Granulocytes 0.15 (*)    All other components within normal limits  COMPREHENSIVE METABOLIC PANEL WITH GFR - Abnormal; Notable for the following components:   Sodium 117 (*)    Potassium 5.4 (*)    Chloride 89 (*)    CO2 13 (*)    Glucose, Bld 213 (*)    BUN 72 (*)    Creatinine, Ser 2.81 (*)    GFR, Estimated 21 (*)    All other components within normal limits  PROTIME-INR - Abnormal; Notable for the following components:   Prothrombin Time 16.3 (*)    All other components within normal limits  I-STAT CG4 LACTIC ACID, ED - Abnormal; Notable for the following components:   Lactic Acid, Venous 2.0 (*)    All other components within normal limits  TROPONIN T, HIGH SENSITIVITY - Abnormal; Notable for the following components:   Troponin T High Sensitivity 208 (*)    All other components  within normal limits  RESP PANEL BY RT-PCR (RSV, FLU A&B, COVID)  RVPGX2  CULTURE, BLOOD (ROUTINE X 2)  CULTURE, BLOOD (ROUTINE X 2)  URINALYSIS, W/ REFLEX TO CULTURE (INFECTION SUSPECTED)  I-STAT CG4 LACTIC ACID, ED    EKG: EKG Interpretation Date/Time:  Thursday March 18 2024 13:16:22 EST Ventricular Rate:  105 PR Interval:  184 QRS Duration:  167 QT Interval:  365 QTC Calculation: 483 R Axis:   -82  Text Interpretation: Sinus tachycardia RBBB and LAFB Confirmed by Yolande Charleston [phone]) on 03/18/2024 3:04:14 PM  Radiology: DG Chest 2 View Result Date: 03/18/2024 EXAM: 2 VIEW(S) XRAY OF THE CHEST 03/18/2024 01:55:00 PM COMPARISON: 02/11/2024 CLINICAL HISTORY: cough and shortness of breath FINDINGS: LUNGS AND PLEURA: Patchy opacities in right mid lung and left upper lung. Trace right pleural effusion. No pneumothorax. HEART AND MEDIASTINUM: Aortic atherosclerotic calcification. No acute abnormality of the cardiac silhouette. BONES AND SOFT TISSUES: No acute osseous abnormality. IMPRESSION: 1. Patchy opacities in right mid lung and left upper lung. Compatible with multifocal pneumonia. 2. Trace right pleural effusion. Electronically signed by: Waddell Calk MD 03/18/2024 02:16 PM EST RP Workstation: [id number]    Procedures   Medications Ordered in the ED  azithromycin  (ZITHROMAX ) 500 mg in sodium chloride  0.9 % 250 mL IVPB (500 mg Intravenous New Bag/Given 03/18/24 1531)  lactated ringers  bolus 500 mL (0 mLs Intravenous Stopped 03/18/24 1411)  cefTRIAXone  (ROCEPHIN ) 2 g in sodium chloride  0.9 % 100 mL IVPB (0 g Intravenous Stopped 03/18/24 1505)  sodium chloride  0.9 % bolus 1,000 mL (1,000 mLs Intravenous New Bag/Given 03/18/24 1529)  benzonatate  (TESSALON ) capsule 100 mg (100 mg Oral Given 03/18/24 1533)  morphine  (PF) 2 MG/ML injection 2 mg (2 mg Intravenous Given 03/18/24 1533)    Clinical Course as of 03/18/24 1542  Thu Mar 18, 2024  1453 Spoke with intensivist,  Tinnie Furth, who said that due to patient's normal neuroexam and not being on  hypertonic saline, the patient was to be sought to admitted to hospitalist and if hospitalist is not willing to admit, to have hospitalist call intensivist. [CB]  1527 Spoke to cards master Lolita who said that they would come evaluate the patient. [CB]  [id number] Spoke with Dr. Seena with hospitalist, who accepted patient care at this time. [CB]    Clinical Course User Index [CB] Beola Terrall RAMAN, PA-C    Medical Decision Making  This patient is a 89 year old male who presents to the ED for concern of worsening cough, malaise, generalized weakness x 3 days.  Noted to have decreased oral intake secondary to coughing.  Noted to have had decreased p.o. intake over the last several days according to stepson who is at bedside.  On physical exam, patient is in no acute distress, afebrile, alert and orient x 4 notably is mildly tachycardic .  As well as tachypneic.  Satting well on room air.  Notable does have rales bilaterally with white sputum present in beard.  No lower leg edema.  With patient's compensation, suspecting possible sepsis versus dehydration however with normal vital signs outside of mild tachycardia and tachypnea, and put in for fluids.  Obtained lactic acid And chest x-ray which was elevated, sepsis protocol was then initiated.  Treating for multifocal pneumonia.  Additionally noted to be hyponatremic as well as having elevated troponin.  Spoke to intensivist Leita Haring who do not believe patient warranted ICU admission and wished to be admitted to hospitalist.    On reevaluation, patient notably became tachypneic and had saturations drop when trying to rest and secondary to coughing.  Added Tessalon  and morphine  to help with cough, put him on 4 L, patient saturations remained in high 80s, switched to NRB.  After switching, patient saturations rose to 98%.  Spoke with Dr. Seena with hospitalist, who  accepted patient care.  Additionally spoke with cards master Lolita who said that cardiology will come and see the patient.  Differential diagnoses prior to evaluation: The emergent differential diagnosis includes, but is not limited to, ACS, PE, ACS, pneumonia, bronchitis, stroke, dehydration, metabolic disturbance. This is not an exhaustive differential.   Past Medical History / Co-morbidities / Social History: HTN, HLD, CKD stage IV, type 2 diabetes migraines, CAD, anemia requiring blood transfusion, status post G-tube  Additional history: Chart reviewed. Pertinent results include:   Seen Emergency Department 02/11/2024.  Lab Tests/Imaging studies: I personally interpreted labs/imaging and the pertinent results include: CBC notes a leukocytosis of 14.7 and a hemoglobin 9.6, hemoglobin near baseline CMP notes a hyponatremia of 117 and hyperkalemia of 5.4 with an elevated creatinine and decreased GFR which have been worsening over the last month Lactic acid 2.0 PT elevated 16.3 with INR normal Troponin elevated at 208 Chest x-ray shows multifocal pneumonia Respiratory panel pending UA pending Blood cultures pending   I agree with the radiologist interpretation.  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  I have reviewed the patients home medicines and have made adjustments as needed.  Critical Interventions:  None  Social Determinants of Health: Lives at home with stepson and  wife  Disposition: After consideration of the diagnostic results and the patients response to treatment, I feel that the patient would benefit from admission, patient care transferred to Dr. Seena, evaluation from cardiology pending   Final diagnoses:  Sepsis due to pneumonia [facility])  Multifocal pneumonia  Hyponatremia  Elevated troponin    ED Discharge Orders     None          Beola Terrall RAMAN, [place] 03/18/24 1549

## 2024-03-18 NOTE — H&P (Addendum)
 History and Physical   John Walton FMW:[id number] DOB: [birth year] DOA: 03/18/2024  PCP: Waymond Ryan Askew, MD   Patient coming from: Home  Chief Complaint: Weakness, cough  HPI: John Walton is a 89 y.o. male with medical history significant of hypertension, hyperlipidemia, GERD, diabetes, CKD 4, carotid artery disease, CVA, aortic stenosis, hypothyroidism, glaucoma, BPH status post TURP, anemia, status post G-tube presenting with weakness and cough.  Patient has had worsening weakness recently and today he had trouble getting out of bed which called EMS.  Also reports recent decrease intake and chronic cough which has become productive/worsened with white sputum.  Denies fevers, chills, chest pain, shortness of breath, abdominal pain, constipation, diarrhea, nausea, vomiting.  ED Course: Vital signs in ED notable for heart rate in the 100s, blood pressure in the 100s-120s systolic, respirate rate in the 20s, requiring 4 to 10 L of supplemental oxygen to maintain saturations in the ED.  Lab workup notable for CMP with sodium of 117 which corrects to 119 in the setting of glucose of 213, potassium 5.4, chloride 89, bicarb 13, BUN 72, creatinine elevated 2.8 from baseline 2.3.  CBC with leukocytosis of 14.7, hemoglobin stable at 9.6.  PT 16.3, INR normal.  Troponin 200, repeat pending.  Lactic acid 2, repeat pending.  Respiratory panel for flu COVID and RSV pending.  Blood cultures pending.  Urinalysis pending.  Chest x-ray with patchy opacities consistent with bilateral pneumonia.  Patient received ceftriaxone , azithromycin , 300 cc LR followed by normal saline bolus.  Case discussed with cardiology who will evaluate and case discussed with critical care who recommended patient be admitted to the floor given stable mental status.  Review of Systems: As per HPI otherwise all other systems reviewed and are negative.  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 (HCC) 04/01/2020   Sep 12, 2023 Entered By: DARWIN ANES D Comment: Possibly right middle cerebral artery with left hemiparesis and dysphagia   Sep 12, 2023 Entered By: DARWIN ANES D Comment: PEG tube is placed but not used     Chicken pox    Chronic kidney disease    Coronary artery disease    Diabetes mellitus without complication (HCC)    Frequent headaches    Glaucoma    Hay fever    History of blood transfusion    HTN (hypertension)    Hyperlipidemia    Hyperlipidemia    Hypothyroidism    Migraines    Peripheral vascular disease    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(HCC)    Stroke-like symptoms     Past Surgical History:  Procedure Laterality Date   CHOLECYSTECTOMY, LAPAROSCOPIC     ENDARTERECTOMY Right 12/22/2020   Procedure: RIGHT CAROTID ENDARTERECTOMY;  Surgeon: Eliza Lonni RAMAN, MD;  Location: [facility] OR;  Service: Vascular;  Laterality: Right;   IR GASTROSTOMY TUBE MOD SED  03/29/2020   TONSILLECTOMY  04/01/1940   TOTAL HIP ARTHROPLASTY Left 03/08/2020   Procedure: TOTAL HIP ARTHROPLASTY;  Surgeon: Barbarann Anes BROCKS, MD;  Location: [facility] ORS;  Service: Orthopedics;  Laterality: Left;   TRANSURETHRAL RESECTION OF PROSTATE N/A 12/14/2020   Procedure: TRANSURETHRAL RESECTION OF THE PROSTATE (TURP);  Surgeon: Matilda Senior, MD;  Location: [facility] ORS;  Service: Urology;  Laterality: N/A;  2 HRS    Social History  reports that he quit smoking about 47 years ago. His smoking use included cigarettes. He  started smoking about 56 years ago. He has never used smokeless tobacco. He reports that he does not currently use alcohol . He reports that he does not use drugs.  Allergies[1]  Family History  Problem Relation Age of Onset   Liver disease Mother    Skin cancer Father    Skin cancer Paternal Grandfather    Diabetes Maternal Grandfather   Reviewed on admission  Prior to Admission medications   Medication Sig Start Date End Date Taking? Authorizing Provider  acetaminophen  (TYLENOL ) 500 MG tablet Take 500 mg by mouth as needed for pain.   Yes [provider]  aspirin  EC 81 MG tablet Take 1 tablet (81 mg total) by mouth daily. 12/26/20  Yes Singh, Prashant K, MD  atorvastatin  (LIPITOR) 40 MG tablet Take 40 mg by mouth at bedtime.   Yes [provider]  Calcium  Carb-Cholecalciferol  (OYSTER SHELL CALCIUM  W/D) 500-5 MG-MCG TABS Take 1 tablet by mouth daily. 10/31/20  Yes [provider]  carboxymethylcellulose (REFRESH PLUS) 0.5 % SOLN Place 1 drop into both eyes every hour while awake.   Yes [provider]  Ensure (ENSURE) Take 237 mLs by mouth 3 (three) times daily between meals.   Yes [provider]  ferrous sulfate  325 (65 FE) MG tablet Take 1 tablet (325 mg total) by mouth daily with breakfast. 12/26/20  Yes Singh, Prashant K, MD  latanoprost  (XALATAN ) 0.005 % ophthalmic solution Place 1 drop into both eyes at bedtime.  03/13/19  Yes [provider]  Lifitegrast  (XIIDRA ) 5 % SOLN Place 1 drop into both eyes in the morning and at bedtime.   Yes [provider]  loperamide  (IMODIUM ) 2 MG capsule Take 2 mg by mouth as needed for diarrhea or loose stools.   Yes [provider]  methimazole  (TAPAZOLE ) 5 MG tablet Take 5 mg by mouth daily. 04/07/23  Yes [provider]  mupirocin  ointment (BACTROBAN ) 2 % Place 1 Application into the nose 2 (two) times daily. Patient taking differently: Apply 1 Application topically 2 (two) times daily. 06/20/23  Yes Will Almarie MATSU, MD  Nutritional Supplements (FEEDING SUPPLEMENT, OSMOLITE 1.5 CAL,) LIQD Place 237 mLs into feeding tube continuous. 04/03/20  Yes [provider]  Nutritional Supplements (PROSOURCE) LIQD Give 45 mLs by tube in the morning and at bedtime.   Yes [provider]  prednisoLONE  acetate (PRED FORTE ) 1 % ophthalmic suspension Place 1 drop  into the left eye daily. 05/13/23  Yes [provider]  SIMBRINZA 1-0.2 % SUSP Place 1 drop into both eyes in the morning and at bedtime.  05/16/19  Yes [provider]  Varenicline  Tartrate 0.03 MG/ACT SOLN Place 1 spray into both nostrils 2 (two) times daily. 06/16/23  Yes [provider]  vitamin B-12 (CYANOCOBALAMIN ) 500 MCG tablet Take 1 tablet by mouth 2 (two) times daily. 01/03/21  Yes [provider]  Water  For Irrigation, Sterile (FREE WATER ) SOLN Place 120 mLs into feeding tube 5 (five) times daily. 04/03/20  Yes Cheryle Page, MD  White Petrolatum -Mineral Oil (TEARS AGAIN) OINT Place 1 application  into both eyes in the morning and at bedtime. Refresh PM   Yes [provider]    Physical Exam: Vitals:   03/18/24 1152 03/18/24 1315 03/18/24 1415 03/18/24 1500  BP: (!) 105/54 112/64 121/67 (!) 104/53  Pulse: (!) 108 (!) 105 (!) 106 (!) 108  Resp: 19 (!) 29 (!) 30 (!) 27  Temp: 98.3 F (36.8 C) 98.1 F (36.7  C)    TempSrc: Oral Oral    SpO2: 100% 97% 90% 92%    Physical Exam Constitutional:      General: He is not in acute distress.    Appearance: Normal appearance.  HENT:     Head: Normocephalic and atraumatic.     Mouth/Throat:     Mouth: Mucous membranes are moist.     Pharynx: Oropharynx is clear.  Eyes:     Extraocular Movements: Extraocular movements intact.     Pupils: Pupils are equal, round, and reactive to light.  Cardiovascular:     Rate and Rhythm: Regular rhythm. Tachycardia present.     Pulses: Normal pulses.     Heart sounds: Normal heart sounds.  Pulmonary:     Effort: Pulmonary effort is normal. No respiratory distress.     Breath sounds: Rhonchi present.  Abdominal:     General: Bowel sounds are normal. There is no distension.     Palpations: Abdomen is soft.     Tenderness: There is no abdominal tenderness.  Musculoskeletal:        General: No swelling or deformity.  Skin:    General: Skin is warm and  dry.  Neurological:     General: No focal deficit present.     Mental Status: Mental status is at baseline.    Labs on Admission: I have personally reviewed following labs and imaging studies  CBC: Recent Labs  Lab 03/18/24 1320  WBC 14.7*  NEUTROABS 13.7*  HGB 9.6*  HCT 28.6*  MCV 88.8  PLT 363    Basic Metabolic Panel: Recent Labs  Lab 03/18/24 1320  NA 117*  K 5.4*  CL 89*  CO2 13*  GLUCOSE 213*  BUN 72*  CREATININE 2.81*  CALCIUM  9.0    GFR: CrCl cannot be calculated (Unknown ideal weight.).  Liver Function Tests: Recent Labs  Lab 03/18/24 1320  AST 29  ALT 11  ALKPHOS 104  BILITOT 0.5  PROT 7.4  ALBUMIN  3.7    Urine analysis:    Component Value Date/Time   COLORURINE YELLOW 02/11/2024 1611   APPEARANCEUR CLOUDY (A) 02/11/2024 1611   LABSPEC 1.020 02/11/2024 1611   PHURINE 5.5 02/11/2024 1611   GLUCOSEU NEGATIVE 02/11/2024 1611   GLUCOSEU 500 (A) 07/11/2014 0850   HGBUR MODERATE (A) 02/11/2024 1611   BILIRUBINUR NEGATIVE 02/11/2024 1611   KETONESUR NEGATIVE 02/11/2024 1611   PROTEINUR 100 (A) 02/11/2024 1611   UROBILINOGEN 0.2 07/11/2014 0850   NITRITE NEGATIVE 02/11/2024 1611   LEUKOCYTESUR LARGE (A) 02/11/2024 1611    Radiological Exams on Admission: DG Chest 2 View Result Date: 03/18/2024 EXAM: 2 VIEW(S) XRAY OF THE CHEST 03/18/2024 01:55:00 PM COMPARISON: 02/11/2024 CLINICAL HISTORY: cough and shortness of breath FINDINGS: LUNGS AND PLEURA: Patchy opacities in right mid lung and left upper lung. Trace right pleural effusion. No pneumothorax. HEART AND MEDIASTINUM: Aortic atherosclerotic calcification. No acute abnormality of the cardiac silhouette. BONES AND SOFT TISSUES: No acute osseous abnormality. IMPRESSION: 1. Patchy opacities in right mid lung and left upper lung. Compatible with multifocal pneumonia. 2. Trace right pleural effusion. Electronically signed by: Waddell Calk MD 03/18/2024 02:16 PM EST RP Workstation: [id number]    EKG: Independently reviewed.  Sinus tachycardia at 105 beats minute.  Nonspecific T wave changes.  Right bundle branch block with QRS 167.  Assessment/Plan Principal Problem:   Hyponatremia Active Problems:   Essential hypertension   Hyperlipidemia   Normocytic anemia   DM (diabetes mellitus), type 2 (HCC)  Weakness   BPH with obstruction/lower urinary tract symptoms   S/P TURP (status post transurethral resection of prostate)   Symptomatic stenosis of right carotid artery   Gastro-esophageal reflux disease without esophagitis   Nonrheumatic aortic (valve) stenosis   Acute renal failure superimposed on stage 4 chronic kidney disease (HCC)   History of CVA (cerebrovascular accident)   CAP (community acquired pneumonia)   Hyponatremia Hyperkalemia Elevated gap metabolic acidosis with uremia AKI on CKD 4 > Presented with weakness and cough.  Decreased intake PO recently. > Noted to have sodium 117 which corrects to 119 in the setting of hyperglycemia. > Bicarb 13, BUN 72, creatinine elevated to 2.81 from baseline of 2.3.  Potassium 5.4 > Received 300 cc LR followed by being started on 1 L normal saline bolus in the ED. > PCCM recommended patient be admitted to the floor given no change in mental status at this point - Monitor in progressive unit - Repeat BMP now and every 4 hours - Rate of fluids to be determined on degree of correction after initial fluids in ED - Serum osmolality, urine osmolality, urine sodium - N.p.o. - Supportive care  Pneumonia Acute respiratory failure with hypoxia > Evidence of multifocal pneumonia on chest x-ray.  History of aspiration pneumonia. > Requiring 4 to 10 L of supplemental oxygen to maintain saturations in the ED.   > Desaturates during intermittent coughing episodes, but weaned to 10 L on nonrebreather and able to stay in the mid to upper 90s even during coughing episodes. > Due to prior allergic reactions cough medication is not an  option.  Did receive pain medication while in the ED to attempt to help with his cough. > Worsening cough with white sputum recently.  Leukocytosis to 14.7. > Started on ceftriaxone  and azithromycin  in the ED - Continue with ceftriaxone  and azithromycin  - Continue supplemental oxygen, wean as tolerated - Sputum culture, urinary antigens - Trend fever and WBC - Procalcitonin  Status post G-tube History of dysphagia Allergy to cough medications > Patient is status post G-tube years ago per family; when he had an allergic reaction to cough medicine causing narrowing of his airway/esophagus.  Was on this for some time and then had improvement was able to swallow better. > Has had intermittent issues with dysphagia and has had aspiration pneumonia in the past and so has maintained G-tube in place to use for supplemental nutrition or as needed. > Swallow study was performed in October of this year and recommendation was for p.o. diet, regular.  Thin liquids.  Medication to be administered by alternative means. - Currently n.p.o. and will avoid additional nutrition/hydration through this route as well while sodium corrects.  Troponin elevation > Likely demand ischemia in the setting of above - Appreciate cardiology recommendations - Trend troponin  Vascular dementia > Family reports patient had a gradual decline in his mental status especially over the past 6 months to a year.  Possibly mildly worse while he has been ill in the last few days but does have this baseline dementia.  Is alert and arousable for me though has confusion. - Continue to monitor  Aortic stenosis Mitral stenosis HFmrEF > Echo in October showed moderate mitral stenosis and severe aortic stenosis with EF 40-45%. - Noted  Hypertension - Not a medication for this  Hyperlipidemia - Continue home atorvastatin   Diabetes - SSI  Carotid artery disease History of CVA - Continue home ASA, atorvastatin   Glaucoma -  Continue eyedrops  Anemia >  Hemoglobin stable at 9.6. - Trend CBC  DVT prophylaxis: Heparin  Code Status:   DNR/DNI Family Communication:  Updated at bedside Disposition Plan:   Patient is from:  Home  Anticipated DC to:  Home  Anticipated DC date:  3 to 5 days  Anticipated DC barriers: None  Consults called:  Cardiology consulted in the ED, case discussed with PCCM but patient evaluated in the ED Admission status:  Inpatient, progressive  Severity of Illness: The appropriate patient status for this patient is INPATIENT. Inpatient status is judged to be reasonable and necessary in order to provide the required intensity of service to ensure the patient's safety. The patient's presenting symptoms, physical exam findings, and initial radiographic and laboratory data in the context of their chronic comorbidities is felt to place them at high risk for further clinical deterioration. Furthermore, it is not anticipated that the patient will be medically stable for discharge from the hospital within 2 midnights of admission.   * I certify that at the point of admission it is my clinical judgment that the patient will require inpatient hospital care spanning beyond 2 midnights from the point of admission due to high intensity of service, high risk for further deterioration and high frequency of surveillance required.DEWAINE Marsa KATHEE Seena MD [facility]  How to contact the [facility] Attending or Consulting provider 7A - 7P or covering provider during after hours 7P -7A, for this patient?   Check the care team in [facility] and look for a) attending/consulting [facility] provider listed and b) the [facility] team listed Log into [email or web address] and use [place]'s universal password to access. If you do not have the password, please contact the hospital operator. Locate the [facility] provider you are looking for under [facility] and page to a number that you can be directly reached. If you still have difficulty  reaching the provider, please page the [facility] (Director on Call) for the Hospitalists listed on amion for assistance.  03/18/2024, 3:43 PM       [1]  Allergies Allergen Reactions   Other Swelling and Other (See Comments)    ALL COUGH MEDICATIONS   Influenza Vaccines Other (See Comments)    Unknown   Robitussin Cold Cough+ Chest [Dextromethorphan -Guaifenesin ] Swelling and Other (See Comments)    Laryngeal edema - any cough syrup; Wife states Robitussin and ANY cough syrup closes patient's throat

## 2024-03-18 NOTE — ED Notes (Signed)
 Ozell (stepson) [phone]

## 2024-03-19 ENCOUNTER — Inpatient Hospital Stay (HOSPITAL_COMMUNITY)

## 2024-03-19 ENCOUNTER — Other Ambulatory Visit: Payer: Self-pay

## 2024-03-19 ENCOUNTER — Encounter (HOSPITAL_COMMUNITY): Payer: Self-pay | Admitting: Internal Medicine

## 2024-03-19 LAB — BASIC METABOLIC PANEL WITH GFR
Anion gap: 13 (ref 5–15)
Anion gap: 14 (ref 5–15)
Anion gap: 13 (ref 5–15)
Anion gap: 13 (ref 5–15)
Anion gap: 15 (ref 5–15)
Anion gap: 18 — ABNORMAL HIGH (ref 5–15)
BUN: 72 mg/dL — ABNORMAL HIGH (ref 8–23)
BUN: 73 mg/dL — ABNORMAL HIGH (ref 8–23)
BUN: 74 mg/dL — ABNORMAL HIGH (ref 8–23)
BUN: 72 mg/dL — ABNORMAL HIGH (ref 8–23)
BUN: 74 mg/dL — ABNORMAL HIGH (ref 8–23)
BUN: 75 mg/dL — ABNORMAL HIGH (ref 8–23)
CO2: 13 mmol/L — ABNORMAL LOW (ref 22–32)
CO2: 11 mmol/L — ABNORMAL LOW (ref 22–32)
CO2: 14 mmol/L — ABNORMAL LOW (ref 22–32)
CO2: 14 mmol/L — ABNORMAL LOW (ref 22–32)
CO2: 11 mmol/L — ABNORMAL LOW (ref 22–32)
CO2: 10 mmol/L — ABNORMAL LOW (ref 22–32)
Calcium: 8 mg/dL — ABNORMAL LOW (ref 8.9–10.3)
Calcium: 8 mg/dL — ABNORMAL LOW (ref 8.9–10.3)
Calcium: 8.2 mg/dL — ABNORMAL LOW (ref 8.9–10.3)
Calcium: 8 mg/dL — ABNORMAL LOW (ref 8.9–10.3)
Calcium: 8.1 mg/dL — ABNORMAL LOW (ref 8.9–10.3)
Calcium: 8.1 mg/dL — ABNORMAL LOW (ref 8.9–10.3)
Chloride: 97 mmol/L — ABNORMAL LOW (ref 98–111)
Chloride: 96 mmol/L — ABNORMAL LOW (ref 98–111)
Chloride: 99 mmol/L (ref 98–111)
Chloride: 98 mmol/L (ref 98–111)
Chloride: 100 mmol/L (ref 98–111)
Chloride: 99 mmol/L (ref 98–111)
Creatinine, Ser: 2.6 mg/dL — ABNORMAL HIGH (ref 0.61–1.24)
Creatinine, Ser: 2.59 mg/dL — ABNORMAL HIGH (ref 0.61–1.24)
Creatinine, Ser: 2.57 mg/dL — ABNORMAL HIGH (ref 0.61–1.24)
Creatinine, Ser: 2.53 mg/dL — ABNORMAL HIGH (ref 0.61–1.24)
Creatinine, Ser: 2.47 mg/dL — ABNORMAL HIGH (ref 0.61–1.24)
Creatinine, Ser: 2.59 mg/dL — ABNORMAL HIGH (ref 0.61–1.24)
GFR, Estimated: 23 mL/min — ABNORMAL LOW
GFR, Estimated: 23 mL/min — ABNORMAL LOW
GFR, Estimated: 23 mL/min — ABNORMAL LOW
GFR, Estimated: 24 mL/min — ABNORMAL LOW
GFR, Estimated: 24 mL/min — ABNORMAL LOW
GFR, Estimated: 23 mL/min — ABNORMAL LOW
Glucose, Bld: 164 mg/dL — ABNORMAL HIGH (ref 70–99)
Glucose, Bld: 153 mg/dL — ABNORMAL HIGH (ref 70–99)
Glucose, Bld: 154 mg/dL — ABNORMAL HIGH (ref 70–99)
Glucose, Bld: 127 mg/dL — ABNORMAL HIGH (ref 70–99)
Glucose, Bld: 112 mg/dL — ABNORMAL HIGH (ref 70–99)
Glucose, Bld: 151 mg/dL — ABNORMAL HIGH (ref 70–99)
Potassium: 5 mmol/L (ref 3.5–5.1)
Potassium: 4.9 mmol/L (ref 3.5–5.1)
Potassium: 4.9 mmol/L (ref 3.5–5.1)
Potassium: 4.7 mmol/L (ref 3.5–5.1)
Potassium: 4.5 mmol/L (ref 3.5–5.1)
Potassium: 4.7 mmol/L (ref 3.5–5.1)
Sodium: 123 mmol/L — ABNORMAL LOW (ref 135–145)
Sodium: 121 mmol/L — ABNORMAL LOW (ref 135–145)
Sodium: 125 mmol/L — ABNORMAL LOW (ref 135–145)
Sodium: 125 mmol/L — ABNORMAL LOW (ref 135–145)
Sodium: 126 mmol/L — ABNORMAL LOW (ref 135–145)
Sodium: 127 mmol/L — ABNORMAL LOW (ref 135–145)

## 2024-03-19 LAB — CBC
HCT: 24.3 % — ABNORMAL LOW (ref 39.0–52.0)
HCT: 24.3 % — ABNORMAL LOW (ref 39.0–52.0)
Hemoglobin: 7.7 g/dL — ABNORMAL LOW (ref 13.0–17.0)
Hemoglobin: 8 g/dL — ABNORMAL LOW (ref 13.0–17.0)
MCH: 29.3 pg (ref 26.0–34.0)
MCH: 30.1 pg (ref 26.0–34.0)
MCHC: 31.7 g/dL (ref 30.0–36.0)
MCHC: 32.9 g/dL (ref 30.0–36.0)
MCV: 92.4 fL (ref 80.0–100.0)
MCV: 91.4 fL (ref 80.0–100.0)
Platelets: 292 K/uL (ref 150–400)
Platelets: 330 K/uL (ref 150–400)
RBC: 2.63 MIL/uL — ABNORMAL LOW (ref 4.22–5.81)
RBC: 2.66 MIL/uL — ABNORMAL LOW (ref 4.22–5.81)
RDW: 13 % (ref 11.5–15.5)
RDW: 13.2 % (ref 11.5–15.5)
WBC: 7.6 K/uL (ref 4.0–10.5)
WBC: 6.6 K/uL (ref 4.0–10.5)
nRBC: 0 % (ref 0.0–0.2)
nRBC: 0 % (ref 0.0–0.2)

## 2024-03-19 LAB — BLOOD GAS, VENOUS
Acid-base deficit: 13.4 mmol/L — ABNORMAL HIGH (ref 0.0–2.0)
Bicarbonate: 12.3 mmol/L — ABNORMAL LOW (ref 20.0–28.0)
O2 Saturation: 62.6 %
Patient temperature: 36.4
pCO2, Ven: 27 mmHg — ABNORMAL LOW (ref 44–60)
pH, Ven: 7.26 (ref 7.25–7.43)
pO2, Ven: 37 mmHg (ref 32–45)

## 2024-03-19 LAB — GLUCOSE, CAPILLARY
Glucose-Capillary: 119 mg/dL — ABNORMAL HIGH (ref 70–99)
Glucose-Capillary: 106 mg/dL — ABNORMAL HIGH (ref 70–99)
Glucose-Capillary: 156 mg/dL — ABNORMAL HIGH (ref 70–99)

## 2024-03-19 LAB — TYPE AND SCREEN
ABO/RH(D): A POS
Antibody Screen: NEGATIVE

## 2024-03-19 LAB — CBG MONITORING, ED
Glucose-Capillary: 137 mg/dL — ABNORMAL HIGH (ref 70–99)
Glucose-Capillary: 124 mg/dL — ABNORMAL HIGH (ref 70–99)

## 2024-03-19 LAB — URINALYSIS, W/ REFLEX TO CULTURE (INFECTION SUSPECTED)
Bilirubin Urine: NEGATIVE
Glucose, UA: NEGATIVE mg/dL
Hgb urine dipstick: NEGATIVE
Ketones, ur: NEGATIVE mg/dL
Leukocytes,Ua: NEGATIVE
Nitrite: NEGATIVE
Protein, ur: 30 mg/dL — AB
Specific Gravity, Urine: 1.02 (ref 1.005–1.030)
pH: 5.5 (ref 5.0–8.0)

## 2024-03-19 LAB — HEMOGLOBIN AND HEMATOCRIT, BLOOD
HCT: 23.6 % — ABNORMAL LOW (ref 39.0–52.0)
Hemoglobin: 7.7 g/dL — ABNORMAL LOW (ref 13.0–17.0)

## 2024-03-19 LAB — IRON AND TIBC
Iron: 26 ug/dL — ABNORMAL LOW (ref 45–182)
Saturation Ratios: 14 % — ABNORMAL LOW (ref 17.9–39.5)
TIBC: 182 ug/dL — ABNORMAL LOW (ref 250–450)
UIBC: 156 ug/dL

## 2024-03-19 LAB — SODIUM, URINE, RANDOM: Sodium, Ur: <30 mmol/L

## 2024-03-19 LAB — OSMOLALITY: Osmolality: 290 mosm/kg (ref 275–295)

## 2024-03-19 LAB — LACTIC ACID, PLASMA
Lactic Acid, Venous: 1.3 mmol/L (ref 0.5–1.9)
Lactic Acid, Venous: 1.5 mmol/L (ref 0.5–1.9)

## 2024-03-19 LAB — PROTIME-INR
INR: 1.2 (ref 0.8–1.2)
Prothrombin Time: 16.1 s — ABNORMAL HIGH (ref 11.4–15.2)

## 2024-03-19 LAB — APTT: aPTT: 24 s (ref 24–36)

## 2024-03-19 LAB — OSMOLALITY, URINE: Osmolality, Ur: 350 mosm/kg (ref 300–900)

## 2024-03-19 LAB — PRO BRAIN NATRIURETIC PEPTIDE: Pro Brain Natriuretic Peptide: >35000 pg/mL — ABNORMAL HIGH

## 2024-03-19 LAB — PROCALCITONIN: Procalcitonin: 1.64 ng/mL

## 2024-03-19 LAB — MRSA NEXT GEN BY PCR, NASAL: MRSA by PCR Next Gen: NOT DETECTED

## 2024-03-19 LAB — FERRITIN: Ferritin: 792 ng/mL — ABNORMAL HIGH (ref 24–336)

## 2024-03-19 LAB — MAGNESIUM: Magnesium: 2.1 mg/dL (ref 1.7–2.4)

## 2024-03-19 LAB — OCCULT BLOOD X 1 CARD TO LAB, STOOL: Fecal Occult Bld: NEGATIVE

## 2024-03-19 MED ORDER — SODIUM BICARBONATE 8.4 % IV SOLN
INTRAVENOUS | Status: DC
  Filled 2024-03-19: qty 150

## 2024-03-19 MED ORDER — SODIUM CHLORIDE 0.9 % IV SOLN: INTRAVENOUS | Status: DC

## 2024-03-19 MED ORDER — METOPROLOL TARTRATE 5 MG/5ML IV SOLN: 2.5000 mg | INTRAVENOUS | Status: DC | PRN

## 2024-03-19 MED ORDER — SODIUM CHLORIDE 0.9 % IV BOLUS
500.0000 mL | Freq: Once | INTRAVENOUS | Status: AC
  Administered 2024-03-19: 500 mL via INTRAVENOUS

## 2024-03-19 NOTE — ED Notes (Signed)
 Patient had 800 cc from the in and out cath

## 2024-03-19 NOTE — Progress Notes (Signed)
 [facility] night cross cover note:   I was notified by the patient's RN  *** Patient called me and says he can't breathe! At beginning of shift he was calm but now is extremely anxious with HR in 140's, expressing that he can't breathe and feels like he's going to die. Was satting well on 2L [place] but his 02 sat dropped to 84-85 so he is now on 5L [place]. I paged respiratory to come look at him. He als sounds very gurgly and cannot cough anything up. ***  He also went into AFIB for about 5-10 mins and then converted back to NSR ***  VBG was performed shortly after 7 PM this evening Every 4 hour BMP reflected most recent potassium level of 4.5.  I have ordered a stat chest x-ray, magnesium  level, proBNP.  He is currently on sodium bicarbonate  and D5, running at 75 cc/h.  Not on any additional IV fluid at this time.  Hospitalization includes acute hyponatremia, acute Evoxac respiratory failure, as well as pneumonia, for which he is on azithromycin  and Rocephin .   please hold his current ivf's for now. CXR shows worsening b/l patchy opacities, which they are reading as pna, but he is very much at risk of volume overload with this chronic systolic heart failure and severe aortic stenosis. I'm following for the results of BNP and procalcitonin to help try to distinguish. Check stat CBC . Most recent prior hgb was 7.7    Eva Pore, DO Hospitalist

## 2024-03-19 NOTE — Progress Notes (Signed)
 [facility] night cross cover note:  Hgb trending down compared to most recent prior and relative to basesline chronic anemia.   I ordered iron studies, PTT, INR, type and screen, fecal occult blood test, repeat H&H to be checked around 9 AM this morning.  D/c'ed heparin  5000 units SQ 3 times daily for VTE prophylaxis, and ordered SCDs for VTE prophylaxis for now pending the above workup.    Eva Pore, DO Hospitalist

## 2024-03-19 NOTE — Progress Notes (Addendum)
 SLP Cancellation Note  Patient Details Name: John Walton MRN: [id number] DOB: 12-02-1932   Cancelled treatment:       Reason Eval/Treat Not Completed: Patient's level of consciousness;Patient not medically ready. Pt is currently poorly arousable. BP currently 89/54. RN notified. Pt NPO for procedure as well. Will continue efforts to assess swallow function/safety. Of note, pt has PEG for hydration. Regular solids. MBS 01/12/24: penetration of liquids, solid residue. No large volume aspiration c/w prior findings.   Tamu Golz B. Dory, MSP, CCC-SLP Speech Language Pathologist Office: [phone]  Dory Caprice Daring 03/19/2024, 12:19 PM

## 2024-03-19 NOTE — Progress Notes (Signed)
 Patients BP is 85/53.Secure chat sent to MD.

## 2024-03-19 NOTE — Progress Notes (Signed)
" PROGRESS NOTE    John Walton  FMW:[id number] DOB: [birth year] DOA: 03/18/2024 PCP: Waymond Ryan Askew, MD  Chief Complaint  Patient presents with   Weakness    Brief Narrative:   John Walton is John Walton 89 y.o. male with medical history significant of hypertension, hyperlipidemia, GERD, diabetes, CKD 4, carotid artery disease, CVA, aortic stenosis, hypothyroidism, glaucoma, BPH status post TURP, anemia, status post G-tube presenting with weakness and cough.   Admitted for multifocal pneumonia and metabolic abnormalities.    Assessment & Plan:   Principal Problem:   Hyponatremia Active Problems:   Essential hypertension   Hyperlipidemia   Normocytic anemia   DM (diabetes mellitus), type 2 (HCC)   Weakness   BPH with obstruction/lower urinary tract symptoms   S/P TURP (status post transurethral resection of prostate)   Symptomatic stenosis of right carotid artery   Gastro-esophageal reflux disease without esophagitis   Nonrheumatic aortic (valve) stenosis   Acute renal failure superimposed on stage 4 chronic kidney disease (HCC)   History of CVA (cerebrovascular accident)   CAP (community acquired pneumonia)   Vascular dementia (HCC)   Acute respiratory failure with hypoxia (HCC)  Hyponatremia Hyperkalemia Elevated gap metabolic acidosis AKI on CKD 4 > Noted to have sodium 117 at presentation > Bicarb 13, BUN 72, creatinine elevated to 2.81 from baseline of 2.3.  Potassium 5.4 - UA pending, lactic borderline  - Na improved to 121 with IVF, supports hypovolemic hyponatremia  - continue frequent Na checks, avoid overcorrection - continue IVF for now - strict I/O, daily weights - follow creatinine (baseline appears to be 2.2-2.5) - UA, urine sodium, urine osm, serum osm pending - follow renal US   Pneumonia Acute respiratory failure with hypoxia CXR with multifocal pneumonia, trace R effusion Negative covid, flu, RSV Urine strep, urine legionella, sputum  culture Continue ceftriaxone , azithromycin  Wean O2 as tolerated  Anemia Downtrending in setting of receiving IVF Will trend  Status post G-tube History of dysphagia Allergy to cough medications > Patient is status post G-tube years ago per family; when he had an allergic reaction to cough medicine causing narrowing of his airway/esophagus.  Was on this for some time and then had improvement was able to swallow better. > Has had intermittent issues with dysphagia and has had aspiration pneumonia in the past and so has maintained G-tube in place to use for supplemental nutrition or as needed. > Swallow study was performed in October of this year and recommendation was for p.o. diet, regular.  Thin liquids.  Medication to be administered by alternative means. - Currently n.p.o. and will avoid additional nutrition/hydration through this route as well while sodium corrects.   Troponin elevation Appreciate cards assistance, thought due demand in the setting of pneumonia, metabolic derangement, lactic acidosis, AKI - no further cardiac   Vascular dementia Hard of Hearing (speak loudly into his L ear) > Family reports patient had John Walton gradual decline in his mental status especially over the past 6 months to John Walton year.   Delirium precautions   Severe Aortic stenosis Mitral stenosis HFmrEF  Low normal RVSF > Echo in October showed moderate mitral stenosis and severe aortic stenosis with EF 40-45%. Caution with IVF, currently getting IVF with suspected hypovolemic hyponatremia   Hyperthyroidism methimazole    Hyperlipidemia - Continue home atorvastatin    Diabetes - SSI   Carotid artery disease History of CVA - Continue home ASA, atorvastatin    Glaucoma - Continue eyedrops    DVT prophylaxis: SCD Code Status:  DNR Family Communication: none Disposition:   Status is: Inpatient Remains inpatient appropriate because: need for continued inpatient care   Consultants:   none  Procedures:  none  Antimicrobials:  Anti-infectives (From admission, onward)    Start     Dose/Rate Route Frequency Ordered Stop   03/19/24 1000  cefTRIAXone  (ROCEPHIN ) 2 g in sodium chloride  0.9 % 100 mL IVPB        2 g 200 mL/hr over 30 Minutes Intravenous Every 24 hours 03/18/24 1543 03/20/2024 0959   03/19/24 1000  azithromycin  (ZITHROMAX ) 500 mg in sodium chloride  0.9 % 250 mL IVPB        500 mg 250 mL/hr over 60 Minutes Intravenous Every 24 hours 03/18/24 1543 03/12/2024 0959   03/18/24 1430  cefTRIAXone  (ROCEPHIN ) 2 g in sodium chloride  0.9 % 100 mL IVPB        2 g 200 mL/hr over 30 Minutes Intravenous Once 03/18/24 1423 03/18/24 1505   03/18/24 1430  azithromycin  (ZITHROMAX ) 500 mg in sodium chloride  0.9 % 250 mL IVPB        500 mg 250 mL/hr over 60 Minutes Intravenous  Once 03/18/24 1423 03/18/24 1631       Subjective:  Hard of hearing Denies complaints at this time  Objective: Vitals:   03/19/24 0500 03/19/24 0530 03/19/24 0600 03/19/24 0630  BP: (!) 94/55 (!) 97/59 (!) 92/57 (!) 96/58  Pulse: 78 79 81 82  Resp: 16 15 20 16   Temp:  98.7 F (37.1 C)    TempSrc:  Oral    SpO2: 97% 99% 99% 99%    Intake/Output Summary (Last 24 hours) at 03/19/2024 0902 Last data filed at 03/18/2024 1906 Gross per 24 hour  Intake 1000 ml  Output --  Net 1000 ml   There were no vitals filed for this visit.  Examination:  General exam: chronically ill appearing Respiratory system: unlabored, no adventitious lung sounds Cardiovascular system: RRR Gastrointestinal system: Abdomen is nondistended, soft and nontender.  Central nervous system: hard of hearing - moving all extremities Extremities: no LEE - no skin tenting    Data Reviewed: I have personally reviewed following labs and imaging studies  CBC: Recent Labs  Lab 03/18/24 1320 03/19/24 0326  WBC 14.7* 7.6  NEUTROABS 13.7*  --   HGB 9.6* 7.7*  HCT 28.6* 24.3*  MCV 88.8 92.4  PLT 363 292    Basic  Metabolic Panel: Recent Labs  Lab 03/18/24 1320 03/18/24 1528 03/18/24 1928 03/18/24 2328 03/19/24 0326  NA 117* 120* 133* 123* 121*  K 5.4* 5.3* 3.1* 5.0 4.9  CL 89* 93* 115* 97* 96*  CO2 13* 12* 8* 13* 11*  GLUCOSE 213* 209* 130* 164* 153*  BUN 72* 71* 46* 72* 73*  CREATININE 2.81* 2.69* 1.56* 2.60* 2.59*  CALCIUM  9.0 8.2* 4.8* 8.0* 8.0*    GFR: CrCl cannot be calculated (Unknown ideal weight.).  Liver Function Tests: Recent Labs  Lab 03/18/24 1320  AST 29  ALT 11  ALKPHOS 104  BILITOT 0.5  PROT 7.4  ALBUMIN  3.7    CBG: Recent Labs  Lab 03/18/24 1834 03/18/24 1938 03/18/24 2313 03/19/24 0347 03/19/24 0813  GLUCAP 214* 184* 149* 137* 124*     Recent Results (from the past 240 hours)  Resp panel by RT-PCR (RSV, Flu Nyiah Pianka&B, Covid) Anterior Nasal Swab     Status: None   Collection Time: 03/18/24  1:20 PM   Specimen: Anterior Nasal Swab  Result Value Ref Range Status  SARS Coronavirus 2 by RT PCR NEGATIVE NEGATIVE Final   Influenza Ertha Nabor by PCR NEGATIVE NEGATIVE Final   Influenza B by PCR NEGATIVE NEGATIVE Final    Comment: (NOTE) The Xpert Xpress SARS-CoV-2/FLU/RSV plus assay is intended as an aid in the diagnosis of influenza from Nasopharyngeal swab specimens and should not be used as Dellis Voght sole basis for treatment. Nasal washings and aspirates are unacceptable for Xpert Xpress SARS-CoV-2/FLU/RSV testing.  Fact Sheet for Patients: bloggercourse.com  Fact Sheet for Healthcare Providers: seriousbroker.it  This test is not yet approved or cleared by the [place]  FDA and has been authorized for detection and/or diagnosis of SARS-CoV-2 by FDA under an Emergency Use Authorization (EUA). This EUA will remain in effect (meaning this test can be used) for the duration of the COVID-19 declaration under Section 564(b)(1) of the Act, 21 U.S.C. section 360bbb-3(b)(1), unless the authorization is terminated  or revoked.     Resp Syncytial Virus by PCR NEGATIVE NEGATIVE Final    Comment: (NOTE) Fact Sheet for Patients: bloggercourse.com  Fact Sheet for Healthcare Providers: seriousbroker.it  This test is not yet approved or cleared by the [place]  FDA and has been authorized for detection and/or diagnosis of SARS-CoV-2 by FDA under an Emergency Use Authorization (EUA). This EUA will remain in effect (meaning this test can be used) for the duration of the COVID-19 declaration under Section 564(b)(1) of the Act, 21 U.S.C. section 360bbb-3(b)(1), unless the authorization is terminated or revoked.  Performed at [facility], [address], [place], [place] [address]          Radiology Studies: DG Chest 2 View Result Date: 03/18/2024 EXAM: 2 VIEW(S) XRAY OF THE CHEST 03/18/2024 01:55:00 PM COMPARISON: 02/11/2024 CLINICAL HISTORY: cough and shortness of breath FINDINGS: LUNGS AND PLEURA: Patchy opacities in right mid lung and left upper lung. Trace right pleural effusion. No pneumothorax. HEART AND MEDIASTINUM: Aortic atherosclerotic calcification. No acute abnormality of the cardiac silhouette. BONES AND SOFT TISSUES: No acute osseous abnormality. IMPRESSION: 1. Patchy opacities in right mid lung and left upper lung. Compatible with multifocal pneumonia. 2. Trace right pleural effusion. Electronically signed by: Waddell Calk MD 03/18/2024 02:16 PM EST RP Workstation: [id number]        Scheduled Meds:  artificial tears   Both Eyes BID   aspirin  EC  81 mg Oral Daily   atorvastatin   40 mg Oral QHS   brinzolamide   1 drop Both Eyes BID   And   brimonidine   1 drop Both Eyes BID   insulin  aspart  0-9 Units Subcutaneous Q4H   latanoprost   1 drop Both Eyes QHS   Lifitegrast   1 drop Both Eyes BID   methimazole   5 mg Oral Daily   prednisoLONE  acetate  1 drop Left Eye Daily   sodium chloride  flush  3 mL Intravenous Q12H    Continuous Infusions:  sodium chloride  75 mL/hr at 03/19/24 0232   azithromycin      cefTRIAXone  (ROCEPHIN )  IV 2 g (03/19/24 0858)     LOS: 1 day    Time spent: over 30 min     Meliton Monte, MD [facility]   To contact the attending provider between 7A-7P or the covering provider during after hours 7P-7A, please log into the web site [email or web address] and access using universal [place] password for that web site. If you do not have the password, please call the hospital operator.  03/19/2024, 9:02 AM    "

## 2024-03-19 NOTE — Plan of Care (Signed)
  Problem: Fluid Volume: Goal: Ability to maintain a balanced intake and output will improve Outcome: Progressing   Problem: Clinical Measurements: Goal: Respiratory complications will improve Outcome: Progressing

## 2024-03-19 NOTE — Progress Notes (Signed)
 [facility] night cross cover note:  Na starting to trend back down. I started ns 75 cc/hr .    Eva Pore, DO Hospitalist

## 2024-03-19 NOTE — ED Notes (Signed)
 Most recent blood work showed patient sodium has dropped since last level was checked. No current orders for correction.  Attending paged by secretary.

## 2024-03-19 NOTE — ED Notes (Addendum)
 Patient's hemoglobin noted to have dropped a couple points since last blood level taken.  When cleaning patient earlier in shift his stool did appear dark, and BP has been soft.  Attending made aware

## 2024-03-19 NOTE — ED Notes (Signed)
 Cleaned patient after soiling brief. Applied condom catheter due to incontinence.  Attempted to take patient off O2, however saturation began to drop to 91% on RA. Placed back on 2L nasal cannula and satting within normal limits.

## 2024-03-20 ENCOUNTER — Inpatient Hospital Stay (HOSPITAL_COMMUNITY)

## 2024-03-20 DIAGNOSIS — E871 Hypo-osmolality and hyponatremia: Secondary | ICD-10-CM | POA: Diagnosis not present

## 2024-03-20 HISTORY — PX: IR GASTROSTOMY TUBE REMOVAL: IMG5492

## 2024-03-20 LAB — STREP PNEUMONIAE URINARY ANTIGEN: Strep Pneumo Urinary Antigen: NEGATIVE

## 2024-03-20 LAB — GLUCOSE, CAPILLARY
Glucose-Capillary: 107 mg/dL — ABNORMAL HIGH (ref 70–99)
Glucose-Capillary: 107 mg/dL — ABNORMAL HIGH (ref 70–99)
Glucose-Capillary: 109 mg/dL — ABNORMAL HIGH (ref 70–99)
Glucose-Capillary: 120 mg/dL — ABNORMAL HIGH (ref 70–99)
Glucose-Capillary: 134 mg/dL — ABNORMAL HIGH (ref 70–99)
Glucose-Capillary: 136 mg/dL — ABNORMAL HIGH (ref 70–99)

## 2024-03-20 LAB — C-REACTIVE PROTEIN: CRP: 21.7 mg/dL — ABNORMAL HIGH

## 2024-03-20 LAB — PHOSPHORUS: Phosphorus: 4.6 mg/dL (ref 2.5–4.6)

## 2024-03-20 LAB — PRO BRAIN NATRIURETIC PEPTIDE: Pro Brain Natriuretic Peptide: >35000 pg/mL — ABNORMAL HIGH

## 2024-03-20 MED ORDER — LACTATED RINGERS IV SOLN: INTRAVENOUS | Status: DC

## 2024-03-20 MED ORDER — THIAMINE MONONITRATE 100 MG PO TABS
100.0000 mg | ORAL_TABLET | Freq: Every day | ORAL | Status: DC
  Administered 2024-03-21 – 2024-03-23 (×3): 100 mg
  Filled 2024-03-20 (×3): qty 1

## 2024-03-20 MED ORDER — DIATRIZOATE MEGLUMINE & SODIUM 66-10 % PO SOLN
30.0000 mL | Freq: Once | ORAL | Status: AC
  Administered 2024-03-20: 30 mL

## 2024-03-20 MED ORDER — BACITRACIN ZINC 500 UNIT/GM EX OINT
TOPICAL_OINTMENT | Freq: Three times a day (TID) | CUTANEOUS | Status: DC
  Administered 2024-03-20 – 2024-03-23 (×10): 31.1111 via TOPICAL
  Filled 2024-03-20: qty 28.4

## 2024-03-20 MED ORDER — OSMOLITE 1.5 CAL PO LIQD
1000.0000 mL | ORAL | Status: DC
  Administered 2024-03-20 – 2024-03-22 (×3): 1000 mL
  Filled 2024-03-20 (×3): qty 1000

## 2024-03-20 MED ORDER — PROSOURCE TF20 ENFIT COMPATIBL EN LIQD: 60.0000 mL | Freq: Every day | ENTERAL | Status: DC

## 2024-03-20 NOTE — Evaluation (Signed)
 Physical Therapy Evaluation Patient Details Name: John Walton MRN: [id number] DOB: [birth year] Today's Date: 03/20/2024  History of Present Illness  89yo male admitted from home 03/18/24 with weakness, cough due to multifocal PNA. CXR: Patchy opacities in RML, LUL. PMH: HOH, confusion, HTN, HLD, GERD, DM, CKD4, CAD, CVA, aortic stenosis, hypothyroidism, glaucoma, BPH s/p TURP, anemia, G-tube for hydration.   Clinical Impression  Pt is currently mobilizing below his baseline due to weakness, balance, and gait deficits. Pt required maxA for bed mobility and modA for STS and step pivot to chair for functional mobility. Pt demonstrates crouched posture and is unsteady in seated and standing balance tasks. Pt with productive cough throughout session, dislodging thick yellow/green mucus. Pt initially at 6L but was able to be weaned to 1L O2 with good tolerance. At baseline, pt is supervision for functional mobility and would benefit from continued PT services focused on strength, balance, transfers, and gait to promote return to PLOF. Pt appropriate for <3 hrs rehab prior to going home due to level of assist needed at this time.         If plan is discharge home, recommend the following: A lot of help with bathing/dressing/bathroom;A lot of help with walking and/or transfers;Assistance with cooking/housework;Direct supervision/assist for medications management;Direct supervision/assist for financial management;Assist for transportation;Help with stairs or ramp for entrance;Supervision due to cognitive status   Can travel by private vehicle   No    Equipment Recommendations None recommended by PT  Recommendations for Other Services       Functional Status Assessment Patient has had a recent decline in their functional status and demonstrates the ability to make significant improvements in function in a reasonable and predictable amount of time.     Precautions / Restrictions  Precautions Precautions: Fall Recall of Precautions/Restrictions: Impaired Restrictions Weight Bearing Restrictions Per Provider Order: No      Mobility  Bed Mobility Overal bed mobility: Needs Assistance Bed Mobility: Supine to Sit     Supine to sit: Max assist     General bed mobility comments: Significant trunk and LE support needed for bed mobility. Pt initially requiring modA trunk support while sitting EOB, improves to CGA with time.    Transfers Overall transfer level: Needs assistance Equipment used: Rolling walker (2 wheels) Transfers: Sit to/from Stand, Bed to chair/wheelchair/BSC Sit to Stand: Mod assist   Step pivot transfers: Mod assist       General transfer comment: Assist required due to weakness and need for sequencing and line management when stepping to chair. Cues for hand placement during STS.    Ambulation/Gait                  Stairs            Wheelchair Mobility     Tilt Bed    Modified Rankin (Stroke Patients Only)       Balance Overall balance assessment: Needs assistance Sitting-balance support: Bilateral upper extremity supported Sitting balance-Leahy Scale: Fair Sitting balance - Comments: Pt initially requiring modA for trunk support, improves with cues and time to CGA. Pt tolerates ~10 minutes sitting EOB. Postural control: Posterior lean Standing balance support: Bilateral upper extremity supported Standing balance-Leahy Scale: Fair Standing balance comment: Pt benefits from use of rolling walker for balance. Knees flexed in stance.                             Pertinent Vitals/Pain Pain  Assessment Pain Assessment: No/denies pain    Home Living Family/patient expects to be discharged to:: Private residence Living Arrangements: Spouse/significant other (plus step son) Available Help at Discharge: Family;Personal care attendant;Available 24 hours/day Type of Home: House Home Access: Ramped  entrance       Home Layout: Two level Home Equipment: Rolling Walker (2 wheels);BSC/3in1;Shower seat;Grab bars - tub/shower;Grab bars - toilet Additional Comments: Pt has stair chair to reach 88nd floor Care giver reports pt stays upstairs unless he has to leave the house  for doctor appointments.    Prior Function Prior Level of Function : Needs assist       Physical Assist : Mobility (physical);ADLs (physical)   ADLs (physical): Grooming;Bathing;Dressing;Toileting;IADLs Mobility Comments: Pt mostly completing mobility with supervision. ADLs Comments: Caregiver assists with all ADLs and IADLs. Pt reports he does what he can.     Extremity/Trunk Assessment   Upper Extremity Assessment Upper Extremity Assessment: Defer to OT evaluation    Lower Extremity Assessment Lower Extremity Assessment: Overall WFL for tasks assessed;Generalized weakness (4/5 MMT, sensation WNL)    Cervical / Trunk Assessment Cervical / Trunk Assessment: Kyphotic  Communication   Communication Communication: Impaired Factors Affecting Communication: Hearing impaired    Cognition Arousal: Alert Behavior During Therapy: WFL for tasks assessed/performed   PT - Cognitive impairments: Awareness, Safety/Judgement, Attention, Problem solving                         Following commands: Intact       Cueing Cueing Techniques: Verbal cues, Tactile cues, Visual cues     General Comments General comments (skin integrity, edema, etc.): VSS throughout. Pt initially on 6L and satting 99%, pt able to be weaned to 1L and maintain 97%. RN updated, pt tolerating well. Bruising and abraision noted on L shin from recent fall.    Exercises     Assessment/Plan    PT Assessment Patient needs continued PT services  PT Problem List Decreased strength;Decreased mobility;Decreased safety awareness;Decreased range of motion;Decreased coordination;Decreased knowledge of precautions;Decreased activity  tolerance;Decreased balance;Decreased knowledge of use of DME       PT Treatment Interventions DME instruction;Therapeutic exercise;Gait training;Balance training;Stair training;Functional mobility training;Therapeutic activities;Patient/family education    PT Goals (Current goals can be found in the Care Plan section)  Acute Rehab PT Goals Patient Stated Goal: Walk without walker PT Goal Formulation: With patient Time For Goal Achievement: 04/03/24 Potential to Achieve Goals: Fair    Frequency Min 2X/week     Co-evaluation               AM-PAC PT 6 Clicks Mobility  Outcome Measure Help needed turning from your back to your side while in a flat bed without using bedrails?: A Lot Help needed moving from lying on your back to sitting on the side of a flat bed without using bedrails?: A Lot Help needed moving to and from a bed to a chair (including a wheelchair)?: A Lot Help needed standing up from a chair using your arms (e.g., wheelchair or bedside chair)?: A Lot Help needed to walk in hospital room?: Total Help needed climbing 3-5 steps with a railing? : Total 6 Click Score: 10    End of Session Equipment Utilized During Treatment: Gait belt Activity Tolerance: Patient limited by fatigue Patient left: in chair;with call bell/phone within reach;with chair alarm set;with family/visitor present Nurse Communication: Mobility status PT Visit Diagnosis: Unsteadiness on feet (R26.81);Muscle weakness (generalized) (M62.81)  Time: 0925-1000 PT Time Calculation (min) (ACUTE ONLY): 35 min   Charges:   PT Evaluation $PT Eval Moderate Complexity: 1 Mod PT Treatments $Therapeutic Activity: 8-22 mins PT General Charges $$ ACUTE PT VISIT: 1 Visit         Sabra Morel, PT, DPT  Acute Rehabilitation Services         Office: [phone]     Sabra MARLA Morel 03/20/2024, 4:34 PM

## 2024-03-20 NOTE — Progress Notes (Signed)
 Initial Nutrition Assessment  DOCUMENTATION CODES:   Not applicable  INTERVENTION:   Monitor for leakage around PEG insertion site post initiation of TF  Tube Feeding via PEG: Osmolite 1.5 at 45 ml/hr Begin TF at 25 ml/hr, titrate by 10 mL q 12 hours until goal rate of 45 ml/hr TF provides 1620 kcals, 68 g of protein, 820 mL of free water   Check phosphorus as add-on to AM labs. Recommend supplementing today if low. Recommend magnesium , potassium, and phosphorus daily for at least 3 days, MD to replete as needed, given possible refeeding risk.  Add Thiamine  100 mg daily x 7 days  Request for measured weight for this admission sent to RN. Recommend daily weights  Recommend no additional free water  flushes (except flushes to maintain tube patency and flushes before and after medication administration) at this time secondary to hypervolemia, receiving gentle IV hydration. ProBNP >35000, some edema present Additional Free Water  Flush once appropriate: 200 mL QID (total 800 mL in 24 hours)  If continues without BM post initiation of TF, recommend considering addition of bowel regimen  Recommend transition to bolus feedings once tolerating TF at goal rate, especially if planning to discharge home on TF. Recommendations below  Bolus TF Recommendations:  5 Cartons of Osmolite 1.5 daily.  Each carton contains 237 mL of formula. Total volume daily: 1185 mL  Recommend no more than 4 feedings per day if feasible.  Possible bolus regimens include: 3 Bolus Feedings of Osmolite 1.5 with the first 2 feedings providing 2 cartons (474 mL) and last feeding providing 1 carton (237 mL)   NUTRITION DIAGNOSIS:   Inadequate oral intake related to dysphagia, acute illness, decreased appetite as evidenced by NPO status.  GOAL:   Patient will meet greater than or equal to 90% of their needs  MONITOR:   Diet advancement, TF tolerance, Labs, I & O's, Weight trends  REASON FOR ASSESSMENT:    Consult Enteral/tube feeding initiation and management  ASSESSMENT:   89 yo male admitted with weakness and cough  with AKI on CKD 4 with metabolic acidosis and electrolyte derangements, +acute respiratory failure with pneumonia, elevated BNP with hx of HF and aortic stenosis with hyponatremia with edema present on exam. PMH includes chronic systolic heart failure, severe aortic stenosis, DM, GERD, HTN, HLD, CKD 4, CAD, CVA, G-tube placement, vascular dementia with gradual decline in mental status, HoH (speak loudly into L. Ear)  Currently NPO, hx of dysphagia, SLP consulted. Noted MBS in October of this year with recommendations for Regular diet with Thin Liquids.   G-tube place for years; currently used for free water  for hydration and for supplemental nutrition as needed. Per Inpatient RD assessment on admission in October of this year; pt only using G-tube for hydration. Pt was doing pro-source protein modular per tube however for extra protein.   RD notes image of G-tube insertion site in MD note, +redness and some discharge. Noted IR has been consulted by MD. Need to monitor for leakage around the site with initiation of TF  Pt with vascular dementia with gradual decline in mental status. Noted pt is HoH, speaking into L Ear loudly recommended per MD note.   Height and Weight are from 01/19/24. Need new weight for this admission. Recommend daily weights. +edema per RN assessment; UBW and estimated dry weight not known at present.   Hyponatremia, MD indicating likely secondary to hypervolemia.   LR at 75 ml/hr currently  On admission in October 2025, pt met clinical  characteristics for malnutrition. Suspect malnutrition is ongoing but will assess further on follow-up  Labs: Sodium 127 (L) BUN 75 Creatinine 2.59 Potassium 4.7 (wdl) Magnesium  2.1 (wdl) Phosphorus not checked (plan to order as add-on to AM labs) ProBNP >35,000 (H) CRP 21.7 (H) CBGs 109-156 (goal <180 with  avoidance of hypoglycemic episodes)  Meds: SS novolog   NUTRITION - FOCUSED PHYSICAL EXAM:  Assess on follow-up  Diet Order:   Diet Order             Diet NPO time specified  Diet effective now                   EDUCATION NEEDS:   Not appropriate for education at this time  Skin:  Skin Assessment: Reviewed RN Assessment  Last BM:  PTA  Height:   Ht Readings from Last 1 Encounters:  01/19/24 5' 6 (1.676 m)    Weight:  no measured weight this admission  Wt Readings from Last 1 Encounters:  01/19/24 71.1 kg    BMI:  There is no height or weight on file to calculate BMI.  Estimated Nutritional Needs:   Kcal:  1550-1750 kcals  Protein:  65-80 g  Fluid:  1.5L   Betsey Finger MS, RDN, LDN, CNSC Registered Dietitian 3 Clinical Nutrition RD Inpatient Contact Info in Amion

## 2024-03-20 NOTE — Progress Notes (Signed)
 [facility] night cross cover note:   I was notified by the patient's RN that this patient, who is currently on continuous tube feeds to every 4 hours CBG monitoring, is refusing sliding scale insulin , including after RN-provided pt educations.  Most recent CBG noted to be 134.   Failed his swallow evaluation today. Current order for NPO updated to reflect NPO with that following exceptions: Okay to administer PO medications and tube feeds through PEG tube.    Eva Pore, DO Hospitalist

## 2024-03-20 NOTE — Progress Notes (Addendum)
" PROGRESS NOTE    John Walton  FMW:[id number] DOB: [birth year] DOA: 03/18/2024 PCP: Waymond Ryan Askew, MD  Chief Complaint  Patient presents with   Weakness    Brief Narrative:   John Walton is a 89 y.o. male with medical history significant of hypertension, hyperlipidemia, GERD, diabetes, CKD 4, carotid artery disease, CVA, aortic stenosis, hypothyroidism, glaucoma, BPH status post TURP, anemia, status post G-tube presenting with weakness and cough.   Admitted for multifocal pneumonia and metabolic abnormalities.    Assessment & Plan:    Hyponatremia Hyperkalemia Elevated gap metabolic acidosis AKI on CKD 4 > Due to dehydration improving with IV fluids, patient has history of consuming less fluids and not drinking enough water , dependent on free water  flushes.  Renal ultrasound stable, continue to monitor electrolytes and renal function.  Pneumonia Acute respiratory failure with hypoxia CXR with multifocal pneumonia, trace R effusion Negative covid, flu, RSV Urine strep, urine legionella, sputum culture Continue ceftriaxone , azithromycin  Wean O2 as tolerated  Chronic anemia Downtrending in setting of receiving IVF Will trend  Status post G-tube History of dysphagia Allergy to cough medications > Patient is status post G-tube years ago per family; when he had an allergic reaction to cough medicine causing narrowing of his airway/esophagus.  Was on this for some time and then had improvement was able to swallow better. > Has had intermittent issues with dysphagia and has had aspiration pneumonia in the past and so has maintained G-tube in place to use for supplemental nutrition or as needed. > Swallow study was performed in October of this year and recommendation was for p.o. diet, regular.  Thin liquids.  Medication to be administered by alternative means. - Have speech therapy see him to advance his oral diet as appropriate, there is some redness around the PEG tube  site for which IR has been consulted.  Topical cream bacitracin  will be added.   Troponin elevation Appreciate cards assistance, thought due demand in the setting of pneumonia, metabolic derangement, lactic acidosis, AKI - no further cardiac   Vascular dementia Hard of Hearing (speak loudly into his L ear) > Family reports patient had a gradual decline in his mental status especially over the past 6 months to a year.   Delirium precautions   Severe Aortic stenosis Mitral stenosis HFmrEF  Low normal RVSF > Echo in October showed moderate mitral stenosis and severe aortic stenosis with EF 40-45%. Caution with IVF, currently getting IVF with suspected hypovolemic hyponatremia   Hyperthyroidism methimazole    Hyperlipidemia - Continue home atorvastatin    Carotid artery disease History of CVA - Continue home ASA, atorvastatin    Glaucoma  - Continue eyedrops  Diabetes - SSI   CBG (last 3)  Recent Labs    03/20/24 0025 03/20/24 0419 03/20/24 0740  GLUCAP 136* 120* 109*       DVT prophylaxis: SCD Code Status: DNR Family Communication: Updated wife [phone]  in detail on 03/20/2024 Disposition:   Status is: Inpatient Remains inpatient appropriate because: need for continued inpatient care   Consultants:  IR  Procedures:  none  Antimicrobials:  Anti-infectives (From admission, onward)    Start     Dose/Rate Route Frequency Ordered Stop   03/19/24 1000  cefTRIAXone  (ROCEPHIN ) 2 g in sodium chloride  0.9 % 100 mL IVPB        2 g 200 mL/hr over 30 Minutes Intravenous Every 24 hours 03/18/24 1543 03/02/2024 0959   03/19/24 1000  azithromycin  (ZITHROMAX ) 500 mg in sodium  chloride 0.9 % 250 mL IVPB        500 mg 250 mL/hr over 60 Minutes Intravenous Every 24 hours 03/18/24 1543 03/22/2024 0959   03/18/24 1430  cefTRIAXone  (ROCEPHIN ) 2 g in sodium chloride  0.9 % 100 mL IVPB        2 g 200 mL/hr over 30 Minutes Intravenous Once 03/18/24 1423 03/18/24 1505    03/18/24 1430  azithromycin  (ZITHROMAX ) 500 mg in sodium chloride  0.9 % 250 mL IVPB        500 mg 250 mL/hr over 60 Minutes Intravenous  Once 03/18/24 1423 03/18/24 1631       Subjective:  Hard of hearing Denies complaints at this time  Objective: Vitals:   03/19/24 2018 03/20/24 0000 03/20/24 0400 03/20/24 0739  BP:  115/67 116/64 (!) 110/56  Pulse:  (!) 101 93 93  Resp:  19 19 16   Temp: 97.8 F (36.6 C) 97.6 F (36.4 C) 98.3 F (36.8 C) 98 F (36.7 C)  TempSrc: Axillary Oral Oral Oral  SpO2:  96% 97% 96%   No intake or output data in the 24 hours ending 03/20/24 0958  There were no vitals filed for this visit.  Examination:  Awake Alert, No new F.N deficits, Normal affect, ++ hearing loss, PEG in place some redness and discharge around the site Vivian.AT,PERRAL Supple Neck, No JVD,   Symmetrical Chest wall movement, Good air movement bilaterally, CTAB RRR,No Gallops, Rubs or new Murmurs,  +ve B.Sounds, Abd Soft, No tenderness,   No Cyanosis, Clubbing or edema      Data Review:   Patient Lines/Drains/Airways Status     Active Line/Drains/Airways     Name Placement date Placement time Site Days   Peripheral IV 03/18/24 20 G Left Antecubital 03/18/24  1327  Antecubital  2   Peripheral IV 03/18/24 22 G 1.75 Anterior;Distal;Left Forearm 03/18/24  1516  Forearm  2   Gastrostomy/Enterostomy Gastrostomy 20 Fr. LUQ 03/29/20  1223  LUQ  1452   External Urinary Catheter 03/19/24  0109  --  1             Inpatient Medications  Scheduled Meds:  artificial tears   Both Eyes BID   aspirin  EC  81 mg Oral Daily   atorvastatin   40 mg Oral QHS   brinzolamide   1 drop Both Eyes BID   And   brimonidine   1 drop Both Eyes BID   insulin  aspart  0-9 Units Subcutaneous Q4H   latanoprost   1 drop Both Eyes QHS   Lifitegrast   1 drop Both Eyes BID   methimazole   5 mg Oral Daily   prednisoLONE  acetate  1 drop Left Eye Daily   sodium chloride  flush  3 mL Intravenous Q12H    Continuous Infusions:  azithromycin  Stopped (03/19/24 1057)   cefTRIAXone  (ROCEPHIN )  IV 2 g (03/20/24 0921)   PRN Meds:.acetaminophen  **OR** acetaminophen , metoprolol  tartrate  DVT Prophylaxis  Place and maintain sequential compression device Start: 03/19/24 0914 Place and maintain sequential compression device Start: 03/19/24 0356   Recent Labs  Lab 03/18/24 1320 03/19/24 0326 03/19/24 1413 03/19/24 1919  WBC 14.7* 7.6  --  6.6  HGB 9.6* 7.7* 7.7* 8.0*  HCT 28.6* 24.3* 23.6* 24.3*  PLT 363 292  --  330  MCV 88.8 92.4  --  91.4  MCH 29.8 29.3  --  30.1  MCHC 33.6 31.7  --  32.9  RDW 12.9 13.0  --  13.2  LYMPHSABS 0.3*  --   --   --  MONOABS 0.6  --   --   --   EOSABS 0.0  --   --   --   BASOSABS 0.0  --   --   --     Recent Labs  Lab 03/18/24 1320 03/18/24 1351 03/18/24 1415 03/18/24 1528 03/18/24 1928 03/19/24 0326 03/19/24 0415 03/19/24 0730 03/19/24 1413 03/19/24 1919 03/19/24 2144 03/20/24 0736  NA 117*  --   --  120*   < > 121*  --  125* 125* 126* 127*  --   K 5.4*  --   --  5.3*   < > 4.9  --  4.9 4.7 4.5 4.7  --   CL 89*  --   --  93*   < > 96*  --  99 98 100 99  --   CO2 13*  --   --  12*   < > 11*  --  14* 14* 11* 10*  --   ANIONGAP 15  --   --  15   < > 14  --  13 13 15  18*  --   GLUCOSE 213*  --   --  209*   < > 153*  --  154* 127* 112* 151*  --   BUN 72*  --   --  71*   < > 73*  --  74* 72* 74* 75*  --   CREATININE 2.81*  --   --  2.69*   < > 2.59*  --  2.57* 2.53* 2.47* 2.59*  --   AST 29  --   --   --   --   --   --   --   --   --   --   --   ALT 11  --   --   --   --   --   --   --   --   --   --   --   ALKPHOS 104  --   --   --   --   --   --   --   --   --   --   --   BILITOT 0.5  --   --   --   --   --   --   --   --   --   --   --   ALBUMIN  3.7  --   --   --   --   --   --   --   --   --   --   --   CRP  --   --   --   --   --   --   --   --   --   --   --  21.7*  PROCALCITON  --   --   --  2.16  --   --   --   --   --   --  1.64   --   LATICACIDVEN  --  2.0*  --   --   --   --   --   --  1.3 1.5  --   --   INR  --   --  1.2  --   --   --  1.2  --   --   --   --   --   MG  --   --   --   --   --   --   --   --   --   --  2.1  --   CALCIUM  9.0  --   --  8.2*   < > 8.0*  --  8.2* 8.0* 8.1* 8.1*  --    < > = values in this interval not displayed.      Recent Labs  Lab 03/18/24 1351 03/18/24 1415 03/18/24 1528 03/18/24 1928 03/19/24 0326 03/19/24 0415 03/19/24 0730 03/19/24 1413 03/19/24 1919 03/19/24 2144 03/20/24 0736  CRP  --   --   --   --   --   --   --   --   --   --  21.7*  PROCALCITON  --   --  2.16  --   --   --   --   --   --  1.64  --   LATICACIDVEN 2.0*  --   --   --   --   --   --  1.3 1.5  --   --   INR  --  1.2  --   --   --  1.2  --   --   --   --   --   MG  --   --   --   --   --   --   --   --   --  2.1  --   CALCIUM   --   --  8.2*   < > 8.0*  --  8.2* 8.0* 8.1* 8.1*  --    < > = values in this interval not displayed.    --------------------------------------------------------------------------------------------------------------- Lab Results  Component Value Date   CHOL 114 12/19/2020   HDL 36 (L) 12/19/2020   LDLCALC 58 12/19/2020   LDLDIRECT 137.0 07/11/2014   TRIG 99 12/19/2020   CHOLHDL 3.2 12/19/2020    Lab Results  Component Value Date   HGBA1C 6.5 (H) 12/19/2020   No results for input(s): TSH, T4TOTAL, FREET4, T3FREE, THYROIDAB in the last 72 hours. Recent Labs    03/19/24 0415  FERRITIN 792*  TIBC 182*  IRON 26*   ------------------------------------------------------------------------------------------------------------------ Cardiac Enzymes No results for input(s): CKMB, TROPONINI, MYOGLOBIN in the last 168 hours.  Invalid input(s): CK  Micro Results Recent Results (from the past 240 hours)  Resp panel by RT-PCR (RSV, Flu A&B, Covid) Anterior Nasal Swab     Status: None   Collection Time: 03/18/24  1:20 PM   Specimen: Anterior Nasal Swab   Result Value Ref Range Status   SARS Coronavirus 2 by RT PCR NEGATIVE NEGATIVE Final   Influenza A by PCR NEGATIVE NEGATIVE Final   Influenza B by PCR NEGATIVE NEGATIVE Final    Comment: (NOTE) The Xpert Xpress SARS-CoV-2/FLU/RSV plus assay is intended as an aid in the diagnosis of influenza from Nasopharyngeal swab specimens and should not be used as a sole basis for treatment. Nasal washings and aspirates are unacceptable for Xpert Xpress SARS-CoV-2/FLU/RSV testing.  Fact Sheet for Patients: bloggercourse.com  Fact Sheet for Healthcare Providers: seriousbroker.it  This test is not yet approved or cleared by the [place]  FDA and has been authorized for detection and/or diagnosis of SARS-CoV-2 by FDA under an Emergency Use Authorization (EUA). This EUA will remain in effect (meaning this test can be used) for the duration of the COVID-19 declaration under Section 564(b)(1) of the Act, 21 U.S.C. section 360bbb-3(b)(1), unless the authorization is terminated or revoked.     Resp Syncytial Virus by PCR NEGATIVE NEGATIVE Final    Comment: (NOTE) Fact Sheet for Patients: bloggercourse.com  Fact Sheet for Healthcare Providers: seriousbroker.it  This test is not yet approved or cleared by the [place]  FDA and has been authorized for detection and/or diagnosis of SARS-CoV-2 by FDA under an Emergency Use Authorization (EUA). This EUA will remain in effect (meaning this test can be used) for the duration of the COVID-19 declaration under Section 564(b)(1) of the Act, 21 U.S.C. section 360bbb-3(b)(1), unless the authorization is terminated or revoked.  Performed at [facility], [address], [place], [place] [address]   Blood Culture (routine x 2)     Status: None (Preliminary result)   Collection Time: 03/18/24  2:15 PM   Specimen: BLOOD  Result Value Ref Range  Status   Specimen Description BLOOD LEFT ANTECUBITAL  Final   Special Requests   Final    BOTTLES DRAWN AEROBIC AND ANAEROBIC Blood Culture adequate volume   Culture   Final    NO GROWTH 2 DAYS Performed at [facility], [address] [place], [place] [address]    Report Status PENDING  Incomplete  Blood Culture (routine x 2)     Status: None (Preliminary result)   Collection Time: 03/19/24  2:13 PM   Specimen: BLOOD  Result Value Ref Range Status   Specimen Description BLOOD RIGHT ANTECUBITAL  Final   Special Requests   Final    BOTTLES DRAWN AEROBIC AND ANAEROBIC Blood Culture adequate volume   Culture   Final    NO GROWTH < 24 HOURS Performed at [facility], [address] [place], [place] [address]    Report Status PENDING  Incomplete  MRSA Next Gen by PCR, Nasal     Status: None   Collection Time: 03/19/24  6:01 PM   Specimen: Nasal Mucosa; Nasal Swab  Result Value Ref Range Status   MRSA by PCR Next Gen NOT DETECTED NOT DETECTED Final    Comment: (NOTE) The GeneXpert MRSA Assay (FDA approved for NASAL specimens only), is one component of a comprehensive MRSA colonization surveillance program. It is not intended to diagnose MRSA infection nor to guide or monitor treatment for MRSA infections. Test performance is not FDA approved in patients less than 79 years old. Performed at [facility], [address], [place], [place] [address]     Radiology Reports  US  RENAL Result Date: 03/20/2024 EXAM: US  Retroperitoneum Complete, Renal. 03/20/2024 08:25:00 AM TECHNIQUE: Real-time ultrasonography of the retroperitoneum renal was performed. COMPARISON: US  Renal 03/06/2022. CLINICAL HISTORY: AKI (acute kidney injury). FINDINGS: FINDINGS: RIGHT KIDNEY/URETER: Right kidney measures 8.9 x 3.8 x 4.9 cm with a volume of 85.7 cc. Normal cortical echogenicity. No hydronephrosis. No calculus. No mass. Right ureteral jet is visualized. LEFT KIDNEY/URETER: Left kidney  measures 8.9 x 5.3 x 4.4 cm with a volume of 109.8 cc. Normal cortical echogenicity. No hydronephrosis. No calculus. No mass. Left ureteral jet is visualized. Incidental note of upper pole left kidney cyst measuring 8 mm. BLADDER: Unremarkable appearance of the bladder. OTHER FINDINGS: Incidental right pleural effusion, moderate. IMPRESSION: 1. No acute findings. Electronically signed by: Waddell Calk MD 03/20/2024 08:58 AM EST RP Workstation: [id number]   DG Chest Port 1 View Result Date: 03/20/2024 EXAM: 1 VIEW(S) XRAY OF THE CHEST 03/20/2024 07:20:00 AM COMPARISON: 03/19/2024 CLINICAL HISTORY: SOB (shortness of breath) FINDINGS: LUNGS AND PLEURA: Patchy airspace opacities in left mid and lower lung, slightly worsened. Stable patchy airspace opacities in right mid and lower lung. Small left pleural effusion. No pneumothorax. HEART AND  MEDIASTINUM: Aortic atherosclerosis. No acute abnormality of the cardiac and mediastinal silhouettes. BONES AND SOFT TISSUES: No acute osseous abnormality. IMPRESSION: 1. Patchy airspace opacities in left mid and lower lung, slightly worsened. 2. Stable patchy airspace opacities in right mid and lower lung. 3. Small left pleural effusion. 4. Aortic atherosclerosis. Electronically signed by: Evalene Coho MD 03/20/2024 07:42 AM EST RP Workstation: [id number]   DG Chest Port 1 View Result Date: 03/19/2024 CLINICAL DATA:  Short of breath chest pain EXAM: PORTABLE CHEST 1 VIEW COMPARISON:  03/18/2024, 02/11/2024 FINDINGS: Stable cardiomediastinal silhouette with aortic atherosclerosis. Slight interval worsening of patchy bilateral airspace opacities. No pneumothorax. Probable trace left effusion IMPRESSION: Slight interval worsening of patchy bilateral airspace opacities, most likely multifocal pneumonia. Electronically Signed   By: Luke Bun M.D.   On: 03/19/2024 21:28   DG Chest 2 View Result Date: 03/18/2024 EXAM: 2 VIEW(S) XRAY OF THE CHEST 03/18/2024 01:55:00 PM  COMPARISON: 02/11/2024 CLINICAL HISTORY: cough and shortness of breath FINDINGS: LUNGS AND PLEURA: Patchy opacities in right mid lung and left upper lung. Trace right pleural effusion. No pneumothorax. HEART AND MEDIASTINUM: Aortic atherosclerotic calcification. No acute abnormality of the cardiac silhouette. BONES AND SOFT TISSUES: No acute osseous abnormality. IMPRESSION: 1. Patchy opacities in right mid lung and left upper lung. Compatible with multifocal pneumonia. 2. Trace right pleural effusion. Electronically signed by: Waddell Calk MD 03/18/2024 02:16 PM EST RP Workstation: [id number]      Signature  -   Lavada Stank M.D on 03/20/2024 at 9:58 AM   -  To page go to [email or web address]        "

## 2024-03-20 NOTE — Progress Notes (Signed)
 PEG location KUB reviewed, the new G tube in appropriate position. The tube is ready for immediate use. RN notified.   Please call IR for questions and concerns.   Firas Guardado H Aivy Akter PA-C 03/20/2024 2:44 PM

## 2024-03-20 NOTE — Evaluation (Signed)
 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) 04/01/2020   Sep 12, 2023 Entered By: DARWIN ANES D Comment: Possibly right middle cerebral artery with left hemiparesis and dysphagia   Sep 12, 2023 Entered By: DARWIN ANES D Comment: PEG tube is placed but not used     Chicken pox    Chronic kidney disease    Coronary artery disease    Diabetes mellitus without complication (HCC)    Frequent headaches    Glaucoma    Hay fever    History of blood transfusion    HTN (hypertension)    Hyperlipidemia    Hyperlipidemia    Hypothyroidism    Migraines    Peripheral vascular disease    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(HCC)    Stroke-like symptoms    Past Surgical History:  Past Surgical History:  Procedure Laterality Date   CHOLECYSTECTOMY, LAPAROSCOPIC     ENDARTERECTOMY Right 12/22/2020   Procedure: RIGHT CAROTID ENDARTERECTOMY;  Surgeon: Eliza Lonni RAMAN, MD;  Location: [facility] OR;  Service: Vascular;  Laterality: Right;   IR GASTROSTOMY TUBE MOD SED  03/29/2020   TONSILLECTOMY  04/01/1940   TOTAL HIP ARTHROPLASTY Left 03/08/2020   Procedure: TOTAL HIP ARTHROPLASTY;  Surgeon: Barbarann Anes BROCKS, MD;  Location: [facility] ORS;  Service: Orthopedics;  Laterality: Left;   TRANSURETHRAL RESECTION OF PROSTATE N/A 12/14/2020   Procedure: TRANSURETHRAL RESECTION OF THE PROSTATE (TURP);  Surgeon: Matilda Senior, MD;  Location: [facility] ORS;  Service: Urology;  Laterality: N/A;  2 HRS   HPI:  89yo male admitted from home 03/18/24 with weakness, cough due to multifocal PNA. CXR: Patchy opacities in RML, LUL. SLP:  chronic pharyngeal dysphagia due to DISH, occasional aspiration. PEG for hydration. Regular solids. MBS 01/12/24: pen liquids, solid residue. No lg volume aspiration c/w prior findings. PMH: HOH, confusion, HTN, HLD, GERD, DM, CKD4, CAD, CVA, aortic stenosis, hypothyroidism, glaucoma, BPH s/p TURP, anemia, G-tube for hydration.    Assessment / Plan / Recommendation  Clinical Impression   Pt presented with severe signs of aspiration with thin liquid and given his extensive dysphagia/aspiration h/x, he poses as a large aspiration risk and should remain NPO with all nutrition and medication needs met via his placed PEG. ST to f/u with MBSS during the course of this hospitalization.   Pt was with PA who was replacing his PEG upon arrival. He was generally agreeable but presented as agitated (they're trying to kill me I told you a comment he made to his caregiver). His awareness to hsi deficts were poor (I had no problem eating or drinking until I came here and they didn't let me have anything.), despite his extensive dysphagia hx. His OME was generally WFL, but he was just generally weak and had a hard time understanding/hearing instructions. He had x2 ice chips with no overt s/s of aspiration but when he had x1 small cup sip of thins he had a prolonged productive cough (at least 1-2 minutes) that resulted in expectoration of a moderate amount of thick white secretions.Pt should have his oral cavity cleaned regularly and suctioning available at bedside to reduce the  risk of aspiration of his secretions. SLP to f/u with MBSS.   SLP Visit Diagnosis: Dysphagia, unspecified (R13.10)    Aspiration Risk  Moderate aspiration risk    Diet Recommendation           Other Recommendations Oral Care Recommendations: Oral care QID Caregiver Recommendations: Have oral suction available     Swallow Evaluation Recommendations Recommendations: NPO;Alternative means of nutrition - G Tube Oral care  recommendations: Oral care QID (4x/day) Caregiver Recommendations: Have oral suction available   Assistance Recommended at Discharge    Functional Status Assessment Patient has had a recent decline in their functional status and/or demonstrates limited ability to make significant improvements in function in a reasonable and predictable amount of time  Frequency and Duration min 2x/week  1 week       Prognosis Prognosis for improved oropharyngeal function: Guarded Barriers to Reach Goals: Cognitive deficits;Time post onset;Severity of deficits;Behavior      Swallow Study   General Date of Onset: 03/18/24 HPI: 89yo male admitted from home 03/18/24 with weakness, cough due to multifocal PNA. CXR: Patchy opacities in RML, LUL. 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 PEG for hydration. Regular solids. MBS 01/12/24: pen liquids, solid residue. No lg volume aspiration c/w prior findings. PMH: HOH, confusion, HTN, HLD, GERD, DM, CKD4, CAD, CVA, aortic stenosis, hypothyroidism, glaucoma, BPH s/p TURP, anemia, G-tube for hydration. Type of Study: Bedside Swallow Evaluation Previous Swallow Assessment: MBS 01/12/24: pen liquids, solid residue. No lg volume aspiration c/w prior findings. Diet Prior to this Study: NPO;G-tube Temperature Spikes Noted: No Respiratory Status: Nasal cannula History of Recent Intubation: No Behavior/Cognition: Confused;Agitated;Impulsive;Distractible Oral Cavity Assessment: Dried secretions Oral Care Completed by SLP: Yes Oral Cavity - Dentition: Edentulous Vision:  (not assessed) Patient Positioning: Upright in bed Baseline Vocal Quality: Normal Volitional Cough: Strong    Oral/Motor/Sensory Function Overall Oral Motor/Sensory Function: Mild impairment Facial Strength: Reduced right;Reduced left Lingual Strength: Reduced   Ice Chips Ice chips: Within functional limits Presentation: Spoon   Thin Liquid Thin Liquid:  Impaired Presentation: Cup Pharyngeal  Phase Impairments: Wet Vocal Quality;Cough - Immediate;Decreased hyoid-laryngeal movement    Nectar Thick     Honey Thick     Puree     Solid           Manuelita Blew M.S. CCC-SLP

## 2024-03-20 NOTE — Evaluation (Signed)
 Occupational Therapy Evaluation Patient Details Name: John Walton MRN: [id number] DOB: 11-Mar-1933 Today's Date: 03/20/2024   History of Present Illness   89 yo male admitted from home 03/18/24 with weakness, cough due to multifocal PNA, CXR: patchy opacities in RML & LUL.   Pt has chronic pharyngeal dysphagia due to DISH, occasional aspiration and a PEG for hydration.  PMH includes HOH, confusion, HTN, HLD< GERD< DM< CKD4, CAD,CVA, aortic stenosis, hypothyroidism, glaucoma, BPH s/p TURP, anemia, G-tube for hydration     Clinical Impressions Prior level to admission patient was receiving assist with ADLs from paid CG.  He has all needed equipment in the home.  Overall patient is Max a with adls and mod to max with transfers.  He is rigid with movement and needs multimodal cuing for redirection.  Patient will benefit from OT in the hospital to work on functional transfers and adls.  Recommend potentially home health based on CG ability to assist.  If not then Patient will benefit from continued inpatient follow up therapy, <3 hours/day      If plan is discharge home, recommend the following:   A lot of help with walking and/or transfers;A lot of help with bathing/dressing/bathroom;Assistance with cooking/housework;Assistance with feeding;Assist for transportation;Help with stairs or ramp for entrance     Functional Status Assessment   Patient has had a recent decline in their functional status and demonstrates the ability to make significant improvements in function in a reasonable and predictable amount of time.     Equipment Recommendations   None recommended by OT     Recommendations for Other Services         Precautions/Restrictions   Precautions Precautions: Fall Recall of Precautions/Restrictions: Impaired Restrictions Weight Bearing Restrictions Per Provider Order: No     Mobility Bed Mobility Overal bed mobility: Needs Assistance Bed Mobility: Sit to  Supine       Sit to supine: Mod assist   General bed mobility comments: Needs max assist to adjust in bed    Transfers Overall transfer level: Needs assistance                        Balance Overall balance assessment: Needs assistance Sitting-balance support: No upper extremity supported Sitting balance-Leahy Scale: Good Sitting balance - Comments: Limited ability to reach out of BOS                                   ADL either performed or assessed with clinical judgement   ADL                                               Vision Baseline Vision/History: 1 Wears glasses Ability to See in Adequate Light: 1 Impaired Patient Visual Report: Other (comment) (States he does not have good vision)       Perception         Praxis         Pertinent Vitals/Pain Pain Assessment Pain Assessment: No/denies pain     Extremity/Trunk Assessment Upper Extremity Assessment Upper Extremity Assessment: Generalized weakness;RUE deficits/detail;LUE deficits/detail RUE Deficits / Details: limited ROM in R shoulder LUE Deficits / Details: Limited L shoulder ROM   Lower Extremity Assessment Lower Extremity Assessment: Defer to PT evaluation  Communication Communication Communication: Impaired Factors Affecting Communication: Hearing impaired   Cognition Arousal: Alert Behavior During Therapy: Flat affect Cognition: Cognition impaired   Orientation impairments: Person         OT - Cognition Comments: Pt.needed redirection -                 Following commands: Impaired Following commands impaired: Follows one step commands inconsistently     Cueing  General Comments   Cueing Techniques: Verbal cues;Gestural cues;Tactile cues;Visual cues  Sitter present during evaluation   Exercises     Shoulder Instructions      Home Living Family/patient expects to be discharged to:: Private residence Living  Arrangements: Spouse/significant other Available Help at Discharge: Family;Personal care attendant Type of Home: House Home Access: Ramped entrance     Home Layout: Two level;1/2 bath on main level;Bed/bath upstairs Alternate Level Stairs-Number of Steps: has stair lift   Bathroom Shower/Tub: Producer, Television/film/video: Handicapped height Bathroom Accessibility: Yes How Accessible: Accessible via walker Home Equipment: Rolling Walker (2 wheels);Rollator (4 wheels);Wheelchair - manual;Shower seat;Grab bars - tub/shower;Grab bars - toilet;Toilet riser;Hospital bed          Prior Functioning/Environment Prior Level of Function : Needs assist  Cognitive Assist : ADLs (cognitive) (needs re-direction)   ADLs (Cognitive): Step by step cues         ADLs Comments: aide assists with all bathing and dressing. Able to ambulate with RW    OT Problem List: Decreased strength;Decreased activity tolerance;Impaired balance (sitting and/or standing)   OT Treatment/Interventions: Self-care/ADL training;Therapeutic activities      OT Goals(Current goals can be found in the care plan section)   Acute Rehab OT Goals Patient Stated Goal: To get home OT Goal Formulation: Patient unable to participate in goal setting Time For Goal Achievement: 04/03/24 Potential to Achieve Goals: Good   OT Frequency:  Min 2X/week    Co-evaluation              AM-PAC OT 6 Clicks Daily Activity     Outcome Measure Help from another person eating meals?: A Lot Help from another person taking care of personal grooming?: A Lot Help from another person toileting, which includes using toliet, bedpan, or urinal?: A Lot Help from another person bathing (including washing, rinsing, drying)?: A Lot Help from another person to put on and taking off regular upper body clothing?: A Lot Help from another person to put on and taking off regular lower body clothing?: A Lot 6 Click Score: 12   End of  Session Equipment Utilized During Treatment: Gait belt Nurse Communication: Mobility status  Activity Tolerance: Patient limited by fatigue Patient left: in bed;with call bell/phone within reach;with nursing/sitter in room  OT Visit Diagnosis: Muscle weakness (generalized) (M62.81);Other abnormalities of gait and mobility (R26.89);Unsteadiness on feet (R26.81)                Time: 8861-8794 OT Time Calculation (min): 27 min Charges:  OT General Charges $OT Visit: 1 Visit OT Evaluation $OT Eval Moderate Complexity: 1 Mod OT Treatments $Self Care/Home Management : 8-22 mins  Inocente Browner OTR/L   Inocente SHAUNNA Browner 03/20/2024, 12:35 PM

## 2024-03-20 NOTE — Procedures (Signed)
 PROCEDURE SUMMARY:  Successful exchange of gastrostomy tube.  No complications.   EBL = None   Please see full dictation in imaging section of Epic for procedure details.  IR was requested for G tube site eval for possible infection, the skin around the insertion site does have mild erythremia, non tender, no discharge. Appears inflammation rather than true infection. During the eval it was found that the retention balloon was broken. The existing G tube was exchanged with new 20 Fr G tube at bedside.   Recommend placing a split gauze under the bumper to keep the skin dry.  Please call IR for questions and concerns.    Karey Stucki H Shareena Nusz PA-C 03/20/2024 2:12 PM

## 2024-03-20 NOTE — Progress Notes (Signed)
 SLP Cancellation Note  Patient Details Name: John Walton MRN: [id number] DOB: [birth year]   Cancelled treatment:       Reason Eval/Treat Not Completed: Patient at procedure or test/unavailable. Will continue efforts for clinical swallow evaluation   Mitzie HUNT MA, CCC-SLP Acute Rehabilitation Services   03/20/2024, 8:09 AM

## 2024-03-21 DIAGNOSIS — E871 Hypo-osmolality and hyponatremia: Secondary | ICD-10-CM | POA: Diagnosis not present

## 2024-03-21 LAB — COMPREHENSIVE METABOLIC PANEL WITH GFR
ALT: 16 U/L (ref 0–44)
AST: 21 U/L (ref 15–41)
Albumin: 3 g/dL — ABNORMAL LOW (ref 3.5–5.0)
Alkaline Phosphatase: 104 U/L (ref 38–126)
Anion gap: 15 (ref 5–15)
BUN: 76 mg/dL — ABNORMAL HIGH (ref 8–23)
CO2: 13 mmol/L — ABNORMAL LOW (ref 22–32)
Calcium: 8.2 mg/dL — ABNORMAL LOW (ref 8.9–10.3)
Chloride: 106 mmol/L (ref 98–111)
Creatinine, Ser: 2.38 mg/dL — ABNORMAL HIGH (ref 0.61–1.24)
GFR, Estimated: 25 mL/min — ABNORMAL LOW
Glucose, Bld: 189 mg/dL — ABNORMAL HIGH (ref 70–99)
Potassium: 4.5 mmol/L (ref 3.5–5.1)
Sodium: 134 mmol/L — ABNORMAL LOW (ref 135–145)
Total Bilirubin: <0.2 mg/dL (ref 0.0–1.2)
Total Protein: 6.1 g/dL — ABNORMAL LOW (ref 6.5–8.1)

## 2024-03-21 LAB — GLUCOSE, CAPILLARY
Glucose-Capillary: 166 mg/dL — ABNORMAL HIGH (ref 70–99)
Glucose-Capillary: 181 mg/dL — ABNORMAL HIGH (ref 70–99)
Glucose-Capillary: 221 mg/dL — ABNORMAL HIGH (ref 70–99)
Glucose-Capillary: 260 mg/dL — ABNORMAL HIGH (ref 70–99)
Glucose-Capillary: 276 mg/dL — ABNORMAL HIGH (ref 70–99)
Glucose-Capillary: 277 mg/dL — ABNORMAL HIGH (ref 70–99)
Glucose-Capillary: 325 mg/dL — ABNORMAL HIGH (ref 70–99)

## 2024-03-21 LAB — CBC WITH DIFFERENTIAL/PLATELET
Abs Immature Granulocytes: 0.22 K/uL — ABNORMAL HIGH (ref 0.00–0.07)
Basophils Absolute: 0 K/uL (ref 0.0–0.1)
Basophils Relative: 0 %
Eosinophils Absolute: 0.1 K/uL (ref 0.0–0.5)
Eosinophils Relative: 1 %
HCT: 23.3 % — ABNORMAL LOW (ref 39.0–52.0)
Hemoglobin: 7.8 g/dL — ABNORMAL LOW (ref 13.0–17.0)
Immature Granulocytes: 3 %
Lymphocytes Relative: 6 %
Lymphs Abs: 0.4 K/uL — ABNORMAL LOW (ref 0.7–4.0)
MCH: 30.2 pg (ref 26.0–34.0)
MCHC: 33.5 g/dL (ref 30.0–36.0)
MCV: 90.3 fL (ref 80.0–100.0)
Monocytes Absolute: 0.8 K/uL (ref 0.1–1.0)
Monocytes Relative: 13 %
Neutro Abs: 5 K/uL (ref 1.7–7.7)
Neutrophils Relative %: 77 %
Platelets: 335 K/uL (ref 150–400)
RBC: 2.58 MIL/uL — ABNORMAL LOW (ref 4.22–5.81)
RDW: 13.6 % (ref 11.5–15.5)
WBC: 6.5 K/uL (ref 4.0–10.5)
nRBC: 0 % (ref 0.0–0.2)

## 2024-03-21 LAB — LEGIONELLA PNEUMOPHILA SEROGP 1 UR AG: L. pneumophila Serogp 1 Ur Ag: NEGATIVE

## 2024-03-21 LAB — C-REACTIVE PROTEIN: CRP: 16.2 mg/dL — ABNORMAL HIGH

## 2024-03-21 LAB — MAGNESIUM: Magnesium: 2.3 mg/dL (ref 1.7–2.4)

## 2024-03-21 LAB — PROCALCITONIN: Procalcitonin: 0.99 ng/mL

## 2024-03-21 LAB — PHOSPHORUS: Phosphorus: 4.2 mg/dL (ref 2.5–4.6)

## 2024-03-21 MED ORDER — HYDROCOD POLI-CHLORPHE POLI ER 10-8 MG/5ML PO SUER
5.0000 mL | Freq: Two times a day (BID) | ORAL | Status: DC | PRN
  Administered 2024-03-21 – 2024-03-24 (×4): 5 mL
  Filled 2024-03-21 (×4): qty 5

## 2024-03-21 NOTE — Plan of Care (Signed)

## 2024-03-21 NOTE — Progress Notes (Signed)
 [facility] night cross cover note:   I was notified by the patient's RN that the patient was able to pass 400 cc of urine via condom cath.  Ensuing bladder scan showed residual of 355 cc's.  The patient verbalizes that he would prefer to not undergo urinary catheterization and that he would prefer to pursue spontaneous voiding trial.   Additionally, the patient is requesting medication for cough.  He is n.p.o. with oral meds given through the PEG tube.   Unable to crush or administer Tessalon  Perles via his PEG tube.  I subsequently placed order for prn Tussionex per tube for his cough.    Update: Patient able to spontaneously void 250 cc of urine, with postvoid residual bladder scan revealing 222 cc.  Patient does not feel urgency to urinate additionally at this time.      Eva Pore, DO Hospitalist

## 2024-03-21 NOTE — Progress Notes (Signed)
" PROGRESS NOTE    John Walton  FMW:[id number] DOB: 02-Jun-1932 DOA: 03/18/2024 PCP: Waymond Ryan Askew, MD  Chief Complaint  Patient presents with   Weakness    Brief Narrative:   John Walton is a 89 y.o. male with medical history significant of hypertension, hyperlipidemia, GERD, diabetes, CKD 4, carotid artery disease, CVA, aortic stenosis, hypothyroidism, glaucoma, BPH status post TURP, anemia, status post G-tube presenting with weakness and cough.   Admitted for multifocal pneumonia and metabolic abnormalities.    Assessment & Plan:    Hyponatremia Hyperkalemia Elevated gap metabolic acidosis AKI on CKD 4  Due to dehydration improving with IV fluids, patient has history of consuming less fluids and not drinking enough water , dependent on free water  flushes.  Renal ultrasound stable, continue to monitor electrolytes and renal function.  Pneumonia Acute respiratory failure with hypoxia CXR with multifocal pneumonia, trace R effusion Negative covid, flu, RSV Urine strep, urine legionella, sputum culture Continue ceftriaxone , azithromycin  Encouraged to sit in chair use I-S and flutter valve for pulmonary toiletry, advance activity and titrate down oxygen.  Chronic anemia Downtrending in setting of receiving IVF Will trend  Status post G-tube History of dysphagia Allergy to cough medications > Patient is status post G-tube years ago per family; when he had an allergic reaction to cough medicine causing narrowing of his airway/esophagus.  Was on this for some time and then had improvement was able to swallow better. > Has had intermittent issues with dysphagia and has had aspiration pneumonia in the past and so has maintained G-tube in place to use for supplemental nutrition or as needed. > Swallow study was performed in October of this year and recommendation was for p.o. diet, regular.  Thin liquids.  Medication to be administered by alternative means. - Have speech  therapy see him to advance his oral diet as appropriate, there is some redness around the PEG tube site for which IR is consulted, tube actually had malfunctioned with the balloon likely punctured, tube was exchanged on 03/20/2024, now functioning well, overall stable continue Topical cream bacitracin  around the tube site for some local irritation.   Troponin elevation Appreciate cards assistance, thought due demand in the setting of pneumonia, metabolic derangement, lactic acidosis, AKI - no further cardiac   Vascular dementia Hard of Hearing (speak loudly into his L ear) > Family reports patient had a gradual decline in his mental status especially over the past 6 months to a year.   Delirium precautions   Severe Aortic stenosis Mitral stenosis HFmrEF  Low normal RVSF > Echo in October showed moderate mitral stenosis and severe aortic stenosis with EF 40-45%. Caution with IVF, currently getting IVF with suspected hypovolemic hyponatremia   Hyperthyroidism methimazole    Hyperlipidemia - Continue home atorvastatin    Carotid artery disease History of CVA - Continue home ASA, atorvastatin    Glaucoma  - Continue eyedrops  Diabetes - SSI   CBG (last 3)  Recent Labs    03/21/24 0030 03/21/24 0413 03/21/24 0847  GLUCAP 166* 181* 221*       DVT prophylaxis: SCD Code Status: DNR Family Communication: Updated wife [phone]  in detail on 03/20/2024, 03/21/24 Disposition:   Status is: Inpatient Remains inpatient appropriate because: need for continued inpatient care   Consultants:  IR  Procedures:  none  Antimicrobials:  Anti-infectives (From admission, onward)    Start     Dose/Rate Route Frequency Ordered Stop   03/19/24 1000  cefTRIAXone  (ROCEPHIN ) 2 g in sodium  chloride 0.9 % 100 mL IVPB        2 g 200 mL/hr over 30 Minutes Intravenous Every 24 hours 03/18/24 1543 03/02/2024 0959   03/19/24 1000  azithromycin  (ZITHROMAX ) 500 mg in sodium chloride  0.9 % 250  mL IVPB        500 mg 250 mL/hr over 60 Minutes Intravenous Every 24 hours 03/18/24 1543 03/06/2024 0959   03/18/24 1430  cefTRIAXone  (ROCEPHIN ) 2 g in sodium chloride  0.9 % 100 mL IVPB        2 g 200 mL/hr over 30 Minutes Intravenous Once 03/18/24 1423 03/18/24 1505   03/18/24 1430  azithromycin  (ZITHROMAX ) 500 mg in sodium chloride  0.9 % 250 mL IVPB        500 mg 250 mL/hr over 60 Minutes Intravenous  Once 03/18/24 1423 03/18/24 1631       Subjective:  Hard of hearing Denies complaints at this time  Objective: Vitals:   03/20/24 2000 03/21/24 0000 03/21/24 0400 03/21/24 0500  BP: 110/63 102/78 (!) 104/58   Pulse: (!) 103 97 98   Resp: 17 20 18    Temp: (!) 97.5 F (36.4 C) (!) 97.3 F (36.3 C) 98.6 F (37 C)   TempSrc: Oral Oral Oral   SpO2: 96% 94% 95%   Weight:    101.2 kg    Intake/Output Summary (Last 24 hours) at 03/21/2024 1033 Last data filed at 03/21/2024 0428 Gross per 24 hour  Intake 874.54 ml  Output 1400 ml  Net -525.46 ml    Filed Weights   03/21/24 0500  Weight: 101.2 kg    Examination:  Awake Alert, No new F.N deficits, Normal affect, ++ hearing loss, PEG in place some redness and discharge around the site [place].AT,PERRAL Supple Neck, No JVD,   Symmetrical Chest wall movement, Good air movement bilaterally, CTAB RRR,No Gallops, Rubs or new Murmurs,  +ve B.Sounds, Abd Soft, No tenderness,   No Cyanosis, Clubbing or edema      Data Review:   Patient Lines/Drains/Airways Status     Active Line/Drains/Airways     Name Placement date Placement time Site Days   Peripheral IV 03/18/24 20 G Left Antecubital 03/18/24  1327  Antecubital  3   Peripheral IV 03/18/24 22 G 1.75 Anterior;Distal;Left Forearm 03/18/24  1516  Forearm  3   Gastrostomy/Enterostomy Gastrostomy 20 Fr. LUQ 03/29/20  1223  LUQ  1453   External Urinary Catheter 03/19/24  0109  --  2           Inpatient Medications  Scheduled Meds:  artificial tears   Both Eyes BID    aspirin  EC  81 mg Oral Daily   atorvastatin   40 mg Oral QHS   bacitracin    Topical TID   brinzolamide   1 drop Both Eyes BID   And   brimonidine   1 drop Both Eyes BID   insulin  aspart  0-9 Units Subcutaneous Q4H   latanoprost   1 drop Both Eyes QHS   Lifitegrast   1 drop Both Eyes BID   methimazole   5 mg Oral Daily   prednisoLONE  acetate  1 drop Left Eye Daily   sodium chloride  flush  3 mL Intravenous Q12H   thiamine   100 mg Per Tube Daily   Continuous Infusions:  azithromycin  500 mg (03/21/24 0930)   cefTRIAXone  (ROCEPHIN )  IV 2 g (03/21/24 0934)   feeding supplement (OSMOLITE 1.5 CAL) 35 mL/hr at 03/21/24 0501   PRN Meds:.acetaminophen  **OR** acetaminophen , chlorpheniramine-HYDROcodone , metoprolol  tartrate  DVT  Prophylaxis  Place and maintain sequential compression device Start: 03/19/24 0914 Place and maintain sequential compression device Start: 03/19/24 0356   Recent Labs  Lab 03/18/24 1320 03/19/24 0326 03/19/24 1413 03/19/24 1919 03/21/24 0336  WBC 14.7* 7.6  --  6.6 6.5  HGB 9.6* 7.7* 7.7* 8.0* 7.8*  HCT 28.6* 24.3* 23.6* 24.3* 23.3*  PLT 363 292  --  330 335  MCV 88.8 92.4  --  91.4 90.3  MCH 29.8 29.3  --  30.1 30.2  MCHC 33.6 31.7  --  32.9 33.5  RDW 12.9 13.0  --  13.2 13.6  LYMPHSABS 0.3*  --   --   --  0.4*  MONOABS 0.6  --   --   --  0.8  EOSABS 0.0  --   --   --  0.1  BASOSABS 0.0  --   --   --  0.0    Recent Labs  Lab 03/18/24 1320 03/18/24 1351 03/18/24 1415 03/18/24 1528 03/18/24 1928 03/19/24 0415 03/19/24 0730 03/19/24 1413 03/19/24 1919 03/19/24 2144 03/20/24 0736 03/20/24 0959 03/21/24 0336  NA 117*  --   --  120*   < >  --  125* 125* 126* 127*  --   --  134*  K 5.4*  --   --  5.3*   < >  --  4.9 4.7 4.5 4.7  --   --  4.5  CL 89*  --   --  93*   < >  --  99 98 100 99  --   --  106  CO2 13*  --   --  12*   < >  --  14* 14* 11* 10*  --   --  13*  ANIONGAP 15  --   --  15   < >  --  13 13 15  18*  --   --  15  GLUCOSE 213*  --   --   209*   < >  --  154* 127* 112* 151*  --   --  189*  BUN 72*  --   --  71*   < >  --  74* 72* 74* 75*  --   --  76*  CREATININE 2.81*  --   --  2.69*   < >  --  2.57* 2.53* 2.47* 2.59*  --   --  2.38*  AST 29  --   --   --   --   --   --   --   --   --   --   --  21  ALT 11  --   --   --   --   --   --   --   --   --   --   --  16  ALKPHOS 104  --   --   --   --   --   --   --   --   --   --   --  104  BILITOT 0.5  --   --   --   --   --   --   --   --   --   --   --  <0.2  ALBUMIN  3.7  --   --   --   --   --   --   --   --   --   --   --  3.0*  CRP  --   --   --   --   --   --   --   --   --   --  21.7*  --  16.2*  PROCALCITON  --   --   --  2.16  --   --   --   --   --  1.64  --   --  0.99  LATICACIDVEN  --  2.0*  --   --   --   --   --  1.3 1.5  --   --   --   --   INR  --   --  1.2  --   --  1.2  --   --   --   --   --   --   --   MG  --   --   --   --   --   --   --   --   --  2.1  --   --  2.3  PHOS  --   --   --   --   --   --   --   --   --   --   --  4.6 4.2  CALCIUM  9.0  --   --  8.2*   < >  --  8.2* 8.0* 8.1* 8.1*  --   --  8.2*   < > = values in this interval not displayed.      Recent Labs  Lab 03/18/24 1351 03/18/24 1415 03/18/24 1528 03/18/24 1928 03/19/24 0415 03/19/24 0730 03/19/24 1413 03/19/24 1919 03/19/24 2144 03/20/24 0736 03/21/24 0336  CRP  --   --   --   --   --   --   --   --   --  21.7* 16.2*  PROCALCITON  --   --  2.16  --   --   --   --   --  1.64  --  0.99  LATICACIDVEN 2.0*  --   --   --   --   --  1.3 1.5  --   --   --   INR  --  1.2  --   --  1.2  --   --   --   --   --   --   MG  --   --   --   --   --   --   --   --  2.1  --  2.3  CALCIUM   --   --  8.2*   < >  --  8.2* 8.0* 8.1* 8.1*  --  8.2*   < > = values in this interval not displayed.   Lab Results  Component Value Date   CHOL 114 12/19/2020   HDL 36 (L) 12/19/2020   LDLCALC 58 12/19/2020   LDLDIRECT 137.0 07/11/2014   TRIG 99 12/19/2020   CHOLHDL 3.2 12/19/2020    Lab Results   Component Value Date   HGBA1C 6.5 (H) 12/19/2020   No results for input(s): TSH, T4TOTAL, FREET4, T3FREE, THYROIDAB in the last 72 hours. Recent Labs    03/19/24 0415  FERRITIN 792*  TIBC 182*  IRON 26*   ------------------------------------------------------------------------------------------------------------------ Cardiac Enzymes No results for input(s): CKMB, TROPONINI, MYOGLOBIN in the last 168 hours.  Invalid input(s): CK  Micro Results Recent Results (from the past 240 hours)  Resp panel by RT-PCR (RSV, Flu A&B, Covid)  Anterior Nasal Swab     Status: None   Collection Time: 03/18/24  1:20 PM   Specimen: Anterior Nasal Swab  Result Value Ref Range Status   SARS Coronavirus 2 by RT PCR NEGATIVE NEGATIVE Final   Influenza A by PCR NEGATIVE NEGATIVE Final   Influenza B by PCR NEGATIVE NEGATIVE Final    Comment: (NOTE) The Xpert Xpress SARS-CoV-2/FLU/RSV plus assay is intended as an aid in the diagnosis of influenza from Nasopharyngeal swab specimens and should not be used as a sole basis for treatment. Nasal washings and aspirates are unacceptable for Xpert Xpress SARS-CoV-2/FLU/RSV testing.  Fact Sheet for Patients: bloggercourse.com  Fact Sheet for Healthcare Providers: seriousbroker.it  This test is not yet approved or cleared by the [place]  FDA and has been authorized for detection and/or diagnosis of SARS-CoV-2 by FDA under an Emergency Use Authorization (EUA). This EUA will remain in effect (meaning this test can be used) for the duration of the COVID-19 declaration under Section 564(b)(1) of the Act, 21 U.S.C. section 360bbb-3(b)(1), unless the authorization is terminated or revoked.     Resp Syncytial Virus by PCR NEGATIVE NEGATIVE Final    Comment: (NOTE) Fact Sheet for Patients: bloggercourse.com  Fact Sheet for Healthcare  Providers: seriousbroker.it  This test is not yet approved or cleared by the [place]  FDA and has been authorized for detection and/or diagnosis of SARS-CoV-2 by FDA under an Emergency Use Authorization (EUA). This EUA will remain in effect (meaning this test can be used) for the duration of the COVID-19 declaration under Section 564(b)(1) of the Act, 21 U.S.C. section 360bbb-3(b)(1), unless the authorization is terminated or revoked.  Performed at [facility], [address] [place], [place] [address]   Blood Culture (routine x 2)     Status: None (Preliminary result)   Collection Time: 03/18/24  2:15 PM   Specimen: BLOOD  Result Value Ref Range Status   Specimen Description BLOOD LEFT ANTECUBITAL  Final   Special Requests   Final    BOTTLES DRAWN AEROBIC AND ANAEROBIC Blood Culture adequate volume   Culture   Final    NO GROWTH 3 DAYS Performed at [facility], [address], [place], [place] [address]    Report Status PENDING  Incomplete  Blood Culture (routine x 2)     Status: None (Preliminary result)   Collection Time: 03/19/24  2:13 PM   Specimen: BLOOD  Result Value Ref Range Status   Specimen Description BLOOD RIGHT ANTECUBITAL  Final   Special Requests   Final    BOTTLES DRAWN AEROBIC AND ANAEROBIC Blood Culture adequate volume   Culture   Final    NO GROWTH 2 DAYS Performed at [facility], [address] [place], [place] [address]    Report Status PENDING  Incomplete  MRSA Next Gen by PCR, Nasal     Status: None   Collection Time: 03/19/24  6:01 PM   Specimen: Nasal Mucosa; Nasal Swab  Result Value Ref Range Status   MRSA by PCR Next Gen NOT DETECTED NOT DETECTED Final    Comment: (NOTE) The GeneXpert MRSA Assay (FDA approved for NASAL specimens only), is one component of a comprehensive MRSA colonization surveillance program. It is not intended to diagnose MRSA infection nor to guide or monitor treatment  for MRSA infections. Test performance is not FDA approved in patients less than 16 years old. Performed at [facility], [address] [place],  [place] [address]     Radiology Reports  IR GASTROSTOMY TUBE REMOVAL/REPAIR Result Date: 03/21/2024 INDICATION: 89 year old male who is G-tube dependent presents with broken retention balloon. EXAM: EXCHANGE OF GASTROSTOMY TUBE MEDICATIONS: NONE COMPLICATIONS: None immediate. PROCEDURE: Informed consent was obtained from the patient after a thorough discussion of the procedural risks, benefits and alternatives. All questions were addressed. The existing 20 French balloon retention G-tube was removed and exchanged for a new 20-French balloon inflatable gastrostomy tube. The balloon was inflated with 8 mL saline and disc was cinched. The patient tolerated the procedure well without immediate postprocedural complication. Postprocedural G-tube location abdominal x-ray confirms appropriate location of the new G-tube. IMPRESSION: Successful replacement of a new 20-French gastrostomy tube. Performed by: Aimee Han, PA-C. Electronically Signed   By: CHRISTELLA.  Shick M.D.   On: 03/21/2024 09:24   DG ABDOMEN PEG TUBE LOCATION Result Date: 03/20/2024 CLINICAL DATA:  Evaluate PEG tube location. EXAM: ABDOMEN - 1 VIEW COMPARISON:  None Available. FINDINGS: The bowel gas pattern is normal. A percutaneous gastrostomy tube is seen with its distal tip overlying the body of the stomach. Radiopaque contrast is seen within the gastric fundus, gastric antrum and proximal duodenum. No contrast extravasation is seen. Radiopaque surgical clips are noted within the right upper quadrant. No radio-opaque calculi or other significant radiographic abnormality are seen. IMPRESSION: Percutaneous gastrostomy tube positioning, as described above. Electronically Signed   By: Suzen Dials M.D.   On: 03/20/2024 14:46   US  RENAL Result Date: 03/20/2024 EXAM: US  Retroperitoneum Complete,  Renal. 03/20/2024 08:25:00 AM TECHNIQUE: Real-time ultrasonography of the retroperitoneum renal was performed. COMPARISON: US  Renal 03/06/2022. CLINICAL HISTORY: AKI (acute kidney injury). FINDINGS: FINDINGS: RIGHT KIDNEY/URETER: Right kidney measures 8.9 x 3.8 x 4.9 cm with a volume of 85.7 cc. Normal cortical echogenicity. No hydronephrosis. No calculus. No mass. Right ureteral jet is visualized. LEFT KIDNEY/URETER: Left kidney measures 8.9 x 5.3 x 4.4 cm with a volume of 109.8 cc. Normal cortical echogenicity. No hydronephrosis. No calculus. No mass. Left ureteral jet is visualized. Incidental note of upper pole left kidney cyst measuring 8 mm. BLADDER: Unremarkable appearance of the bladder. OTHER FINDINGS: Incidental right pleural effusion, moderate. IMPRESSION: 1. No acute findings. Electronically signed by: Waddell Calk MD 03/20/2024 08:58 AM EST RP Workstation: [id number]   DG Chest Port 1 View Result Date: 03/20/2024 EXAM: 1 VIEW(S) XRAY OF THE CHEST 03/20/2024 07:20:00 AM COMPARISON: 03/19/2024 CLINICAL HISTORY: SOB (shortness of breath) FINDINGS: LUNGS AND PLEURA: Patchy airspace opacities in left mid and lower lung, slightly worsened. Stable patchy airspace opacities in right mid and lower lung. Small left pleural effusion. No pneumothorax. HEART AND MEDIASTINUM: Aortic atherosclerosis. No acute abnormality of the cardiac and mediastinal silhouettes. BONES AND SOFT TISSUES: No acute osseous abnormality. IMPRESSION: 1. Patchy airspace opacities in left mid and lower lung, slightly worsened. 2. Stable patchy airspace opacities in right mid and lower lung. 3. Small left pleural effusion. 4. Aortic atherosclerosis. Electronically signed by: Evalene Coho MD 03/20/2024 07:42 AM EST RP Workstation: [id number]   DG Chest Port 1 View Result Date: 03/19/2024 CLINICAL DATA:  Short of breath chest pain EXAM: PORTABLE CHEST 1 VIEW COMPARISON:  03/18/2024, 02/11/2024 FINDINGS: Stable cardiomediastinal  silhouette with aortic atherosclerosis. Slight interval worsening of patchy bilateral airspace opacities. No pneumothorax. Probable trace left effusion IMPRESSION: Slight interval worsening of patchy bilateral airspace opacities, most likely multifocal pneumonia. Electronically Signed   By: Luke Bun M.D.   On: 03/19/2024 21:28      Signature  -  Lavada Stank M.D on 03/21/2024 at 10:33 AM   -  To page go to [email or web address]        "

## 2024-03-21 NOTE — Progress Notes (Signed)
 Speech Language Pathology Treatment: Dysphagia  Patient Details Name: John Walton MRN: [id number] DOB: 1933-02-09 Today's Date: 03/21/2024 Time: 1208-1228 SLP Time Calculation (min) (ACUTE ONLY): 20 min  Assessment / Plan / Recommendation Clinical Impression  Pt recalls previous swallow studies and states he uses caution when eating/drinking. He repeatedly states he wishes to remain NPO until he returns home but would consider repeated instrumental testing if warranted. Most recent MBS 01/12/24 reports trace aspiration that is sensed as thin liquids reach his vocal folds and are expelled with throat clearance. With both thin and nectar thick liquids today, throat clearance is initially noted but is followed by prolonged, explosive coughing. This seems to be a change from his baseline dysphagia and temporary diet modifications may need to be considered in light of acute pneumonia, though thickened liquids did not offer clinical benefit.   Pt does not seem to be interested in resuming POs acutely, understanding increased risk secondary to deconditioning. Will f/u to further discuss pt's goals with consideration of repeating an MBS given increased coughing with both thin and thickened liquids, which seems to be a change from his baseline, even considering chronic dysphagia. Ice chips or tspn sips of water  can still be offered in moderation, which are meant to promote pharyngeal hygiene, provide comfort, and preserve some swallowing function. Provide thorough oral care before ice chips and encourage coughing after. Otherwise, give meds/nutrition via G-tube.    HPI HPI: 89 yo male presenting to ED 12/18 with weakness and cough. Admitted with multifocal pneumonia and metabolic abnormalities. CXR shows patchy airspace opacities in L mid and lower lung stable patchy airspace opacities in the R mid and lower lung. Multiple prior MBS (most recent 01/12/24) with history of chronic pharyngeal dysphagia due to  DISH with occasional aspiration but no previous episodes of pneumonia. S/p G-tube for hydration but continues to eat a regular diet. PMH includes HOH, HTN, HLD, GERD, DM, CKD 4, prior CVA, aortic stenosis, hypothyroidism, glaucoma, BPH s/p TURP, anemia, dysphagia s/p G-tube placement      SLP Plan  Continue with current plan of care        Swallow Evaluation Recommendations   Recommendations: NPO;Ice chips PRN after oral care;Alternative means of nutrition - G Tube Oral care recommendations: Oral care QID (4x/day);Oral care before ice chips/water      Recommendations                     Oral care QID;Oral care prior to ice chip/H20   Frequent or constant Supervision/Assistance Dysphagia, unspecified (R13.10)     Continue with current plan of care     Damien Blumenthal, M.A., CCC-SLP Speech Language Pathology, Acute Rehabilitation Services  Secure Chat preferred [phone]   03/21/2024, 2:01 PM

## 2024-03-22 ENCOUNTER — Inpatient Hospital Stay (HOSPITAL_COMMUNITY)

## 2024-03-22 DIAGNOSIS — E871 Hypo-osmolality and hyponatremia: Secondary | ICD-10-CM | POA: Diagnosis not present

## 2024-03-22 LAB — CBC WITH DIFFERENTIAL/PLATELET
Basophils Absolute: 0 K/uL (ref 0.0–0.1)
Basophils Relative: 0 %
Eosinophils Absolute: 0.4 K/uL (ref 0.0–0.5)
Eosinophils Relative: 4 %
HCT: 24.4 % — ABNORMAL LOW (ref 39.0–52.0)
Hemoglobin: 7.8 g/dL — ABNORMAL LOW (ref 13.0–17.0)
Lymphocytes Relative: 6 %
Lymphs Abs: 0.6 K/uL — ABNORMAL LOW (ref 0.7–4.0)
MCH: 28.9 pg (ref 26.0–34.0)
MCHC: 32 g/dL (ref 30.0–36.0)
MCV: 90.4 fL (ref 80.0–100.0)
Monocytes Absolute: 0.4 K/uL (ref 0.1–1.0)
Monocytes Relative: 4 %
Neutro Abs: 8.5 K/uL — ABNORMAL HIGH (ref 1.7–7.7)
Neutrophils Relative %: 86 %
Platelets: 365 K/uL (ref 150–400)
RBC: 2.7 MIL/uL — ABNORMAL LOW (ref 4.22–5.81)
RDW: 13.9 % (ref 11.5–15.5)
WBC: 9.9 K/uL (ref 4.0–10.5)
nRBC: 0.2 % (ref 0.0–0.2)

## 2024-03-22 LAB — COMPREHENSIVE METABOLIC PANEL WITH GFR
ALT: 16 U/L (ref 0–44)
AST: 17 U/L (ref 15–41)
Albumin: 2.9 g/dL — ABNORMAL LOW (ref 3.5–5.0)
Alkaline Phosphatase: 105 U/L (ref 38–126)
Anion gap: 13 (ref 5–15)
BUN: 78 mg/dL — ABNORMAL HIGH (ref 8–23)
CO2: 15 mmol/L — ABNORMAL LOW (ref 22–32)
Calcium: 8.6 mg/dL — ABNORMAL LOW (ref 8.9–10.3)
Chloride: 106 mmol/L (ref 98–111)
Creatinine, Ser: 2.48 mg/dL — ABNORMAL HIGH (ref 0.61–1.24)
GFR, Estimated: 24 mL/min — ABNORMAL LOW
Glucose, Bld: 381 mg/dL — ABNORMAL HIGH (ref 70–99)
Potassium: 4.7 mmol/L (ref 3.5–5.1)
Sodium: 134 mmol/L — ABNORMAL LOW (ref 135–145)
Total Bilirubin: <0.2 mg/dL (ref 0.0–1.2)
Total Protein: 6.3 g/dL — ABNORMAL LOW (ref 6.5–8.1)

## 2024-03-22 LAB — C-REACTIVE PROTEIN: CRP: 12.2 mg/dL — ABNORMAL HIGH

## 2024-03-22 LAB — GLUCOSE, CAPILLARY
Glucose-Capillary: 142 mg/dL — ABNORMAL HIGH (ref 70–99)
Glucose-Capillary: 143 mg/dL — ABNORMAL HIGH (ref 70–99)
Glucose-Capillary: 160 mg/dL — ABNORMAL HIGH (ref 70–99)
Glucose-Capillary: 277 mg/dL — ABNORMAL HIGH (ref 70–99)
Glucose-Capillary: 361 mg/dL — ABNORMAL HIGH (ref 70–99)
Glucose-Capillary: 367 mg/dL — ABNORMAL HIGH (ref 70–99)
Glucose-Capillary: 385 mg/dL — ABNORMAL HIGH (ref 70–99)

## 2024-03-22 LAB — PROCALCITONIN: Procalcitonin: 0.68 ng/mL

## 2024-03-22 LAB — HEMOGLOBIN A1C
Hgb A1c MFr Bld: 7.9 % — ABNORMAL HIGH (ref 4.8–5.6)
Mean Plasma Glucose: 180.03 mg/dL

## 2024-03-22 LAB — MAGNESIUM: Magnesium: 2.5 mg/dL — ABNORMAL HIGH (ref 1.7–2.4)

## 2024-03-22 LAB — PHOSPHORUS: Phosphorus: 3.6 mg/dL (ref 2.5–4.6)

## 2024-03-22 MED ORDER — ONDANSETRON HCL 4 MG/2ML IJ SOLN: 4.0000 mg | Freq: Four times a day (QID) | INTRAMUSCULAR | Status: DC | PRN

## 2024-03-22 MED ORDER — FREE WATER
150.0000 mL | Freq: Four times a day (QID) | Status: DC
  Administered 2024-03-23 (×4): 150 mL

## 2024-03-22 MED ORDER — INSULIN GLARGINE 100 UNIT/ML ~~LOC~~ SOLN
10.0000 [IU] | Freq: Every day | SUBCUTANEOUS | Status: DC
  Administered 2024-03-22: 10 [IU] via SUBCUTANEOUS
  Filled 2024-03-22 (×2): qty 0.1

## 2024-03-22 MED ORDER — VITAMIN B-12 1000 MCG PO TABS
500.0000 ug | ORAL_TABLET | Freq: Two times a day (BID) | ORAL | Status: DC
  Administered 2024-03-22 – 2024-03-23 (×3): 500 ug
  Filled 2024-03-22 (×3): qty 1

## 2024-03-22 MED ORDER — MORPHINE SULFATE (PF) 2 MG/ML IV SOLN
1.0000 mg | INTRAVENOUS | Status: DC | PRN
  Administered 2024-03-22: 1 mg via INTRAVENOUS
  Filled 2024-03-22: qty 1

## 2024-03-22 MED ORDER — GUAIFENESIN-CODEINE 100-10 MG/5ML PO SOLN: 10.0000 mL | Freq: Once | ORAL | Status: DC

## 2024-03-22 MED ORDER — GLUCERNA 1.5 CAL PO LIQD
1000.0000 mL | ORAL | Status: DC
  Administered 2024-03-22: 1000 mL
  Filled 2024-03-22 (×2): qty 1000

## 2024-03-22 MED ORDER — ALBUMIN HUMAN 25 % IV SOLN
25.0000 g | Freq: Once | INTRAVENOUS | Status: AC
  Administered 2024-03-22: 25 g via INTRAVENOUS
  Filled 2024-03-22: qty 100

## 2024-03-22 MED ORDER — IPRATROPIUM-ALBUTEROL 0.5-2.5 (3) MG/3ML IN SOLN
3.0000 mL | Freq: Four times a day (QID) | RESPIRATORY_TRACT | Status: DC | PRN
  Administered 2024-03-22: 3 mL via RESPIRATORY_TRACT
  Filled 2024-03-22: qty 3

## 2024-03-22 NOTE — [place] FL2 (Deleted)
 " [place]  MEDICAID FL2 LEVEL OF CARE FORM     IDENTIFICATION  Patient Name: John Walton Birthdate: 17-Sep-1932 Sex: male Admission Date (Current Location): 03/18/2024  [facility] and [place] Number:  Producer, Television/film/video and Address:  [facility], [address], [place], [place] [address]      Provider Number: [id number]  Attending Physician Name and Address:  Dennise Lavada POUR, MD  Relative Name and Phone Number:       Current Level of Care: Hospital Recommended Level of Care: Skilled Nursing Facility Prior Approval Number:    Date Approved/Denied:   PASRR Number: [id number]  Discharge Plan: SNF    Current Diagnoses: Patient Active Problem List   Diagnosis Date Noted   History of CVA (cerebrovascular accident) 03/18/2024   Glaucoma 03/18/2024   CAP (community acquired pneumonia) 03/18/2024   Hyponatremia 03/18/2024   Vascular dementia (HCC) 03/18/2024   Acute respiratory failure with hypoxia (HCC) 03/18/2024   Aspiration pneumonia (HCC) 01/20/2024   Malnutrition of moderate degree 01/20/2024   Syncope and collapse 01/18/2024   Acute renal failure superimposed on stage 4 chronic kidney disease (HCC) 06/18/2023   Central retinal artery occlusion, left eye 07/29/2022   Gastro-esophageal reflux disease without esophagitis 07/29/2022   Nonrheumatic aortic (valve) stenosis 07/29/2022   S/P TURP (status post transurethral resection of prostate) 12/19/2020   ABLA (acute blood loss anemia) 12/19/2020   Symptomatic stenosis of right carotid artery 12/19/2020   BPH with obstruction/lower urinary tract symptoms 12/14/2020   Hypocalcemia    Protein-calorie malnutrition, severe 03/29/2020   Hypokalemia 03/29/2020   Hypernatremia 03/29/2020   DM (diabetes mellitus), type 2 (HCC) 03/29/2020   Fever 03/29/2020   Weakness 03/29/2020   Dysphagia 03/26/2020   Bilateral hydronephrosis 03/08/2020   Unilateral primary osteoarthritis, left hip     Femoral neck fracture (HCC) 03/07/2020   CKD (chronic kidney disease), stage IV (HCC) 03/07/2020   Normocytic anemia 03/07/2020   BPH (benign prostatic hyperplasia) 03/07/2020   Abdominal mass 03/07/2020   Scalp hematoma 03/07/2020   Hypertensive urgency 03/07/2020   Pain due to onychomycosis of toenails of both feet 08/13/2019   RBBB 09/02/2014   Medicare annual wellness visit, subsequent 07/11/2014   Essential hypertension 01/11/2014   Hyperlipidemia 01/11/2014    Orientation RESPIRATION BLADDER Height & Weight     Self, Place  Normal Incontinent Weight: 160 lb 11.5 oz (72.9 kg) Height:  5' 11 (180.3 cm) (Pt unsure  but best guess)  BEHAVIORAL SYMPTOMS/MOOD NEUROLOGICAL BOWEL NUTRITION STATUS      Incontinent Diet (See dc summary)  AMBULATORY STATUS COMMUNICATION OF NEEDS Skin   Extensive Assist Verbally Normal                       Personal Care Assistance Level of Assistance  Bathing, Feeding, Dressing Bathing Assistance: Maximum assistance Feeding assistance: Limited assistance Dressing Assistance: Maximum assistance     Functional Limitations Info  Sight, Hearing Sight Info: Impaired Hearing Info: Impaired      SPECIAL CARE FACTORS FREQUENCY  PT (By licensed PT), OT (By licensed OT)     PT Frequency: 5x/week OT Frequency: 5x/week            Contractures Contractures Info: Not present    Additional Factors Info  Code Status, Allergies Code Status Info: DNR-limited Allergies Info: Other, Influenza Vaccines, Robitussin Cold Cough+ Chest (Dextromethorphan -guaifenesin )           Current Medications (03/22/2024):  This is the current hospital active medication list Current Facility-Administered Medications  Medication Dose Route Frequency Provider Last Rate Last Admin   acetaminophen  (TYLENOL ) tablet 650 mg  650 mg Per Tube Q6H PRN Seena Marsa NOVAK, MD       Or   acetaminophen  (TYLENOL ) suppository 650 mg  650 mg Rectal Q6H PRN Seena Marsa NOVAK, MD       artificial tears (LACRILUBE) ophthalmic ointment   Both Eyes BID Seena Marsa NOVAK, MD   Given at 03/22/24 9180   aspirin  EC tablet 81 mg  81 mg Oral Daily Melvin, Alexander B, MD   81 mg at 03/22/24 0818   atorvastatin  (LIPITOR) tablet 40 mg  40 mg Oral QHS Melvin, Alexander B, MD   40 mg at 03/21/24 2139   azithromycin  (ZITHROMAX ) 500 mg in sodium chloride  0.9 % 250 mL IVPB  500 mg Intravenous Q24H Seena Marsa NOVAK, MD 250 mL/hr at 03/22/24 0829 500 mg at 03/22/24 9170   bacitracin  ointment   Topical TID Singh, Prashant K, MD   [phone] Application at 03/22/24 [phone]   brinzolamide  (AZOPT ) 1 % ophthalmic suspension 1 drop  1 drop Both Eyes BID Seena Marsa NOVAK, MD   1 drop at 03/22/24 9180   And   brimonidine  (ALPHAGAN ) 0.2 % ophthalmic solution 1 drop  1 drop Both Eyes BID Melvin, Alexander B, MD   1 drop at 03/22/24 9180   cefTRIAXone  (ROCEPHIN ) 2 g in sodium chloride  0.9 % 100 mL IVPB  2 g Intravenous Q24H Seena Marsa NOVAK, MD 200 mL/hr at 03/22/24 0827 2 g at 03/22/24 0827   chlorpheniramine-HYDROcodone  (TUSSIONEX) 10-8 MG/5ML suspension 5 mL  5 mL Per Tube Q12H PRN Howerter, Justin B, DO   5 mL at 03/21/24 0431   cyanocobalamin  (VITAMIN B12) tablet 500 mcg  500 mcg Per Tube BID Singh, Prashant K, MD   500 mcg at 03/22/24 1208   feeding supplement (OSMOLITE 1.5 CAL) liquid 1,000 mL  1,000 mL Per Tube Continuous Singh, Prashant K, MD 45 mL/hr at 03/22/24 0433 Infusion Verify at 03/22/24 0433   insulin  aspart (novoLOG ) injection 0-9 Units  0-9 Units Subcutaneous Q4H Melvin, Alexander B, MD   5 Units at 03/22/24 1208   insulin  glargine (LANTUS ) injection 10 Units  10 Units Subcutaneous Daily Singh, Prashant K, MD   10 Units at 03/22/24 1208   latanoprost  (XALATAN ) 0.005 % ophthalmic solution 1 drop  1 drop Both Eyes QHS Melvin, Alexander B, MD   1 drop at 03/21/24 2144   methimazole  (TAPAZOLE ) tablet 5 mg  5 mg Oral Daily Melvin, Alexander B, MD   5 mg at 03/22/24  9181   metoprolol  tartrate (LOPRESSOR ) injection 2.5 mg  2.5 mg Intravenous Q4H PRN Howerter, Justin B, DO       ondansetron  (ZOFRAN ) injection 4 mg  4 mg Intravenous Q6H PRN Singh, Prashant K, MD       prednisoLONE  acetate (PRED FORTE ) 1 % ophthalmic suspension 1 drop  1 drop Left Eye Daily Seena Marsa NOVAK, MD   1 drop at 03/22/24 9180   sodium chloride  flush (NS) 0.9 % injection 3 mL  3 mL Intravenous Q12H Seena Marsa NOVAK, MD   3 mL at 03/22/24 9178   thiamine  (VITAMIN B1) tablet 100 mg  100 mg Per Tube Daily Singh, Prashant K, MD   100 mg at 03/22/24 9181     Discharge Medications: Please see discharge summary for a list of discharge medications.  Relevant Imaging Results:  Relevant Lab Results:   Additional Information ssn:[id number]  Inocente GORMAN Kindle, LCSW     "

## 2024-03-22 NOTE — Progress Notes (Signed)
 " PROGRESS NOTE    John Walton  FMW:[id number] DOB: 1932-10-01 DOA: 03/18/2024 PCP: Waymond Ryan Askew, MD  Chief Complaint  Patient presents with   Weakness    Brief Narrative:   John Walton is a 89 y.o. male with medical history significant of hypertension, hyperlipidemia, GERD, diabetes, CKD 4, carotid artery disease, CVA, aortic stenosis, hypothyroidism, glaucoma, BPH status post TURP, anemia, status post G-tube presenting with weakness and cough.   Admitted for multifocal pneumonia and metabolic abnormalities.    Assessment & Plan:    Hyponatremia Hyperkalemia Elevated gap metabolic acidosis AKI on CKD 4  Due to dehydration improving with IV fluids, patient has history of consuming less fluids and not drinking enough water , dependent on free water  flushes.  Renal ultrasound stable, continue to monitor electrolytes and renal function.  Pneumonia Acute respiratory failure with hypoxia CXR with multifocal pneumonia, trace R effusion Negative covid, flu, RSV Urine strep, urine legionella, sputum culture Continue ceftriaxone , azithromycin  Encouraged to sit in chair use I-S and flutter valve for pulmonary toiletry, advance activity and titrate down oxygen.  Chronic anemia Downtrending in setting of receiving IVF Will trend  Status post G-tube History of dysphagia Allergy to cough medications > Patient is status post G-tube years ago per family; when he had an allergic reaction to cough medicine causing narrowing of his airway/esophagus.  Was on this for some time and then had improvement was able to swallow better. > Has had intermittent issues with dysphagia and has had aspiration pneumonia in the past and so has maintained G-tube in place to use for supplemental nutrition or as needed. > Swallow study was performed in October of this year and recommendation was for p.o. diet, regular.  Thin liquids.  Medication to be administered by alternative means. - Have speech  therapy see him to advance his oral diet as appropriate, there is some redness around the PEG tube site for which IR is consulted, tube actually had malfunctioned with the balloon likely punctured, tube was exchanged on 03/20/2024, now functioning well, overall stable continue Topical cream bacitracin  around the tube site for some local irritation.   Troponin elevation Appreciate cards assistance, thought due demand in the setting of pneumonia, metabolic derangement, lactic acidosis, AKI - no further cardiac   Vascular dementia Hard of Hearing (speak loudly into his L ear) > Family reports patient had a gradual decline in his mental status especially over the past 6 months to a year.   Delirium precautions   Severe Aortic stenosis Mitral stenosis HFmrEF  Low normal RVSF > Echo in October showed moderate mitral stenosis and severe aortic stenosis with EF 40-45%. Caution with IVF, currently getting IVF with suspected hypovolemic hyponatremia   Hyperthyroidism methimazole    Hyperlipidemia - Continue home atorvastatin    Carotid artery disease History of CVA - Continue home ASA, atorvastatin    Glaucoma  - Continue eyedrops  Diabetes - Sugars in poor control on SSI, add Lantus , dietitian requested to change tube feeds.  Check A1c.   CBG (last 3)  Recent Labs    03/22/24 0005 03/22/24 0526 03/22/24 0751  GLUCAP 361* 367* 385*   Lab Results  Component Value Date   HGBA1C 6.5 (H) 12/19/2020       DVT prophylaxis: SCD Code Status: DNR Family Communication: Updated wife [phone]  in detail on 03/20/2024, 03/21/24 Disposition:   Status is: Inpatient Remains inpatient appropriate because: need for continued inpatient care   Consultants:  IR  Procedures:  none  Subjective:  Patient in bed, appears comfortable, denies any headache, no fever, no chest pain or pressure, no shortness of breath , no abdominal pain. No new focal weakness.   Objective: Vitals:    03/22/24 0446 03/22/24 0500 03/22/24 0750 03/22/24 0800  BP: 115/67  117/62 115/62  Pulse:   98 99  Resp: 17  17 (!) 28  Temp: 98 F (36.7 C)  (!) 97.4 F (36.3 C)   TempSrc: Oral  Oral   SpO2: 91%  91% 90%  Weight:  72.9 kg    Height:        Intake/Output Summary (Last 24 hours) at 03/22/2024 0856 Last data filed at 03/22/2024 0433 Gross per 24 hour  Intake 1785.09 ml  Output --  Net 1785.09 ml    Filed Weights   03/21/24 0500 03/22/24 0500  Weight: 101.2 kg 72.9 kg    Examination:  Awake Alert, No new F.N deficits, Normal affect, ++ hearing loss, PEG in place some redness and discharge around the site [place].AT,PERRAL Supple Neck, No JVD,   Symmetrical Chest wall movement, Good air movement bilaterally, CTAB RRR,No Gallops, Rubs or new Murmurs,  +ve B.Sounds, Abd Soft, No tenderness,   No Cyanosis, Clubbing or edema      Data Review:   Patient Lines/Drains/Airways Status     Active Line/Drains/Airways     Name Placement date Placement time Site Days   Peripheral IV 03/18/24 20 G Left Antecubital 03/18/24  1327  Antecubital  4   Peripheral IV 03/18/24 22 G 1.75 Anterior;Distal;Left Forearm 03/18/24  1516  Forearm  4   Gastrostomy/Enterostomy Gastrostomy 20 Fr. LUQ 03/29/20  1223  LUQ  1454   External Urinary Catheter 03/19/24  0109  --  3           Inpatient Medications  Scheduled Meds:  artificial tears   Both Eyes BID   aspirin  EC  81 mg Oral Daily   atorvastatin   40 mg Oral QHS   bacitracin    Topical TID   brinzolamide   1 drop Both Eyes BID   And   brimonidine   1 drop Both Eyes BID   cyanocobalamin   500 mcg Per Tube BID   insulin  aspart  0-9 Units Subcutaneous Q4H   insulin  glargine  10 Units Subcutaneous Daily   latanoprost   1 drop Both Eyes QHS   methimazole   5 mg Oral Daily   prednisoLONE  acetate  1 drop Left Eye Daily   sodium chloride  flush  3 mL Intravenous Q12H   thiamine   100 mg Per Tube Daily   Continuous Infusions:  azithromycin   500 mg (03/22/24 0829)   cefTRIAXone  (ROCEPHIN )  IV 2 g (03/22/24 0827)   feeding supplement (OSMOLITE 1.5 CAL) 45 mL/hr at 03/22/24 0433   PRN Meds:.acetaminophen  **OR** acetaminophen , chlorpheniramine-HYDROcodone , metoprolol  tartrate  DVT Prophylaxis  Place and maintain sequential compression device Start: 03/19/24 0914 Place and maintain sequential compression device Start: 03/19/24 0356   Recent Labs  Lab 03/18/24 1320 03/19/24 0326 03/19/24 1413 03/19/24 1919 03/21/24 0336 03/22/24 0414  WBC 14.7* 7.6  --  6.6 6.5 9.9  HGB 9.6* 7.7* 7.7* 8.0* 7.8* 7.8*  HCT 28.6* 24.3* 23.6* 24.3* 23.3* 24.4*  PLT 363 292  --  330 335 365  MCV 88.8 92.4  --  91.4 90.3 90.4  MCH 29.8 29.3  --  30.1 30.2 28.9  MCHC 33.6 31.7  --  32.9 33.5 32.0  RDW 12.9 13.0  --  13.2 13.6 13.9  LYMPHSABS 0.3*  --   --   --  0.4* 0.6*  MONOABS 0.6  --   --   --  0.8 0.4  EOSABS 0.0  --   --   --  0.1 0.4  BASOSABS 0.0  --   --   --  0.0 0.0    Recent Labs  Lab 03/18/24 1320 03/18/24 1351 03/18/24 1415 03/18/24 1528 03/18/24 1928 03/19/24 0415 03/19/24 0730 03/19/24 1413 03/19/24 1919 03/19/24 2144 03/20/24 0736 03/20/24 0959 03/21/24 0336 03/22/24 0414  NA 117*  --   --  120*   < >  --    < > 125* 126* 127*  --   --  134* 134*  K 5.4*  --   --  5.3*   < >  --    < > 4.7 4.5 4.7  --   --  4.5 4.7  CL 89*  --   --  93*   < >  --    < > 98 100 99  --   --  106 106  CO2 13*  --   --  12*   < >  --    < > 14* 11* 10*  --   --  13* 15*  ANIONGAP 15  --   --  15   < >  --    < > 13 15 18*  --   --  15 13  GLUCOSE 213*  --   --  209*   < >  --    < > 127* 112* 151*  --   --  189* 381*  BUN 72*  --   --  71*   < >  --    < > 72* 74* 75*  --   --  76* 78*  CREATININE 2.81*  --   --  2.69*   < >  --    < > 2.53* 2.47* 2.59*  --   --  2.38* 2.48*  AST 29  --   --   --   --   --   --   --   --   --   --   --  21 17  ALT 11  --   --   --   --   --   --   --   --   --   --   --  16 16  ALKPHOS 104  --    --   --   --   --   --   --   --   --   --   --  104 105  BILITOT 0.5  --   --   --   --   --   --   --   --   --   --   --  <0.2 <0.2  ALBUMIN  3.7  --   --   --   --   --   --   --   --   --   --   --  3.0* 2.9*  CRP  --   --   --   --   --   --   --   --   --   --  21.7*  --  16.2* 12.2*  PROCALCITON  --   --   --  2.16  --   --   --   --   --  1.64  --   --  0.99 0.68  LATICACIDVEN  --  2.0*  --   --   --   --   --  1.3 1.5  --   --   --   --   --   INR  --   --  1.2  --   --  1.2  --   --   --   --   --   --   --   --   MG  --   --   --   --   --   --   --   --   --  2.1  --   --  2.3 2.5*  PHOS  --   --   --   --   --   --   --   --   --   --   --  4.6 4.2 3.6  CALCIUM  9.0  --   --  8.2*   < >  --    < > 8.0* 8.1* 8.1*  --   --  8.2* 8.6*   < > = values in this interval not displayed.      Recent Labs  Lab 03/18/24 1351 03/18/24 1415 03/18/24 1528 03/18/24 1928 03/19/24 0415 03/19/24 0730 03/19/24 1413 03/19/24 1919 03/19/24 2144 03/20/24 0736 03/21/24 0336 03/22/24 0414  CRP  --   --   --   --   --   --   --   --   --  21.7* 16.2* 12.2*  PROCALCITON  --   --  2.16  --   --   --   --   --  1.64  --  0.99 0.68  LATICACIDVEN 2.0*  --   --   --   --   --  1.3 1.5  --   --   --   --   INR  --  1.2  --   --  1.2  --   --   --   --   --   --   --   MG  --   --   --   --   --   --   --   --  2.1  --  2.3 2.5*  CALCIUM   --   --  8.2*   < >  --    < > 8.0* 8.1* 8.1*  --  8.2* 8.6*   < > = values in this interval not displayed.   Lab Results  Component Value Date   CHOL 114 12/19/2020   HDL 36 (L) 12/19/2020   LDLCALC 58 12/19/2020   LDLDIRECT 137.0 07/11/2014   TRIG 99 12/19/2020   CHOLHDL 3.2 12/19/2020    Lab Results  Component Value Date   HGBA1C 6.5 (H) 12/19/2020   No results for input(s): TSH, T4TOTAL, FREET4, T3FREE, THYROIDAB in the last 72 hours. No results for input(s): VITAMINB12, FOLATE, FERRITIN, TIBC, IRON, RETICCTPCT in the last 72  hours.  ------------------------------------------------------------------------------------------------------------------ Cardiac Enzymes No results for input(s): CKMB, TROPONINI, MYOGLOBIN in the last 168 hours.  Invalid input(s): CK  Micro Results Recent Results (from the past 240 hours)  Resp panel by RT-PCR (RSV, Flu A&B, Covid) Anterior Nasal Swab     Status: None   Collection Time: 03/18/24  1:20 PM   Specimen: Anterior Nasal Swab  Result Value Ref Range Status   SARS Coronavirus 2 by RT PCR  NEGATIVE NEGATIVE Final   Influenza A by PCR NEGATIVE NEGATIVE Final   Influenza B by PCR NEGATIVE NEGATIVE Final    Comment: (NOTE) The Xpert Xpress SARS-CoV-2/FLU/RSV plus assay is intended as an aid in the diagnosis of influenza from Nasopharyngeal swab specimens and should not be used as a sole basis for treatment. Nasal washings and aspirates are unacceptable for Xpert Xpress SARS-CoV-2/FLU/RSV testing.  Fact Sheet for Patients: bloggercourse.com  Fact Sheet for Healthcare Providers: seriousbroker.it  This test is not yet approved or cleared by the [place]  FDA and has been authorized for detection and/or diagnosis of SARS-CoV-2 by FDA under an Emergency Use Authorization (EUA). This EUA will remain in effect (meaning this test can be used) for the duration of the COVID-19 declaration under Section 564(b)(1) of the Act, 21 U.S.C. section 360bbb-3(b)(1), unless the authorization is terminated or revoked.     Resp Syncytial Virus by PCR NEGATIVE NEGATIVE Final    Comment: (NOTE) Fact Sheet for Patients: bloggercourse.com  Fact Sheet for Healthcare Providers: seriousbroker.it  This test is not yet approved or cleared by the [place]  FDA and has been authorized for detection and/or diagnosis of SARS-CoV-2 by FDA under an Emergency Use Authorization (EUA).  This EUA will remain in effect (meaning this test can be used) for the duration of the COVID-19 declaration under Section 564(b)(1) of the Act, 21 U.S.C. section 360bbb-3(b)(1), unless the authorization is terminated or revoked.  Performed at [facility], [address], [place], [place] [address]   Blood Culture (routine x 2)     Status: None (Preliminary result)   Collection Time: 03/18/24  2:15 PM   Specimen: BLOOD  Result Value Ref Range Status   Specimen Description BLOOD LEFT ANTECUBITAL  Final   Special Requests   Final    BOTTLES DRAWN AEROBIC AND ANAEROBIC Blood Culture adequate volume   Culture   Final    NO GROWTH 3 DAYS Performed at [facility], [address] [place], [place] [address]    Report Status PENDING  Incomplete  Blood Culture (routine x 2)     Status: None (Preliminary result)   Collection Time: 03/19/24  2:13 PM   Specimen: BLOOD  Result Value Ref Range Status   Specimen Description BLOOD RIGHT ANTECUBITAL  Final   Special Requests   Final    BOTTLES DRAWN AEROBIC AND ANAEROBIC Blood Culture adequate volume   Culture   Final    NO GROWTH 2 DAYS Performed at [facility], [address], [place], [place] [address]    Report Status PENDING  Incomplete  MRSA Next Gen by PCR, Nasal     Status: None   Collection Time: 03/19/24  6:01 PM   Specimen: Nasal Mucosa; Nasal Swab  Result Value Ref Range Status   MRSA by PCR Next Gen NOT DETECTED NOT DETECTED Final    Comment: (NOTE) The GeneXpert MRSA Assay (FDA approved for NASAL specimens only), is one component of a comprehensive MRSA colonization surveillance program. It is not intended to diagnose MRSA infection nor to guide or monitor treatment for MRSA infections. Test performance is not FDA approved in patients less than 66 years old. Performed at [facility], [address], [place], [place] [address]     Radiology Reports  IR GASTROSTOMY TUBE REMOVAL/REPAIR Result  Date: 03/21/2024 INDICATION: 89 year old male who is G-tube dependent presents with broken retention balloon. EXAM: EXCHANGE OF GASTROSTOMY TUBE MEDICATIONS: NONE COMPLICATIONS: None  immediate. PROCEDURE: Informed consent was obtained from the patient after a thorough discussion of the procedural risks, benefits and alternatives. All questions were addressed. The existing 20 French balloon retention G-tube was removed and exchanged for a new 20-French balloon inflatable gastrostomy tube. The balloon was inflated with 8 mL saline and disc was cinched. The patient tolerated the procedure well without immediate postprocedural complication. Postprocedural G-tube location abdominal x-ray confirms appropriate location of the new G-tube. IMPRESSION: Successful replacement of a new 20-French gastrostomy tube. Performed by: Aimee Han, PA-C. Electronically Signed   By: CHRISTELLA.  Shick M.D.   On: 03/21/2024 09:24   DG ABDOMEN PEG TUBE LOCATION Result Date: 03/20/2024 CLINICAL DATA:  Evaluate PEG tube location. EXAM: ABDOMEN - 1 VIEW COMPARISON:  None Available. FINDINGS: The bowel gas pattern is normal. A percutaneous gastrostomy tube is seen with its distal tip overlying the body of the stomach. Radiopaque contrast is seen within the gastric fundus, gastric antrum and proximal duodenum. No contrast extravasation is seen. Radiopaque surgical clips are noted within the right upper quadrant. No radio-opaque calculi or other significant radiographic abnormality are seen. IMPRESSION: Percutaneous gastrostomy tube positioning, as described above. Electronically Signed   By: Suzen Dials M.D.   On: 03/20/2024 14:46     Signature  -   Lavada Stank M.D on 03/22/2024 at 8:56 AM   -  To page go to [email or web address]     "

## 2024-03-22 NOTE — Progress Notes (Signed)
 Pt accepts to have glucose checked but refuses insulin  even though glucose levels need coverage per sliding scale. Pt has been refusing even on prior shifts per shift report and MAR. Education provided.

## 2024-03-22 NOTE — Plan of Care (Signed)

## 2024-03-22 NOTE — [place] FL2 (Addendum)
 " [place]  MEDICAID FL2 LEVEL OF CARE FORM     IDENTIFICATION  Patient Name: John Walton Birthdate: March 05, 1933 Sex: male Admission Date (Current Location): 03/18/2024  [facility] and [place] Number:  Producer, Television/film/video and Address:  [facility] [place], [address] [place], [place] [address]      Provider Number: [id number]  Attending Physician Name and Address:  Dennise Lavada POUR, MD  Relative Name and Phone Number:       Current Level of Care: Hospital Recommended Level of Care: Skilled Nursing Facility Prior Approval Number:    Date Approved/Denied:   PASRR Number: [id number]  Discharge Plan: SNF    Current Diagnoses: Patient Active Problem List   Diagnosis Date Noted   History of CVA (cerebrovascular accident) 03/18/2024   Glaucoma 03/18/2024   CAP (community acquired pneumonia) 03/18/2024   Hyponatremia 03/18/2024   Vascular dementia (HCC) 03/18/2024   Acute respiratory failure with hypoxia (HCC) 03/18/2024   Aspiration pneumonia (HCC) 01/20/2024   Malnutrition of moderate degree 01/20/2024   Syncope and collapse 01/18/2024   Acute renal failure superimposed on stage 4 chronic kidney disease (HCC) 06/18/2023   Central retinal artery occlusion, left eye 07/29/2022   Gastro-esophageal reflux disease without esophagitis 07/29/2022   Nonrheumatic aortic (valve) stenosis 07/29/2022   S/P TURP (status post transurethral resection of prostate) 12/19/2020   ABLA (acute blood loss anemia) 12/19/2020   Symptomatic stenosis of right carotid artery 12/19/2020   BPH with obstruction/lower urinary tract symptoms 12/14/2020   Hypocalcemia    Protein-calorie malnutrition, severe 03/29/2020   Hypokalemia 03/29/2020   Hypernatremia 03/29/2020   DM (diabetes mellitus), type 2 (HCC) 03/29/2020   Fever 03/29/2020   Weakness 03/29/2020   Dysphagia 03/26/2020   Bilateral hydronephrosis 03/08/2020   Unilateral primary osteoarthritis, left hip     Femoral neck fracture (HCC) 03/07/2020   CKD (chronic kidney disease), stage IV (HCC) 03/07/2020   Normocytic anemia 03/07/2020   BPH (benign prostatic hyperplasia) 03/07/2020   Abdominal mass 03/07/2020   Scalp hematoma 03/07/2020   Hypertensive urgency 03/07/2020   Pain due to onychomycosis of toenails of both feet 08/13/2019   RBBB 09/02/2014   Medicare annual wellness visit, subsequent 07/11/2014   Essential hypertension 01/11/2014   Hyperlipidemia 01/11/2014    Orientation RESPIRATION BLADDER Height & Weight     Self, Place  Normal Incontinent Weight: 160 lb 11.5 oz (72.9 kg) Height:  5' 11 (180.3 cm) (Pt unsure  but best guess)  BEHAVIORAL SYMPTOMS/MOOD NEUROLOGICAL BOWEL NUTRITION STATUS      Incontinent Feeding tube  AMBULATORY STATUS COMMUNICATION OF NEEDS Skin   Extensive Assist Verbally Normal                       Personal Care Assistance Level of Assistance  Bathing, Feeding, Dressing Bathing Assistance: Maximum assistance Feeding assistance: Limited assistance Dressing Assistance: Maximum assistance     Functional Limitations Info  Sight, Hearing Sight Info: Impaired Hearing Info: Impaired      SPECIAL CARE FACTORS FREQUENCY  PT (By licensed PT), OT (By licensed OT)     PT Frequency: 5x/week OT Frequency: 5x/week            Contractures Contractures Info: Not present    Additional Factors Info  Code Status, Allergies Code Status Info: DNR-limited Allergies Info: Other, Influenza Vaccines, Robitussin Cold Cough+ Chest (Dextromethorphan -guaifenesin )           Current Medications (03/22/2024):  This is  the current hospital active medication list Current Facility-Administered Medications  Medication Dose Route Frequency Provider Last Rate Last Admin   acetaminophen  (TYLENOL ) tablet 650 mg  650 mg Per Tube Q6H PRN Melvin, Alexander B, MD       Or   acetaminophen  (TYLENOL ) suppository 650 mg  650 mg Rectal Q6H PRN Seena Marsa NOVAK, MD        artificial tears (LACRILUBE) ophthalmic ointment   Both Eyes BID Seena Marsa NOVAK, MD   Given at 03/22/24 9180   aspirin  EC tablet 81 mg  81 mg Oral Daily Melvin, Alexander B, MD   81 mg at 03/22/24 0818   atorvastatin  (LIPITOR) tablet 40 mg  40 mg Oral QHS Melvin, Alexander B, MD   40 mg at 03/21/24 2139   azithromycin  (ZITHROMAX ) 500 mg in sodium chloride  0.9 % 250 mL IVPB  500 mg Intravenous Q24H Seena Marsa NOVAK, MD 250 mL/hr at 03/22/24 0829 500 mg at 03/22/24 9170   bacitracin  ointment   Topical TID Singh, Prashant K, MD   31.1111 Application at 03/22/24 [phone]   brinzolamide  (AZOPT ) 1 % ophthalmic suspension 1 drop  1 drop Both Eyes BID Seena Marsa NOVAK, MD   1 drop at 03/22/24 9180   And   brimonidine  (ALPHAGAN ) 0.2 % ophthalmic solution 1 drop  1 drop Both Eyes BID Melvin, Alexander B, MD   1 drop at 03/22/24 9180   cefTRIAXone  (ROCEPHIN ) 2 g in sodium chloride  0.9 % 100 mL IVPB  2 g Intravenous Q24H Seena Marsa NOVAK, MD 200 mL/hr at 03/22/24 0827 2 g at 03/22/24 0827   chlorpheniramine-HYDROcodone  (TUSSIONEX) 10-8 MG/5ML suspension 5 mL  5 mL Per Tube Q12H PRN Howerter, Justin B, DO   5 mL at 03/21/24 0431   cyanocobalamin  (VITAMIN B12) tablet 500 mcg  500 mcg Per Tube BID Singh, Prashant K, MD   500 mcg at 03/22/24 1208   feeding supplement (OSMOLITE 1.5 CAL) liquid 1,000 mL  1,000 mL Per Tube Continuous Singh, Prashant K, MD 45 mL/hr at 03/22/24 0433 Infusion Verify at 03/22/24 0433   insulin  aspart (novoLOG ) injection 0-9 Units  0-9 Units Subcutaneous Q4H Melvin, Alexander B, MD   5 Units at 03/22/24 1208   insulin  glargine (LANTUS ) injection 10 Units  10 Units Subcutaneous Daily Singh, Prashant K, MD   10 Units at 03/22/24 1208   latanoprost  (XALATAN ) 0.005 % ophthalmic solution 1 drop  1 drop Both Eyes QHS Melvin, Alexander B, MD   1 drop at 03/21/24 2144   methimazole  (TAPAZOLE ) tablet 5 mg  5 mg Oral Daily Melvin, Alexander B, MD   5 mg at 03/22/24 9181   metoprolol   tartrate (LOPRESSOR ) injection 2.5 mg  2.5 mg Intravenous Q4H PRN Howerter, Justin B, DO       ondansetron  (ZOFRAN ) injection 4 mg  4 mg Intravenous Q6H PRN Singh, Prashant K, MD       prednisoLONE  acetate (PRED FORTE ) 1 % ophthalmic suspension 1 drop  1 drop Left Eye Daily Seena Marsa NOVAK, MD   1 drop at 03/22/24 9180   sodium chloride  flush (NS) 0.9 % injection 3 mL  3 mL Intravenous Q12H Seena Marsa NOVAK, MD   3 mL at 03/22/24 9178   thiamine  (VITAMIN B1) tablet 100 mg  100 mg Per Tube Daily Singh, Prashant K, MD   100 mg at 03/22/24 9181     Discharge Medications: Please see discharge summary for a list of discharge medications.  Relevant Imaging  Results:  Relevant Lab Results:   Additional Information ssn:[id number]. Medicare A and B: [id number]  Inocente GORMAN Kindle, LCSW     "

## 2024-03-22 NOTE — TOC Initial Note (Signed)
 Transition of Care [facility]) - Initial/Assessment Note    Patient Details  Name: John Walton MRN: [id number] Date of Birth: May 20, 1932  Transition of Care [facility]) CM/SW Contact:    Inocente GORMAN Kindle, LCSW Phone Number: 03/22/2024, 1:34 PM  Clinical Narrative:                 SNF recommended for patient and per MD, spouse not able to take care of him at home. CSW left voicemail for spouse.     Expected Discharge Plan: Skilled Nursing Facility Barriers to Discharge: Continued Medical Work up, SNF Pending bed offer   Patient Goals and CMS Choice Patient states their goals for this hospitalization and ongoing recovery are:: Rehab CMS Medicare.gov Compare Post Acute Care list provided to:: Patient Represenative (must comment) Choice offered to / list presented to : Spouse [place] ownership interest in [facility].provided to:: Spouse    Expected Discharge Plan and Services In-house Referral: Clinical Social Work   Post Acute Care Choice: Skilled Nursing Facility Living arrangements for the past 2 months: Single Family Home                                      Prior Living Arrangements/Services Living arrangements for the past 2 months: Single Family Home Lives with:: Spouse Patient language and need for interpreter reviewed:: Yes Do you feel safe going back to the place where you live?: Yes      Need for Family Participation in Patient Care: Yes (Comment) Care giver support system in place?: Yes (comment)   Criminal Activity/Legal Involvement Pertinent to Current Situation/Hospitalization: No - Comment as needed  Activities of Daily Living   ADL Screening (condition at time of admission) Independently performs ADLs?: No Does the patient have a NEW difficulty with bathing/dressing/toileting/self-feeding that is expected to last >3 days?: No Does the patient have a NEW difficulty with getting in/out of bed, walking, or climbing stairs that is expected to  last >3 days?: No Does the patient have a NEW difficulty with communication that is expected to last >3 days?: No Is the patient deaf or have difficulty hearing?: Yes Does the patient have difficulty seeing, even when wearing glasses/contacts?: No Does the patient have difficulty concentrating, remembering, or making decisions?: No  Permission Sought/Granted Permission sought to share information with : Facility Medical Sales Representative, Family Supports Permission granted to share information with : No  Share Information with NAME: Lavaun Slater Meckel [phone]  Permission granted to share info w AGENCY: SNFs        Emotional Assessment Appearance:: Appears stated age Attitude/Demeanor/Rapport: Unable to Assess Affect (typically observed): Unable to Assess Orientation: : Oriented to Self, Oriented to Place Alcohol  / Substance Use: Not Applicable Psych Involvement: No (comment)  Admission diagnosis:  Hyponatremia [E87.1] Elevated troponin [R79.89] Sepsis due to pneumonia (HCC) [J18.9, A41.9] Multifocal pneumonia [J18.8] Patient Active Problem List   Diagnosis Date Noted   History of CVA (cerebrovascular accident) 03/18/2024   Glaucoma 03/18/2024   CAP (community acquired pneumonia) 03/18/2024   Hyponatremia 03/18/2024   Vascular dementia (HCC) 03/18/2024   Acute respiratory failure with hypoxia (HCC) 03/18/2024   Aspiration pneumonia (HCC) 01/20/2024   Malnutrition of moderate degree 01/20/2024   Syncope and collapse 01/18/2024   Acute renal failure superimposed on stage 4 chronic kidney disease (HCC) 06/18/2023   Central retinal artery occlusion, left eye 07/29/2022   Gastro-esophageal reflux disease without  esophagitis 07/29/2022   Nonrheumatic aortic (valve) stenosis 07/29/2022   S/P TURP (status post transurethral resection of prostate) 12/19/2020   ABLA (acute blood loss anemia) 12/19/2020   Symptomatic stenosis of right carotid artery 12/19/2020   BPH with  obstruction/lower urinary tract symptoms 12/14/2020   Hypocalcemia    Protein-calorie malnutrition, severe 03/29/2020   Hypokalemia 03/29/2020   Hypernatremia 03/29/2020   DM (diabetes mellitus), type 2 (HCC) 03/29/2020   Fever 03/29/2020   Weakness 03/29/2020   Dysphagia 03/26/2020   Bilateral hydronephrosis 03/08/2020   Unilateral primary osteoarthritis, left hip    Femoral neck fracture (HCC) 03/07/2020   CKD (chronic kidney disease), stage IV (HCC) 03/07/2020   Normocytic anemia 03/07/2020   BPH (benign prostatic hyperplasia) 03/07/2020   Abdominal mass 03/07/2020   Scalp hematoma 03/07/2020   Hypertensive urgency 03/07/2020   Pain due to onychomycosis of toenails of both feet 08/13/2019   RBBB 09/02/2014   Medicare annual wellness visit, subsequent 07/11/2014   Essential hypertension 01/11/2014   Hyperlipidemia 01/11/2014   PCP:  Waymond Ryan Askew, MD Pharmacy:   [facility] [place] [facility] [address] [place] [address] Phone: [phone] Fax: [phone]  [facility] [place], [place] - [address] [place] [address] Phone: [phone] Fax: [phone]  [facility] [address] [place] [address] Phone: [phone] Fax: [phone]     Social Drivers of Health (SDOH) Social History: SDOH Screenings   Food Insecurity: No Food Insecurity (03/19/2024)  Housing: Low Risk (03/19/2024)  Transportation Needs: No Transportation Needs (03/19/2024)  Utilities: Not At Risk (03/19/2024)  Social Connections: Socially Isolated (03/19/2024)  Tobacco Use: Medium Risk (03/19/2024)   SDOH Interventions:     Readmission Risk Interventions    03/22/2024    1:32 PM  Readmission Risk Prevention Plan  Transportation Screening Complete  Medication Review (RN Care Manager) Complete  PCP or Specialist  appointment within 3-5 days of discharge Complete  HRI or Home Care Consult Complete  SW Recovery Care/Counseling Consult Complete  Palliative Care Screening Not Applicable  Skilled Nursing Facility Complete

## 2024-03-22 NOTE — Progress Notes (Addendum)
 Nutrition Follow-up  DOCUMENTATION CODES:   Severe malnutrition in context of chronic illness  INTERVENTION:   Monitor for leakage around PEG insertion site post initiation of TF   Tube Feeding via PEG: -Glucerna 1.5 at 45 ml/hr (1080 ml per day) May initiate at goal rate  -Add FWF 150ml QID (600 ml per day) -TF provides 1620 kcals, 89 g of protein, 820 mL of free water  (1420 mL total per day FWF+ TF)   Continue Thiamine  100 mg daily x 7 days  Continue monitor for refeeding risk magnesium , potassium, and phosphorus daily for 1-2 more days. MD to replete as needed   Recommend transition to bolus feedings once tolerating TF at goal rate    NUTRITION DIAGNOSIS:  Severe Malnutrition related to chronic illness as evidenced by severe fat depletion, severe muscle depletion.  GOAL:  Patient will meet greater than or equal to 90% of their needs  MONITOR:  TF tolerance, Labs, Weight trends, Skin  REASON FOR ASSESSMENT:   Consult Enteral/tube feeding initiation and management  ASSESSMENT:   89 yo male admitted with weakness and cough  with AKI on CKD 4 with metabolic acidosis and electrolyte derangements, +acute respiratory failure with pneumonia, elevated BNP with hx of HF and aortic stenosis with hyponatremia with edema present on exam. PMH includes chronic systolic heart failure, severe aortic stenosis, DM, GERD, HTN, HLD, CKD 4, CAD, CVA, G-tube placement, vascular dementia with gradual decline in mental status, HoH (speak loudly into L. Ear)  Currently NPO, hx of dysphagia, SLP consulted. MBS completed yesterday with recommendations to remain NPO. G-tube place for years; previously used for free water  for hydration and for supplemental nutrition as needed.   Received request from diabetes coordinator and MD regarding blood sugars since initiation of tube feedings. Per RN, patient has been refusing insulin . Receiving correct regimen today. He is tolerating tube feeds at goal rate  with no reported or documented N/V/C/D.   Given that patient may still refuse insulin  at SNF facility, when placed, will modify to lower carbohydrate formula to promote desirable blood glucose trends and require as little insulin  as possible. Notably, patient with documented poor control at baseline. A1c 7.9% today and consistent with average blood glucose of 204 mg/dL.  Remains on SS Novolog  and Lantus  added today. Discussed with diabetes coordinator. Will likely need another insulin  adjustment after transition to diabetes-appropriate formula.  Admit Weight: 101.2 kg - appears errouneous Current Weight: 72.9 kg   New weight obtained and height entered. Re-estimated needs based on his updated anthropometrics as well as NFPE findings. Meets criteria for severe malnutrition in the context of chronic illness.    Sodium trends improving. MD amicable to adding FWF tomorrow. BUN/Crt stable. Refeeding labs stable. Continue to monitor.   Labs: Sodium 134 (L) BUN 78 Creatinine 2.48 Potassium 4.7 (wdl) Magnesium  2.5 (H) Phosphorus 3.6 (wdl) CRP 21.7 (H) CBGs 277-385 (goal <180 with avoidance of hypoglycemic episodes)   Meds: SS novolog   Cyanocobalamin  Lantus  thiamine   NUTRITION - FOCUSED PHYSICAL EXAM:  Flowsheet Row Most Recent Value  Orbital Region Moderate depletion  Upper Arm Region Moderate depletion  Thoracic and Lumbar Region Severe depletion  Buccal Region Severe depletion  Temple Region Moderate depletion  Clavicle Bone Region Moderate depletion  Clavicle and Acromion Bone Region Severe depletion  Scapular Bone Region Severe depletion  Dorsal Hand Mild depletion  Patellar Region Mild depletion  Anterior Thigh Region Moderate depletion  Posterior Calf Region Severe depletion  Edema (RD Assessment) None  Hair Reviewed  Eyes Reviewed  Mouth Reviewed  Skin Reviewed  Nails Reviewed    Diet Order:   Diet Order             Diet NPO time specified Except for: Other (See  Comments)  Diet effective now                  EDUCATION NEEDS:  No education needs have been identified at this time  Skin:  Skin Assessment: Reviewed RN Assessment  Last BM:  12/22 - type 6 x1  Height:  Ht Readings from Last 1 Encounters:  03/21/24 5' 11 (1.803 m)   Weight:  Wt Readings from Last 1 Encounters:  03/22/24 72.9 kg   Ideal Body Weight:  78.2 kg  BMI:  Body mass index is 22.42 kg/m.  Estimated Nutritional Needs:   Kcal:  1600-1800 kcals  Protein:  75-90g  Fluid:  >1.6L/day  Blair Deaner MS, RD, LDN Registered Dietitian Clinical Nutrition RD Inpatient Contact Info in Amion

## 2024-03-22 NOTE — Progress Notes (Signed)
 PT Cancellation Note  Patient Details Name: John Walton MRN: [id number] DOB: 07-14-1932   Cancelled Treatment:    Reason Eval/Treat Not Completed: Fatigue/lethargy limiting ability to participate;Medical issues which prohibited therapy. SLP informed PT that patient had a large aspiration episode with significant difficulty coughing and clearing fluids, and requiring donning of 2L supplemental O2. Pt attempted to talk with patient however continued to have large coughing spells and clearing some fluid however quickly swallows and does not allow for suction to clear secretions. O2 sats 86-89% on 2L, pt very fatigued with difficulty participating in conversation, poor command following to allow suction. PT discussed use of suction with caregiver present at bedside. PT will re-attempt therapy at later date.  Isaiah DEL. Boluwatife Flight, PT, DPT   [employer] 03/22/2024, 2:48 PM

## 2024-03-22 NOTE — Progress Notes (Signed)
 Speech Language Pathology Treatment: Dysphagia  Patient Details Name: John Walton MRN: [id number] DOB: 03/01/1933 Today's Date: 03/22/2024 Time: 8645-8576 SLP Time Calculation (min) (ACUTE ONLY): 29 min  Assessment / Plan / Recommendation Clinical Impression  PLAN: Continue NPO; allow ice chips after oral care if pt requests.  Pt's swallowing has deteriorated since October 2025.  He presents with obvious aspiration at the bedside with significant coughing and eventual desaturations.   John Walton is known to this clinician from prior admissions, two prior MBS. His clinical presentation is much worse than during prior meetings.  Today he allowed oral care.  Accepted a few bites of applesauce and a sip of apple juice, leading to explosive, wet coughing that was unremitting over the course of the next 45 minutes.  He was reluctant to allow oral suctioning and tended to cough then re-swallow the mucus/applesauce repeatedly.    2L [place] applied. RN at the bedside - pt having difficulty recovering from episode; exhausted.   Will f/u with his wife tomorrow re: concerns for deteriorated swallow function.      HPI HPI: 89 yo male presenting to ED 12/18 with weakness and cough. Admitted with multifocal pneumonia and metabolic abnormalities. CXR shows patchy airspace opacities in L mid and lower lung stable patchy airspace opacities in the R mid and lower lung. Multiple prior MBS (most recent 01/12/24) with history of chronic pharyngeal dysphagia due to DISH with occasional aspiration but no previous episodes of pneumonia. S/p G-tube for hydration but continues to eat a regular diet. PMH includes HOH, HTN, HLD, GERD, DM, CKD 4, prior CVA, aortic stenosis, hypothyroidism, glaucoma, BPH s/p TURP, anemia, dysphagia s/p G-tube placement      SLP Plan  Continue with current plan of care        Swallow Evaluation Recommendations   Recommendations: NPO;Ice chips PRN after oral care Oral care  recommendations: Oral care QID (4x/day) Caregiver Recommendations: Have oral suction available     Recommendations                     Oral care QID;Oral care prior to ice chip/H20   Frequent or constant Supervision/Assistance Dysphagia, pharyngeal phase (R13.13)     Continue with current plan of care   John Walton L. [name], MA CCC/SLP Clinical Specialist - Acute Care SLP Acute Rehabilitation Services Office number [phone]   John Walton  03/22/2024, 2:36 PM

## 2024-03-23 ENCOUNTER — Inpatient Hospital Stay (HOSPITAL_COMMUNITY)

## 2024-03-23 ENCOUNTER — Other Ambulatory Visit (HOSPITAL_COMMUNITY): Payer: Self-pay

## 2024-03-23 DIAGNOSIS — I2510 Atherosclerotic heart disease of native coronary artery without angina pectoris: Secondary | ICD-10-CM

## 2024-03-23 DIAGNOSIS — I209 Angina pectoris, unspecified: Secondary | ICD-10-CM | POA: Diagnosis not present

## 2024-03-23 DIAGNOSIS — I350 Nonrheumatic aortic (valve) stenosis: Secondary | ICD-10-CM

## 2024-03-23 DIAGNOSIS — E871 Hypo-osmolality and hyponatremia: Secondary | ICD-10-CM | POA: Diagnosis not present

## 2024-03-23 DIAGNOSIS — R1310 Dysphagia, unspecified: Secondary | ICD-10-CM

## 2024-03-23 DIAGNOSIS — I129 Hypertensive chronic kidney disease with stage 1 through stage 4 chronic kidney disease, or unspecified chronic kidney disease: Secondary | ICD-10-CM

## 2024-03-23 DIAGNOSIS — J690 Pneumonitis due to inhalation of food and vomit: Secondary | ICD-10-CM

## 2024-03-23 DIAGNOSIS — Z66 Do not resuscitate: Secondary | ICD-10-CM

## 2024-03-23 DIAGNOSIS — E1122 Type 2 diabetes mellitus with diabetic chronic kidney disease: Secondary | ICD-10-CM

## 2024-03-23 DIAGNOSIS — Z515 Encounter for palliative care: Secondary | ICD-10-CM

## 2024-03-23 DIAGNOSIS — G9340 Encephalopathy, unspecified: Secondary | ICD-10-CM

## 2024-03-23 DIAGNOSIS — N189 Chronic kidney disease, unspecified: Secondary | ICD-10-CM

## 2024-03-23 LAB — ECHOCARDIOGRAM COMPLETE
AR max vel: 0.86 cm2
AV Area VTI: 0.84 cm2
AV Area mean vel: 0.81 cm2
AV Mean grad: 17 mmHg
AV Peak grad: 28.9 mmHg
Ao pk vel: 2.69 m/s
Calc EF: 23.2 %
Height: 71 in
S' Lateral: 4 cm
Single Plane A2C EF: 24.5 %
Single Plane A4C EF: 22.5 %
Weight: 2627.88 [oz_av]

## 2024-03-23 LAB — CBC WITH DIFFERENTIAL/PLATELET
Abs Immature Granulocytes: 0.86 K/uL — ABNORMAL HIGH (ref 0.00–0.07)
Basophils Absolute: 0.1 K/uL (ref 0.0–0.1)
Basophils Relative: 0 %
Eosinophils Absolute: 0.1 K/uL (ref 0.0–0.5)
Eosinophils Relative: 1 %
HCT: 23.5 % — ABNORMAL LOW (ref 39.0–52.0)
Hemoglobin: 7.5 g/dL — ABNORMAL LOW (ref 13.0–17.0)
Immature Granulocytes: 7 %
Lymphocytes Relative: 4 %
Lymphs Abs: 0.5 K/uL — ABNORMAL LOW (ref 0.7–4.0)
MCH: 29.6 pg (ref 26.0–34.0)
MCHC: 31.9 g/dL (ref 30.0–36.0)
MCV: 92.9 fL (ref 80.0–100.0)
Monocytes Absolute: 1 K/uL (ref 0.1–1.0)
Monocytes Relative: 8 %
Neutro Abs: 10.2 K/uL — ABNORMAL HIGH (ref 1.7–7.7)
Neutrophils Relative %: 80 %
Platelets: 356 K/uL (ref 150–400)
RBC: 2.53 MIL/uL — ABNORMAL LOW (ref 4.22–5.81)
RDW: 14.4 % (ref 11.5–15.5)
Smear Review: NORMAL
WBC: 12.8 K/uL — ABNORMAL HIGH (ref 4.0–10.5)
nRBC: 0.4 % — ABNORMAL HIGH (ref 0.0–0.2)

## 2024-03-23 LAB — TROPONIN T, HIGH SENSITIVITY
Troponin T High Sensitivity: 257 ng/L (ref 0–19)
Troponin T High Sensitivity: 252 ng/L (ref 0–19)

## 2024-03-23 LAB — COMPREHENSIVE METABOLIC PANEL WITH GFR
ALT: 13 U/L (ref 0–44)
AST: 21 U/L (ref 15–41)
Albumin: 3.3 g/dL — ABNORMAL LOW (ref 3.5–5.0)
Alkaline Phosphatase: 113 U/L (ref 38–126)
Anion gap: 13 (ref 5–15)
BUN: 83 mg/dL — ABNORMAL HIGH (ref 8–23)
CO2: 16 mmol/L — ABNORMAL LOW (ref 22–32)
Calcium: 8.9 mg/dL (ref 8.9–10.3)
Chloride: 110 mmol/L (ref 98–111)
Creatinine, Ser: 2.55 mg/dL — ABNORMAL HIGH (ref 0.61–1.24)
GFR, Estimated: 23 mL/min — ABNORMAL LOW
Glucose, Bld: 175 mg/dL — ABNORMAL HIGH (ref 70–99)
Potassium: 5.4 mmol/L — ABNORMAL HIGH (ref 3.5–5.1)
Sodium: 139 mmol/L (ref 135–145)
Total Bilirubin: <0.2 mg/dL (ref 0.0–1.2)
Total Protein: 6.5 g/dL (ref 6.5–8.1)

## 2024-03-23 LAB — GLUCOSE, CAPILLARY
Glucose-Capillary: 173 mg/dL — ABNORMAL HIGH (ref 70–99)
Glucose-Capillary: 251 mg/dL — ABNORMAL HIGH (ref 70–99)
Glucose-Capillary: 269 mg/dL — ABNORMAL HIGH (ref 70–99)
Glucose-Capillary: 280 mg/dL — ABNORMAL HIGH (ref 70–99)

## 2024-03-23 LAB — CULTURE, BLOOD (ROUTINE X 2)
Culture: NO GROWTH
Special Requests: ADEQUATE

## 2024-03-23 LAB — PHOSPHORUS: Phosphorus: 4.6 mg/dL (ref 2.5–4.6)

## 2024-03-23 LAB — MAGNESIUM: Magnesium: 2.4 mg/dL (ref 1.7–2.4)

## 2024-03-23 LAB — C-REACTIVE PROTEIN: CRP: 11.4 mg/dL — ABNORMAL HIGH

## 2024-03-23 LAB — PROCALCITONIN: Procalcitonin: 0.59 ng/mL

## 2024-03-23 MED ORDER — PERFLUTREN LIPID MICROSPHERE
1.0000 mL | INTRAVENOUS | Status: DC | PRN
  Administered 2024-03-23: 2 mL via INTRAVENOUS

## 2024-03-23 MED ORDER — MIDODRINE HCL 5 MG PO TABS: 10.0000 mg | ORAL_TABLET | Freq: Once | ORAL | Status: DC

## 2024-03-23 MED ORDER — ALBUMIN HUMAN 25 % IV SOLN
50.0000 g | Freq: Once | INTRAVENOUS | Status: AC
  Administered 2024-03-23: 50 g via INTRAVENOUS
  Filled 2024-03-23: qty 200

## 2024-03-23 MED ORDER — NITROGLYCERIN 2 % TD OINT
0.5000 [in_us] | TOPICAL_OINTMENT | Freq: Three times a day (TID) | TRANSDERMAL | Status: DC
  Administered 2024-03-23: 0.5 [in_us] via TOPICAL
  Filled 2024-03-23: qty 30

## 2024-03-23 MED ORDER — SODIUM CHLORIDE 0.9 % IV SOLN
3.0000 g | Freq: Two times a day (BID) | INTRAVENOUS | Status: DC
  Administered 2024-03-23: 3 g via INTRAVENOUS
  Filled 2024-03-23: qty 8

## 2024-03-23 MED ORDER — LACTATED RINGERS IV BOLUS
1000.0000 mL | Freq: Once | INTRAVENOUS | Status: AC
  Administered 2024-03-23: 1000 mL via INTRAVENOUS

## 2024-03-23 MED ORDER — MIDODRINE HCL 5 MG PO TABS
10.0000 mg | ORAL_TABLET | Freq: Once | ORAL | Status: AC
  Administered 2024-03-23: 10 mg
  Filled 2024-03-23: qty 2

## 2024-03-23 MED ORDER — SODIUM ZIRCONIUM CYCLOSILICATE 10 G PO PACK
10.0000 g | PACK | Freq: Two times a day (BID) | ORAL | Status: DC
  Administered 2024-03-23: 10 g
  Filled 2024-03-23: qty 1

## 2024-03-23 MED ORDER — ATORVASTATIN CALCIUM 40 MG PO TABS: 40.0000 mg | ORAL_TABLET | Freq: Every day | ORAL | Status: DC

## 2024-03-23 MED ORDER — IPRATROPIUM-ALBUTEROL 0.5-2.5 (3) MG/3ML IN SOLN
3.0000 mL | Freq: Four times a day (QID) | RESPIRATORY_TRACT | Status: DC
  Administered 2024-03-23 – 2024-03-24 (×2): 3 mL via RESPIRATORY_TRACT
  Filled 2024-03-23 (×2): qty 3

## 2024-03-23 MED ORDER — LORAZEPAM 2 MG/ML IJ SOLN: 1.0000 mg | INTRAMUSCULAR | Status: DC | PRN

## 2024-03-23 MED ORDER — METHIMAZOLE 5 MG PO TABS: 5.0000 mg | ORAL_TABLET | Freq: Every day | ORAL | Status: DC

## 2024-03-23 MED ORDER — SODIUM ZIRCONIUM CYCLOSILICATE 10 G PO PACK
10.0000 g | PACK | Freq: Once | ORAL | Status: AC
  Administered 2024-03-23: 10 g via ORAL
  Filled 2024-03-23: qty 1

## 2024-03-23 MED ORDER — HYDROCORTISONE SOD SUC (PF) 100 MG IJ SOLR
100.0000 mg | Freq: Three times a day (TID) | INTRAMUSCULAR | Status: DC
  Administered 2024-03-23: 100 mg via INTRAVENOUS
  Filled 2024-03-23 (×3): qty 2

## 2024-03-23 MED ORDER — HYDROCORTISONE SOD SUC (PF) 100 MG IJ SOLR
100.0000 mg | INTRAMUSCULAR | Status: DC
  Filled 2024-03-23 (×2): qty 2

## 2024-03-23 MED ORDER — MORPHINE SULFATE (PF) 2 MG/ML IV SOLN: 1.0000 mg | Freq: Four times a day (QID) | INTRAVENOUS | Status: DC | PRN

## 2024-03-23 MED ORDER — BISACODYL 10 MG RE SUPP: 10.0000 mg | Freq: Every day | RECTAL | Status: DC | PRN

## 2024-03-23 MED ORDER — ASPIRIN 81 MG PO CHEW
81.0000 mg | CHEWABLE_TABLET | Freq: Every day | ORAL | Status: DC
  Administered 2024-03-23: 81 mg
  Filled 2024-03-23: qty 1

## 2024-03-23 MED ORDER — BENZONATATE 100 MG PO CAPS: 100.0000 mg | ORAL_CAPSULE | Freq: Once | ORAL | Status: DC

## 2024-03-23 MED ORDER — MIDODRINE HCL 5 MG PO TABS
10.0000 mg | ORAL_TABLET | Freq: Three times a day (TID) | ORAL | Status: DC
  Administered 2024-03-23 (×2): 10 mg
  Filled 2024-03-23 (×2): qty 2

## 2024-03-23 MED ORDER — MORPHINE SULFATE (PF) 2 MG/ML IV SOLN
2.0000 mg | INTRAVENOUS | Status: DC | PRN
  Administered 2024-03-23 – 2024-03-24 (×2): 2 mg via INTRAVENOUS
  Filled 2024-03-23 (×2): qty 1

## 2024-03-23 MED ORDER — FUROSEMIDE 10 MG/ML IJ SOLN
20.0000 mg | Freq: Once | INTRAMUSCULAR | Status: AC
  Administered 2024-03-23: 20 mg via INTRAVENOUS
  Filled 2024-03-23: qty 2

## 2024-03-23 NOTE — Plan of Care (Signed)

## 2024-03-23 NOTE — Progress Notes (Signed)
" °  Echocardiogram 2D Echocardiogram has been performed.  John Walton 03/23/2024, 4:59 PM "

## 2024-03-23 NOTE — Progress Notes (Addendum)
 " PROGRESS NOTE    [name]  FMW:[id number] DOB: 1933/01/07 DOA: 03/18/2024 PCP: Waymond Ryan Askew, MD  Chief Complaint  Patient presents with   Weakness    Brief Narrative:   John Walton is a 89 y.o. male with medical history significant of hypertension, hyperlipidemia, GERD, diabetes, CKD 4, carotid artery disease, CVA, aortic stenosis, hypothyroidism, glaucoma, BPH status post TURP, anemia, status post G-tube presenting with weakness and cough.   Admitted for multifocal pneumonia and metabolic abnormalities.    Assessment & Plan:   Note patient is overall quite elderly and frail, has been PEG tube dependent for feeds for several years, intermittent oral intake, now aspirating on oral intake, has known history of EF around 40% with severe aortic stenosis, overall poor baseline, poor long-term prognosis, he is DNR.  Palliative care following.  Has been explained to the wife that patient has poor long-term prognosis, she is thinking about goals of care.    Hyponatremia Hyperkalemia Elevated gap metabolic acidosis AKI on CKD 4  Due to dehydration improving with IV fluids, patient has history of consuming less fluids and not drinking enough water , dependent on free water  flushes.  Renal ultrasound stable, continue to monitor electrolytes and renal function.  Pneumonia Acute respiratory failure with hypoxia CXR with multifocal pneumonia, trace R effusion Negative covid, flu, RSV Urine strep, urine legionella, sputum culture Initially improved on Rocephin  azithromycin  however looks like he has aspirated again while working with SLP on 03/22/2024, n.p.o. by mouth, check chest x-ray, switch to Unasyn  for 5 days and monitor. Encouraged to sit in chair use I-S and flutter valve for pulmonary toiletry, advance activity and titrate down oxygen.  Note patient also developing coarse B sounds, hypotension, likely getting septic post aspiration, Unasyn , Steroids, midodrine , albumin  &  IVF >> explained to the wife, since frail >> thrid spacing of fluids will occur, she wants to continue for now.  Addendum - now developing rales and hypoxia, hold TF, IVF, gentle lasix  - prognosis +++ poor, explained clearly to the wife x 3. Note yesterday she wanted comfort care but today wants to hold off for now.  Note per care team patient's John Walton is Alyce his step son [phone].  - called, he agrees with DNR, Med Rx, will think about comfort if declines further, he will talk to the family (talk again in 30 mins) , wife requests PCCM input, will get, called Alyce again in 30 mins >> 40 mins, no response.   Final DW sean and wife 7.00 pm - Full comfort care now.      Chronic anemia Downtrending in setting of receiving IVF Will trend  Status post G-tube History of dysphagia Allergy to cough medications > Patient is status post G-tube years ago per family; when he had an allergic reaction to cough medicine causing narrowing of his airway/esophagus.  Was on this for some time and then had improvement was able to swallow better. > Has had intermittent issues with dysphagia and has had aspiration pneumonia in the past and so has maintained G-tube in place to use for supplemental nutrition or as needed. > Swallow study was performed in October of this year and recommendation was for p.o. diet, regular.  Thin liquids.  Medication to be administered by alternative means. - Have speech therapy see him to advance his oral diet as appropriate, there is some redness around the PEG tube site for which IR is consulted, tube actually had malfunctioned with the balloon likely punctured, tube  was exchanged on 03/20/2024, now functioning well, overall stable continue Topical cream bacitracin  around the tube site for some local irritation.   Troponin elevation Appreciate cards assistance, thought due demand in the setting of pneumonia, metabolic derangement, lactic acidosis, AKI - no further cardiac    Vascular dementia Hard of Hearing (speak loudly into his L ear) > Family reports patient had a gradual decline in his mental status especially over the past 6 months to a year.   Delirium precautions   Severe Aortic stenosis Mitral stenosis HFmrEF  Low normal RVSF > Echo in October showed moderate mitral stenosis and severe aortic stenosis with EF 40-45%. Caution with IVF, developed chest pain overnight 03/23/2024, EKG nonacute, troponin mildly elevated but equal to the levels that he had few days ago, trend is not in ACS pattern, he is not a candidate for invasive procedures, already on aspirin  and statin, blood pressure too low for beta-blocker.  Will request cardiology to opine, patient will benefit from medical treatment, if declines transition to comfort, this has been discussed with wife in detail on multiple occasions by me.   Hyperthyroidism methimazole    Hyperlipidemia - Continue home atorvastatin    Carotid artery disease History of CVA - Continue home ASA, atorvastatin    Glaucoma  - Continue eyedrops  Diabetes - Sugars in poor control on SSI, add Lantus , dietitian requested to change tube feeds.  Check A1c.   CBG (last 3)  Recent Labs    03/22/24 2355 03/23/24 0415 03/23/24 0807  GLUCAP 143* 173* 251*   Lab Results  Component Value Date   HGBA1C 7.9 (H) 03/22/2024       DVT prophylaxis: SCD Code Status: DNR Family Communication: Updated wife [phone]  in detail on 03/20/2024, 03/21/24, 03/22/2024, 03/23/2024 Disposition:   Status is: Inpatient Remains inpatient appropriate because: need for continued inpatient care   Consultants:  IR, palliative care, cardiology  Procedures:  Echocardiogram ordered 03/23/2024  Subjective:  Patient in bed, having some intermittent chest pain, says it is on the left side near his upper chest wall, no radiation, no aggravating relieving factors, no abdominal pain or nausea.  He thinks he is  dying,   Objective: Vitals:   03/23/24 0500 03/23/24 0600 03/23/24 0700 03/23/24 0805  BP: (!) 101/56 (!) 93/54 111/63   Pulse: 99 99 (!) 104   Resp: (!) 22  (!) 22   Temp:    (!) 97.5 F (36.4 C)  TempSrc:    Axillary  SpO2: 95% 95% 93%   Weight:      Height:        Intake/Output Summary (Last 24 hours) at 03/23/2024 0923 Last data filed at 03/23/2024 0517 Gross per 24 hour  Intake 940.25 ml  Output --  Net 940.25 ml    Filed Weights   03/21/24 0500 03/22/24 0500 03/23/24 0411  Weight: 101.2 kg 72.9 kg 74.5 kg    Examination:  Awake Alert, No new F.N deficits, Normal affect, ++ hearing loss, PEG in place some redness and discharge around the site [place].AT,PERRAL Supple Neck, No JVD,   Symmetrical Chest wall movement, Good air movement bilaterally, Coarse B sounds RRR,No Gallops, Rubs or new Murmurs,  +ve B.Sounds, Abd Soft, No tenderness,   No Cyanosis, Clubbing or edema      Data Review:   Patient Lines/Drains/Airways Status     Active Line/Drains/Airways     Name Placement date Placement time Site Days   Peripheral IV 03/18/24 20 G Left Antecubital 03/18/24  1327  Antecubital  5   Peripheral IV 03/18/24 22 G 1.75 Anterior;Distal;Left Forearm 03/18/24  1516  Forearm  5   Gastrostomy/Enterostomy Gastrostomy 20 Fr. LUQ 03/29/20  1223  LUQ  1455   External Urinary Catheter 03/19/24  0109  --  4           Inpatient Medications  Scheduled Meds:  artificial tears   Both Eyes BID   aspirin   81 mg Per Tube Daily   atorvastatin   40 mg Oral QHS   bacitracin    Topical TID   brinzolamide   1 drop Both Eyes BID   And   brimonidine   1 drop Both Eyes BID   cyanocobalamin   500 mcg Per Tube BID   free water   150 mL Per Tube Q6H   insulin  aspart  0-9 Units Subcutaneous Q4H   insulin  glargine  10 Units Subcutaneous Daily   latanoprost   1 drop Both Eyes QHS   methimazole   5 mg Oral Daily   nitroGLYCERIN   0.5 inch Topical Q8H   prednisoLONE  acetate  1 drop Left  Eye Daily   sodium chloride  flush  3 mL Intravenous Q12H   sodium zirconium cyclosilicate   10 g Per Tube BID   thiamine   100 mg Per Tube Daily   Continuous Infusions:  azithromycin  500 mg (03/23/24 0910)   cefTRIAXone  (ROCEPHIN )  IV 2 g (03/23/24 0858)   feeding supplement (GLUCERNA 1.5 CAL) Stopped (03/23/24 0841)   PRN Meds:.acetaminophen  **OR** acetaminophen , chlorpheniramine-HYDROcodone , ipratropium-albuterol , metoprolol  tartrate, morphine  injection, ondansetron  (ZOFRAN ) IV  DVT Prophylaxis  Place and maintain sequential compression device Start: 03/19/24 0914 Place and maintain sequential compression device Start: 03/19/24 0356   Recent Labs  Lab 03/18/24 1320 03/19/24 0326 03/19/24 1413 03/19/24 1919 03/21/24 0336 03/22/24 0414 03/23/24 0348  WBC 14.7* 7.6  --  6.6 6.5 9.9 12.8*  HGB 9.6* 7.7* 7.7* 8.0* 7.8* 7.8* 7.5*  HCT 28.6* 24.3* 23.6* 24.3* 23.3* 24.4* 23.5*  PLT 363 292  --  330 335 365 356  MCV 88.8 92.4  --  91.4 90.3 90.4 92.9  MCH 29.8 29.3  --  30.1 30.2 28.9 29.6  MCHC 33.6 31.7  --  32.9 33.5 32.0 31.9  RDW 12.9 13.0  --  13.2 13.6 13.9 14.4  LYMPHSABS 0.3*  --   --   --  0.4* 0.6* 0.5*  MONOABS 0.6  --   --   --  0.8 0.4 1.0  EOSABS 0.0  --   --   --  0.1 0.4 0.1  BASOSABS 0.0  --   --   --  0.0 0.0 0.1    Recent Labs  Lab 03/18/24 1320 03/18/24 1351 03/18/24 1415 03/18/24 1528 03/18/24 1928 03/19/24 0415 03/19/24 0730 03/19/24 1413 03/19/24 1919 03/19/24 2144 03/20/24 0736 03/20/24 0959 03/21/24 0336 03/22/24 0414 03/23/24 0348  NA 117*  --   --  120*   < >  --    < > 125* 126* 127*  --   --  134* 134* 139  K 5.4*  --   --  5.3*   < >  --    < > 4.7 4.5 4.7  --   --  4.5 4.7 5.4*  CL 89*  --   --  93*   < >  --    < > 98 100 99  --   --  106 106 110  CO2 13*  --   --  12*   < >  --    < >  14* 11* 10*  --   --  13* 15* 16*  ANIONGAP 15  --   --  15   < >  --    < > 13 15 18*  --   --  15 13 13   GLUCOSE 213*  --   --  209*   < >  --     < > 127* 112* 151*  --   --  189* 381* 175*  BUN 72*  --   --  71*   < >  --    < > 72* 74* 75*  --   --  76* 78* 83*  CREATININE 2.81*  --   --  2.69*   < >  --    < > 2.53* 2.47* 2.59*  --   --  2.38* 2.48* 2.55*  AST 29  --   --   --   --   --   --   --   --   --   --   --  21 17 21   ALT 11  --   --   --   --   --   --   --   --   --   --   --  16 16 13   ALKPHOS 104  --   --   --   --   --   --   --   --   --   --   --  104 105 113  BILITOT 0.5  --   --   --   --   --   --   --   --   --   --   --  <0.2 <0.2 <0.2  ALBUMIN  3.7  --   --   --   --   --   --   --   --   --   --   --  3.0* 2.9* 3.3*  CRP  --   --   --   --   --   --   --   --   --   --  21.7*  --  16.2* 12.2* 11.4*  PROCALCITON  --   --   --  2.16  --   --   --   --   --  1.64  --   --  0.99 0.68 0.59  LATICACIDVEN  --  2.0*  --   --   --   --   --  1.3 1.5  --   --   --   --   --   --   INR  --   --  1.2  --   --  1.2  --   --   --   --   --   --   --   --   --   HGBA1C  --   --   --   --   --   --   --   --   --   --   --   --   --  7.9*  --   MG  --   --   --   --   --   --   --   --   --  2.1  --   --  2.3 2.5* 2.4  PHOS  --   --   --   --   --   --   --   --   --   --   --  4.6 4.2 3.6 4.6  CALCIUM  9.0  --   --  8.2*   < >  --    < > 8.0* 8.1* 8.1*  --   --  8.2* 8.6* 8.9   < > = values in this interval not displayed.      Recent Labs  Lab 03/18/24 1351 03/18/24 1415 03/18/24 1528 03/18/24 1928 03/19/24 0415 03/19/24 0730 03/19/24 1413 03/19/24 1919 03/19/24 2144 03/20/24 0736 03/21/24 0336 03/22/24 0414 03/23/24 0348  CRP  --   --   --   --   --   --   --   --   --  21.7* 16.2* 12.2* 11.4*  PROCALCITON  --   --  2.16  --   --   --   --   --  1.64  --  0.99 0.68 0.59  LATICACIDVEN 2.0*  --   --   --   --   --  1.3 1.5  --   --   --   --   --   INR  --  1.2  --   --  1.2  --   --   --   --   --   --   --   --   HGBA1C  --   --   --   --   --   --   --   --   --   --   --  7.9*  --   MG  --   --   --   --    --   --   --   --  2.1  --  2.3 2.5* 2.4  CALCIUM   --   --  8.2*   < >  --    < > 8.0* 8.1* 8.1*  --  8.2* 8.6* 8.9   < > = values in this interval not displayed.   Lab Results  Component Value Date   CHOL 114 12/19/2020   HDL 36 (L) 12/19/2020   LDLCALC 58 12/19/2020   LDLDIRECT 137.0 07/11/2014   TRIG 99 12/19/2020   CHOLHDL 3.2 12/19/2020    Lab Results  Component Value Date   HGBA1C 7.9 (H) 03/22/2024   No results for input(s): TSH, T4TOTAL, FREET4, T3FREE, THYROIDAB in the last 72 hours. No results for input(s): VITAMINB12, FOLATE, FERRITIN, TIBC, IRON, RETICCTPCT in the last 72 hours.  ------------------------------------------------------------------------------------------------------------------ Cardiac Enzymes No results for input(s): CKMB, TROPONINI, MYOGLOBIN in the last 168 hours.  Invalid input(s): CK  Micro Results Recent Results (from the past 240 hours)  Resp panel by RT-PCR (RSV, Flu A&B, Covid) Anterior Nasal Swab     Status: None   Collection Time: 03/18/24  1:20 PM   Specimen: Anterior Nasal Swab  Result Value Ref Range Status   SARS Coronavirus 2 by RT PCR NEGATIVE NEGATIVE Final   Influenza A by PCR NEGATIVE NEGATIVE Final   Influenza B by PCR NEGATIVE NEGATIVE Final    Comment: (NOTE) The Xpert Xpress SARS-CoV-2/FLU/RSV plus assay is intended as an aid in the diagnosis of influenza from Nasopharyngeal swab specimens and should not be used as a sole basis for treatment. Nasal washings and aspirates are unacceptable for Xpert Xpress SARS-CoV-2/FLU/RSV testing.  Fact Sheet for Patients: bloggercourse.com  Fact Sheet for Healthcare Providers: seriousbroker.it  This test is not yet approved or cleared by the [place]  FDA and has been authorized for detection and/or diagnosis of SARS-CoV-2 by FDA  under an Emergency Use Authorization (EUA). This EUA will remain in  effect (meaning this test can be used) for the duration of the COVID-19 declaration under Section 564(b)(1) of the Act, 21 U.S.C. section 360bbb-3(b)(1), unless the authorization is terminated or revoked.     Resp Syncytial Virus by PCR NEGATIVE NEGATIVE Final    Comment: (NOTE) Fact Sheet for Patients: bloggercourse.com  Fact Sheet for Healthcare Providers: seriousbroker.it  This test is not yet approved or cleared by the [place]  FDA and has been authorized for detection and/or diagnosis of SARS-CoV-2 by FDA under an Emergency Use Authorization (EUA). This EUA will remain in effect (meaning this test can be used) for the duration of the COVID-19 declaration under Section 564(b)(1) of the Act, 21 U.S.C. section 360bbb-3(b)(1), unless the authorization is terminated or revoked.  Performed at [facility], [address] [place], [place] [address]   Blood Culture (routine x 2)     Status: None (Preliminary result)   Collection Time: 03/18/24  2:15 PM   Specimen: BLOOD  Result Value Ref Range Status   Specimen Description BLOOD LEFT ANTECUBITAL  Final   Special Requests   Final    BOTTLES DRAWN AEROBIC AND ANAEROBIC Blood Culture adequate volume   Culture   Final    NO GROWTH 4 DAYS Performed at [facility], [address], [place], [place] [address]    Report Status PENDING  Incomplete  Blood Culture (routine x 2)     Status: None (Preliminary result)   Collection Time: 03/19/24  2:13 PM   Specimen: BLOOD  Result Value Ref Range Status   Specimen Description BLOOD RIGHT ANTECUBITAL  Final   Special Requests   Final    BOTTLES DRAWN AEROBIC AND ANAEROBIC Blood Culture adequate volume   Culture   Final    NO GROWTH 3 DAYS Performed at [facility], [address], [place], [place] [address]    Report Status PENDING  Incomplete  MRSA Next Gen by PCR, Nasal     Status: None   Collection Time:  03/19/24  6:01 PM   Specimen: Nasal Mucosa; Nasal Swab  Result Value Ref Range Status   MRSA by PCR Next Gen NOT DETECTED NOT DETECTED Final    Comment: (NOTE) The GeneXpert MRSA Assay (FDA approved for NASAL specimens only), is one component of a comprehensive MRSA colonization surveillance program. It is not intended to diagnose MRSA infection nor to guide or monitor treatment for MRSA infections. Test performance is not FDA approved in patients less than 12 years old. Performed at [facility], [address] [place], [place] [address]     Radiology Reports  DG Chest [place] 1 View Result Date: 03/23/2024 CLINICAL DATA:  Shortness of breath. EXAM: PORTABLE CHEST 1 VIEW COMPARISON:  03/20/2024 FINDINGS: Asymmetric patchy relatively diffuse airspace disease in both lungs is similar to prior. The cardio pericardial silhouette is enlarged. Telemetry leads overlie the chest. IMPRESSION: No substantial interval change. Asymmetric patchy airspace disease in both lungs. Electronically Signed   By: Camellia Candle M.D.   On: 03/23/2024 07:56     Signature  -   Lavada Stank M.D on 03/23/2024 at 9:23 AM   -  To page go to [email or web address]     "

## 2024-03-23 NOTE — Progress Notes (Signed)
 Dr. Franky notified and new orders placed.   03/22/24 2016  Vitals  Temp 98.2 F (36.8 C)  Temp Source Axillary  BP (!) (S)  79/64  MAP (mmHg) 70  BP Location Right Arm  BP Method Automatic  Patient Position (if appropriate) Lying  Pulse Rate Source Monitor  MEWS COLOR  MEWS Score Color Red  Oxygen Therapy  O2 Device Non-rebreather Mask  MEWS Score  MEWS Temp 0  MEWS Systolic 2  MEWS Pulse 2  MEWS RR 2  MEWS LOC 0  MEWS Score 6

## 2024-03-23 NOTE — Progress Notes (Signed)
 Speech Language Pathology Treatment: Dysphagia  Patient Details Name: John Walton MRN: [id number] DOB: Jun 05, 1932 Today's Date: 03/23/2024 Time: 9145-9068 SLP Time Calculation (min) (ACUTE ONLY): 37 min  Assessment / Plan / Recommendation Clinical Impression  PLAN:  Continue NPO with frequent oral care; offer ice chip/small sip of water  but hold at the first sign of discomfort , cough.   Session focused on education with John Walton wife, John Walton, regarding pt's swallowing.  She reports that he began to cough more with POs ~two weeks PTA.  We talked about yesterday's episode of aspiration with unremitting coughing for nearly two hours.  We discussed his decreased reserve and inability to handle aspiration events in the way he could manage them several months ago.  Encouraged her to focus on oral hygiene (4x/day) and allow occasional ice chips and small sips of water , but only if they don't lead to the kind of coughing episode he experienced yesterday.  (Comfort POs should be comfortable.)  Given extensive hx of dysphagia, co-morbidities, compromised mobility and frailty, I do not anticipate meaningful improvements in swallowing.    Palliative care is involved - I encouraged John Walton to continue conversations with them. SLP will follow along.    HPI HPI: 89 yo male presenting to ED 12/18 with weakness and cough. Admitted with multifocal pneumonia and metabolic abnormalities. CXR shows patchy airspace opacities in L mid and lower lung stable patchy airspace opacities in the R mid and lower lung. Multiple prior MBS (most recent 01/12/24) with history of chronic pharyngeal dysphagia due to DISH with occasional aspiration but no previous episodes of pneumonia. S/p G-tube for hydration but continues to eat a regular diet. PMH includes HOH, HTN, HLD, GERD, DM, CKD 4, prior CVA, aortic stenosis, hypothyroidism, glaucoma, BPH s/p TURP, anemia, dysphagia s/p G-tube placement      SLP Plan  Continue with  current plan of care        Swallow Evaluation Recommendations   Recommendations: NPO;Ice chips PRN after oral care Oral care recommendations: Oral care QID (4x/day) Caregiver Recommendations: Have oral suction available     Recommendations                     Oral care QID;Oral care prior to ice chip/H20   Frequent or constant Supervision/Assistance Dysphagia, pharyngeal phase (R13.13)     Continue with current plan of care   John Walton L. [name], MA CCC/SLP Clinical Specialist - Acute Care SLP Acute Rehabilitation Services Office number [phone]   [name]  03/23/2024, 9:38 AM

## 2024-03-23 NOTE — Consult Note (Signed)
 "  NAME:  John Walton, MRN:  [id number], DOB:  1933/03/04, LOS: 5 ADMISSION DATE:  03/18/2024, CONSULTATION DATE:  03/23/24 REFERRING MD:  Dennise, CHIEF COMPLAINT:  SOB   History of Present Illness:   89 yo M with an established DNR/I status w PMH CVA dysphagia chronic aspiration Gtube in situ, AS, CKDIV, CAD,  who was admitted to [facility] 12/18 w weakness and cough. Code status upheld as DNR/I on admission.  He was started on abx for aspiraiton pna, seen by speech, IR exchanged his G tube. He had some interval improvements but unfortunately had what sounds like an aspiration event this admission and has declined again.  Goals of care talks were underway with recommended consideration of comfort measures. Family has asked for second and third opinions. PCCM consulted in this setting    Pertinent  Medical History  DNR CVA Dysphagia G tube in situ  Aortic stenosis Mitral stenosis  CKD IV CAD BPH S/p TURP GERD Vascular dementia   Significant Hospital Events: Including procedures, antibiotic start and stop dates in addition to other pertinent events     Interim History / Subjective:  Palliative care was consulted today  Had an increased O2 requirement   Objective    Blood pressure 102/60, pulse 88, temperature (!) 97.5 F (36.4 C), temperature source Axillary, resp. rate (!) 25, height 5' 11 (1.803 m), weight 74.5 kg, SpO2 95%.        Intake/Output Summary (Last 24 hours) at 03/23/2024 1657 Last data filed at 03/23/2024 1654 Gross per 24 hour  Intake 3193.78 ml  Output 600 ml  Net 2593.78 ml   Filed Weights   03/21/24 0500 03/22/24 0500 03/23/24 0411  Weight: 101.2 kg 72.9 kg 74.5 kg    Examination: General: chronically and acutely ill M  HENT: Secretions in back of mouth  Lungs: profoundly wet sounds with crackles and rhonchi bilaterally  Cardiovascular: rr  Abdomen: soft. G tube  Neuro: moaning, does not follow commands  GU: defer  Resolved problem list    Assessment and Plan   GOC DNR  Acute hypoxic respiratory failure Aspiration PNA Chronic dysphagia Pulmonary edema Chronic malnutrition G tube in situ  Hx CVA   Hx severe AS, Mitral stenosis, CAD HFmfEF  CKD IV  Acute encephalopathy superimposed on vascular dementia  -I think the patient is likely approaching end of life. Several of his chronic problems are limited in how they can be managed at baseline. I believe we are now also seeing the overlap of some of these conditions and how they can contribute to decline. In addition to this overlap, we are also at a juncture where measures to try to help the patient are also causing negative effects (ie malnutrition, IVF for hypotension, pulm edema). DNR/I is appropriate. Moreover, I would recommend consideration of comfort measures (in chart review it looks like multiple providers have also come to this recommendation) The pt has significant resp sx burden at this time.   -I spoke w pt HCPOA (son Alyce) and wife on the phone and conveyed the above. Wife had very specific questions regarding his O2 requirement, and who specifically turned his O2 up to 5L. I could not speak to who incr the O2, but reiterated that this was appropriate, and went on to discuss again that his respiratory decline is not simply and incr O2 need, but multifactorial. We talked about my recommendation for comfort measures-- including starting sx focussed medications and ceasing invasive measures (labs imaging) and  medications not aimed at comfort. I could not confidently answer son's query for what pts life might be, but do worry it may be as short as days.    -regarding acute management while GOC are being determined, I do not think he can handle more IVF.  -I do not think his BP would handle diuresis -he is on stress dose steroids and midodrine   for his BP  -on unasyn  for aspiration pna  -Titrate O2 for goal > 92  -oral suction as needed  -maintain NPO status, aspiration  precautions    Labs   CBC: Recent Labs  Lab 03/18/24 1320 03/19/24 0326 03/19/24 1413 03/19/24 1919 03/21/24 0336 03/22/24 0414 03/23/24 0348  WBC 14.7* 7.6  --  6.6 6.5 9.9 12.8*  NEUTROABS 13.7*  --   --   --  5.0 8.5* 10.2*  HGB 9.6* 7.7* 7.7* 8.0* 7.8* 7.8* 7.5*  HCT 28.6* 24.3* 23.6* 24.3* 23.3* 24.4* 23.5*  MCV 88.8 92.4  --  91.4 90.3 90.4 92.9  PLT 363 292  --  330 335 365 356    Basic Metabolic Panel: Recent Labs  Lab 03/19/24 1919 03/19/24 2144 03/20/24 0959 03/21/24 0336 03/22/24 0414 03/23/24 0348  NA 126* 127*  --  134* 134* 139  K 4.5 4.7  --  4.5 4.7 5.4*  CL 100 99  --  106 106 110  CO2 11* 10*  --  13* 15* 16*  GLUCOSE 112* 151*  --  189* 381* 175*  BUN 74* 75*  --  76* 78* 83*  CREATININE 2.47* 2.59*  --  2.38* 2.48* 2.55*  CALCIUM  8.1* 8.1*  --  8.2* 8.6* 8.9  MG  --  2.1  --  2.3 2.5* 2.4  PHOS  --   --  4.6 4.2 3.6 4.6   GFR: Estimated Creatinine Clearance: 20.7 mL/min (A) (by C-G formula based on SCr of 2.55 mg/dL (H)). Recent Labs  Lab 03/18/24 1351 03/18/24 1528 03/19/24 1413 03/19/24 1919 03/19/24 2144 03/21/24 0336 03/22/24 0414 03/23/24 0348  PROCALCITON  --    < >  --   --  1.64 0.99 0.68 0.59  WBC  --    < >  --  6.6  --  6.5 9.9 12.8*  LATICACIDVEN 2.0*  --  1.3 1.5  --   --   --   --    < > = values in this interval not displayed.    Liver Function Tests: Recent Labs  Lab 03/18/24 1320 03/21/24 0336 03/22/24 0414 03/23/24 0348  AST 29 21 17 21   ALT 11 16 16 13   ALKPHOS 104 104 105 113  BILITOT 0.5 <0.2 <0.2 <0.2  PROT 7.4 6.1* 6.3* 6.5  ALBUMIN  3.7 3.0* 2.9* 3.3*   No results for input(s): LIPASE, AMYLASE in the last 168 hours. No results for input(s): AMMONIA in the last 168 hours.  ABG    Component Value Date/Time   HCO3 12.3 (L) 03/19/2024 1919   TCO2 22 02/11/2024 1603   ACIDBASEDEF 13.4 (H) 03/19/2024 1919   O2SAT 62.6 03/19/2024 1919     Coagulation Profile: Recent Labs  Lab  03/18/24 1415 03/19/24 0415  INR 1.2 1.2    Cardiac Enzymes: No results for input(s): CKTOTAL, CKMB, CKMBINDEX, TROPONINI in the last 168 hours.  HbA1C: Hgb A1c MFr Bld  Date/Time Value Ref Range Status  03/22/2024 04:14 AM 7.9 (H) 4.8 - 5.6 % Final    Comment:    (NOTE) Diagnosis  of Diabetes The following HbA1c ranges recommended by the American Diabetes Association (ADA) may be used as an aid in the diagnosis of diabetes mellitus.  Hemoglobin             Suggested A1C NGSP%              Diagnosis  <5.7                   Non Diabetic  5.7-6.4                Pre-Diabetic  >6.4                   Diabetic  <7.0                   Glycemic control for                       adults with diabetes.    12/19/2020 07:42 AM 6.5 (H) 4.8 - 5.6 % Final    Comment:    (NOTE) Pre diabetes:          5.7%-6.4%  Diabetes:              >6.4%  Glycemic control for   <7.0% adults with diabetes     CBG: Recent Labs  Lab 03/22/24 2355 03/23/24 0415 03/23/24 0807 03/23/24 1204 03/23/24 1609  GLUCAP 143* 173* 251* 269* 280*    Review of Systems:   Unable to obtain 2/2 encephalopathy   Past Medical History:  He,  has a past medical history of Anemia, Carotid artery occlusion, Cerebral infarction due to occlusion of middle cerebral artery (HCC) (04/01/2020), Chicken pox, Chronic kidney disease, Coronary artery disease, Diabetes mellitus without complication (HCC), Frequent headaches, Glaucoma, Hay fever, History of blood transfusion, HTN (hypertension), Hyperlipidemia, Hyperlipidemia, Hypothyroidism, Migraines, Peripheral vascular disease, 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(HCC), and Stroke-like symptoms.   Surgical History:   Past Surgical History:  Procedure Laterality Date   CHOLECYSTECTOMY, LAPAROSCOPIC     ENDARTERECTOMY Right 12/22/2020   Procedure: RIGHT CAROTID ENDARTERECTOMY;  Surgeon: Eliza Lonni RAMAN, MD;  [facility];  Service: Vascular;   Laterality: Right;   IR GASTROSTOMY TUBE MOD SED  03/29/2020   IR GASTROSTOMY TUBE REMOVAL  03/20/2024   TONSILLECTOMY  04/01/1940   TOTAL HIP ARTHROPLASTY Left 03/08/2020   Procedure: TOTAL HIP ARTHROPLASTY;  Surgeon: Barbarann Oneil BROCKS, MD;  Location: [facility] ORS;  Service: Orthopedics;  Laterality: Left;   TRANSURETHRAL RESECTION OF PROSTATE N/A 12/14/2020   Procedure: TRANSURETHRAL RESECTION OF THE PROSTATE (TURP);  Surgeon: Matilda Senior, MD;  Location: [facility] ORS;  Service: Urology;  Laterality: N/A;  2 HRS     Social History:   reports that he quit smoking about 47 years ago. His smoking use included cigarettes. He started smoking about 56 years ago. He has never used smokeless tobacco. He reports that he does not currently use alcohol . He reports that he does not use drugs.   Family History:  His family history includes Diabetes in his maternal grandfather; Liver disease in his mother; Skin cancer in his father and paternal grandfather.   Allergies Allergies[1]   Home Medications  Prior to Admission medications  Medication Sig Start Date End Date Taking? Authorizing Provider  acetaminophen  (TYLENOL ) 500 MG tablet Take 500 mg by mouth as needed for pain.   Yes [provider]  aspirin  EC 81 MG tablet Take 1 tablet (81 mg total) by  mouth daily. 12/26/20  Yes Singh, Prashant K, MD  atorvastatin  (LIPITOR) 40 MG tablet Take 40 mg by mouth at bedtime.   Yes [provider]  Calcium  Carb-Cholecalciferol  (OYSTER SHELL CALCIUM  W/D) 500-5 MG-MCG TABS Take 1 tablet by mouth daily. 10/31/20  Yes [provider]  carboxymethylcellulose (REFRESH PLUS) 0.5 % SOLN Place 1 drop into both eyes every hour while awake.   Yes [provider]  Ensure (ENSURE) Take 237 mLs by mouth 3 (three) times daily between meals.   Yes [provider]  ferrous sulfate  325 (65 FE) MG tablet Take 1 tablet (325 mg total) by mouth daily with breakfast. 12/26/20  Yes Singh, Prashant K, MD   latanoprost  (XALATAN ) 0.005 % ophthalmic solution Place 1 drop into both eyes at bedtime.  03/13/19  Yes [provider]  Lifitegrast  (XIIDRA ) 5 % SOLN Place 1 drop into both eyes in the morning and at bedtime.   Yes [provider]  loperamide  (IMODIUM ) 2 MG capsule Take 2 mg by mouth as needed for diarrhea or loose stools.   Yes [provider]  methimazole  (TAPAZOLE ) 5 MG tablet Take 5 mg by mouth daily. 04/07/23  Yes [provider]  mupirocin  ointment (BACTROBAN ) 2 % Place 1 Application into the nose 2 (two) times daily. Patient taking differently: Apply 1 Application topically 2 (two) times daily. 06/20/23  Yes Will Almarie MATSU, MD  Nutritional Supplements (FEEDING SUPPLEMENT, OSMOLITE 1.5 CAL,) LIQD Place 237 mLs into feeding tube continuous. 04/03/20  Yes [provider]  Nutritional Supplements (PROSOURCE) LIQD Give 45 mLs by tube in the morning and at bedtime.   Yes [provider]  prednisoLONE  acetate (PRED FORTE ) 1 % ophthalmic suspension Place 1 drop into the left eye daily. 05/13/23  Yes [provider]  SIMBRINZA 1-0.2 % SUSP Place 1 drop into both eyes in the morning and at bedtime.  05/16/19  Yes [provider]  Varenicline  Tartrate 0.03 MG/ACT SOLN Place 1 spray into both nostrils 2 (two) times daily. 06/16/23  Yes [provider]  vitamin B-12 (CYANOCOBALAMIN ) 500 MCG tablet Take 1 tablet by mouth 2 (two) times daily. 01/03/21  Yes [provider]  Water  For Irrigation, Sterile (FREE WATER ) SOLN Place 120 mLs into feeding tube 5 (five) times daily. 04/03/20  Yes Cheryle Page, MD  White Petrolatum -Mineral Oil (TEARS AGAIN) OINT Place 1 application  into both eyes in the morning and at bedtime. Refresh PM   Yes [provider]     Critical care time: na       High mdm    Ronnald Gave MSN, AGACNP-BC [facility] Amion for pager  03/23/2024, 5:48  PM         [1]  Allergies Allergen Reactions   Other Swelling and Other (See Comments)    ALL COUGH MEDICATIONS   Influenza Vaccines Other (See Comments)    Unknown   Robitussin Cold Cough+ Chest [Dextromethorphan -Guaifenesin ] Swelling and Other (See Comments)    Laryngeal edema - any cough syrup; Wife states Robitussin and ANY cough syrup closes patient's throat   "

## 2024-03-23 NOTE — Progress Notes (Signed)
 Dr. Franky notified and new orders were given.   03/23/24 0205  Assess: MEWS Score  BP (!) (S)  87/48  MAP (mmHg) (!) 61  Pulse Rate 99  ECG Heart Rate 100  Resp 20  Assess: MEWS Score  MEWS Temp 0  MEWS Systolic 1  MEWS Pulse 0  MEWS RR 0  MEWS LOC 0  MEWS Score 1  MEWS Score Color Green  Assess: SIRS CRITERIA  SIRS Temperature  0  SIRS Respirations  0  SIRS Pulse 1  SIRS WBC 0  SIRS Score Sum  1

## 2024-03-23 NOTE — Consult Note (Addendum)
 " Palliative Medicine Inpatient Consult Note  Consulting Provider: Dennise Lavada POUR, MD   Reason for consult:   Palliative Care Consult Services Palliative Medicine Consult  Reason for Consult? aspiration, declining rapidly, GOC   03/23/2024  HPI:  Per intake H&P --> John Walton is a 89 y.o. male with medical history significant of hypertension, hyperlipidemia, GERD, diabetes, CKD 4, carotid artery disease, CVA, aortic stenosis, hypothyroidism, glaucoma, BPH status post TURP, anemia, status post G-tube presenting with weakness and cough. Tx for MF PNA. Palliative care requested to discuss goals of care.   Clinical Assessment/Goals of Care:  *Please note that this is a verbal dictation therefore any spelling or grammatical errors are due to the Dragon Medical One system interpretation.  I have reviewed medical records including EPIC notes, labs and imaging, received report from bedside RN, assessed the patient who is lying in bed yelling to let him die due to chest pain.    I met with John Walton and his wife, John Walton to further discuss diagnosis prognosis, GOC, EOL wishes, disposition and options.   I introduced Palliative Medicine as specialized medical care for people living with serious illness. It focuses on providing relief from the symptoms and stress of a serious illness. The goal is to improve quality of life for both the patient and the family.  Medical History Review and Understanding:  A review of John Walton past medical history significant for hypertension, hyperlipidemia, type 2 diabetes, stage IV chronic kidney disease, coronary artery disease, prior stroke, aortic stenosis, glucoma, hypothyroidism, BPH, anemia, previous gastrostomy tube placement was completed.  Social History:  John Walton lives in [place]  he has been here since 2008.  John Walton previously lived in [place] working in an apartment complex on the lock system.  He and his wife John Walton had been  married for 50 years.  They each shared 2 children from prior relationships and have 1 daughter together.  They share 4 grandchildren.  John Walton enjoys being social with his family.  He is a man of faith practicing within Christianity.  Functional and Nutritional State:  Preceding hospitalization John Walton was living with his wife in a single-family home.  They have a son who lives locally.  He was able to do his basic activities of daily living.  He has been able to eat and drink despite his gastrostomy tube placement.  Patient's wife does share over the past 2 weeks though his coughing had increased with ingestion.  Advance Directives:  A detailed discussion was had today regarding advanced directives.  John Walton has completed advanced directives on file.  His surrogate decision maker appears to be his son, John Walton.  Code Status:  Concepts specific to code status, artifical feeding and hydration, continued IV antibiotics and rehospitalization was had.  The difference between a aggressive medical intervention path  and a palliative comfort care path for this patient at this time was had.   John Walton is an established DO NOT RESUSCITATE DO NOT INTUBATE CODE STATUS.  Discussion:  We discussed John Walton previous narrowing of his airway and esophagus due to allergic responses to Robitussin.  This has happened twice in his life and the first time required gastrostomy tube placement as it took many weeks for the swelling to decrease.  Patient's wife endorses typically when he is sick he does have problems with his ability to swallow.  She shares that at this point in time he is having incomplete closure of his epiglottis which is causing aspirational events.  We  discussed in the setting of John Walton age, comorbid conditions, and general frailty that he is at higher risk for dysphagia.  We discussed options moving forward 1 of which would be continuing present measures treatment of his multifocal pneumonia and working  with the speech therapy, physical therapy, Occupational Therapy teams to try to improve strength.  I shared this would be likely a long journey and is unclear if even going through this will result in improvement.  We discussed alternatively focusing on comfort care. We talked about transition to comfort measures in house and what that would entail inclusive of medications to control pain, dyspnea, agitation, nausea, itching, and hiccups.  We discussed stopping all uneccessary measures such as cardiac monitoring, blood draws, needle sticks, and frequent vital signs.   Right now patient's wife endorses that she has felt pressured to transition to one direction versus another.  She has had the time to speak to all of the patient's children and at this point would like to continue present measures.  She understands of a few days from now there is no improvement or there is clinical decline that additional conversations would need to be had.  Utilized reflective listening throughout our time together.   Discussed the importance of continued conversation with family and their  medical providers regarding overall plan of care and treatment options, ensuring decisions are within the context of the patients values and GOCs.  ___________________  Addendum:  I spoke to patients spouse,  John Walton shares concerns about current care which I was able to provide space for her to do.   I have spoke to patients spouse, John Walton this evening as John Walton clinical status has declined. I shared my concerns that he is doing poorly and we are likely encroaching end of life.   I have called patients son, John Walton who is also aware of patients declining state.   Decision Maker: John Walton (son) [phone]  SUMMARY OF RECOMMENDATIONS   DNAR/DNI  Allow a time trial of treatment to see if improvements can be made --> patient's wife understands if patient were to deteriorate or decline further additional conversations  will be warranted  Ideally the hope would be for John Walton to transition to skilled nursing to work with -->  Physical therapy, Occupational Therapy, and Speech therapy for strengthening  Open and honest conversations held in the setting of patient's dysphagia and ongoing risk of aspirational events  The palliative care medicine team will continue to follow along with [place] during hospitalization  Code Status/Advance Care Planning: DNAR/DNI  Palliative Prophylaxis:  Aspiration, Bowel Regimen, Delirium Protocol, Frequent Pain Assessment, Oral Care, Palliative Wound Care, and Turn Reposition  Additional Recommendations (Limitations, Scope, Preferences): Continue present care  Psycho-social/Spiritual:  Desire for further Chaplaincy support: Yes Additional Recommendations: Education on dysphagia and associated risk of recurrent aspirational events   Prognosis: At this point in time worrisome if patient should continue to aspirate  Discharge Planning: Discharge plan to be determined however patient's family would prefer if he goes to skilled nursing for strengthening  Vitals:   03/23/24 0700 03/23/24 0805  BP: 111/63   Pulse: (!) 104   Resp: (!) 22   Temp:  (!) 97.5 F (36.4 C)  SpO2: 93%     Intake/Output Summary (Last 24 hours) at 03/23/2024 0809 Last data filed at 03/23/2024 0517 Gross per 24 hour  Intake 940.25 ml  Output --  Net 940.25 ml   Last Weight  Most recent update: 03/23/2024  4:14 AM  Weight  74.5 kg (164 lb 3.9 oz)             LABS: CBC:    Component Value Date/Time   WBC 12.8 (H) 03/23/2024 0348   HGB 7.5 (L) 03/23/2024 0348   HCT 23.5 (L) 03/23/2024 0348   PLT 356 03/23/2024 0348   MCV 92.9 03/23/2024 0348   NEUTROABS 10.2 (H) 03/23/2024 0348   LYMPHSABS 0.5 (L) 03/23/2024 0348   MONOABS 1.0 03/23/2024 0348   EOSABS 0.1 03/23/2024 0348   BASOSABS 0.1 03/23/2024 0348   Comprehensive Metabolic Panel:    Component Value Date/Time   NA 139  03/23/2024 0348   K 5.4 (H) 03/23/2024 0348   CL 110 03/23/2024 0348   CO2 16 (L) 03/23/2024 0348   BUN 83 (H) 03/23/2024 0348   CREATININE 2.55 (H) 03/23/2024 0348   GLUCOSE 175 (H) 03/23/2024 0348   CALCIUM  8.9 03/23/2024 0348   AST 21 03/23/2024 0348   ALT 13 03/23/2024 0348   ALKPHOS 113 03/23/2024 0348   BILITOT <0.2 03/23/2024 0348   PROT 6.5 03/23/2024 0348   ALBUMIN  3.3 (L) 03/23/2024 0348    Gen: Elderly Caucasian male chronically ill in appearance HEENT: Dry mucous membranes CV: Regular rate and rhythm PULM: On 2 L nasal cannula breathing is even nonlabored ABD: soft/nontender EXT: No edema Neuro: John Walton is hard of hearing though aware of self and that we are in the hospital  PPS: 30%   This conversation/these recommendations were discussed with patient primary care team, Dr. Dennise ______________________________________________________ Rosaline Becton [facility] Palliative Medicine Team Team Cell Phone: [phone] Please utilize secure chat with additional questions, if there is no response within 30 minutes please call the above phone number  Total Time: 75 Billing based on MDM: High  Palliative Medicine Team providers are available by phone from 7am to 7pm daily and can be reached through the team cell phone.  Should this patient require assistance outside of these hours, please call the patient's attending physician.  "

## 2024-03-23 NOTE — Progress Notes (Signed)
 "   DAILY PROGRESS NOTE   Patient Name: John Walton Date of Encounter: 03/23/2024 Cardiologist: Oneil Parchment, MD  Chief Complaint   Intermittent chest pain  Patient Profile   John Walton is a 89 y.o. male with a hx of AS, mitral stenosis, debility who is being seen 03/18/2024 for the evaluation of elevated troponin at the request of Dr. Dasie.   Subjective   Patient seen in consult by Dr. Parchment on 12/18. Noted to have moderate MS and severe AS with elevated troponin likely secondary to metabolic derangement/demand ischemia. He was not felt to be a surgical candidate. LVEF on echo was 40-45%. Cardiology signed-off. Asked to re-consult regarding any change in recommendations about managing valvular heart disease. Since we saw him last, there is noted aspiration and some progressive decline. He has reported intermittent chest pain. Troponin flat elevated today at 257 -> 252.  GFR 23.  Objective   Vitals:   03/23/24 1045 03/23/24 1100 03/23/24 1200 03/23/24 1209  BP: (!) 79/49 (!) 80/48 (!) 81/46 (!) 79/47  Pulse: 89 89 87 86  Resp: 13 14 15 12   Temp:      TempSrc:      SpO2: 94% 95% 94% 94%  Weight:      Height:        Intake/Output Summary (Last 24 hours) at 03/23/2024 1238 Last data filed at 03/23/2024 1100 Gross per 24 hour  Intake 2380.6 ml  Output 450 ml  Net 1930.6 ml   Filed Weights   03/21/24 0500 03/22/24 0500 03/23/24 0411  Weight: 101.2 kg 72.9 kg 74.5 kg    Physical Exam   General appearance: alert, cachectic, and pale Neck: no carotid bruit, no JVD, and thyroid  not enlarged, symmetric, no tenderness/mass/nodules Lungs: diminished breath sounds bibasilar and rhonchi bilaterally Heart: regular rate and rhythm, S1, S2 normal, and systolic murmur: late systolic 3/6, crescendo at 2nd right intercostal space Extremities: extremities normal, atraumatic, no cyanosis or edema Neurologic: Mental status: responds to voice, HOH  Inpatient Medications     Scheduled Meds:  artificial tears   Both Eyes BID   aspirin   81 mg Per Tube Daily   atorvastatin   40 mg Per Tube QHS   bacitracin    Topical TID   brinzolamide   1 drop Both Eyes BID   And   brimonidine   1 drop Both Eyes BID   cyanocobalamin   500 mcg Per Tube BID   free water   150 mL Per Tube Q6H   hydrocortisone  sod succinate (SOLU-CORTEF ) inj  100 mg Intravenous Q8H   insulin  aspart  0-9 Units Subcutaneous Q4H   insulin  glargine  10 Units Subcutaneous Daily   latanoprost   1 drop Both Eyes QHS   [START ON 03/21/2024] methimazole   5 mg Per Tube Daily   midodrine   10 mg Per Tube TID WC   prednisoLONE  acetate  1 drop Left Eye Daily   sodium chloride  flush  3 mL Intravenous Q12H   sodium zirconium cyclosilicate   10 g Per Tube BID   thiamine   100 mg Per Tube Daily    Continuous Infusions:  albumin  human     ampicillin -sulbactam (UNASYN ) IV 3 g (03/23/24 1007)   feeding supplement (GLUCERNA 1.5 CAL) 45 mL/hr at 03/23/24 1031   lactated ringers       PRN Meds: acetaminophen  **OR** acetaminophen , chlorpheniramine-HYDROcodone , ipratropium-albuterol , metoprolol  tartrate, morphine  injection, ondansetron  (ZOFRAN ) IV   Labs   Results for orders placed or performed during the hospital encounter of 03/18/24 (  from the past 48 hours)  Glucose, capillary     Status: Abnormal   Collection Time: 03/21/24  3:16 PM  Result Value Ref Range   Glucose-Capillary 276 (H) 70 - 99 mg/dL    Comment: Glucose reference range applies only to samples taken after fasting for at least 8 hours.  Glucose, capillary     Status: Abnormal   Collection Time: 03/21/24  5:07 PM  Result Value Ref Range   Glucose-Capillary 277 (H) 70 - 99 mg/dL    Comment: Glucose reference range applies only to samples taken after fasting for at least 8 hours.  Glucose, capillary     Status: Abnormal   Collection Time: 03/21/24  8:09 PM  Result Value Ref Range   Glucose-Capillary 325 (H) 70 - 99 mg/dL    Comment: Glucose  reference range applies only to samples taken after fasting for at least 8 hours.  Glucose, capillary     Status: Abnormal   Collection Time: 03/22/24 12:05 AM  Result Value Ref Range   Glucose-Capillary 361 (H) 70 - 99 mg/dL    Comment: Glucose reference range applies only to samples taken after fasting for at least 8 hours.  Procalcitonin     Status: None   Collection Time: 03/22/24  4:14 AM  Result Value Ref Range   Procalcitonin 0.68 ng/mL    Comment: (NOTE)   Sepsis PCT Algorithm          Lower Respiratory Tract Infection                                         PCT Algorithm -----------------------------------------------------------------  <0.5 ng/mL                    <0.10 ng/mL  Associated with low           Antibiotic therapy strongly   risk for progression          discouraged. Indicates absence   to severe sepsis              of bacteria infection  and/or septic shock             --------------------------------------------------------------  0.5-2.0 ng/mL                 0.10-0.25 ng/mL  Recommended to retest         Antibiotic therapy discouraged.  PCT within 6-24 hours         Bacterial infection unlikely  ------------------------------------------------------------  >2 ng/mL                      0.26-0.50 ng/mL  Associated with high risk     Antibiotic therapy encouraged.  for progression to severe     Bacterial infection possible  sepsis/and or septic shock    ------------------------------                                 >0.50 ng/mL                                Antibiotic therapy strongly  encouraged.                                Suggestive of presence of                                 bacterial infection.                                 -------------------------------------------------------------------  < or = 0.50 ng/mL OR          < or = 0.25 OR 80% decrease in PCT  80% decrease in PCT           Antibiotic therapy    Antibiotic therapy may        may be discontinued  be discontinued                                 Performed at [facility], [address] [place], [place] [address]   Phosphorus     Status: None   Collection Time: 03/22/24  4:14 AM  Result Value Ref Range   Phosphorus 3.6 2.5 - 4.6 mg/dL    Comment: Performed at [facility], [address] [place], [place] [address]  Magnesium      Status: Abnormal   Collection Time: 03/22/24  4:14 AM  Result Value Ref Range   Magnesium  2.5 (H) 1.7 - 2.4 mg/dL    Comment: Performed at [facility], [address], [place], [place] [address]  C-reactive protein     Status: Abnormal   Collection Time: 03/22/24  4:14 AM  Result Value Ref Range   CRP 12.2 (H) <1.0 mg/dL    Comment: Performed at [facility], [address], [place], [place] [address]  Comprehensive metabolic panel with GFR     Status: Abnormal   Collection Time: 03/22/24  4:14 AM  Result Value Ref Range   Sodium 134 (L) 135 - 145 mmol/L   Potassium 4.7 3.5 - 5.1 mmol/L   Chloride 106 98 - 111 mmol/L   CO2 15 (L) 22 - 32 mmol/L   Glucose, Bld 381 (H) 70 - 99 mg/dL    Comment: Glucose reference range applies only to samples taken after fasting for at least 8 hours.   BUN 78 (H) 8 - 23 mg/dL   Creatinine, Ser 7.51 (H) 0.61 - 1.24 mg/dL   Calcium  8.6 (L) 8.9 - 10.3 mg/dL   Total Protein 6.3 (L) 6.5 - 8.1 g/dL   Albumin  2.9 (L) 3.5 - 5.0 g/dL   AST 17 15 - 41 U/L   ALT 16 0 - 44 U/L   Alkaline Phosphatase 105 38 - 126 U/L   Total Bilirubin <0.2 0.0 - 1.2 mg/dL   GFR, Estimated 24 (L) >60 mL/min    Comment: (NOTE) Calculated using the CKD-EPI Creatinine Equation (2021)    Anion gap 13 5 - 15    Comment: Performed at [facility], [address] [place], [place] [address]  CBC with Differential/Platelet     Status: Abnormal   Collection Time: 03/22/24  4:14 AM  Result Value Ref Range   WBC 9.9 4.0 - 10.5 K/uL   RBC 2.70 (L)  4.22 - 5.81 MIL/uL    Hemoglobin 7.8 (L) 13.0 - 17.0 g/dL   HCT 75.5 (L) 60.9 - 47.9 %   MCV 90.4 80.0 - 100.0 fL   MCH 28.9 26.0 - 34.0 pg   MCHC 32.0 30.0 - 36.0 g/dL   RDW 86.0 88.4 - 84.4 %   Platelets 365 150 - 400 K/uL   nRBC 0.2 0.0 - 0.2 %   Neutrophils Relative % 86 %   Neutro Abs 8.5 (H) 1.7 - 7.7 K/uL   Lymphocytes Relative 6 %   Lymphs Abs 0.6 (L) 0.7 - 4.0 K/uL   Monocytes Relative 4 %   Monocytes Absolute 0.4 0.1 - 1.0 K/uL   Eosinophils Relative 4 %   Eosinophils Absolute 0.4 0.0 - 0.5 K/uL   Basophils Relative 0 %   Basophils Absolute 0.0 0.0 - 0.1 K/uL   WBC Morphology See Note     Comment:  Morphology unremarkable   RBC Morphology MORPHOLOGY UNREMARKABLE     Comment:  Morphology unremarkable   Smear Review See Note     Comment:  Normal Platelet Morphology Performed at [facility], [address], [place], [place] [address]   Hemoglobin A1c     Status: Abnormal   Collection Time: 03/22/24  4:14 AM  Result Value Ref Range   Hgb A1c MFr Bld 7.9 (H) 4.8 - 5.6 %    Comment: (NOTE) Diagnosis of Diabetes The following HbA1c ranges recommended by the American Diabetes Association (ADA) may be used as an aid in the diagnosis of diabetes mellitus.  Hemoglobin             Suggested A1C NGSP%              Diagnosis  <5.7                   Non Diabetic  5.7-6.4                Pre-Diabetic  >6.4                   Diabetic  <7.0                   Glycemic control for                       adults with diabetes.     Mean Plasma Glucose 180.03 mg/dL    Comment: Performed at [facility], [address], [place], [place] [address]  Glucose, capillary     Status: Abnormal   Collection Time: 03/22/24  5:26 AM  Result Value Ref Range   Glucose-Capillary 367 (H) 70 - 99 mg/dL    Comment: Glucose reference range applies only to samples taken after fasting for at least 8 hours.  Glucose, capillary     Status: Abnormal   Collection Time: 03/22/24  7:51 AM  Result Value Ref  Range   Glucose-Capillary 385 (H) 70 - 99 mg/dL    Comment: Glucose reference range applies only to samples taken after fasting for at least 8 hours.  Glucose, capillary     Status: Abnormal   Collection Time: 03/22/24 11:45 AM  Result Value Ref Range   Glucose-Capillary 277 (H) 70 - 99 mg/dL    Comment: Glucose reference range applies only to samples taken after fasting for at least 8 hours.  Glucose, capillary     Status: Abnormal   Collection Time:  03/22/24  4:04 PM  Result Value Ref Range   Glucose-Capillary 160 (H) 70 - 99 mg/dL    Comment: Glucose reference range applies only to samples taken after fasting for at least 8 hours.  Glucose, capillary     Status: Abnormal   Collection Time: 03/22/24  8:19 PM  Result Value Ref Range   Glucose-Capillary 142 (H) 70 - 99 mg/dL    Comment: Glucose reference range applies only to samples taken after fasting for at least 8 hours.  Glucose, capillary     Status: Abnormal   Collection Time: 03/22/24 11:55 PM  Result Value Ref Range   Glucose-Capillary 143 (H) 70 - 99 mg/dL    Comment: Glucose reference range applies only to samples taken after fasting for at least 8 hours.  Procalcitonin     Status: None   Collection Time: 03/23/24  3:48 AM  Result Value Ref Range   Procalcitonin 0.59 ng/mL    Comment: (NOTE)   Sepsis PCT Algorithm          Lower Respiratory Tract Infection                                         PCT Algorithm -----------------------------------------------------------------  <0.5 ng/mL                    <0.10 ng/mL  Associated with low           Antibiotic therapy strongly   risk for progression          discouraged. Indicates absence   to severe sepsis              of bacteria infection  and/or septic shock             --------------------------------------------------------------  0.5-2.0 ng/mL                 0.10-0.25 ng/mL  Recommended to retest         Antibiotic therapy discouraged.  PCT within 6-24 hours          Bacterial infection unlikely  ------------------------------------------------------------  >2 ng/mL                      0.26-0.50 ng/mL  Associated with high risk     Antibiotic therapy encouraged.  for progression to severe     Bacterial infection possible  sepsis/and or septic shock    ------------------------------                                 >0.50 ng/mL                                Antibiotic therapy strongly                                 encouraged.                                Suggestive of presence of  bacterial infection.                                 -------------------------------------------------------------------  < or = 0.50 ng/mL OR          < or = 0.25 OR 80% decrease in PCT  80% decrease in PCT           Antibiotic therapy   Antibiotic therapy may        may be discontinued  be discontinued                                 Performed at [facility], [address] [place], [place] [address]   Phosphorus     Status: None   Collection Time: 03/23/24  3:48 AM  Result Value Ref Range   Phosphorus 4.6 2.5 - 4.6 mg/dL    Comment: Performed at [facility], [address], [place], [place] [address]  Magnesium      Status: None   Collection Time: 03/23/24  3:48 AM  Result Value Ref Range   Magnesium  2.4 1.7 - 2.4 mg/dL    Comment: Performed at [facility], [address] [place], [place] [address]  C-reactive protein     Status: Abnormal   Collection Time: 03/23/24  3:48 AM  Result Value Ref Range   CRP 11.4 (H) <1.0 mg/dL    Comment: Performed at [facility], [address], [place], [place] [address]  Comprehensive metabolic panel with GFR     Status: Abnormal   Collection Time: 03/23/24  3:48 AM  Result Value Ref Range   Sodium 139 135 - 145 mmol/L   Potassium 5.4 (H) 3.5 - 5.1 mmol/L   Chloride 110 98 - 111 mmol/L   CO2 16 (L) 22 - 32 mmol/L   Glucose, Bld 175 (H) 70 - 99 mg/dL     Comment: Glucose reference range applies only to samples taken after fasting for at least 8 hours.   BUN 83 (H) 8 - 23 mg/dL   Creatinine, Ser 7.44 (H) 0.61 - 1.24 mg/dL   Calcium  8.9 8.9 - 10.3 mg/dL   Total Protein 6.5 6.5 - 8.1 g/dL   Albumin  3.3 (L) 3.5 - 5.0 g/dL   AST 21 15 - 41 U/L   ALT 13 0 - 44 U/L   Alkaline Phosphatase 113 38 - 126 U/L   Total Bilirubin <0.2 0.0 - 1.2 mg/dL   GFR, Estimated 23 (L) >60 mL/min    Comment: (NOTE) Calculated using the CKD-EPI Creatinine Equation (2021)    Anion gap 13 5 - 15    Comment: Performed at [facility], [address] [place], [place] [address]  CBC with Differential/Platelet     Status: Abnormal   Collection Time: 03/23/24  3:48 AM  Result Value Ref Range   WBC 12.8 (H) 4.0 - 10.5 K/uL   RBC 2.53 (L) 4.22 - 5.81 MIL/uL   Hemoglobin 7.5 (L) 13.0 - 17.0 g/dL   HCT 76.4 (L) 60.9 - 47.9 %   MCV 92.9 80.0 - 100.0 fL   MCH 29.6 26.0 - 34.0 pg   MCHC 31.9 30.0 - 36.0 g/dL   RDW 85.5 88.4 - 84.4 %   Platelets 356 150 - 400 K/uL   nRBC 0.4 (H) 0.0 - 0.2 %  Neutrophils Relative % 80 %   Neutro Abs 10.2 (H) 1.7 - 7.7 K/uL   Lymphocytes Relative 4 %   Lymphs Abs 0.5 (L) 0.7 - 4.0 K/uL   Monocytes Relative 8 %   Monocytes Absolute 1.0 0.1 - 1.0 K/uL   Eosinophils Relative 1 %   Eosinophils Absolute 0.1 0.0 - 0.5 K/uL   Basophils Relative 0 %   Basophils Absolute 0.1 0.0 - 0.1 K/uL   WBC Morphology MORPHOLOGY UNREMARKABLE    RBC Morphology MORPHOLOGY UNREMARKABLE    Smear Review Normal platelet morphology    Immature Granulocytes 7 %   Abs Immature Granulocytes 0.86 (H) 0.00 - 0.07 K/uL    Comment: Performed at [facility], [address], [place], [place] [address]  Glucose, capillary     Status: Abnormal   Collection Time: 03/23/24  4:15 AM  Result Value Ref Range   Glucose-Capillary 173 (H) 70 - 99 mg/dL    Comment: Glucose reference range applies only to samples taken after fasting for at least 8 hours.   Troponin T, High Sensitivity     Status: Abnormal   Collection Time: 03/23/24  7:55 AM  Result Value Ref Range   Troponin T High Sensitivity 257 (HH) 0 - 19 ng/L    Comment: Critical value noted. Value is consistent with previously reported and called value  (NOTE) Biotin concentrations > 1000 ng/mL falsely decrease TnT results.  Serial cardiac troponin measurements are suggested.  Refer to the Links section for chest pain algorithms and additional  guidance. Performed at [facility], [address] [place], [place] [address]   Glucose, capillary     Status: Abnormal   Collection Time: 03/23/24  8:07 AM  Result Value Ref Range   Glucose-Capillary 251 (H) 70 - 99 mg/dL    Comment: Glucose reference range applies only to samples taken after fasting for at least 8 hours.  Troponin T, High Sensitivity     Status: Abnormal   Collection Time: 03/23/24  9:54 AM  Result Value Ref Range   Troponin T High Sensitivity 252 (HH) 0 - 19 ng/L    Comment: Critical value noted. Value is consistent with previously reported and called value  (NOTE) Biotin concentrations > 1000 ng/mL falsely decrease TnT results.  Serial cardiac troponin measurements are suggested.  Refer to the Links section for chest pain algorithms and additional  guidance. Performed at [facility], [address], [place], [place] [address]   Glucose, capillary     Status: Abnormal   Collection Time: 03/23/24 12:04 PM  Result Value Ref Range   Glucose-Capillary 269 (H) 70 - 99 mg/dL    Comment: Glucose reference range applies only to samples taken after fasting for at least 8 hours.    ECG   Sinus tach with RBBB at 104 - Personally Reviewed  Telemetry   Sinus tachycardia - Personally Reviewed  Radiology    DG Chest Port 1 View Result Date: 03/23/2024 CLINICAL DATA:  Shortness of breath. EXAM: PORTABLE CHEST 1 VIEW COMPARISON:  03/20/2024 FINDINGS: Asymmetric patchy relatively diffuse airspace  disease in both lungs is similar to prior. The cardio pericardial silhouette is enlarged. Telemetry leads overlie the chest. IMPRESSION: No substantial interval change. Asymmetric patchy airspace disease in both lungs. Electronically Signed   By: Camellia Candle M.D.   On: 03/23/2024 07:56    Cardiac Studies   N/A  Assessment   Principal Problem:   Hyponatremia Active Problems:  Essential hypertension   Hyperlipidemia   Normocytic anemia   DM (diabetes mellitus), type 2 (HCC)   Weakness   BPH with obstruction/lower urinary tract symptoms   S/P TURP (status post transurethral resection of prostate)   Symptomatic stenosis of right carotid artery   Gastro-esophageal reflux disease without esophagitis   Nonrheumatic aortic (valve) stenosis   Acute renal failure superimposed on stage 4 chronic kidney disease (HCC)   History of CVA (cerebrovascular accident)   CAP (community acquired pneumonia)   Vascular dementia (HCC)   Acute respiratory failure with hypoxia (HCC)   Plan   John Walton has advanced valvular heart disease with severe symptomatic aortic stenosis. He is not a candidate for valve procedure or cardiac intervention. His low blood pressure would preclude medical therapy for angina. Fotunately, he tells me he is not currently having chest pain. His prognosis is poor and I would recommend progression to comfort approach. Could use opiates to control chest pain.  No further suggestions at this time. Cardiology will sign-off.  [facility] will sign off.   Medication Recommendations:  none Other recommendations (labs, testing, etc):  none Follow up as an outpatient:  none  Time Spent Directly with Patient:  I have spent a total of 25 minutes with the patient reviewing hospital notes, telemetry, EKGs, labs and examining the patient as well as establishing an assessment and plan that was discussed personally with the patient.  > 50% of time was spent in direct patient  care.  Length of Stay:  LOS: 5 days   John KYM Maxcy, MD, [facility], FNLA, FACP  [place]  [facility]  Medical Director of the Advanced Lipid Disorders &  Cardiovascular Risk Reduction Clinic Diplomate of the American Board of Clinical Lipidology Attending Cardiologist  Direct Dial: [phone]  Fax: [phone]  Website:  [email or web address] John Walton 03/23/2024, 12:38 PM   "

## 2024-03-23 NOTE — Progress Notes (Signed)
 Pt has received 6d of azith/ceftriaxone . He may have aspirated. Dr Dennise requested to change abx to Unasyn  for another 5d.  Sergio Batch, PharmD, BCIDP, AAHIVP, CPP Infectious Disease Pharmacist 03/23/2024 9:29 AM

## 2024-03-23 NOTE — Progress Notes (Signed)
 PT Cancellation Note  Patient Details Name: KAREN KINNARD MRN: [id number] DOB: 1932-08-20   Cancelled Treatment:     Pt with aspiration event yesterday. Per discussion with RN, pt and family waiting to meet with Palliative for possible GOC discussion. RN requesting PT hold at this time. Will follow up later today if able or next date as appropriate.   Sabra Morel, PT, DPT  Acute Rehabilitation Services         Office: [phone]      Sabra MARLA Morel 03/23/2024, 11:54 AM

## 2024-03-23 NOTE — Progress Notes (Signed)
 This RN was called to patient's room for c/o CP at 0630. Patient rated pain 10/10 and described pain as sharp and stabbing. This RN secure chatted Dr. Franky. Received new orders to get EKG and to have Troponin lab collected. Patient's potassium noted to elevated and new order to give Lokelma  via Peg. This RN gave Lokelma  per MD order. Dr. Franky also came to [facility] to see patient. Patient repositioned and bed was low and locked, side rails up x3 and bed alarm on. Patient's wife at [facility].

## 2024-03-23 NOTE — TOC Progression Note (Signed)
 Transition of Care [facility]) - Progression Note    Patient Details  Name: John Walton MRN: [id number] Date of Birth: March 19, 1933  [facility] CM/SW Contact  Inocente GORMAN Kindle, LCSW Phone Number: 03/23/2024, 3:37 PM  Clinical Narrative:    CSW received call from patient's wife. CSW updated her that [facility] cannot offer a bed. She is requesting [facility] as their second choice. Will continue to follow for ability to tolerate therapy.    Expected Discharge Plan: Skilled Nursing Facility Barriers to Discharge: Continued Medical Work up, SNF Pending bed offer               Expected Discharge Plan and Services In-house Referral: Clinical Social Work   Post Acute Care Choice: Skilled Nursing Facility Living arrangements for the past 2 months: Single Family Home                                       Social Drivers of Health (SDOH) Interventions SDOH Screenings   Food Insecurity: No Food Insecurity (03/19/2024)  Housing: Low Risk (03/19/2024)  Transportation Needs: No Transportation Needs (03/19/2024)  Utilities: Not At Risk (03/19/2024)  Social Connections: Socially Isolated (03/19/2024)  Tobacco Use: Medium Risk (03/19/2024)    Readmission Risk Interventions    03/22/2024    1:32 PM  Readmission Risk Prevention Plan  Transportation Screening Complete  Medication Review (RN Care Manager) Complete  PCP or Specialist appointment within 3-5 days of discharge Complete  HRI or Home Care Consult Complete  SW Recovery Care/Counseling Consult Complete  Palliative Care Screening Not Applicable  Skilled Nursing Facility Complete

## 2024-03-24 LAB — CULTURE, BLOOD (ROUTINE X 2)
Culture: NO GROWTH
Special Requests: ADEQUATE

## 2024-04-01 NOTE — Death Summary Note (Signed)
 "                                                                                                                                                                                                                                                                        Death summary note  John Walton, is a 89 y.o. male, DOB - 1932/08/31, FMW:[id number]  Admit date - 03/22/24   Admitting Physician Marsa KATHEE Scurry, MD  Outpatient Primary MD for the patient is Waymond Ryan Askew, MD  LOS - 6  Chief Complaint  Patient presents with   Weakness       Notification: Waymond Ryan Askew, MD notified of death of 04-02-24   Admit Date:  22-Mar-2024  Date of Death: Date of Death: Mar 28, 2024  Time of Death: Time of Death: 0355  Length of Stay: 6    Pronounced by - RN  History of present illness:   John Walton is a 89 y.o. male  elderly and frail, has been PEG tube dependent for feeds for several years, intermittent oral intake, now aspirating on oral intake, has known history of EF around 40% with severe aortic stenosis, overall poor baseline, poor long-term prognosis, he is DNR.  Palliative care following.  Spite appropriate treatment patient continued to decline and had a event of aspiration pneumonia while in the hospital and working with speech therapist, was extremely short of breath and a lot of respiratory distress, case was discussed with patient's wife and primary decision-maker his stepson.  He was transition to comfort measures and full comfort care on 27-Mar-2024.  Passed away on 2024-03-28 early morning.   Final Diagnoses:  Cause of death -   Pneumonia  Signature  -    Lavada Stank M.D on 2024/04/02 at 9:26 AM   -  To page go to [email or web address]   Total clinical and documentation time for today Under 30 minutes   Last Note    PROGRESS NOTE    VYNCENT OVERBY  FMW:[id number] DOB: [birth year] DOA: 03/22/24 PCP: Waymond Ryan Askew, MD  Chief Complaint  Patient presents with    Weakness    Brief Narrative:   John Walton is a 89 y.o. male with medical history significant of  hypertension, hyperlipidemia, GERD, diabetes, CKD 4, carotid artery disease, CVA, aortic stenosis, hypothyroidism, glaucoma, BPH status post TURP, anemia, status post G-tube presenting with weakness and cough.   Admitted for multifocal pneumonia and metabolic abnormalities.    Assessment & Plan:   Note patient is overall quite elderly and frail, has been PEG tube dependent for feeds for several years, intermittent oral intake, now aspirating on oral intake, has known history of EF around 40% with severe aortic stenosis, overall poor baseline, poor long-term prognosis, he is DNR.  Palliative care following.  Has been explained to the wife that patient has poor long-term prognosis, she is thinking about goals of care.    Hyponatremia Hyperkalemia Elevated gap metabolic acidosis AKI on CKD 4  Due to dehydration improving with IV fluids, patient has history of consuming less fluids and not drinking enough water , dependent on free water  flushes.  Renal ultrasound stable, continue to monitor electrolytes and renal function.  Pneumonia Acute respiratory failure with hypoxia CXR with multifocal pneumonia, trace R effusion Negative covid, flu, RSV Urine strep, urine legionella, sputum culture Initially improved on Rocephin  azithromycin  however looks like he has aspirated again while working with SLP on 03/22/2024, n.p.o. by mouth, check chest x-ray, switch to Unasyn  for 5 days and monitor. Encouraged to sit in chair use I-S and flutter valve for pulmonary toiletry, advance activity and titrate down oxygen.  Note patient also developing coarse B sounds, hypotension, likely getting septic post aspiration, Unasyn , Steroids, midodrine , albumin  & IVF >> explained to the wife, since frail >> thrid spacing of fluids will occur, she wants to continue for now.  Addendum - now developing rales and  hypoxia, hold TF, IVF, gentle lasix  - prognosis +++ poor, explained clearly to the wife x 3. Note yesterday she wanted comfort care but today wants to hold off for now.  Note per care team patient's [name] is Alyce his step son [phone].  - called, he agrees with DNR, Med Rx, will think about comfort if declines further, he will talk to the family (talk again in 30 mins) , wife requests PCCM input, will get, called Alyce again in 30 mins >> 40 mins, no response.   Final DW sean and wife 7.00 pm - Full comfort care now.      Chronic anemia Downtrending in setting of receiving IVF Will trend  Status post G-tube History of dysphagia Allergy to cough medications > Patient is status post G-tube years ago per family; when he had an allergic reaction to cough medicine causing narrowing of his airway/esophagus.  Was on this for some time and then had improvement was able to swallow better. > Has had intermittent issues with dysphagia and has had aspiration pneumonia in the past and so has maintained G-tube in place to use for supplemental nutrition or as needed. > Swallow study was performed in October of this year and recommendation was for p.o. diet, regular.  Thin liquids.  Medication to be administered by alternative means. - Have speech therapy see him to advance his oral diet as appropriate, there is some redness around the PEG tube site for which IR is consulted, tube actually had malfunctioned with the balloon likely punctured, tube was exchanged on 03/20/2024, now functioning well, overall stable continue Topical cream bacitracin  around the tube site for some local irritation.   Troponin elevation Appreciate cards assistance, thought due demand in the setting of pneumonia, metabolic derangement, lactic acidosis, AKI - no further cardiac   Vascular dementia Hard  of Hearing (speak loudly into his L ear) > Family reports patient had a gradual decline in his mental status especially over  the past 6 months to a year.   Delirium precautions   Severe Aortic stenosis Mitral stenosis HFmrEF  Low normal RVSF > Echo in October showed moderate mitral stenosis and severe aortic stenosis with EF 40-45%. Caution with IVF, developed chest pain overnight 03/23/2024, EKG nonacute, troponin mildly elevated but equal to the levels that he had few days ago, trend is not in ACS pattern, he is not a candidate for invasive procedures, already on aspirin  and statin, blood pressure too low for beta-blocker.  Will request cardiology to opine, patient will benefit from medical treatment, if declines transition to comfort, this has been discussed with wife in detail on multiple occasions by me.   Hyperthyroidism methimazole    Hyperlipidemia - Continue home atorvastatin    Carotid artery disease History of CVA - Continue home ASA, atorvastatin    Glaucoma  - Continue eyedrops  Diabetes - Sugars in poor control on SSI, add Lantus , dietitian requested to change tube feeds.  Check A1c.   CBG (last 3)  No results for input(s): GLUCAP in the last 72 hours.  Lab Results  Component Value Date   HGBA1C 7.9 (H) 03/22/2024       DVT prophylaxis: SCD Code Status: DNR Family Communication: Updated wife [phone]  in detail on 03/20/2024, 03/21/24, 03/22/2024, 03/23/2024 Disposition:   Status is: Inpatient Remains inpatient appropriate because: need for continued inpatient care   Consultants:  IR, palliative care, cardiology  Procedures:  Echocardiogram ordered 03/23/2024  Subjective:  Patient in bed, having some intermittent chest pain, says it is on the left side near his upper chest wall, no radiation, no aggravating relieving factors, no abdominal pain or nausea.  He thinks he is dying,   Objective: Vitals:   03/23/24 1700 03/23/24 1800 03/23/24 2012 April 02, 2024 0119  BP: (!) 93/48 (!) 97/56    Pulse: 90 86    Resp: 20 (!) 22    Temp: 98.3 F (36.8 C)     TempSrc: Axillary      SpO2: 91% 91% 93% 92%  Weight:      Height:       No intake or output data in the 24 hours ending 03/29/24 0926   Filed Weights   03/21/24 0500 03/22/24 0500 03/23/24 0411  Weight: 101.2 kg 72.9 kg 74.5 kg    Examination:  Awake Alert, No new F.N deficits, Normal affect, ++ hearing loss, PEG in place some redness and discharge around the site [place].AT,PERRAL Supple Neck, No JVD,   Symmetrical Chest wall movement, Good air movement bilaterally, Coarse B sounds RRR,No Gallops, Rubs or new Murmurs,  +ve B.Sounds, Abd Soft, No tenderness,   No Cyanosis, Clubbing or edema      Data Review:   Patient Lines/Drains/Airways Status     Active Line/Drains/Airways     Name Placement date Placement time Site Days   Peripheral IV 03/18/24 20 G Left Antecubital 03/18/24  1327  Antecubital  11   Peripheral IV 03/18/24 22 G 1.75 Anterior;Distal;Left Forearm 03/18/24  1516  Forearm  11   Gastrostomy/Enterostomy Gastrostomy 20 Fr. LUQ 03/29/20  1223  LUQ  1461           Inpatient Medications  Scheduled Meds:   Continuous Infusions:   PRN Meds:.  DVT Prophylaxis     Recent Labs  Lab 03/23/24 0348  WBC 12.8*  HGB 7.5*  HCT 23.5*  PLT 356  MCV 92.9  MCH 29.6  MCHC 31.9  RDW 14.4  LYMPHSABS 0.5*  MONOABS 1.0  EOSABS 0.1  BASOSABS 0.1    Recent Labs  Lab 03/23/24 0348  NA 139  K 5.4*  CL 110  CO2 16*  ANIONGAP 13  GLUCOSE 175*  BUN 83*  CREATININE 2.55*  AST 21  ALT 13  ALKPHOS 113  BILITOT <0.2  ALBUMIN  3.3*  CRP 11.4*  PROCALCITON 0.59  MG 2.4  PHOS 4.6  CALCIUM  8.9      Recent Labs  Lab 03/23/24 0348  CRP 11.4*  PROCALCITON 0.59  MG 2.4  CALCIUM  8.9   Lab Results  Component Value Date   CHOL 114 12/19/2020   HDL 36 (L) 12/19/2020   LDLCALC 58 12/19/2020   LDLDIRECT 137.0 07/11/2014   TRIG 99 12/19/2020   CHOLHDL 3.2 12/19/2020    Lab Results  Component Value Date   HGBA1C 7.9 (H) 03/22/2024   No results for input(s):  TSH, T4TOTAL, FREET4, T3FREE, THYROIDAB in the last 72 hours. No results for input(s): VITAMINB12, FOLATE, FERRITIN, TIBC, IRON, RETICCTPCT in the last 72 hours.  ------------------------------------------------------------------------------------------------------------------ Cardiac Enzymes No results for input(s): CKMB, TROPONINI, MYOGLOBIN in the last 168 hours.  Invalid input(s): CK  Micro Results Recent Results (from the past 240 hours)  Blood Culture (routine x 2)     Status: None   Collection Time: 03/19/24  2:13 PM   Specimen: BLOOD  Result Value Ref Range Status   Specimen Description BLOOD RIGHT ANTECUBITAL  Final   Special Requests   Final    BOTTLES DRAWN AEROBIC AND ANAEROBIC Blood Culture adequate volume   Culture   Final    NO GROWTH 5 DAYS Performed at [facility], [address] [place], [place] [address]    Report Status 2024-04-15 FINAL  Final  MRSA Next Gen by PCR, Nasal     Status: None   Collection Time: 03/19/24  6:01 PM   Specimen: Nasal Mucosa; Nasal Swab  Result Value Ref Range Status   MRSA by PCR Next Gen NOT DETECTED NOT DETECTED Final    Comment: (NOTE) The GeneXpert MRSA Assay (FDA approved for NASAL specimens only), is one component of a comprehensive MRSA colonization surveillance program. It is not intended to diagnose MRSA infection nor to guide or monitor treatment for MRSA infections. Test performance is not FDA approved in patients less than 68 years old. Performed at [facility], [address] [place], [place] [address]     Radiology Reports  No results found.    Signature  -   Lavada Stank M.D on 03/29/2024 at 9:26 AM   -  To page go to [email or web address]     "

## 2024-04-01 NOTE — Progress Notes (Signed)
 At 0217 this RN gave patient morphine  IV for pain, restlessness, and SOB. Also gave some cough meds via PEG for patient's continuous cough.Per MD orders. Wife at [facility]. At 0320 patient's wife called out and requested patient to be cleaned up. This RN and NT cleaned patient's face, changed his gown and repositioned patient. At 0350 this RN noted that patient was in [place] on the monitor. This RN assessed patient while charge nurse, Elspeth, secure chatted Dr. Franky to make him aware. Patient's eyes were closed and was in no distress. At 0355 patient was asystole per tele monitor. This RN confirmed patient had no heart beat via auscultation with stethoscope. Dr. Franky notified.

## 2024-04-01 DEATH — deceased
